# Patient Record
Sex: Female | Born: 1955 | Race: White | Hispanic: No
Health system: Southern US, Academic
[De-identification: ages and names within clinical notes are randomized; demographics above are authoritative.]

## PROBLEM LIST (undated history)

## (undated) ENCOUNTER — Encounter

## (undated) ENCOUNTER — Telehealth: Attending: Geriatric Medicine | Primary: Geriatric Medicine

## (undated) ENCOUNTER — Encounter: Attending: Nurse Practitioner | Primary: Nurse Practitioner

## (undated) ENCOUNTER — Ambulatory Visit: Payer: MEDICARE

## (undated) ENCOUNTER — Telehealth

## (undated) ENCOUNTER — Encounter: Attending: Pulmonary Disease | Primary: Pulmonary Disease

## (undated) ENCOUNTER — Ambulatory Visit
Payer: MEDICARE | Attending: Student in an Organized Health Care Education/Training Program | Primary: Student in an Organized Health Care Education/Training Program

## (undated) ENCOUNTER — Encounter: Attending: Family | Primary: Family

## (undated) ENCOUNTER — Ambulatory Visit

## (undated) ENCOUNTER — Encounter: Attending: Geriatric Medicine | Primary: Geriatric Medicine

## (undated) ENCOUNTER — Encounter: Attending: Medical Oncology | Primary: Medical Oncology

## (undated) ENCOUNTER — Telehealth: Attending: Pharmacist | Primary: Pharmacist

## (undated) ENCOUNTER — Encounter
Attending: Student in an Organized Health Care Education/Training Program | Primary: Student in an Organized Health Care Education/Training Program

## (undated) ENCOUNTER — Ambulatory Visit: Payer: MEDICARE | Attending: Psychiatry | Primary: Psychiatry

## (undated) ENCOUNTER — Encounter: Attending: Psychiatry | Primary: Psychiatry

## (undated) ENCOUNTER — Encounter: Attending: Oncology | Primary: Oncology

## (undated) ENCOUNTER — Telehealth: Attending: Medical Oncology | Primary: Medical Oncology

## (undated) ENCOUNTER — Encounter: Attending: Radiation Oncology | Primary: Radiation Oncology

## (undated) ENCOUNTER — Ambulatory Visit: Payer: MEDICARE | Attending: Physical Medicine & Rehabilitation | Primary: Physical Medicine & Rehabilitation

## (undated) ENCOUNTER — Other Ambulatory Visit

## (undated) ENCOUNTER — Telehealth
Attending: Student in an Organized Health Care Education/Training Program | Primary: Student in an Organized Health Care Education/Training Program

## (undated) ENCOUNTER — Telehealth: Attending: Pulmonary Disease | Primary: Pulmonary Disease

## (undated) ENCOUNTER — Encounter: Attending: Pharmacist | Primary: Pharmacist

## (undated) ENCOUNTER — Telehealth: Attending: Oncology | Primary: Oncology

## (undated) ENCOUNTER — Telehealth: Attending: Radiation Oncology | Primary: Radiation Oncology

## (undated) ENCOUNTER — Ambulatory Visit: Attending: Radiation Oncology | Primary: Radiation Oncology

## (undated) ENCOUNTER — Telehealth: Attending: Adult Health | Primary: Adult Health

## (undated) ENCOUNTER — Ambulatory Visit: Payer: Medicare (Managed Care) | Attending: Medical Oncology | Primary: Medical Oncology

## (undated) ENCOUNTER — Ambulatory Visit: Payer: MEDICARE | Attending: Geriatric Medicine | Primary: Geriatric Medicine

## (undated) ENCOUNTER — Encounter: Attending: Adult Health | Primary: Adult Health

## (undated) ENCOUNTER — Ambulatory Visit: Payer: Medicare (Managed Care)

## (undated) ENCOUNTER — Ambulatory Visit: Payer: MEDICAID

## (undated) ENCOUNTER — Ambulatory Visit: Payer: Medicaid (Managed Care)

## (undated) ENCOUNTER — Telehealth: Attending: Psychiatry | Primary: Psychiatry

## (undated) ENCOUNTER — Inpatient Hospital Stay

## (undated) ENCOUNTER — Telehealth: Attending: Clinical | Primary: Clinical

## (undated) ENCOUNTER — Telehealth: Payer: MEDICARE

## (undated) ENCOUNTER — Ambulatory Visit: Payer: MEDICARE | Attending: Orthopaedic Surgery | Primary: Orthopaedic Surgery

## (undated) ENCOUNTER — Telehealth: Attending: Internal Medicine | Primary: Internal Medicine

## (undated) ENCOUNTER — Encounter: Attending: Internal Medicine | Primary: Internal Medicine

## (undated) ENCOUNTER — Ambulatory Visit
Payer: Medicare (Managed Care) | Attending: Student in an Organized Health Care Education/Training Program | Primary: Student in an Organized Health Care Education/Training Program

## (undated) ENCOUNTER — Ambulatory Visit: Payer: MEDICARE | Attending: Adult Health | Primary: Adult Health

## (undated) ENCOUNTER — Encounter: Attending: Physical Medicine & Rehabilitation | Primary: Physical Medicine & Rehabilitation

## (undated) ENCOUNTER — Telehealth: Payer: MEDICARE | Attending: Psychiatry | Primary: Psychiatry

## (undated) ENCOUNTER — Telehealth: Attending: Physical Medicine & Rehabilitation | Primary: Physical Medicine & Rehabilitation

## (undated) ENCOUNTER — Ambulatory Visit: Payer: MEDICARE | Attending: Neurological Surgery | Primary: Neurological Surgery

## (undated) ENCOUNTER — Ambulatory Visit: Payer: MEDICARE | Attending: Diagnostic Radiology | Primary: Diagnostic Radiology

## (undated) ENCOUNTER — Encounter: Attending: Neurological Surgery | Primary: Neurological Surgery

## (undated) ENCOUNTER — Ambulatory Visit: Payer: Medicare (Managed Care) | Attending: Clinical | Primary: Clinical

## (undated) ENCOUNTER — Ambulatory Visit: Payer: Medicaid (Managed Care) | Attending: Internal Medicine | Primary: Internal Medicine

## (undated) ENCOUNTER — Ambulatory Visit: Payer: MEDICARE | Attending: Medical Oncology | Primary: Medical Oncology

## (undated) ENCOUNTER — Ambulatory Visit: Payer: MEDICARE | Attending: Radiation Oncology | Primary: Radiation Oncology

## (undated) ENCOUNTER — Ambulatory Visit: Attending: Physical Medicine & Rehabilitation | Primary: Physical Medicine & Rehabilitation

## (undated) DIAGNOSIS — G629 Polyneuropathy, unspecified: Secondary | ICD-10-CM

## (undated) DIAGNOSIS — F32A Depression, unspecified: Secondary | ICD-10-CM

## (undated) DIAGNOSIS — F419 Anxiety disorder, unspecified: Secondary | ICD-10-CM

## (undated) DIAGNOSIS — C801 Malignant (primary) neoplasm, unspecified: Secondary | ICD-10-CM

## (undated) DIAGNOSIS — D496 Neoplasm of unspecified behavior of brain: Secondary | ICD-10-CM

## (undated) DIAGNOSIS — C50919 Malignant neoplasm of unspecified site of unspecified female breast: Secondary | ICD-10-CM

## (undated) DIAGNOSIS — E785 Hyperlipidemia, unspecified: Secondary | ICD-10-CM

## (undated) DIAGNOSIS — F329 Major depressive disorder, single episode, unspecified: Secondary | ICD-10-CM

## (undated) HISTORY — DX: Major depressive disorder, single episode, unspecified: F32.9

## (undated) HISTORY — DX: Malignant (primary) neoplasm, unspecified: C80.1

## (undated) HISTORY — DX: Hyperlipidemia, unspecified: E78.5

## (undated) HISTORY — PX: TRIGGER FINGER RELEASE: SHX641

## (undated) HISTORY — PX: BILATERAL TOTAL MASTECTOMY WITH AXILLARY LYMPH NODE DISSECTION: SHX6364

## (undated) HISTORY — DX: Depression, unspecified: F32.A

## (undated) HISTORY — DX: Anxiety disorder, unspecified: F41.9

## (undated) HISTORY — PX: CARPAL TUNNEL RELEASE: SHX101

## (undated) MED ORDER — MAGNESIUM ORAL: Freq: Every day | ORAL | 0.00000 days

## (undated) MED ORDER — VITAMIN B-1 ORAL: Freq: Every day | ORAL | 0 days

## (undated) MED ORDER — SENNOSIDES 8.6 MG TABLET: Freq: Every day | ORAL | 0 days

## (undated) MED ORDER — BUPROPION HCL 75 MG TABLET: Freq: Two times a day (BID) | ORAL | 0.00000 days

---
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
## 1982-12-26 HISTORY — PX: ABDOMINAL HYSTERECTOMY: SHX81

## 2016-10-21 LAB — HM COLONOSCOPY

## 2017-02-03 ENCOUNTER — Other Ambulatory Visit: Payer: Self-pay | Admitting: Cardiology

## 2017-02-03 ENCOUNTER — Ambulatory Visit
Admission: RE | Admit: 2017-02-03 | Discharge: 2017-02-03 | Disposition: A | Discharge status: Home / Self Care | Payer: Medicaid Other | Source: Ambulatory Visit | Attending: Internal Medicine | Admitting: Internal Medicine

## 2017-02-03 ENCOUNTER — Ambulatory Visit
Admission: RE | Admit: 2017-02-03 | Discharge: 2017-02-03 | Disposition: A | Discharge status: Home / Self Care | Payer: Medicaid Other | Source: Ambulatory Visit | Attending: Cardiology | Admitting: Cardiology

## 2017-02-03 DIAGNOSIS — M25531 Pain in right wrist: Secondary | ICD-10-CM

## 2017-03-21 ENCOUNTER — Ambulatory Visit: Payer: Medicaid Other | Attending: Internal Medicine | Admitting: Physical Therapy

## 2017-03-21 ENCOUNTER — Encounter: Payer: Self-pay | Admitting: Physical Therapy

## 2017-03-21 DIAGNOSIS — M6281 Muscle weakness (generalized): Secondary | ICD-10-CM

## 2017-03-21 DIAGNOSIS — R262 Difficulty in walking, not elsewhere classified: Secondary | ICD-10-CM
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
## 2017-03-21 NOTE — Patient Instructions (Signed)
  ABDUCTION: Sitting - Exercise Ball: Resistance Band (Active)   Sit with feet flat. With band tied around both legs, Lift right leg slightly and, against resistance band, draw it out to side. Complete __2_ sets of __10_ repetitions. Perform _2__ sessions per day.  Copyright  VHI. All rights reserved.  FLEXION: Sitting - Resistance Band (Active)   Sit, both feet flat. Have band tied around both legs above knees, lift right knee toward ceiling.Repeat with other knee Complete _2__ sets of _10__ repetitions. Perform _2__ sessions per day.  http://gtsc.exer.us/21   Knee Extension: Resisted (Sitting)   With band looped around right ankle and under other foot, straighten leg with ankle loop. Keep other leg bent to increase resistance. Repeat _10___ times per set. Do __2__ sets per session. Do _2___ sessions per day.  http://orth.exer.us/691   Copyright  VHI. All rights reserved.   FLEXION: Sitting - Resistance Band (Active)   Sit with right foot flat. Have band tied around both feet, bend ankle, bringing toes toward head. Complete __2_ sets of __10_ repetitions. Perform _2__ sessions per day.  Copyright  VHI. All rights reserved.  Toe / Heel Raise (Sitting)   Sitting, raise heels, then rock back on heels and raise toes. Repeat _10___ times.  Copyright  VHI. All rights reserved.   Copyright  VHI. All rights reserved.  HIP / KNEE: Extension - Sit to Stand   Sitting, lean chest forward, raise hips up from surface. Straighten hips and knees. Weight bear equally on left and right sides. Backs of legs should not push off surface. __10_ reps per set, __2_ sets per day, _5__ days per week Use assistive device as needed.  Copyright  VHI. All rights reserved.

## 2017-05-11 ENCOUNTER — Emergency Department: Payer: Medicaid Other

## 2017-05-11 ENCOUNTER — Emergency Department
Admission: EM | Admit: 2017-05-11 | Discharge: 2017-05-11 | Disposition: A | Discharge status: Home / Self Care | Payer: Medicaid Other | Attending: Emergency Medicine | Admitting: Emergency Medicine

## 2017-05-11 DIAGNOSIS — S7002XA Contusion of left hip, initial encounter: Secondary | ICD-10-CM

## 2017-05-11 DIAGNOSIS — S098XXA Other specified injuries of head, initial encounter: Secondary | ICD-10-CM | POA: Diagnosis not present

## 2017-05-11 DIAGNOSIS — Y939 Activity, unspecified: Secondary | ICD-10-CM | POA: Diagnosis not present

## 2017-05-11 DIAGNOSIS — Y929 Unspecified place or not applicable: Secondary | ICD-10-CM | POA: Diagnosis not present

## 2017-05-11 DIAGNOSIS — F17200 Nicotine dependence, unspecified, uncomplicated: Secondary | ICD-10-CM | POA: Diagnosis not present

## 2017-05-11 DIAGNOSIS — W01198A Fall on same level from slipping, tripping and stumbling with subsequent striking against other object, initial encounter: Secondary | ICD-10-CM | POA: Diagnosis not present

## 2017-05-11 DIAGNOSIS — S0990XA Unspecified injury of head, initial encounter: Secondary | ICD-10-CM | POA: Diagnosis present

## 2017-05-11 DIAGNOSIS — Y999 Unspecified external cause status: Secondary | ICD-10-CM | POA: Insufficient documentation

## 2017-05-11 DIAGNOSIS — Z79899 Other long term (current) drug therapy: Secondary | ICD-10-CM | POA: Diagnosis not present

## 2017-05-11 DIAGNOSIS — W19XXXA Unspecified fall, initial encounter: Secondary | ICD-10-CM

## 2017-05-11 DIAGNOSIS — D0590 Unspecified type of carcinoma in situ of unspecified breast: Secondary | ICD-10-CM | POA: Insufficient documentation

## 2017-05-11 MED ORDER — ONDANSETRON 4 MG PO TBDP
ORAL_TABLET | ORAL | Status: AC
  Administered 2017-05-11: 4 mg via ORAL
  Filled 2017-05-11: qty 1

## 2017-05-11 MED ORDER — ONDANSETRON 4 MG PO TBDP
4.0000 mg | ORAL_TABLET | Freq: Once | ORAL | Status: AC
  Administered 2017-05-11: 4 mg via ORAL

## 2017-05-11 MED ORDER — OXYCODONE-ACETAMINOPHEN 5-325 MG PO TABS
ORAL_TABLET | ORAL | Status: AC
  Administered 2017-05-11: 1 via ORAL
  Filled 2017-05-11: qty 1

## 2017-05-11 MED ORDER — OXYCODONE-ACETAMINOPHEN 5-325 MG PO TABS
1.0000 | ORAL_TABLET | Freq: Once | ORAL | Status: AC
  Administered 2017-05-11: 1 via ORAL

## 2017-05-11 NOTE — ED Provider Notes (Signed)
[facility] Emergency Department Provider Note    First MD Initiated Contact with Patient 05/11/17 [phone]     (approximate)  I have reviewed the triage vital signs and the nursing notes.   HISTORY  Chief Complaint Fall    HPI Shavon Ashmore is a 61 y.o. female with [place] Meth of chronic medical conditions including metastatic breast cancer presents to the emergency department with history of falling tonight resulting in occipital head injury no loss of consciousness. Patient states that her chemotherapy results in difficulties with her balance and a such she's had multiple falls as a result. Patient also admits to having 2 glasses of wine tonight which is "normal for her". Patient admits to 8 out of 10 headache. Patient denies any weakness numbness gait instability or visual changes.   Past Medical History:  Diagnosis Date  . Anxiety   . Cancer [facility])    breast, metastatic, active  . Depression    still active; takes meds for depression;   . Hyperlipidemia    controllled with medication;     There are no active problems to display for this patient.   No past surgical history on file.  Prior to Admission medications   Medication Sig Start Date End Date Taking? Authorizing Provider  anastrozole (ARIMIDEX) 1 MG tablet Take 1 mg by mouth daily.    [provider]  clonazePAM (KLONOPIN) 1 MG tablet Take 1 mg by mouth 2 (two) times daily.    [provider]  DULoxetine (CYMBALTA) 20 MG capsule Take 20 mg by mouth daily.    [provider]  esomeprazole (NEXIUM) 40 MG capsule Take 40 mg by mouth daily at 12 noon.    [provider]  lisinopril-hydrochlorothiazide (PRINZIDE,ZESTORETIC) 20-12.5 MG tablet Take 1 tablet by mouth daily.    [provider]  prochlorperazine (COMPAZINE) 10 MG tablet Take 10 mg by mouth every 6 (six) hours as needed for nausea or vomiting.    [provider]  varenicline (CHANTIX) 0.5  MG tablet Take 0.5 mg by mouth 2 (two) times daily.    [provider]    Allergies Oxycodone; Tape; Tetracyclines & related; and Vicodin [hydrocodone-acetaminophen]  No family history on file.  Social History Social History  Substance Use Topics  . Smoking status: Current Every Day Smoker    Packs/day: 0.25  . Smokeless tobacco: Never Used  . Alcohol use Not on file    Review of Systems Constitutional: No fever/chills Eyes: No visual changes. ENT: No sore throat. Cardiovascular: Denies chest pain. Respiratory: Denies shortness of breath. Gastrointestinal: No abdominal pain.  No nausea, no vomiting.  No diarrhea.  No constipation. Genitourinary: Negative for dysuria. Musculoskeletal: Negative for neck pain.  Negative for back pain. Integumentary: Negative for rash. Neurological: Positive for headaches, negative for focal weakness or numbness.   ____________________________________________   PHYSICAL EXAM:  VITAL SIGNS: ED Triage Vitals  Enc Vitals Group     BP 05/11/17 0045 126/63     Pulse Rate 05/11/17 0045 79     Resp 05/11/17 0045 18     Temp 05/11/17 0045 97.5 F (36.4 C)     Temp Source 05/11/17 0045 Oral     SpO2 05/11/17 0045 99 %     Weight 05/11/17 0046 107 lb (48.5 kg)     Height 05/11/17 0046 5\' 2"  (1.575 m)     Head Circumference --      Peak Flow --  Pain Score 05/11/17 0044 2     Pain Loc --      Pain Edu? --      Excl. in [place]? --     Constitutional: Alert and oriented. Well appearing and in no acute distress. Eyes: Conjunctivae are normal. PERRL. EOMI. Head: Atraumatic. Nose: No congestion/rhinnorhea. Mouth/Throat: Mucous membranes are moist.  Oropharynx non-erythematous. Neck: No stridor.No cervical spine tenderness to palpation. Cardiovascular: Normal rate, regular rhythm. Good peripheral circulation. Grossly normal heart sounds. Respiratory: Normal respiratory effort.  No retractions. Lungs CTAB. Gastrointestinal: Soft and  nontender. No distention.  Musculoskeletal: No lower extremity tenderness nor edema. No gross deformities of extremities. Neurologic:  Normal speech and language. No gross focal neurologic deficits are appreciated.  Skin:  Bilateral knee ecchymoses and abrasions Psychiatric: Mood and affect are normal. Speech and behavior are normal.    RADIOLOGY I, [place], personally viewed and evaluated these images (plain radiographs) as part of my medical decision making, as well as reviewing the written report by the radiologist.  Ct Head Wo Contrast  Result Date: 05/11/2017 CLINICAL DATA:  Fall with impact to the posterior head EXAM: CT HEAD WITHOUT CONTRAST TECHNIQUE: Contiguous axial images were obtained from the base of the skull through the vertex without intravenous contrast. COMPARISON:  None. FINDINGS: Brain: No mass lesion, intraparenchymal hemorrhage or extra-axial collection. No evidence of acute cortical infarct. Brain parenchyma and CSF-containing spaces are normal for age. Vascular: No hyperdense vessel or unexpected calcification. Skull: Normal visualized skull base, calvarium and extracranial soft tissues. Sinuses/Orbits: No sinus fluid levels or advanced mucosal thickening. No mastoid effusion. Normal orbits. IMPRESSION: Normal head CT. Electronically Signed   By: Ulyses Jarred M.D.   On: 05/11/2017 01:18     Procedures   ____________________________________________   INITIAL IMPRESSION / ASSESSMENT AND PLAN / ED COURSE  Pertinent labs & imaging results that were available during my care of the patient were reviewed by me and considered in my medical decision making (see chart for details).        ____________________________________________  FINAL CLINICAL IMPRESSION(S) / ED DIAGNOSES  Final diagnoses:  Fall, initial encounter  Injury of head, initial encounter  Contusion of left hip, initial encounter     MEDICATIONS GIVEN DURING THIS VISIT:  Medications   ondansetron (ZOFRAN-ODT) disintegrating tablet 4 mg (4 mg Oral Given 05/11/17 0358)  oxyCODONE-acetaminophen (PERCOCET/ROXICET) 5-325 MG per tablet 1 tablet (1 tablet Oral Given 05/11/17 0358)     NEW OUTPATIENT MEDICATIONS STARTED DURING THIS VISIT:  New Prescriptions   No medications on file    Modified Medications   No medications on file    Discontinued Medications   No medications on file     Note:  This document was prepared using Dragon voice recognition software and may include unintentional dictation errors.    Gregor Hams, MD 05/13/17 [phone]

## 2017-05-11 NOTE — ED Triage Notes (Signed)
Pt in with co fall tonight hitting the back of her head no loc. Pt denies any other injury, did fall earlier in the week old bruising noted to right face. Saw Dr. Lavera Guise on Tuesday and no tests were done. Pt states chemo treatment makes her dizzy and off balance and that is expected per her oncologist. States she does not want to be checked out for dizziness, pt also had 2 glasses of wine tonight.

## 2017-06-26 MED ORDER — CLONAZEPAM 1 MG TABLET
ORAL_TABLET | Freq: Two times a day (BID) | ORAL | 0 refills | 0 days | Status: CP | PRN
Start: 2017-06-26 — End: 2017-09-21

## 2017-07-06 ENCOUNTER — Ambulatory Visit
Admission: RE | Admit: 2017-07-06 | Discharge: 2017-07-06 | Disposition: A | Discharge status: Home / Self Care | Payer: MEDICAID

## 2017-07-06 ENCOUNTER — Ambulatory Visit
Admission: RE | Admit: 2017-07-06 | Discharge: 2017-07-06 | Disposition: A | Discharge status: Home / Self Care | Payer: MEDICAID | Attending: Geriatric Medicine | Admitting: Geriatric Medicine

## 2017-07-06 DIAGNOSIS — C50811 Malignant neoplasm of overlapping sites of right female breast: Principal | ICD-10-CM

## 2017-07-06 DIAGNOSIS — F329 Major depressive disorder, single episode, unspecified: Secondary | ICD-10-CM

## 2017-07-06 DIAGNOSIS — Z170 Estrogen receptor positive status [ER+]: Secondary | ICD-10-CM

## 2017-07-06 DIAGNOSIS — F418 Other specified anxiety disorders: Principal | ICD-10-CM

## 2017-07-06 DIAGNOSIS — F419 Anxiety disorder, unspecified: Secondary | ICD-10-CM

## 2017-07-06 MED ORDER — PROCHLORPERAZINE MALEATE 10 MG TABLET
ORAL_TABLET | Freq: Four times a day (QID) | ORAL | 1 refills | 0 days | Status: CP | PRN
Start: 2017-07-06 — End: 2017-12-21

## 2017-07-06 MED ORDER — FAMOTIDINE 20 MG TABLET
ORAL_TABLET | Freq: Two times a day (BID) | ORAL | 1 refills | 0 days | Status: CP
Start: 2017-07-06 — End: 2018-03-24

## 2017-07-06 MED ORDER — ONDANSETRON HCL 4 MG TABLET
ORAL_TABLET | Freq: Every day | ORAL | 1 refills | 0.00000 days | Status: CP | PRN
Start: 2017-07-06 — End: 2018-07-06

## 2017-07-13 ENCOUNTER — Encounter: Payer: Self-pay | Admitting: Emergency Medicine

## 2017-07-13 ENCOUNTER — Emergency Department: Payer: Medicaid Other

## 2017-07-13 ENCOUNTER — Emergency Department
Admission: EM | Admit: 2017-07-13 | Discharge: 2017-07-13 | Disposition: A | Discharge status: Home / Self Care | Payer: Medicaid Other | Attending: Emergency Medicine | Admitting: Emergency Medicine

## 2017-07-13 DIAGNOSIS — W19XXXA Unspecified fall, initial encounter: Secondary | ICD-10-CM | POA: Insufficient documentation

## 2017-07-13 DIAGNOSIS — Y939 Activity, unspecified: Secondary | ICD-10-CM | POA: Insufficient documentation

## 2017-07-13 DIAGNOSIS — F17210 Nicotine dependence, cigarettes, uncomplicated: Secondary | ICD-10-CM | POA: Insufficient documentation

## 2017-07-13 DIAGNOSIS — Y92019 Unspecified place in single-family (private) house as the place of occurrence of the external cause: Secondary | ICD-10-CM | POA: Insufficient documentation

## 2017-07-13 DIAGNOSIS — S060X0A Concussion without loss of consciousness, initial encounter: Secondary | ICD-10-CM | POA: Insufficient documentation

## 2017-07-13 DIAGNOSIS — Y999 Unspecified external cause status: Secondary | ICD-10-CM | POA: Insufficient documentation

## 2017-07-13 DIAGNOSIS — Z79899 Other long term (current) drug therapy: Secondary | ICD-10-CM | POA: Diagnosis not present

## 2017-07-13 DIAGNOSIS — C50919 Malignant neoplasm of unspecified site of unspecified female breast: Secondary | ICD-10-CM | POA: Insufficient documentation

## 2017-07-13 DIAGNOSIS — C50911 Malignant neoplasm of unspecified site of right female breast: Secondary | ICD-10-CM

## 2017-07-13 DIAGNOSIS — S0990XA Unspecified injury of head, initial encounter: Secondary | ICD-10-CM | POA: Diagnosis present

## 2017-07-13 MED ORDER — PROMETHAZINE HCL 25 MG/ML IJ SOLN
25.0000 mg | Freq: Once | INTRAMUSCULAR | Status: AC
  Administered 2017-07-13: 25 mg via INTRAMUSCULAR
  Filled 2017-07-13: qty 1

## 2017-07-13 MED ORDER — KETOROLAC TROMETHAMINE 30 MG/ML IJ SOLN
30.0000 mg | Freq: Once | INTRAMUSCULAR | Status: AC
  Administered 2017-07-13: 30 mg via INTRAMUSCULAR
  Filled 2017-07-13: qty 1

## 2017-07-13 MED ORDER — DIPHENHYDRAMINE HCL 50 MG/ML IJ SOLN
50.0000 mg | Freq: Once | INTRAMUSCULAR | Status: AC
  Administered 2017-07-13: 50 mg via INTRAMUSCULAR
  Filled 2017-07-13: qty 1

## 2017-07-13 NOTE — ED Triage Notes (Signed)
Pt presents after falling at home today and hitting her head. She denies loc; states that she feels sleepy and that her headache is 10/10. Pt is receiving chemo for breast cancer. Pt alert & oriented with NAD noted.

## 2017-07-13 NOTE — ED Provider Notes (Signed)
[facility] Emergency Department Provider Note  ____________________________________________  Time seen: Approximately 5:43 PM  I have reviewed the triage vital signs and the nursing notes.   HISTORY  Chief Complaint Fall and Head Injury    HPI Barbara Malone is a 61 y.o. female Who presents to emergency department with her family member for complaint of head injury status post fall. Patient reports that she had been sleeping when she heard the phone ring. She got upntire event and denies any loss of consciousness. She endorses a severe left-sided headache. She endorses blurred vision and mild vertigo-like symptoms. Patient does have a history of breast cancer and is currently receiving chemotherapy for his cancer at [facility]. Patient reports that she has "chemo brain" but family member states that she has had a slight increase in confusion and short-term memory issues compared to baseline. Patient endorses some mild neck pain but states that she's been  Moving her neck appropriately. No loss consciousness since injury. No nausea or emesis. Patient reports having mild left shoulder bruising from fall. She endorses full range of motion to the left shoulder with no radicular symptoms down the left upper extremity. No back pain. No loss of bowel or bladder function. No medications prior to arrival.   Past Medical History:  Diagnosis Date  . Anxiety   . Cancer [facility])    breast, metastatic, active  . Depression    still active; takes meds for depression;   . Hyperlipidemia    controllled with medication;     There are no active problems to display for this patient.   History reviewed. No pertinent surgical history.  Prior to Admission medications   Medication Sig Start Date End Date Taking? Authorizing Provider  anastrozole (ARIMIDEX) 1 MG tablet Take 1 mg by mouth daily.    [provider]  clonazePAM (KLONOPIN) 1 MG tablet Take 1 mg by mouth 2  (two) times daily.    [provider]  DULoxetine (CYMBALTA) 20 MG capsule Take 20 mg by mouth daily.    [provider]  esomeprazole (NEXIUM) 40 MG capsule Take 40 mg by mouth daily at 12 noon.    [provider]  lisinopril-hydrochlorothiazide (PRINZIDE,ZESTORETIC) 20-12.5 MG tablet Take 1 tablet by mouth daily.    [provider]  prochlorperazine (COMPAZINE) 10 MG tablet Take 10 mg by mouth every 6 (six) hours as needed for nausea or vomiting.    [provider]  varenicline (CHANTIX) 0.5 MG tablet Take 0.5 mg by mouth 2 (two) times daily.    [provider]    Allergies Oxycodone; Tape; Tetracyclines & related; and Vicodin [hydrocodone-acetaminophen]  History reviewed. No pertinent family history.  Social History Social History  Substance Use Topics  . Smoking status: Current Every Day Smoker    Packs/day: 1.00  . Smokeless tobacco: Never Used  . Alcohol use 12.0 oz/week    20 Glasses of wine per week     Review of Systems  Constitutional: No fever/chills Eyes: positive for blurred vision ENT: No upper respiratory complaints. Cardiovascular: no chest pain. Respiratory: no cough. No SOB. Gastrointestinal: No abdominal pain.  No nausea, no vomiting.   Musculoskeletal: positive for mild left shoulder pain. Skin: Negative for rash, abrasions, lacerations, ecchymosis. Neurological: positive for severe left-sided headache but denies focal weakness or numbness. 10-point ROS otherwise negative.  ____________________________________________   PHYSICAL EXAM:  VITAL SIGNS: ED Triage Vitals  Enc Vitals Group     BP 07/13/17  1725 133/79     Pulse Rate 07/13/17 1725 80     Resp 07/13/17 1725 18     Temp 07/13/17 1725 98.9 F (37.2 C)     Temp Source 07/13/17 1725 Oral     SpO2 07/13/17 1725 97 %     Weight 07/13/17 1726 101 lb (45.8 kg)     Height 07/13/17 1726 5\' 1"  (1.549 m)     Head Circumference --      Peak Flow  --      Pain Score 07/13/17 1725 10     Pain Loc --      Pain Edu? --      Excl. in [place]? --      Constitutional: Alert and oriented. Well appearing and in no acute distress. Eyes: Conjunctivae are normal. PERRL. EOMI. Head: Atraumatic.no visible signs of trauma. No lacerations or abrasions. Patient is nontender to palpation of the osseous structures of the skull. No palpable abnormality. No battle signs. No raccoon eyes. no serosanguineous fluid drainage from the ears or nares. ENT:      Ears:       Nose: No congestion/rhinnorhea.      Mouth/Throat: Mucous membranes are moist.  Neck: No stridor.  Diffuse midline cervical spine tenderness to palpation. No point tenderness. No palpable abnormality. Radial pulse intact bilateral upper extremity's. Sensation intact equal bilateral upper extremities.  Cardiovascular: Normal rate, regular rhythm. Normal S1 and S2.  Good peripheral circulation. Respiratory: Normal respiratory effort without tachypnea or retractions. Lungs CTAB. Good air entry to the bases with no decreased or absent breath sounds. Musculoskeletal: Full range of motion to all extremities. No gross deformities appreciated. Neurologic:  Normal speech and language. No gross focal neurologic deficits are appreciated. Cranial nerves II through XII grossly intact. Patient does have mild short-term memory issue. Per family member, this is baseline but does appear slightly increased per family member. Skin:  Skin is warm, dry and intact. No rash noted. Psychiatric: Mood and affect are normal. Speech and behavior are normal. Patient exhibits appropriate insight and judgement.   ____________________________________________   LABS (all labs ordered are listed, but only abnormal results are displayed)  Labs Reviewed - No data to display ____________________________________________  EKG   ____________________________________________  RADIOLOGY Diamantina Providence Sitlaly Gudiel, personally  viewed and evaluated these images as part of my medical decision making, as well as reviewing the written report by the radiologist.  Ct Head Wo Contrast  Result Date: 07/13/2017 CLINICAL DATA:  Status post fall, head injury, headache. Current chemotherapy for breast cancer. EXAM: CT HEAD WITHOUT CONTRAST CT CERVICAL SPINE WITHOUT CONTRAST TECHNIQUE: Multidetector CT imaging of the head and cervical spine was performed following the standard protocol without intravenous contrast. Multiplanar CT image reconstructions of the cervical spine were also generated. COMPARISON:  None. FINDINGS: CT HEAD FINDINGS Brain: Generalized parenchymal atrophy with commensurate dilatation of the ventricles and sulci. There is no mass, hemorrhage, edema or other evidence of acute parenchymal abnormality. No extra-axial hemorrhage. Vascular: There are chronic calcified atherosclerotic changes of the large vessels at the skull base. No unexpected hyperdense vessel. Skull: Normal. Negative for fracture or focal lesion. Sinuses/Orbits: No acute finding. Other: None. CT CERVICAL SPINE FINDINGS Alignment: Levoscoliosis of the cervical spine, mild to moderate in degree. No evidence of acute vertebral body subluxation. Skull base and vertebrae: No fracture line or displaced fracture fragment identified. No acute or suspicious osseous lesion. Soft tissues and spinal canal: No prevertebral fluid or swelling. No visible  canal hematoma. Disc levels: Mild degenerative spurring amongst the posterior facets of the mid and lower cervical spine. Mild disc desiccation at the C5-6 level with associated spurring. No significant central canal stenosis at any level. Upper chest: Emphysematous changes at the lung apices. Other: Carotid and vertebral artery atherosclerosis. IMPRESSION: 1. No acute intracranial abnormality. No intracranial mass, hemorrhage or edema. No skull fracture. 2. No fracture or acute subluxation within the cervical spine.  Scoliosis. Mild degenerative change, as described above. 3. Mile biapical emphysematous change. 4. Carotid and vertebral artery atherosclerosis. Electronically Signed   By: Franki Cabot M.D.   On: 07/13/2017 18:30   Ct Cervical Spine Wo Contrast  Result Date: 07/13/2017 CLINICAL DATA:  Status post fall, head injury, headache. Current chemotherapy for breast cancer. EXAM: CT HEAD WITHOUT CONTRAST CT CERVICAL SPINE WITHOUT CONTRAST TECHNIQUE: Multidetector CT imaging of the head and cervical spine was performed following the standard protocol without intravenous contrast. Multiplanar CT image reconstructions of the cervical spine were also generated. COMPARISON:  None. FINDINGS: CT HEAD FINDINGS Brain: Generalized parenchymal atrophy with commensurate dilatation of the ventricles and sulci. There is no mass, hemorrhage, edema or other evidence of acute parenchymal abnormality. No extra-axial hemorrhage. Vascular: There are chronic calcified atherosclerotic changes of the large vessels at the skull base. No unexpected hyperdense vessel. Skull: Normal. Negative for fracture or focal lesion. Sinuses/Orbits: No acute finding. Other: None. CT CERVICAL SPINE FINDINGS Alignment: Levoscoliosis of the cervical spine, mild to moderate in degree. No evidence of acute vertebral body subluxation. Skull base and vertebrae: No fracture line or displaced fracture fragment identified. No acute or suspicious osseous lesion. Soft tissues and spinal canal: No prevertebral fluid or swelling. No visible canal hematoma. Disc levels: Mild degenerative spurring amongst the posterior facets of the mid and lower cervical spine. Mild disc desiccation at the C5-6 level with associated spurring. No significant central canal stenosis at any level. Upper chest: Emphysematous changes at the lung apices. Other: Carotid and vertebral artery atherosclerosis. IMPRESSION: 1. No acute intracranial abnormality. No intracranial mass, hemorrhage or  edema. No skull fracture. 2. No fracture or acute subluxation within the cervical spine. Scoliosis. Mild degenerative change, as described above. 3. Mile biapical emphysematous change. 4. Carotid and vertebral artery atherosclerosis. Electronically Signed   By: Franki Cabot M.D.   On: 07/13/2017 18:30    ____________________________________________    PROCEDURES  Procedure(s) performed:    Procedures    Medications  ketorolac (TORADOL) 30 MG/ML injection 30 mg (not administered)  promethazine (PHENERGAN) injection 25 mg (not administered)  diphenhydrAMINE (BENADRYL) injection 50 mg (not administered)     ____________________________________________   INITIAL IMPRESSION / ASSESSMENT AND PLAN / ED COURSE  Pertinent labs & imaging results that were available during my care of the patient were reviewed by me and considered in my medical decision making (see chart for details).  Review of the  [place] was performed in accordance of the [place] prior to dispensing any controlled drugs.     Patient's diagnosis is consistent with concussion sustained after a fall. Patient also has a known diagnosis of breast cancer. Patient suffered a fall at home striking the left side of her head on the floor. No loss of consciousness. Patient does have concussion symptoms. CT scans of the head and neck are ordered which returned with reassuring results with no indication of acute intracranial or osseous abnormality. Patient is given migraine cocktail emergency department.. Patient may take Tylenol and Motrin at home as needed. She  will follow up with primary care and oncology as needed. Patient is given ED precautions to return to the ED for any worsening or new symptoms.     ____________________________________________  FINAL CLINICAL IMPRESSION(S) / ED DIAGNOSES  Final diagnoses:  Concussion without loss of consciousness, initial encounter  Fall, initial encounter  Malignant neoplasm of right  female breast, unspecified estrogen receptor status, unspecified site of breast ([place])      NEW MEDICATIONS STARTED DURING THIS VISIT:  New Prescriptions   No medications on file        This chart was dictated using voice recognition software/Dragon. Despite best efforts to proofread, errors can occur which can change the meaning. Any change was purely unintentional.    Darletta Moll, PA-C 07/13/17 1843    Lisa Roca, MD 07/13/17 2118

## 2017-07-13 NOTE — ED Notes (Signed)
See triage note   states she was getting after a nap to answer the phone,fell hit her head and left shoulder  States she is a chemo pt  Last chemo was last week

## 2017-07-16 ENCOUNTER — Encounter: Payer: Self-pay | Admitting: Physical Therapy

## 2017-07-26 ENCOUNTER — Ambulatory Visit
Admission: RE | Admit: 2017-07-26 | Discharge: 2017-08-25 | Disposition: A | Discharge status: Home / Self Care | Payer: MEDICAID | Attending: Radiation Oncology | Admitting: Radiation Oncology

## 2017-07-26 ENCOUNTER — Ambulatory Visit
Admission: RE | Admit: 2017-07-26 | Discharge: 2017-08-25 | Disposition: A | Discharge status: Home / Self Care | Payer: MEDICAID

## 2017-07-26 DIAGNOSIS — C7931 Secondary malignant neoplasm of brain: Principal | ICD-10-CM

## 2017-07-26 DIAGNOSIS — C50911 Malignant neoplasm of unspecified site of right female breast: Secondary | ICD-10-CM

## 2017-07-27 ENCOUNTER — Ambulatory Visit
Admission: RE | Admit: 2017-07-27 | Discharge: 2017-07-27 | Disposition: A | Discharge status: Home / Self Care | Payer: MEDICAID

## 2017-07-27 DIAGNOSIS — C50811 Malignant neoplasm of overlapping sites of right female breast: Principal | ICD-10-CM

## 2017-08-01 ENCOUNTER — Ambulatory Visit
Admission: RE | Admit: 2017-08-01 | Discharge: 2017-08-01 | Disposition: A | Discharge status: Home / Self Care | Payer: MEDICAID

## 2017-08-01 DIAGNOSIS — R296 Repeated falls: Principal | ICD-10-CM

## 2017-08-01 DIAGNOSIS — C50811 Malignant neoplasm of overlapping sites of right female breast: Principal | ICD-10-CM

## 2017-08-01 DIAGNOSIS — G4700 Insomnia, unspecified: Secondary | ICD-10-CM

## 2017-08-01 DIAGNOSIS — Z170 Estrogen receptor positive status [ER+]: Secondary | ICD-10-CM

## 2017-08-01 DIAGNOSIS — G6289 Other specified polyneuropathies: Secondary | ICD-10-CM

## 2017-08-01 DIAGNOSIS — D7589 Other specified diseases of blood and blood-forming organs: Secondary | ICD-10-CM

## 2017-08-01 DIAGNOSIS — K219 Gastro-esophageal reflux disease without esophagitis: Secondary | ICD-10-CM

## 2017-08-01 DIAGNOSIS — Z1159 Encounter for screening for other viral diseases: Secondary | ICD-10-CM

## 2017-08-01 MED ORDER — TRAZODONE 50 MG TABLET
ORAL_TABLET | 5 refills | 0 days | Status: CP
Start: 2017-08-01 — End: 2017-08-25

## 2017-08-02 MED ORDER — ANASTROZOLE 1 MG TABLET
ORAL_TABLET | Freq: Every day | ORAL | 2 refills | 0.00000 days | Status: CP
Start: 2017-08-02 — End: 2017-10-30

## 2017-08-08 MED ORDER — DULOXETINE 20 MG CAPSULE,DELAYED RELEASE
ORAL_CAPSULE | Freq: Two times a day (BID) | ORAL | 3 refills | 0 days | Status: CP
Start: 2017-08-08 — End: 2017-08-25

## 2017-08-15 DIAGNOSIS — C7931 Secondary malignant neoplasm of brain: Principal | ICD-10-CM

## 2017-08-17 ENCOUNTER — Ambulatory Visit
Admission: RE | Admit: 2017-08-17 | Discharge: 2017-08-17 | Disposition: A | Discharge status: Home / Self Care | Payer: MEDICAID

## 2017-08-17 DIAGNOSIS — R110 Nausea: Secondary | ICD-10-CM

## 2017-08-17 DIAGNOSIS — C50811 Malignant neoplasm of overlapping sites of right female breast: Principal | ICD-10-CM

## 2017-08-19 MED ORDER — NICOTINE (POLACRILEX) 4 MG BUCCAL LOZENGE
0 refills | 0 days | Status: CP
Start: 2017-08-19 — End: 2017-11-06

## 2017-08-19 MED ORDER — NICOTINE 21 MG/24 HR DAILY TRANSDERMAL PATCH
MEDICATED_PATCH | TRANSDERMAL | 2 refills | 0 days | Status: CP
Start: 2017-08-19 — End: 2017-11-23

## 2017-08-21 MED ORDER — POTASSIUM CHLORIDE ER 10 MEQ TABLET,EXTENDED RELEASE
ORAL_TABLET | 0 refills | 0 days | Status: CP
Start: 2017-08-21 — End: 2017-09-04

## 2017-08-24 ENCOUNTER — Ambulatory Visit
Admission: RE | Admit: 2017-08-24 | Discharge: 2017-09-06 | Disposition: A | Discharge status: Home / Self Care | Payer: MEDICAID

## 2017-08-24 ENCOUNTER — Ambulatory Visit
Admission: RE | Admit: 2017-08-24 | Discharge: 2017-08-24 | Disposition: A | Discharge status: Home / Self Care | Payer: MEDICAID

## 2017-08-24 DIAGNOSIS — Z170 Estrogen receptor positive status [ER+]: Secondary | ICD-10-CM

## 2017-08-24 DIAGNOSIS — C50811 Malignant neoplasm of overlapping sites of right female breast: Principal | ICD-10-CM

## 2017-08-25 ENCOUNTER — Ambulatory Visit
Admission: RE | Admit: 2017-08-25 | Discharge: 2017-08-25 | Disposition: A | Discharge status: Home / Self Care | Payer: MEDICAID | Attending: Geriatric Medicine | Admitting: Geriatric Medicine

## 2017-08-25 ENCOUNTER — Ambulatory Visit
Admission: RE | Admit: 2017-08-25 | Discharge: 2017-08-25 | Disposition: A | Discharge status: Home / Self Care | Admitting: Physician Assistant

## 2017-08-25 ENCOUNTER — Ambulatory Visit
Admission: RE | Admit: 2017-08-25 | Discharge: 2017-08-25 | Disposition: A | Discharge status: Home / Self Care | Payer: MEDICAID | Attending: Neurological Surgery | Admitting: Neurological Surgery

## 2017-08-25 DIAGNOSIS — C7931 Secondary malignant neoplasm of brain: Principal | ICD-10-CM

## 2017-08-25 DIAGNOSIS — C50811 Malignant neoplasm of overlapping sites of right female breast: Principal | ICD-10-CM

## 2017-08-25 DIAGNOSIS — C50911 Malignant neoplasm of unspecified site of right female breast: Secondary | ICD-10-CM

## 2017-08-25 DIAGNOSIS — F329 Major depressive disorder, single episode, unspecified: Principal | ICD-10-CM

## 2017-08-25 DIAGNOSIS — F418 Other specified anxiety disorders: Secondary | ICD-10-CM

## 2017-08-25 DIAGNOSIS — S2231XD Fracture of one rib, right side, subsequent encounter for fracture with routine healing: Secondary | ICD-10-CM

## 2017-08-25 DIAGNOSIS — Z171 Estrogen receptor negative status [ER-]: Secondary | ICD-10-CM

## 2017-08-25 MED ORDER — HYDROCODONE 5 MG-ACETAMINOPHEN 325 MG TABLET: 1 | tablet | Freq: Three times a day (TID) | 0 refills | 0 days | Status: AC

## 2017-08-25 MED ORDER — LIDOCAINE 4 % TOPICAL PATCH
MEDICATED_PATCH | Freq: Every day | TRANSDERMAL | 0 refills | 0 days | Status: CP
Start: 2017-08-25 — End: 2018-09-23

## 2017-08-25 MED ORDER — HYDROCODONE 5 MG-ACETAMINOPHEN 325 MG TABLET
ORAL_TABLET | Freq: Three times a day (TID) | ORAL | 0 refills | 0.00000 days | Status: CP | PRN
Start: 2017-08-25 — End: 2017-08-25

## 2017-08-25 MED ORDER — DULOXETINE 30 MG CAPSULE,DELAYED RELEASE
ORAL_CAPSULE | Freq: Two times a day (BID) | ORAL | 3 refills | 0.00000 days | Status: CP
Start: 2017-08-25 — End: 2017-09-21

## 2017-08-25 NOTE — Unmapped (Signed)
Brief Nutrition Note  Brooke [name] is a 61 y.o. 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 Wt is fairly stable in the past 2 months Her wt is down 0.5 kg in 1 week, borderline significant. She reports having 2-6 loose BM's per day for which Dr. [name] prescribed Lomotil and she will start using it before meals as prescribed. She has been drinking <2 Boost Plus and eating ~1 larger meal per day with some snacks. Drinking 12 oz cola/ day and ~32 oz water per day  ??  Nutrition Intervention  1. Strategies for managing diarrhea discussed with pt (handout provided)  2. Pt is encouraged to drink Esnure Plus/ Boost Plus TID as tolerated  --Samples of Ensure Plus and Rx/Policy for [facility] Nutrition Supplement Program provided today  ??  [address] [place], [place], [place]  Pager [phone]

## 2017-08-29 ENCOUNTER — Ambulatory Visit
Admission: RE | Admit: 2017-08-29 | Discharge: 2017-09-24 | Disposition: A | Discharge status: Home / Self Care | Payer: MEDICAID | Attending: Radiation Oncology | Admitting: Radiation Oncology

## 2017-08-29 ENCOUNTER — Ambulatory Visit
Admission: RE | Admit: 2017-08-29 | Discharge: 2017-09-24 | Disposition: A | Discharge status: Home / Self Care | Payer: MEDICAID

## 2017-08-29 DIAGNOSIS — C50911 Malignant neoplasm of unspecified site of right female breast: Secondary | ICD-10-CM

## 2017-08-29 DIAGNOSIS — C7931 Secondary malignant neoplasm of brain: Principal | ICD-10-CM

## 2017-09-04 MED ORDER — POTASSIUM CHLORIDE ER 10 MEQ TABLET,EXTENDED RELEASE
ORAL_TABLET | 1 refills | 0 days | Status: CP
Start: 2017-09-04 — End: 2017-10-04

## 2017-09-05 DIAGNOSIS — C7931 Secondary malignant neoplasm of brain: Principal | ICD-10-CM

## 2017-09-11 MED ORDER — NYSTATIN 100,000 UNIT/ML ORAL SUSPENSION
Freq: Four times a day (QID) | ORAL | 0 refills | 0.00000 days | Status: CP
Start: 2017-09-11 — End: 2018-05-25

## 2017-09-14 ENCOUNTER — Ambulatory Visit
Admission: RE | Admit: 2017-09-14 | Discharge: 2017-09-14 | Disposition: A | Discharge status: Home / Self Care | Payer: MEDICAID

## 2017-09-14 DIAGNOSIS — R110 Nausea: Principal | ICD-10-CM

## 2017-09-14 DIAGNOSIS — C50811 Malignant neoplasm of overlapping sites of right female breast: Secondary | ICD-10-CM

## 2017-09-14 DIAGNOSIS — Z171 Estrogen receptor negative status [ER-]: Secondary | ICD-10-CM

## 2017-09-16 ENCOUNTER — Emergency Department: Payer: Medicaid Other

## 2017-09-16 ENCOUNTER — Emergency Department
Admission: EM | Admit: 2017-09-16 | Discharge: 2017-09-16 | Disposition: A | Discharge status: Home / Self Care | Payer: Medicaid Other | Attending: Emergency Medicine | Admitting: Emergency Medicine

## 2017-09-16 DIAGNOSIS — S42211A Unspecified displaced fracture of surgical neck of right humerus, initial encounter for closed fracture: Secondary | ICD-10-CM | POA: Insufficient documentation

## 2017-09-16 DIAGNOSIS — Y9389 Activity, other specified: Secondary | ICD-10-CM | POA: Diagnosis not present

## 2017-09-16 DIAGNOSIS — F17210 Nicotine dependence, cigarettes, uncomplicated: Secondary | ICD-10-CM | POA: Insufficient documentation

## 2017-09-16 DIAGNOSIS — W19XXXA Unspecified fall, initial encounter: Secondary | ICD-10-CM | POA: Diagnosis not present

## 2017-09-16 DIAGNOSIS — Y92010 Kitchen of single-family (private) house as the place of occurrence of the external cause: Secondary | ICD-10-CM | POA: Diagnosis not present

## 2017-09-16 DIAGNOSIS — Z79899 Other long term (current) drug therapy: Secondary | ICD-10-CM | POA: Insufficient documentation

## 2017-09-16 DIAGNOSIS — Y998 Other external cause status: Secondary | ICD-10-CM | POA: Insufficient documentation

## 2017-09-16 DIAGNOSIS — S4991XA Unspecified injury of right shoulder and upper arm, initial encounter: Secondary | ICD-10-CM | POA: Diagnosis present

## 2017-09-16 DIAGNOSIS — C50919 Malignant neoplasm of unspecified site of unspecified female breast: Secondary | ICD-10-CM | POA: Insufficient documentation

## 2017-09-16 LAB — CBC WITH DIFFERENTIAL/PLATELET
Basophils Absolute: 0.1 10*3/uL (ref 0–0.1)
Basophils Relative: 1 %
EOS PCT: 0 %
Eosinophils Absolute: 0 10*3/uL (ref 0–0.7)
HEMATOCRIT: 38.6 % (ref 35.0–47.0)
Hemoglobin: 13.6 g/dL (ref 12.0–16.0)
LYMPHS ABS: 2 10*3/uL (ref 1.0–3.6)
Lymphocytes Relative: 34 %
MCH: 35.9 pg — AB (ref 26.0–34.0)
MCHC: 35.1 g/dL (ref 32.0–36.0)
MCV: 102.3 fL — AB (ref 80.0–100.0)
MONO ABS: 0.4 10*3/uL (ref 0.2–0.9)
Monocytes Relative: 6 %
NEUTROS ABS: 3.4 10*3/uL (ref 1.4–6.5)
Neutrophils Relative %: 59 %
PLATELETS: 167 10*3/uL (ref 150–440)
RBC: 3.77 MIL/uL — AB (ref 3.80–5.20)
RDW: 13 % (ref 11.5–14.5)
WBC: 5.8 10*3/uL (ref 3.6–11.0)

## 2017-09-16 LAB — BASIC METABOLIC PANEL
Anion gap: 12 (ref 5–15)
CO2: 22 mmol/L (ref 22–32)
Calcium: 8.4 mg/dL — ABNORMAL LOW (ref 8.9–10.3)
Chloride: 97 mmol/L — ABNORMAL LOW (ref 101–111)
Creatinine, Ser: 0.47 mg/dL (ref 0.44–1.00)
GFR calc Af Amer: >60 mL/min (ref >60)
GLUCOSE: 98 mg/dL (ref 65–99)
POTASSIUM: 3.5 mmol/L (ref 3.5–5.1)
Sodium: 131 mmol/L — ABNORMAL LOW (ref 135–145)

## 2017-09-16 MED ORDER — FENTANYL CITRATE (PF) 100 MCG/2ML IJ SOLN
50.0000 ug | Freq: Once | INTRAMUSCULAR | Status: AC
  Administered 2017-09-16: 50 ug via INTRAVENOUS
  Filled 2017-09-16: qty 2

## 2017-09-16 NOTE — ED Provider Notes (Signed)
[facility] Emergency Department Provider Note   ____________________________________________   I have reviewed the triage vital signs and the nursing notes.   HISTORY  Chief Complaint Right shoulder pain  History limited by: Not Limited   HPI Barbara Malone is a 61 y.o. female who presents to the emergency department today via EMS because of right shoulder pain after a fall. The patient has a history of metastatic breast cancer and is currently undergoing treatment. She states that the treatment does make her dizzy. She was in the kitchen bending over at the fridge when she became busy. She fell backwards and landed on her right shoulder. She denies hitting her head. Since that time she has had severe pain in the right shoulder. Movement of the shoulder makes the pain worse. She denies any associated numbness or tingling of the arm.    Past Medical History:  Diagnosis Date  . Anxiety   . Cancer [facility])    breast, metastatic, active  . Depression    still active; takes meds for depression;   . Hyperlipidemia    controllled with medication;     There are no active problems to display for this patient.   History reviewed. No pertinent surgical history.  Prior to Admission medications   Medication Sig Start Date End Date Taking? Authorizing Provider  anastrozole (ARIMIDEX) 1 MG tablet Take 1 mg by mouth daily.    [provider]  clonazePAM (KLONOPIN) 1 MG tablet Take 1 mg by mouth 2 (two) times daily.    [provider]  DULoxetine (CYMBALTA) 20 MG capsule Take 20 mg by mouth daily.    [provider]  esomeprazole (NEXIUM) 40 MG capsule Take 40 mg by mouth daily at 12 noon.    [provider]  lisinopril-hydrochlorothiazide (PRINZIDE,ZESTORETIC) 20-12.5 MG tablet Take 1 tablet by mouth daily.    [provider]  prochlorperazine (COMPAZINE) 10 MG tablet Take 10 mg by mouth every 6 (six) hours as needed for nausea  or vomiting.    [provider]  varenicline (CHANTIX) 0.5 MG tablet Take 0.5 mg by mouth 2 (two) times daily.    [provider]    Allergies Oxycodone; Tape; Tetracyclines & related; and Vicodin [hydrocodone-acetaminophen]  History reviewed. No pertinent family history.  Social History Social History  Substance Use Topics  . Smoking status: Current Every Day Smoker    Packs/day: 1.00  . Smokeless tobacco: Never Used  . Alcohol use 12.0 oz/week    20 Glasses of wine per week    Review of Systems Constitutional: No fever/chills Eyes: No visual changes. ENT: No sore throat. Cardiovascular: Denies chest pain. Respiratory: Denies shortness of breath. Gastrointestinal: No abdominal pain.  No nausea, no vomiting.  No diarrhea.   Genitourinary: Negative for dysuria. Musculoskeletal: Positive for right shoulder pain.  Skin: Negative for rash. Neurological: Positive for dizziness.  ____________________________________________   PHYSICAL EXAM:  VITAL SIGNS: ED Triage Vitals  Enc Vitals Group     BP 09/16/17 1524 106/74     Pulse Rate 09/16/17 1524 71     Resp 09/16/17 1524 18     Temp 09/16/17 1524 98.4 F (36.9 C)     Temp Source 09/16/17 1524 Oral     SpO2 09/16/17 1524 99 %     Weight 09/16/17 1523 101 lb 9.6 oz (46.1 kg)     Height 09/16/17 1523 5\' 3"  (1.6 m)     Head Circumference --  Peak Flow --      Pain Score 09/16/17 1518 10   Constitutional: Alert and oriented. Well appearing and in no distress. Eyes: Conjunctivae are normal.  ENT   Head: Normocephalic and atraumatic.   Nose: No congestion/rhinnorhea.   Mouth/Throat: Mucous membranes are moist.   Neck: No stridor. Hematological/Lymphatic/Immunilogical: No cervical lymphadenopathy. Cardiovascular: Normal rate, regular rhythm.  No murmurs, rubs, or gallops.  Respiratory: Normal respiratory effort without tachypnea nor retractions. Breath sounds are clear and equal  bilaterally. No wheezes/rales/rhonchi. Gastrointestinal: Soft and non tender. No rebound. No guarding.  Genitourinary: Deferred Musculoskeletal: No obvious deformity to the right shoulder. Tender to palpation and manipulation. Neurologic:  Normal speech and language. No gross focal neurologic deficits are appreciated.  Skin:  Skin is warm, dry and intact. No rash noted. Psychiatric: Mood and affect are normal. Speech and behavior are normal. Patient exhibits appropriate insight and judgment.  ____________________________________________    LABS (pertinent positives/negatives)  Na 131 Hgb 13.6 WBC 5.9  ____________________________________________   EKG  I, Nance Pear, attending physician, personally viewed and interpreted this EKG  EKG Time: 1527 Rate: 71 Rhythm: sinus rhythm Axis: normal Intervals: qtc 437 QRS: narrow ST changes: no st elevation Impression: normal ekg   ____________________________________________    RADIOLOGY  Right shoulder Fracture of the right humeral neck  ____________________________________________   PROCEDURES  Procedures  ____________________________________________   INITIAL IMPRESSION / ASSESSMENT AND PLAN / ED COURSE  Pertinent labs & imaging results that were available during my care of the patient were reviewed by me and considered in my medical decision making (see chart for details).  patient presented to the emergency department today after a fall complaining of right shoulder pain. Differential includes rotator cuff injury, fracture, dislocation. X-rays do show a fracture. Patient has Norco at home for pain. Will give patient orthopedic follow-up appointment and immobilizer.  ____________________________________________   FINAL CLINICAL IMPRESSION(S) / ED DIAGNOSES  Final diagnoses:  Fall, initial encounter  Closed displaced fracture of surgical neck of right humerus, unspecified fracture morphology, initial  encounter     Note: This dictation was prepared with Dragon dictation. Any transcriptional errors that result from this process are unintentional     Nance Pear, MD 09/16/17 1719

## 2017-09-16 NOTE — Discharge Instructions (Signed)
Please seek medical attention for any high fevers, chest pain, shortness of breath, change in behavior, persistent vomiting, bloody stool or any other new or concerning symptoms.  

## 2017-09-16 NOTE — ED Triage Notes (Signed)
Pt to ED from home via [facility] c/o fall. Per EMS pt fell on right shoulder. Pt reports feeling dizzy prior to fall. EMS reports administering 40 mcg of fentanyl. Pt alert and oriented in no acute distress at this time.

## 2017-09-16 NOTE — ED Notes (Signed)
Shoulder immobilizer placed on pt.

## 2017-09-18 ENCOUNTER — Emergency Department
Admission: EM | Admit: 2017-09-18 | Discharge: 2017-09-18 | Disposition: A | Discharge status: Home / Self Care | Source: Intra-hospital | Attending: Emergency Medicine | Admitting: Emergency Medicine

## 2017-09-18 ENCOUNTER — Emergency Department
Admission: EM | Admit: 2017-09-18 | Discharge: 2017-09-18 | Disposition: A | Discharge status: Home / Self Care | Payer: MEDICAID | Source: Intra-hospital

## 2017-09-18 DIAGNOSIS — M25511 Pain in right shoulder: Principal | ICD-10-CM

## 2017-09-21 MED ORDER — CLONAZEPAM 1 MG TABLET
ORAL_TABLET | Freq: Two times a day (BID) | ORAL | 0 refills | 0.00000 days | PRN
Start: 2017-09-21 — End: 2017-10-19

## 2017-09-21 MED ORDER — DULOXETINE 30 MG CAPSULE,DELAYED RELEASE
ORAL_CAPSULE | Freq: Two times a day (BID) | ORAL | 3 refills | 0 days | Status: CP
Start: 2017-09-21 — End: 2017-11-23

## 2017-09-26 ENCOUNTER — Ambulatory Visit
Admission: RE | Admit: 2017-09-26 | Discharge: 2017-09-26 | Disposition: A | Discharge status: Home / Self Care | Payer: MEDICAID

## 2017-09-26 DIAGNOSIS — T148XXA Other injury of unspecified body region, initial encounter: Principal | ICD-10-CM

## 2017-09-28 ENCOUNTER — Ambulatory Visit
Admission: RE | Admit: 2017-09-28 | Discharge: 2017-09-28 | Disposition: A | Discharge status: Home / Self Care | Payer: MEDICAID

## 2017-09-28 ENCOUNTER — Ambulatory Visit
Admission: RE | Admit: 2017-09-28 | Discharge: 2017-09-28 | Disposition: A | Discharge status: Home / Self Care | Payer: MEDICAID | Attending: Geriatric Medicine | Admitting: Geriatric Medicine

## 2017-09-28 DIAGNOSIS — C50811 Malignant neoplasm of overlapping sites of right female breast: Secondary | ICD-10-CM

## 2017-09-28 DIAGNOSIS — C7931 Secondary malignant neoplasm of brain: Secondary | ICD-10-CM

## 2017-09-28 DIAGNOSIS — W19XXXD Unspecified fall, subsequent encounter: Secondary | ICD-10-CM

## 2017-09-28 DIAGNOSIS — F419 Anxiety disorder, unspecified: Secondary | ICD-10-CM

## 2017-09-28 DIAGNOSIS — Z170 Estrogen receptor positive status [ER+]: Secondary | ICD-10-CM

## 2017-09-28 DIAGNOSIS — F329 Major depressive disorder, single episode, unspecified: Principal | ICD-10-CM

## 2017-09-28 DIAGNOSIS — Z171 Estrogen receptor negative status [ER-]: Secondary | ICD-10-CM

## 2017-09-28 DIAGNOSIS — F418 Other specified anxiety disorders: Secondary | ICD-10-CM

## 2017-09-28 DIAGNOSIS — R110 Nausea: Principal | ICD-10-CM

## 2017-09-28 DIAGNOSIS — F1010 Alcohol abuse, uncomplicated: Secondary | ICD-10-CM

## 2017-09-28 MED ORDER — HYDROCODONE 5 MG-ACETAMINOPHEN 325 MG TABLET
ORAL_TABLET | Freq: Three times a day (TID) | ORAL | 0 refills | 0.00000 days | Status: CP | PRN
Start: 2017-09-28 — End: 2018-05-31

## 2017-09-30 ENCOUNTER — Ambulatory Visit
Admission: RE | Admit: 2017-09-30 | Discharge: 2017-09-30 | Disposition: A | Discharge status: Home / Self Care | Payer: MEDICAID

## 2017-09-30 DIAGNOSIS — C50811 Malignant neoplasm of overlapping sites of right female breast: Principal | ICD-10-CM

## 2017-10-04 MED ORDER — POTASSIUM CHLORIDE ER 10 MEQ TABLET,EXTENDED RELEASE
ORAL_TABLET | 0 refills | 0 days | Status: CP
Start: 2017-10-04 — End: 2017-10-24

## 2017-10-06 ENCOUNTER — Ambulatory Visit
Admission: RE | Admit: 2017-10-06 | Discharge: 2017-10-06 | Disposition: A | Discharge status: Home / Self Care | Payer: MEDICAID

## 2017-10-06 DIAGNOSIS — C50811 Malignant neoplasm of overlapping sites of right female breast: Secondary | ICD-10-CM

## 2017-10-06 DIAGNOSIS — R110 Nausea: Principal | ICD-10-CM

## 2017-10-09 MED ORDER — DIPHENOXYLATE-ATROPINE 2.5 MG-0.025 MG TABLET
ORAL_TABLET | Freq: Four times a day (QID) | ORAL | 0 refills | 0 days | Status: CP | PRN
Start: 2017-10-09 — End: 2017-11-23

## 2017-10-19 MED ORDER — CLONAZEPAM 1 MG TABLET
ORAL_TABLET | Freq: Two times a day (BID) | ORAL | 0 refills | 0.00000 days | Status: CP | PRN
Start: 2017-10-19 — End: 2017-12-22

## 2017-10-24 ENCOUNTER — Ambulatory Visit
Admission: RE | Admit: 2017-10-24 | Discharge: 2017-10-24 | Disposition: A | Discharge status: Home / Self Care | Payer: MEDICAID

## 2017-10-24 DIAGNOSIS — S42201D Unspecified fracture of upper end of right humerus, subsequent encounter for fracture with routine healing: Principal | ICD-10-CM

## 2017-10-24 MED ORDER — POTASSIUM CHLORIDE ER 10 MEQ TABLET,EXTENDED RELEASE
ORAL_TABLET | 0 refills | 0 days | Status: CP
Start: 2017-10-24 — End: 2017-11-06

## 2017-10-26 ENCOUNTER — Ambulatory Visit
Admission: RE | Admit: 2017-10-26 | Discharge: 2017-10-26 | Disposition: A | Discharge status: Home / Self Care | Payer: MEDICAID

## 2017-10-26 DIAGNOSIS — C50811 Malignant neoplasm of overlapping sites of right female breast: Secondary | ICD-10-CM

## 2017-10-26 DIAGNOSIS — R110 Nausea: Principal | ICD-10-CM

## 2017-10-30 MED ORDER — ANASTROZOLE 1 MG TABLET
ORAL_TABLET | Freq: Every day | ORAL | 0 refills | 0.00000 days | Status: CP
Start: 2017-10-30 — End: 2017-11-24

## 2017-11-06 ENCOUNTER — Ambulatory Visit: Admission: RE | Admit: 2017-11-06 | Discharge: 2017-11-06 | Payer: MEDICAID

## 2017-11-06 DIAGNOSIS — R2689 Other abnormalities of gait and mobility: Secondary | ICD-10-CM

## 2017-11-06 DIAGNOSIS — C50919 Malignant neoplasm of unspecified site of unspecified female breast: Secondary | ICD-10-CM

## 2017-11-06 DIAGNOSIS — Z9181 History of falling: Secondary | ICD-10-CM

## 2017-11-06 DIAGNOSIS — R531 Weakness: Principal | ICD-10-CM

## 2017-11-06 MED ORDER — POTASSIUM CHLORIDE ER 10 MEQ TABLET,EXTENDED RELEASE
ORAL_TABLET | 0 refills | 0 days | Status: CP
Start: 2017-11-06 — End: 2017-12-25

## 2017-11-23 ENCOUNTER — Ambulatory Visit
Admission: RE | Admit: 2017-11-23 | Discharge: 2017-11-23 | Disposition: A | Discharge status: Home / Self Care | Payer: MEDICAID | Attending: Adult Health | Admitting: Adult Health

## 2017-11-23 ENCOUNTER — Ambulatory Visit
Admission: RE | Admit: 2017-11-23 | Discharge: 2017-11-23 | Disposition: A | Discharge status: Home / Self Care | Admitting: Physician Assistant

## 2017-11-23 ENCOUNTER — Ambulatory Visit
Admission: RE | Admit: 2017-11-23 | Discharge: 2017-11-23 | Disposition: A | Discharge status: Home / Self Care | Payer: MEDICAID

## 2017-11-23 ENCOUNTER — Ambulatory Visit
Admission: RE | Admit: 2017-11-23 | Discharge: 2017-11-23 | Disposition: A | Discharge status: Home / Self Care | Payer: MEDICAID | Attending: Registered" | Admitting: Registered"

## 2017-11-23 DIAGNOSIS — R110 Nausea: Principal | ICD-10-CM

## 2017-11-23 DIAGNOSIS — C7931 Secondary malignant neoplasm of brain: Secondary | ICD-10-CM

## 2017-11-23 DIAGNOSIS — Z713 Dietary counseling and surveillance: Principal | ICD-10-CM

## 2017-11-23 DIAGNOSIS — F1010 Alcohol abuse, uncomplicated: Secondary | ICD-10-CM

## 2017-11-23 DIAGNOSIS — C50811 Malignant neoplasm of overlapping sites of right female breast: Secondary | ICD-10-CM

## 2017-11-23 DIAGNOSIS — F418 Other specified anxiety disorders: Secondary | ICD-10-CM

## 2017-11-23 DIAGNOSIS — R197 Diarrhea, unspecified: Principal | ICD-10-CM

## 2017-11-23 DIAGNOSIS — Z170 Estrogen receptor positive status [ER+]: Secondary | ICD-10-CM

## 2017-11-23 DIAGNOSIS — F419 Anxiety disorder, unspecified: Secondary | ICD-10-CM

## 2017-11-23 DIAGNOSIS — F329 Major depressive disorder, single episode, unspecified: Principal | ICD-10-CM

## 2017-11-23 MED ORDER — DULOXETINE 30 MG CAPSULE,DELAYED RELEASE
ORAL_CAPSULE | Freq: Every day | ORAL | 3 refills | 0.00000 days | Status: CP
Start: 2017-11-23 — End: 2018-07-02

## 2017-11-23 MED ORDER — LOPERAMIDE 2 MG CAPSULE
ORAL_CAPSULE | Freq: Four times a day (QID) | ORAL | 2 refills | 0 days | Status: SS | PRN
Start: 2017-11-23 — End: 2019-06-17

## 2017-11-23 MED ORDER — VARENICLINE 0.5 MG TABLET
ORAL_TABLET | 2 refills | 0 days | Status: CP
Start: 2017-11-23 — End: 2018-09-26

## 2017-11-23 MED ORDER — DIPHENOXYLATE-ATROPINE 2.5 MG-0.025 MG TABLET
ORAL_TABLET | Freq: Four times a day (QID) | ORAL | 2 refills | 0 days | Status: CP | PRN
Start: 2017-11-23 — End: 2018-04-11

## 2017-11-24 MED ORDER — ANASTROZOLE 1 MG TABLET
ORAL_TABLET | Freq: Every day | ORAL | 11 refills | 0 days | Status: CP
Start: 2017-11-24 — End: 2018-12-10

## 2017-11-30 ENCOUNTER — Encounter: Payer: Self-pay | Admitting: Physical Therapy

## 2017-11-30 ENCOUNTER — Other Ambulatory Visit: Payer: Self-pay

## 2017-11-30 ENCOUNTER — Ambulatory Visit: Payer: Medicaid Other | Attending: Specialist | Admitting: Physical Therapy

## 2017-11-30 VITALS — BP 113/74 | HR 102

## 2017-11-30 DIAGNOSIS — R2681 Unsteadiness on feet: Secondary | ICD-10-CM | POA: Diagnosis present

## 2017-11-30 DIAGNOSIS — M6281 Muscle weakness (generalized): Secondary | ICD-10-CM

## 2017-11-30 DIAGNOSIS — Z9181 History of falling: Secondary | ICD-10-CM | POA: Diagnosis present

## 2017-11-30 NOTE — Therapy (Signed)
[place] MAIN [facility] SERVICES [address] [place], [place], [address] Phone: [phone]   Fax:  [phone]  Physical Therapy Evaluation  Patient Details  Name: Barbara Malone MRN: [id number] Date of Birth: 08/03/1956 Referring Provider: Dr. Arby Barrette   Encounter Date: 11/30/2017  PT End of Session - 11/30/17 1131    Visit Number  1    Number of Visits  4    Date for PT Re-Evaluation  12/21/17    Authorization Type  Medicaid    PT Start Time  0901    PT Stop Time  0957    PT Time Calculation (min)  56 min    Equipment Utilized During Treatment  Gait belt    Activity Tolerance  Patient tolerated treatment well;Patient limited by fatigue    Behavior During Therapy  [facility] for tasks assessed/performed       Past Medical History:  Diagnosis Date  . Anxiety   . Cancer [facility])    breast, metastatic, active  . Depression    still active; takes meds for depression;   . Hyperlipidemia    controllled with medication;     History reviewed. No pertinent surgical history.  Vitals:   11/30/17 0903  BP: 113/74  Pulse: (!) 102     Subjective Assessment - 11/30/17 0904    Subjective  Pt presenting for evaluation and treatment of imbalance and generalized weakness since beginning chemo 2 years prior.     Pertinent History  Pt reports she has been having chemo for two years and has lost so much weight and has not had much of an appetite causing her to become weak with frequent falls.  Pt reports 3 falls in the past 6 months, she has not fallen in 2 months since she broke her R arm, she is cautious because she does not want to fall again.  Pt reports she does experience dizziness/lightheadedness with position changes which typically resolves if she takes her time.  She finds herself tripping over objects and over her feet.  Pt reports she uses a SPC to ambulate if she is feeling well.  She has a WC and RW.  Pt does not use her WC.  Pt uses her RW when she  feels more tired. Pt requires assist with bathing and dressing from sister who lives nearby.  Pt lives alone.  Pt does cook.  Sister assists with the cleaning.  Pt is able to drive but only drives locally.  Pt reports if she bends to pick something up she keeps going forward, she has trouble getting off the couch, getting OOB.  Pt showers independently sitting down on a shower seat. The pt has a h/o metastatic breast cancer and is currently undergoing chemo treatment. She is not currently receiving radiation treatment. She reports neuropathy in her feet, toes, and hands. Pt with R proximal humeral fx on 09/16/17 and presents in sling this date. Per most recent Ortho note the pt was seen on 10/30 for routine ongoing followup regarding R humeral fx. Radiographs on this date showed interval healing and pt was advised to start weaning herself out of the sling over the next several weeks and working on pendulum and gentle shoulder and elbow ROM. Plan is to follow up with Ortho MD in 4-6 wks; however pt reports she believes her next Ortho appointment is sometime at the start of Jan 2019. Per pt the MD requested that her PT take ROM measurements of her R shoulder  for the pt to report back to the MD but do nothing further. The pt is unable to specify PROM vs AROM so the shoulder was not addressed today. Following brain MRI in August 2018 showing 7 small lesions the pt underwent radiation treatment for brain mets.     Limitations  Lifting;Standing;Walking;House hold activities    How long can you sit comfortably?  "it depends on the surface"    How long can you stand comfortably?  15 minutes    How long can you walk comfortably?  15 minutes    Diagnostic tests  X-ray of shoulder on 10/24/17: "Radiographs on this date showed interval healing".  MRI of brain (due to falls) August, 2018: 7 small ring-enhancing lesions.      Patient Stated Goals  "to get my legs and arms strong"     Currently in Pain?  Yes    Pain Score   6     Pain Location  Shoulder    Pain Orientation  Right    Pain Descriptors / Indicators  Aching    Pain Type  Surgical pain    Pain Onset  More than a month ago    Multiple Pain Sites  Yes    Pain Score  5    Pain Location  Wrist    Pain Orientation  Right    Pain Descriptors / Indicators  Aching    Pain Onset  More than a month ago    Pain Score  10    Pain Location  Head Headache    Pain Descriptors / Indicators  Headache    Pain Onset  More than a month ago         [facility] PT Assessment - 11/30/17 0917      Assessment   Medical Diagnosis  Generalized weakness and balance issues due to poor nutrition in the setting of met breast CA.  History of falls.     Referring Provider  Dr. Arby Barrette    Onset Date/Surgical Date  12/01/15    Hand Dominance  Right    Next MD Visit  Dec 20th, 2018 when her oncologist.  Pt believes her next Ortho appointment is in the first of January 2019.     Prior Therapy  No      Precautions   Precautions  Fall;Shoulder;Other (comment)    Type of Shoulder Precautions  Pt unable to specify.  See Subjective for information found from chart review    Precaution Comments  Pt has port on L side of chest    Required Braces or Orthoses  Sling per chart review pt can start weaning from sling      Restrictions   Weight Bearing Restrictions  Yes    RUE Weight Bearing  Non weight bearing pt reported      Balance Screen   Has the patient fallen in the past 6 months  Yes    How many times?  3    Has the patient had a decrease in activity level because of a fear of falling?   Yes    Is the patient reluctant to leave their home because of a fear of falling?   No      Home Environment   Living Environment  Private residence    Living Arrangements  Alone    Available Help at Discharge  Family;Available PRN/intermittently    Type of [place] entrance    Home  Layout  One level    [place] - single point;Walker - 4  wheels;Walker - 2 wheels Grab bars      Prior Function   Level of Independence  Needs assistance with ADLs;Needs assistance with homemaking;Independent with household mobility with device    Vocation  On disability      Cognition   Overall Cognitive Status  Impaired/Different from baseline    Area of Impairment  Memory    Memory  Decreased short-term memory pt attributes this to treatment from cancer      Observation/Other Assessments   Skin Integrity  has port on left side chest;       ROM / Strength   AROM / PROM / Strength  Strength      AROM   Overall AROM   Deficits      Strength   Overall Strength  Deficits    Strength Assessment Site  Shoulder;Elbow;Hip;Knee;Ankle    Right/Left Shoulder  Left    Left Shoulder Flexion  4/5    Left Shoulder ABduction  3/5    Right/Left Elbow  Left    Left Elbow Flexion  5/5    Left Elbow Extension  4/5    Right/Left Hip  Left;Right    Right Hip Flexion  3/5    Right Hip External Rotation   3/5    Right Hip Internal Rotation  4/5    Right Hip ABduction  3+/5    Right Hip ADduction  4/5    Left Hip Flexion  3/5    Left Hip External Rotation  3/5    Left Hip Internal Rotation  3+/5    Left Hip ABduction  3+/5    Left Hip ADduction  4/5    Right/Left Knee  Left;Right    Right Knee Flexion  3+/5    Right Knee Extension  5/5    Left Knee Flexion  3+/5    Left Knee Extension  3+/5    Right/Left Ankle  Right;Left    Right Ankle Dorsiflexion  3-/5    Left Ankle Dorsiflexion  3-/5      EXAMINATION   Pulse at rest at start of session 95-103. With activity/examination pulse up to 110.    Outcome measures were completed and results explained to the patient (all outcome measures performed without AD):  ABC Scale: 23.12%  5xSTS: 24. 42 seconds  10mT: 0.80 m/s  TUG: 21.06 seconds   Sensation: Neuropathy in feet, toes, hands. Occasionally has sensation that her feet are burning.   Posture: Flexed posture in sitting and standing,  sacral sitting, RUE in sling   Gait Analysis (without AD): Dec Bil DF, hip E. Flexed and very guarded posture.      Objective measurements completed on examination: See above findings.     TREATMENT  Sit<>stand 2x5 with LUE support to push up for stability and power (added to HEP)  Pt reports BLE fatigue at end of session following examination and HEP.           PT Education - 11/30/17 1048    Education provided  Yes    Education Details  POC, role of PT, findings of Evaluation, HEP, call made to her PCP who will be in contact with the pt if there are any concerns regarding her elevated pulse rate at rest    Person(s) Educated  Patient    Methods  Explanation;Demonstration;Verbal cues;Handout    Comprehension  Verbalized understanding;Returned demonstration;Need further instruction;Verbal cues  required       PT Short Term Goals - 11/30/17 1134      PT SHORT TERM GOAL #1   Title  Pt will be independent with HEP for carryover between sessions    Time  2    Period  Weeks    Status  New        PT Long Term Goals - 11/30/17 1135      PT LONG TERM GOAL #1   Title  BLE strength will improve to at least 4+/5 throughout BLEs for improved strength and functional mobility    Baseline  See Evaluation note    Time  6    Period  Weeks    Status  New      PT LONG TERM GOAL #2   Title  Pt's 5xSTS will improve to at least 16 seconds to demonsrate improved balance and BLE strength    Baseline  24.42 seconds with use of LUE    Time  4    Period  Weeks    Status  New      PT LONG TERM GOAL #3   Title  Pt will improve her 62mT time to at least 1.0 m/s to demonstrate improved gait speed for ambulation in the community    Baseline  0.80 m/s    Time  4    Period  Weeks    Status  New      PT LONG TERM GOAL #4   Title  Pt will improve her TUG time by at least 8 seconds to demonstrate improved balance     Baseline  21.06 seconds    Time  4    Period  Weeks    Status  New       PT LONG TERM GOAL #5   Title  Pt's ABC score will improve by at least 20% to demonstrate pt's improved confidence in her balance    Baseline  3    Period  Weeks    Status  New             Plan - 11/30/17 1104    Clinical Impression Statement  Pt is a 61y/o F who presents with BLE weakness and a h/o falling since beginning chemo treatment ~2 years prior.  Her reported impaired balance and strength is confirmed by her 5xSTS, TUG, and MMT.  She presents with a decreased gait speed on her 181m compared to her age and gender matched norms.  Her self reported perception of balance deficits was significant as evidenced by her ABC score.  Pt instructed in sit<>stand exercises as her HEP.  Pt reported fatigue at end of session following examination and HEP. Will monitor pt's response to each session and adjust intensity as needed. Pt will benefit from skilled PT interventions for improved balance and strength and improved QOL.  Pulse as high as 103 at rest at start of session.  This PT called pt's PCP per pt's permission to notify PCP of pt's elevated pulse rate at rest.  The receptionist left a message for the pt's PCP who will follow up with the patient if needed.     History and Personal Factors relevant to plan of care:  --    Clinical Presentation  Unstable    Clinical Presentation due to:  Pt has multiple co-morbidities contributing to presentation to PT: met breast cancer currently undergoing chemo, healing R humeral fracture    Clinical Decision Making  Moderate  Rehab Potential  Fair    PT Frequency  1x / week    PT Duration  3 weeks    PT Treatment/Interventions  ADLs/Self Care Home Management;Aquatic Therapy;Cryotherapy;Electrical Stimulation;Iontophoresis 26m/ml Dexamethasone;Moist Heat;Ultrasound;Fluidtherapy;Contrast Bath;DME Instruction;Gait training;Stair training;Functional mobility training;Therapeutic activities;Therapeutic exercise;Balance training;Neuromuscular  re-education;Cognitive remediation;Patient/family education;Orthotic Fit/Training;Wheelchair mobility training;Manual techniques;Compression bandaging;Scar mobilization;Passive range of motion;Dry needling;Energy conservation;Splinting;Taping    PT Next Visit Plan  Berg Balance Test, progress strengthening program and HEP, introduced balance interventions, gait training    PT Home Exercise Plan  sit<>stand     Recommended Other Services  none at this time    Consulted and Agree with Plan of Care  Patient       Patient will benefit from skilled therapeutic intervention in order to improve the following deficits and impairments:  Abnormal gait, Decreased activity tolerance, Decreased balance, Decreased cognition, Decreased endurance, Decreased knowledge of precautions, Decreased knowledge of use of DME, Decreased mobility, Decreased range of motion, Decreased safety awareness, Decreased scar mobility, Decreased strength, Difficulty walking, Dizziness, Hypomobility, Increased fascial restricitons, Increased muscle spasms, Impaired perceived functional ability, Impaired flexibility, Impaired sensation, Impaired UE functional use, Impaired vision/preception, Improper body mechanics, Postural dysfunction, Pain  Visit Diagnosis: Muscle weakness (generalized)  Unsteadiness on feet  History of falling     Problem List There are no active problems to display for this patient.   ACollie SiadPT, DPT 11/30/2017, 11:57 AM  [place] [facility] CenterSERV[facility] [address] [place] [address]Phone: [phone]  Fax:  [phone] Name: Barbara KeittMRN: [id number] of Birth: [birth year]

## 2017-12-05 ENCOUNTER — Ambulatory Visit: Payer: Medicaid Other

## 2017-12-07 ENCOUNTER — Emergency Department
Admission: EM | Admit: 2017-12-07 | Discharge: 2017-12-07 | Discharge status: Against Medical Advice | Payer: MEDICAID | Source: Intra-hospital | Attending: Emergency Medicine | Admitting: Emergency Medicine

## 2017-12-08 ENCOUNTER — Inpatient Hospital Stay
Admission: RE | Admit: 2017-12-08 | Discharge: 2017-12-09 | Disposition: A | Discharge status: Home Health | Payer: MEDICAID

## 2017-12-08 ENCOUNTER — Inpatient Hospital Stay
Admission: RE | Admit: 2017-12-08 | Discharge: 2017-12-09 | Disposition: A | Discharge status: Home Health | Payer: MEDICAID | Admitting: Physician Assistant

## 2017-12-08 ENCOUNTER — Inpatient Hospital Stay
Admission: RE | Admit: 2017-12-08 | Discharge: 2017-12-09 | Disposition: A | Discharge status: Home Health | Payer: MEDICAID | Attending: Adult Health | Admitting: Adult Health

## 2017-12-08 DIAGNOSIS — F418 Other specified anxiety disorders: Secondary | ICD-10-CM

## 2017-12-08 DIAGNOSIS — Z170 Estrogen receptor positive status [ER+]: Secondary | ICD-10-CM

## 2017-12-08 DIAGNOSIS — A09 Infectious gastroenteritis and colitis, unspecified: Principal | ICD-10-CM

## 2017-12-08 DIAGNOSIS — C50811 Malignant neoplasm of overlapping sites of right female breast: Principal | ICD-10-CM

## 2017-12-08 DIAGNOSIS — C7931 Secondary malignant neoplasm of brain: Secondary | ICD-10-CM

## 2017-12-08 DIAGNOSIS — F1010 Alcohol abuse, uncomplicated: Principal | ICD-10-CM

## 2017-12-08 DIAGNOSIS — F329 Major depressive disorder, single episode, unspecified: Secondary | ICD-10-CM

## 2017-12-09 DIAGNOSIS — A09 Infectious gastroenteritis and colitis, unspecified: Principal | ICD-10-CM

## 2017-12-12 ENCOUNTER — Ambulatory Visit: Payer: Medicaid Other

## 2017-12-13 MED ORDER — ESOMEPRAZOLE MAGNESIUM 40 MG CAPSULE,DELAYED RELEASE
ORAL_CAPSULE | 0 refills | 0 days | Status: CP
Start: 2017-12-13 — End: 2018-01-09

## 2017-12-14 ENCOUNTER — Ambulatory Visit
Admission: RE | Admit: 2017-12-14 | Discharge: 2017-12-14 | Disposition: A | Discharge status: Home / Self Care | Payer: MEDICAID

## 2017-12-14 DIAGNOSIS — C50811 Malignant neoplasm of overlapping sites of right female breast: Secondary | ICD-10-CM

## 2017-12-14 DIAGNOSIS — R110 Nausea: Principal | ICD-10-CM

## 2017-12-21 ENCOUNTER — Ambulatory Visit: Payer: Medicaid Other

## 2017-12-21 MED ORDER — PROCHLORPERAZINE MALEATE 10 MG TABLET
ORAL_TABLET | Freq: Four times a day (QID) | ORAL | 1 refills | 0.00000 days | Status: CP | PRN
Start: 2017-12-21 — End: ?

## 2017-12-22 MED ORDER — CLONAZEPAM 1 MG TABLET
ORAL_TABLET | Freq: Two times a day (BID) | ORAL | 0 refills | 0 days | Status: CP | PRN
Start: 2017-12-22 — End: 2018-01-19

## 2017-12-26 HISTORY — PX: BRAIN SURGERY: SHX531

## 2017-12-27 MED ORDER — POTASSIUM CHLORIDE ER 10 MEQ TABLET,EXTENDED RELEASE
ORAL_TABLET | 0 refills | 0 days | Status: CP
Start: 2017-12-27 — End: 2018-02-06

## 2017-12-29 ENCOUNTER — Ambulatory Visit: Admit: 2017-12-29 | Discharge: 2017-12-29 | Payer: MEDICARE

## 2017-12-29 ENCOUNTER — Ambulatory Visit: Admit: 2017-12-29 | Discharge: 2017-12-29 | Payer: MEDICARE | Attending: Adult Health | Primary: Adult Health

## 2017-12-29 DIAGNOSIS — Z170 Estrogen receptor positive status [ER+]: Secondary | ICD-10-CM

## 2017-12-29 DIAGNOSIS — F1010 Alcohol abuse, uncomplicated: Principal | ICD-10-CM

## 2017-12-29 DIAGNOSIS — C50811 Malignant neoplasm of overlapping sites of right female breast: Principal | ICD-10-CM

## 2017-12-29 DIAGNOSIS — C7931 Secondary malignant neoplasm of brain: Secondary | ICD-10-CM

## 2017-12-29 DIAGNOSIS — F329 Major depressive disorder, single episode, unspecified: Secondary | ICD-10-CM

## 2017-12-29 DIAGNOSIS — F418 Other specified anxiety disorders: Secondary | ICD-10-CM

## 2017-12-29 MED ORDER — ACAMPROSATE 333 MG TABLET,DELAYED RELEASE
ORAL_TABLET | Freq: Three times a day (TID) | ORAL | 1 refills | 0.00000 days | Status: CP
Start: 2017-12-29 — End: 2018-01-22

## 2018-01-03 ENCOUNTER — Ambulatory Visit: Admit: 2018-01-03 | Discharge: 2018-01-03 | Payer: MEDICARE

## 2018-01-03 DIAGNOSIS — S42201D Unspecified fracture of upper end of right humerus, subsequent encounter for fracture with routine healing: Principal | ICD-10-CM

## 2018-01-04 ENCOUNTER — Ambulatory Visit: Admit: 2018-01-04 | Discharge: 2018-01-05 | Payer: MEDICARE

## 2018-01-04 ENCOUNTER — Ambulatory Visit: Admit: 2018-01-04 | Discharge: 2018-01-05 | Payer: MEDICARE | Attending: Adult Health | Primary: Adult Health

## 2018-01-04 DIAGNOSIS — R110 Nausea: Principal | ICD-10-CM

## 2018-01-04 DIAGNOSIS — C50811 Malignant neoplasm of overlapping sites of right female breast: Secondary | ICD-10-CM

## 2018-01-09 MED ORDER — ESOMEPRAZOLE MAGNESIUM 40 MG CAPSULE,DELAYED RELEASE
ORAL_CAPSULE | 0 refills | 0 days | Status: CP
Start: 2018-01-09 — End: 2018-01-11

## 2018-01-11 MED ORDER — ESOMEPRAZOLE MAGNESIUM 40 MG CAPSULE,DELAYED RELEASE
ORAL_CAPSULE | Freq: Every day | ORAL | 2 refills | 0 days | Status: CP
Start: 2018-01-11 — End: 2018-03-26

## 2018-01-19 MED ORDER — CLONAZEPAM 1 MG TABLET
ORAL_TABLET | Freq: Two times a day (BID) | ORAL | 0 refills | 0 days | Status: CP | PRN
Start: 2018-01-19 — End: 2018-03-12

## 2018-01-22 MED ORDER — ACAMPROSATE 333 MG TABLET,DELAYED RELEASE
ORAL_TABLET | Freq: Three times a day (TID) | ORAL | 1 refills | 0.00000 days | Status: CP
Start: 2018-01-22 — End: 2018-03-29

## 2018-01-25 ENCOUNTER — Ambulatory Visit: Admit: 2018-01-25 | Discharge: 2018-01-25 | Payer: MEDICARE

## 2018-01-25 DIAGNOSIS — C50811 Malignant neoplasm of overlapping sites of right female breast: Secondary | ICD-10-CM

## 2018-01-25 DIAGNOSIS — R110 Nausea: Principal | ICD-10-CM

## 2018-02-06 IMAGING — CT CT HEAD W/O CM
3 series · 16 of 45 positions shown, 19 images · non-contrast
Comparison: None.

CLINICAL DATA: Fall with impact to the posterior head

EXAM:
CT HEAD WITHOUT CONTRAST
TECHNIQUE: Contiguous axial images were obtained from the base of the skull
through the vertex without intravenous contrast.

[Series 3: head wo · axial · 0.40mm/px · z∈[-77,+38]mm · 10 of 28 slices shown, 13 images]
[im 3/28  brain]
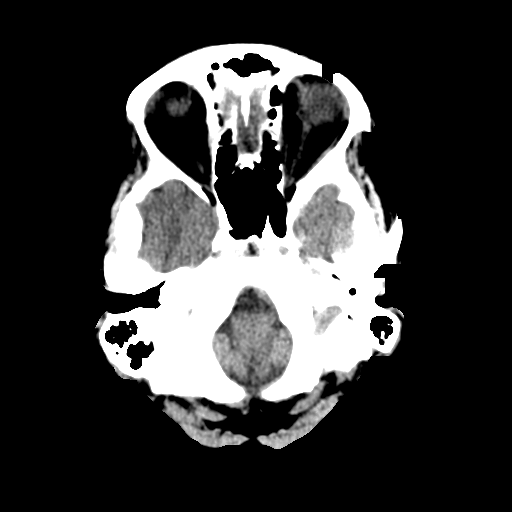
[im 3/28  bone]
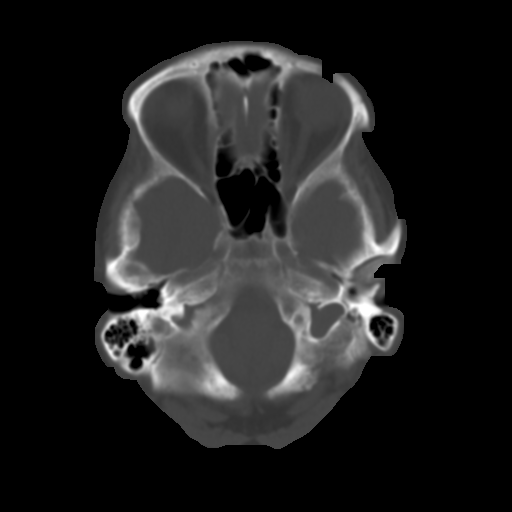
[im 5/28  brain]
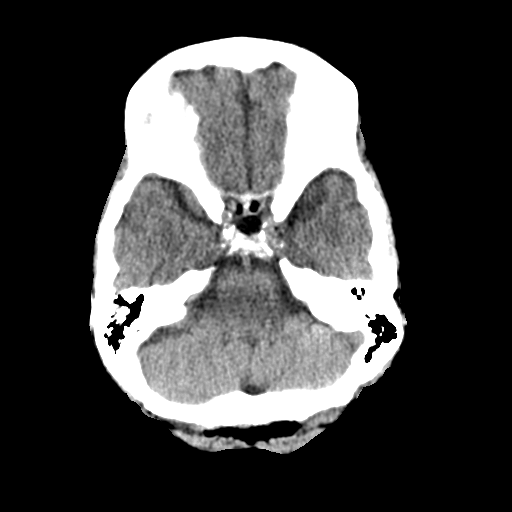
[im 8/28  brain]
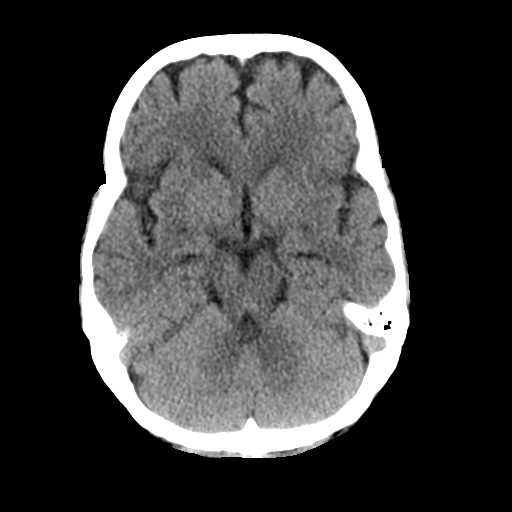
[im 11/28  brain]
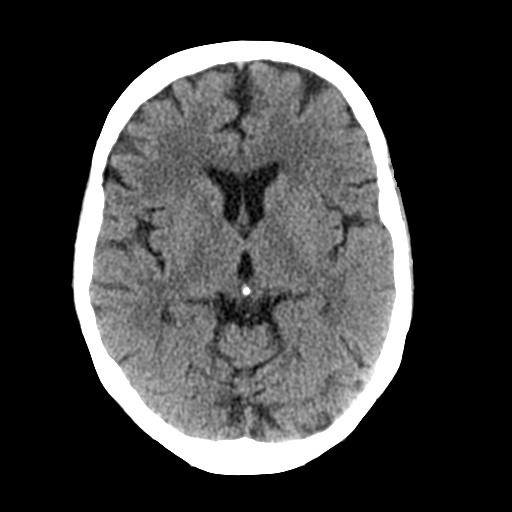
[im 13/28  brain]
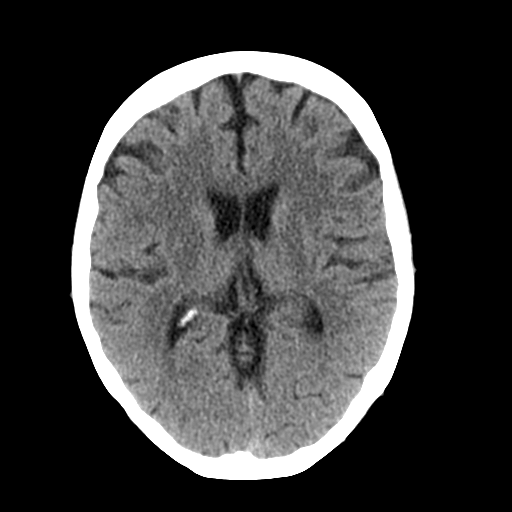
[im 13/28  bone]
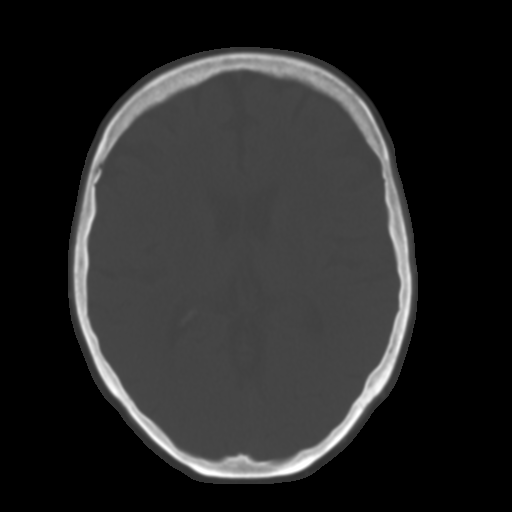
[im 16/28  brain]
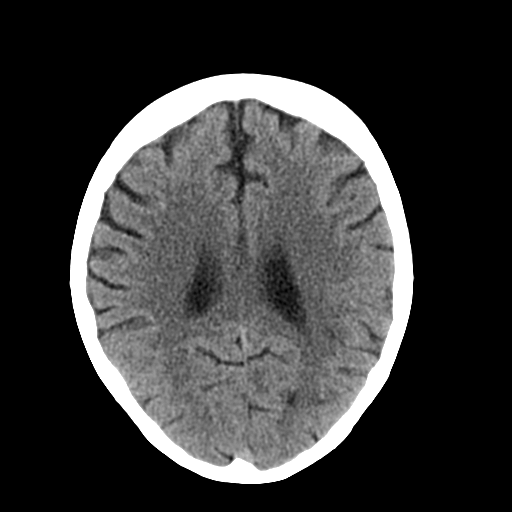
[im 18/28  brain]
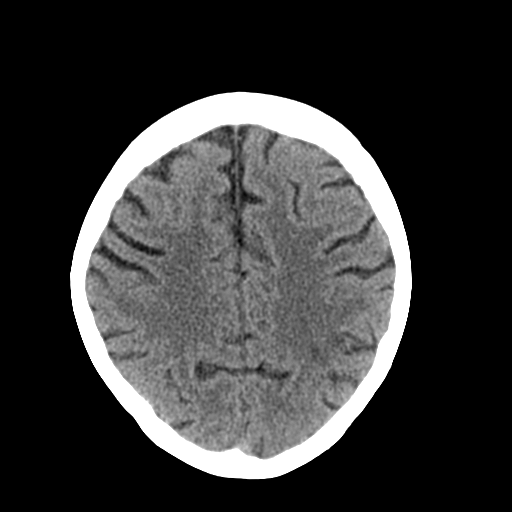
[im 21/28  brain]
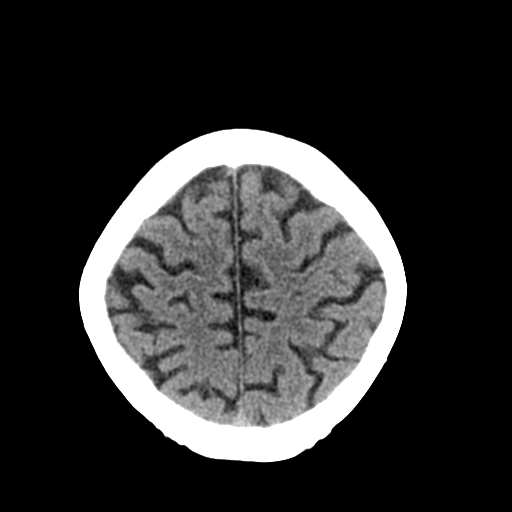
[im 24/28  brain]
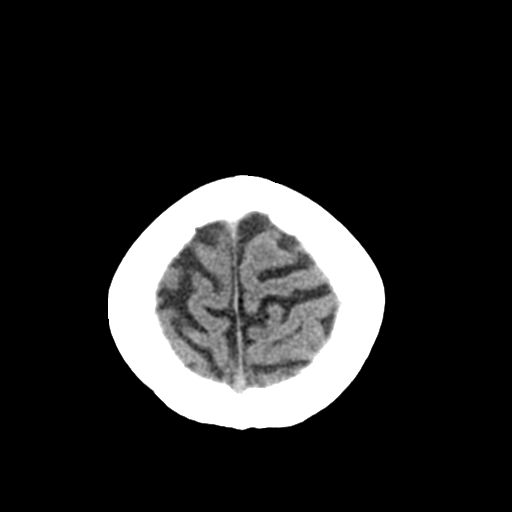
[im 24/28  bone]
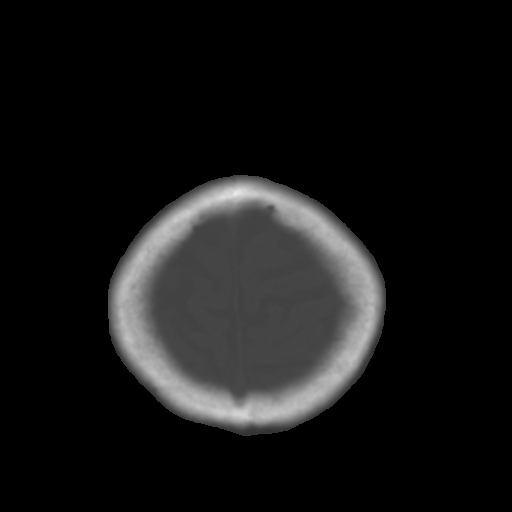
[im 26/28  brain]
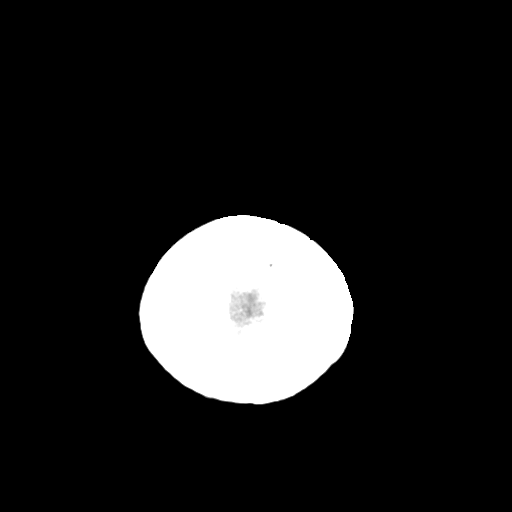

[Series 4: coronal soft tissue · coronal · 0.29mm/px · 3 of 60 slices shown]
[im 20/60  brain]
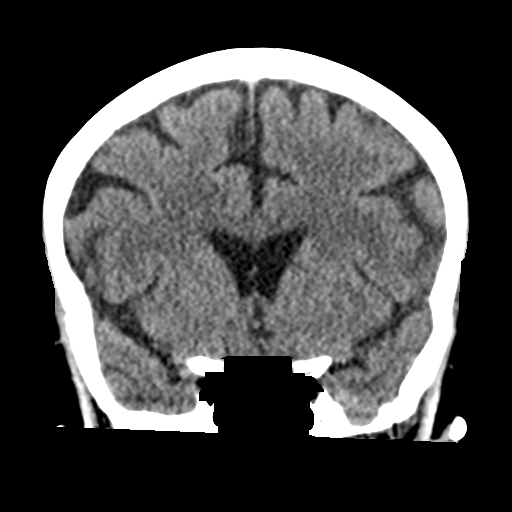
[im 27/60  brain]
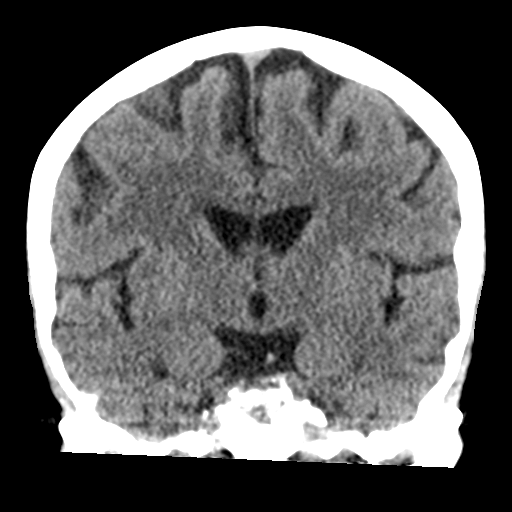
[im 33/60  brain]
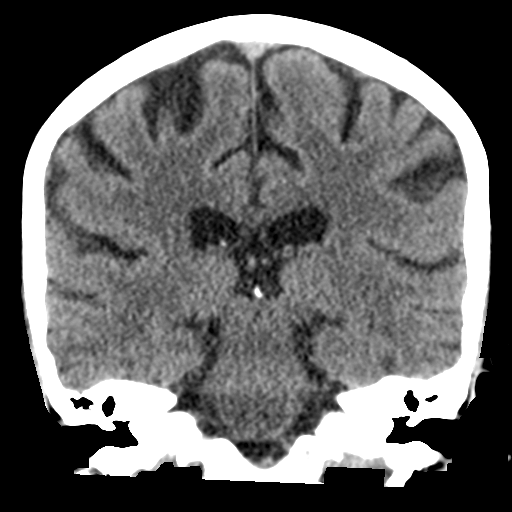

[Series 5: sagittal soft tissue · sagittal · 0.29mm/px · 3 of 49 slices shown]
[im 17/49  brain]
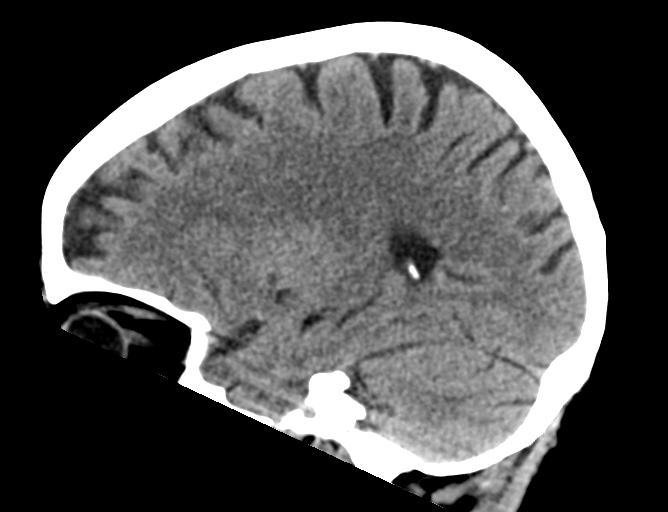
[im 25/49  brain]
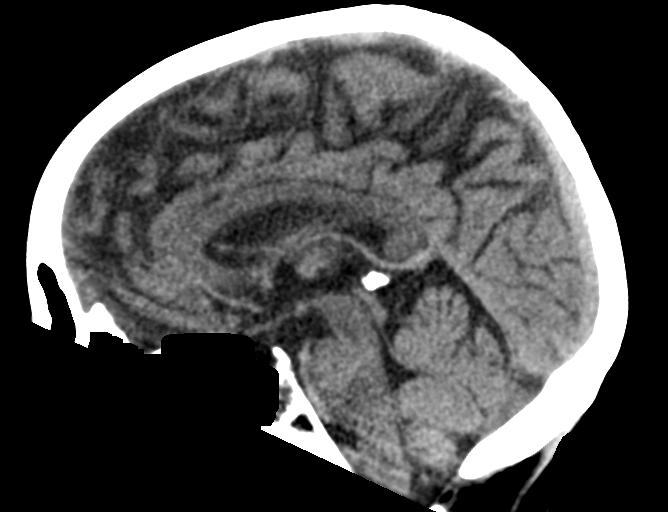
[im 33/49  brain]
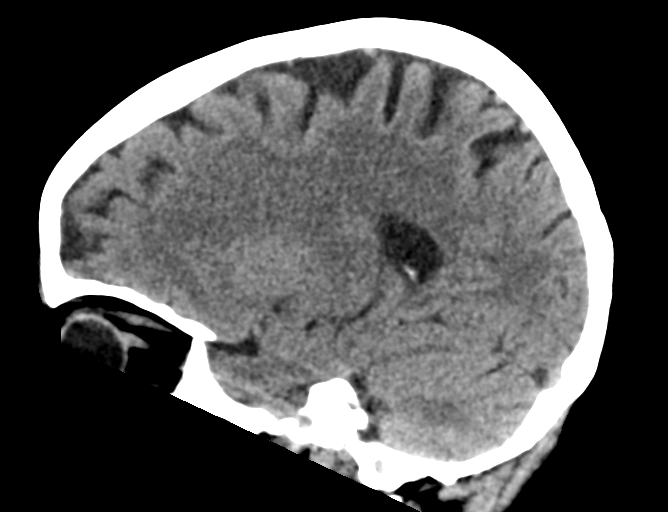

[16 of 45 positions shown; findings below may reference images not displayed]

FINDINGS: Brain: No mass lesion, intraparenchymal hemorrhage or extra-axial
collection. No evidence of acute cortical infarct. Brain parenchyma
and CSF-containing spaces are normal for age.

Vascular: No hyperdense vessel or unexpected calcification.

Skull: Normal visualized skull base, calvarium and extracranial soft
tissues.

Sinuses/Orbits: No sinus fluid levels or advanced mucosal
thickening. No mastoid effusion. Normal orbits.
IMPRESSION: Normal head CT.

## 2018-02-06 MED ORDER — POTASSIUM CHLORIDE ER 10 MEQ TABLET,EXTENDED RELEASE
ORAL_TABLET | 0 refills | 0 days | Status: CP
Start: 2018-02-06 — End: 2018-02-19

## 2018-02-07 MED ORDER — GABAPENTIN 300 MG CAPSULE
ORAL_CAPSULE | 3 refills | 0 days | Status: CP
Start: 2018-02-07 — End: 2018-04-24

## 2018-02-15 ENCOUNTER — Other Ambulatory Visit: Admit: 2018-02-15 | Discharge: 2018-02-16 | Payer: MEDICARE

## 2018-02-15 ENCOUNTER — Ambulatory Visit: Admit: 2018-02-15 | Discharge: 2018-02-16 | Payer: MEDICARE

## 2018-02-15 ENCOUNTER — Ambulatory Visit: Admit: 2018-02-15 | Discharge: 2018-02-16 | Payer: MEDICARE | Attending: Registered" | Primary: Registered"

## 2018-02-15 ENCOUNTER — Ambulatory Visit
Admit: 2018-02-15 | Discharge: 2018-02-16 | Payer: MEDICARE | Attending: Geriatric Medicine | Primary: Geriatric Medicine

## 2018-02-15 DIAGNOSIS — R630 Anorexia: Secondary | ICD-10-CM

## 2018-02-15 DIAGNOSIS — Z5112 Encounter for antineoplastic immunotherapy: Principal | ICD-10-CM

## 2018-02-15 DIAGNOSIS — C50811 Malignant neoplasm of overlapping sites of right female breast: Principal | ICD-10-CM

## 2018-02-15 DIAGNOSIS — R110 Nausea: Secondary | ICD-10-CM

## 2018-02-15 MED ORDER — DRONABINOL 2.5 MG CAPSULE
ORAL_CAPSULE | Freq: Every evening | ORAL | 0 refills | 0 days | Status: CP
Start: 2018-02-15 — End: 2018-04-19

## 2018-02-19 MED ORDER — POTASSIUM CHLORIDE ER 10 MEQ TABLET,EXTENDED RELEASE
ORAL_TABLET | 0 refills | 0 days | Status: CP
Start: 2018-02-19 — End: 2018-03-05

## 2018-02-20 ENCOUNTER — Ambulatory Visit: Admit: 2018-02-20 | Discharge: 2018-02-20 | Payer: MEDICARE

## 2018-02-20 ENCOUNTER — Ambulatory Visit
Admit: 2018-02-20 | Discharge: 2018-02-22 | Payer: MEDICARE | Attending: Radiation Oncology | Primary: Radiation Oncology

## 2018-02-20 DIAGNOSIS — F329 Major depressive disorder, single episode, unspecified: Secondary | ICD-10-CM

## 2018-02-20 DIAGNOSIS — F1990 Other psychoactive substance use, unspecified, uncomplicated: Principal | ICD-10-CM

## 2018-02-20 DIAGNOSIS — F418 Other specified anxiety disorders: Secondary | ICD-10-CM

## 2018-02-20 DIAGNOSIS — Z170 Estrogen receptor positive status [ER+]: Secondary | ICD-10-CM

## 2018-02-20 DIAGNOSIS — C7931 Secondary malignant neoplasm of brain: Secondary | ICD-10-CM

## 2018-02-20 DIAGNOSIS — C50811 Malignant neoplasm of overlapping sites of right female breast: Principal | ICD-10-CM

## 2018-03-01 ENCOUNTER — Ambulatory Visit: Admit: 2018-03-01 | Discharge: 2018-03-14 | Payer: MEDICARE

## 2018-03-01 ENCOUNTER — Ambulatory Visit: Admit: 2018-03-01 | Discharge: 2018-03-01 | Payer: MEDICARE

## 2018-03-01 DIAGNOSIS — C50811 Malignant neoplasm of overlapping sites of right female breast: Principal | ICD-10-CM

## 2018-03-07 MED ORDER — POTASSIUM CHLORIDE ER 10 MEQ TABLET,EXTENDED RELEASE
ORAL_TABLET | 0 refills | 0 days | Status: CP
Start: 2018-03-07 — End: 2018-03-26

## 2018-03-08 ENCOUNTER — Ambulatory Visit: Admit: 2018-03-08 | Discharge: 2018-03-09 | Payer: MEDICARE

## 2018-03-08 DIAGNOSIS — R110 Nausea: Principal | ICD-10-CM

## 2018-03-08 DIAGNOSIS — C50811 Malignant neoplasm of overlapping sites of right female breast: Secondary | ICD-10-CM

## 2018-03-10 ENCOUNTER — Ambulatory Visit
Admit: 2018-03-10 | Discharge: 2018-03-10 | Disposition: A | Discharge status: Home / Self Care | Payer: MEDICARE | Attending: Emergency Medicine

## 2018-03-10 MED ORDER — DICYCLOMINE 20 MG TABLET
ORAL_TABLET | Freq: Two times a day (BID) | ORAL | 0 refills | 0.00000 days | Status: CP
Start: 2018-03-10 — End: 2018-03-20

## 2018-03-12 MED ORDER — CLONAZEPAM 1 MG TABLET
ORAL_TABLET | Freq: Two times a day (BID) | ORAL | 0 refills | 0 days | Status: SS | PRN
Start: 2018-03-12 — End: 2018-03-24

## 2018-03-22 ENCOUNTER — Ambulatory Visit: Admit: 2018-03-22 | Discharge: 2018-03-24 | Payer: MEDICARE

## 2018-03-22 ENCOUNTER — Ambulatory Visit: Admit: 2018-03-22 | Discharge: 2018-03-23 | Payer: MEDICARE

## 2018-03-22 DIAGNOSIS — C50811 Malignant neoplasm of overlapping sites of right female breast: Secondary | ICD-10-CM

## 2018-03-22 DIAGNOSIS — Z170 Estrogen receptor positive status [ER+]: Secondary | ICD-10-CM

## 2018-03-23 DIAGNOSIS — R627 Adult failure to thrive: Principal | ICD-10-CM

## 2018-03-24 MED ORDER — FOLIC ACID 1 MG TABLET
ORAL_TABLET | Freq: Every day | ORAL | 11 refills | 0 days | Status: CP
Start: 2018-03-24 — End: 2018-11-26

## 2018-03-24 MED ORDER — CLONAZEPAM 1 MG TABLET
ORAL_TABLET | Freq: Two times a day (BID) | ORAL | 0 refills | 0.00000 days | Status: CP | PRN
Start: 2018-03-24 — End: 2018-04-12

## 2018-03-24 MED ORDER — ZINC SULFATE 220 MG (50 MG) CAPSULE
ORAL_CAPSULE | Freq: Every day | ORAL | 11 refills | 0 days | Status: CP
Start: 2018-03-24 — End: 2019-04-23

## 2018-03-25 MED ORDER — OLANZAPINE 2.5 MG TABLET
ORAL_TABLET | Freq: Every evening | ORAL | 0 refills | 0.00000 days | Status: SS
Start: 2018-03-25 — End: 2018-09-23

## 2018-03-25 MED ORDER — THIAMINE HCL (VITAMIN B1) 100 MG TABLET
ORAL_TABLET | Freq: Every day | ORAL | 0 refills | 0.00000 days | Status: CP
Start: 2018-03-25 — End: 2019-04-23

## 2018-03-26 MED ORDER — POTASSIUM CHLORIDE ER 10 MEQ TABLET,EXTENDED RELEASE
ORAL_TABLET | Freq: Every day | ORAL | 0 refills | 0 days | Status: CP
Start: 2018-03-26 — End: 2018-04-19

## 2018-03-26 MED ORDER — ESOMEPRAZOLE MAGNESIUM 40 MG CAPSULE,DELAYED RELEASE
ORAL_CAPSULE | Freq: Every day | ORAL | 2 refills | 0.00000 days | Status: CP
Start: 2018-03-26 — End: 2018-08-14

## 2018-03-29 ENCOUNTER — Ambulatory Visit: Admit: 2018-03-29 | Discharge: 2018-03-29 | Payer: MEDICARE | Attending: Adult Health | Primary: Adult Health

## 2018-03-29 ENCOUNTER — Ambulatory Visit: Admit: 2018-03-29 | Discharge: 2018-03-29 | Payer: MEDICARE

## 2018-03-29 ENCOUNTER — Other Ambulatory Visit: Admit: 2018-03-29 | Discharge: 2018-03-29 | Payer: MEDICARE

## 2018-03-29 DIAGNOSIS — Z170 Estrogen receptor positive status [ER+]: Secondary | ICD-10-CM

## 2018-03-29 DIAGNOSIS — C7931 Secondary malignant neoplasm of brain: Secondary | ICD-10-CM

## 2018-03-29 DIAGNOSIS — C50811 Malignant neoplasm of overlapping sites of right female breast: Principal | ICD-10-CM

## 2018-03-29 DIAGNOSIS — F418 Other specified anxiety disorders: Secondary | ICD-10-CM

## 2018-03-29 DIAGNOSIS — R110 Nausea: Principal | ICD-10-CM

## 2018-03-29 DIAGNOSIS — R682 Dry mouth, unspecified: Secondary | ICD-10-CM

## 2018-03-29 DIAGNOSIS — F329 Major depressive disorder, single episode, unspecified: Secondary | ICD-10-CM

## 2018-03-29 DIAGNOSIS — F1990 Other psychoactive substance use, unspecified, uncomplicated: Principal | ICD-10-CM

## 2018-03-29 MED ORDER — ACAMPROSATE 333 MG TABLET,DELAYED RELEASE
ORAL_TABLET | Freq: Three times a day (TID) | ORAL | 1 refills | 0 days | Status: CP
Start: 2018-03-29 — End: 2018-08-06

## 2018-03-29 MED ORDER — SALIVA STIMULANT COMBINATION NO.3 ORAL MUCOSAL SPRAY
ORAL | 0 refills | 0.00000 days | Status: CP | PRN
Start: 2018-03-29 — End: ?

## 2018-04-02 ENCOUNTER — Encounter: Admit: 2018-04-02 | Discharge: 2018-04-02 | Payer: MEDICARE | Attending: Anesthesiology | Primary: Anesthesiology

## 2018-04-02 ENCOUNTER — Ambulatory Visit: Admit: 2018-04-02 | Discharge: 2018-04-02 | Payer: MEDICARE

## 2018-04-02 DIAGNOSIS — R1310 Dysphagia, unspecified: Principal | ICD-10-CM

## 2018-04-11 MED ORDER — DIPHENOXYLATE-ATROPINE 2.5 MG-0.025 MG TABLET
ORAL_TABLET | Freq: Four times a day (QID) | ORAL | 2 refills | 0 days | Status: CP | PRN
Start: 2018-04-11 — End: 2018-08-06

## 2018-04-12 MED ORDER — CLONAZEPAM 1 MG TABLET
ORAL_TABLET | Freq: Two times a day (BID) | ORAL | 0 refills | 0 days | Status: CP | PRN
Start: 2018-04-12 — End: 2018-08-06

## 2018-04-13 ENCOUNTER — Ambulatory Visit: Admit: 2018-04-13 | Discharge: 2018-04-14 | Payer: MEDICARE

## 2018-04-13 DIAGNOSIS — R609 Edema, unspecified: Principal | ICD-10-CM

## 2018-04-13 DIAGNOSIS — C50919 Malignant neoplasm of unspecified site of unspecified female breast: Secondary | ICD-10-CM

## 2018-04-13 DIAGNOSIS — E46 Unspecified protein-calorie malnutrition: Secondary | ICD-10-CM

## 2018-04-19 ENCOUNTER — Ambulatory Visit: Admit: 2018-04-19 | Discharge: 2018-04-19 | Payer: MEDICARE

## 2018-04-19 DIAGNOSIS — R110 Nausea: Principal | ICD-10-CM

## 2018-04-19 DIAGNOSIS — C50811 Malignant neoplasm of overlapping sites of right female breast: Secondary | ICD-10-CM

## 2018-04-19 MED ORDER — POTASSIUM CHLORIDE ER 10 MEQ TABLET,EXTENDED RELEASE
ORAL_TABLET | Freq: Every day | ORAL | 2 refills | 0 days | Status: CP
Start: 2018-04-19 — End: 2018-07-09

## 2018-04-19 MED ORDER — DRONABINOL 2.5 MG CAPSULE
ORAL_CAPSULE | Freq: Every day | ORAL | 0 refills | 0 days | Status: CP
Start: 2018-04-19 — End: 2018-05-25

## 2018-04-24 MED ORDER — GABAPENTIN 300 MG CAPSULE
ORAL_CAPSULE | 3 refills | 0 days | Status: CP
Start: 2018-04-24 — End: 2018-07-18

## 2018-05-10 ENCOUNTER — Ambulatory Visit: Admit: 2018-05-10 | Discharge: 2018-05-10 | Payer: MEDICARE

## 2018-05-10 DIAGNOSIS — R110 Nausea: Principal | ICD-10-CM

## 2018-05-10 DIAGNOSIS — N3942 Incontinence without sensory awareness: Secondary | ICD-10-CM

## 2018-05-10 DIAGNOSIS — C50811 Malignant neoplasm of overlapping sites of right female breast: Secondary | ICD-10-CM

## 2018-05-23 ENCOUNTER — Ambulatory Visit: Admit: 2018-05-23 | Discharge: 2018-05-23 | Payer: MEDICARE

## 2018-05-23 DIAGNOSIS — S42201D Unspecified fracture of upper end of right humerus, subsequent encounter for fracture with routine healing: Principal | ICD-10-CM

## 2018-05-25 ENCOUNTER — Ambulatory Visit: Admit: 2018-05-25 | Discharge: 2018-05-25 | Payer: MEDICARE

## 2018-05-25 ENCOUNTER — Ambulatory Visit
Admit: 2018-05-25 | Discharge: 2018-05-25 | Payer: MEDICARE | Attending: Radiation Oncology | Primary: Radiation Oncology

## 2018-05-25 DIAGNOSIS — C50811 Malignant neoplasm of overlapping sites of right female breast: Principal | ICD-10-CM

## 2018-05-25 DIAGNOSIS — C7931 Secondary malignant neoplasm of brain: Secondary | ICD-10-CM

## 2018-05-25 DIAGNOSIS — Z170 Estrogen receptor positive status [ER+]: Secondary | ICD-10-CM

## 2018-05-28 ENCOUNTER — Ambulatory Visit: Admit: 2018-05-28 | Discharge: 2018-06-06 | Payer: MEDICARE

## 2018-05-28 ENCOUNTER — Ambulatory Visit: Admit: 2018-05-28 | Discharge: 2018-06-26 | Payer: MEDICARE

## 2018-05-28 ENCOUNTER — Ambulatory Visit: Admit: 2018-05-28 | Discharge: 2018-05-29 | Payer: MEDICARE

## 2018-05-28 DIAGNOSIS — C50811 Malignant neoplasm of overlapping sites of right female breast: Principal | ICD-10-CM

## 2018-05-28 DIAGNOSIS — Z170 Estrogen receptor positive status [ER+]: Secondary | ICD-10-CM

## 2018-05-31 ENCOUNTER — Ambulatory Visit: Admit: 2018-05-31 | Discharge: 2018-05-31 | Payer: MEDICARE

## 2018-05-31 ENCOUNTER — Other Ambulatory Visit: Admit: 2018-05-31 | Discharge: 2018-05-31 | Payer: MEDICARE

## 2018-05-31 ENCOUNTER — Ambulatory Visit: Admit: 2018-05-31 | Discharge: 2018-05-31 | Payer: MEDICARE | Attending: Adult Health | Primary: Adult Health

## 2018-05-31 DIAGNOSIS — Z170 Estrogen receptor positive status [ER+]: Secondary | ICD-10-CM

## 2018-05-31 DIAGNOSIS — C50811 Malignant neoplasm of overlapping sites of right female breast: Secondary | ICD-10-CM

## 2018-05-31 DIAGNOSIS — R110 Nausea: Principal | ICD-10-CM

## 2018-05-31 DIAGNOSIS — Z171 Estrogen receptor negative status [ER-]: Secondary | ICD-10-CM

## 2018-05-31 DIAGNOSIS — N3942 Incontinence without sensory awareness: Secondary | ICD-10-CM

## 2018-05-31 DIAGNOSIS — F418 Other specified anxiety disorders: Secondary | ICD-10-CM

## 2018-05-31 DIAGNOSIS — Z5181 Encounter for therapeutic drug level monitoring: Secondary | ICD-10-CM

## 2018-05-31 DIAGNOSIS — F329 Major depressive disorder, single episode, unspecified: Principal | ICD-10-CM

## 2018-05-31 DIAGNOSIS — Z79899 Other long term (current) drug therapy: Secondary | ICD-10-CM

## 2018-05-31 DIAGNOSIS — F1990 Other psychoactive substance use, unspecified, uncomplicated: Secondary | ICD-10-CM

## 2018-05-31 DIAGNOSIS — C7931 Secondary malignant neoplasm of brain: Principal | ICD-10-CM

## 2018-05-31 MED ORDER — DRONABINOL 2.5 MG CAPSULE
ORAL_CAPSULE | Freq: Every day | ORAL | 2 refills | 0.00000 days | Status: CP
Start: 2018-05-31 — End: ?

## 2018-05-31 MED ORDER — HYDROCODONE 5 MG-ACETAMINOPHEN 325 MG TABLET
ORAL_TABLET | Freq: Three times a day (TID) | ORAL | 0 refills | 0.00000 days | Status: CP | PRN
Start: 2018-05-31 — End: 2018-09-26

## 2018-06-04 MED ORDER — DULOXETINE 20 MG CAPSULE,DELAYED RELEASE
ORAL_CAPSULE | 0 refills | 0 days | Status: CP
Start: 2018-06-04 — End: 2018-07-02

## 2018-06-14 IMAGING — CR DG SHOULDER 2+V*R*
3 series · 3 of 3 positions shown · non-contrast
Comparison: None.

CLINICAL DATA: Right shoulder pain post fall today.

EXAM:
RIGHT SHOULDER - 2+ VIEW

[shoulder grashey]
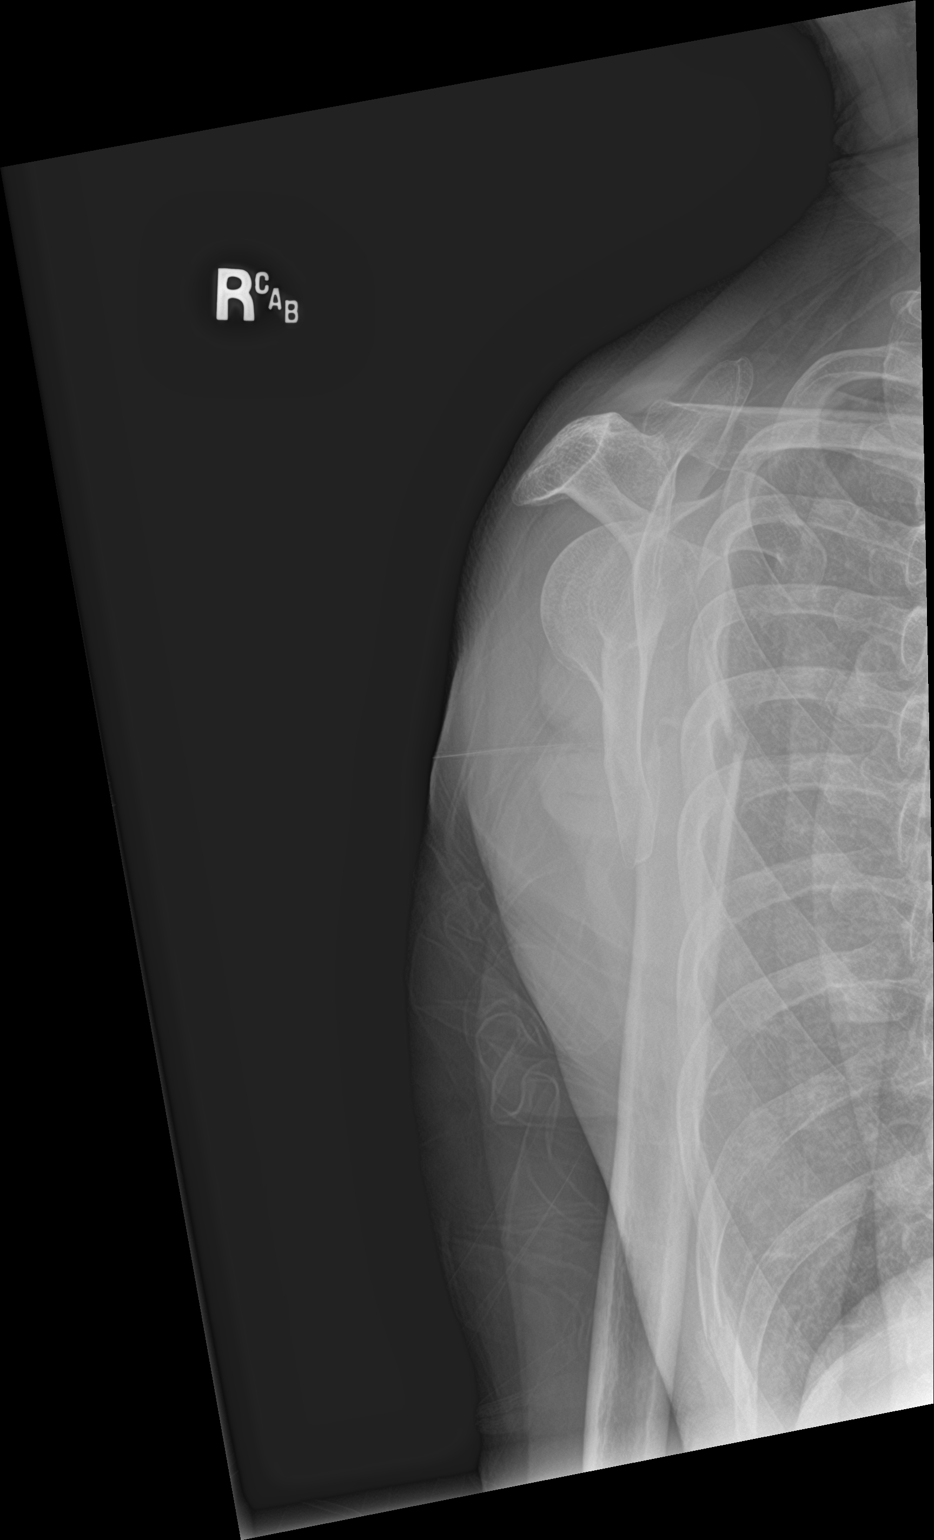

[shoulder y view]
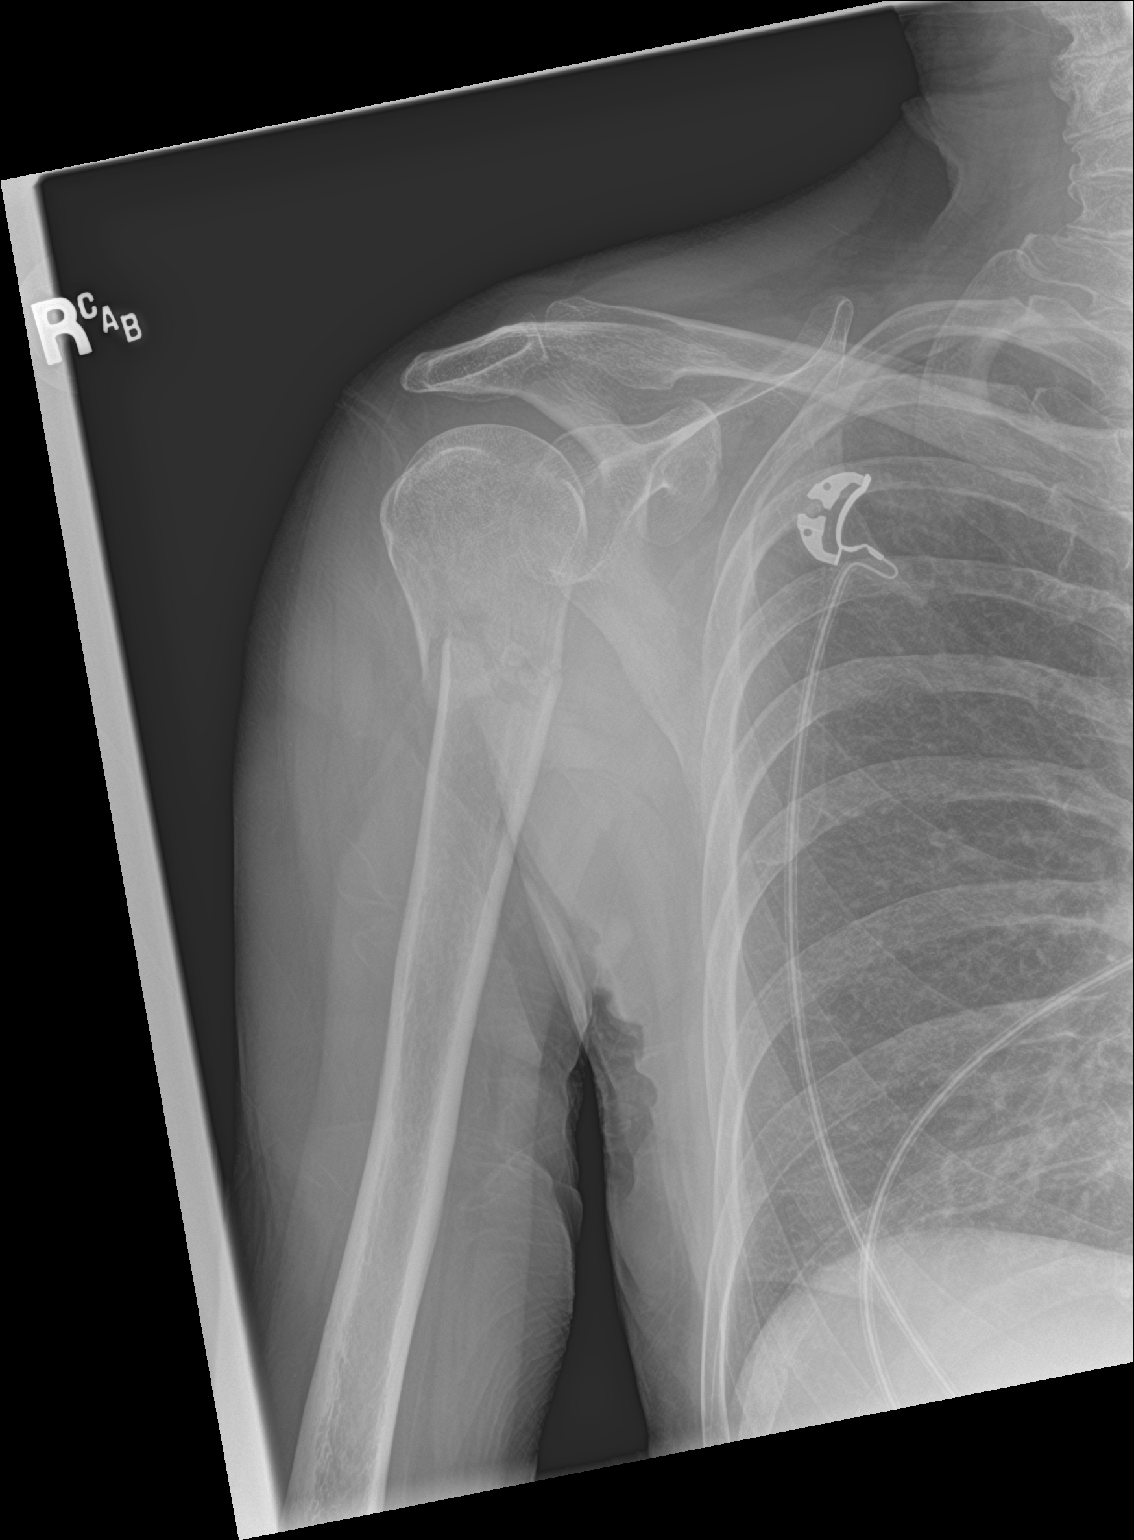

[shoulder axillary]
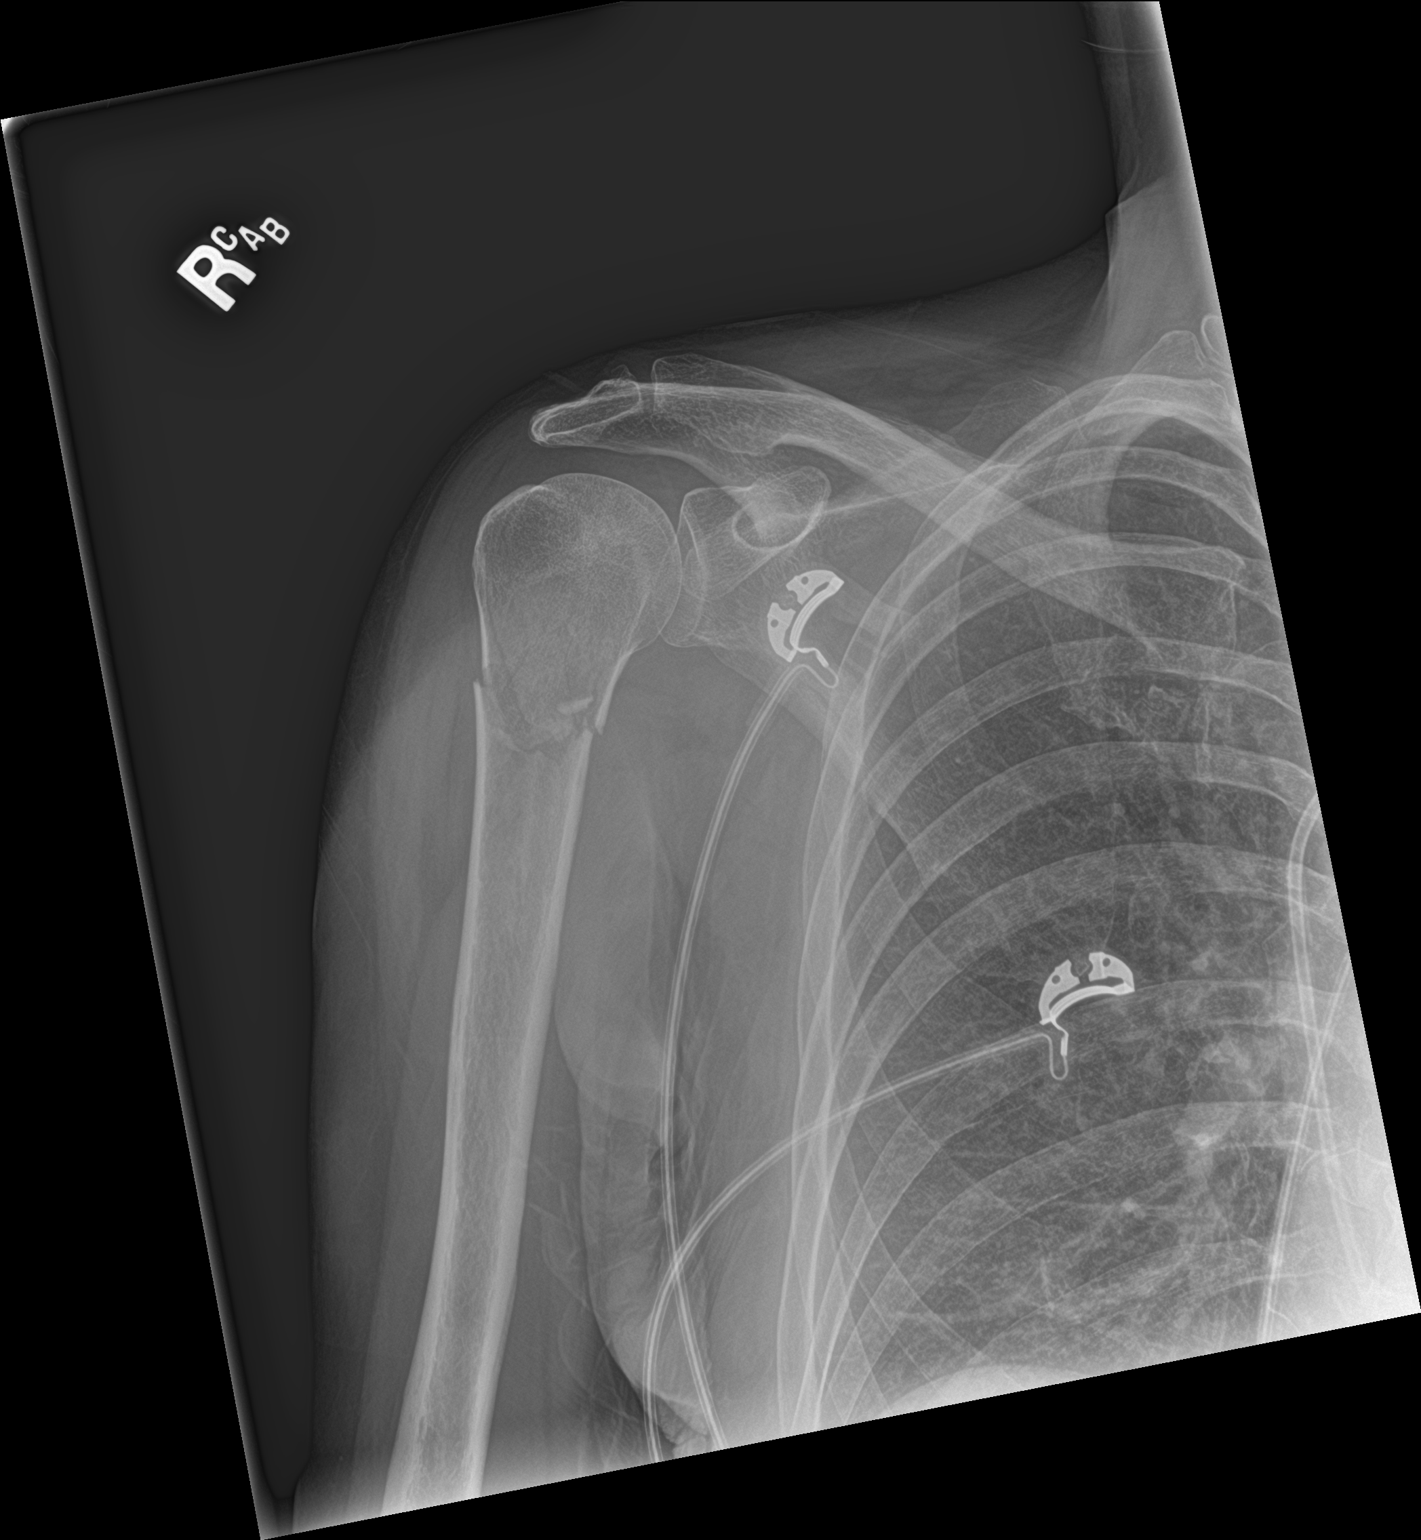

[3 of 3 positions shown; findings below may reference images not displayed]

FINDINGS: Examination demonstrates a displaced transverse fracture of the
humeral neck with mild anterior displacement of the distal fragment.
No evidence of shoulder dislocation. AC joint is normal.
IMPRESSION: Mildly displaced transverse fracture of the right humeral neck.

## 2018-06-21 ENCOUNTER — Ambulatory Visit: Admit: 2018-06-21 | Discharge: 2018-06-22 | Payer: MEDICARE

## 2018-06-21 DIAGNOSIS — C50811 Malignant neoplasm of overlapping sites of right female breast: Secondary | ICD-10-CM

## 2018-06-21 DIAGNOSIS — R110 Nausea: Principal | ICD-10-CM

## 2018-07-02 MED ORDER — DULOXETINE 20 MG CAPSULE,DELAYED RELEASE
ORAL_CAPSULE | Freq: Every day | ORAL | 2 refills | 0 days | Status: CP
Start: 2018-07-02 — End: 2018-10-11

## 2018-07-11 MED ORDER — POTASSIUM CHLORIDE ER 10 MEQ TABLET,EXTENDED RELEASE(PART/CRYST)
ORAL_TABLET | 2 refills | 0 days | Status: CP
Start: 2018-07-11 — End: 2018-10-11

## 2018-07-12 ENCOUNTER — Ambulatory Visit: Admit: 2018-07-12 | Discharge: 2018-07-13 | Payer: MEDICARE

## 2018-07-12 DIAGNOSIS — C50811 Malignant neoplasm of overlapping sites of right female breast: Principal | ICD-10-CM

## 2018-07-12 DIAGNOSIS — R110 Nausea: Secondary | ICD-10-CM

## 2018-07-18 MED ORDER — GABAPENTIN 300 MG CAPSULE
ORAL_CAPSULE | 3 refills | 0 days | Status: CP
Start: 2018-07-18 — End: 2018-08-06

## 2018-08-02 ENCOUNTER — Ambulatory Visit: Admit: 2018-08-02 | Discharge: 2018-08-31 | Payer: MEDICARE

## 2018-08-02 DIAGNOSIS — C7931 Secondary malignant neoplasm of brain: Principal | ICD-10-CM

## 2018-08-02 DIAGNOSIS — C50811 Malignant neoplasm of overlapping sites of right female breast: Secondary | ICD-10-CM

## 2018-08-02 DIAGNOSIS — Z170 Estrogen receptor positive status [ER+]: Secondary | ICD-10-CM

## 2018-08-06 ENCOUNTER — Ambulatory Visit: Admit: 2018-08-06 | Discharge: 2018-08-06 | Payer: MEDICARE

## 2018-08-06 ENCOUNTER — Ambulatory Visit
Admit: 2018-08-06 | Discharge: 2018-08-06 | Payer: MEDICARE | Attending: Geriatric Medicine | Primary: Geriatric Medicine

## 2018-08-06 ENCOUNTER — Other Ambulatory Visit: Admit: 2018-08-06 | Discharge: 2018-08-06 | Payer: MEDICARE

## 2018-08-06 DIAGNOSIS — R630 Anorexia: Secondary | ICD-10-CM

## 2018-08-06 DIAGNOSIS — F419 Anxiety disorder, unspecified: Secondary | ICD-10-CM

## 2018-08-06 DIAGNOSIS — Z170 Estrogen receptor positive status [ER+]: Secondary | ICD-10-CM

## 2018-08-06 DIAGNOSIS — C50811 Malignant neoplasm of overlapping sites of right female breast: Secondary | ICD-10-CM

## 2018-08-06 DIAGNOSIS — F418 Other specified anxiety disorders: Secondary | ICD-10-CM

## 2018-08-06 DIAGNOSIS — F329 Major depressive disorder, single episode, unspecified: Secondary | ICD-10-CM

## 2018-08-06 DIAGNOSIS — R110 Nausea: Principal | ICD-10-CM

## 2018-08-06 DIAGNOSIS — I10 Essential (primary) hypertension: Secondary | ICD-10-CM

## 2018-08-06 DIAGNOSIS — F1990 Other psychoactive substance use, unspecified, uncomplicated: Secondary | ICD-10-CM

## 2018-08-06 DIAGNOSIS — T451X5A Adverse effect of antineoplastic and immunosuppressive drugs, initial encounter: Secondary | ICD-10-CM

## 2018-08-06 DIAGNOSIS — G620 Drug-induced polyneuropathy: Secondary | ICD-10-CM

## 2018-08-06 DIAGNOSIS — C7931 Secondary malignant neoplasm of brain: Secondary | ICD-10-CM

## 2018-08-06 DIAGNOSIS — R197 Diarrhea, unspecified: Secondary | ICD-10-CM

## 2018-08-06 MED ORDER — LISINOPRIL 20 MG-HYDROCHLOROTHIAZIDE 12.5 MG TABLET
ORAL_TABLET | Freq: Every day | ORAL | 1 refills | 0 days | Status: CP
Start: 2018-08-06 — End: 2018-09-23

## 2018-08-06 MED ORDER — GABAPENTIN 300 MG CAPSULE
ORAL_CAPSULE | 3 refills | 0 days | Status: CP
Start: 2018-08-06 — End: 2018-11-01

## 2018-08-06 MED ORDER — DIPHENOXYLATE-ATROPINE 2.5 MG-0.025 MG TABLET
ORAL_TABLET | Freq: Four times a day (QID) | ORAL | 2 refills | 0.00000 days | Status: CP | PRN
Start: 2018-08-06 — End: 2019-03-19

## 2018-08-06 MED ORDER — CLONAZEPAM 1 MG TABLET
ORAL_TABLET | Freq: Two times a day (BID) | ORAL | 0 refills | 0.00000 days | Status: CP | PRN
Start: 2018-08-06 — End: 2018-09-23

## 2018-08-14 MED ORDER — ESOMEPRAZOLE MAGNESIUM 40 MG CAPSULE,DELAYED RELEASE
ORAL_CAPSULE | Freq: Every day | ORAL | 0 refills | 0 days | Status: CP
Start: 2018-08-14 — End: 2018-09-12

## 2018-08-28 ENCOUNTER — Ambulatory Visit: Admit: 2018-08-28 | Discharge: 2018-09-24 | Payer: MEDICARE

## 2018-08-28 DIAGNOSIS — Z510 Encounter for antineoplastic radiation therapy: Principal | ICD-10-CM

## 2018-08-28 DIAGNOSIS — C7931 Secondary malignant neoplasm of brain: Secondary | ICD-10-CM

## 2018-08-29 ENCOUNTER — Ambulatory Visit: Admit: 2018-08-29 | Discharge: 2018-09-17 | Payer: MEDICARE

## 2018-08-29 ENCOUNTER — Ambulatory Visit
Admit: 2018-08-29 | Discharge: 2018-09-17 | Payer: MEDICARE | Attending: Radiation Oncology | Primary: Radiation Oncology

## 2018-08-29 ENCOUNTER — Other Ambulatory Visit: Admit: 2018-08-29 | Discharge: 2018-09-17 | Payer: MEDICARE

## 2018-08-29 DIAGNOSIS — C7931 Secondary malignant neoplasm of brain: Principal | ICD-10-CM

## 2018-08-29 DIAGNOSIS — R110 Nausea: Principal | ICD-10-CM

## 2018-08-29 DIAGNOSIS — C50811 Malignant neoplasm of overlapping sites of right female breast: Secondary | ICD-10-CM

## 2018-09-05 DIAGNOSIS — C50811 Malignant neoplasm of overlapping sites of right female breast: Principal | ICD-10-CM

## 2018-09-05 DIAGNOSIS — Z170 Estrogen receptor positive status [ER+]: Secondary | ICD-10-CM

## 2018-09-12 MED ORDER — ESOMEPRAZOLE MAGNESIUM 40 MG CAPSULE,DELAYED RELEASE
ORAL_CAPSULE | Freq: Every day | ORAL | 0 refills | 0.00000 days | Status: CP
Start: 2018-09-12 — End: 2018-10-11

## 2018-09-13 DIAGNOSIS — Z170 Estrogen receptor positive status [ER+]: Secondary | ICD-10-CM

## 2018-09-13 DIAGNOSIS — C7931 Secondary malignant neoplasm of brain: Principal | ICD-10-CM

## 2018-09-13 DIAGNOSIS — C50811 Malignant neoplasm of overlapping sites of right female breast: Principal | ICD-10-CM

## 2018-09-13 MED ORDER — DEXAMETHASONE 4 MG TABLET
ORAL_TABLET | Freq: Every day | ORAL | 0 refills | 0 days | Status: CP
Start: 2018-09-13 — End: 2018-09-23

## 2018-09-20 DIAGNOSIS — M549 Dorsalgia, unspecified: Secondary | ICD-10-CM

## 2018-09-20 DIAGNOSIS — R110 Nausea: Principal | ICD-10-CM

## 2018-09-20 DIAGNOSIS — R252 Cramp and spasm: Secondary | ICD-10-CM

## 2018-09-20 DIAGNOSIS — C50811 Malignant neoplasm of overlapping sites of right female breast: Secondary | ICD-10-CM

## 2018-09-20 MED ORDER — CYCLOBENZAPRINE 5 MG TABLET
ORAL_TABLET | Freq: Three times a day (TID) | ORAL | 0 refills | 0 days | Status: CP | PRN
Start: 2018-09-20 — End: 2018-09-27

## 2018-09-21 ENCOUNTER — Ambulatory Visit: Admit: 2018-09-21 | Discharge: 2018-09-23 | Disposition: A | Discharge status: Home / Self Care | Payer: MEDICARE

## 2018-09-21 DIAGNOSIS — E871 Hypo-osmolality and hyponatremia: Principal | ICD-10-CM

## 2018-09-23 MED ORDER — OLANZAPINE 2.5 MG TABLET
ORAL_TABLET | Freq: Every evening | ORAL | 0 refills | 0.00000 days | Status: SS
Start: 2018-09-23 — End: 2019-01-17

## 2018-09-23 MED ORDER — OXYCODONE 5 MG TABLET
ORAL_TABLET | ORAL | 0 refills | 0.00000 days | Status: SS | PRN
Start: 2018-09-23 — End: 2018-09-26

## 2018-09-24 MED ORDER — LISINOPRIL 20 MG TABLET
ORAL_TABLET | Freq: Every day | ORAL | 0 refills | 0 days | Status: CP
Start: 2018-09-24 — End: 2018-10-15

## 2018-09-25 ENCOUNTER — Ambulatory Visit: Admit: 2018-09-25 | Discharge: 2018-09-26 | Payer: MEDICARE

## 2018-09-25 DIAGNOSIS — S32511A Fracture of superior rim of right pubis, initial encounter for closed fracture: Principal | ICD-10-CM

## 2018-09-26 DIAGNOSIS — S32511A Fracture of superior rim of right pubis, initial encounter for closed fracture: Principal | ICD-10-CM

## 2018-09-26 MED ORDER — CALCIUM CARBONATE 200 MG CALCIUM (500 MG) CHEWABLE TABLET
ORAL_TABLET | Freq: Every day | ORAL | 0 refills | 0.00000 days | Status: CP
Start: 2018-09-26 — End: 2018-10-15

## 2018-09-26 MED ORDER — OXYCODONE 5 MG TABLET
ORAL_TABLET | ORAL | 0 refills | 0.00000 days | Status: CP
Start: 2018-09-26 — End: 2018-09-26

## 2018-09-26 MED ORDER — OXYCODONE 5 MG TABLET: tablet | 0 refills | 0 days | Status: AC

## 2018-09-27 DIAGNOSIS — F419 Anxiety disorder, unspecified: Secondary | ICD-10-CM

## 2018-09-27 DIAGNOSIS — F17210 Nicotine dependence, cigarettes, uncomplicated: Secondary | ICD-10-CM

## 2018-09-27 DIAGNOSIS — J309 Allergic rhinitis, unspecified: Secondary | ICD-10-CM

## 2018-09-27 DIAGNOSIS — E785 Hyperlipidemia, unspecified: Secondary | ICD-10-CM

## 2018-09-27 DIAGNOSIS — R1310 Dysphagia, unspecified: Secondary | ICD-10-CM

## 2018-09-27 DIAGNOSIS — I10 Essential (primary) hypertension: Secondary | ICD-10-CM

## 2018-09-27 DIAGNOSIS — Z9181 History of falling: Secondary | ICD-10-CM

## 2018-09-27 DIAGNOSIS — R627 Adult failure to thrive: Secondary | ICD-10-CM

## 2018-09-27 DIAGNOSIS — G893 Neoplasm related pain (acute) (chronic): Secondary | ICD-10-CM

## 2018-09-27 DIAGNOSIS — G629 Polyneuropathy, unspecified: Secondary | ICD-10-CM

## 2018-09-27 DIAGNOSIS — E871 Hypo-osmolality and hyponatremia: Secondary | ICD-10-CM

## 2018-09-27 DIAGNOSIS — C50811 Malignant neoplasm of overlapping sites of right female breast: Secondary | ICD-10-CM

## 2018-09-27 DIAGNOSIS — F1020 Alcohol dependence, uncomplicated: Secondary | ICD-10-CM

## 2018-09-27 DIAGNOSIS — E876 Hypokalemia: Secondary | ICD-10-CM

## 2018-09-27 DIAGNOSIS — C7931 Secondary malignant neoplasm of brain: Secondary | ICD-10-CM

## 2018-09-27 DIAGNOSIS — F329 Major depressive disorder, single episode, unspecified: Secondary | ICD-10-CM

## 2018-09-27 DIAGNOSIS — K219 Gastro-esophageal reflux disease without esophagitis: Secondary | ICD-10-CM

## 2018-09-27 DIAGNOSIS — J449 Chronic obstructive pulmonary disease, unspecified: Secondary | ICD-10-CM

## 2018-09-27 DIAGNOSIS — Z9221 Personal history of antineoplastic chemotherapy: Secondary | ICD-10-CM

## 2018-09-27 DIAGNOSIS — G4700 Insomnia, unspecified: Secondary | ICD-10-CM

## 2018-09-27 DIAGNOSIS — S32501A Unspecified fracture of right pubis, initial encounter for closed fracture: Principal | ICD-10-CM

## 2018-09-27 MED ORDER — CYCLOBENZAPRINE 5 MG TABLET
ORAL_TABLET | Freq: Three times a day (TID) | ORAL | 0 refills | 0 days | Status: CP | PRN
Start: 2018-09-27 — End: 2018-10-15

## 2018-10-04 ENCOUNTER — Encounter: Admit: 2018-10-04 | Discharge: 2018-11-02 | Payer: MEDICARE

## 2018-10-04 ENCOUNTER — Inpatient Hospital Stay: Admit: 2018-10-04 | Discharge: 2018-11-02 | Payer: MEDICARE

## 2018-10-04 DIAGNOSIS — I10 Essential (primary) hypertension: Secondary | ICD-10-CM

## 2018-10-04 DIAGNOSIS — F419 Anxiety disorder, unspecified: Secondary | ICD-10-CM

## 2018-10-04 DIAGNOSIS — E871 Hypo-osmolality and hyponatremia: Secondary | ICD-10-CM

## 2018-10-04 DIAGNOSIS — F1020 Alcohol dependence, uncomplicated: Secondary | ICD-10-CM

## 2018-10-04 DIAGNOSIS — E876 Hypokalemia: Secondary | ICD-10-CM

## 2018-10-04 DIAGNOSIS — Z9221 Personal history of antineoplastic chemotherapy: Secondary | ICD-10-CM

## 2018-10-04 DIAGNOSIS — G629 Polyneuropathy, unspecified: Secondary | ICD-10-CM

## 2018-10-04 DIAGNOSIS — C50811 Malignant neoplasm of overlapping sites of right female breast: Secondary | ICD-10-CM

## 2018-10-04 DIAGNOSIS — J449 Chronic obstructive pulmonary disease, unspecified: Secondary | ICD-10-CM

## 2018-10-04 DIAGNOSIS — R627 Adult failure to thrive: Secondary | ICD-10-CM

## 2018-10-04 DIAGNOSIS — G893 Neoplasm related pain (acute) (chronic): Secondary | ICD-10-CM

## 2018-10-04 DIAGNOSIS — E785 Hyperlipidemia, unspecified: Secondary | ICD-10-CM

## 2018-10-04 DIAGNOSIS — R1310 Dysphagia, unspecified: Secondary | ICD-10-CM

## 2018-10-04 DIAGNOSIS — F17210 Nicotine dependence, cigarettes, uncomplicated: Secondary | ICD-10-CM

## 2018-10-04 DIAGNOSIS — C7931 Secondary malignant neoplasm of brain: Secondary | ICD-10-CM

## 2018-10-04 DIAGNOSIS — Z9181 History of falling: Secondary | ICD-10-CM

## 2018-10-04 DIAGNOSIS — G4700 Insomnia, unspecified: Secondary | ICD-10-CM

## 2018-10-04 DIAGNOSIS — J309 Allergic rhinitis, unspecified: Secondary | ICD-10-CM

## 2018-10-04 DIAGNOSIS — S32501A Unspecified fracture of right pubis, initial encounter for closed fracture: Principal | ICD-10-CM

## 2018-10-04 DIAGNOSIS — F329 Major depressive disorder, single episode, unspecified: Secondary | ICD-10-CM

## 2018-10-04 DIAGNOSIS — K219 Gastro-esophageal reflux disease without esophagitis: Secondary | ICD-10-CM

## 2018-10-09 DIAGNOSIS — E785 Hyperlipidemia, unspecified: Secondary | ICD-10-CM

## 2018-10-09 DIAGNOSIS — G4700 Insomnia, unspecified: Secondary | ICD-10-CM

## 2018-10-09 DIAGNOSIS — S32501A Unspecified fracture of right pubis, initial encounter for closed fracture: Principal | ICD-10-CM

## 2018-10-09 DIAGNOSIS — F1020 Alcohol dependence, uncomplicated: Secondary | ICD-10-CM

## 2018-10-09 DIAGNOSIS — G629 Polyneuropathy, unspecified: Secondary | ICD-10-CM

## 2018-10-09 DIAGNOSIS — C7931 Secondary malignant neoplasm of brain: Secondary | ICD-10-CM

## 2018-10-09 DIAGNOSIS — E871 Hypo-osmolality and hyponatremia: Secondary | ICD-10-CM

## 2018-10-09 DIAGNOSIS — Z9221 Personal history of antineoplastic chemotherapy: Secondary | ICD-10-CM

## 2018-10-09 DIAGNOSIS — G893 Neoplasm related pain (acute) (chronic): Secondary | ICD-10-CM

## 2018-10-09 DIAGNOSIS — J309 Allergic rhinitis, unspecified: Secondary | ICD-10-CM

## 2018-10-09 DIAGNOSIS — K219 Gastro-esophageal reflux disease without esophagitis: Secondary | ICD-10-CM

## 2018-10-09 DIAGNOSIS — E876 Hypokalemia: Secondary | ICD-10-CM

## 2018-10-09 DIAGNOSIS — I10 Essential (primary) hypertension: Secondary | ICD-10-CM

## 2018-10-09 DIAGNOSIS — J449 Chronic obstructive pulmonary disease, unspecified: Secondary | ICD-10-CM

## 2018-10-09 DIAGNOSIS — R1310 Dysphagia, unspecified: Secondary | ICD-10-CM

## 2018-10-09 DIAGNOSIS — Z9181 History of falling: Secondary | ICD-10-CM

## 2018-10-09 DIAGNOSIS — F419 Anxiety disorder, unspecified: Secondary | ICD-10-CM

## 2018-10-09 DIAGNOSIS — F329 Major depressive disorder, single episode, unspecified: Secondary | ICD-10-CM

## 2018-10-09 DIAGNOSIS — R627 Adult failure to thrive: Secondary | ICD-10-CM

## 2018-10-09 DIAGNOSIS — F17210 Nicotine dependence, cigarettes, uncomplicated: Secondary | ICD-10-CM

## 2018-10-09 DIAGNOSIS — C50811 Malignant neoplasm of overlapping sites of right female breast: Secondary | ICD-10-CM

## 2018-10-11 ENCOUNTER — Other Ambulatory Visit: Admit: 2018-10-11 | Discharge: 2018-10-12 | Payer: MEDICARE

## 2018-10-11 ENCOUNTER — Ambulatory Visit: Admit: 2018-10-11 | Discharge: 2018-10-12 | Payer: MEDICARE | Attending: Psychiatry | Primary: Psychiatry

## 2018-10-11 ENCOUNTER — Ambulatory Visit: Admit: 2018-10-11 | Discharge: 2018-10-12 | Payer: MEDICARE | Attending: Adult Health | Primary: Adult Health

## 2018-10-11 ENCOUNTER — Ambulatory Visit: Admit: 2018-10-11 | Discharge: 2018-10-12 | Payer: MEDICARE

## 2018-10-11 DIAGNOSIS — C50811 Malignant neoplasm of overlapping sites of right female breast: Secondary | ICD-10-CM

## 2018-10-11 DIAGNOSIS — F419 Anxiety disorder, unspecified: Secondary | ICD-10-CM

## 2018-10-11 DIAGNOSIS — Z9181 History of falling: Secondary | ICD-10-CM

## 2018-10-11 DIAGNOSIS — J449 Chronic obstructive pulmonary disease, unspecified: Secondary | ICD-10-CM

## 2018-10-11 DIAGNOSIS — C7931 Secondary malignant neoplasm of brain: Secondary | ICD-10-CM

## 2018-10-11 DIAGNOSIS — R1310 Dysphagia, unspecified: Secondary | ICD-10-CM

## 2018-10-11 DIAGNOSIS — K219 Gastro-esophageal reflux disease without esophagitis: Secondary | ICD-10-CM

## 2018-10-11 DIAGNOSIS — F1020 Alcohol dependence, uncomplicated: Secondary | ICD-10-CM

## 2018-10-11 DIAGNOSIS — F329 Major depressive disorder, single episode, unspecified: Secondary | ICD-10-CM

## 2018-10-11 DIAGNOSIS — G629 Polyneuropathy, unspecified: Secondary | ICD-10-CM

## 2018-10-11 DIAGNOSIS — E871 Hypo-osmolality and hyponatremia: Secondary | ICD-10-CM

## 2018-10-11 DIAGNOSIS — E876 Hypokalemia: Secondary | ICD-10-CM

## 2018-10-11 DIAGNOSIS — F17210 Nicotine dependence, cigarettes, uncomplicated: Secondary | ICD-10-CM

## 2018-10-11 DIAGNOSIS — J309 Allergic rhinitis, unspecified: Secondary | ICD-10-CM

## 2018-10-11 DIAGNOSIS — R627 Adult failure to thrive: Secondary | ICD-10-CM

## 2018-10-11 DIAGNOSIS — I10 Essential (primary) hypertension: Secondary | ICD-10-CM

## 2018-10-11 DIAGNOSIS — R110 Nausea: Principal | ICD-10-CM

## 2018-10-11 DIAGNOSIS — Z170 Estrogen receptor positive status [ER+]: Secondary | ICD-10-CM

## 2018-10-11 DIAGNOSIS — Z9221 Personal history of antineoplastic chemotherapy: Secondary | ICD-10-CM

## 2018-10-11 DIAGNOSIS — G893 Neoplasm related pain (acute) (chronic): Secondary | ICD-10-CM

## 2018-10-11 DIAGNOSIS — S32501A Unspecified fracture of right pubis, initial encounter for closed fracture: Principal | ICD-10-CM

## 2018-10-11 DIAGNOSIS — E785 Hyperlipidemia, unspecified: Secondary | ICD-10-CM

## 2018-10-11 DIAGNOSIS — G4700 Insomnia, unspecified: Secondary | ICD-10-CM

## 2018-10-11 MED ORDER — DULOXETINE 30 MG CAPSULE,DELAYED RELEASE
ORAL_CAPSULE | 0 refills | 0 days
Start: 2018-10-11 — End: 2018-11-13

## 2018-10-11 MED ORDER — POTASSIUM CHLORIDE ER 10 MEQ TABLET,EXTENDED RELEASE(PART/CRYST)
ORAL_TABLET | Freq: Every day | ORAL | 2 refills | 0.00000 days | Status: SS
Start: 2018-10-11 — End: 2019-01-17

## 2018-10-11 MED ORDER — ESOMEPRAZOLE MAGNESIUM 40 MG CAPSULE,DELAYED RELEASE
ORAL_CAPSULE | Freq: Every day | ORAL | 0 refills | 0.00000 days | Status: CP
Start: 2018-10-11 — End: 2018-11-14

## 2018-10-11 MED ORDER — CLONAZEPAM 1 MG DISINTEGRATING TABLET
ORAL_TABLET | Freq: Two times a day (BID) | ORAL | 0 refills | 0.00000 days
Start: 2018-10-11 — End: 2018-11-13

## 2018-10-12 DIAGNOSIS — G4700 Insomnia, unspecified: Secondary | ICD-10-CM

## 2018-10-12 DIAGNOSIS — C50811 Malignant neoplasm of overlapping sites of right female breast: Secondary | ICD-10-CM

## 2018-10-12 DIAGNOSIS — E785 Hyperlipidemia, unspecified: Secondary | ICD-10-CM

## 2018-10-12 DIAGNOSIS — G893 Neoplasm related pain (acute) (chronic): Secondary | ICD-10-CM

## 2018-10-12 DIAGNOSIS — Z9221 Personal history of antineoplastic chemotherapy: Secondary | ICD-10-CM

## 2018-10-12 DIAGNOSIS — J449 Chronic obstructive pulmonary disease, unspecified: Secondary | ICD-10-CM

## 2018-10-12 DIAGNOSIS — E871 Hypo-osmolality and hyponatremia: Secondary | ICD-10-CM

## 2018-10-12 DIAGNOSIS — S32501A Unspecified fracture of right pubis, initial encounter for closed fracture: Principal | ICD-10-CM

## 2018-10-12 DIAGNOSIS — R1310 Dysphagia, unspecified: Secondary | ICD-10-CM

## 2018-10-12 DIAGNOSIS — E876 Hypokalemia: Secondary | ICD-10-CM

## 2018-10-12 DIAGNOSIS — Z9181 History of falling: Secondary | ICD-10-CM

## 2018-10-12 DIAGNOSIS — F1020 Alcohol dependence, uncomplicated: Secondary | ICD-10-CM

## 2018-10-12 DIAGNOSIS — I10 Essential (primary) hypertension: Secondary | ICD-10-CM

## 2018-10-12 DIAGNOSIS — G629 Polyneuropathy, unspecified: Secondary | ICD-10-CM

## 2018-10-12 DIAGNOSIS — C7931 Secondary malignant neoplasm of brain: Secondary | ICD-10-CM

## 2018-10-12 DIAGNOSIS — F419 Anxiety disorder, unspecified: Secondary | ICD-10-CM

## 2018-10-12 DIAGNOSIS — R627 Adult failure to thrive: Secondary | ICD-10-CM

## 2018-10-12 DIAGNOSIS — K219 Gastro-esophageal reflux disease without esophagitis: Secondary | ICD-10-CM

## 2018-10-12 DIAGNOSIS — J309 Allergic rhinitis, unspecified: Secondary | ICD-10-CM

## 2018-10-12 DIAGNOSIS — F17210 Nicotine dependence, cigarettes, uncomplicated: Secondary | ICD-10-CM

## 2018-10-12 DIAGNOSIS — F329 Major depressive disorder, single episode, unspecified: Secondary | ICD-10-CM

## 2018-10-15 ENCOUNTER — Ambulatory Visit: Admit: 2018-10-15 | Discharge: 2018-10-16 | Payer: MEDICARE

## 2018-10-15 DIAGNOSIS — D7589 Other specified diseases of blood and blood-forming organs: Principal | ICD-10-CM

## 2018-10-15 DIAGNOSIS — C50811 Malignant neoplasm of overlapping sites of right female breast: Secondary | ICD-10-CM

## 2018-10-15 DIAGNOSIS — Z170 Estrogen receptor positive status [ER+]: Secondary | ICD-10-CM

## 2018-10-15 DIAGNOSIS — S32591A Other specified fracture of right pubis, initial encounter for closed fracture: Secondary | ICD-10-CM

## 2018-10-15 MED ORDER — LISINOPRIL 20 MG TABLET
ORAL_TABLET | Freq: Every day | ORAL | 3 refills | 0 days | Status: SS
Start: 2018-10-15 — End: 2019-01-17

## 2018-10-16 DIAGNOSIS — F329 Major depressive disorder, single episode, unspecified: Secondary | ICD-10-CM

## 2018-10-16 DIAGNOSIS — F419 Anxiety disorder, unspecified: Secondary | ICD-10-CM

## 2018-10-16 DIAGNOSIS — Z9221 Personal history of antineoplastic chemotherapy: Secondary | ICD-10-CM

## 2018-10-16 DIAGNOSIS — K219 Gastro-esophageal reflux disease without esophagitis: Secondary | ICD-10-CM

## 2018-10-16 DIAGNOSIS — R627 Adult failure to thrive: Secondary | ICD-10-CM

## 2018-10-16 DIAGNOSIS — G893 Neoplasm related pain (acute) (chronic): Secondary | ICD-10-CM

## 2018-10-16 DIAGNOSIS — C7931 Secondary malignant neoplasm of brain: Secondary | ICD-10-CM

## 2018-10-16 DIAGNOSIS — E871 Hypo-osmolality and hyponatremia: Secondary | ICD-10-CM

## 2018-10-16 DIAGNOSIS — F1020 Alcohol dependence, uncomplicated: Secondary | ICD-10-CM

## 2018-10-16 DIAGNOSIS — F17210 Nicotine dependence, cigarettes, uncomplicated: Secondary | ICD-10-CM

## 2018-10-16 DIAGNOSIS — S32501A Unspecified fracture of right pubis, initial encounter for closed fracture: Principal | ICD-10-CM

## 2018-10-16 DIAGNOSIS — C50811 Malignant neoplasm of overlapping sites of right female breast: Secondary | ICD-10-CM

## 2018-10-16 DIAGNOSIS — Z9181 History of falling: Secondary | ICD-10-CM

## 2018-10-16 DIAGNOSIS — I10 Essential (primary) hypertension: Secondary | ICD-10-CM

## 2018-10-16 DIAGNOSIS — J449 Chronic obstructive pulmonary disease, unspecified: Secondary | ICD-10-CM

## 2018-10-16 DIAGNOSIS — E876 Hypokalemia: Secondary | ICD-10-CM

## 2018-10-16 DIAGNOSIS — J309 Allergic rhinitis, unspecified: Secondary | ICD-10-CM

## 2018-10-16 DIAGNOSIS — G629 Polyneuropathy, unspecified: Secondary | ICD-10-CM

## 2018-10-16 DIAGNOSIS — R1310 Dysphagia, unspecified: Secondary | ICD-10-CM

## 2018-10-16 DIAGNOSIS — E785 Hyperlipidemia, unspecified: Secondary | ICD-10-CM

## 2018-10-16 DIAGNOSIS — G4700 Insomnia, unspecified: Secondary | ICD-10-CM

## 2018-10-17 ENCOUNTER — Ambulatory Visit: Admit: 2018-10-17 | Discharge: 2018-10-18 | Payer: MEDICARE

## 2018-10-17 DIAGNOSIS — S32591A Other specified fracture of right pubis, initial encounter for closed fracture: Secondary | ICD-10-CM

## 2018-10-17 DIAGNOSIS — M25559 Pain in unspecified hip: Principal | ICD-10-CM

## 2018-10-18 DIAGNOSIS — F329 Major depressive disorder, single episode, unspecified: Secondary | ICD-10-CM

## 2018-10-18 DIAGNOSIS — G4700 Insomnia, unspecified: Secondary | ICD-10-CM

## 2018-10-18 DIAGNOSIS — G893 Neoplasm related pain (acute) (chronic): Secondary | ICD-10-CM

## 2018-10-18 DIAGNOSIS — F419 Anxiety disorder, unspecified: Secondary | ICD-10-CM

## 2018-10-18 DIAGNOSIS — Z9181 History of falling: Secondary | ICD-10-CM

## 2018-10-18 DIAGNOSIS — Z9221 Personal history of antineoplastic chemotherapy: Secondary | ICD-10-CM

## 2018-10-18 DIAGNOSIS — G629 Polyneuropathy, unspecified: Secondary | ICD-10-CM

## 2018-10-18 DIAGNOSIS — R627 Adult failure to thrive: Secondary | ICD-10-CM

## 2018-10-18 DIAGNOSIS — E876 Hypokalemia: Secondary | ICD-10-CM

## 2018-10-18 DIAGNOSIS — C50811 Malignant neoplasm of overlapping sites of right female breast: Secondary | ICD-10-CM

## 2018-10-18 DIAGNOSIS — J309 Allergic rhinitis, unspecified: Secondary | ICD-10-CM

## 2018-10-18 DIAGNOSIS — C7931 Secondary malignant neoplasm of brain: Secondary | ICD-10-CM

## 2018-10-18 DIAGNOSIS — J449 Chronic obstructive pulmonary disease, unspecified: Secondary | ICD-10-CM

## 2018-10-18 DIAGNOSIS — I10 Essential (primary) hypertension: Secondary | ICD-10-CM

## 2018-10-18 DIAGNOSIS — K219 Gastro-esophageal reflux disease without esophagitis: Secondary | ICD-10-CM

## 2018-10-18 DIAGNOSIS — R1310 Dysphagia, unspecified: Secondary | ICD-10-CM

## 2018-10-18 DIAGNOSIS — S32501A Unspecified fracture of right pubis, initial encounter for closed fracture: Principal | ICD-10-CM

## 2018-10-18 DIAGNOSIS — F17210 Nicotine dependence, cigarettes, uncomplicated: Secondary | ICD-10-CM

## 2018-10-18 DIAGNOSIS — F1020 Alcohol dependence, uncomplicated: Secondary | ICD-10-CM

## 2018-10-18 DIAGNOSIS — E871 Hypo-osmolality and hyponatremia: Secondary | ICD-10-CM

## 2018-10-18 DIAGNOSIS — E785 Hyperlipidemia, unspecified: Secondary | ICD-10-CM

## 2018-10-24 DIAGNOSIS — K219 Gastro-esophageal reflux disease without esophagitis: Secondary | ICD-10-CM

## 2018-10-24 DIAGNOSIS — G893 Neoplasm related pain (acute) (chronic): Secondary | ICD-10-CM

## 2018-10-24 DIAGNOSIS — R627 Adult failure to thrive: Secondary | ICD-10-CM

## 2018-10-24 DIAGNOSIS — J449 Chronic obstructive pulmonary disease, unspecified: Secondary | ICD-10-CM

## 2018-10-24 DIAGNOSIS — F17210 Nicotine dependence, cigarettes, uncomplicated: Secondary | ICD-10-CM

## 2018-10-24 DIAGNOSIS — R1310 Dysphagia, unspecified: Secondary | ICD-10-CM

## 2018-10-24 DIAGNOSIS — C50811 Malignant neoplasm of overlapping sites of right female breast: Secondary | ICD-10-CM

## 2018-10-24 DIAGNOSIS — F329 Major depressive disorder, single episode, unspecified: Secondary | ICD-10-CM

## 2018-10-24 DIAGNOSIS — C7931 Secondary malignant neoplasm of brain: Secondary | ICD-10-CM

## 2018-10-24 DIAGNOSIS — S32501A Unspecified fracture of right pubis, initial encounter for closed fracture: Principal | ICD-10-CM

## 2018-10-24 DIAGNOSIS — F419 Anxiety disorder, unspecified: Secondary | ICD-10-CM

## 2018-10-24 DIAGNOSIS — Z9181 History of falling: Secondary | ICD-10-CM

## 2018-10-24 DIAGNOSIS — E871 Hypo-osmolality and hyponatremia: Secondary | ICD-10-CM

## 2018-10-24 DIAGNOSIS — J309 Allergic rhinitis, unspecified: Secondary | ICD-10-CM

## 2018-10-24 DIAGNOSIS — G629 Polyneuropathy, unspecified: Secondary | ICD-10-CM

## 2018-10-24 DIAGNOSIS — E876 Hypokalemia: Secondary | ICD-10-CM

## 2018-10-24 DIAGNOSIS — Z9221 Personal history of antineoplastic chemotherapy: Secondary | ICD-10-CM

## 2018-10-24 DIAGNOSIS — I10 Essential (primary) hypertension: Secondary | ICD-10-CM

## 2018-10-24 DIAGNOSIS — F1020 Alcohol dependence, uncomplicated: Secondary | ICD-10-CM

## 2018-10-24 DIAGNOSIS — E785 Hyperlipidemia, unspecified: Secondary | ICD-10-CM

## 2018-10-24 DIAGNOSIS — G4700 Insomnia, unspecified: Secondary | ICD-10-CM

## 2018-10-26 DIAGNOSIS — F17210 Nicotine dependence, cigarettes, uncomplicated: Secondary | ICD-10-CM

## 2018-10-26 DIAGNOSIS — S32501A Unspecified fracture of right pubis, initial encounter for closed fracture: Principal | ICD-10-CM

## 2018-10-26 DIAGNOSIS — G629 Polyneuropathy, unspecified: Secondary | ICD-10-CM

## 2018-10-26 DIAGNOSIS — E785 Hyperlipidemia, unspecified: Secondary | ICD-10-CM

## 2018-10-26 DIAGNOSIS — G893 Neoplasm related pain (acute) (chronic): Secondary | ICD-10-CM

## 2018-10-26 DIAGNOSIS — F329 Major depressive disorder, single episode, unspecified: Secondary | ICD-10-CM

## 2018-10-26 DIAGNOSIS — E876 Hypokalemia: Secondary | ICD-10-CM

## 2018-10-26 DIAGNOSIS — G4700 Insomnia, unspecified: Secondary | ICD-10-CM

## 2018-10-26 DIAGNOSIS — F1020 Alcohol dependence, uncomplicated: Secondary | ICD-10-CM

## 2018-10-26 DIAGNOSIS — F419 Anxiety disorder, unspecified: Secondary | ICD-10-CM

## 2018-10-26 DIAGNOSIS — R1310 Dysphagia, unspecified: Secondary | ICD-10-CM

## 2018-10-26 DIAGNOSIS — K219 Gastro-esophageal reflux disease without esophagitis: Secondary | ICD-10-CM

## 2018-10-26 DIAGNOSIS — Z9221 Personal history of antineoplastic chemotherapy: Secondary | ICD-10-CM

## 2018-10-26 DIAGNOSIS — C50811 Malignant neoplasm of overlapping sites of right female breast: Secondary | ICD-10-CM

## 2018-10-26 DIAGNOSIS — I10 Essential (primary) hypertension: Secondary | ICD-10-CM

## 2018-10-26 DIAGNOSIS — R627 Adult failure to thrive: Secondary | ICD-10-CM

## 2018-10-26 DIAGNOSIS — J449 Chronic obstructive pulmonary disease, unspecified: Secondary | ICD-10-CM

## 2018-10-26 DIAGNOSIS — J309 Allergic rhinitis, unspecified: Secondary | ICD-10-CM

## 2018-10-26 DIAGNOSIS — E871 Hypo-osmolality and hyponatremia: Secondary | ICD-10-CM

## 2018-10-26 DIAGNOSIS — Z9181 History of falling: Secondary | ICD-10-CM

## 2018-10-26 DIAGNOSIS — C7931 Secondary malignant neoplasm of brain: Secondary | ICD-10-CM

## 2018-10-29 DIAGNOSIS — K219 Gastro-esophageal reflux disease without esophagitis: Secondary | ICD-10-CM

## 2018-10-29 DIAGNOSIS — E876 Hypokalemia: Secondary | ICD-10-CM

## 2018-10-29 DIAGNOSIS — R627 Adult failure to thrive: Secondary | ICD-10-CM

## 2018-10-29 DIAGNOSIS — F329 Major depressive disorder, single episode, unspecified: Secondary | ICD-10-CM

## 2018-10-29 DIAGNOSIS — C7931 Secondary malignant neoplasm of brain: Secondary | ICD-10-CM

## 2018-10-29 DIAGNOSIS — F419 Anxiety disorder, unspecified: Secondary | ICD-10-CM

## 2018-10-29 DIAGNOSIS — F1020 Alcohol dependence, uncomplicated: Secondary | ICD-10-CM

## 2018-10-29 DIAGNOSIS — E871 Hypo-osmolality and hyponatremia: Secondary | ICD-10-CM

## 2018-10-29 DIAGNOSIS — S32501A Unspecified fracture of right pubis, initial encounter for closed fracture: Principal | ICD-10-CM

## 2018-10-29 DIAGNOSIS — Z9181 History of falling: Secondary | ICD-10-CM

## 2018-10-29 DIAGNOSIS — Z9221 Personal history of antineoplastic chemotherapy: Secondary | ICD-10-CM

## 2018-10-29 DIAGNOSIS — E785 Hyperlipidemia, unspecified: Secondary | ICD-10-CM

## 2018-10-29 DIAGNOSIS — J449 Chronic obstructive pulmonary disease, unspecified: Secondary | ICD-10-CM

## 2018-10-29 DIAGNOSIS — J309 Allergic rhinitis, unspecified: Secondary | ICD-10-CM

## 2018-10-29 DIAGNOSIS — C50811 Malignant neoplasm of overlapping sites of right female breast: Secondary | ICD-10-CM

## 2018-10-29 DIAGNOSIS — R1310 Dysphagia, unspecified: Secondary | ICD-10-CM

## 2018-10-29 DIAGNOSIS — G4700 Insomnia, unspecified: Secondary | ICD-10-CM

## 2018-10-29 DIAGNOSIS — F17210 Nicotine dependence, cigarettes, uncomplicated: Secondary | ICD-10-CM

## 2018-10-29 DIAGNOSIS — G629 Polyneuropathy, unspecified: Secondary | ICD-10-CM

## 2018-10-29 DIAGNOSIS — I10 Essential (primary) hypertension: Secondary | ICD-10-CM

## 2018-10-29 DIAGNOSIS — G893 Neoplasm related pain (acute) (chronic): Secondary | ICD-10-CM

## 2018-10-30 DIAGNOSIS — G629 Polyneuropathy, unspecified: Secondary | ICD-10-CM

## 2018-10-30 DIAGNOSIS — F1020 Alcohol dependence, uncomplicated: Secondary | ICD-10-CM

## 2018-10-30 DIAGNOSIS — E785 Hyperlipidemia, unspecified: Secondary | ICD-10-CM

## 2018-10-30 DIAGNOSIS — G893 Neoplasm related pain (acute) (chronic): Secondary | ICD-10-CM

## 2018-10-30 DIAGNOSIS — Z9181 History of falling: Secondary | ICD-10-CM

## 2018-10-30 DIAGNOSIS — K219 Gastro-esophageal reflux disease without esophagitis: Secondary | ICD-10-CM

## 2018-10-30 DIAGNOSIS — C50811 Malignant neoplasm of overlapping sites of right female breast: Secondary | ICD-10-CM

## 2018-10-30 DIAGNOSIS — F17210 Nicotine dependence, cigarettes, uncomplicated: Secondary | ICD-10-CM

## 2018-10-30 DIAGNOSIS — E871 Hypo-osmolality and hyponatremia: Secondary | ICD-10-CM

## 2018-10-30 DIAGNOSIS — I10 Essential (primary) hypertension: Secondary | ICD-10-CM

## 2018-10-30 DIAGNOSIS — G4700 Insomnia, unspecified: Secondary | ICD-10-CM

## 2018-10-30 DIAGNOSIS — Z9221 Personal history of antineoplastic chemotherapy: Secondary | ICD-10-CM

## 2018-10-30 DIAGNOSIS — J449 Chronic obstructive pulmonary disease, unspecified: Secondary | ICD-10-CM

## 2018-10-30 DIAGNOSIS — R1310 Dysphagia, unspecified: Secondary | ICD-10-CM

## 2018-10-30 DIAGNOSIS — C7931 Secondary malignant neoplasm of brain: Secondary | ICD-10-CM

## 2018-10-30 DIAGNOSIS — F329 Major depressive disorder, single episode, unspecified: Secondary | ICD-10-CM

## 2018-10-30 DIAGNOSIS — E876 Hypokalemia: Secondary | ICD-10-CM

## 2018-10-30 DIAGNOSIS — F419 Anxiety disorder, unspecified: Secondary | ICD-10-CM

## 2018-10-30 DIAGNOSIS — J309 Allergic rhinitis, unspecified: Secondary | ICD-10-CM

## 2018-10-30 DIAGNOSIS — S32501A Unspecified fracture of right pubis, initial encounter for closed fracture: Principal | ICD-10-CM

## 2018-10-30 DIAGNOSIS — R627 Adult failure to thrive: Secondary | ICD-10-CM

## 2018-10-31 ENCOUNTER — Ambulatory Visit: Admit: 2018-10-31 | Discharge: 2018-11-02 | Payer: MEDICARE

## 2018-10-31 DIAGNOSIS — Z170 Estrogen receptor positive status [ER+]: Secondary | ICD-10-CM

## 2018-10-31 DIAGNOSIS — C7931 Secondary malignant neoplasm of brain: Principal | ICD-10-CM

## 2018-10-31 DIAGNOSIS — C50811 Malignant neoplasm of overlapping sites of right female breast: Secondary | ICD-10-CM

## 2018-11-01 ENCOUNTER — Ambulatory Visit: Admit: 2018-11-01 | Discharge: 2018-11-02 | Payer: MEDICARE

## 2018-11-01 ENCOUNTER — Ambulatory Visit
Admit: 2018-11-01 | Discharge: 2018-11-02 | Payer: MEDICARE | Attending: Student in an Organized Health Care Education/Training Program | Primary: Student in an Organized Health Care Education/Training Program

## 2018-11-01 ENCOUNTER — Ambulatory Visit: Admit: 2018-11-01 | Discharge: 2018-11-02 | Payer: MEDICARE | Attending: Psychiatry | Primary: Psychiatry

## 2018-11-01 ENCOUNTER — Other Ambulatory Visit: Admit: 2018-11-01 | Discharge: 2018-11-02 | Payer: MEDICARE

## 2018-11-01 ENCOUNTER — Ambulatory Visit: Admit: 2018-11-01 | Discharge: 2018-11-02 | Payer: MEDICARE | Attending: Adult Health | Primary: Adult Health

## 2018-11-01 DIAGNOSIS — F419 Anxiety disorder, unspecified: Secondary | ICD-10-CM

## 2018-11-01 DIAGNOSIS — C50811 Malignant neoplasm of overlapping sites of right female breast: Secondary | ICD-10-CM

## 2018-11-01 DIAGNOSIS — Z170 Estrogen receptor positive status [ER+]: Secondary | ICD-10-CM

## 2018-11-01 DIAGNOSIS — G620 Drug-induced polyneuropathy: Secondary | ICD-10-CM

## 2018-11-01 DIAGNOSIS — C7931 Secondary malignant neoplasm of brain: Principal | ICD-10-CM

## 2018-11-01 DIAGNOSIS — G893 Neoplasm related pain (acute) (chronic): Secondary | ICD-10-CM

## 2018-11-01 DIAGNOSIS — F329 Major depressive disorder, single episode, unspecified: Principal | ICD-10-CM

## 2018-11-01 DIAGNOSIS — R110 Nausea: Principal | ICD-10-CM

## 2018-11-01 DIAGNOSIS — Z515 Encounter for palliative care: Secondary | ICD-10-CM

## 2018-11-01 DIAGNOSIS — K529 Noninfective gastroenteritis and colitis, unspecified: Secondary | ICD-10-CM

## 2018-11-01 DIAGNOSIS — T451X5A Adverse effect of antineoplastic and immunosuppressive drugs, initial encounter: Secondary | ICD-10-CM

## 2018-11-01 DIAGNOSIS — R197 Diarrhea, unspecified: Principal | ICD-10-CM

## 2018-11-01 DIAGNOSIS — K5900 Constipation, unspecified: Secondary | ICD-10-CM

## 2018-11-01 MED ORDER — GABAPENTIN 300 MG CAPSULE
ORAL_CAPSULE | Freq: Three times a day (TID) | ORAL | 2 refills | 0.00000 days | Status: CP
Start: 2018-11-01 — End: 2019-01-03

## 2018-11-02 DIAGNOSIS — R627 Adult failure to thrive: Secondary | ICD-10-CM

## 2018-11-02 DIAGNOSIS — F329 Major depressive disorder, single episode, unspecified: Secondary | ICD-10-CM

## 2018-11-02 DIAGNOSIS — F1020 Alcohol dependence, uncomplicated: Secondary | ICD-10-CM

## 2018-11-02 DIAGNOSIS — I10 Essential (primary) hypertension: Secondary | ICD-10-CM

## 2018-11-02 DIAGNOSIS — J449 Chronic obstructive pulmonary disease, unspecified: Secondary | ICD-10-CM

## 2018-11-02 DIAGNOSIS — E871 Hypo-osmolality and hyponatremia: Secondary | ICD-10-CM

## 2018-11-02 DIAGNOSIS — C7931 Secondary malignant neoplasm of brain: Secondary | ICD-10-CM

## 2018-11-02 DIAGNOSIS — F17210 Nicotine dependence, cigarettes, uncomplicated: Secondary | ICD-10-CM

## 2018-11-02 DIAGNOSIS — S32501A Unspecified fracture of right pubis, initial encounter for closed fracture: Principal | ICD-10-CM

## 2018-11-02 DIAGNOSIS — K219 Gastro-esophageal reflux disease without esophagitis: Secondary | ICD-10-CM

## 2018-11-02 DIAGNOSIS — Z9181 History of falling: Secondary | ICD-10-CM

## 2018-11-02 DIAGNOSIS — E785 Hyperlipidemia, unspecified: Secondary | ICD-10-CM

## 2018-11-02 DIAGNOSIS — G4700 Insomnia, unspecified: Secondary | ICD-10-CM

## 2018-11-02 DIAGNOSIS — J309 Allergic rhinitis, unspecified: Secondary | ICD-10-CM

## 2018-11-02 DIAGNOSIS — Z9221 Personal history of antineoplastic chemotherapy: Secondary | ICD-10-CM

## 2018-11-02 DIAGNOSIS — F419 Anxiety disorder, unspecified: Secondary | ICD-10-CM

## 2018-11-02 DIAGNOSIS — C50811 Malignant neoplasm of overlapping sites of right female breast: Secondary | ICD-10-CM

## 2018-11-02 DIAGNOSIS — R1310 Dysphagia, unspecified: Secondary | ICD-10-CM

## 2018-11-02 DIAGNOSIS — G893 Neoplasm related pain (acute) (chronic): Secondary | ICD-10-CM

## 2018-11-02 DIAGNOSIS — G629 Polyneuropathy, unspecified: Secondary | ICD-10-CM

## 2018-11-02 DIAGNOSIS — E876 Hypokalemia: Secondary | ICD-10-CM

## 2018-11-13 MED ORDER — CLONAZEPAM 1 MG TABLET
ORAL_TABLET | Freq: Two times a day (BID) | ORAL | 0 refills | 0 days | Status: SS
Start: 2018-11-13 — End: 2019-01-17

## 2018-11-13 MED ORDER — DULOXETINE 30 MG CAPSULE,DELAYED RELEASE
ORAL_CAPSULE | 0 refills | 0 days | Status: CP
Start: 2018-11-13 — End: 2018-12-15

## 2018-11-14 MED ORDER — ESOMEPRAZOLE MAGNESIUM 40 MG CAPSULE,DELAYED RELEASE
ORAL_CAPSULE | Freq: Every day | ORAL | 0 refills | 0 days | Status: CP
Start: 2018-11-14 — End: 2018-12-06

## 2018-11-23 ENCOUNTER — Ambulatory Visit: Admit: 2018-11-23 | Discharge: 2018-11-24 | Payer: MEDICARE

## 2018-11-23 DIAGNOSIS — C50811 Malignant neoplasm of overlapping sites of right female breast: Secondary | ICD-10-CM

## 2018-11-23 DIAGNOSIS — R110 Nausea: Principal | ICD-10-CM

## 2018-11-26 ENCOUNTER — Ambulatory Visit: Admit: 2018-11-26 | Discharge: 2018-11-27 | Payer: MEDICARE

## 2018-11-26 DIAGNOSIS — E039 Hypothyroidism, unspecified: Secondary | ICD-10-CM

## 2018-11-26 DIAGNOSIS — Z0000 Encounter for general adult medical examination without abnormal findings: Principal | ICD-10-CM

## 2018-11-26 DIAGNOSIS — R7989 Other specified abnormal findings of blood chemistry: Secondary | ICD-10-CM

## 2018-11-26 MED ORDER — VARICELLA-ZOSTER GLYCOE VACC-AS01B ADJ(PF) 50 MCG/0.5 ML IM SUSP, KIT
Freq: Once | INTRAMUSCULAR | 0 refills | 0 days | Status: CP
Start: 2018-11-26 — End: 2018-11-26

## 2018-11-27 ENCOUNTER — Ambulatory Visit: Admit: 2018-11-27 | Discharge: 2018-11-28 | Payer: MEDICARE

## 2018-11-27 DIAGNOSIS — Z515 Encounter for palliative care: Secondary | ICD-10-CM

## 2018-11-27 DIAGNOSIS — G4700 Insomnia, unspecified: Principal | ICD-10-CM

## 2018-11-27 DIAGNOSIS — S32591A Other specified fracture of right pubis, initial encounter for closed fracture: Principal | ICD-10-CM

## 2018-11-27 DIAGNOSIS — M549 Dorsalgia, unspecified: Secondary | ICD-10-CM

## 2018-12-06 MED ORDER — ESOMEPRAZOLE MAGNESIUM 40 MG CAPSULE,DELAYED RELEASE
ORAL_CAPSULE | Freq: Every day | ORAL | 0 refills | 0.00000 days | Status: CP
Start: 2018-12-06 — End: 2019-01-07

## 2018-12-10 ENCOUNTER — Encounter: Payer: Self-pay | Admitting: Emergency Medicine

## 2018-12-10 ENCOUNTER — Emergency Department
Admission: EM | Admit: 2018-12-10 | Discharge: 2018-12-10 | Disposition: A | Discharge status: Home / Self Care | Payer: Medicare Other | Attending: Emergency Medicine | Admitting: Emergency Medicine

## 2018-12-10 ENCOUNTER — Other Ambulatory Visit: Payer: Self-pay

## 2018-12-10 DIAGNOSIS — G9009 Other idiopathic peripheral autonomic neuropathy: Secondary | ICD-10-CM | POA: Insufficient documentation

## 2018-12-10 DIAGNOSIS — Z79899 Other long term (current) drug therapy: Secondary | ICD-10-CM | POA: Diagnosis not present

## 2018-12-10 DIAGNOSIS — F17200 Nicotine dependence, unspecified, uncomplicated: Secondary | ICD-10-CM | POA: Insufficient documentation

## 2018-12-10 DIAGNOSIS — G6289 Other specified polyneuropathies: Secondary | ICD-10-CM | POA: Insufficient documentation

## 2018-12-10 DIAGNOSIS — Z853 Personal history of malignant neoplasm of breast: Secondary | ICD-10-CM | POA: Diagnosis not present

## 2018-12-10 DIAGNOSIS — M79673 Pain in unspecified foot: Secondary | ICD-10-CM | POA: Diagnosis present

## 2018-12-10 DIAGNOSIS — Z9221 Personal history of antineoplastic chemotherapy: Secondary | ICD-10-CM | POA: Diagnosis not present

## 2018-12-10 HISTORY — DX: Neoplasm of unspecified behavior of brain: D49.6

## 2018-12-10 HISTORY — DX: Polyneuropathy, unspecified: G62.9

## 2018-12-10 HISTORY — DX: Malignant neoplasm of unspecified site of unspecified female breast: C50.919

## 2018-12-10 LAB — POCT I-STAT, CHEM 8
BUN: <3 mg/dL — ABNORMAL LOW (ref 6–20)
CALCIUM ION: 1.09 mmol/L — AB (ref 1.15–1.40)
Chloride: 108 mmol/L (ref 98–111)
Creatinine, Ser: 0.8 mg/dL (ref 0.44–1.00)
Glucose, Bld: 91 mg/dL (ref 70–99)
HCT: 40 % (ref 36.0–46.0)
HEMOGLOBIN: 13.6 g/dL (ref 12.0–15.0)
Potassium: 3.8 mmol/L (ref 3.5–5.1)
SODIUM: 141 mmol/L (ref 135–145)
TCO2: 25 mmol/L (ref 22–32)

## 2018-12-10 MED ORDER — ANASTROZOLE 1 MG TABLET
ORAL_TABLET | Freq: Every day | ORAL | 0 refills | 0 days | Status: CP
Start: 2018-12-10 — End: 2019-01-23

## 2018-12-10 MED ORDER — MIDAZOLAM HCL 2 MG/2ML IJ SOLN
2.0000 mg | Freq: Once | INTRAMUSCULAR | Status: AC
  Administered 2018-12-10: 2 mg via NASAL

## 2018-12-10 MED ORDER — BUPIVACAINE HCL (PF) 0.5 % IJ SOLN
30.0000 mL | Freq: Once | INTRAMUSCULAR | Status: DC
  Filled 2018-12-10: qty 30

## 2018-12-10 MED ORDER — MIDAZOLAM HCL 2 MG/2ML IJ SOLN
3.0000 mg | Freq: Once | INTRAMUSCULAR | Status: DC
  Filled 2018-12-10: qty 4

## 2018-12-10 NOTE — ED Triage Notes (Signed)
Pt arrived via EMS with c/o bilateral foot pain, worse on the right, for 3 years, worse in the last 4 hours; pt with history of neuropathy and takes Gabapentin 3 times daily; pt admits to having several glasses of wine tonight;

## 2018-12-10 NOTE — Discharge Instructions (Signed)
Please follow up with your PMD and your Oncologist as scheduled and return to the ED for any concerns.  It was a pleasure to take care of you today, and thank you for coming to our emergency department.  If you have any questions or concerns before leaving please ask the nurse to grab me and I'm more than happy to go through your aftercare instructions again.  If you have any concerns once you are home that you are not improving or are in fact getting worse before you can make it to your follow-up appointment, please do not hesitate to call [phone] and come back for further evaluation.  Darel Hong, MD  Results for orders placed or performed during the hospital encounter of 12/10/18  I-STAT, chem 8  Result Value Ref Range   Sodium 141 135 - 145 mmol/L   Potassium 3.8 3.5 - 5.1 mmol/L   Chloride 108 98 - 111 mmol/L   BUN <3 (L) 6 - 20 mg/dL   Creatinine, Ser 0.80 0.44 - 1.00 mg/dL   Glucose, Bld 91 70 - 99 mg/dL   Calcium, Ion 1.09 (L) 1.15 - 1.40 mmol/L   TCO2 25 22 - 32 mmol/L   Hemoglobin 13.6 12.0 - 15.0 g/dL   HCT 40.0 36.0 - 46.0 %

## 2018-12-10 NOTE — ED Provider Notes (Signed)
[facility] Emergency Department Provider Note  ____________________________________________   First MD Initiated Contact with Patient 12/10/18 0216     (approximate)  I have reviewed the triage vital signs and the nursing notes.   HISTORY  Chief Complaint Foot Pain    HPI Barbara Malone is a 62 y.o. female who comes to the emergency department via EMS with an acute exacerbation of her chronic bilateral foot neuropathy.  She is had neuropathy for the past 3 years after undergoing chemotherapy for breast cancer.  The pain is described as burning and aching primarily on the plantar surfaces of both feet.  Tonight it is acutely worse primarily on the right.  Pain is worse when walking and improved with rest.  She is currently taking 900 mg of Neurontin in the morning 600 mg in the afternoon and 900 mg at night.  He denies trauma.  No history of diabetes.  She did drink alcohol this evening which is not unusual for her.    Past Medical History:  Diagnosis Date  . Anxiety   . Brain tumor ([place])   . Cancer [facility])    breast, metastatic, active  . Depression    still active; takes meds for depression;   . Hyperlipidemia    controllled with medication;   Marland Kitchen Metastatic breast cancer ([place])   . Peripheral neuropathy     There are no active problems to display for this patient.   Past Surgical History:  Procedure Laterality Date  . CARPAL TUNNEL RELEASE    . CESAREAN SECTION    . TRIGGER FINGER RELEASE      Prior to Admission medications   Medication Sig Start Date End Date Taking? Authorizing Provider  anastrozole (ARIMIDEX) 1 MG tablet Take 1 mg by mouth daily.   Yes [provider]  clonazePAM (KLONOPIN) 1 MG tablet Take 1 mg by mouth 2 (two) times daily.   Yes [provider]  DULoxetine (CYMBALTA) 20 MG capsule Take 20 mg by mouth daily.   Yes [provider]  esomeprazole (NEXIUM) 40 MG capsule Take 40 mg by mouth daily at 12  noon.   Yes [provider]  lisinopril (PRINIVIL,ZESTRIL) 10 MG tablet Take 10 mg by mouth daily.   Yes [provider]  prochlorperazine (COMPAZINE) 10 MG tablet Take 10 mg by mouth every 6 (six) hours as needed for nausea or vomiting.   Yes [provider]    Allergies Oxycodone; Tape; Tetracyclines & related; and Vicodin [hydrocodone-acetaminophen]  History reviewed. No pertinent family history.  Social History Social History   Tobacco Use  . Smoking status: Current Every Day Smoker    Packs/day: 1.00  . Smokeless tobacco: Never Used  Substance Use Topics  . Alcohol use: Yes    Alcohol/week: 20.0 standard drinks    Types: 20 Glasses of wine per week  . Drug use: No    Review of Systems Constitutional: No fever/chills ENT: No sore throat. Cardiovascular: Denies chest pain. Respiratory: Denies shortness of breath. Gastrointestinal: No abdominal pain.  No nausea, no vomiting.   Musculoskeletal: Positive for foot pain Neurological: Positive for bilateral foot neuropathy   ____________________________________________   PHYSICAL EXAM:  VITAL SIGNS: ED Triage Vitals  Enc Vitals Group     BP 12/10/18 0124 123/75     Pulse Rate 12/10/18 0124 84     Resp 12/10/18 0124 17     Temp 12/10/18 0124 97.8 F (36.6 C)     Temp  Source 12/10/18 0124 Oral     SpO2 12/10/18 0124 100 %     Weight 12/10/18 0125 100 lb (45.4 kg)     Height 12/10/18 0125 5' (1.524 m)     Head Circumference --      Peak Flow --      Pain Score 12/10/18 0124 8     Pain Loc --      Pain Edu? --      Excl. in [place]? --     Constitutional: Alert and oriented x4 nontoxic no diaphoresis joking laughing and very funny Head: Atraumatic. Nose: No congestion/rhinnorhea. Mouth/Throat: No trismus Neck: No stridor.   Cardiovascular: Regular rate and rhythm Respiratory: Normal respiratory effort.  No retractions. MSK: No tenderness over medial malleolus or lateral malleolus or for  6 cm proximal No tenderness over navicular, midfoot, or fifth metatarsal 2+ dorsalis pedis pulse Skin closed Compartments soft Patient can fire extensor hallucis longus, extensor digitorum longus, flexor hallucis longus, flexor digitorum longus, tibialis anterior, and gastrocnemius Sensation intact to light touch to sural, saphenous, deep peroneal, superficial peroneal, and tibial nerve She does have quite a bit of discomfort in the tibial distribution bilaterally Neurologic:  Normal speech and language. No gross focal neurologic deficits are appreciated.  Skin:  Skin is warm, dry and intact. No rash noted.    ____________________________________________  LABS (all labs ordered are listed, but only abnormal results are displayed)  Labs Reviewed  POCT I-STAT, CHEM 8 - Abnormal; Notable for the following components:      Result Value   BUN <3 (*)    Calcium, Ion 1.09 (*)    All other components within normal limits    Lab work reviewed by me with no clear etiology of her neuropathy identified __________________________________________  EKG   ____________________________________________  RADIOLOGY   ____________________________________________   DIFFERENTIAL includes but not limited to  Hypocalcemia, dehydration, chronic pain, fracture   PROCEDURES  Procedure(s) performed: Yes  .Nerve Block Date/Time: 12/10/2018 3:17 AM Performed by: Darel Hong, MD Authorized by: Darel Hong, MD   Consent:    Consent obtained:  Verbal   Consent given by:  Patient   Risks discussed:  Allergic reaction, intravenous injection, pain and unsuccessful block   Alternatives discussed:  Alternative treatment Indications:    Indications:  Pain relief Location:    Body area:  Lower extremity   Lower extremity nerve blocked: tibial.   Laterality:  Left Pre-procedure details:    Skin preparation:  2% chlorhexidine   Preparation: Patient was prepped and draped in usual  sterile fashion   Procedure details (see MAR for exact dosages):    Block needle gauge:  25 G   Guidance: ultrasound     Anesthetic injected:  Bupivacaine 0.5% w/o epi   Steroid injected:  None   Additive injected:  None   Injection procedure:  Anatomic landmarks identified, anatomic landmarks palpated and negative aspiration for blood Post-procedure details:    Dressing:  None   Outcome:  Pain relieved   Patient tolerance of procedure:  Tolerated well, no immediate complications Comments:     Total of 3cc of bupivicaine used under direct US guidance with near complete anesthesia achieved .Nerve Block Date/Time: 12/10/2018 3:17 AM Performed by: Darel Hong, MD Authorized by: Darel Hong, MD   Consent:    Consent obtained:  Verbal   Consent given by:  Patient   Risks discussed:  Intravenous injection, pain, unsuccessful block and allergic reaction   Alternatives discussed:  Alternative treatment Indications:    Indications:  Pain relief Location:    Body area:  Lower extremity   Lower extremity nerve blocked: tibial.   Laterality:  Right Pre-procedure details:    Skin preparation:  2% chlorhexidine   Preparation: Patient was prepped and draped in usual sterile fashion   Procedure details (see MAR for exact dosages):    Block needle gauge:  25 G   Guidance: ultrasound     Anesthetic injected:  Bupivacaine 0.5% w/o epi   Steroid injected:  None   Additive injected:  None   Injection procedure:  Anatomic landmarks identified, anatomic landmarks palpated, incremental injection and negative aspiration for blood   Paresthesia:  Immediately resolved Post-procedure details:    Dressing:  None   Outcome:  Pain relieved   Patient tolerance of procedure:  Tolerated well, no immediate complications Comments:     Total of 4cc of bupivicaine injected under direct US guidance    Critical Care performed: no  ____________________________________________   INITIAL  IMPRESSION / ASSESSMENT AND PLAN / ED COURSE  Pertinent labs & imaging results that were available during my care of the patient were reviewed by me and considered in my medical decision making (see chart for details).   As part of my medical decision making, I reviewed the following data within the [place] History obtained from family if available, nursing notes, old chart and ekg, as well as notes from prior ED visits.  The patient comes to the emergency department quite uncomfortable appearing with neuropathy to the bottom of both of her feet.  This is chronic but she is had an acute exacerbation.  Is secondary to previous chemotherapy.  She has had metabolic derangements in the past so I obtained an i-STAT with no electrolyte abnormalities which would explain her symptoms.  Offered the patient bilateral tibial nerve blocks for pain relief and she accepted and the blocks were performed in the usual sterile fashion with direct ultrasound guidance achieving near complete anesthesia and significant relief of her pain.  She has primary care and oncology follow-up already scheduled.  Discharged home with her sister in improved condition.      ____________________________________________   FINAL CLINICAL IMPRESSION(S) / ED DIAGNOSES  Final diagnoses:  Other polyneuropathy      NEW MEDICATIONS STARTED DURING THIS VISIT:  Discharge Medication List as of 12/10/2018  3:17 AM       Note:  This document was prepared using Dragon voice recognition software and may include unintentional dictation errors.      Darel Hong, MD 12/10/18 [phone]

## 2018-12-10 NOTE — ED Notes (Signed)
Patient to ED via [facility] for complaints of peripheral neuropathy in bilateral feet. Patient is also ETOH+ . BP 151/82, p93, R 16. Patient is calm and cooperative.

## 2018-12-12 ENCOUNTER — Ambulatory Visit: Admit: 2018-12-12 | Discharge: 2018-12-25 | Payer: MEDICARE

## 2018-12-12 ENCOUNTER — Other Ambulatory Visit: Admit: 2018-12-12 | Discharge: 2018-12-25 | Payer: MEDICARE

## 2018-12-12 ENCOUNTER — Ambulatory Visit
Admit: 2018-12-12 | Discharge: 2018-12-25 | Payer: MEDICARE | Attending: Radiation Oncology | Primary: Radiation Oncology

## 2018-12-12 DIAGNOSIS — C50811 Malignant neoplasm of overlapping sites of right female breast: Secondary | ICD-10-CM

## 2018-12-12 DIAGNOSIS — C7931 Secondary malignant neoplasm of brain: Principal | ICD-10-CM

## 2018-12-12 DIAGNOSIS — F5101 Primary insomnia: Secondary | ICD-10-CM

## 2018-12-12 DIAGNOSIS — R110 Nausea: Principal | ICD-10-CM

## 2018-12-12 DIAGNOSIS — E039 Hypothyroidism, unspecified: Secondary | ICD-10-CM

## 2018-12-12 MED ORDER — DEXAMETHASONE 2 MG TABLET
ORAL_TABLET | 0 refills | 0 days | Status: CP
Start: 2018-12-12 — End: 2019-01-17

## 2018-12-12 MED ORDER — TRAZODONE 50 MG TABLET
ORAL_TABLET | Freq: Every evening | ORAL | 3 refills | 0.00000 days | Status: CP
Start: 2018-12-12 — End: 2019-01-03

## 2018-12-15 MED ORDER — DULOXETINE 30 MG CAPSULE,DELAYED RELEASE
ORAL_CAPSULE | 0 refills | 0 days | Status: CP
Start: 2018-12-15 — End: 2019-01-18

## 2018-12-15 MED ORDER — NICOTINE (POLACRILEX) 2 MG GUM
BUCCAL | 0 refills | 0.00000 days | Status: SS | PRN
Start: 2018-12-15 — End: 2019-01-17

## 2018-12-20 ENCOUNTER — Ambulatory Visit: Admit: 2018-12-20 | Discharge: 2018-12-21 | Payer: MEDICARE | Attending: Family | Primary: Family

## 2018-12-20 DIAGNOSIS — J028 Acute pharyngitis due to other specified organisms: Principal | ICD-10-CM

## 2018-12-20 DIAGNOSIS — C7931 Secondary malignant neoplasm of brain: Secondary | ICD-10-CM

## 2018-12-20 DIAGNOSIS — G620 Drug-induced polyneuropathy: Secondary | ICD-10-CM

## 2018-12-20 MED ORDER — AMOXICILLIN 875 MG TABLET
ORAL_TABLET | Freq: Two times a day (BID) | ORAL | 0 refills | 0 days | Status: CP
Start: 2018-12-20 — End: 2019-01-17

## 2018-12-28 ENCOUNTER — Ambulatory Visit
Admit: 2018-12-28 | Discharge: 2018-12-29 | Payer: MEDICARE | Attending: Neurological Surgery | Primary: Neurological Surgery

## 2018-12-28 DIAGNOSIS — C7931 Secondary malignant neoplasm of brain: Principal | ICD-10-CM

## 2018-12-28 DIAGNOSIS — Z419 Encounter for procedure for purposes other than remedying health state, unspecified: Secondary | ICD-10-CM

## 2019-01-03 ENCOUNTER — Ambulatory Visit
Admit: 2019-01-03 | Discharge: 2019-01-03 | Payer: MEDICARE | Attending: Geriatric Medicine | Primary: Geriatric Medicine

## 2019-01-03 ENCOUNTER — Ambulatory Visit: Admit: 2019-01-03 | Discharge: 2019-01-03 | Payer: MEDICARE

## 2019-01-03 DIAGNOSIS — C50811 Malignant neoplasm of overlapping sites of right female breast: Secondary | ICD-10-CM

## 2019-01-03 DIAGNOSIS — F5101 Primary insomnia: Principal | ICD-10-CM

## 2019-01-03 DIAGNOSIS — T451X5A Adverse effect of antineoplastic and immunosuppressive drugs, initial encounter: Secondary | ICD-10-CM

## 2019-01-03 DIAGNOSIS — R110 Nausea: Principal | ICD-10-CM

## 2019-01-03 DIAGNOSIS — G620 Drug-induced polyneuropathy: Secondary | ICD-10-CM

## 2019-01-03 MED ORDER — TRAZODONE 50 MG TABLET
ORAL_TABLET | Freq: Every evening | ORAL | 3 refills | 0 days | Status: CP
Start: 2019-01-03 — End: 2019-05-09

## 2019-01-03 MED ORDER — GABAPENTIN 300 MG CAPSULE
ORAL_CAPSULE | Freq: Three times a day (TID) | ORAL | 2 refills | 0.00000 days | Status: CP
Start: 2019-01-03 — End: 2019-03-04

## 2019-01-07 MED ORDER — ESOMEPRAZOLE MAGNESIUM 40 MG CAPSULE,DELAYED RELEASE
ORAL_CAPSULE | Freq: Every day | ORAL | 0 refills | 0 days | Status: CP
Start: 2019-01-07 — End: 2019-02-04

## 2019-01-14 DIAGNOSIS — C7931 Secondary malignant neoplasm of brain: Principal | ICD-10-CM

## 2019-01-15 ENCOUNTER — Encounter
Admit: 2019-01-15 | Discharge: 2019-01-17 | Disposition: A | Discharge status: Home Health | Payer: MEDICARE | Attending: Student in an Organized Health Care Education/Training Program | Admitting: Neurological Surgery

## 2019-01-15 ENCOUNTER — Ambulatory Visit
Admit: 2019-01-15 | Discharge: 2019-01-17 | Disposition: A | Discharge status: Home Health | Payer: MEDICARE | Admitting: Neurological Surgery

## 2019-01-15 DIAGNOSIS — C7931 Secondary malignant neoplasm of brain: Principal | ICD-10-CM

## 2019-01-17 DIAGNOSIS — Z6821 Body mass index (BMI) 21.0-21.9, adult: Secondary | ICD-10-CM

## 2019-01-17 DIAGNOSIS — F419 Anxiety disorder, unspecified: Secondary | ICD-10-CM

## 2019-01-17 DIAGNOSIS — R1310 Dysphagia, unspecified: Secondary | ICD-10-CM

## 2019-01-17 DIAGNOSIS — Z9181 History of falling: Secondary | ICD-10-CM

## 2019-01-17 DIAGNOSIS — R627 Adult failure to thrive: Secondary | ICD-10-CM

## 2019-01-17 DIAGNOSIS — J309 Allergic rhinitis, unspecified: Secondary | ICD-10-CM

## 2019-01-17 DIAGNOSIS — I10 Essential (primary) hypertension: Secondary | ICD-10-CM

## 2019-01-17 DIAGNOSIS — G4700 Insomnia, unspecified: Secondary | ICD-10-CM

## 2019-01-17 DIAGNOSIS — F329 Major depressive disorder, single episode, unspecified: Secondary | ICD-10-CM

## 2019-01-17 DIAGNOSIS — C7931 Secondary malignant neoplasm of brain: Principal | ICD-10-CM

## 2019-01-17 DIAGNOSIS — J449 Chronic obstructive pulmonary disease, unspecified: Secondary | ICD-10-CM

## 2019-01-17 DIAGNOSIS — Z483 Aftercare following surgery for neoplasm: Principal | ICD-10-CM

## 2019-01-17 DIAGNOSIS — T451X5D Adverse effect of antineoplastic and immunosuppressive drugs, subsequent encounter: Secondary | ICD-10-CM

## 2019-01-17 DIAGNOSIS — G620 Drug-induced polyneuropathy: Secondary | ICD-10-CM

## 2019-01-17 DIAGNOSIS — F1020 Alcohol dependence, uncomplicated: Secondary | ICD-10-CM

## 2019-01-17 DIAGNOSIS — F17210 Nicotine dependence, cigarettes, uncomplicated: Secondary | ICD-10-CM

## 2019-01-17 DIAGNOSIS — K529 Noninfective gastroenteritis and colitis, unspecified: Secondary | ICD-10-CM

## 2019-01-17 MED ORDER — LEVETIRACETAM 500 MG TABLET
ORAL_TABLET | Freq: Two times a day (BID) | ORAL | 0 refills | 0.00000 days | Status: CP
Start: 2019-01-17 — End: 2019-01-30
  Filled 2019-01-17: qty 10, 5d supply, fill #0

## 2019-01-17 MED ORDER — DEXAMETHASONE 1 MG TABLET
ORAL_TABLET | INTRAMUSCULAR | 0 refills | 0.00000 days | Status: CP
Start: 2019-01-17 — End: 2019-01-25
  Filled 2019-01-17: qty 31, 10d supply, fill #0

## 2019-01-17 MED ORDER — CLONAZEPAM 1 MG TABLET
ORAL_TABLET | Freq: Two times a day (BID) | ORAL | 0 refills | 0.00000 days | Status: CP
Start: 2019-01-17 — End: 2019-01-31
  Filled 2019-01-17: qty 10, 5d supply, fill #0

## 2019-01-17 MED ORDER — HYDROCODONE 5 MG-ACETAMINOPHEN 325 MG TABLET
ORAL_TABLET | ORAL | 0 refills | 0.00000 days | Status: CP | PRN
Start: 2019-01-17 — End: 2019-01-22
  Filled 2019-01-17: qty 30, 5d supply, fill #0

## 2019-01-17 MED ORDER — OLANZAPINE 2.5 MG TABLET
ORAL_TABLET | Freq: Every evening | ORAL | 0 refills | 0.00000 days
Start: 2019-01-17 — End: 2019-01-30

## 2019-01-17 MED ORDER — NICOTINE (POLACRILEX) 2 MG GUM
BUCCAL | 0 refills | 0.00000 days | Status: CP | PRN
Start: 2019-01-17 — End: 2019-02-16
  Filled 2019-01-17: qty 110, 10d supply, fill #0

## 2019-01-17 MED ORDER — POTASSIUM CHLORIDE ER 10 MEQ TABLET,EXTENDED RELEASE(PART/CRYST)
ORAL_TABLET | Freq: Every day | ORAL | 0 refills | 0 days | Status: CP
Start: 2019-01-17 — End: 2019-03-12

## 2019-01-17 MED ORDER — LISINOPRIL 20 MG TABLET
ORAL_TABLET | Freq: Every day | ORAL | 0 refills | 0.00000 days | Status: CP
Start: 2019-01-17 — End: 2019-08-13

## 2019-01-17 MED FILL — DEXAMETHASONE 1 MG TABLET: 10 days supply | Qty: 31 | Fill #0 | Status: AC

## 2019-01-17 MED FILL — HYDROCODONE 5 MG-ACETAMINOPHEN 325 MG TABLET: 5 days supply | Qty: 30 | Fill #0 | Status: AC

## 2019-01-17 MED FILL — LEVETIRACETAM 500 MG TABLET: 5 days supply | Qty: 10 | Fill #0 | Status: AC

## 2019-01-17 MED FILL — NICOTINE (POLACRILEX) 2 MG GUM: 10 days supply | Qty: 110 | Fill #0 | Status: AC

## 2019-01-17 MED FILL — CLONAZEPAM 1 MG TABLET: 5 days supply | Qty: 10 | Fill #0 | Status: AC

## 2019-01-18 ENCOUNTER — Encounter: Admit: 2019-01-18 | Discharge: 2019-02-14 | Payer: MEDICARE

## 2019-01-18 ENCOUNTER — Inpatient Hospital Stay: Admit: 2019-01-18 | Discharge: 2019-02-14 | Payer: MEDICARE

## 2019-01-18 DIAGNOSIS — J309 Allergic rhinitis, unspecified: Secondary | ICD-10-CM

## 2019-01-18 DIAGNOSIS — G620 Drug-induced polyneuropathy: Secondary | ICD-10-CM

## 2019-01-18 DIAGNOSIS — J449 Chronic obstructive pulmonary disease, unspecified: Secondary | ICD-10-CM

## 2019-01-18 DIAGNOSIS — C7931 Secondary malignant neoplasm of brain: Secondary | ICD-10-CM

## 2019-01-18 DIAGNOSIS — F329 Major depressive disorder, single episode, unspecified: Secondary | ICD-10-CM

## 2019-01-18 DIAGNOSIS — T451X5D Adverse effect of antineoplastic and immunosuppressive drugs, subsequent encounter: Secondary | ICD-10-CM

## 2019-01-18 DIAGNOSIS — I10 Essential (primary) hypertension: Secondary | ICD-10-CM

## 2019-01-18 DIAGNOSIS — F419 Anxiety disorder, unspecified: Secondary | ICD-10-CM

## 2019-01-18 DIAGNOSIS — R1310 Dysphagia, unspecified: Secondary | ICD-10-CM

## 2019-01-18 DIAGNOSIS — Z483 Aftercare following surgery for neoplasm: Principal | ICD-10-CM

## 2019-01-18 DIAGNOSIS — Z6821 Body mass index (BMI) 21.0-21.9, adult: Secondary | ICD-10-CM

## 2019-01-18 DIAGNOSIS — G4700 Insomnia, unspecified: Secondary | ICD-10-CM

## 2019-01-18 DIAGNOSIS — F1020 Alcohol dependence, uncomplicated: Secondary | ICD-10-CM

## 2019-01-18 DIAGNOSIS — K529 Noninfective gastroenteritis and colitis, unspecified: Secondary | ICD-10-CM

## 2019-01-18 DIAGNOSIS — R627 Adult failure to thrive: Secondary | ICD-10-CM

## 2019-01-18 DIAGNOSIS — Z9181 History of falling: Secondary | ICD-10-CM

## 2019-01-18 DIAGNOSIS — F17210 Nicotine dependence, cigarettes, uncomplicated: Secondary | ICD-10-CM

## 2019-01-18 MED ORDER — DULOXETINE 30 MG CAPSULE,DELAYED RELEASE
ORAL_CAPSULE | 0 refills | 0 days | Status: CP
Start: 2019-01-18 — End: 2019-02-14

## 2019-01-21 DIAGNOSIS — Z6821 Body mass index (BMI) 21.0-21.9, adult: Secondary | ICD-10-CM

## 2019-01-21 DIAGNOSIS — R1310 Dysphagia, unspecified: Secondary | ICD-10-CM

## 2019-01-21 DIAGNOSIS — Z483 Aftercare following surgery for neoplasm: Principal | ICD-10-CM

## 2019-01-21 DIAGNOSIS — I10 Essential (primary) hypertension: Secondary | ICD-10-CM

## 2019-01-21 DIAGNOSIS — K529 Noninfective gastroenteritis and colitis, unspecified: Secondary | ICD-10-CM

## 2019-01-21 DIAGNOSIS — F17210 Nicotine dependence, cigarettes, uncomplicated: Secondary | ICD-10-CM

## 2019-01-21 DIAGNOSIS — F329 Major depressive disorder, single episode, unspecified: Secondary | ICD-10-CM

## 2019-01-21 DIAGNOSIS — J449 Chronic obstructive pulmonary disease, unspecified: Secondary | ICD-10-CM

## 2019-01-21 DIAGNOSIS — F419 Anxiety disorder, unspecified: Secondary | ICD-10-CM

## 2019-01-21 DIAGNOSIS — F1020 Alcohol dependence, uncomplicated: Secondary | ICD-10-CM

## 2019-01-21 DIAGNOSIS — T451X5D Adverse effect of antineoplastic and immunosuppressive drugs, subsequent encounter: Secondary | ICD-10-CM

## 2019-01-21 DIAGNOSIS — C7931 Secondary malignant neoplasm of brain: Secondary | ICD-10-CM

## 2019-01-21 DIAGNOSIS — G620 Drug-induced polyneuropathy: Secondary | ICD-10-CM

## 2019-01-21 DIAGNOSIS — R627 Adult failure to thrive: Secondary | ICD-10-CM

## 2019-01-21 DIAGNOSIS — J309 Allergic rhinitis, unspecified: Secondary | ICD-10-CM

## 2019-01-21 DIAGNOSIS — Z9181 History of falling: Secondary | ICD-10-CM

## 2019-01-21 DIAGNOSIS — G4700 Insomnia, unspecified: Secondary | ICD-10-CM

## 2019-01-22 DIAGNOSIS — Z9181 History of falling: Secondary | ICD-10-CM

## 2019-01-22 DIAGNOSIS — R627 Adult failure to thrive: Secondary | ICD-10-CM

## 2019-01-22 DIAGNOSIS — G620 Drug-induced polyneuropathy: Secondary | ICD-10-CM

## 2019-01-22 DIAGNOSIS — F1020 Alcohol dependence, uncomplicated: Secondary | ICD-10-CM

## 2019-01-22 DIAGNOSIS — R1310 Dysphagia, unspecified: Secondary | ICD-10-CM

## 2019-01-22 DIAGNOSIS — I10 Essential (primary) hypertension: Secondary | ICD-10-CM

## 2019-01-22 DIAGNOSIS — G4700 Insomnia, unspecified: Secondary | ICD-10-CM

## 2019-01-22 DIAGNOSIS — J449 Chronic obstructive pulmonary disease, unspecified: Secondary | ICD-10-CM

## 2019-01-22 DIAGNOSIS — T451X5D Adverse effect of antineoplastic and immunosuppressive drugs, subsequent encounter: Secondary | ICD-10-CM

## 2019-01-22 DIAGNOSIS — Z483 Aftercare following surgery for neoplasm: Principal | ICD-10-CM

## 2019-01-22 DIAGNOSIS — F17210 Nicotine dependence, cigarettes, uncomplicated: Secondary | ICD-10-CM

## 2019-01-22 DIAGNOSIS — F419 Anxiety disorder, unspecified: Secondary | ICD-10-CM

## 2019-01-22 DIAGNOSIS — C7931 Secondary malignant neoplasm of brain: Secondary | ICD-10-CM

## 2019-01-22 DIAGNOSIS — Z6821 Body mass index (BMI) 21.0-21.9, adult: Secondary | ICD-10-CM

## 2019-01-22 DIAGNOSIS — F329 Major depressive disorder, single episode, unspecified: Secondary | ICD-10-CM

## 2019-01-22 DIAGNOSIS — K529 Noninfective gastroenteritis and colitis, unspecified: Secondary | ICD-10-CM

## 2019-01-22 DIAGNOSIS — J309 Allergic rhinitis, unspecified: Secondary | ICD-10-CM

## 2019-01-23 DIAGNOSIS — G4700 Insomnia, unspecified: Secondary | ICD-10-CM

## 2019-01-23 DIAGNOSIS — C7931 Secondary malignant neoplasm of brain: Secondary | ICD-10-CM

## 2019-01-23 DIAGNOSIS — G620 Drug-induced polyneuropathy: Secondary | ICD-10-CM

## 2019-01-23 DIAGNOSIS — R627 Adult failure to thrive: Secondary | ICD-10-CM

## 2019-01-23 DIAGNOSIS — F17210 Nicotine dependence, cigarettes, uncomplicated: Secondary | ICD-10-CM

## 2019-01-23 DIAGNOSIS — Z9181 History of falling: Secondary | ICD-10-CM

## 2019-01-23 DIAGNOSIS — I10 Essential (primary) hypertension: Secondary | ICD-10-CM

## 2019-01-23 DIAGNOSIS — R1310 Dysphagia, unspecified: Secondary | ICD-10-CM

## 2019-01-23 DIAGNOSIS — Z6821 Body mass index (BMI) 21.0-21.9, adult: Secondary | ICD-10-CM

## 2019-01-23 DIAGNOSIS — T451X5D Adverse effect of antineoplastic and immunosuppressive drugs, subsequent encounter: Secondary | ICD-10-CM

## 2019-01-23 DIAGNOSIS — F329 Major depressive disorder, single episode, unspecified: Secondary | ICD-10-CM

## 2019-01-23 DIAGNOSIS — J309 Allergic rhinitis, unspecified: Secondary | ICD-10-CM

## 2019-01-23 DIAGNOSIS — Z483 Aftercare following surgery for neoplasm: Principal | ICD-10-CM

## 2019-01-23 DIAGNOSIS — J449 Chronic obstructive pulmonary disease, unspecified: Secondary | ICD-10-CM

## 2019-01-23 DIAGNOSIS — K529 Noninfective gastroenteritis and colitis, unspecified: Secondary | ICD-10-CM

## 2019-01-23 DIAGNOSIS — F419 Anxiety disorder, unspecified: Secondary | ICD-10-CM

## 2019-01-23 DIAGNOSIS — F1020 Alcohol dependence, uncomplicated: Secondary | ICD-10-CM

## 2019-01-23 MED ORDER — ANASTROZOLE 1 MG TABLET
ORAL_TABLET | Freq: Every day | ORAL | 0 refills | 0.00000 days | Status: CP
Start: 2019-01-23 — End: 2019-03-08

## 2019-01-24 ENCOUNTER — Emergency Department: Admit: 2019-01-24 | Discharge: 2019-01-24 | Disposition: A | Discharge status: Home / Self Care | Payer: MEDICARE

## 2019-01-24 ENCOUNTER — Ambulatory Visit: Admit: 2019-01-24 | Discharge: 2019-01-24 | Disposition: A | Discharge status: Home / Self Care | Payer: MEDICARE

## 2019-01-24 DIAGNOSIS — F1020 Alcohol dependence, uncomplicated: Secondary | ICD-10-CM

## 2019-01-24 DIAGNOSIS — J449 Chronic obstructive pulmonary disease, unspecified: Secondary | ICD-10-CM

## 2019-01-24 DIAGNOSIS — Z9181 History of falling: Secondary | ICD-10-CM

## 2019-01-24 DIAGNOSIS — R1310 Dysphagia, unspecified: Secondary | ICD-10-CM

## 2019-01-24 DIAGNOSIS — G4700 Insomnia, unspecified: Secondary | ICD-10-CM

## 2019-01-24 DIAGNOSIS — I10 Essential (primary) hypertension: Secondary | ICD-10-CM

## 2019-01-24 DIAGNOSIS — M79604 Pain in right leg: Principal | ICD-10-CM

## 2019-01-24 DIAGNOSIS — J309 Allergic rhinitis, unspecified: Secondary | ICD-10-CM

## 2019-01-24 DIAGNOSIS — Z6821 Body mass index (BMI) 21.0-21.9, adult: Secondary | ICD-10-CM

## 2019-01-24 DIAGNOSIS — F419 Anxiety disorder, unspecified: Secondary | ICD-10-CM

## 2019-01-24 DIAGNOSIS — F329 Major depressive disorder, single episode, unspecified: Secondary | ICD-10-CM

## 2019-01-24 DIAGNOSIS — K529 Noninfective gastroenteritis and colitis, unspecified: Secondary | ICD-10-CM

## 2019-01-24 DIAGNOSIS — R627 Adult failure to thrive: Secondary | ICD-10-CM

## 2019-01-24 DIAGNOSIS — G620 Drug-induced polyneuropathy: Secondary | ICD-10-CM

## 2019-01-24 DIAGNOSIS — T451X5D Adverse effect of antineoplastic and immunosuppressive drugs, subsequent encounter: Secondary | ICD-10-CM

## 2019-01-24 DIAGNOSIS — Z483 Aftercare following surgery for neoplasm: Principal | ICD-10-CM

## 2019-01-24 DIAGNOSIS — C7931 Secondary malignant neoplasm of brain: Secondary | ICD-10-CM

## 2019-01-24 DIAGNOSIS — F17210 Nicotine dependence, cigarettes, uncomplicated: Secondary | ICD-10-CM

## 2019-01-24 MED ORDER — HYDROCHLOROTHIAZIDE 25 MG TABLET
ORAL_TABLET | Freq: Every day | ORAL | 0 refills | 0.00000 days | Status: CP
Start: 2019-01-24 — End: 2019-02-18

## 2019-01-25 ENCOUNTER — Ambulatory Visit: Admit: 2019-01-25 | Discharge: 2019-01-26 | Payer: MEDICARE

## 2019-01-25 DIAGNOSIS — F329 Major depressive disorder, single episode, unspecified: Secondary | ICD-10-CM

## 2019-01-25 DIAGNOSIS — K529 Noninfective gastroenteritis and colitis, unspecified: Secondary | ICD-10-CM

## 2019-01-25 DIAGNOSIS — C7931 Secondary malignant neoplasm of brain: Secondary | ICD-10-CM

## 2019-01-25 DIAGNOSIS — Z6821 Body mass index (BMI) 21.0-21.9, adult: Secondary | ICD-10-CM

## 2019-01-25 DIAGNOSIS — T451X5D Adverse effect of antineoplastic and immunosuppressive drugs, subsequent encounter: Secondary | ICD-10-CM

## 2019-01-25 DIAGNOSIS — R600 Localized edema: Secondary | ICD-10-CM

## 2019-01-25 DIAGNOSIS — F17210 Nicotine dependence, cigarettes, uncomplicated: Secondary | ICD-10-CM

## 2019-01-25 DIAGNOSIS — Z0289 Encounter for other administrative examinations: Secondary | ICD-10-CM

## 2019-01-25 DIAGNOSIS — G4700 Insomnia, unspecified: Secondary | ICD-10-CM

## 2019-01-25 DIAGNOSIS — R627 Adult failure to thrive: Secondary | ICD-10-CM

## 2019-01-25 DIAGNOSIS — R1310 Dysphagia, unspecified: Secondary | ICD-10-CM

## 2019-01-25 DIAGNOSIS — J449 Chronic obstructive pulmonary disease, unspecified: Secondary | ICD-10-CM

## 2019-01-25 DIAGNOSIS — J309 Allergic rhinitis, unspecified: Secondary | ICD-10-CM

## 2019-01-25 DIAGNOSIS — Z483 Aftercare following surgery for neoplasm: Principal | ICD-10-CM

## 2019-01-25 DIAGNOSIS — Z9181 History of falling: Secondary | ICD-10-CM

## 2019-01-25 DIAGNOSIS — G620 Drug-induced polyneuropathy: Secondary | ICD-10-CM

## 2019-01-25 DIAGNOSIS — F1020 Alcohol dependence, uncomplicated: Secondary | ICD-10-CM

## 2019-01-25 DIAGNOSIS — I10 Essential (primary) hypertension: Secondary | ICD-10-CM

## 2019-01-25 DIAGNOSIS — F419 Anxiety disorder, unspecified: Secondary | ICD-10-CM

## 2019-01-25 DIAGNOSIS — C50919 Malignant neoplasm of unspecified site of unspecified female breast: Principal | ICD-10-CM

## 2019-01-28 ENCOUNTER — Ambulatory Visit: Admit: 2019-01-28 | Discharge: 2019-01-29 | Payer: MEDICARE

## 2019-01-28 ENCOUNTER — Ambulatory Visit: Admit: 2019-01-28 | Discharge: 2019-02-23 | Payer: MEDICARE

## 2019-01-28 ENCOUNTER — Other Ambulatory Visit: Admit: 2019-01-28 | Discharge: 2019-01-29 | Payer: MEDICARE

## 2019-01-28 ENCOUNTER — Ambulatory Visit
Admit: 2019-01-28 | Discharge: 2019-02-23 | Payer: MEDICARE | Attending: Radiation Oncology | Primary: Radiation Oncology

## 2019-01-28 ENCOUNTER — Ambulatory Visit
Admit: 2019-01-28 | Discharge: 2019-01-29 | Payer: MEDICARE | Attending: Geriatric Medicine | Primary: Geriatric Medicine

## 2019-01-28 DIAGNOSIS — C7931 Secondary malignant neoplasm of brain: Principal | ICD-10-CM

## 2019-01-28 DIAGNOSIS — Z5111 Encounter for antineoplastic chemotherapy: Secondary | ICD-10-CM

## 2019-01-28 DIAGNOSIS — C50919 Malignant neoplasm of unspecified site of unspecified female breast: Secondary | ICD-10-CM

## 2019-01-28 DIAGNOSIS — T451X5A Adverse effect of antineoplastic and immunosuppressive drugs, initial encounter: Secondary | ICD-10-CM

## 2019-01-28 DIAGNOSIS — R51 Headache: Secondary | ICD-10-CM

## 2019-01-28 DIAGNOSIS — Z170 Estrogen receptor positive status [ER+]: Secondary | ICD-10-CM

## 2019-01-28 DIAGNOSIS — G629 Polyneuropathy, unspecified: Secondary | ICD-10-CM

## 2019-01-28 DIAGNOSIS — G620 Drug-induced polyneuropathy: Secondary | ICD-10-CM

## 2019-01-28 DIAGNOSIS — F329 Major depressive disorder, single episode, unspecified: Secondary | ICD-10-CM

## 2019-01-28 DIAGNOSIS — R110 Nausea: Principal | ICD-10-CM

## 2019-01-28 DIAGNOSIS — Z515 Encounter for palliative care: Secondary | ICD-10-CM

## 2019-01-28 DIAGNOSIS — C50811 Malignant neoplasm of overlapping sites of right female breast: Secondary | ICD-10-CM

## 2019-01-28 DIAGNOSIS — F1190 Opioid use, unspecified, uncomplicated: Secondary | ICD-10-CM

## 2019-01-28 DIAGNOSIS — F5101 Primary insomnia: Secondary | ICD-10-CM

## 2019-01-28 DIAGNOSIS — G939 Disorder of brain, unspecified: Secondary | ICD-10-CM

## 2019-01-28 MED ORDER — HYDROMORPHONE 2 MG TABLET
ORAL_TABLET | ORAL | 0 refills | 0.00000 days | Status: CP
Start: 2019-01-28 — End: 2019-04-23

## 2019-01-28 MED ORDER — PREGABALIN 25 MG CAPSULE
ORAL_CAPSULE | 2 refills | 0 days | Status: CP
Start: 2019-01-28 — End: 2019-02-12

## 2019-01-29 DIAGNOSIS — G620 Drug-induced polyneuropathy: Secondary | ICD-10-CM

## 2019-01-29 DIAGNOSIS — F329 Major depressive disorder, single episode, unspecified: Secondary | ICD-10-CM

## 2019-01-29 DIAGNOSIS — C7931 Secondary malignant neoplasm of brain: Secondary | ICD-10-CM

## 2019-01-29 DIAGNOSIS — R1310 Dysphagia, unspecified: Secondary | ICD-10-CM

## 2019-01-29 DIAGNOSIS — J309 Allergic rhinitis, unspecified: Secondary | ICD-10-CM

## 2019-01-29 DIAGNOSIS — G4700 Insomnia, unspecified: Secondary | ICD-10-CM

## 2019-01-29 DIAGNOSIS — R627 Adult failure to thrive: Secondary | ICD-10-CM

## 2019-01-29 DIAGNOSIS — F1020 Alcohol dependence, uncomplicated: Secondary | ICD-10-CM

## 2019-01-29 DIAGNOSIS — Z9181 History of falling: Secondary | ICD-10-CM

## 2019-01-29 DIAGNOSIS — T451X5D Adverse effect of antineoplastic and immunosuppressive drugs, subsequent encounter: Secondary | ICD-10-CM

## 2019-01-29 DIAGNOSIS — K529 Noninfective gastroenteritis and colitis, unspecified: Secondary | ICD-10-CM

## 2019-01-29 DIAGNOSIS — F419 Anxiety disorder, unspecified: Secondary | ICD-10-CM

## 2019-01-29 DIAGNOSIS — J449 Chronic obstructive pulmonary disease, unspecified: Secondary | ICD-10-CM

## 2019-01-29 DIAGNOSIS — Z483 Aftercare following surgery for neoplasm: Principal | ICD-10-CM

## 2019-01-29 DIAGNOSIS — F17210 Nicotine dependence, cigarettes, uncomplicated: Secondary | ICD-10-CM

## 2019-01-29 DIAGNOSIS — Z6821 Body mass index (BMI) 21.0-21.9, adult: Secondary | ICD-10-CM

## 2019-01-29 DIAGNOSIS — I10 Essential (primary) hypertension: Secondary | ICD-10-CM

## 2019-01-29 MED ORDER — NALOXONE 4 MG/ACTUATION NASAL SPRAY
NASAL | 0 refills | 0.00000 days | Status: CP
Start: 2019-01-29 — End: ?

## 2019-01-30 DIAGNOSIS — Z483 Aftercare following surgery for neoplasm: Principal | ICD-10-CM

## 2019-01-30 DIAGNOSIS — R1310 Dysphagia, unspecified: Secondary | ICD-10-CM

## 2019-01-30 DIAGNOSIS — F17210 Nicotine dependence, cigarettes, uncomplicated: Secondary | ICD-10-CM

## 2019-01-30 DIAGNOSIS — C7931 Secondary malignant neoplasm of brain: Secondary | ICD-10-CM

## 2019-01-30 DIAGNOSIS — J309 Allergic rhinitis, unspecified: Secondary | ICD-10-CM

## 2019-01-30 DIAGNOSIS — I10 Essential (primary) hypertension: Secondary | ICD-10-CM

## 2019-01-30 DIAGNOSIS — Z6821 Body mass index (BMI) 21.0-21.9, adult: Secondary | ICD-10-CM

## 2019-01-30 DIAGNOSIS — F1020 Alcohol dependence, uncomplicated: Secondary | ICD-10-CM

## 2019-01-30 DIAGNOSIS — F329 Major depressive disorder, single episode, unspecified: Secondary | ICD-10-CM

## 2019-01-30 DIAGNOSIS — G620 Drug-induced polyneuropathy: Secondary | ICD-10-CM

## 2019-01-30 DIAGNOSIS — F419 Anxiety disorder, unspecified: Secondary | ICD-10-CM

## 2019-01-30 DIAGNOSIS — R627 Adult failure to thrive: Secondary | ICD-10-CM

## 2019-01-30 DIAGNOSIS — T451X5D Adverse effect of antineoplastic and immunosuppressive drugs, subsequent encounter: Secondary | ICD-10-CM

## 2019-01-30 DIAGNOSIS — K529 Noninfective gastroenteritis and colitis, unspecified: Secondary | ICD-10-CM

## 2019-01-30 DIAGNOSIS — G4700 Insomnia, unspecified: Secondary | ICD-10-CM

## 2019-01-30 DIAGNOSIS — J449 Chronic obstructive pulmonary disease, unspecified: Secondary | ICD-10-CM

## 2019-01-30 DIAGNOSIS — Z9181 History of falling: Secondary | ICD-10-CM

## 2019-01-31 MED ORDER — CLONAZEPAM 1 MG TABLET
ORAL_TABLET | Freq: Every evening | ORAL | 0 refills | 0 days | Status: CP | PRN
Start: 2019-01-31 — End: 2019-03-11

## 2019-02-01 DIAGNOSIS — C7931 Secondary malignant neoplasm of brain: Secondary | ICD-10-CM

## 2019-02-01 DIAGNOSIS — J449 Chronic obstructive pulmonary disease, unspecified: Secondary | ICD-10-CM

## 2019-02-01 DIAGNOSIS — R1310 Dysphagia, unspecified: Secondary | ICD-10-CM

## 2019-02-01 DIAGNOSIS — Z483 Aftercare following surgery for neoplasm: Principal | ICD-10-CM

## 2019-02-01 DIAGNOSIS — F1020 Alcohol dependence, uncomplicated: Secondary | ICD-10-CM

## 2019-02-01 DIAGNOSIS — Z9181 History of falling: Secondary | ICD-10-CM

## 2019-02-01 DIAGNOSIS — G4700 Insomnia, unspecified: Secondary | ICD-10-CM

## 2019-02-01 DIAGNOSIS — Z6821 Body mass index (BMI) 21.0-21.9, adult: Secondary | ICD-10-CM

## 2019-02-01 DIAGNOSIS — J309 Allergic rhinitis, unspecified: Secondary | ICD-10-CM

## 2019-02-01 DIAGNOSIS — F329 Major depressive disorder, single episode, unspecified: Secondary | ICD-10-CM

## 2019-02-01 DIAGNOSIS — F17210 Nicotine dependence, cigarettes, uncomplicated: Secondary | ICD-10-CM

## 2019-02-01 DIAGNOSIS — G620 Drug-induced polyneuropathy: Secondary | ICD-10-CM

## 2019-02-01 DIAGNOSIS — F419 Anxiety disorder, unspecified: Secondary | ICD-10-CM

## 2019-02-01 DIAGNOSIS — T451X5D Adverse effect of antineoplastic and immunosuppressive drugs, subsequent encounter: Secondary | ICD-10-CM

## 2019-02-01 DIAGNOSIS — I10 Essential (primary) hypertension: Secondary | ICD-10-CM

## 2019-02-01 DIAGNOSIS — R627 Adult failure to thrive: Secondary | ICD-10-CM

## 2019-02-01 DIAGNOSIS — K529 Noninfective gastroenteritis and colitis, unspecified: Secondary | ICD-10-CM

## 2019-02-04 DIAGNOSIS — R627 Adult failure to thrive: Secondary | ICD-10-CM

## 2019-02-04 DIAGNOSIS — G620 Drug-induced polyneuropathy: Secondary | ICD-10-CM

## 2019-02-04 DIAGNOSIS — K529 Noninfective gastroenteritis and colitis, unspecified: Secondary | ICD-10-CM

## 2019-02-04 DIAGNOSIS — G4700 Insomnia, unspecified: Secondary | ICD-10-CM

## 2019-02-04 DIAGNOSIS — T451X5D Adverse effect of antineoplastic and immunosuppressive drugs, subsequent encounter: Secondary | ICD-10-CM

## 2019-02-04 DIAGNOSIS — F419 Anxiety disorder, unspecified: Secondary | ICD-10-CM

## 2019-02-04 DIAGNOSIS — R1310 Dysphagia, unspecified: Secondary | ICD-10-CM

## 2019-02-04 DIAGNOSIS — Z483 Aftercare following surgery for neoplasm: Principal | ICD-10-CM

## 2019-02-04 DIAGNOSIS — Z9181 History of falling: Secondary | ICD-10-CM

## 2019-02-04 DIAGNOSIS — J309 Allergic rhinitis, unspecified: Secondary | ICD-10-CM

## 2019-02-04 DIAGNOSIS — F1020 Alcohol dependence, uncomplicated: Secondary | ICD-10-CM

## 2019-02-04 DIAGNOSIS — J449 Chronic obstructive pulmonary disease, unspecified: Secondary | ICD-10-CM

## 2019-02-04 DIAGNOSIS — Z6821 Body mass index (BMI) 21.0-21.9, adult: Secondary | ICD-10-CM

## 2019-02-04 DIAGNOSIS — F329 Major depressive disorder, single episode, unspecified: Secondary | ICD-10-CM

## 2019-02-04 DIAGNOSIS — I10 Essential (primary) hypertension: Secondary | ICD-10-CM

## 2019-02-04 DIAGNOSIS — F17210 Nicotine dependence, cigarettes, uncomplicated: Secondary | ICD-10-CM

## 2019-02-04 DIAGNOSIS — C7931 Secondary malignant neoplasm of brain: Secondary | ICD-10-CM

## 2019-02-04 MED ORDER — INDOMETHACIN 25 MG CAPSULE
ORAL_CAPSULE | 0 refills | 0 days | Status: CP
Start: 2019-02-04 — End: 2019-02-25

## 2019-02-04 MED ORDER — ESOMEPRAZOLE MAGNESIUM 40 MG CAPSULE,DELAYED RELEASE
ORAL_CAPSULE | Freq: Every day | ORAL | 0 refills | 0 days | Status: CP
Start: 2019-02-04 — End: 2019-02-25

## 2019-02-05 DIAGNOSIS — R1310 Dysphagia, unspecified: Secondary | ICD-10-CM

## 2019-02-05 DIAGNOSIS — F17210 Nicotine dependence, cigarettes, uncomplicated: Secondary | ICD-10-CM

## 2019-02-05 DIAGNOSIS — F1020 Alcohol dependence, uncomplicated: Secondary | ICD-10-CM

## 2019-02-05 DIAGNOSIS — F329 Major depressive disorder, single episode, unspecified: Secondary | ICD-10-CM

## 2019-02-05 DIAGNOSIS — F419 Anxiety disorder, unspecified: Secondary | ICD-10-CM

## 2019-02-05 DIAGNOSIS — Z483 Aftercare following surgery for neoplasm: Principal | ICD-10-CM

## 2019-02-05 DIAGNOSIS — R627 Adult failure to thrive: Secondary | ICD-10-CM

## 2019-02-05 DIAGNOSIS — J309 Allergic rhinitis, unspecified: Secondary | ICD-10-CM

## 2019-02-05 DIAGNOSIS — T451X5D Adverse effect of antineoplastic and immunosuppressive drugs, subsequent encounter: Secondary | ICD-10-CM

## 2019-02-05 DIAGNOSIS — I10 Essential (primary) hypertension: Secondary | ICD-10-CM

## 2019-02-05 DIAGNOSIS — C7931 Secondary malignant neoplasm of brain: Secondary | ICD-10-CM

## 2019-02-05 DIAGNOSIS — J449 Chronic obstructive pulmonary disease, unspecified: Secondary | ICD-10-CM

## 2019-02-05 DIAGNOSIS — Z6821 Body mass index (BMI) 21.0-21.9, adult: Secondary | ICD-10-CM

## 2019-02-05 DIAGNOSIS — G4700 Insomnia, unspecified: Secondary | ICD-10-CM

## 2019-02-05 DIAGNOSIS — K529 Noninfective gastroenteritis and colitis, unspecified: Secondary | ICD-10-CM

## 2019-02-05 DIAGNOSIS — G620 Drug-induced polyneuropathy: Secondary | ICD-10-CM

## 2019-02-05 DIAGNOSIS — Z9181 History of falling: Secondary | ICD-10-CM

## 2019-02-06 DIAGNOSIS — C7931 Secondary malignant neoplasm of brain: Principal | ICD-10-CM

## 2019-02-07 DIAGNOSIS — R1310 Dysphagia, unspecified: Secondary | ICD-10-CM

## 2019-02-07 DIAGNOSIS — J309 Allergic rhinitis, unspecified: Secondary | ICD-10-CM

## 2019-02-07 DIAGNOSIS — F419 Anxiety disorder, unspecified: Secondary | ICD-10-CM

## 2019-02-07 DIAGNOSIS — F17210 Nicotine dependence, cigarettes, uncomplicated: Secondary | ICD-10-CM

## 2019-02-07 DIAGNOSIS — J449 Chronic obstructive pulmonary disease, unspecified: Secondary | ICD-10-CM

## 2019-02-07 DIAGNOSIS — C7931 Secondary malignant neoplasm of brain: Secondary | ICD-10-CM

## 2019-02-07 DIAGNOSIS — F329 Major depressive disorder, single episode, unspecified: Secondary | ICD-10-CM

## 2019-02-07 DIAGNOSIS — I10 Essential (primary) hypertension: Secondary | ICD-10-CM

## 2019-02-07 DIAGNOSIS — Z6821 Body mass index (BMI) 21.0-21.9, adult: Secondary | ICD-10-CM

## 2019-02-07 DIAGNOSIS — Z483 Aftercare following surgery for neoplasm: Principal | ICD-10-CM

## 2019-02-07 DIAGNOSIS — Z9181 History of falling: Secondary | ICD-10-CM

## 2019-02-07 DIAGNOSIS — G4700 Insomnia, unspecified: Secondary | ICD-10-CM

## 2019-02-07 DIAGNOSIS — F1020 Alcohol dependence, uncomplicated: Secondary | ICD-10-CM

## 2019-02-07 DIAGNOSIS — G620 Drug-induced polyneuropathy: Secondary | ICD-10-CM

## 2019-02-07 DIAGNOSIS — R627 Adult failure to thrive: Secondary | ICD-10-CM

## 2019-02-07 DIAGNOSIS — K529 Noninfective gastroenteritis and colitis, unspecified: Secondary | ICD-10-CM

## 2019-02-07 DIAGNOSIS — T451X5D Adverse effect of antineoplastic and immunosuppressive drugs, subsequent encounter: Secondary | ICD-10-CM

## 2019-02-08 DIAGNOSIS — J449 Chronic obstructive pulmonary disease, unspecified: Secondary | ICD-10-CM

## 2019-02-08 DIAGNOSIS — R627 Adult failure to thrive: Secondary | ICD-10-CM

## 2019-02-08 DIAGNOSIS — C7931 Secondary malignant neoplasm of brain: Secondary | ICD-10-CM

## 2019-02-08 DIAGNOSIS — I10 Essential (primary) hypertension: Secondary | ICD-10-CM

## 2019-02-08 DIAGNOSIS — F419 Anxiety disorder, unspecified: Secondary | ICD-10-CM

## 2019-02-08 DIAGNOSIS — F1020 Alcohol dependence, uncomplicated: Secondary | ICD-10-CM

## 2019-02-08 DIAGNOSIS — T451X5D Adverse effect of antineoplastic and immunosuppressive drugs, subsequent encounter: Secondary | ICD-10-CM

## 2019-02-08 DIAGNOSIS — Z6821 Body mass index (BMI) 21.0-21.9, adult: Secondary | ICD-10-CM

## 2019-02-08 DIAGNOSIS — K529 Noninfective gastroenteritis and colitis, unspecified: Secondary | ICD-10-CM

## 2019-02-08 DIAGNOSIS — G620 Drug-induced polyneuropathy: Secondary | ICD-10-CM

## 2019-02-08 DIAGNOSIS — J309 Allergic rhinitis, unspecified: Secondary | ICD-10-CM

## 2019-02-08 DIAGNOSIS — F17210 Nicotine dependence, cigarettes, uncomplicated: Secondary | ICD-10-CM

## 2019-02-08 DIAGNOSIS — G4700 Insomnia, unspecified: Secondary | ICD-10-CM

## 2019-02-08 DIAGNOSIS — R1310 Dysphagia, unspecified: Secondary | ICD-10-CM

## 2019-02-08 DIAGNOSIS — F329 Major depressive disorder, single episode, unspecified: Secondary | ICD-10-CM

## 2019-02-08 DIAGNOSIS — Z9181 History of falling: Secondary | ICD-10-CM

## 2019-02-08 DIAGNOSIS — Z483 Aftercare following surgery for neoplasm: Principal | ICD-10-CM

## 2019-02-11 DIAGNOSIS — C50919 Malignant neoplasm of unspecified site of unspecified female breast: Principal | ICD-10-CM

## 2019-02-11 DIAGNOSIS — C50811 Malignant neoplasm of overlapping sites of right female breast: Principal | ICD-10-CM

## 2019-02-12 DIAGNOSIS — I10 Essential (primary) hypertension: Secondary | ICD-10-CM

## 2019-02-12 DIAGNOSIS — T451X5D Adverse effect of antineoplastic and immunosuppressive drugs, subsequent encounter: Secondary | ICD-10-CM

## 2019-02-12 DIAGNOSIS — G620 Drug-induced polyneuropathy: Secondary | ICD-10-CM

## 2019-02-12 DIAGNOSIS — J309 Allergic rhinitis, unspecified: Secondary | ICD-10-CM

## 2019-02-12 DIAGNOSIS — Z483 Aftercare following surgery for neoplasm: Principal | ICD-10-CM

## 2019-02-12 DIAGNOSIS — Z9181 History of falling: Secondary | ICD-10-CM

## 2019-02-12 DIAGNOSIS — G4700 Insomnia, unspecified: Secondary | ICD-10-CM

## 2019-02-12 DIAGNOSIS — F419 Anxiety disorder, unspecified: Secondary | ICD-10-CM

## 2019-02-12 DIAGNOSIS — J449 Chronic obstructive pulmonary disease, unspecified: Secondary | ICD-10-CM

## 2019-02-12 DIAGNOSIS — F17210 Nicotine dependence, cigarettes, uncomplicated: Secondary | ICD-10-CM

## 2019-02-12 DIAGNOSIS — R627 Adult failure to thrive: Secondary | ICD-10-CM

## 2019-02-12 DIAGNOSIS — Z6821 Body mass index (BMI) 21.0-21.9, adult: Secondary | ICD-10-CM

## 2019-02-12 DIAGNOSIS — R1310 Dysphagia, unspecified: Secondary | ICD-10-CM

## 2019-02-12 DIAGNOSIS — K529 Noninfective gastroenteritis and colitis, unspecified: Secondary | ICD-10-CM

## 2019-02-12 DIAGNOSIS — F1020 Alcohol dependence, uncomplicated: Secondary | ICD-10-CM

## 2019-02-12 DIAGNOSIS — F329 Major depressive disorder, single episode, unspecified: Secondary | ICD-10-CM

## 2019-02-12 DIAGNOSIS — C7931 Secondary malignant neoplasm of brain: Secondary | ICD-10-CM

## 2019-02-12 MED ORDER — PREGABALIN 75 MG CAPSULE
ORAL_CAPSULE | Freq: Three times a day (TID) | ORAL | 2 refills | 0.00000 days | Status: CP
Start: 2019-02-12 — End: 2019-03-04

## 2019-02-12 MED ORDER — HYDROCODONE 5 MG-ACETAMINOPHEN 325 MG TABLET
ORAL_TABLET | Freq: Three times a day (TID) | ORAL | 0 refills | 0 days | Status: CP | PRN
Start: 2019-02-12 — End: 2019-02-18

## 2019-02-14 DIAGNOSIS — F17210 Nicotine dependence, cigarettes, uncomplicated: Secondary | ICD-10-CM

## 2019-02-14 DIAGNOSIS — I10 Essential (primary) hypertension: Secondary | ICD-10-CM

## 2019-02-14 DIAGNOSIS — Z483 Aftercare following surgery for neoplasm: Principal | ICD-10-CM

## 2019-02-14 DIAGNOSIS — F419 Anxiety disorder, unspecified: Secondary | ICD-10-CM

## 2019-02-14 DIAGNOSIS — C7931 Secondary malignant neoplasm of brain: Secondary | ICD-10-CM

## 2019-02-14 DIAGNOSIS — J449 Chronic obstructive pulmonary disease, unspecified: Secondary | ICD-10-CM

## 2019-02-14 DIAGNOSIS — T451X5D Adverse effect of antineoplastic and immunosuppressive drugs, subsequent encounter: Secondary | ICD-10-CM

## 2019-02-14 DIAGNOSIS — R627 Adult failure to thrive: Secondary | ICD-10-CM

## 2019-02-14 DIAGNOSIS — G620 Drug-induced polyneuropathy: Secondary | ICD-10-CM

## 2019-02-14 DIAGNOSIS — K529 Noninfective gastroenteritis and colitis, unspecified: Secondary | ICD-10-CM

## 2019-02-14 DIAGNOSIS — Z6821 Body mass index (BMI) 21.0-21.9, adult: Secondary | ICD-10-CM

## 2019-02-14 DIAGNOSIS — F329 Major depressive disorder, single episode, unspecified: Secondary | ICD-10-CM

## 2019-02-14 DIAGNOSIS — F1020 Alcohol dependence, uncomplicated: Secondary | ICD-10-CM

## 2019-02-14 DIAGNOSIS — G4700 Insomnia, unspecified: Secondary | ICD-10-CM

## 2019-02-14 DIAGNOSIS — Z9181 History of falling: Secondary | ICD-10-CM

## 2019-02-14 DIAGNOSIS — R1310 Dysphagia, unspecified: Secondary | ICD-10-CM

## 2019-02-14 DIAGNOSIS — J309 Allergic rhinitis, unspecified: Secondary | ICD-10-CM

## 2019-02-14 MED ORDER — DULOXETINE 30 MG CAPSULE,DELAYED RELEASE
ORAL_CAPSULE | 0 refills | 0 days | Status: CP
Start: 2019-02-14 — End: 2019-03-14

## 2019-02-14 MED ORDER — WALKER
0 refills | 0 days | Status: CP
Start: 2019-02-14 — End: 2020-02-15

## 2019-02-18 MED ORDER — HYDROCHLOROTHIAZIDE 25 MG TABLET
ORAL_TABLET | Freq: Every day | ORAL | 1 refills | 0.00000 days | Status: CP
Start: 2019-02-18 — End: 2019-07-16

## 2019-02-18 MED ORDER — HYDROCODONE 5 MG-ACETAMINOPHEN 325 MG TABLET
ORAL_TABLET | Freq: Three times a day (TID) | ORAL | 0 refills | 0 days | Status: CP | PRN
Start: 2019-02-18 — End: 2019-05-21

## 2019-02-21 ENCOUNTER — Ambulatory Visit: Admit: 2019-02-21 | Discharge: 2019-02-21 | Payer: MEDICARE

## 2019-02-21 ENCOUNTER — Ambulatory Visit
Admit: 2019-02-21 | Discharge: 2019-02-21 | Payer: MEDICARE | Attending: Student in an Organized Health Care Education/Training Program | Primary: Student in an Organized Health Care Education/Training Program

## 2019-02-21 DIAGNOSIS — T451X5A Adverse effect of antineoplastic and immunosuppressive drugs, initial encounter: Secondary | ICD-10-CM

## 2019-02-21 DIAGNOSIS — G620 Drug-induced polyneuropathy: Principal | ICD-10-CM

## 2019-02-21 DIAGNOSIS — C50811 Malignant neoplasm of overlapping sites of right female breast: Secondary | ICD-10-CM

## 2019-02-21 DIAGNOSIS — R110 Nausea: Principal | ICD-10-CM

## 2019-02-22 DIAGNOSIS — Z170 Estrogen receptor positive status [ER+]: Secondary | ICD-10-CM

## 2019-02-22 DIAGNOSIS — C7931 Secondary malignant neoplasm of brain: Principal | ICD-10-CM

## 2019-02-22 DIAGNOSIS — C50811 Malignant neoplasm of overlapping sites of right female breast: Principal | ICD-10-CM

## 2019-02-25 ENCOUNTER — Ambulatory Visit
Admit: 2019-02-25 | Discharge: 2019-03-26 | Payer: MEDICARE | Attending: Radiation Oncology | Primary: Radiation Oncology

## 2019-02-25 ENCOUNTER — Ambulatory Visit: Admit: 2019-02-25 | Discharge: 2019-03-26 | Payer: MEDICARE | Attending: Internal Medicine | Primary: Internal Medicine

## 2019-02-25 ENCOUNTER — Ambulatory Visit: Admit: 2019-02-25 | Discharge: 2019-03-26 | Payer: MEDICARE

## 2019-02-25 ENCOUNTER — Ambulatory Visit
Admit: 2019-02-25 | Discharge: 2019-03-26 | Payer: MEDICARE | Attending: Geriatric Medicine | Primary: Geriatric Medicine

## 2019-02-25 DIAGNOSIS — R51 Headache: Principal | ICD-10-CM

## 2019-02-25 DIAGNOSIS — Z170 Estrogen receptor positive status [ER+]: Principal | ICD-10-CM

## 2019-02-25 DIAGNOSIS — C50919 Malignant neoplasm of unspecified site of unspecified female breast: Principal | ICD-10-CM

## 2019-02-25 DIAGNOSIS — Z5111 Encounter for antineoplastic chemotherapy: Principal | ICD-10-CM

## 2019-02-25 DIAGNOSIS — T451X5A Adverse effect of antineoplastic and immunosuppressive drugs, initial encounter: Principal | ICD-10-CM

## 2019-02-25 DIAGNOSIS — Z515 Encounter for palliative care: Principal | ICD-10-CM

## 2019-02-25 DIAGNOSIS — G620 Drug-induced polyneuropathy: Principal | ICD-10-CM

## 2019-02-25 DIAGNOSIS — R61 Generalized hyperhidrosis: Principal | ICD-10-CM

## 2019-02-25 DIAGNOSIS — H5310 Unspecified subjective visual disturbances: Principal | ICD-10-CM

## 2019-02-25 DIAGNOSIS — C7931 Secondary malignant neoplasm of brain: Principal | ICD-10-CM

## 2019-02-25 DIAGNOSIS — C50811 Malignant neoplasm of overlapping sites of right female breast: Principal | ICD-10-CM

## 2019-02-25 DIAGNOSIS — R252 Cramp and spasm: Principal | ICD-10-CM

## 2019-02-25 DIAGNOSIS — K219 Gastro-esophageal reflux disease without esophagitis: Principal | ICD-10-CM

## 2019-02-25 DIAGNOSIS — M2550 Pain in unspecified joint: Principal | ICD-10-CM

## 2019-02-25 MED ORDER — INDOMETHACIN 25 MG CAPSULE
ORAL_CAPSULE | 1 refills | 0 days | Status: CP
Start: 2019-02-25 — End: 2019-04-23

## 2019-02-25 MED ORDER — ESOMEPRAZOLE MAGNESIUM 40 MG CAPSULE,DELAYED RELEASE
ORAL_CAPSULE | Freq: Every day | ORAL | 3 refills | 0 days | Status: CP
Start: 2019-02-25 — End: 2019-03-01

## 2019-02-26 DIAGNOSIS — C50811 Malignant neoplasm of overlapping sites of right female breast: Principal | ICD-10-CM

## 2019-02-26 DIAGNOSIS — C7931 Secondary malignant neoplasm of brain: Principal | ICD-10-CM

## 2019-02-26 DIAGNOSIS — Z170 Estrogen receptor positive status [ER+]: Principal | ICD-10-CM

## 2019-02-27 DIAGNOSIS — C7931 Secondary malignant neoplasm of brain: Principal | ICD-10-CM

## 2019-02-27 DIAGNOSIS — C50811 Malignant neoplasm of overlapping sites of right female breast: Principal | ICD-10-CM

## 2019-02-27 DIAGNOSIS — Z170 Estrogen receptor positive status [ER+]: Principal | ICD-10-CM

## 2019-02-27 DIAGNOSIS — C50919 Malignant neoplasm of unspecified site of unspecified female breast: Principal | ICD-10-CM

## 2019-02-27 DIAGNOSIS — R51 Headache: Principal | ICD-10-CM

## 2019-02-28 DIAGNOSIS — Z170 Estrogen receptor positive status [ER+]: Principal | ICD-10-CM

## 2019-02-28 DIAGNOSIS — C7931 Secondary malignant neoplasm of brain: Principal | ICD-10-CM

## 2019-02-28 DIAGNOSIS — C50919 Malignant neoplasm of unspecified site of unspecified female breast: Principal | ICD-10-CM

## 2019-02-28 DIAGNOSIS — C50811 Malignant neoplasm of overlapping sites of right female breast: Principal | ICD-10-CM

## 2019-03-01 ENCOUNTER — Ambulatory Visit: Admit: 2019-03-01 | Discharge: 2019-03-14 | Payer: MEDICARE

## 2019-03-01 DIAGNOSIS — E278 Other specified disorders of adrenal gland: Principal | ICD-10-CM

## 2019-03-01 DIAGNOSIS — Z170 Estrogen receptor positive status [ER+]: Principal | ICD-10-CM

## 2019-03-01 DIAGNOSIS — K219 Gastro-esophageal reflux disease without esophagitis: Principal | ICD-10-CM

## 2019-03-01 DIAGNOSIS — M8448XD Pathological fracture, other site, subsequent encounter for fracture with routine healing: Principal | ICD-10-CM

## 2019-03-01 DIAGNOSIS — M84454D Pathological fracture, pelvis, subsequent encounter for fracture with routine healing: Principal | ICD-10-CM

## 2019-03-01 DIAGNOSIS — C7951 Secondary malignant neoplasm of bone: Principal | ICD-10-CM

## 2019-03-01 DIAGNOSIS — C50811 Malignant neoplasm of overlapping sites of right female breast: Principal | ICD-10-CM

## 2019-03-01 DIAGNOSIS — G620 Drug-induced polyneuropathy: Principal | ICD-10-CM

## 2019-03-04 ENCOUNTER — Ambulatory Visit
Admit: 2019-03-04 | Discharge: 2019-03-05 | Payer: MEDICARE | Attending: Student in an Organized Health Care Education/Training Program | Primary: Student in an Organized Health Care Education/Training Program

## 2019-03-04 ENCOUNTER — Ambulatory Visit: Admit: 2019-03-04 | Discharge: 2019-03-05 | Payer: MEDICARE | Attending: Adult Health | Primary: Adult Health

## 2019-03-04 ENCOUNTER — Other Ambulatory Visit: Admit: 2019-03-04 | Discharge: 2019-03-05 | Payer: MEDICARE

## 2019-03-04 DIAGNOSIS — R110 Nausea: Principal | ICD-10-CM

## 2019-03-04 DIAGNOSIS — R1310 Dysphagia, unspecified: Principal | ICD-10-CM

## 2019-03-04 DIAGNOSIS — C50919 Malignant neoplasm of unspecified site of unspecified female breast: Principal | ICD-10-CM

## 2019-03-04 DIAGNOSIS — G44209 Tension-type headache, unspecified, not intractable: Principal | ICD-10-CM

## 2019-03-04 DIAGNOSIS — J309 Allergic rhinitis, unspecified: Principal | ICD-10-CM

## 2019-03-04 DIAGNOSIS — J449 Chronic obstructive pulmonary disease, unspecified: Principal | ICD-10-CM

## 2019-03-04 DIAGNOSIS — C50811 Malignant neoplasm of overlapping sites of right female breast: Principal | ICD-10-CM

## 2019-03-04 DIAGNOSIS — I10 Essential (primary) hypertension: Principal | ICD-10-CM

## 2019-03-04 DIAGNOSIS — R799 Abnormal finding of blood chemistry, unspecified: Principal | ICD-10-CM

## 2019-03-04 DIAGNOSIS — F329 Major depressive disorder, single episode, unspecified: Principal | ICD-10-CM

## 2019-03-04 DIAGNOSIS — E785 Hyperlipidemia, unspecified: Principal | ICD-10-CM

## 2019-03-04 DIAGNOSIS — G43009 Migraine without aura, not intractable, without status migrainosus: Principal | ICD-10-CM

## 2019-03-04 DIAGNOSIS — Z006 Encounter for examination for normal comparison and control in clinical research program: Principal | ICD-10-CM

## 2019-03-04 DIAGNOSIS — G629 Polyneuropathy, unspecified: Principal | ICD-10-CM

## 2019-03-04 DIAGNOSIS — C7931 Secondary malignant neoplasm of brain: Principal | ICD-10-CM

## 2019-03-04 DIAGNOSIS — N399 Disorder of urinary system, unspecified: Principal | ICD-10-CM

## 2019-03-04 DIAGNOSIS — F17200 Nicotine dependence, unspecified, uncomplicated: Principal | ICD-10-CM

## 2019-03-04 DIAGNOSIS — S060X9A Concussion with loss of consciousness of unspecified duration, initial encounter: Principal | ICD-10-CM

## 2019-03-04 DIAGNOSIS — F1020 Alcohol dependence, uncomplicated: Principal | ICD-10-CM

## 2019-03-04 DIAGNOSIS — F419 Anxiety disorder, unspecified: Principal | ICD-10-CM

## 2019-03-04 DIAGNOSIS — K219 Gastro-esophageal reflux disease without esophagitis: Principal | ICD-10-CM

## 2019-03-04 DIAGNOSIS — R51 Headache: Principal | ICD-10-CM

## 2019-03-04 DIAGNOSIS — D649 Anemia, unspecified: Principal | ICD-10-CM

## 2019-03-04 DIAGNOSIS — G4700 Insomnia, unspecified: Principal | ICD-10-CM

## 2019-03-04 MED ORDER — PREGABALIN 100 MG CAPSULE: 100 mg | capsule | Freq: Three times a day (TID) | 2 refills | 0 days | Status: AC

## 2019-03-04 MED ORDER — DEXAMETHASONE 4 MG TABLET
ORAL_TABLET | 0 refills | 0 days | Status: CP
Start: 2019-03-04 — End: 2019-04-23

## 2019-03-04 MED ORDER — BENZONATATE 100 MG CAPSULE
ORAL_CAPSULE | Freq: Three times a day (TID) | ORAL | 0 refills | 0 days | Status: CP | PRN
Start: 2019-03-04 — End: 2019-04-23

## 2019-03-04 MED ORDER — PREGABALIN 100 MG CAPSULE
ORAL_CAPSULE | Freq: Three times a day (TID) | ORAL | 2 refills | 0.00000 days | Status: CP
Start: 2019-03-04 — End: 2019-03-04

## 2019-03-04 MED ORDER — TOPIRAMATE 25 MG TABLET
ORAL_TABLET | 5 refills | 0 days | Status: CP
Start: 2019-03-04 — End: 2019-05-09

## 2019-03-05 ENCOUNTER — Ambulatory Visit: Admit: 2019-03-05 | Discharge: 2019-03-06 | Payer: MEDICARE

## 2019-03-05 DIAGNOSIS — C50919 Malignant neoplasm of unspecified site of unspecified female breast: Principal | ICD-10-CM

## 2019-03-05 DIAGNOSIS — R51 Headache: Principal | ICD-10-CM

## 2019-03-06 ENCOUNTER — Ambulatory Visit: Admit: 2019-03-06 | Discharge: 2019-03-07 | Payer: MEDICARE

## 2019-03-06 DIAGNOSIS — C50919 Malignant neoplasm of unspecified site of unspecified female breast: Principal | ICD-10-CM

## 2019-03-08 DIAGNOSIS — C7931 Secondary malignant neoplasm of brain: Principal | ICD-10-CM

## 2019-03-08 DIAGNOSIS — Z170 Estrogen receptor positive status [ER+]: Principal | ICD-10-CM

## 2019-03-08 DIAGNOSIS — C50811 Malignant neoplasm of overlapping sites of right female breast: Principal | ICD-10-CM

## 2019-03-08 MED ORDER — EXEMESTANE 25 MG TABLET
ORAL_TABLET | Freq: Every day | ORAL | 11 refills | 0.00000 days | Status: CP
Start: 2019-03-08 — End: 2019-05-13

## 2019-03-11 DIAGNOSIS — F419 Anxiety disorder, unspecified: Principal | ICD-10-CM

## 2019-03-11 MED ORDER — CLONAZEPAM 1 MG TABLET
ORAL_TABLET | Freq: Every evening | ORAL | 0 refills | 0 days | Status: CP | PRN
Start: 2019-03-11 — End: 2019-04-04

## 2019-03-12 MED ORDER — POTASSIUM CHLORIDE ER 10 MEQ TABLET,EXTENDED RELEASE(PART/CRYST)
ORAL_TABLET | Freq: Every day | ORAL | 0 refills | 0 days | Status: CP
Start: 2019-03-12 — End: 2019-05-13

## 2019-03-13 ENCOUNTER — Ambulatory Visit: Admit: 2019-03-13 | Discharge: 2019-03-14 | Payer: MEDICARE

## 2019-03-13 DIAGNOSIS — C50811 Malignant neoplasm of overlapping sites of right female breast: Principal | ICD-10-CM

## 2019-03-13 DIAGNOSIS — Z5112 Encounter for antineoplastic immunotherapy: Principal | ICD-10-CM

## 2019-03-13 DIAGNOSIS — R110 Nausea: Principal | ICD-10-CM

## 2019-03-14 DIAGNOSIS — F329 Major depressive disorder, single episode, unspecified: Principal | ICD-10-CM

## 2019-03-14 MED ORDER — VENLAFAXINE ER 75 MG CAPSULE,EXTENDED RELEASE 24 HR
ORAL_CAPSULE | 0 refills | 0 days | Status: CP
Start: 2019-03-14 — End: 2019-04-04

## 2019-03-19 DIAGNOSIS — R197 Diarrhea, unspecified: Principal | ICD-10-CM

## 2019-03-19 MED ORDER — DIPHENOXYLATE-ATROPINE 2.5 MG-0.025 MG TABLET
ORAL_TABLET | Freq: Four times a day (QID) | ORAL | 0 refills | 0 days | Status: CP | PRN
Start: 2019-03-19 — End: 2019-04-26

## 2019-03-20 MED ORDER — ESOMEPRAZOLE MAGNESIUM 40 MG CAPSULE,DELAYED RELEASE
ORAL_CAPSULE | Freq: Every day | ORAL | 3 refills | 0 days | Status: CP
Start: 2019-03-20 — End: ?

## 2019-03-22 MED ORDER — PREGABALIN 150 MG CAPSULE
ORAL_CAPSULE | Freq: Three times a day (TID) | ORAL | 1 refills | 0.00000 days
Start: 2019-03-22 — End: 2019-04-04

## 2019-04-04 ENCOUNTER — Ambulatory Visit: Admit: 2019-04-04 | Discharge: 2019-04-05 | Payer: MEDICARE

## 2019-04-04 ENCOUNTER — Telehealth: Admit: 2019-04-04 | Discharge: 2019-04-05 | Payer: MEDICARE | Attending: Psychiatry | Primary: Psychiatry

## 2019-04-04 DIAGNOSIS — F419 Anxiety disorder, unspecified: Secondary | ICD-10-CM

## 2019-04-04 DIAGNOSIS — F329 Major depressive disorder, single episode, unspecified: Principal | ICD-10-CM

## 2019-04-04 DIAGNOSIS — R110 Nausea: Principal | ICD-10-CM

## 2019-04-04 DIAGNOSIS — C50811 Malignant neoplasm of overlapping sites of right female breast: Secondary | ICD-10-CM

## 2019-04-04 MED ORDER — PREGABALIN 200 MG CAPSULE
ORAL_CAPSULE | Freq: Three times a day (TID) | ORAL | 2 refills | 0 days
Start: 2019-04-04 — End: 2019-08-19

## 2019-04-04 MED ORDER — VENLAFAXINE ER 150 MG CAPSULE,EXTENDED RELEASE 24 HR
ORAL_CAPSULE | Freq: Every day | ORAL | 0 refills | 0 days | Status: CP
Start: 2019-04-04 — End: 2019-05-09

## 2019-04-04 MED ORDER — CLONAZEPAM 1 MG TABLET
ORAL_TABLET | Freq: Every evening | ORAL | 0 refills | 0.00000 days | Status: CP | PRN
Start: 2019-04-04 — End: 2019-05-09

## 2019-04-11 ENCOUNTER — Institutional Professional Consult (permissible substitution): Admit: 2019-04-11 | Discharge: 2019-04-12 | Payer: MEDICARE

## 2019-04-15 ENCOUNTER — Institutional Professional Consult (permissible substitution)
Admit: 2019-04-15 | Discharge: 2019-04-15 | Payer: MEDICARE | Attending: Student in an Organized Health Care Education/Training Program | Primary: Student in an Organized Health Care Education/Training Program

## 2019-04-15 ENCOUNTER — Other Ambulatory Visit: Admit: 2019-04-15 | Discharge: 2019-04-15 | Payer: MEDICARE

## 2019-04-15 ENCOUNTER — Ambulatory Visit: Admit: 2019-04-15 | Discharge: 2019-04-15 | Payer: MEDICARE

## 2019-04-15 DIAGNOSIS — C50919 Malignant neoplasm of unspecified site of unspecified female breast: Principal | ICD-10-CM

## 2019-04-15 DIAGNOSIS — C7931 Secondary malignant neoplasm of brain: Principal | ICD-10-CM

## 2019-04-15 DIAGNOSIS — G43009 Migraine without aura, not intractable, without status migrainosus: Secondary | ICD-10-CM

## 2019-04-15 MED ORDER — PROPRANOLOL 40 MG TABLET
ORAL_TABLET | Freq: Two times a day (BID) | ORAL | 3 refills | 0 days | Status: CP
Start: 2019-04-15 — End: 2019-06-10

## 2019-04-23 ENCOUNTER — Institutional Professional Consult (permissible substitution): Admit: 2019-04-23 | Discharge: 2019-04-24 | Payer: MEDICARE

## 2019-04-23 DIAGNOSIS — K529 Noninfective gastroenteritis and colitis, unspecified: Secondary | ICD-10-CM

## 2019-04-23 DIAGNOSIS — F5101 Primary insomnia: Secondary | ICD-10-CM

## 2019-04-23 DIAGNOSIS — G620 Drug-induced polyneuropathy: Secondary | ICD-10-CM

## 2019-04-23 DIAGNOSIS — G43009 Migraine without aura, not intractable, without status migrainosus: Principal | ICD-10-CM

## 2019-04-23 DIAGNOSIS — F329 Major depressive disorder, single episode, unspecified: Secondary | ICD-10-CM

## 2019-04-23 DIAGNOSIS — C7931 Secondary malignant neoplasm of brain: Secondary | ICD-10-CM

## 2019-04-23 DIAGNOSIS — I10 Essential (primary) hypertension: Secondary | ICD-10-CM

## 2019-04-23 DIAGNOSIS — C50919 Malignant neoplasm of unspecified site of unspecified female breast: Secondary | ICD-10-CM

## 2019-04-25 ENCOUNTER — Ambulatory Visit: Admit: 2019-04-25 | Discharge: 2019-04-26 | Payer: MEDICARE

## 2019-04-25 DIAGNOSIS — C50811 Malignant neoplasm of overlapping sites of right female breast: Secondary | ICD-10-CM

## 2019-04-25 DIAGNOSIS — R110 Nausea: Principal | ICD-10-CM

## 2019-04-26 MED ORDER — DIPHENOXYLATE-ATROPINE 2.5 MG-0.025 MG TABLET
ORAL_TABLET | Freq: Four times a day (QID) | ORAL | 0 refills | 0 days | Status: CP | PRN
Start: 2019-04-26 — End: ?

## 2019-05-09 ENCOUNTER — Institutional Professional Consult (permissible substitution): Admit: 2019-05-09 | Discharge: 2019-05-10 | Payer: MEDICARE | Attending: Psychiatry | Primary: Psychiatry

## 2019-05-09 DIAGNOSIS — F329 Major depressive disorder, single episode, unspecified: Secondary | ICD-10-CM

## 2019-05-09 DIAGNOSIS — F5101 Primary insomnia: Secondary | ICD-10-CM

## 2019-05-09 DIAGNOSIS — F419 Anxiety disorder, unspecified: Principal | ICD-10-CM

## 2019-05-09 MED ORDER — TRAZODONE 50 MG TABLET
ORAL_TABLET | Freq: Every evening | ORAL | 3 refills | 0 days | Status: CP
Start: 2019-05-09 — End: 2019-08-15

## 2019-05-09 MED ORDER — VENLAFAXINE ER 75 MG CAPSULE,EXTENDED RELEASE 24 HR
ORAL_CAPSULE | 0 refills | 0 days | Status: CP
Start: 2019-05-09 — End: 2019-06-12

## 2019-05-09 MED ORDER — CLONAZEPAM 1 MG TABLET
ORAL_TABLET | Freq: Every evening | ORAL | 0 refills | 0 days | Status: CP | PRN
Start: 2019-05-09 — End: 2019-06-13

## 2019-05-09 MED ORDER — VENLAFAXINE ER 150 MG CAPSULE,EXTENDED RELEASE 24 HR
ORAL_CAPSULE | 0 refills | 0 days | Status: CP
Start: 2019-05-09 — End: 2019-06-12

## 2019-05-13 MED ORDER — POTASSIUM CHLORIDE ER 10 MEQ TABLET,EXTENDED RELEASE(PART/CRYST)
ORAL_TABLET | Freq: Every day | ORAL | 0 refills | 0 days | Status: CP
Start: 2019-05-13 — End: 2019-07-02

## 2019-05-13 MED ORDER — TAMOXIFEN 20 MG TABLET
ORAL_TABLET | Freq: Every day | ORAL | 11 refills | 0.00000 days | Status: CP
Start: 2019-05-13 — End: ?

## 2019-05-16 ENCOUNTER — Ambulatory Visit: Admit: 2019-05-16 | Discharge: 2019-05-17 | Payer: MEDICARE

## 2019-05-16 DIAGNOSIS — C50811 Malignant neoplasm of overlapping sites of right female breast: Secondary | ICD-10-CM

## 2019-05-16 DIAGNOSIS — R110 Nausea: Principal | ICD-10-CM

## 2019-05-21 ENCOUNTER — Institutional Professional Consult (permissible substitution): Admit: 2019-05-21 | Discharge: 2019-05-22 | Payer: MEDICARE

## 2019-05-21 DIAGNOSIS — R5383 Other fatigue: Secondary | ICD-10-CM

## 2019-05-21 DIAGNOSIS — G893 Neoplasm related pain (acute) (chronic): Principal | ICD-10-CM

## 2019-05-21 MED ORDER — HYDROCODONE 5 MG-ACETAMINOPHEN 325 MG TABLET
ORAL_TABLET | 0 refills | 0 days | Status: CP
Start: 2019-05-21 — End: ?

## 2019-06-06 ENCOUNTER — Ambulatory Visit: Admit: 2019-06-06 | Discharge: 2019-06-07 | Payer: MEDICARE

## 2019-06-06 DIAGNOSIS — C50811 Malignant neoplasm of overlapping sites of right female breast: Secondary | ICD-10-CM

## 2019-06-06 DIAGNOSIS — R110 Nausea: Principal | ICD-10-CM

## 2019-06-07 ENCOUNTER — Ambulatory Visit: Admit: 2019-06-07 | Discharge: 2019-06-25 | Payer: MEDICARE

## 2019-06-07 ENCOUNTER — Ambulatory Visit
Admit: 2019-06-07 | Discharge: 2019-06-25 | Payer: MEDICARE | Attending: Radiation Oncology | Primary: Radiation Oncology

## 2019-06-07 ENCOUNTER — Ambulatory Visit
Admit: 2019-06-07 | Discharge: 2019-06-25 | Payer: MEDICARE | Attending: Orthopaedic Surgery | Primary: Orthopaedic Surgery

## 2019-06-07 DIAGNOSIS — C50919 Malignant neoplasm of unspecified site of unspecified female breast: Principal | ICD-10-CM

## 2019-06-07 DIAGNOSIS — C7931 Secondary malignant neoplasm of brain: Principal | ICD-10-CM

## 2019-06-07 DIAGNOSIS — M6530 Trigger finger, unspecified finger: Principal | ICD-10-CM

## 2019-06-10 ENCOUNTER — Institutional Professional Consult (permissible substitution)
Admit: 2019-06-10 | Discharge: 2019-06-11 | Payer: MEDICARE | Attending: Student in an Organized Health Care Education/Training Program | Primary: Student in an Organized Health Care Education/Training Program

## 2019-06-10 DIAGNOSIS — C7931 Secondary malignant neoplasm of brain: Principal | ICD-10-CM

## 2019-06-10 DIAGNOSIS — G43009 Migraine without aura, not intractable, without status migrainosus: Secondary | ICD-10-CM

## 2019-06-10 MED ORDER — PROPRANOLOL 40 MG TABLET
ORAL_TABLET | Freq: Two times a day (BID) | ORAL | 3 refills | 0 days
Start: 2019-06-10 — End: 2019-07-11

## 2019-06-12 MED ORDER — VENLAFAXINE ER 75 MG CAPSULE,EXTENDED RELEASE 24 HR
ORAL_CAPSULE | 2 refills | 0 days | Status: CP
Start: 2019-06-12 — End: 2019-08-15

## 2019-06-12 MED ORDER — VENLAFAXINE ER 150 MG CAPSULE,EXTENDED RELEASE 24 HR
ORAL_CAPSULE | 2 refills | 0 days | Status: CP
Start: 2019-06-12 — End: 2019-08-15

## 2019-06-13 ENCOUNTER — Institutional Professional Consult (permissible substitution): Admit: 2019-06-13 | Discharge: 2019-06-14 | Payer: MEDICARE | Attending: Psychiatry | Primary: Psychiatry

## 2019-06-13 DIAGNOSIS — F419 Anxiety disorder, unspecified: Principal | ICD-10-CM

## 2019-06-13 MED ORDER — CLONAZEPAM 1 MG TABLET
ORAL_TABLET | Freq: Every evening | ORAL | 0 refills | 0 days | Status: CP | PRN
Start: 2019-06-13 — End: 2019-07-11

## 2019-06-15 ENCOUNTER — Ambulatory Visit: Admit: 2019-06-15 | Discharge: 2019-06-16 | Payer: MEDICARE

## 2019-06-15 DIAGNOSIS — Z1159 Encounter for screening for other viral diseases: Principal | ICD-10-CM

## 2019-06-16 DIAGNOSIS — M65342 Trigger finger, left ring finger: Principal | ICD-10-CM

## 2019-06-17 ENCOUNTER — Encounter: Admit: 2019-06-17 | Discharge: 2019-06-17 | Payer: MEDICARE

## 2019-06-17 ENCOUNTER — Ambulatory Visit: Admit: 2019-06-17 | Discharge: 2019-06-17 | Payer: MEDICARE

## 2019-06-17 ENCOUNTER — Institutional Professional Consult (permissible substitution)
Admit: 2019-06-17 | Discharge: 2019-06-18 | Payer: MEDICARE | Attending: Geriatric Medicine | Primary: Geriatric Medicine

## 2019-06-17 DIAGNOSIS — C50811 Malignant neoplasm of overlapping sites of right female breast: Secondary | ICD-10-CM

## 2019-06-17 DIAGNOSIS — F329 Major depressive disorder, single episode, unspecified: Secondary | ICD-10-CM

## 2019-06-17 DIAGNOSIS — Z170 Estrogen receptor positive status [ER+]: Secondary | ICD-10-CM

## 2019-06-17 DIAGNOSIS — C50919 Malignant neoplasm of unspecified site of unspecified female breast: Principal | ICD-10-CM

## 2019-06-17 DIAGNOSIS — M65342 Trigger finger, left ring finger: Principal | ICD-10-CM

## 2019-06-17 DIAGNOSIS — Z5111 Encounter for antineoplastic chemotherapy: Secondary | ICD-10-CM

## 2019-06-17 DIAGNOSIS — M2550 Pain in unspecified joint: Secondary | ICD-10-CM

## 2019-06-17 DIAGNOSIS — R51 Headache: Secondary | ICD-10-CM

## 2019-06-17 MED ORDER — TRAMADOL 50 MG TABLET
ORAL_TABLET | 0 refills | 0 days | Status: CP
Start: 2019-06-17 — End: 2019-08-19
  Filled 2019-06-17: qty 6, 2d supply, fill #0

## 2019-06-17 MED FILL — TRAMADOL 50 MG TABLET: 2 days supply | Qty: 6 | Fill #0 | Status: AC

## 2019-06-21 ENCOUNTER — Institutional Professional Consult (permissible substitution): Admit: 2019-06-21 | Discharge: 2019-06-22 | Payer: MEDICARE

## 2019-06-21 DIAGNOSIS — Z720 Tobacco use: Secondary | ICD-10-CM

## 2019-06-21 DIAGNOSIS — R5383 Other fatigue: Principal | ICD-10-CM

## 2019-06-21 MED ORDER — NICOTINE (POLACRILEX) 2 MG BUCCAL MINI LOZENGE
1 refills | 0 days | Status: CP
Start: 2019-06-21 — End: 2019-08-19

## 2019-06-27 ENCOUNTER — Ambulatory Visit: Admit: 2019-06-27 | Discharge: 2019-07-10 | Payer: MEDICARE

## 2019-06-27 ENCOUNTER — Other Ambulatory Visit: Admit: 2019-06-27 | Discharge: 2019-07-10 | Payer: MEDICARE

## 2019-06-27 ENCOUNTER — Ambulatory Visit: Admit: 2019-06-27 | Discharge: 2019-07-26 | Payer: MEDICARE

## 2019-06-27 DIAGNOSIS — Z170 Estrogen receptor positive status [ER+]: Secondary | ICD-10-CM

## 2019-06-27 DIAGNOSIS — R110 Nausea: Principal | ICD-10-CM

## 2019-06-27 DIAGNOSIS — C50811 Malignant neoplasm of overlapping sites of right female breast: Secondary | ICD-10-CM

## 2019-07-02 ENCOUNTER — Ambulatory Visit: Admit: 2019-07-02 | Discharge: 2019-07-02 | Payer: MEDICARE

## 2019-07-02 ENCOUNTER — Ambulatory Visit
Admit: 2019-07-02 | Discharge: 2019-07-02 | Payer: MEDICARE | Attending: Orthopaedic Surgery | Primary: Orthopaedic Surgery

## 2019-07-02 DIAGNOSIS — M79641 Pain in right hand: Secondary | ICD-10-CM

## 2019-07-02 DIAGNOSIS — Z170 Estrogen receptor positive status [ER+]: Secondary | ICD-10-CM

## 2019-07-02 DIAGNOSIS — M6530 Trigger finger, unspecified finger: Principal | ICD-10-CM

## 2019-07-02 DIAGNOSIS — C50811 Malignant neoplasm of overlapping sites of right female breast: Principal | ICD-10-CM

## 2019-07-02 MED ORDER — POTASSIUM CHLORIDE ER 10 MEQ TABLET,EXTENDED RELEASE(PART/CRYST)
ORAL_TABLET | Freq: Every day | ORAL | 0 refills | 0.00000 days | Status: CP
Start: 2019-07-02 — End: 2019-08-19

## 2019-07-09 ENCOUNTER — Ambulatory Visit
Admit: 2019-07-09 | Discharge: 2019-08-07 | Payer: MEDICARE | Attending: Rehabilitative and Restorative Service Providers" | Primary: Rehabilitative and Restorative Service Providers"

## 2019-07-09 DIAGNOSIS — M79641 Pain in right hand: Principal | ICD-10-CM

## 2019-07-11 ENCOUNTER — Institutional Professional Consult (permissible substitution): Admit: 2019-07-11 | Discharge: 2019-07-12 | Payer: MEDICARE | Attending: Psychiatry | Primary: Psychiatry

## 2019-07-11 DIAGNOSIS — F419 Anxiety disorder, unspecified: Principal | ICD-10-CM

## 2019-07-11 DIAGNOSIS — F1020 Alcohol dependence, uncomplicated: Secondary | ICD-10-CM

## 2019-07-11 DIAGNOSIS — F331 Major depressive disorder, recurrent, moderate: Secondary | ICD-10-CM

## 2019-07-11 MED ORDER — CLONAZEPAM 1 MG TABLET
ORAL_TABLET | Freq: Every evening | ORAL | 0 refills | 30 days | Status: CP | PRN
Start: 2019-07-11 — End: 2019-08-15

## 2019-07-15 ENCOUNTER — Institutional Professional Consult (permissible substitution)
Admit: 2019-07-15 | Discharge: 2019-07-16 | Payer: MEDICARE | Attending: Student in an Organized Health Care Education/Training Program | Primary: Student in an Organized Health Care Education/Training Program

## 2019-07-15 DIAGNOSIS — G43009 Migraine without aura, not intractable, without status migrainosus: Secondary | ICD-10-CM

## 2019-07-15 DIAGNOSIS — C50919 Malignant neoplasm of unspecified site of unspecified female breast: Secondary | ICD-10-CM

## 2019-07-15 DIAGNOSIS — C7931 Secondary malignant neoplasm of brain: Principal | ICD-10-CM

## 2019-07-15 MED ORDER — PROPRANOLOL 40 MG TABLET
ORAL_TABLET | Freq: Two times a day (BID) | ORAL | 3 refills | 40 days | Status: CP
Start: 2019-07-15 — End: 2019-07-31

## 2019-07-16 MED ORDER — HYDROCHLOROTHIAZIDE 25 MG TABLET
ORAL_TABLET | Freq: Every day | ORAL | 1 refills | 90.00000 days | Status: CP
Start: 2019-07-16 — End: 2019-08-13

## 2019-07-18 ENCOUNTER — Ambulatory Visit: Admit: 2019-07-18 | Discharge: 2019-07-19 | Payer: MEDICARE

## 2019-07-18 DIAGNOSIS — R110 Nausea: Principal | ICD-10-CM

## 2019-07-18 DIAGNOSIS — C50811 Malignant neoplasm of overlapping sites of right female breast: Secondary | ICD-10-CM

## 2019-07-31 MED ORDER — LASMIDITAN 50 MG TABLET
ORAL_TABLET | Freq: Once | ORAL | 1 refills | 0 days | Status: CP | PRN
Start: 2019-07-31 — End: ?

## 2019-08-08 ENCOUNTER — Ambulatory Visit: Admit: 2019-08-08 | Discharge: 2019-08-09 | Payer: MEDICARE

## 2019-08-08 DIAGNOSIS — R110 Nausea: Principal | ICD-10-CM

## 2019-08-08 DIAGNOSIS — C50811 Malignant neoplasm of overlapping sites of right female breast: Secondary | ICD-10-CM

## 2019-08-13 ENCOUNTER — Institutional Professional Consult (permissible substitution): Admit: 2019-08-13 | Discharge: 2019-08-14 | Payer: MEDICARE

## 2019-08-13 DIAGNOSIS — G43009 Migraine without aura, not intractable, without status migrainosus: Secondary | ICD-10-CM

## 2019-08-13 DIAGNOSIS — I10 Essential (primary) hypertension: Secondary | ICD-10-CM

## 2019-08-13 DIAGNOSIS — F5101 Primary insomnia: Secondary | ICD-10-CM

## 2019-08-13 DIAGNOSIS — F329 Major depressive disorder, single episode, unspecified: Secondary | ICD-10-CM

## 2019-08-13 DIAGNOSIS — C7931 Secondary malignant neoplasm of brain: Principal | ICD-10-CM

## 2019-08-13 DIAGNOSIS — G620 Drug-induced polyneuropathy: Secondary | ICD-10-CM

## 2019-08-13 DIAGNOSIS — C50919 Malignant neoplasm of unspecified site of unspecified female breast: Secondary | ICD-10-CM

## 2019-08-13 MED ORDER — LISINOPRIL 20 MG-HYDROCHLOROTHIAZIDE 25 MG TABLET
ORAL_TABLET | Freq: Every day | ORAL | 3 refills | 90 days | Status: CP
Start: 2019-08-13 — End: 2020-08-12

## 2019-08-15 ENCOUNTER — Institutional Professional Consult (permissible substitution): Admit: 2019-08-15 | Discharge: 2019-08-16 | Payer: MEDICARE | Attending: Psychiatry | Primary: Psychiatry

## 2019-08-15 DIAGNOSIS — F329 Major depressive disorder, single episode, unspecified: Principal | ICD-10-CM

## 2019-08-15 DIAGNOSIS — F5101 Primary insomnia: Secondary | ICD-10-CM

## 2019-08-15 DIAGNOSIS — F419 Anxiety disorder, unspecified: Secondary | ICD-10-CM

## 2019-08-15 MED ORDER — CLONAZEPAM 1 MG TABLET
ORAL_TABLET | Freq: Every evening | ORAL | 0 refills | 30.00000 days | Status: CP | PRN
Start: 2019-08-15 — End: 2019-09-14

## 2019-08-15 MED ORDER — VENLAFAXINE ER 150 MG CAPSULE,EXTENDED RELEASE 24 HR
ORAL_CAPSULE | 2 refills | 0 days | Status: CP
Start: 2019-08-15 — End: ?

## 2019-08-15 MED ORDER — VENLAFAXINE ER 75 MG CAPSULE,EXTENDED RELEASE 24 HR
ORAL_CAPSULE | 2 refills | 0 days | Status: CP
Start: 2019-08-15 — End: ?

## 2019-08-15 MED ORDER — TRAZODONE 50 MG TABLET
ORAL_TABLET | Freq: Every evening | ORAL | 1 refills | 30 days | Status: CP
Start: 2019-08-15 — End: 2019-12-13

## 2019-08-19 ENCOUNTER — Ambulatory Visit: Admit: 2019-08-19 | Discharge: 2019-08-19 | Payer: MEDICARE

## 2019-08-19 ENCOUNTER — Institutional Professional Consult (permissible substitution)
Admit: 2019-08-19 | Discharge: 2019-08-19 | Payer: MEDICARE | Attending: Student in an Organized Health Care Education/Training Program | Primary: Student in an Organized Health Care Education/Training Program

## 2019-08-19 ENCOUNTER — Ambulatory Visit
Admit: 2019-08-19 | Discharge: 2019-09-06 | Payer: MEDICARE | Attending: Rehabilitative and Restorative Service Providers" | Primary: Rehabilitative and Restorative Service Providers"

## 2019-08-19 ENCOUNTER — Institutional Professional Consult (permissible substitution): Admit: 2019-08-19 | Discharge: 2019-08-19 | Payer: MEDICARE | Attending: Adult Health | Primary: Adult Health

## 2019-08-19 DIAGNOSIS — G43009 Migraine without aura, not intractable, without status migrainosus: Secondary | ICD-10-CM

## 2019-08-19 DIAGNOSIS — M79641 Pain in right hand: Principal | ICD-10-CM

## 2019-08-19 DIAGNOSIS — C50919 Malignant neoplasm of unspecified site of unspecified female breast: Secondary | ICD-10-CM

## 2019-08-19 DIAGNOSIS — C7931 Secondary malignant neoplasm of brain: Principal | ICD-10-CM

## 2019-08-19 MED ORDER — PREGABALIN 200 MG CAPSULE
ORAL_CAPSULE | Freq: Three times a day (TID) | ORAL | 2 refills | 30 days | Status: CP
Start: 2019-08-19 — End: ?

## 2019-08-19 MED ORDER — POTASSIUM CHLORIDE ER 10 MEQ TABLET,EXTENDED RELEASE(PART/CRYST)
ORAL_TABLET | Freq: Every day | ORAL | 0 refills | 30 days | Status: CP
Start: 2019-08-19 — End: ?

## 2019-08-19 MED ORDER — NICOTINE (POLACRILEX) 2 MG GUM
BUCCAL | 2 refills | 10.00000 days | Status: CP | PRN
Start: 2019-08-19 — End: 2019-09-18

## 2019-08-19 MED ORDER — NORTRIPTYLINE 25 MG CAPSULE
ORAL_CAPSULE | Freq: Every evening | ORAL | 5 refills | 30.00000 days | Status: CP
Start: 2019-08-19 — End: 2020-08-18

## 2019-08-23 ENCOUNTER — Telehealth: Admit: 2019-08-23 | Discharge: 2019-08-24 | Payer: MEDICARE

## 2019-08-30 ENCOUNTER — Ambulatory Visit: Admit: 2019-08-30 | Discharge: 2019-08-31 | Payer: MEDICARE

## 2019-08-30 DIAGNOSIS — C50811 Malignant neoplasm of overlapping sites of right female breast: Secondary | ICD-10-CM

## 2019-08-30 DIAGNOSIS — R110 Nausea: Secondary | ICD-10-CM

## 2019-08-30 DIAGNOSIS — Z5112 Encounter for antineoplastic immunotherapy: Secondary | ICD-10-CM

## 2019-08-30 MED ORDER — PREGABALIN 200 MG CAPSULE
ORAL_CAPSULE | 0 refills | 0 days | Status: CP
Start: 2019-08-30 — End: ?

## 2019-09-04 MED ORDER — NICOTINE 21 MG/24 HR DAILY TRANSDERMAL PATCH
MEDICATED_PATCH | TRANSDERMAL | 2 refills | 28.00000 days | Status: CP
Start: 2019-09-04 — End: ?

## 2019-09-12 ENCOUNTER — Institutional Professional Consult (permissible substitution): Admit: 2019-09-12 | Discharge: 2019-09-13 | Payer: MEDICARE | Attending: Psychiatry | Primary: Psychiatry

## 2019-09-12 DIAGNOSIS — F17200 Nicotine dependence, unspecified, uncomplicated: Secondary | ICD-10-CM

## 2019-09-12 DIAGNOSIS — F1011 Alcohol abuse, in remission: Secondary | ICD-10-CM

## 2019-09-12 DIAGNOSIS — F331 Major depressive disorder, recurrent, moderate: Secondary | ICD-10-CM

## 2019-09-12 DIAGNOSIS — F419 Anxiety disorder, unspecified: Secondary | ICD-10-CM

## 2019-09-12 MED ORDER — CLONAZEPAM 1 MG TABLET
ORAL_TABLET | Freq: Every evening | ORAL | 0 refills | 30 days | Status: CP | PRN
Start: 2019-09-12 — End: 2019-10-12

## 2019-09-16 ENCOUNTER — Institutional Professional Consult (permissible substitution)
Admit: 2019-09-16 | Discharge: 2019-09-17 | Payer: MEDICARE | Attending: Student in an Organized Health Care Education/Training Program | Primary: Student in an Organized Health Care Education/Training Program

## 2019-09-16 DIAGNOSIS — C7931 Secondary malignant neoplasm of brain: Secondary | ICD-10-CM

## 2019-09-16 DIAGNOSIS — C50919 Malignant neoplasm of unspecified site of unspecified female breast: Secondary | ICD-10-CM

## 2019-09-19 ENCOUNTER — Ambulatory Visit: Admit: 2019-09-19 | Discharge: 2019-09-19 | Payer: MEDICARE

## 2019-09-19 DIAGNOSIS — R110 Nausea: Secondary | ICD-10-CM

## 2019-09-19 DIAGNOSIS — C7931 Secondary malignant neoplasm of brain: Secondary | ICD-10-CM

## 2019-09-19 DIAGNOSIS — C50911 Malignant neoplasm of unspecified site of right female breast: Secondary | ICD-10-CM

## 2019-09-19 DIAGNOSIS — C50811 Malignant neoplasm of overlapping sites of right female breast: Secondary | ICD-10-CM

## 2019-09-19 DIAGNOSIS — Z79899 Other long term (current) drug therapy: Secondary | ICD-10-CM

## 2019-09-19 DIAGNOSIS — Z5112 Encounter for antineoplastic immunotherapy: Secondary | ICD-10-CM

## 2019-09-19 DIAGNOSIS — N61 Mastitis without abscess: Secondary | ICD-10-CM

## 2019-09-19 MED ORDER — CEPHALEXIN 500 MG CAPSULE
ORAL_CAPSULE | Freq: Four times a day (QID) | ORAL | 0 refills | 10 days | Status: CP
Start: 2019-09-19 — End: 2019-09-29

## 2019-09-19 MED ORDER — FLUCONAZOLE 150 MG TABLET
ORAL_TABLET | Freq: Once | ORAL | 0 refills | 1 days | Status: CP
Start: 2019-09-19 — End: 2019-09-19

## 2019-09-20 ENCOUNTER — Ambulatory Visit
Admit: 2019-09-20 | Discharge: 2019-09-25 | Payer: MEDICARE | Attending: Radiation Oncology | Primary: Radiation Oncology

## 2019-09-20 DIAGNOSIS — C7931 Secondary malignant neoplasm of brain: Secondary | ICD-10-CM

## 2019-09-20 DIAGNOSIS — Z170 Estrogen receptor positive status [ER+]: Secondary | ICD-10-CM

## 2019-09-20 DIAGNOSIS — C50811 Malignant neoplasm of overlapping sites of right female breast: Secondary | ICD-10-CM

## 2019-10-04 ENCOUNTER — Ambulatory Visit: Admit: 2019-10-04 | Discharge: 2019-10-16 | Payer: MEDICARE

## 2019-10-04 DIAGNOSIS — N6310 Unspecified lump in the right breast, unspecified quadrant: Secondary | ICD-10-CM

## 2019-10-04 DIAGNOSIS — C50919 Malignant neoplasm of unspecified site of unspecified female breast: Secondary | ICD-10-CM

## 2019-10-04 DIAGNOSIS — R937 Abnormal findings on diagnostic imaging of other parts of musculoskeletal system: Secondary | ICD-10-CM

## 2019-10-04 DIAGNOSIS — R110 Nausea: Secondary | ICD-10-CM

## 2019-10-04 DIAGNOSIS — R918 Other nonspecific abnormal finding of lung field: Secondary | ICD-10-CM

## 2019-10-04 DIAGNOSIS — F329 Major depressive disorder, single episode, unspecified: Secondary | ICD-10-CM

## 2019-10-04 DIAGNOSIS — R9389 Abnormal findings on diagnostic imaging of other specified body structures: Secondary | ICD-10-CM

## 2019-10-04 DIAGNOSIS — Z5111 Encounter for antineoplastic chemotherapy: Secondary | ICD-10-CM

## 2019-10-04 DIAGNOSIS — E279 Disorder of adrenal gland, unspecified: Secondary | ICD-10-CM

## 2019-10-04 DIAGNOSIS — C7931 Secondary malignant neoplasm of brain: Secondary | ICD-10-CM

## 2019-10-04 DIAGNOSIS — Z515 Encounter for palliative care: Secondary | ICD-10-CM

## 2019-10-04 DIAGNOSIS — Z170 Estrogen receptor positive status [ER+]: Secondary | ICD-10-CM

## 2019-10-04 DIAGNOSIS — M79601 Pain in right arm: Secondary | ICD-10-CM

## 2019-10-04 DIAGNOSIS — C50811 Malignant neoplasm of overlapping sites of right female breast: Secondary | ICD-10-CM

## 2019-10-07 MED ORDER — DICLOFENAC 1 % TOPICAL GEL: 2 g | g | Freq: Four times a day (QID) | 2 refills | 13 days | Status: AC

## 2019-10-10 ENCOUNTER — Ambulatory Visit: Admit: 2019-10-10 | Discharge: 2019-10-10 | Payer: MEDICARE

## 2019-10-10 ENCOUNTER — Institutional Professional Consult (permissible substitution)
Admit: 2019-10-10 | Discharge: 2019-10-10 | Payer: MEDICARE | Attending: Geriatric Medicine | Primary: Geriatric Medicine

## 2019-10-10 DIAGNOSIS — R110 Nausea: Principal | ICD-10-CM

## 2019-10-10 DIAGNOSIS — Z170 Estrogen receptor positive status [ER+]: Secondary | ICD-10-CM

## 2019-10-10 DIAGNOSIS — C50811 Malignant neoplasm of overlapping sites of right female breast: Principal | ICD-10-CM

## 2019-10-10 MED ORDER — POTASSIUM CHLORIDE ER 10 MEQ TABLET,EXTENDED RELEASE(PART/CRYST): 20 meq | tablet | Freq: Every day | 0 refills | 30 days | Status: AC

## 2019-10-24 ENCOUNTER — Telehealth: Admit: 2019-10-24 | Discharge: 2019-10-25 | Payer: MEDICARE | Attending: Psychiatry | Primary: Psychiatry

## 2019-10-24 MED ORDER — PREGABALIN 200 MG CAPSULE
ORAL_CAPSULE | 0 refills | 0 days | Status: CP
Start: 2019-10-24 — End: ?

## 2019-10-25 MED ORDER — VENLAFAXINE ER 150 MG CAPSULE,EXTENDED RELEASE 24 HR
ORAL_CAPSULE | 2 refills | 0 days | Status: CP
Start: 2019-10-25 — End: ?

## 2019-10-25 MED ORDER — CLONAZEPAM 1 MG TABLET
ORAL_TABLET | Freq: Every evening | ORAL | 1 refills | 30.00000 days | Status: CP | PRN
Start: 2019-10-25 — End: 2019-11-24

## 2019-10-25 MED ORDER — VENLAFAXINE ER 75 MG CAPSULE,EXTENDED RELEASE 24 HR
ORAL_CAPSULE | 2 refills | 0 days | Status: CP
Start: 2019-10-25 — End: ?

## 2019-10-28 DIAGNOSIS — F329 Major depressive disorder, single episode, unspecified: Principal | ICD-10-CM

## 2019-10-28 MED ORDER — VENLAFAXINE ER 150 MG CAPSULE,EXTENDED RELEASE 24 HR
ORAL_CAPSULE | 2 refills | 0 days | Status: CP
Start: 2019-10-28 — End: ?

## 2019-11-08 ENCOUNTER — Ambulatory Visit: Admit: 2019-11-08 | Discharge: 2019-11-08 | Payer: MEDICARE

## 2019-11-08 ENCOUNTER — Ambulatory Visit
Admit: 2019-11-08 | Discharge: 2019-11-25 | Payer: MEDICARE | Attending: Radiation Oncology | Primary: Radiation Oncology

## 2019-11-11 ENCOUNTER — Ambulatory Visit: Admit: 2019-11-11 | Discharge: 2019-11-12 | Payer: MEDICARE

## 2019-11-11 ENCOUNTER — Institutional Professional Consult (permissible substitution)
Admit: 2019-11-11 | Discharge: 2019-11-12 | Payer: MEDICARE | Attending: Student in an Organized Health Care Education/Training Program | Primary: Student in an Organized Health Care Education/Training Program

## 2019-11-11 DIAGNOSIS — C7931 Secondary malignant neoplasm of brain: Principal | ICD-10-CM

## 2019-11-11 DIAGNOSIS — I639 Cerebral infarction, unspecified: Principal | ICD-10-CM

## 2019-11-11 DIAGNOSIS — Z720 Tobacco use: Principal | ICD-10-CM

## 2019-11-11 DIAGNOSIS — I6389 Other cerebral infarction: Principal | ICD-10-CM

## 2019-11-11 MED ORDER — NICOTINE 21 MG/24 HR DAILY TRANSDERMAL PATCH
MEDICATED_PATCH | TRANSDERMAL | 2 refills | 28 days | Status: CP
Start: 2019-11-11 — End: ?

## 2019-11-11 MED ORDER — ASPIRIN 81 MG TABLET,DELAYED RELEASE
ORAL_TABLET | Freq: Every day | ORAL | 2 refills | 150.00000 days | Status: CP
Start: 2019-11-11 — End: 2020-11-10

## 2019-11-15 ENCOUNTER — Institutional Professional Consult (permissible substitution): Admit: 2019-11-15 | Discharge: 2019-11-16 | Payer: MEDICARE

## 2019-11-15 ENCOUNTER — Ambulatory Visit: Admit: 2019-11-15 | Discharge: 2019-11-16 | Payer: MEDICARE

## 2019-11-15 DIAGNOSIS — M542 Cervicalgia: Principal | ICD-10-CM

## 2019-11-15 DIAGNOSIS — C7931 Secondary malignant neoplasm of brain: Principal | ICD-10-CM

## 2019-11-15 DIAGNOSIS — Z720 Tobacco use: Principal | ICD-10-CM

## 2019-11-15 DIAGNOSIS — M25519 Pain in unspecified shoulder: Principal | ICD-10-CM

## 2019-11-15 DIAGNOSIS — C50811 Malignant neoplasm of overlapping sites of right female breast: Principal | ICD-10-CM

## 2019-11-15 DIAGNOSIS — I639 Cerebral infarction, unspecified: Principal | ICD-10-CM

## 2019-11-15 DIAGNOSIS — Z515 Encounter for palliative care: Principal | ICD-10-CM

## 2019-11-15 DIAGNOSIS — Z170 Estrogen receptor positive status [ER+]: Principal | ICD-10-CM

## 2019-11-15 DIAGNOSIS — C50919 Malignant neoplasm of unspecified site of unspecified female breast: Principal | ICD-10-CM

## 2019-11-15 MED ORDER — CYCLOBENZAPRINE 10 MG TABLET
ORAL_TABLET | 0 refills | 0 days | Status: CP
Start: 2019-11-15 — End: ?

## 2019-11-15 MED ORDER — IBUPROFEN 400 MG TABLET
ORAL_TABLET | Freq: Four times a day (QID) | ORAL | 1 refills | 8 days | Status: CP | PRN
Start: 2019-11-15 — End: ?

## 2019-11-16 DIAGNOSIS — I639 Cerebral infarction, unspecified: Principal | ICD-10-CM

## 2019-11-16 DIAGNOSIS — E7800 Pure hypercholesterolemia, unspecified: Principal | ICD-10-CM

## 2019-11-16 MED ORDER — ATORVASTATIN 40 MG TABLET
ORAL_TABLET | Freq: Every day | ORAL | 3 refills | 90 days | Status: CP
Start: 2019-11-16 — End: 2020-11-15

## 2019-11-20 ENCOUNTER — Ambulatory Visit: Admit: 2019-11-20 | Discharge: 2019-11-21 | Payer: MEDICARE

## 2019-11-20 DIAGNOSIS — C7931 Secondary malignant neoplasm of brain: Principal | ICD-10-CM

## 2019-11-22 MED ORDER — PREGABALIN 200 MG CAPSULE
ORAL_CAPSULE | 0 refills | 0 days | Status: CP
Start: 2019-11-22 — End: ?

## 2019-11-25 ENCOUNTER — Institutional Professional Consult (permissible substitution)
Admit: 2019-11-25 | Discharge: 2019-11-26 | Payer: MEDICARE | Attending: Student in an Organized Health Care Education/Training Program | Primary: Student in an Organized Health Care Education/Training Program

## 2019-11-27 ENCOUNTER — Ambulatory Visit: Admit: 2019-11-27 | Discharge: 2019-11-28 | Payer: MEDICARE

## 2019-11-29 ENCOUNTER — Ambulatory Visit: Admit: 2019-11-29 | Discharge: 2019-11-30 | Payer: MEDICARE

## 2019-11-29 DIAGNOSIS — C50811 Malignant neoplasm of overlapping sites of right female breast: Principal | ICD-10-CM

## 2019-11-29 DIAGNOSIS — R110 Nausea: Principal | ICD-10-CM

## 2019-12-03 ENCOUNTER — Ambulatory Visit: Admit: 2019-12-03 | Discharge: 2019-12-04 | Payer: MEDICARE

## 2019-12-03 DIAGNOSIS — J449 Chronic obstructive pulmonary disease, unspecified: Principal | ICD-10-CM

## 2019-12-03 DIAGNOSIS — F17200 Nicotine dependence, unspecified, uncomplicated: Principal | ICD-10-CM

## 2019-12-03 DIAGNOSIS — R062 Wheezing: Principal | ICD-10-CM

## 2019-12-03 DIAGNOSIS — F329 Major depressive disorder, single episode, unspecified: Principal | ICD-10-CM

## 2019-12-03 MED ORDER — ALBUTEROL SULFATE HFA 90 MCG/ACTUATION AEROSOL INHALER
Freq: Four times a day (QID) | RESPIRATORY_TRACT | 5 refills | 0 days | Status: CP | PRN
Start: 2019-12-03 — End: 2020-12-02

## 2019-12-04 DIAGNOSIS — I639 Cerebral infarction, unspecified: Principal | ICD-10-CM

## 2019-12-10 ENCOUNTER — Ambulatory Visit: Admit: 2019-12-10 | Discharge: 2019-12-11 | Payer: MEDICARE

## 2019-12-18 ENCOUNTER — Ambulatory Visit: Admit: 2019-12-18 | Discharge: 2019-12-18 | Payer: MEDICARE

## 2019-12-18 DIAGNOSIS — R110 Nausea: Principal | ICD-10-CM

## 2019-12-18 DIAGNOSIS — C50811 Malignant neoplasm of overlapping sites of right female breast: Principal | ICD-10-CM

## 2019-12-23 MED ORDER — POTASSIUM CHLORIDE ER 10 MEQ TABLET,EXTENDED RELEASE
ORAL_TABLET | 0 refills | 0 days | Status: CP
Start: 2019-12-23 — End: ?

## 2019-12-25 DIAGNOSIS — E876 Hypokalemia: Principal | ICD-10-CM

## 2019-12-25 MED ORDER — POTASSIUM CHLORIDE ER 10 MEQ TABLET,EXTENDED RELEASE
ORAL_TABLET | Freq: Every day | ORAL | 2 refills | 30.00000 days | Status: CP
Start: 2019-12-25 — End: ?

## 2020-01-03 ENCOUNTER — Ambulatory Visit: Admit: 2020-01-03 | Discharge: 2020-01-08 | Payer: MEDICARE

## 2020-01-06 MED ORDER — HYDROCODONE 5 MG-ACETAMINOPHEN 325 MG TABLET
ORAL_TABLET | Freq: Three times a day (TID) | ORAL | 0 refills | 0 days | Status: CP | PRN
Start: 2020-01-06 — End: ?

## 2020-01-08 DIAGNOSIS — M542 Cervicalgia: Principal | ICD-10-CM

## 2020-01-09 ENCOUNTER — Other Ambulatory Visit: Admit: 2020-01-09 | Discharge: 2020-01-10 | Payer: MEDICARE

## 2020-01-09 ENCOUNTER — Ambulatory Visit: Admit: 2020-01-09 | Discharge: 2020-01-10 | Payer: MEDICARE

## 2020-01-09 ENCOUNTER — Ambulatory Visit
Admit: 2020-01-09 | Discharge: 2020-01-10 | Payer: MEDICARE | Attending: Geriatric Medicine | Primary: Geriatric Medicine

## 2020-01-09 DIAGNOSIS — R197 Diarrhea, unspecified: Principal | ICD-10-CM

## 2020-01-09 DIAGNOSIS — E876 Hypokalemia: Principal | ICD-10-CM

## 2020-01-09 DIAGNOSIS — C50919 Malignant neoplasm of unspecified site of unspecified female breast: Principal | ICD-10-CM

## 2020-01-09 DIAGNOSIS — Z170 Estrogen receptor positive status [ER+]: Principal | ICD-10-CM

## 2020-01-09 DIAGNOSIS — C50811 Malignant neoplasm of overlapping sites of right female breast: Principal | ICD-10-CM

## 2020-01-09 DIAGNOSIS — R911 Solitary pulmonary nodule: Principal | ICD-10-CM

## 2020-01-09 DIAGNOSIS — M542 Cervicalgia: Principal | ICD-10-CM

## 2020-01-09 DIAGNOSIS — Z720 Tobacco use: Principal | ICD-10-CM

## 2020-01-09 DIAGNOSIS — R110 Nausea: Principal | ICD-10-CM

## 2020-01-09 DIAGNOSIS — E878 Other disorders of electrolyte and fluid balance, not elsewhere classified: Principal | ICD-10-CM

## 2020-01-09 MED ORDER — POTASSIUM CHLORIDE ER 10 MEQ TABLET,EXTENDED RELEASE
ORAL_TABLET | Freq: Every day | ORAL | 2 refills | 30.00000 days | Status: CP
Start: 2020-01-09 — End: ?

## 2020-01-09 MED ORDER — CALCIUM-MAGNESIUM-ZINC 333 MG-133 MG-8.3 MG TABLET
ORAL_TABLET | Freq: Every day | ORAL | 0 refills | 0 days | Status: CP
Start: 2020-01-09 — End: ?

## 2020-01-09 MED ORDER — CYCLOBENZAPRINE 10 MG TABLET
ORAL_TABLET | Freq: Three times a day (TID) | ORAL | 0 refills | 10.00000 days | Status: CP | PRN
Start: 2020-01-09 — End: 2020-01-09

## 2020-01-09 MED ORDER — CYCLOBENZAPRINE 10 MG TABLET: tablet | Freq: Three times a day (TID) | 0 refills | 10 days | Status: AC

## 2020-01-10 DIAGNOSIS — K219 Gastro-esophageal reflux disease without esophagitis: Principal | ICD-10-CM

## 2020-01-10 MED ORDER — ESOMEPRAZOLE MAGNESIUM 40 MG CAPSULE,DELAYED RELEASE
ORAL_CAPSULE | Freq: Every day | ORAL | 3 refills | 90.00000 days | Status: CP
Start: 2020-01-10 — End: ?

## 2020-01-16 ENCOUNTER — Telehealth: Admit: 2020-01-16 | Discharge: 2020-01-17 | Payer: MEDICARE | Attending: Psychiatry | Primary: Psychiatry

## 2020-01-16 ENCOUNTER — Ambulatory Visit: Admit: 2020-01-16 | Discharge: 2020-01-17 | Payer: MEDICARE

## 2020-01-16 DIAGNOSIS — F419 Anxiety disorder, unspecified: Principal | ICD-10-CM

## 2020-01-17 ENCOUNTER — Ambulatory Visit
Admit: 2020-01-17 | Discharge: 2020-01-26 | Payer: MEDICARE | Attending: Radiation Oncology | Primary: Radiation Oncology

## 2020-01-17 MED ORDER — CLONAZEPAM 1 MG TABLET
ORAL_TABLET | Freq: Every day | ORAL | 0 refills | 30.00000 days | Status: CP | PRN
Start: 2020-01-17 — End: 2020-02-16

## 2020-01-21 DIAGNOSIS — F5101 Primary insomnia: Principal | ICD-10-CM

## 2020-01-22 ENCOUNTER — Telehealth: Admit: 2020-01-22 | Discharge: 2020-01-23 | Payer: MEDICARE

## 2020-01-24 MED ORDER — TRAZODONE 50 MG TABLET
ORAL_TABLET | Freq: Every evening | ORAL | 0 refills | 30.00000 days | Status: CP
Start: 2020-01-24 — End: 2020-05-23

## 2020-01-24 MED ORDER — LISINOPRIL 20 MG-HYDROCHLOROTHIAZIDE 12.5 MG TABLET
ORAL_TABLET | 0 refills | 0 days | Status: CP
Start: 2020-01-24 — End: ?

## 2020-01-31 ENCOUNTER — Telehealth: Admit: 2020-01-31 | Discharge: 2020-02-01 | Payer: MEDICARE

## 2020-02-03 ENCOUNTER — Other Ambulatory Visit: Admit: 2020-02-03 | Discharge: 2020-02-04 | Payer: MEDICARE

## 2020-02-03 ENCOUNTER — Ambulatory Visit: Admit: 2020-02-03 | Discharge: 2020-02-04 | Payer: MEDICARE

## 2020-02-03 ENCOUNTER — Ambulatory Visit
Admit: 2020-02-03 | Discharge: 2020-02-04 | Payer: MEDICARE | Attending: Geriatric Medicine | Primary: Geriatric Medicine

## 2020-02-03 DIAGNOSIS — M549 Dorsalgia, unspecified: Principal | ICD-10-CM

## 2020-02-03 DIAGNOSIS — M542 Cervicalgia: Principal | ICD-10-CM

## 2020-02-03 DIAGNOSIS — Z170 Estrogen receptor positive status [ER+]: Principal | ICD-10-CM

## 2020-02-03 DIAGNOSIS — F5101 Primary insomnia: Principal | ICD-10-CM

## 2020-02-03 DIAGNOSIS — E878 Other disorders of electrolyte and fluid balance, not elsewhere classified: Principal | ICD-10-CM

## 2020-02-03 DIAGNOSIS — F419 Anxiety disorder, unspecified: Principal | ICD-10-CM

## 2020-02-03 DIAGNOSIS — R197 Diarrhea, unspecified: Principal | ICD-10-CM

## 2020-02-03 DIAGNOSIS — I5189 Other ill-defined heart diseases: Principal | ICD-10-CM

## 2020-02-03 DIAGNOSIS — C50811 Malignant neoplasm of overlapping sites of right female breast: Secondary | ICD-10-CM

## 2020-02-03 DIAGNOSIS — K219 Gastro-esophageal reflux disease without esophagitis: Principal | ICD-10-CM

## 2020-02-03 MED ORDER — ESOMEPRAZOLE MAGNESIUM 40 MG CAPSULE,DELAYED RELEASE
ORAL_CAPSULE | Freq: Every day | ORAL | 3 refills | 90.00000 days | Status: CP
Start: 2020-02-03 — End: ?

## 2020-02-12 DIAGNOSIS — C50919 Malignant neoplasm of unspecified site of unspecified female breast: Principal | ICD-10-CM

## 2020-02-12 DIAGNOSIS — R911 Solitary pulmonary nodule: Principal | ICD-10-CM

## 2020-02-12 DIAGNOSIS — R197 Diarrhea, unspecified: Principal | ICD-10-CM

## 2020-02-12 DIAGNOSIS — R918 Other nonspecific abnormal finding of lung field: Principal | ICD-10-CM

## 2020-02-12 DIAGNOSIS — Z720 Tobacco use: Principal | ICD-10-CM

## 2020-02-12 DIAGNOSIS — R627 Adult failure to thrive: Principal | ICD-10-CM

## 2020-02-12 MED ORDER — PREGABALIN 200 MG CAPSULE
ORAL_CAPSULE | 0 refills | 0 days | Status: CP
Start: 2020-02-12 — End: ?

## 2020-02-13 DIAGNOSIS — M542 Cervicalgia: Principal | ICD-10-CM

## 2020-02-14 ENCOUNTER — Institutional Professional Consult (permissible substitution): Admit: 2020-02-14 | Discharge: 2020-02-15 | Payer: MEDICARE

## 2020-02-14 ENCOUNTER — Ambulatory Visit: Admit: 2020-02-14 | Discharge: 2020-02-15 | Payer: MEDICARE | Attending: Adult Health | Primary: Adult Health

## 2020-02-14 DIAGNOSIS — C50811 Malignant neoplasm of overlapping sites of right female breast: Secondary | ICD-10-CM

## 2020-02-14 DIAGNOSIS — C7931 Secondary malignant neoplasm of brain: Principal | ICD-10-CM

## 2020-02-14 DIAGNOSIS — Z170 Estrogen receptor positive status [ER+]: Secondary | ICD-10-CM

## 2020-02-18 ENCOUNTER — Ambulatory Visit: Admit: 2020-02-18 | Discharge: 2020-02-19 | Payer: MEDICARE | Attending: Family | Primary: Family

## 2020-02-18 DIAGNOSIS — F419 Anxiety disorder, unspecified: Principal | ICD-10-CM

## 2020-02-18 DIAGNOSIS — F5101 Primary insomnia: Principal | ICD-10-CM

## 2020-02-18 DIAGNOSIS — G893 Neoplasm related pain (acute) (chronic): Principal | ICD-10-CM

## 2020-02-18 DIAGNOSIS — E878 Other disorders of electrolyte and fluid balance, not elsewhere classified: Principal | ICD-10-CM

## 2020-02-18 DIAGNOSIS — M542 Cervicalgia: Principal | ICD-10-CM

## 2020-02-18 DIAGNOSIS — R197 Diarrhea, unspecified: Principal | ICD-10-CM

## 2020-02-19 DIAGNOSIS — G893 Neoplasm related pain (acute) (chronic): Principal | ICD-10-CM

## 2020-02-19 MED ORDER — HYDROCODONE 5 MG-ACETAMINOPHEN 325 MG TABLET
ORAL_TABLET | Freq: Three times a day (TID) | ORAL | 0 refills | 0 days | Status: CP | PRN
Start: 2020-02-19 — End: ?

## 2020-02-20 ENCOUNTER — Ambulatory Visit: Admit: 2020-02-20 | Discharge: 2020-02-21 | Payer: MEDICARE

## 2020-02-25 ENCOUNTER — Ambulatory Visit: Admit: 2020-02-25 | Discharge: 2020-02-26 | Payer: MEDICARE

## 2020-02-25 DIAGNOSIS — Z720 Tobacco use: Principal | ICD-10-CM

## 2020-02-25 DIAGNOSIS — R918 Other nonspecific abnormal finding of lung field: Principal | ICD-10-CM

## 2020-02-25 DIAGNOSIS — R197 Diarrhea, unspecified: Principal | ICD-10-CM

## 2020-02-25 DIAGNOSIS — R627 Adult failure to thrive: Principal | ICD-10-CM

## 2020-02-25 DIAGNOSIS — C7931 Secondary malignant neoplasm of brain: Principal | ICD-10-CM

## 2020-02-25 DIAGNOSIS — C50919 Malignant neoplasm of unspecified site of unspecified female breast: Principal | ICD-10-CM

## 2020-02-28 ENCOUNTER — Institutional Professional Consult (permissible substitution): Admit: 2020-02-28 | Discharge: 2020-02-28 | Payer: MEDICARE

## 2020-02-28 ENCOUNTER — Ambulatory Visit: Admit: 2020-02-28 | Discharge: 2020-02-28 | Payer: MEDICARE

## 2020-02-28 DIAGNOSIS — C50811 Malignant neoplasm of overlapping sites of right female breast: Principal | ICD-10-CM

## 2020-02-28 DIAGNOSIS — Z170 Estrogen receptor positive status [ER+]: Principal | ICD-10-CM

## 2020-02-28 DIAGNOSIS — R110 Nausea: Principal | ICD-10-CM

## 2020-02-28 DIAGNOSIS — F419 Anxiety disorder, unspecified: Principal | ICD-10-CM

## 2020-02-28 DIAGNOSIS — C7931 Secondary malignant neoplasm of brain: Principal | ICD-10-CM

## 2020-02-28 DIAGNOSIS — F329 Major depressive disorder, single episode, unspecified: Principal | ICD-10-CM

## 2020-02-28 MED ORDER — VENLAFAXINE ER 75 MG CAPSULE,EXTENDED RELEASE 24 HR
ORAL_CAPSULE | 2 refills | 0 days | Status: CP
Start: 2020-02-28 — End: ?

## 2020-02-28 MED ORDER — VENLAFAXINE ER 150 MG CAPSULE,EXTENDED RELEASE 24 HR
ORAL_CAPSULE | 2 refills | 0 days | Status: CP
Start: 2020-02-28 — End: ?

## 2020-03-02 ENCOUNTER — Ambulatory Visit: Admit: 2020-03-02 | Discharge: 2020-03-02 | Payer: MEDICARE

## 2020-03-02 ENCOUNTER — Ambulatory Visit: Admit: 2020-03-02 | Discharge: 2020-03-02 | Payer: MEDICARE | Attending: Family | Primary: Family

## 2020-03-02 DIAGNOSIS — J449 Chronic obstructive pulmonary disease, unspecified: Principal | ICD-10-CM

## 2020-03-02 DIAGNOSIS — R062 Wheezing: Principal | ICD-10-CM

## 2020-03-02 DIAGNOSIS — M542 Cervicalgia: Principal | ICD-10-CM

## 2020-03-02 DIAGNOSIS — M5030 Other cervical disc degeneration, unspecified cervical region: Principal | ICD-10-CM

## 2020-03-02 DIAGNOSIS — C50919 Malignant neoplasm of unspecified site of unspecified female breast: Principal | ICD-10-CM

## 2020-03-02 DIAGNOSIS — G620 Drug-induced polyneuropathy: Principal | ICD-10-CM

## 2020-03-02 DIAGNOSIS — F329 Major depressive disorder, single episode, unspecified: Principal | ICD-10-CM

## 2020-03-02 DIAGNOSIS — E785 Hyperlipidemia, unspecified: Principal | ICD-10-CM

## 2020-03-02 DIAGNOSIS — F5101 Primary insomnia: Principal | ICD-10-CM

## 2020-03-02 DIAGNOSIS — Z720 Tobacco use: Principal | ICD-10-CM

## 2020-03-02 DIAGNOSIS — I10 Essential (primary) hypertension: Principal | ICD-10-CM

## 2020-03-02 DIAGNOSIS — R911 Solitary pulmonary nodule: Principal | ICD-10-CM

## 2020-03-05 ENCOUNTER — Ambulatory Visit: Admit: 2020-03-05 | Discharge: 2020-03-05 | Payer: MEDICARE

## 2020-03-05 ENCOUNTER — Encounter
Admit: 2020-03-05 | Discharge: 2020-03-05 | Payer: MEDICARE | Attending: Certified Registered" | Primary: Certified Registered"

## 2020-03-05 MED ORDER — CLONAZEPAM 1 MG TABLET
ORAL_TABLET | 0 refills | 0 days | Status: CP
Start: 2020-03-05 — End: ?

## 2020-03-06 DIAGNOSIS — S00522A Blister (nonthermal) of oral cavity, initial encounter: Principal | ICD-10-CM

## 2020-03-06 DIAGNOSIS — C50919 Malignant neoplasm of unspecified site of unspecified female breast: Principal | ICD-10-CM

## 2020-03-06 DIAGNOSIS — S00522S Blister (nonthermal) of oral cavity, sequela: Principal | ICD-10-CM

## 2020-03-06 DIAGNOSIS — Z720 Tobacco use: Principal | ICD-10-CM

## 2020-03-06 DIAGNOSIS — C50811 Malignant neoplasm of overlapping sites of right female breast: Principal | ICD-10-CM

## 2020-03-06 MED ORDER — MAGIC MOUTHWASH (NYSTATIN/DIPHENHYDRAMINE/MYLANTA) WITH LIDOCAINE ORAL MIXTURE: 5 mL | mL | Freq: Four times a day (QID) | 2 refills | 0 days | Status: AC

## 2020-03-06 MED ORDER — MAGIC MOUTHWASH (NYSTATIN/DIPHENHYDRAMINE/MYLANTA) WITH LIDOCAINE ORAL MIXTURE
Freq: Four times a day (QID) | ORAL | 2 refills | 0.00000 days | Status: CP | PRN
Start: 2020-03-06 — End: 2020-03-06

## 2020-03-11 ENCOUNTER — Institutional Professional Consult (permissible substitution): Admit: 2020-03-11 | Discharge: 2020-03-12 | Payer: MEDICARE

## 2020-03-13 ENCOUNTER — Ambulatory Visit: Admit: 2020-03-13 | Discharge: 2020-03-13 | Payer: MEDICARE

## 2020-03-13 ENCOUNTER — Institutional Professional Consult (permissible substitution): Admit: 2020-03-13 | Discharge: 2020-03-13 | Payer: MEDICARE

## 2020-03-13 ENCOUNTER — Ambulatory Visit: Admit: 2020-03-13 | Discharge: 2020-03-13 | Payer: MEDICARE | Attending: Adult Health | Primary: Adult Health

## 2020-03-13 DIAGNOSIS — Z170 Estrogen receptor positive status [ER+]: Secondary | ICD-10-CM

## 2020-03-13 DIAGNOSIS — C50811 Malignant neoplasm of overlapping sites of right female breast: Principal | ICD-10-CM

## 2020-03-13 DIAGNOSIS — C7931 Secondary malignant neoplasm of brain: Principal | ICD-10-CM

## 2020-03-17 DIAGNOSIS — R197 Diarrhea, unspecified: Principal | ICD-10-CM

## 2020-03-17 DIAGNOSIS — M542 Cervicalgia: Principal | ICD-10-CM

## 2020-03-17 DIAGNOSIS — G6289 Other specified polyneuropathies: Principal | ICD-10-CM

## 2020-03-17 DIAGNOSIS — F5101 Primary insomnia: Principal | ICD-10-CM

## 2020-03-17 DIAGNOSIS — C50919 Malignant neoplasm of unspecified site of unspecified female breast: Principal | ICD-10-CM

## 2020-03-17 MED ORDER — DIPHENOXYLATE-ATROPINE 2.5 MG-0.025 MG TABLET
ORAL_TABLET | Freq: Four times a day (QID) | ORAL | 1 refills | 8 days | Status: CP | PRN
Start: 2020-03-17 — End: ?

## 2020-03-17 MED ORDER — PREGABALIN 200 MG CAPSULE
ORAL_CAPSULE | 0 refills | 0 days | Status: CP
Start: 2020-03-17 — End: ?

## 2020-03-17 MED ORDER — CYCLOBENZAPRINE 10 MG TABLET
ORAL_TABLET | Freq: Three times a day (TID) | ORAL | 0 refills | 10 days | Status: CP | PRN
Start: 2020-03-17 — End: 2021-03-17

## 2020-03-17 MED ORDER — TRAZODONE 50 MG TABLET
ORAL_TABLET | Freq: Every evening | ORAL | 0 refills | 30 days | Status: CP
Start: 2020-03-17 — End: 2020-07-15

## 2020-03-19 ENCOUNTER — Telehealth: Admit: 2020-03-19 | Discharge: 2020-03-20 | Payer: MEDICARE | Attending: Psychiatry | Primary: Psychiatry

## 2020-03-23 ENCOUNTER — Ambulatory Visit: Admit: 2020-03-23 | Discharge: 2020-03-24 | Payer: MEDICARE

## 2020-03-23 ENCOUNTER — Other Ambulatory Visit: Admit: 2020-03-23 | Discharge: 2020-03-24 | Payer: MEDICARE

## 2020-03-23 DIAGNOSIS — C50919 Malignant neoplasm of unspecified site of unspecified female breast: Principal | ICD-10-CM

## 2020-03-23 DIAGNOSIS — Z170 Estrogen receptor positive status [ER+]: Principal | ICD-10-CM

## 2020-03-23 DIAGNOSIS — R110 Nausea: Principal | ICD-10-CM

## 2020-03-23 DIAGNOSIS — C50811 Malignant neoplasm of overlapping sites of right female breast: Principal | ICD-10-CM

## 2020-03-23 MED ORDER — PROCHLORPERAZINE MALEATE 10 MG TABLET
ORAL_TABLET | Freq: Four times a day (QID) | ORAL | 1 refills | 8 days | Status: CP | PRN
Start: 2020-03-23 — End: ?

## 2020-03-23 MED FILL — PROCHLORPERAZINE MALEATE 10 MG TABLET: 7 days supply | Qty: 30 | Fill #0 | Status: AC

## 2020-03-24 DIAGNOSIS — D151 Benign neoplasm of heart: Principal | ICD-10-CM

## 2020-03-24 MED ORDER — BIOTENE MOISTURIZING MOUTH MUCOSAL SPRAY
ORAL | prn refills | 0 days | Status: CP | PRN
Start: 2020-03-24 — End: ?

## 2020-03-24 MED ORDER — CALCIUM-MAGNESIUM-ZINC 333 MG-133 MG-8.3 MG TABLET
ORAL_TABLET | Freq: Every day | ORAL | 12 refills | 0.00000 days | Status: CP
Start: 2020-03-24 — End: ?

## 2020-03-24 MED ORDER — PROCHLORPERAZINE MALEATE 10 MG TABLET
ORAL_TABLET | Freq: Four times a day (QID) | ORAL | 6 refills | 8 days | Status: CP | PRN
Start: 2020-03-24 — End: ?
  Filled 2020-03-23: qty 30, 7d supply, fill #0

## 2020-03-30 ENCOUNTER — Telehealth: Admit: 2020-03-30 | Discharge: 2020-03-31 | Payer: MEDICARE

## 2020-03-30 DIAGNOSIS — R29898 Other symptoms and signs involving the musculoskeletal system: Principal | ICD-10-CM

## 2020-03-30 DIAGNOSIS — K117 Disturbances of salivary secretion: Principal | ICD-10-CM

## 2020-03-30 DIAGNOSIS — C50919 Malignant neoplasm of unspecified site of unspecified female breast: Principal | ICD-10-CM

## 2020-03-30 DIAGNOSIS — G893 Neoplasm related pain (acute) (chronic): Principal | ICD-10-CM

## 2020-03-30 DIAGNOSIS — R251 Tremor, unspecified: Principal | ICD-10-CM

## 2020-03-30 DIAGNOSIS — Z515 Encounter for palliative care: Principal | ICD-10-CM

## 2020-04-02 MED ORDER — CLONAZEPAM 1 MG TABLET
ORAL_TABLET | 0 refills | 0 days | Status: CP
Start: 2020-04-02 — End: ?

## 2020-04-08 ENCOUNTER — Institutional Professional Consult (permissible substitution): Admit: 2020-04-08 | Discharge: 2020-04-09 | Payer: MEDICARE

## 2020-04-13 ENCOUNTER — Ambulatory Visit: Admit: 2020-04-13 | Discharge: 2020-04-14 | Payer: MEDICARE | Attending: Adult Health | Primary: Adult Health

## 2020-04-13 ENCOUNTER — Other Ambulatory Visit: Admit: 2020-04-13 | Discharge: 2020-04-14 | Payer: MEDICARE

## 2020-04-13 ENCOUNTER — Ambulatory Visit: Admit: 2020-04-13 | Discharge: 2020-04-14 | Payer: MEDICARE

## 2020-04-13 DIAGNOSIS — R110 Nausea: Principal | ICD-10-CM

## 2020-04-13 DIAGNOSIS — C50811 Malignant neoplasm of overlapping sites of right female breast: Principal | ICD-10-CM

## 2020-04-13 DIAGNOSIS — C50919 Malignant neoplasm of unspecified site of unspecified female breast: Principal | ICD-10-CM

## 2020-04-24 ENCOUNTER — Ambulatory Visit: Admit: 2020-04-24 | Discharge: 2020-04-25 | Payer: MEDICARE

## 2020-04-24 DIAGNOSIS — I5189 Other ill-defined heart diseases: Principal | ICD-10-CM

## 2020-04-24 DIAGNOSIS — F17200 Nicotine dependence, unspecified, uncomplicated: Principal | ICD-10-CM

## 2020-04-24 DIAGNOSIS — D151 Benign neoplasm of heart: Principal | ICD-10-CM

## 2020-04-24 DIAGNOSIS — I209 Angina pectoris, unspecified: Principal | ICD-10-CM

## 2020-04-27 DIAGNOSIS — C50919 Malignant neoplasm of unspecified site of unspecified female breast: Principal | ICD-10-CM

## 2020-04-27 DIAGNOSIS — G6289 Other specified polyneuropathies: Principal | ICD-10-CM

## 2020-04-27 MED ORDER — PREGABALIN 200 MG CAPSULE
ORAL_CAPSULE | 0 refills | 0.00000 days | Status: CP
Start: 2020-04-27 — End: ?

## 2020-04-29 MED ORDER — VARENICLINE 0.5 MG (11)-1 MG (42) TABLETS IN A DOSE PACK
0 refills | 0 days | Status: CP
Start: 2020-04-29 — End: 2020-07-28

## 2020-04-29 MED ORDER — VARENICLINE 1 MG TABLET
ORAL_TABLET | Freq: Two times a day (BID) | ORAL | 1 refills | 30.00000 days | Status: CP
Start: 2020-04-29 — End: 2020-07-28

## 2020-04-29 MED ORDER — NICOTINE (POLACRILEX) 4 MG BUCCAL LOZENGE
BUCCAL | 2 refills | 5.00000 days | Status: CP | PRN
Start: 2020-04-29 — End: 2020-05-29

## 2020-04-30 ENCOUNTER — Ambulatory Visit
Admit: 2020-04-30 | Discharge: 2020-05-01 | Payer: MEDICARE | Attending: Physical Medicine & Rehabilitation | Primary: Physical Medicine & Rehabilitation

## 2020-04-30 DIAGNOSIS — M542 Cervicalgia: Principal | ICD-10-CM

## 2020-04-30 DIAGNOSIS — M5030 Other cervical disc degeneration, unspecified cervical region: Principal | ICD-10-CM

## 2020-04-30 MED ORDER — BACLOFEN 5 MG TABLET
ORAL_TABLET | Freq: Three times a day (TID) | ORAL | 0 refills | 30 days | Status: CP | PRN
Start: 2020-04-30 — End: 2020-05-30

## 2020-05-04 ENCOUNTER — Ambulatory Visit: Admit: 2020-05-04 | Discharge: 2020-05-04 | Payer: MEDICARE

## 2020-05-04 DIAGNOSIS — C50919 Malignant neoplasm of unspecified site of unspecified female breast: Principal | ICD-10-CM

## 2020-05-04 DIAGNOSIS — C50811 Malignant neoplasm of overlapping sites of right female breast: Principal | ICD-10-CM

## 2020-05-04 DIAGNOSIS — R110 Nausea: Principal | ICD-10-CM

## 2020-05-05 ENCOUNTER — Telehealth: Admit: 2020-05-05 | Discharge: 2020-05-06 | Payer: MEDICARE

## 2020-05-05 DIAGNOSIS — F17200 Nicotine dependence, unspecified, uncomplicated: Principal | ICD-10-CM

## 2020-05-05 DIAGNOSIS — G893 Neoplasm related pain (acute) (chronic): Principal | ICD-10-CM

## 2020-05-05 DIAGNOSIS — Z515 Encounter for palliative care: Principal | ICD-10-CM

## 2020-05-05 DIAGNOSIS — C50811 Malignant neoplasm of overlapping sites of right female breast: Principal | ICD-10-CM

## 2020-05-05 MED ORDER — HYDROCODONE 10 MG-ACETAMINOPHEN 325 MG TABLET
ORAL_TABLET | Freq: Four times a day (QID) | ORAL | 0 refills | 23 days | Status: CP | PRN
Start: 2020-05-05 — End: ?

## 2020-05-07 ENCOUNTER — Ambulatory Visit: Admit: 2020-05-07 | Discharge: 2020-05-07 | Payer: MEDICARE

## 2020-05-12 DIAGNOSIS — G6289 Other specified polyneuropathies: Principal | ICD-10-CM

## 2020-05-12 DIAGNOSIS — C50919 Malignant neoplasm of unspecified site of unspecified female breast: Principal | ICD-10-CM

## 2020-05-13 ENCOUNTER — Institutional Professional Consult (permissible substitution): Admit: 2020-05-13 | Discharge: 2020-05-14 | Payer: MEDICARE

## 2020-05-15 DIAGNOSIS — F5101 Primary insomnia: Principal | ICD-10-CM

## 2020-05-19 MED ORDER — TRAZODONE 50 MG TABLET
ORAL_TABLET | Freq: Every evening | ORAL | 3 refills | 30 days | Status: CP
Start: 2020-05-19 — End: 2020-09-16

## 2020-05-21 ENCOUNTER — Ambulatory Visit: Admit: 2020-05-21 | Discharge: 2020-05-23 | Payer: MEDICARE

## 2020-05-21 ENCOUNTER — Ambulatory Visit: Admit: 2020-05-21 | Discharge: 2020-05-22 | Payer: MEDICARE

## 2020-05-21 ENCOUNTER — Ambulatory Visit: Admit: 2020-05-21 | Discharge: 2020-06-03 | Payer: MEDICARE

## 2020-05-26 ENCOUNTER — Ambulatory Visit
Admit: 2020-05-26 | Discharge: 2020-06-24 | Payer: MEDICARE | Attending: Radiation Oncology | Primary: Radiation Oncology

## 2020-05-26 ENCOUNTER — Ambulatory Visit
Admit: 2020-05-26 | Discharge: 2020-05-27 | Payer: MEDICARE | Attending: Radiation Oncology | Primary: Radiation Oncology

## 2020-05-27 ENCOUNTER — Institutional Professional Consult (permissible substitution): Admit: 2020-05-27 | Discharge: 2020-05-28 | Payer: MEDICARE

## 2020-05-28 ENCOUNTER — Ambulatory Visit: Admit: 2020-05-28 | Discharge: 2020-05-28 | Payer: MEDICARE

## 2020-05-28 ENCOUNTER — Telehealth
Admit: 2020-05-28 | Discharge: 2020-05-28 | Payer: MEDICARE | Attending: Geriatric Medicine | Primary: Geriatric Medicine

## 2020-05-28 ENCOUNTER — Institutional Professional Consult (permissible substitution): Admit: 2020-05-28 | Discharge: 2020-05-28 | Payer: MEDICARE

## 2020-05-28 DIAGNOSIS — C50811 Malignant neoplasm of overlapping sites of right female breast: Principal | ICD-10-CM

## 2020-05-28 DIAGNOSIS — C50919 Malignant neoplasm of unspecified site of unspecified female breast: Principal | ICD-10-CM

## 2020-05-28 DIAGNOSIS — G620 Drug-induced polyneuropathy: Principal | ICD-10-CM

## 2020-05-28 DIAGNOSIS — T451X5A Adverse effect of antineoplastic and immunosuppressive drugs, initial encounter: Secondary | ICD-10-CM

## 2020-05-28 DIAGNOSIS — F419 Anxiety disorder, unspecified: Principal | ICD-10-CM

## 2020-05-28 DIAGNOSIS — F5101 Primary insomnia: Principal | ICD-10-CM

## 2020-05-28 DIAGNOSIS — Z170 Estrogen receptor positive status [ER+]: Principal | ICD-10-CM

## 2020-05-28 DIAGNOSIS — R5383 Other fatigue: Principal | ICD-10-CM

## 2020-05-28 DIAGNOSIS — E876 Hypokalemia: Principal | ICD-10-CM

## 2020-05-28 DIAGNOSIS — G6289 Other specified polyneuropathies: Principal | ICD-10-CM

## 2020-05-28 DIAGNOSIS — C7931 Secondary malignant neoplasm of brain: Principal | ICD-10-CM

## 2020-05-28 DIAGNOSIS — R110 Nausea: Principal | ICD-10-CM

## 2020-05-28 DIAGNOSIS — F329 Major depressive disorder, single episode, unspecified: Principal | ICD-10-CM

## 2020-05-28 DIAGNOSIS — K219 Gastro-esophageal reflux disease without esophagitis: Principal | ICD-10-CM

## 2020-05-28 MED ORDER — POTASSIUM CHLORIDE ER 10 MEQ TABLET,EXTENDED RELEASE
ORAL_TABLET | Freq: Every day | ORAL | 2 refills | 30.00000 days | Status: CP
Start: 2020-05-28 — End: ?

## 2020-05-28 MED ORDER — TRAZODONE 50 MG TABLET
ORAL_TABLET | Freq: Every evening | ORAL | 3 refills | 30.00000 days | Status: CP
Start: 2020-05-28 — End: 2020-09-25

## 2020-05-28 MED ORDER — VENLAFAXINE ER 150 MG CAPSULE,EXTENDED RELEASE 24 HR
ORAL_CAPSULE | 2 refills | 0 days | Status: CP
Start: 2020-05-28 — End: ?

## 2020-05-28 MED ORDER — ESOMEPRAZOLE MAGNESIUM 40 MG CAPSULE,DELAYED RELEASE
ORAL_CAPSULE | Freq: Every day | ORAL | 3 refills | 90 days | Status: CP
Start: 2020-05-28 — End: ?

## 2020-05-28 MED ORDER — PREGABALIN 200 MG CAPSULE
ORAL_CAPSULE | 0 refills | 0 days | Status: CP
Start: 2020-05-28 — End: ?

## 2020-05-28 MED ORDER — VENLAFAXINE ER 75 MG CAPSULE,EXTENDED RELEASE 24 HR
ORAL_CAPSULE | 2 refills | 0 days | Status: CP
Start: 2020-05-28 — End: ?

## 2020-05-29 ENCOUNTER — Telehealth: Admit: 2020-05-29 | Discharge: 2020-05-30 | Payer: MEDICARE

## 2020-06-02 DIAGNOSIS — Z720 Tobacco use: Principal | ICD-10-CM

## 2020-06-02 DIAGNOSIS — R062 Wheezing: Principal | ICD-10-CM

## 2020-06-03 DIAGNOSIS — M5030 Other cervical disc degeneration, unspecified cervical region: Principal | ICD-10-CM

## 2020-06-03 DIAGNOSIS — M542 Cervicalgia: Principal | ICD-10-CM

## 2020-06-04 ENCOUNTER — Telehealth: Admit: 2020-06-04 | Discharge: 2020-06-05 | Payer: MEDICARE | Attending: Psychiatry | Primary: Psychiatry

## 2020-06-05 DIAGNOSIS — R7989 Other specified abnormal findings of blood chemistry: Principal | ICD-10-CM

## 2020-06-10 ENCOUNTER — Ambulatory Visit
Admit: 2020-06-10 | Discharge: 2020-06-11 | Payer: MEDICARE | Attending: Student in an Organized Health Care Education/Training Program | Primary: Student in an Organized Health Care Education/Training Program

## 2020-06-10 DIAGNOSIS — R7989 Other specified abnormal findings of blood chemistry: Principal | ICD-10-CM

## 2020-06-10 DIAGNOSIS — R251 Tremor, unspecified: Principal | ICD-10-CM

## 2020-06-10 DIAGNOSIS — G629 Polyneuropathy, unspecified: Principal | ICD-10-CM

## 2020-06-10 DIAGNOSIS — K117 Disturbances of salivary secretion: Principal | ICD-10-CM

## 2020-06-10 MED ORDER — NICOTINE 21 MG/24 HR DAILY TRANSDERMAL PATCH
MEDICATED_PATCH | TRANSDERMAL | 2 refills | 28.00000 days | Status: CP
Start: 2020-06-10 — End: ?

## 2020-06-10 MED ORDER — NICOTINE (POLACRILEX) 4 MG GUM
BUCCAL | 2 refills | 5 days | Status: CP | PRN
Start: 2020-06-10 — End: 2020-07-10

## 2020-06-10 MED ORDER — BUPROPION HCL 75 MG TABLET
ORAL_TABLET | Freq: Two times a day (BID) | ORAL | 3 refills | 30.00000 days | Status: CP
Start: 2020-06-10 — End: 2020-07-10

## 2020-06-16 ENCOUNTER — Ambulatory Visit: Admit: 2020-06-16 | Payer: MEDICARE

## 2020-06-16 DIAGNOSIS — G620 Drug-induced polyneuropathy: Principal | ICD-10-CM

## 2020-06-16 DIAGNOSIS — G629 Polyneuropathy, unspecified: Principal | ICD-10-CM

## 2020-06-16 MED ORDER — PREGABALIN 100 MG CAPSULE
ORAL_CAPSULE | 0 refills | 0.00000 days | Status: CP
Start: 2020-06-16 — End: ?

## 2020-06-16 MED ORDER — THIAMINE HCL (VITAMIN B1) 50 MG TABLET
ORAL_TABLET | Freq: Every day | ORAL | 1 refills | 30.00000 days | Status: CP
Start: 2020-06-16 — End: 2020-08-15

## 2020-06-16 MED ORDER — CHOLECALCIFEROL (VITAMIN D3) 250 MCG (10,000 UNIT) CAPSULE
ORAL_CAPSULE | ORAL | 1 refills | 28 days | Status: CP
Start: 2020-06-16 — End: 2020-08-15

## 2020-06-23 ENCOUNTER — Ambulatory Visit: Admit: 2020-06-23 | Discharge: 2020-06-24 | Payer: MEDICARE

## 2020-06-23 ENCOUNTER — Other Ambulatory Visit: Admit: 2020-06-23 | Discharge: 2020-06-24 | Payer: MEDICARE

## 2020-06-23 DIAGNOSIS — R110 Nausea: Principal | ICD-10-CM

## 2020-06-23 DIAGNOSIS — C50811 Malignant neoplasm of overlapping sites of right female breast: Principal | ICD-10-CM

## 2020-06-23 DIAGNOSIS — C50919 Malignant neoplasm of unspecified site of unspecified female breast: Principal | ICD-10-CM

## 2020-06-23 MED ORDER — METHADONE 5 MG TABLET
ORAL_TABLET | Freq: Two times a day (BID) | ORAL | 0 refills | 18.00000 days | Status: CP
Start: 2020-06-23 — End: ?

## 2020-06-24 DIAGNOSIS — C50919 Malignant neoplasm of unspecified site of unspecified female breast: Principal | ICD-10-CM

## 2020-06-24 DIAGNOSIS — G6289 Other specified polyneuropathies: Principal | ICD-10-CM

## 2020-06-24 MED ORDER — PREGABALIN 200 MG CAPSULE
ORAL_CAPSULE | 0 refills | 0 days | Status: CP
Start: 2020-06-24 — End: ?

## 2020-07-14 ENCOUNTER — Institutional Professional Consult (permissible substitution): Admit: 2020-07-14 | Discharge: 2020-07-14 | Payer: MEDICARE

## 2020-07-14 ENCOUNTER — Ambulatory Visit: Admit: 2020-07-14 | Discharge: 2020-07-14 | Payer: MEDICARE

## 2020-07-14 ENCOUNTER — Ambulatory Visit
Admit: 2020-07-14 | Discharge: 2020-07-14 | Payer: MEDICARE | Attending: Geriatric Medicine | Primary: Geriatric Medicine

## 2020-07-14 DIAGNOSIS — C50811 Malignant neoplasm of overlapping sites of right female breast: Principal | ICD-10-CM

## 2020-07-14 DIAGNOSIS — R110 Nausea: Principal | ICD-10-CM

## 2020-07-14 DIAGNOSIS — Z170 Estrogen receptor positive status [ER+]: Principal | ICD-10-CM

## 2020-07-14 DIAGNOSIS — T451X5A Adverse effect of antineoplastic and immunosuppressive drugs, initial encounter: Secondary | ICD-10-CM

## 2020-07-14 DIAGNOSIS — Z5111 Encounter for antineoplastic chemotherapy: Principal | ICD-10-CM

## 2020-07-14 DIAGNOSIS — C50919 Malignant neoplasm of unspecified site of unspecified female breast: Principal | ICD-10-CM

## 2020-07-14 DIAGNOSIS — C7931 Secondary malignant neoplasm of brain: Principal | ICD-10-CM

## 2020-07-14 DIAGNOSIS — L299 Pruritus, unspecified: Principal | ICD-10-CM

## 2020-07-14 DIAGNOSIS — R197 Diarrhea, unspecified: Principal | ICD-10-CM

## 2020-07-14 DIAGNOSIS — R42 Dizziness and giddiness: Principal | ICD-10-CM

## 2020-07-14 DIAGNOSIS — K521 Toxic gastroenteritis and colitis: Principal | ICD-10-CM

## 2020-07-14 DIAGNOSIS — G620 Drug-induced polyneuropathy: Principal | ICD-10-CM

## 2020-07-15 MED ORDER — CLONAZEPAM 0.5 MG TABLET
ORAL_TABLET | 0 refills | 0 days | Status: CP
Start: 2020-07-15 — End: ?

## 2020-07-17 ENCOUNTER — Ambulatory Visit: Admit: 2020-07-17 | Discharge: 2020-07-18 | Payer: MEDICARE

## 2020-07-20 ENCOUNTER — Ambulatory Visit: Admit: 2020-07-20 | Discharge: 2020-07-21 | Payer: MEDICARE

## 2020-07-20 DIAGNOSIS — I10 Essential (primary) hypertension: Principal | ICD-10-CM

## 2020-07-20 DIAGNOSIS — Z720 Tobacco use: Principal | ICD-10-CM

## 2020-07-20 DIAGNOSIS — M5030 Other cervical disc degeneration, unspecified cervical region: Principal | ICD-10-CM

## 2020-07-20 DIAGNOSIS — F3341 Major depressive disorder, recurrent, in partial remission: Principal | ICD-10-CM

## 2020-07-20 DIAGNOSIS — I639 Cerebral infarction, unspecified: Principal | ICD-10-CM

## 2020-07-20 DIAGNOSIS — C7931 Secondary malignant neoplasm of brain: Principal | ICD-10-CM

## 2020-07-20 DIAGNOSIS — C50919 Malignant neoplasm of unspecified site of unspecified female breast: Principal | ICD-10-CM

## 2020-07-20 DIAGNOSIS — G620 Drug-induced polyneuropathy: Principal | ICD-10-CM

## 2020-07-20 DIAGNOSIS — R062 Wheezing: Principal | ICD-10-CM

## 2020-07-20 DIAGNOSIS — E785 Hyperlipidemia, unspecified: Principal | ICD-10-CM

## 2020-07-20 DIAGNOSIS — D151 Benign neoplasm of heart: Principal | ICD-10-CM

## 2020-07-20 MED ORDER — LISINOPRIL 20 MG-HYDROCHLOROTHIAZIDE 25 MG TABLET
ORAL_TABLET | Freq: Every day | ORAL | 3 refills | 90 days | Status: CP
Start: 2020-07-20 — End: 2021-07-20

## 2020-07-21 ENCOUNTER — Institutional Professional Consult (permissible substitution): Admit: 2020-07-21 | Discharge: 2020-07-22 | Payer: MEDICARE

## 2020-07-21 MED ORDER — PREGABALIN 100 MG CAPSULE
ORAL_CAPSULE | Freq: Three times a day (TID) | ORAL | 1 refills | 30 days | Status: CP
Start: 2020-07-21 — End: 2020-09-19

## 2020-08-04 ENCOUNTER — Ambulatory Visit: Admit: 2020-08-04 | Discharge: 2020-08-04 | Payer: MEDICARE

## 2020-08-04 DIAGNOSIS — C50919 Malignant neoplasm of unspecified site of unspecified female breast: Principal | ICD-10-CM

## 2020-08-04 DIAGNOSIS — T451X5A Adverse effect of antineoplastic and immunosuppressive drugs, initial encounter: Secondary | ICD-10-CM

## 2020-08-04 DIAGNOSIS — R110 Nausea: Principal | ICD-10-CM

## 2020-08-04 DIAGNOSIS — C50811 Malignant neoplasm of overlapping sites of right female breast: Principal | ICD-10-CM

## 2020-08-04 DIAGNOSIS — D6481 Anemia due to antineoplastic chemotherapy: Principal | ICD-10-CM

## 2020-08-05 DIAGNOSIS — M542 Cervicalgia: Principal | ICD-10-CM

## 2020-08-05 DIAGNOSIS — M5030 Other cervical disc degeneration, unspecified cervical region: Principal | ICD-10-CM

## 2020-08-10 ENCOUNTER — Other Ambulatory Visit: Payer: Self-pay

## 2020-08-10 ENCOUNTER — Ambulatory Visit: Payer: Medicare HMO | Attending: Physical Medicine & Rehabilitation | Admitting: Physical Therapy

## 2020-08-10 ENCOUNTER — Encounter: Payer: Self-pay | Admitting: Physical Therapy

## 2020-08-10 DIAGNOSIS — G8929 Other chronic pain: Secondary | ICD-10-CM | POA: Diagnosis present

## 2020-08-10 DIAGNOSIS — M545 Low back pain: Secondary | ICD-10-CM | POA: Insufficient documentation

## 2020-08-10 DIAGNOSIS — R262 Difficulty in walking, not elsewhere classified: Secondary | ICD-10-CM | POA: Insufficient documentation

## 2020-08-10 DIAGNOSIS — M542 Cervicalgia: Secondary | ICD-10-CM | POA: Insufficient documentation

## 2020-08-10 NOTE — Therapy (Addendum)
[facility] [address], [place], [address] Phone: [phone]   Fax:  [phone]  Physical Therapy Treatment  Patient Details  Name: Barbara Malone MRN: [id number] Date of Birth: 11-30-1956 No data recorded  Encounter Date: 08/10/2020    Past Medical History:  Diagnosis Date  . Anxiety   . Brain tumor ([place])   . Cancer [facility])    breast, metastatic, active  . Depression    still active; takes meds for depression;   . Hyperlipidemia    controllled with medication;   Marland Kitchen Metastatic breast cancer ([place])   . Peripheral neuropathy     Past Surgical History:  Procedure Laterality Date  . CARPAL TUNNEL RELEASE    . CESAREAN SECTION    . TRIGGER FINGER RELEASE      There were no vitals filed for this visit.      OBJECTIVE  MUSCULOSKELETAL: Tremor: Absent Bulk: Normal Tone: Normal, no clonus  Posture Forward head rounded shoulders  Gait Decreased speed, wide BOS, L stagger with L head rotation  Palpation: hypomobility throughout lower tspine and upper lumbar spine with patient reporting pain that subsides with continued mobilization. Trigger points at bilat pec minor and UT.  Strength R/L 5/5 Hip flexion 5/5 Hip external rotation 5/5 Hip internal rotation 5/5 Hip extension  5/5 Hip abduction 3-/3 Y lower trap  4-/4- T scapular retraction All shoulder strength 5/5 bilat; except shoulder ER 4/5 bilat  AROM R/L 60/60 Cervical rotation Painful bilat concordant sign 50% limited Cervical ext with pain that increases with overpressure WNL cervical flex WNL lateral bending with some pain at UT  SPECIAL TESTS Spurlings A (ipsilateral lateral flexion/axial compression): Negative bilat Spurlings B (ipsilateral lateral flexion/contralateral rotation/axial compression): R: Positive L: Negative  Distraction Test: Positive Hoffman Sign (cervical cord compression): Negative bilat ULTT Median:  Negative bilat ULTT Ulnar: Negative bilat ULTT Radial: Negative bilat SLR: Negative bilat Slump: Negative bilat  NEUROLOGICAL:  Mental Status Patient is oriented to person, place and time.  Recent memory is intact.  Remote memory is intact.  Attention span and concentration are intact.  Expressive speech is intact.  Patient's fund of knowledge is within normal limits for educational level.  Cranial Nerves Visual acuity and visual fields are intact  Extraocular muscles are intact  Facial sensation is intact bilaterally  Facial strength is intact bilaterally  Hearing is normal as tested by gross conversation Palate elevates midline, normal phonation  Shoulder shrug strength is intact  Tongue protrudes midline   Sensation Grossly intact to light touch bilateral UEs/LEs as determined by testing dermatomes C2-T2/L2-S2 respectively; decreased sensation to bilat dorsum of feet Proprioception and hot/cold testing deferred on this date  Coordination/Cerebellar Finger to Nose: WNL Heel to Shin: WNL Rapid alternating movements: WNL Finger Opposition: WNL Pronator Drift: Negative   FUNCTIONAL OUTCOME MEASURES   Results Comments  DGI 15/24   FGA 15/30 With L head turns heavy L drift  TUG 13.26seconds Deviation with turns, difficulty with tandem ambulation, and severe difficulty with obstacle negotiation  5TSTS 25 seconds UEs needed  10 Meter Gait Speed Self-selected: s = 0.77m/s; Below normative values for full community ambulation    POSTURAL CONTROL TESTS   Modified Clinical Test of Sensory Interaction for Balance    (CTSIB):  CONDITION TIME STRATEGY SWAY  Eyes open, firm surface 30 seconds ankle   Eyes closed, firm surface 30 seconds ankle Mild  Eyes open, foam surface 30 seconds Ankle/hip Increased  without LOB  Eyes closed, foam surface 15 seconds minA to maintain balance Increased with LOB without assistance     Ther-Ex PT reviewed the following HEP with patient  with patient able to demonstrate a set of the following with min cuing for correction needed. PT educated patient on parameters of therex (how/when to inc/decrease intensity, frequency, rep/set range, stretch hold time, and purpose of therex) with verbalized understanding. Education on postural dysfunction and length/tension relationship involved. Access Code: [email or web address] Seated cervical retraction - 8 x daily - 7 x weekly - 10 reps - 2sec hold Cervical Rotation SNAG - 3 x daily - 7 x weekly - 10 reps - 2-3sec hold                                      PT Short Term Goals - 08/10/20 1524      PT SHORT TERM GOAL #1   Title Pt will be independent with HEP for carryover between sessions    Baseline 08/10/20    Time 4    Period Weeks    Status New             PT Long Term Goals - 08/10/20 1538      PT LONG TERM GOAL #1   Title Pt will demonstrate full active cervical rotation in order to drive safely    Baseline 08/10/20 60d    Time 8    Period Weeks    Status New      PT LONG TERM GOAL #2   Title Pt will decrease 5TSTS by at least 3 seconds in order to demonstrate clinically significant improvement in LE strength.    Baseline 08/10/20  25seconds with use of LUE    Time 8    Period Weeks    Status New      PT LONG TERM GOAL #3   Title Pt will decrease worst pain as reported on NPRS by at least 3 points in order to demonstrate clinically significant reduction in pain.    Baseline 08/10/20 10/10 neck pain with prolonged standing or cervical rotation    Time 8    Period Weeks    Status New      PT LONG TERM GOAL #4   Title Pt will improve DGI by at least 3 points in order to demonstrate clinically significant improvement in balance and decreased risk for falls.    Baseline 08/10/20 15/24    Time 8    Period Weeks    Status New      PT LONG TERM GOAL #5   Title Patient will increase FOTO score to 79 to demonstrate predicted increase in functional  mobility to complete ADLs    Baseline 08/10/20 FOTO to be assessed next visit    Time 8    Period Weeks    Status New      Additional Long Term Goals   Additional Long Term Goals Yes      PT LONG TERM GOAL #6   Title Pt will improve FGA by at least 4 points in order to demonstrate clinically significant improvement in balance and decreased risk for falls    Baseline 08/10/20 15/30    Time 8    Period Weeks    Status --                  Patient will benefit from skilled  therapeutic intervention in order to improve the following deficits and impairments:  Abnormal gait, Decreased activity tolerance, Decreased endurance, Decreased balance, Decreased mobility, Decreased range of motion, Difficulty walking, Decreased strength, Hypomobility, Increased fascial restricitons, Impaired perceived functional ability, Impaired flexibility, Postural dysfunction, Impaired UE functional use, Improper body mechanics, Pain  Visit Diagnosis: Cervicalgia - Plan: PT plan of care cert/re-cert  Chronic midline low back pain without sciatica - Plan: PT plan of care cert/re-cert  Difficulty in walking, not elsewhere classified - Plan: PT plan of care cert/re-cert     Problem List There are no problems to display for this patient.  Durwin Reges DPT Durwin Reges 08/18/2020, 2:09 PM  [facility] [address], [place], [address] Phone: [phone]   Fax:  [phone]  Name: Barbara Malone MRN: [id number] Date of Birth: December 29, 1955

## 2020-08-13 ENCOUNTER — Telehealth: Admit: 2020-08-13 | Discharge: 2020-08-14 | Payer: MEDICARE | Attending: Psychiatry | Primary: Psychiatry

## 2020-08-13 MED ORDER — VENLAFAXINE ER 150 MG CAPSULE,EXTENDED RELEASE 24 HR
ORAL_CAPSULE | 2 refills | 0 days | Status: CP
Start: 2020-08-13 — End: ?

## 2020-08-13 MED ORDER — VENLAFAXINE ER 75 MG CAPSULE,EXTENDED RELEASE 24 HR
ORAL_CAPSULE | 2 refills | 0 days | Status: CP
Start: 2020-08-13 — End: ?

## 2020-08-17 ENCOUNTER — Encounter: Payer: Medicare HMO | Admitting: Physical Therapy

## 2020-08-18 ENCOUNTER — Encounter: Payer: Self-pay | Admitting: Physical Therapy

## 2020-08-18 ENCOUNTER — Other Ambulatory Visit: Payer: Self-pay

## 2020-08-18 ENCOUNTER — Ambulatory Visit: Payer: Medicare HMO | Admitting: Physical Therapy

## 2020-08-18 DIAGNOSIS — M542 Cervicalgia: Secondary | ICD-10-CM | POA: Diagnosis not present

## 2020-08-18 DIAGNOSIS — R262 Difficulty in walking, not elsewhere classified: Secondary | ICD-10-CM

## 2020-08-18 DIAGNOSIS — G8929 Other chronic pain: Secondary | ICD-10-CM

## 2020-08-18 NOTE — Therapy (Signed)
[place] [facility] [address] [place], [address] Phone: [phone]   Fax:  [phone]  Physical Therapy Treatment  Patient Details  Name: Barbara Malone MRN: [id number] Date of Birth: 18-May-1956 No data recorded  Encounter Date: 08/18/2020   PT End of Session - 08/18/20 1417    Visit Number 2    Number of Visits 17    Date for PT Re-Evaluation 10/09/20    Authorization - Visit Number 2    Authorization - Number of Visits 10    PT Start Time 0145    PT Stop Time 0223    PT Time Calculation (min) 38 min    Activity Tolerance Patient tolerated treatment well    Behavior During Therapy [facility] for tasks assessed/performed           Past Medical History:  Diagnosis Date  . Anxiety   . Brain tumor ([place])   . Cancer [facility])    breast, metastatic, active  . Depression    still active; takes meds for depression;   . Hyperlipidemia    controllled with medication;   Marland Kitchen Metastatic breast cancer ([place])   . Peripheral neuropathy     Past Surgical History:  Procedure Laterality Date  . CARPAL TUNNEL RELEASE    . CESAREAN SECTION    . TRIGGER FINGER RELEASE      There were no vitals filed for this visit.   Subjective Assessment - 08/18/20 1349    Subjective Patient reports she thinks her neck is feeling a little better and that her motion is a little better. Reports she has been stumbling a little, but d/t neuropathy, as she has not had dizziness since last visit. Reports no pain today.    Pertinent History Patient is a 64 year old female presenting with cervical and low back pain. PMH R metastatic breast cancer with brain tumor removal Jan 2021. METS to L lung. Currently undergoing chemo through L chest port every 3 weeks, 81min at a time. Endorses bilat pedal periphreal neuropathy. Patient reports neck and back pain over the past year with insideous onset, and she has noticed decreased balance with this. Endorses 1 fall  in the past six months descending stairs at apartment, and reports feeling most unsteady when she turns her head with walking, stepping on/off onto uneven surfaces. Has a SPC but she does not use it, drives (in town only), lives alone in apartment with 7stairs to enter/exit with one handrails, has handicap accessible bathroom with shower seat, grab bars, and raised toilet seat. Paitent has no family/friends to help her, and reports d/t this sometimes she doesn't eat because she doesn't enjoy cooking for herself. Patient reports she has increased neck and back pain with pushing/pulling, lifting, prolonged stading, walking. Worst pain 10/10, best 0/10 and starts in the neck and radiates to low back. Pt endorses short term memory loss, word finding, and critical reasoning; has sister as healthcare power of attorney. Pt denies N/V, B&B changes, unexplained weight fluctuation, saddle paresthesia, fever, night sweats, or unrelenting night pain at this time.    Limitations Standing;Lifting;House hold activities    How long can you sit comfortably? unlimited    How long can you stand comfortably? 57mins    How long can you walk comfortably? 57mins    Patient Stated Goals Be able to walk and cook more    Pain Onset More than a month ago  Cervical rotation R/L 59/61  Manual STM with trigger point release to suboccipitals and upper cervical paraspinals UPA C0-1 with rotation grade 3 30sec bouts 4 bouts each segments CPA grade 2 C0-2 to reduce pain 4 bouts each segment  Full AROM following  Ther-Ex Supine cervical retraction + lift 5x 5sec with cuing initially for proper technique with good carry over following Seated bilat shoulder ER RTB with cuing for set up with cervical retraction with good carry over Standing rows RTB 3x 10 with demo and TC/VC to prevent shoulder hiking, and activate scapular retractors with good carry over  Cervical retraction x12 with min cuing forrection with good carry  over  Cervical rotation SNAG x2 each direction with min cuing for correction with good carry over Seated UT stretch x53min each direction                          PT Education - 08/18/20 1415    Education Details HEP review, therex form/technique    Person(s) Educated Patient    Methods Explanation;Demonstration;Verbal cues;Tactile cues    Comprehension Verbalized understanding;Returned demonstration;Verbal cues required;Tactile cues required            PT Short Term Goals - 08/10/20 1524      PT SHORT TERM GOAL #1   Title Pt will be independent with HEP for carryover between sessions    Baseline 08/10/20    Time 4    Period Weeks    Status New             PT Long Term Goals - 08/18/20 1459      PT LONG TERM GOAL #1   Title Pt will demonstrate full active cervical rotation in order to drive safely    Baseline 08/10/20 60d bukat    Time 8    Period Weeks    Status New      PT LONG TERM GOAL #2   Title Pt will decrease 5TSTS by at least 3 seconds in order to demonstrate clinically significant improvement in LE strength.    Baseline 08/10/20  25seconds with use of LUE    Time 8    Period Weeks    Status New      PT LONG TERM GOAL #3   Title Pt will decrease worst pain as reported on NPRS by at least 3 points in order to demonstrate clinically significant reduction in pain.    Baseline 08/10/20 10/10 neck pain with prolonged standing or cervical rotation    Time 8    Period Weeks    Status New      PT LONG TERM GOAL #4   Title Pt will improve DGI by at least 3 points in order to demonstrate clinically significant improvement in balance and decreased risk for falls.    Baseline 08/10/20 15/24    Time 8    Period Weeks    Status New      PT LONG TERM GOAL #5   Title Patient will increase FOTO score to 47 to demonstrate predicted increase in functional mobility to complete ADLs    Baseline 08/18/20 39    Time 8    Period Weeks    Status New       PT LONG TERM GOAL #6   Title Pt will improve FGA by at least 4 points in order to demonstrate clinically significant improvement in balance and decreased risk for falls    Baseline 08/10/20  15/30    Time 8    Period Weeks                 Plan - 08/18/20 1429    Clinical Impression Statement PT utilized manual techniques to reduce pain and improve ROM, and therex to correct postural length/tension relationship and motor control with success. Patient with full AROM following manual techniques. Patient is able to comply with all cuing for proper technique of therex, with good motivation and no increased pain throughout session. PT will continue progression as able.    Personal Factors and Comorbidities Age;Behavior Pattern;Comorbidity 1;Comorbidity 2;Fitness;Past/Current Experience;Sex;Time since onset of injury/illness/exacerbation    Comorbidities metastatic breast cancer, brain tumor    Examination-Activity Limitations Carry;Lift;Stand;Stairs;Transfers;Reach Overhead    Examination-Participation Restrictions Yard Work;Meal Prep;Driving;Community Activity    Stability/Clinical Decision Making Evolving/Moderate complexity    Clinical Decision Making Moderate    Rehab Potential Good    PT Frequency 2x / week    PT Duration 8 weeks    PT Treatment/Interventions ADLs/Self Care Home Management;Cryotherapy;Electrical Stimulation;Traction;Moist Heat;Ultrasound;DME Instruction;Gait Scientist, forensic;Therapeutic activities;Therapeutic exercise;Passive range of motion;Spinal Manipulations;Joint Manipulations;Dry needling;Functional mobility training;Balance training;Patient/family education;Neuromuscular re-education;Manual techniques    PT Next Visit Plan cervical ROM, balance,    PT Home Exercise Plan cervical retraction, rotation SNG    Consulted and Agree with Plan of Care Patient           Patient will benefit from skilled therapeutic intervention in order to improve the  following deficits and impairments:  Abnormal gait, Decreased activity tolerance, Decreased endurance, Decreased balance, Decreased mobility, Decreased range of motion, Difficulty walking, Decreased strength, Hypomobility, Increased fascial restricitons, Impaired perceived functional ability, Impaired flexibility, Postural dysfunction, Impaired UE functional use, Improper body mechanics, Pain  Visit Diagnosis: Cervicalgia  Chronic midline low back pain without sciatica  Difficulty in walking, not elsewhere classified     Problem List There are no problems to display for this patient.  Durwin Reges DPT Durwin Reges 08/18/2020, 3:06 PM  [place] [facility] [address] [place], [address] Phone: [phone]   Fax:  [phone]  Name: Donnamaria Shands MRN: [id number] Date of Birth: 1956/07/11

## 2020-08-19 ENCOUNTER — Ambulatory Visit: Payer: Medicare HMO | Admitting: Physical Therapy

## 2020-08-24 ENCOUNTER — Encounter: Payer: Self-pay | Admitting: Physical Therapy

## 2020-08-24 ENCOUNTER — Other Ambulatory Visit: Payer: Self-pay

## 2020-08-24 ENCOUNTER — Ambulatory Visit: Payer: Medicare HMO | Admitting: Physical Therapy

## 2020-08-24 DIAGNOSIS — M542 Cervicalgia: Secondary | ICD-10-CM

## 2020-08-24 DIAGNOSIS — R262 Difficulty in walking, not elsewhere classified: Secondary | ICD-10-CM

## 2020-08-24 DIAGNOSIS — M545 Low back pain, unspecified: Secondary | ICD-10-CM

## 2020-08-24 NOTE — Therapy (Signed)
[place] [facility] [address], [place], [address] Phone: [phone]   Fax:  [phone]  Physical Therapy Treatment  Patient Details  Name: Barbara Malone MRN: [id number] Date of Birth: [birth year] No data recorded  Encounter Date: 08/24/2020   PT End of Session - 08/24/20 1405    Visit Number 3    Number of Visits 17    Date for PT Re-Evaluation 10/09/20    Authorization - Visit Number 3    Authorization - Number of Visits 10    PT Start Time 0145    PT Stop Time 0225    PT Time Calculation (min) 40 min    Activity Tolerance Patient tolerated treatment well    Behavior During Therapy [facility] for tasks assessed/performed           Past Medical History:  Diagnosis Date  . Anxiety   . Brain tumor ([place])   . Cancer [facility])    breast, metastatic, active  . Depression    still active; takes meds for depression;   . Hyperlipidemia    controllled with medication;   Marland Kitchen Metastatic breast cancer ([place])   . Peripheral neuropathy     Past Surgical History:  Procedure Laterality Date  . CARPAL TUNNEL RELEASE    . CESAREAN SECTION    . TRIGGER FINGER RELEASE      There were no vitals filed for this visit.   Subjective Assessment - 08/24/20 1348    Subjective Patient reports she was feeling good until today when she driving to PT appt. She reports her vision was blurry on her way over here and she had to close one eye, and that made her neck hurt worse. Reports 8/10 pain at R UT.    Pertinent History Patient is a 64 year old female presenting with cervical and low back pain. PMH R metastatic breast cancer with brain tumor removal Jan 2021. METS to L lung. Currently undergoing chemo through L chest port every 3 weeks, 58min at a time. Endorses bilat pedal periphreal neuropathy. Patient reports neck and back pain over the past year with insideous onset, and she has noticed decreased balance with this. Endorses 1 fall in the  past six months descending stairs at apartment, and reports feeling most unsteady when she turns her head with walking, stepping on/off onto uneven surfaces. Has a SPC but she does not use it, drives (in town only), lives alone in apartment with 7stairs to enter/exit with one handrails, has handicap accessible bathroom with shower seat, grab bars, and raised toilet seat. Paitent has no family/friends to help her, and reports d/t this sometimes she doesn't eat because she doesn't enjoy cooking for herself. Patient reports she has increased neck and back pain with pushing/pulling, lifting, prolonged stading, walking. Worst pain 10/10, best 0/10 and starts in the neck and radiates to low back. Pt endorses short term memory loss, word finding, and critical reasoning; has sister as healthcare power of attorney. Pt denies N/V, B&B changes, unexplained weight fluctuation, saddle paresthesia, fever, night sweats, or unrelenting night pain at this time.    Limitations Standing;Lifting;House hold activities    How long can you sit comfortably? unlimited    How long can you stand comfortably? 24mins    How long can you walk comfortably? 10mins    Diagnostic tests X-ray of shoulder on 10/24/17: "Radiographs on this date showed interval healing".  MRI of brain (due to falls) August, 2018: 7  small ring-enhancing lesions.      Patient Stated Goals Be able to walk and cook more    Pain Onset More than a month ago    Pain Onset More than a month ago    Pain Onset More than a month ago             Cervical rotation R/L 59/61  Manual STM with trigger point release to suboccipitals and upper cervical paraspinals UPA C0-1 with rotation grade 3 30sec bouts 4 bouts each segments CPA grade 2 C0-2 to reduce pain 4 bouts each segment  Full AROM following  Ther-Ex Supine cervical retraction + lift 10x 5sec with cuing initially for proper technique with good carry over following OMEGA 10# standing row 10# 3x 10/8/8  with demo and cuing for proper technique without shoulder hiking with good carry over Seated bilat shoulder ER GTB 3x 8 with cuing for technique with good carry over Prone Y 2x 7/6 with max cuing for proper technique and scapular motion with good carry over following Seated UT stretch x58min each direction           PT Education - 08/24/20 1405    Education provided Yes    Education Details therex fom/technique    Person(s) Educated Patient    Methods Explanation;Demonstration;Verbal cues    Comprehension Verbalized understanding;Returned demonstration;Verbal cues required            PT Short Term Goals - 08/10/20 1524      PT SHORT TERM GOAL #1   Title Pt will be independent with HEP for carryover between sessions    Baseline 08/10/20    Time 4    Period Weeks    Status New             PT Long Term Goals - 08/18/20 1459      PT LONG TERM GOAL #1   Title Pt will demonstrate full active cervical rotation in order to drive safely    Baseline 08/10/20 60d bukat    Time 8    Period Weeks    Status New      PT LONG TERM GOAL #2   Title Pt will decrease 5TSTS by at least 3 seconds in order to demonstrate clinically significant improvement in LE strength.    Baseline 08/10/20  25seconds with use of LUE    Time 8    Period Weeks    Status New      PT LONG TERM GOAL #3   Title Pt will decrease worst pain as reported on NPRS by at least 3 points in order to demonstrate clinically significant reduction in pain.    Baseline 08/10/20 10/10 neck pain with prolonged standing or cervical rotation    Time 8    Period Weeks    Status New      PT LONG TERM GOAL #4   Title Pt will improve DGI by at least 3 points in order to demonstrate clinically significant improvement in balance and decreased risk for falls.    Baseline 08/10/20 15/24    Time 8    Period Weeks    Status New      PT LONG TERM GOAL #5   Title Patient will increase FOTO score to 47 to demonstrate predicted  increase in functional mobility to complete ADLs    Baseline 08/18/20 39    Time 8    Period Weeks    Status New      PT LONG  TERM GOAL #6   Title Pt will improve FGA by at least 4 points in order to demonstrate clinically significant improvement in balance and decreased risk for falls    Baseline 08/10/20 15/30    Time 8    Period Weeks                 Plan - 08/24/20 1411    Clinical Impression Statement PT continued to utilize manual techniques for pain reduction and to decrease soft tissue tension with success; 4/10 pain following. PT continued therex progression for increased postural strengthening with success. Patietn is able to comply with all cuing for proper technique of therex with good motivation throughout session. PT will continue progression as able.    Personal Factors and Comorbidities Age;Behavior Pattern;Comorbidity 1;Comorbidity 2;Fitness;Past/Current Experience;Sex;Time since onset of injury/illness/exacerbation    Comorbidities metastatic breast cancer, brain tumor    Examination-Activity Limitations Carry;Lift;Stand;Stairs;Transfers;Reach Overhead    Examination-Participation Restrictions Yard Work;Meal Prep;Driving;Community Activity    Stability/Clinical Decision Making Evolving/Moderate complexity    Clinical Decision Making Moderate    Rehab Potential Good    PT Frequency 2x / week    PT Duration 8 weeks    PT Treatment/Interventions ADLs/Self Care Home Management;Cryotherapy;Electrical Stimulation;Traction;Moist Heat;Ultrasound;DME Instruction;Gait Scientist, forensic;Therapeutic activities;Therapeutic exercise;Passive range of motion;Spinal Manipulations;Joint Manipulations;Dry needling;Functional mobility training;Balance training;Patient/family education;Neuromuscular re-education;Manual techniques    PT Next Visit Plan cervical ROM, balance,    PT Home Exercise Plan cervical retraction, rotation SNG    Consulted and Agree with Plan of Care Patient            Patient will benefit from skilled therapeutic intervention in order to improve the following deficits and impairments:  Abnormal gait, Decreased activity tolerance, Decreased endurance, Decreased balance, Decreased mobility, Decreased range of motion, Difficulty walking, Decreased strength, Hypomobility, Increased fascial restricitons, Impaired perceived functional ability, Impaired flexibility, Postural dysfunction, Impaired UE functional use, Improper body mechanics, Pain  Visit Diagnosis: Cervicalgia  Chronic midline low back pain without sciatica  Difficulty in walking, not elsewhere classified     Problem List There are no problems to display for this patient.  Durwin Reges DPT Durwin Reges 08/24/2020, 2:21 PM   [place] [facility] [address], [place], [address] Phone: [phone]   Fax:  [phone]  Name: Barbara Malone MRN: [id number] Date of Birth: 1956/06/07

## 2020-08-25 ENCOUNTER — Ambulatory Visit: Admit: 2020-08-25 | Discharge: 2020-08-26 | Payer: MEDICARE

## 2020-08-25 DIAGNOSIS — R110 Nausea: Principal | ICD-10-CM

## 2020-08-25 DIAGNOSIS — C50919 Malignant neoplasm of unspecified site of unspecified female breast: Principal | ICD-10-CM

## 2020-08-25 DIAGNOSIS — C50811 Malignant neoplasm of overlapping sites of right female breast: Principal | ICD-10-CM

## 2020-08-26 ENCOUNTER — Encounter: Payer: Medicare HMO | Admitting: Physical Therapy

## 2020-08-27 ENCOUNTER — Encounter: Payer: Medicare HMO | Admitting: Physical Therapy

## 2020-08-28 DIAGNOSIS — F419 Anxiety disorder, unspecified: Principal | ICD-10-CM

## 2020-09-01 ENCOUNTER — Institutional Professional Consult (permissible substitution): Admit: 2020-09-01 | Discharge: 2020-09-14 | Payer: MEDICARE

## 2020-09-01 ENCOUNTER — Ambulatory Visit: Admit: 2020-09-01 | Discharge: 2020-09-02 | Payer: MEDICARE

## 2020-09-01 ENCOUNTER — Ambulatory Visit
Admit: 2020-09-01 | Discharge: 2020-09-02 | Payer: MEDICARE | Attending: Geriatric Medicine | Primary: Geriatric Medicine

## 2020-09-01 DIAGNOSIS — G620 Drug-induced polyneuropathy: Principal | ICD-10-CM

## 2020-09-01 DIAGNOSIS — C50919 Malignant neoplasm of unspecified site of unspecified female breast: Principal | ICD-10-CM

## 2020-09-01 DIAGNOSIS — T451X5A Adverse effect of antineoplastic and immunosuppressive drugs, initial encounter: Secondary | ICD-10-CM

## 2020-09-01 DIAGNOSIS — K900 Celiac disease: Principal | ICD-10-CM

## 2020-09-01 DIAGNOSIS — R110 Nausea: Principal | ICD-10-CM

## 2020-09-01 DIAGNOSIS — C50811 Malignant neoplasm of overlapping sites of right female breast: Principal | ICD-10-CM

## 2020-09-01 MED ORDER — FOLIC ACID 1 MG TABLET
ORAL_TABLET | Freq: Every day | ORAL | 3 refills | 100.00000 days | Status: CP
Start: 2020-09-01 — End: 2021-09-01

## 2020-09-02 DIAGNOSIS — E876 Hypokalemia: Principal | ICD-10-CM

## 2020-09-02 DIAGNOSIS — R062 Wheezing: Principal | ICD-10-CM

## 2020-09-03 ENCOUNTER — Ambulatory Visit: Payer: Medicare HMO | Attending: Physical Medicine & Rehabilitation | Admitting: Physical Therapy

## 2020-09-03 ENCOUNTER — Encounter: Payer: Self-pay | Admitting: Physical Therapy

## 2020-09-03 ENCOUNTER — Other Ambulatory Visit: Payer: Self-pay

## 2020-09-03 DIAGNOSIS — G8929 Other chronic pain: Secondary | ICD-10-CM | POA: Diagnosis present

## 2020-09-03 DIAGNOSIS — M542 Cervicalgia: Secondary | ICD-10-CM | POA: Diagnosis present

## 2020-09-03 DIAGNOSIS — R262 Difficulty in walking, not elsewhere classified: Secondary | ICD-10-CM | POA: Diagnosis present

## 2020-09-03 DIAGNOSIS — M545 Low back pain, unspecified: Secondary | ICD-10-CM

## 2020-09-03 NOTE — Therapy (Signed)
[facility] [address] [place], [address] Phone: [phone]   Fax:  [phone]  Physical Therapy Treatment  Patient Details  Name: Barbara Malone MRN: [id number] Date of Birth: Oct 06, 1956 No data recorded  Encounter Date: 09/03/2020   PT End of Session - 09/03/20 1055    Visit Number 4    Number of Visits 17    Date for PT Re-Evaluation 10/09/20    Authorization Type Medicare    Authorization - Visit Number 4    Authorization - Number of Visits 10    PT Start Time 1030    PT Stop Time 1110    PT Time Calculation (min) 40 min    Activity Tolerance Patient tolerated treatment well    Behavior During Therapy [facility] for tasks assessed/performed           Past Medical History:  Diagnosis Date  . Anxiety   . Brain tumor ([facility])   . Cancer [facility])    breast, metastatic, active  . Depression    still active; takes meds for depression;   . Hyperlipidemia    controllled with medication;   Marland Kitchen Metastatic breast cancer ([place])   . Peripheral neuropathy     Past Surgical History:  Procedure Laterality Date  . CARPAL TUNNEL RELEASE    . CESAREAN SECTION    . TRIGGER FINGER RELEASE      There were no vitals filed for this visit.   Subjective Assessment - 09/03/20 1034    Subjective Patient reports she has been really busy with appts and reports her pain is better, but her motion in BUE is reduced overhead. Patient reports following last session she could see out of both eyes, instead of just one, which she was happy with. Compliance with HEP.    Pertinent History Patient is a 64 year old female presenting with cervical and low back pain. PMH R metastatic breast cancer with brain tumor removal Jan 2021. METS to L lung. Currently undergoing chemo through L chest port every 3 weeks, 54min at a time. Endorses bilat pedal periphreal neuropathy. Patient reports neck and back pain over the past year with insideous onset, and  she has noticed decreased balance with this. Endorses 1 fall in the past six months descending stairs at apartment, and reports feeling most unsteady when she turns her head with walking, stepping on/off onto uneven surfaces. Has a SPC but she does not use it, drives (in town only), lives alone in apartment with 7stairs to enter/exit with one handrails, has handicap accessible bathroom with shower seat, grab bars, and raised toilet seat. Paitent has no family/friends to help her, and reports d/t this sometimes she doesn't eat because she doesn't enjoy cooking for herself. Patient reports she has increased neck and back pain with pushing/pulling, lifting, prolonged stading, walking. Worst pain 10/10, best 0/10 and starts in the neck and radiates to low back. Pt endorses short term memory loss, word finding, and critical reasoning; has sister as healthcare power of attorney. Pt denies N/V, B&B changes, unexplained weight fluctuation, saddle paresthesia, fever, night sweats, or unrelenting night pain at this time.    Limitations Standing;Lifting;House hold activities    How long can you sit comfortably? unlimited    How long can you stand comfortably? 3mins    How long can you walk comfortably? 3mins    Diagnostic tests X-ray of shoulder on 10/24/17: "Radiographs on this date showed interval healing".  MRI  of brain (due to falls) August, 2018: 7 small ring-enhancing lesions.      Patient Stated Goals Be able to walk and cook more    Pain Onset More than a month ago    Pain Onset More than a month ago    Pain Onset More than a month ago           Cervical rotation 55d bilat with L sided pain with L rotation   Manual STM withtrigger point releaseto suboccipitals and upper cervical paraspinals UPA C0-1 with rotation grade 3 30sec bouts 4 bouts each segments CPA grade 2 C0-2 to reduce pain 4 bouts each segment  Full AROM following  Ther-Ex Supine cervical retraction + lift 8x 10sec with cuing  initially for proper technique with good carry over following Prone Y leaning on theraball 3x 10 with demo and max cuing for proper technique with decent carry over Scaption with RTB pull apart 3x 10 with max cuing for proper technique without shoulder hiking and maintaining scapular retraction with good carry over Seated bilat ER RTB 2x 10 with cuing for eccentric control with good carry over Seated UT stretch x37min each direction           PT Education - 09/03/20 1055    Education provided Yes    Education Details therex form/technique, posture    Person(s) Educated Patient    Methods Explanation;Demonstration;Verbal cues    Comprehension Verbalized understanding;Returned demonstration;Verbal cues required            PT Short Term Goals - 08/10/20 1524      PT SHORT TERM GOAL #1   Title Pt will be independent with HEP for carryover between sessions    Baseline 08/10/20    Time 4    Period Weeks    Status New             PT Long Term Goals - 09/03/20 1056      PT LONG TERM GOAL #4   Status New                 Plan - 09/03/20 1059    Clinical Impression Statement PT continued manual techniques to decrease muscle tension and improve ROM with good success, full cervical rotation following. PT continued therex progression for increased postural correction and strenghtening with patient able to comply with all multimodal cuing for proper technique of therex with good motivation throughout session; no increased pain throughout session. PT will continue progression as able.    Personal Factors and Comorbidities Age;Behavior Pattern;Comorbidity 1;Comorbidity 2;Fitness;Past/Current Experience;Sex;Time since onset of injury/illness/exacerbation    Comorbidities metastatic breast cancer, brain tumor    Examination-Activity Limitations Carry;Lift;Stand;Stairs;Transfers;Reach Overhead    Examination-Participation Restrictions Yard Work;Meal Prep;Driving;Community  Activity    Stability/Clinical Decision Making Evolving/Moderate complexity    Clinical Decision Making Moderate    Rehab Potential Good    PT Frequency 2x / week    PT Duration 8 weeks    PT Treatment/Interventions ADLs/Self Care Home Management;Cryotherapy;Electrical Stimulation;Traction;Moist Heat;Ultrasound;DME Instruction;Gait Scientist, forensic;Therapeutic activities;Therapeutic exercise;Passive range of motion;Spinal Manipulations;Joint Manipulations;Dry needling;Functional mobility training;Balance training;Patient/family education;Neuromuscular re-education;Manual techniques    PT Next Visit Plan cervical ROM, balance,    PT Home Exercise Plan cervical retraction, rotation SNG    Consulted and Agree with Plan of Care Patient           Patient will benefit from skilled therapeutic intervention in order to improve the following deficits and impairments:  Abnormal gait, Decreased activity tolerance, Decreased  endurance, Decreased balance, Decreased mobility, Decreased range of motion, Difficulty walking, Decreased strength, Hypomobility, Increased fascial restricitons, Impaired perceived functional ability, Impaired flexibility, Postural dysfunction, Impaired UE functional use, Improper body mechanics, Pain  Visit Diagnosis: Cervicalgia  Chronic midline low back pain without sciatica  Difficulty in walking, not elsewhere classified     Problem List There are no problems to display for this patient.  Durwin Reges DPT Durwin Reges 09/03/2020, 11:09 AM  [facility] [address] [place], [address] Phone: [phone]   Fax:  [phone]  Name: Barbara Malone MRN: [id number] Date of Birth: [birth year]

## 2020-09-04 ENCOUNTER — Ambulatory Visit: Admit: 2020-09-04 | Discharge: 2020-09-04 | Payer: MEDICARE | Attending: Pulmonary Disease | Primary: Pulmonary Disease

## 2020-09-04 ENCOUNTER — Ambulatory Visit: Admit: 2020-09-04 | Discharge: 2020-09-04 | Payer: MEDICARE

## 2020-09-04 ENCOUNTER — Ambulatory Visit
Admit: 2020-09-04 | Discharge: 2020-09-24 | Payer: MEDICARE | Attending: Radiation Oncology | Primary: Radiation Oncology

## 2020-09-04 DIAGNOSIS — R0600 Dyspnea, unspecified: Principal | ICD-10-CM

## 2020-09-04 DIAGNOSIS — R062 Wheezing: Principal | ICD-10-CM

## 2020-09-04 DIAGNOSIS — Z720 Tobacco use: Principal | ICD-10-CM

## 2020-09-08 ENCOUNTER — Ambulatory Visit: Payer: Medicare HMO | Admitting: Physical Therapy

## 2020-09-10 ENCOUNTER — Telehealth
Admit: 2020-09-10 | Discharge: 2020-09-11 | Payer: MEDICARE | Attending: Student in an Organized Health Care Education/Training Program | Primary: Student in an Organized Health Care Education/Training Program

## 2020-09-10 DIAGNOSIS — G629 Polyneuropathy, unspecified: Principal | ICD-10-CM

## 2020-09-10 MED ORDER — METHADONE 5 MG TABLET
ORAL_TABLET | Freq: Two times a day (BID) | ORAL | 0 refills | 18.00000 days | Status: CP
Start: 2020-09-10 — End: 2020-09-24

## 2020-09-14 ENCOUNTER — Ambulatory Visit: Payer: Medicare HMO | Admitting: Physical Therapy

## 2020-09-15 ENCOUNTER — Ambulatory Visit: Admit: 2020-09-15 | Discharge: 2020-09-16 | Payer: MEDICARE

## 2020-09-15 DIAGNOSIS — C50811 Malignant neoplasm of overlapping sites of right female breast: Principal | ICD-10-CM

## 2020-09-15 DIAGNOSIS — C50919 Malignant neoplasm of unspecified site of unspecified female breast: Principal | ICD-10-CM

## 2020-09-15 DIAGNOSIS — K900 Celiac disease: Principal | ICD-10-CM

## 2020-09-15 DIAGNOSIS — R110 Nausea: Principal | ICD-10-CM

## 2020-09-16 ENCOUNTER — Ambulatory Visit: Payer: Medicare HMO | Admitting: Physical Therapy

## 2020-09-17 ENCOUNTER — Telehealth: Admit: 2020-09-17 | Discharge: 2020-09-18 | Payer: MEDICARE

## 2020-09-21 ENCOUNTER — Ambulatory Visit: Payer: Medicare HMO | Admitting: Physical Therapy

## 2020-09-22 ENCOUNTER — Encounter: Payer: Medicare HMO | Admitting: Physical Therapy

## 2020-09-22 MED ORDER — THIAMINE HCL (VITAMIN B1) 50 MG TABLET
ORAL_TABLET | Freq: Every day | ORAL | 1 refills | 30 days | Status: CP
Start: 2020-09-22 — End: 2020-11-21

## 2020-09-24 ENCOUNTER — Ambulatory Visit: Payer: Medicare HMO | Admitting: Physical Therapy

## 2020-09-28 MED ORDER — CLONAZEPAM 0.5 MG TABLET
ORAL_TABLET | 0 refills | 0 days | Status: CP
Start: 2020-09-28 — End: ?

## 2020-10-01 ENCOUNTER — Telehealth: Admit: 2020-10-01 | Discharge: 2020-10-02 | Payer: MEDICARE | Attending: Psychiatry | Primary: Psychiatry

## 2020-10-01 MED ORDER — VENLAFAXINE ER 150 MG CAPSULE,EXTENDED RELEASE 24 HR
ORAL_CAPSULE | 2 refills | 0 days | Status: CP
Start: 2020-10-01 — End: ?

## 2020-10-01 MED ORDER — VENLAFAXINE ER 75 MG CAPSULE,EXTENDED RELEASE 24 HR
ORAL_CAPSULE | 2 refills | 0 days | Status: CP
Start: 2020-10-01 — End: ?

## 2020-10-05 ENCOUNTER — Institutional Professional Consult (permissible substitution): Admit: 2020-10-05 | Discharge: 2020-10-06 | Payer: MEDICARE

## 2020-10-06 MED ORDER — PREGABALIN 200 MG CAPSULE
ORAL_CAPSULE | 0 refills | 0 days | Status: CP
Start: 2020-10-06 — End: ?

## 2020-10-07 DIAGNOSIS — F5101 Primary insomnia: Principal | ICD-10-CM

## 2020-10-07 MED ORDER — SENNOSIDES 8.6 MG TABLET
ORAL_TABLET | Freq: Two times a day (BID) | ORAL | 2 refills | 30 days | Status: CP | PRN
Start: 2020-10-07 — End: 2021-01-05

## 2020-10-07 MED ORDER — POLYETHYLENE GLYCOL 3350 17 GRAM/DOSE ORAL POWDER
Freq: Three times a day (TID) | ORAL | 3 refills | 17 days | Status: CP | PRN
Start: 2020-10-07 — End: ?

## 2020-10-07 MED ORDER — TRAZODONE 50 MG TABLET
ORAL_TABLET | Freq: Every evening | ORAL | 0 refills | 30 days | Status: CP
Start: 2020-10-07 — End: 2021-02-04

## 2020-10-08 ENCOUNTER — Ambulatory Visit: Admit: 2020-10-08 | Discharge: 2020-10-21 | Payer: MEDICARE

## 2020-10-08 ENCOUNTER — Ambulatory Visit: Admit: 2020-10-08 | Discharge: 2020-10-09 | Payer: MEDICARE

## 2020-10-08 ENCOUNTER — Ambulatory Visit: Admit: 2020-10-08 | Discharge: 2020-10-08 | Payer: MEDICARE | Attending: Family | Primary: Family

## 2020-10-08 ENCOUNTER — Institutional Professional Consult (permissible substitution): Admit: 2020-10-08 | Discharge: 2020-10-08 | Payer: MEDICARE

## 2020-10-08 DIAGNOSIS — R0600 Dyspnea, unspecified: Principal | ICD-10-CM

## 2020-10-08 DIAGNOSIS — C50811 Malignant neoplasm of overlapping sites of right female breast: Principal | ICD-10-CM

## 2020-10-08 DIAGNOSIS — K900 Celiac disease: Principal | ICD-10-CM

## 2020-10-08 DIAGNOSIS — R110 Nausea: Principal | ICD-10-CM

## 2020-10-08 DIAGNOSIS — C50919 Malignant neoplasm of unspecified site of unspecified female breast: Principal | ICD-10-CM

## 2020-10-09 DIAGNOSIS — K900 Celiac disease: Principal | ICD-10-CM

## 2020-10-09 DIAGNOSIS — G629 Polyneuropathy, unspecified: Principal | ICD-10-CM

## 2020-10-09 MED ORDER — METHADONE 5 MG/5 ML ORAL SOLUTION
Freq: Two times a day (BID) | ORAL | 0 refills | 14.00000 days | Status: CP
Start: 2020-10-09 — End: 2020-10-23

## 2020-10-13 ENCOUNTER — Institutional Professional Consult (permissible substitution): Admit: 2020-10-13 | Discharge: 2020-10-14 | Payer: MEDICARE

## 2020-10-27 ENCOUNTER — Ambulatory Visit
Admit: 2020-10-27 | Discharge: 2020-10-27 | Payer: MEDICARE | Attending: Geriatric Medicine | Primary: Geriatric Medicine

## 2020-10-27 ENCOUNTER — Institutional Professional Consult (permissible substitution): Admit: 2020-10-27 | Discharge: 2020-10-27 | Payer: MEDICARE

## 2020-10-27 ENCOUNTER — Ambulatory Visit: Admit: 2020-10-27 | Discharge: 2020-10-27 | Payer: MEDICARE

## 2020-10-27 DIAGNOSIS — R110 Nausea: Principal | ICD-10-CM

## 2020-10-27 DIAGNOSIS — C50811 Malignant neoplasm of overlapping sites of right female breast: Principal | ICD-10-CM

## 2020-10-27 DIAGNOSIS — T451X5A Adverse effect of antineoplastic and immunosuppressive drugs, initial encounter: Principal | ICD-10-CM

## 2020-10-27 DIAGNOSIS — F5101 Primary insomnia: Principal | ICD-10-CM

## 2020-10-27 DIAGNOSIS — G620 Drug-induced polyneuropathy: Secondary | ICD-10-CM

## 2020-10-27 DIAGNOSIS — K219 Gastro-esophageal reflux disease without esophagitis: Principal | ICD-10-CM

## 2020-10-27 DIAGNOSIS — K900 Celiac disease: Principal | ICD-10-CM

## 2020-10-27 DIAGNOSIS — C50919 Malignant neoplasm of unspecified site of unspecified female breast: Principal | ICD-10-CM

## 2020-10-27 DIAGNOSIS — Z5111 Encounter for antineoplastic chemotherapy: Principal | ICD-10-CM

## 2020-10-27 MED ORDER — PREGABALIN 200 MG CAPSULE
ORAL_CAPSULE | Freq: Two times a day (BID) | ORAL | 0 refills | 30 days | Status: CP
Start: 2020-10-27 — End: 2021-01-08

## 2020-10-27 MED ORDER — ESOMEPRAZOLE MAGNESIUM 40 MG CAPSULE,DELAYED RELEASE
ORAL_CAPSULE | Freq: Every day | ORAL | 3 refills | 90 days | Status: CP
Start: 2020-10-27 — End: ?

## 2020-10-27 MED ORDER — TRAZODONE 50 MG TABLET
ORAL_TABLET | Freq: Every evening | ORAL | 0 refills | 30 days | Status: CP
Start: 2020-10-27 — End: 2021-01-14

## 2020-11-02 DIAGNOSIS — E876 Hypokalemia: Principal | ICD-10-CM

## 2020-11-03 DIAGNOSIS — E876 Hypokalemia: Principal | ICD-10-CM

## 2020-11-05 MED ORDER — POTASSIUM CHLORIDE ER 10 MEQ TABLET,EXTENDED RELEASE
ORAL_TABLET | Freq: Every day | ORAL | 2 refills | 30 days | Status: CP
Start: 2020-11-05 — End: ?

## 2020-11-11 ENCOUNTER — Institutional Professional Consult (permissible substitution): Admit: 2020-11-11 | Discharge: 2020-11-12 | Payer: MEDICARE

## 2020-11-12 ENCOUNTER — Telehealth: Admit: 2020-11-12 | Discharge: 2020-11-13 | Payer: MEDICARE | Attending: Psychiatry | Primary: Psychiatry

## 2020-11-12 DIAGNOSIS — F419 Anxiety disorder, unspecified: Principal | ICD-10-CM

## 2020-11-12 DIAGNOSIS — F5101 Primary insomnia: Principal | ICD-10-CM

## 2020-11-12 DIAGNOSIS — G4739 Other sleep apnea: Principal | ICD-10-CM

## 2020-11-12 DIAGNOSIS — F32A Depression, unspecified depression type: Principal | ICD-10-CM

## 2020-11-12 MED ORDER — VENLAFAXINE ER 150 MG CAPSULE,EXTENDED RELEASE 24 HR
ORAL_CAPSULE | 2 refills | 0 days | Status: CP
Start: 2020-11-12 — End: ?

## 2020-11-12 MED ORDER — VENLAFAXINE ER 75 MG CAPSULE,EXTENDED RELEASE 24 HR
ORAL_CAPSULE | 2 refills | 0 days | Status: CP
Start: 2020-11-12 — End: ?

## 2020-11-13 ENCOUNTER — Telehealth: Admit: 2020-11-13 | Discharge: 2020-11-14 | Payer: MEDICARE

## 2020-11-13 DIAGNOSIS — M542 Cervicalgia: Principal | ICD-10-CM

## 2020-11-13 DIAGNOSIS — G8929 Other chronic pain: Principal | ICD-10-CM

## 2020-11-13 DIAGNOSIS — Z515 Encounter for palliative care: Principal | ICD-10-CM

## 2020-11-13 DIAGNOSIS — C50919 Malignant neoplasm of unspecified site of unspecified female breast: Principal | ICD-10-CM

## 2020-11-13 MED ORDER — HYDROCODONE 10 MG-ACETAMINOPHEN 325 MG TABLET
ORAL_TABLET | Freq: Three times a day (TID) | ORAL | 0 refills | 30 days | Status: CP | PRN
Start: 2020-11-13 — End: 2021-01-15

## 2020-11-17 ENCOUNTER — Ambulatory Visit: Admit: 2020-11-17 | Discharge: 2020-11-17 | Payer: MEDICARE

## 2020-11-17 ENCOUNTER — Telehealth: Admit: 2020-11-17 | Discharge: 2020-11-17 | Payer: MEDICARE | Attending: Registered" | Primary: Registered"

## 2020-11-17 DIAGNOSIS — M5030 Other cervical disc degeneration, unspecified cervical region: Principal | ICD-10-CM

## 2020-11-17 DIAGNOSIS — Z5111 Encounter for antineoplastic chemotherapy: Principal | ICD-10-CM

## 2020-11-17 DIAGNOSIS — C7931 Secondary malignant neoplasm of brain: Principal | ICD-10-CM

## 2020-11-17 DIAGNOSIS — E785 Hyperlipidemia, unspecified: Principal | ICD-10-CM

## 2020-11-17 DIAGNOSIS — C50919 Malignant neoplasm of unspecified site of unspecified female breast: Principal | ICD-10-CM

## 2020-11-17 DIAGNOSIS — Z720 Tobacco use: Principal | ICD-10-CM

## 2020-11-17 DIAGNOSIS — R110 Nausea: Principal | ICD-10-CM

## 2020-11-17 DIAGNOSIS — M549 Dorsalgia, unspecified: Principal | ICD-10-CM

## 2020-11-17 DIAGNOSIS — C50811 Malignant neoplasm of overlapping sites of right female breast: Principal | ICD-10-CM

## 2020-11-17 DIAGNOSIS — F3341 Major depressive disorder, recurrent, in partial remission: Principal | ICD-10-CM

## 2020-11-17 DIAGNOSIS — K900 Celiac disease: Principal | ICD-10-CM

## 2020-11-17 DIAGNOSIS — I10 Essential (primary) hypertension: Principal | ICD-10-CM

## 2020-11-24 MED ORDER — CLONAZEPAM 0.5 MG TABLET
ORAL_TABLET | 0 refills | 0 days | Status: CP
Start: 2020-11-24 — End: 2021-01-14

## 2020-11-30 ENCOUNTER — Ambulatory Visit: Admit: 2020-11-30 | Discharge: 2020-11-30 | Discharge status: Against Medical Advice | Payer: MEDICARE

## 2020-11-30 DIAGNOSIS — R509 Fever, unspecified: Principal | ICD-10-CM

## 2020-11-30 DIAGNOSIS — Z5321 Procedure and treatment not carried out due to patient leaving prior to being seen by health care provider: Principal | ICD-10-CM

## 2020-11-30 DIAGNOSIS — R059 Cough, unspecified: Principal | ICD-10-CM

## 2020-11-30 DIAGNOSIS — Z20822 Contact with and (suspected) exposure to covid-19: Principal | ICD-10-CM

## 2020-12-04 ENCOUNTER — Ambulatory Visit: Admit: 2020-12-04 | Discharge: 2020-12-04 | Payer: MEDICARE

## 2020-12-04 ENCOUNTER — Ambulatory Visit
Admit: 2020-12-04 | Discharge: 2020-12-25 | Payer: MEDICARE | Attending: Radiation Oncology | Primary: Radiation Oncology

## 2020-12-04 DIAGNOSIS — C7931 Secondary malignant neoplasm of brain: Principal | ICD-10-CM

## 2020-12-04 DIAGNOSIS — E785 Hyperlipidemia, unspecified: Principal | ICD-10-CM

## 2020-12-04 DIAGNOSIS — T451X5A Adverse effect of antineoplastic and immunosuppressive drugs, initial encounter: Principal | ICD-10-CM

## 2020-12-04 DIAGNOSIS — R748 Abnormal levels of other serum enzymes: Principal | ICD-10-CM

## 2020-12-04 DIAGNOSIS — D6481 Anemia due to antineoplastic chemotherapy: Principal | ICD-10-CM

## 2020-12-04 DIAGNOSIS — I10 Essential (primary) hypertension: Principal | ICD-10-CM

## 2020-12-04 DIAGNOSIS — Z9221 Personal history of antineoplastic chemotherapy: Principal | ICD-10-CM

## 2020-12-04 DIAGNOSIS — R197 Diarrhea, unspecified: Principal | ICD-10-CM

## 2020-12-04 DIAGNOSIS — K900 Celiac disease: Principal | ICD-10-CM

## 2020-12-04 DIAGNOSIS — K219 Gastro-esophageal reflux disease without esophagitis: Principal | ICD-10-CM

## 2020-12-04 DIAGNOSIS — G4700 Insomnia, unspecified: Principal | ICD-10-CM

## 2020-12-04 DIAGNOSIS — G629 Polyneuropathy, unspecified: Principal | ICD-10-CM

## 2020-12-04 DIAGNOSIS — G43009 Migraine without aura, not intractable, without status migrainosus: Principal | ICD-10-CM

## 2020-12-04 DIAGNOSIS — S32501A Unspecified fracture of right pubis, initial encounter for closed fracture: Principal | ICD-10-CM

## 2020-12-04 DIAGNOSIS — Z170 Estrogen receptor positive status [ER+]: Principal | ICD-10-CM

## 2020-12-04 DIAGNOSIS — C50811 Malignant neoplasm of overlapping sites of right female breast: Principal | ICD-10-CM

## 2020-12-04 DIAGNOSIS — Z515 Encounter for palliative care: Principal | ICD-10-CM

## 2020-12-04 DIAGNOSIS — I209 Angina pectoris, unspecified: Principal | ICD-10-CM

## 2020-12-04 DIAGNOSIS — M545 Low back pain, unspecified: Principal | ICD-10-CM

## 2020-12-04 DIAGNOSIS — F419 Anxiety disorder, unspecified: Principal | ICD-10-CM

## 2020-12-04 DIAGNOSIS — Z79891 Long term (current) use of opiate analgesic: Principal | ICD-10-CM

## 2020-12-04 DIAGNOSIS — M5030 Other cervical disc degeneration, unspecified cervical region: Principal | ICD-10-CM

## 2020-12-04 DIAGNOSIS — E871 Hypo-osmolality and hyponatremia: Principal | ICD-10-CM

## 2020-12-04 DIAGNOSIS — G939 Disorder of brain, unspecified: Principal | ICD-10-CM

## 2020-12-04 DIAGNOSIS — J984 Other disorders of lung: Principal | ICD-10-CM

## 2020-12-04 DIAGNOSIS — F17200 Nicotine dependence, unspecified, uncomplicated: Principal | ICD-10-CM

## 2020-12-04 DIAGNOSIS — M546 Pain in thoracic spine: Principal | ICD-10-CM

## 2020-12-04 DIAGNOSIS — C50919 Malignant neoplasm of unspecified site of unspecified female breast: Principal | ICD-10-CM

## 2020-12-04 DIAGNOSIS — Z885 Allergy status to narcotic agent status: Principal | ICD-10-CM

## 2020-12-04 DIAGNOSIS — C399 Malignant neoplasm of lower respiratory tract, part unspecified: Principal | ICD-10-CM

## 2020-12-04 MED ORDER — IBUPROFEN 600 MG TABLET
ORAL_TABLET | Freq: Four times a day (QID) | ORAL | 0 refills | 23 days | Status: CP | PRN
Start: 2020-12-04 — End: 2021-01-08

## 2020-12-04 MED ORDER — DICLOFENAC 1 % TOPICAL GEL
Freq: Four times a day (QID) | TOPICAL | 2 refills | 19.00000 days | Status: CP | PRN
Start: 2020-12-04 — End: 2020-12-09

## 2020-12-08 ENCOUNTER — Ambulatory Visit: Admit: 2020-12-08 | Discharge: 2020-12-21 | Payer: MEDICARE

## 2020-12-08 ENCOUNTER — Institutional Professional Consult (permissible substitution): Admit: 2020-12-08 | Discharge: 2020-12-21 | Payer: MEDICARE

## 2020-12-08 ENCOUNTER — Ambulatory Visit
Admit: 2020-12-08 | Discharge: 2020-12-21 | Payer: MEDICARE | Attending: Geriatric Medicine | Primary: Geriatric Medicine

## 2020-12-08 DIAGNOSIS — C50811 Malignant neoplasm of overlapping sites of right female breast: Principal | ICD-10-CM

## 2020-12-08 DIAGNOSIS — K900 Celiac disease: Principal | ICD-10-CM

## 2020-12-08 DIAGNOSIS — R110 Nausea: Principal | ICD-10-CM

## 2020-12-08 DIAGNOSIS — C50919 Malignant neoplasm of unspecified site of unspecified female breast: Principal | ICD-10-CM

## 2020-12-09 DIAGNOSIS — M546 Pain in thoracic spine: Principal | ICD-10-CM

## 2020-12-09 MED ORDER — DICLOFENAC 1 % TOPICAL GEL
Freq: Four times a day (QID) | TOPICAL | 2 refills | 19 days | Status: CP | PRN
Start: 2020-12-09 — End: ?

## 2020-12-11 ENCOUNTER — Ambulatory Visit: Admit: 2020-12-11 | Discharge: 2020-12-11 | Payer: MEDICARE

## 2020-12-11 ENCOUNTER — Ambulatory Visit: Admit: 2020-12-11 | Discharge: 2020-12-11 | Payer: MEDICARE | Attending: Pulmonary Disease | Primary: Pulmonary Disease

## 2020-12-11 ENCOUNTER — Telehealth
Admit: 2020-12-11 | Discharge: 2020-12-11 | Payer: MEDICARE | Attending: Geriatric Medicine | Primary: Geriatric Medicine

## 2020-12-11 DIAGNOSIS — Z885 Allergy status to narcotic agent status: Principal | ICD-10-CM

## 2020-12-11 DIAGNOSIS — Z79899 Other long term (current) drug therapy: Principal | ICD-10-CM

## 2020-12-11 DIAGNOSIS — R0600 Dyspnea, unspecified: Principal | ICD-10-CM

## 2020-12-11 DIAGNOSIS — M549 Dorsalgia, unspecified: Principal | ICD-10-CM

## 2020-12-11 DIAGNOSIS — K900 Celiac disease: Principal | ICD-10-CM

## 2020-12-11 DIAGNOSIS — Z79891 Long term (current) use of opiate analgesic: Principal | ICD-10-CM

## 2020-12-11 DIAGNOSIS — G629 Polyneuropathy, unspecified: Principal | ICD-10-CM

## 2020-12-11 DIAGNOSIS — C50919 Malignant neoplasm of unspecified site of unspecified female breast: Principal | ICD-10-CM

## 2020-12-11 DIAGNOSIS — R197 Diarrhea, unspecified: Principal | ICD-10-CM

## 2020-12-11 DIAGNOSIS — G4700 Insomnia, unspecified: Principal | ICD-10-CM

## 2020-12-11 DIAGNOSIS — Z923 Personal history of irradiation: Principal | ICD-10-CM

## 2020-12-11 DIAGNOSIS — K5900 Constipation, unspecified: Principal | ICD-10-CM

## 2020-12-11 DIAGNOSIS — F17210 Nicotine dependence, cigarettes, uncomplicated: Principal | ICD-10-CM

## 2020-12-11 DIAGNOSIS — Z515 Encounter for palliative care: Principal | ICD-10-CM

## 2020-12-11 DIAGNOSIS — Z8249 Family history of ischemic heart disease and other diseases of the circulatory system: Principal | ICD-10-CM

## 2020-12-11 DIAGNOSIS — Z5941 Food insecurity: Principal | ICD-10-CM

## 2020-12-11 DIAGNOSIS — Z9049 Acquired absence of other specified parts of digestive tract: Principal | ICD-10-CM

## 2020-12-11 DIAGNOSIS — T451X5A Adverse effect of antineoplastic and immunosuppressive drugs, initial encounter: Principal | ICD-10-CM

## 2020-12-11 DIAGNOSIS — Z791 Long term (current) use of non-steroidal anti-inflammatories (NSAID): Principal | ICD-10-CM

## 2020-12-11 DIAGNOSIS — J329 Chronic sinusitis, unspecified: Principal | ICD-10-CM

## 2020-12-11 DIAGNOSIS — J449 Chronic obstructive pulmonary disease, unspecified: Principal | ICD-10-CM

## 2020-12-11 DIAGNOSIS — M1990 Unspecified osteoarthritis, unspecified site: Principal | ICD-10-CM

## 2020-12-11 DIAGNOSIS — Z5111 Encounter for antineoplastic chemotherapy: Principal | ICD-10-CM

## 2020-12-11 DIAGNOSIS — D649 Anemia, unspecified: Principal | ICD-10-CM

## 2020-12-11 DIAGNOSIS — C399 Malignant neoplasm of lower respiratory tract, part unspecified: Principal | ICD-10-CM

## 2020-12-11 DIAGNOSIS — Z171 Estrogen receptor negative status [ER-]: Principal | ICD-10-CM

## 2020-12-11 DIAGNOSIS — F419 Anxiety disorder, unspecified: Principal | ICD-10-CM

## 2020-12-11 DIAGNOSIS — C7931 Secondary malignant neoplasm of brain: Principal | ICD-10-CM

## 2020-12-11 DIAGNOSIS — D500 Iron deficiency anemia secondary to blood loss (chronic): Principal | ICD-10-CM

## 2020-12-11 DIAGNOSIS — K219 Gastro-esophageal reflux disease without esophagitis: Principal | ICD-10-CM

## 2020-12-11 DIAGNOSIS — E785 Hyperlipidemia, unspecified: Principal | ICD-10-CM

## 2020-12-11 DIAGNOSIS — M2550 Pain in unspecified joint: Principal | ICD-10-CM

## 2020-12-11 DIAGNOSIS — C50811 Malignant neoplasm of overlapping sites of right female breast: Principal | ICD-10-CM

## 2020-12-11 DIAGNOSIS — R590 Localized enlarged lymph nodes: Principal | ICD-10-CM

## 2020-12-11 DIAGNOSIS — R110 Nausea: Principal | ICD-10-CM

## 2020-12-11 DIAGNOSIS — R5383 Other fatigue: Principal | ICD-10-CM

## 2020-12-11 DIAGNOSIS — G620 Drug-induced polyneuropathy: Principal | ICD-10-CM

## 2020-12-29 ENCOUNTER — Ambulatory Visit: Admit: 2020-12-29 | Discharge: 2020-12-30 | Payer: MEDICARE

## 2020-12-29 DIAGNOSIS — Z171 Estrogen receptor negative status [ER-]: Principal | ICD-10-CM

## 2020-12-29 DIAGNOSIS — K900 Celiac disease: Principal | ICD-10-CM

## 2020-12-29 DIAGNOSIS — R110 Nausea: Principal | ICD-10-CM

## 2020-12-29 DIAGNOSIS — R197 Diarrhea, unspecified: Principal | ICD-10-CM

## 2020-12-29 DIAGNOSIS — D500 Iron deficiency anemia secondary to blood loss (chronic): Principal | ICD-10-CM

## 2020-12-29 DIAGNOSIS — C50811 Malignant neoplasm of overlapping sites of right female breast: Principal | ICD-10-CM

## 2020-12-29 DIAGNOSIS — Z5112 Encounter for antineoplastic immunotherapy: Principal | ICD-10-CM

## 2020-12-29 DIAGNOSIS — C50919 Malignant neoplasm of unspecified site of unspecified female breast: Principal | ICD-10-CM

## 2020-12-30 ENCOUNTER — Ambulatory Visit: Admit: 2020-12-30 | Discharge: 2020-12-31 | Payer: MEDICARE

## 2020-12-30 DIAGNOSIS — R0600 Dyspnea, unspecified: Principal | ICD-10-CM

## 2021-01-08 ENCOUNTER — Institutional Professional Consult (permissible substitution): Admit: 2021-01-08 | Discharge: 2021-01-09 | Payer: MEDICARE

## 2021-01-08 DIAGNOSIS — G8929 Other chronic pain: Principal | ICD-10-CM

## 2021-01-08 DIAGNOSIS — M549 Dorsalgia, unspecified: Principal | ICD-10-CM

## 2021-01-08 DIAGNOSIS — G620 Drug-induced polyneuropathy: Principal | ICD-10-CM

## 2021-01-08 DIAGNOSIS — T451X5A Adverse effect of antineoplastic and immunosuppressive drugs, initial encounter: Principal | ICD-10-CM

## 2021-01-08 DIAGNOSIS — C50919 Malignant neoplasm of unspecified site of unspecified female breast: Principal | ICD-10-CM

## 2021-01-08 DIAGNOSIS — Z515 Encounter for palliative care: Principal | ICD-10-CM

## 2021-01-08 MED ORDER — PREGABALIN 200 MG CAPSULE
ORAL_CAPSULE | Freq: Two times a day (BID) | ORAL | 2 refills | 30 days | Status: CP
Start: 2021-01-08 — End: ?

## 2021-01-14 ENCOUNTER — Telehealth: Admit: 2021-01-14 | Discharge: 2021-01-15 | Payer: MEDICARE | Attending: Psychiatry | Primary: Psychiatry

## 2021-01-14 DIAGNOSIS — F5101 Primary insomnia: Principal | ICD-10-CM

## 2021-01-14 MED ORDER — CLONAZEPAM 0.5 MG TABLET
ORAL_TABLET | 0 refills | 0 days | Status: CP
Start: 2021-01-14 — End: ?

## 2021-01-14 MED ORDER — TRAZODONE 50 MG TABLET
ORAL_TABLET | Freq: Every evening | ORAL | 1 refills | 30 days | Status: CP
Start: 2021-01-14 — End: ?

## 2021-01-15 DIAGNOSIS — M549 Dorsalgia, unspecified: Principal | ICD-10-CM

## 2021-01-15 MED ORDER — TRAMADOL 50 MG TABLET
ORAL_TABLET | Freq: Three times a day (TID) | ORAL | 0 refills | 7.00000 days | Status: CP | PRN
Start: 2021-01-15 — End: ?

## 2021-01-19 ENCOUNTER — Ambulatory Visit: Admit: 2021-01-19 | Discharge: 2021-01-20 | Payer: MEDICARE

## 2021-01-19 ENCOUNTER — Institutional Professional Consult (permissible substitution): Admit: 2021-01-19 | Discharge: 2021-01-20 | Payer: MEDICARE

## 2021-01-19 ENCOUNTER — Ambulatory Visit
Admit: 2021-01-19 | Discharge: 2021-01-20 | Payer: MEDICARE | Attending: Geriatric Medicine | Primary: Geriatric Medicine

## 2021-01-19 DIAGNOSIS — R110 Nausea: Principal | ICD-10-CM

## 2021-01-19 DIAGNOSIS — Z5112 Encounter for antineoplastic immunotherapy: Principal | ICD-10-CM

## 2021-01-19 DIAGNOSIS — T451X5A Adverse effect of antineoplastic and immunosuppressive drugs, initial encounter: Principal | ICD-10-CM

## 2021-01-19 DIAGNOSIS — D500 Iron deficiency anemia secondary to blood loss (chronic): Principal | ICD-10-CM

## 2021-01-19 DIAGNOSIS — C50919 Malignant neoplasm of unspecified site of unspecified female breast: Principal | ICD-10-CM

## 2021-01-19 DIAGNOSIS — K900 Celiac disease: Principal | ICD-10-CM

## 2021-01-19 DIAGNOSIS — C7802 Secondary malignant neoplasm of left lung: Principal | ICD-10-CM

## 2021-01-19 DIAGNOSIS — C50811 Malignant neoplasm of overlapping sites of right female breast: Principal | ICD-10-CM

## 2021-01-19 DIAGNOSIS — G620 Drug-induced polyneuropathy: Principal | ICD-10-CM

## 2021-01-19 DIAGNOSIS — C7931 Secondary malignant neoplasm of brain: Principal | ICD-10-CM

## 2021-01-19 DIAGNOSIS — Z171 Estrogen receptor negative status [ER-]: Principal | ICD-10-CM

## 2021-01-28 ENCOUNTER — Ambulatory Visit
Admit: 2021-01-28 | Discharge: 2021-01-29 | Payer: MEDICARE | Attending: Physical Medicine & Rehabilitation | Primary: Physical Medicine & Rehabilitation

## 2021-01-28 ENCOUNTER — Ambulatory Visit: Admit: 2021-01-28 | Discharge: 2021-01-29 | Payer: MEDICARE

## 2021-01-28 ENCOUNTER — Ambulatory Visit
Admit: 2021-01-28 | Discharge: 2021-01-29 | Payer: MEDICARE | Attending: Student in an Organized Health Care Education/Training Program | Primary: Student in an Organized Health Care Education/Training Program

## 2021-01-28 DIAGNOSIS — Z5941 Food insecurity: Principal | ICD-10-CM

## 2021-01-28 DIAGNOSIS — I10 Essential (primary) hypertension: Principal | ICD-10-CM

## 2021-01-28 DIAGNOSIS — G4700 Insomnia, unspecified: Principal | ICD-10-CM

## 2021-01-28 DIAGNOSIS — Z8249 Family history of ischemic heart disease and other diseases of the circulatory system: Principal | ICD-10-CM

## 2021-01-28 DIAGNOSIS — R21 Rash and other nonspecific skin eruption: Principal | ICD-10-CM

## 2021-01-28 DIAGNOSIS — J449 Chronic obstructive pulmonary disease, unspecified: Principal | ICD-10-CM

## 2021-01-28 DIAGNOSIS — K760 Fatty (change of) liver, not elsewhere classified: Principal | ICD-10-CM

## 2021-01-28 DIAGNOSIS — L539 Erythematous condition, unspecified: Principal | ICD-10-CM

## 2021-01-28 DIAGNOSIS — Z801 Family history of malignant neoplasm of trachea, bronchus and lung: Principal | ICD-10-CM

## 2021-01-28 DIAGNOSIS — M47812 Spondylosis without myelopathy or radiculopathy, cervical region: Principal | ICD-10-CM

## 2021-01-28 DIAGNOSIS — M7912 Myalgia of auxiliary muscles, head and neck: Principal | ICD-10-CM

## 2021-01-28 DIAGNOSIS — E785 Hyperlipidemia, unspecified: Principal | ICD-10-CM

## 2021-01-28 DIAGNOSIS — F17210 Nicotine dependence, cigarettes, uncomplicated: Principal | ICD-10-CM

## 2021-01-28 DIAGNOSIS — Z885 Allergy status to narcotic agent status: Principal | ICD-10-CM

## 2021-01-28 DIAGNOSIS — Z803 Family history of malignant neoplasm of breast: Principal | ICD-10-CM

## 2021-01-28 DIAGNOSIS — Z85841 Personal history of malignant neoplasm of brain: Principal | ICD-10-CM

## 2021-01-28 DIAGNOSIS — Z853 Personal history of malignant neoplasm of breast: Principal | ICD-10-CM

## 2021-01-28 DIAGNOSIS — R17 Unspecified jaundice: Principal | ICD-10-CM

## 2021-01-28 DIAGNOSIS — M9961 Osseous and subluxation stenosis of intervertebral foramina of cervical region: Principal | ICD-10-CM

## 2021-01-28 DIAGNOSIS — I781 Nevus, non-neoplastic: Principal | ICD-10-CM

## 2021-01-28 DIAGNOSIS — C50811 Malignant neoplasm of overlapping sites of right female breast: Principal | ICD-10-CM

## 2021-01-28 MED ORDER — CLOBETASOL 0.05 % TOPICAL OINTMENT
OPHTHALMIC | 1 refills | 0.00000 days | Status: CP
Start: 2021-01-28 — End: ?

## 2021-01-29 DIAGNOSIS — K7689 Other specified diseases of liver: Principal | ICD-10-CM

## 2021-02-02 DIAGNOSIS — R0600 Dyspnea, unspecified: Principal | ICD-10-CM

## 2021-02-02 DIAGNOSIS — E876 Hypokalemia: Principal | ICD-10-CM

## 2021-02-03 ENCOUNTER — Ambulatory Visit: Admit: 2021-02-03 | Discharge: 2021-02-04 | Payer: MEDICARE

## 2021-02-03 ENCOUNTER — Ambulatory Visit: Admit: 2021-02-03 | Discharge: 2021-02-04 | Payer: MEDICARE | Attending: Psychiatry | Primary: Psychiatry

## 2021-02-03 DIAGNOSIS — G3184 Mild cognitive impairment, so stated: Principal | ICD-10-CM

## 2021-02-03 DIAGNOSIS — R0600 Dyspnea, unspecified: Principal | ICD-10-CM

## 2021-02-09 ENCOUNTER — Ambulatory Visit: Admit: 2021-02-09 | Discharge: 2021-02-10 | Payer: MEDICARE

## 2021-02-09 DIAGNOSIS — C50811 Malignant neoplasm of overlapping sites of right female breast: Principal | ICD-10-CM

## 2021-02-09 DIAGNOSIS — C50919 Malignant neoplasm of unspecified site of unspecified female breast: Principal | ICD-10-CM

## 2021-02-09 DIAGNOSIS — R110 Nausea: Principal | ICD-10-CM

## 2021-02-09 DIAGNOSIS — Z5112 Encounter for antineoplastic immunotherapy: Principal | ICD-10-CM

## 2021-02-09 DIAGNOSIS — K900 Celiac disease: Principal | ICD-10-CM

## 2021-02-13 DIAGNOSIS — E876 Hypokalemia: Principal | ICD-10-CM

## 2021-02-16 DIAGNOSIS — E876 Hypokalemia: Principal | ICD-10-CM

## 2021-02-17 DIAGNOSIS — E876 Hypokalemia: Principal | ICD-10-CM

## 2021-02-17 MED ORDER — POTASSIUM CHLORIDE ER 10 MEQ TABLET,EXTENDED RELEASE
ORAL_TABLET | Freq: Every day | ORAL | 2 refills | 30 days | Status: CP
Start: 2021-02-17 — End: ?

## 2021-02-23 ENCOUNTER — Ambulatory Visit
Admit: 2021-02-23 | Discharge: 2021-02-24 | Payer: MEDICARE | Attending: Student in an Organized Health Care Education/Training Program | Primary: Student in an Organized Health Care Education/Training Program

## 2021-02-23 ENCOUNTER — Ambulatory Visit: Admit: 2021-02-23 | Discharge: 2021-02-24 | Payer: MEDICARE

## 2021-02-23 DIAGNOSIS — H269 Unspecified cataract: Principal | ICD-10-CM

## 2021-02-23 DIAGNOSIS — H02883 Meibomian gland dysfunction of right eye, unspecified eyelid: Principal | ICD-10-CM

## 2021-02-23 DIAGNOSIS — H04123 Dry eye syndrome of bilateral lacrimal glands: Principal | ICD-10-CM

## 2021-02-23 DIAGNOSIS — H40003 Preglaucoma, unspecified, bilateral: Principal | ICD-10-CM

## 2021-02-23 DIAGNOSIS — H02886 Meibomian gland dysfunction of left eye, unspecified eyelid: Principal | ICD-10-CM

## 2021-02-23 DIAGNOSIS — C7931 Secondary malignant neoplasm of brain: Principal | ICD-10-CM

## 2021-03-02 ENCOUNTER — Ambulatory Visit: Admit: 2021-03-02 | Discharge: 2021-03-03 | Payer: MEDICARE

## 2021-03-02 DIAGNOSIS — K900 Celiac disease: Principal | ICD-10-CM

## 2021-03-02 DIAGNOSIS — C50919 Malignant neoplasm of unspecified site of unspecified female breast: Principal | ICD-10-CM

## 2021-03-02 DIAGNOSIS — R110 Nausea: Principal | ICD-10-CM

## 2021-03-02 DIAGNOSIS — C50811 Malignant neoplasm of overlapping sites of right female breast: Principal | ICD-10-CM

## 2021-03-08 ENCOUNTER — Ambulatory Visit: Admit: 2021-03-08 | Discharge: 2021-03-08 | Payer: MEDICARE

## 2021-03-08 ENCOUNTER — Ambulatory Visit
Admit: 2021-03-08 | Discharge: 2021-03-08 | Payer: MEDICARE | Attending: Student in an Organized Health Care Education/Training Program | Primary: Student in an Organized Health Care Education/Training Program

## 2021-03-08 DIAGNOSIS — K900 Celiac disease: Principal | ICD-10-CM

## 2021-03-08 DIAGNOSIS — C50919 Malignant neoplasm of unspecified site of unspecified female breast: Principal | ICD-10-CM

## 2021-03-08 DIAGNOSIS — R110 Nausea: Principal | ICD-10-CM

## 2021-03-08 DIAGNOSIS — D696 Thrombocytopenia, unspecified: Principal | ICD-10-CM

## 2021-03-08 DIAGNOSIS — C50811 Malignant neoplasm of overlapping sites of right female breast: Principal | ICD-10-CM

## 2021-03-08 DIAGNOSIS — R748 Abnormal levels of other serum enzymes: Principal | ICD-10-CM

## 2021-03-08 MED ORDER — DORZOLAMIDE 2 % EYE DROPS
Freq: Three times a day (TID) | OPHTHALMIC | 12 refills | 67.00000 days | Status: CP
Start: 2021-03-08 — End: 2022-03-08

## 2021-03-10 ENCOUNTER — Ambulatory Visit: Admit: 2021-03-10 | Discharge: 2021-03-10 | Payer: MEDICARE

## 2021-03-10 ENCOUNTER — Telehealth: Admit: 2021-03-10 | Discharge: 2021-03-10 | Payer: MEDICARE

## 2021-03-10 ENCOUNTER — Ambulatory Visit
Admit: 2021-03-10 | Discharge: 2021-03-25 | Payer: MEDICARE | Attending: Radiation Oncology | Primary: Radiation Oncology

## 2021-03-10 MED ORDER — PREGABALIN 200 MG CAPSULE
ORAL_CAPSULE | Freq: Two times a day (BID) | ORAL | 2 refills | 30 days | Status: CP
Start: 2021-03-10 — End: ?

## 2021-03-10 NOTE — Unmapped (Signed)
Radiation Oncology Telemedicine Follow Up Visit    Encounter Date: 03/10/2021  Patient Name: Brooke [name]  Medical Record Number: [id number]  Patient identity verbally confirmed: Yes.  Verbal consent for telemedicine encounter: Yes.  Location of patient:  [facility]  [address]  Sofie Rower [place] [address]   Location of provider [place], State): [place], [place]  Encounter medium: audio  Software platform: Telephone  Attendees (and relation to patient): patient and provider  Total time: 25 minutes  Janell Quiet, [place]  March 10, 2021   3:32 PM      Interval Since Completion of RT Treatment:  2 years from most recent CK to recurrent right frontal lesion  3.5 years from initial CK to 7 lesions    Assessment/Plan:  Brooke [name] is a 65 y.o. female with metastatic breast cancer, ER-/PR+/Her2+, s/p CK to 7 lesions in September 2018, 1 lesion in 08/2018, resection of a right frontal mass  (01/15/2019) which was previously irradiated but with pathology showing progressive HER2+ breast cancer, completed 25 Gy in 5 fx to right frontal post-op bed on 02/28/2019.  Currently off of herceptin x 1 month due to low platelet counts.    Intracranial disease: MRI Brain 03/10/21 shows stable findings without new lesions.  Extracranial disease: CT CAP 01/28/21 demonstrated stability of pulm nodules but was concerning for liver lesion vs focal fat; MRI ab scheduled for 03/22/21.  --Brain mets: Follow up with repeat MRI in 3 months (phone visit after), with a plan to start spacing out visits at that point if MRI remains stable.  --Systemic therapy per Dr [name]     INTERVAL HISTORY:    She's concerned about spot on liver. 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  She's been off chemo x 1 month and feel like she is about to take some steps backwards instead of forwards. Having a lot of anxiety about this.    She spends her time cooking all day.  She has been making one dish from each country she could think of--indian food x 2, [place] food, [place] and [place] and all-american cooking.  Her tastes change every so often over the last five years since she has been getting chemo.       REVIEW OF SYSTEMS:  A comprehensive review of 10 systems was negative except for pertinent positives noted in HPI.      Patient Active Problem List   Diagnosis   ??? Malignant neoplasm of overlapping sites of right female breast (CMS-HCC)   ??? Peripheral neuropathy   ??? Insomnia   ??? Brain metastases (CMS-HCC)   ??? Nausea   ??? Closed fracture of one rib with routine healing   ??? Diarrhea of presumed infectious origin   ??? Failure to thrive in adult   ??? Tobacco use   ??? Chronic diarrhea   ??? Depression   ??? Hyponatremia   ??? Back pain   ??? Closed fracture of multiple pubic rami, right, initial encounter (CMS-HCC)   ??? Macrocytosis   ??? Pelvic fracture (CMS-HCC)   ??? Metastatic breast cancer (CMS-HCC)   ??? Migraine without aura and without status migrainosus, not intractable   ??? Essential hypertension   ??? CVA (cerebral vascular accident) (CMS-HCC)   ??? DDD (degenerative disc disease), cervical   ??? Wheezing   ??? Dyslipidemia   ??? Angina pectoris (CMS-HCC)   ??? Papillary fibroelastoma of heart   ??? Antineoplastic chemotherapy induced anemia   ??? Celiac disease   ???  Iron deficiency anemia due to chronic blood loss   ??? Dry eye syndrome, bilateral   ??? Meibomian gland dysfunction (MGD) of both eyes   ??? Glaucoma suspect of both eyes   ??? Incipient cataract of both eyes       PAST MEDICAL HISTORY/FAMILY HISTORY/SOCIAL HISTORY:  Reviewed in EPIC    ALLERGIES/MEDICATIONS:  Reviewed in EPIC      OBJECTIVE:  Karnofsky/Lansky Performance Status: 80, Normal activity with effort; some signs or symptoms of disease (ECOG equivalent 1)  Vitals: Not performed/obtained  General: No acute distress, alert and oriented  Pulm: No increased work of breathing, speaking in full sentences  Neuro:  Speech clear/fluent, no expressive aphasia, comprehension full  Psych: Normal mood and affect          The patient reports they are currently: at home. I spent 18 minutes on the phone with the patient on the date of service. I spent an additional 5 minutes on pre- and post-visit activities on the date of service.     The patient was physically located in [place] or a state in which I am permitted to provide care. The patient and/or parent/guardian understood that s/he may incur co-pays and cost sharing, and agreed to the telemedicine visit. The visit was reasonable and appropriate under the circumstances given the patient's presentation at the time.    The patient and/or parent/guardian has been advised of the potential risks and limitations of this mode of treatment (including, but not limited to, the absence of in-person examination) and has agreed to be treated using telemedicine. The patient's/patient's family's questions regarding telemedicine have been answered.     If the visit was completed in an ambulatory setting, the patient and/or parent/guardian has also been advised to contact their provider???s office for worsening conditions, and seek emergency medical treatment and/or call 911 if the patient deems either necessary.             Sydnee Levans, PA-C  Department of Radiation Oncology  [facility]  [address], [address]  [place], [place] [address]  O: [phone]  March 10, 2021 3:32 PM

## 2021-03-11 ENCOUNTER — Telehealth: Admit: 2021-03-11 | Discharge: 2021-03-12 | Payer: MEDICARE | Attending: Psychiatry | Primary: Psychiatry

## 2021-03-11 MED ORDER — VENLAFAXINE ER 150 MG CAPSULE,EXTENDED RELEASE 24 HR
ORAL_CAPSULE | 2 refills | 0 days | Status: CP
Start: 2021-03-11 — End: ?

## 2021-03-11 MED ORDER — VENLAFAXINE ER 75 MG CAPSULE,EXTENDED RELEASE 24 HR
ORAL_CAPSULE | 2 refills | 0 days | Status: CP
Start: 2021-03-11 — End: ?

## 2021-03-11 MED ORDER — TRAZODONE 50 MG TABLET
ORAL_TABLET | Freq: Every evening | ORAL | 1 refills | 30 days | Status: CP
Start: 2021-03-11 — End: ?

## 2021-03-16 ENCOUNTER — Ambulatory Visit
Admit: 2021-03-16 | Discharge: 2021-03-16 | Payer: MEDICARE | Attending: Geriatric Medicine | Primary: Geriatric Medicine

## 2021-03-16 ENCOUNTER — Institutional Professional Consult (permissible substitution): Admit: 2021-03-16 | Discharge: 2021-03-16 | Payer: MEDICARE

## 2021-03-16 ENCOUNTER — Ambulatory Visit: Admit: 2021-03-16 | Discharge: 2021-03-16 | Payer: MEDICARE | Attending: Pulmonary Disease | Primary: Pulmonary Disease

## 2021-03-16 DIAGNOSIS — C50919 Malignant neoplasm of unspecified site of unspecified female breast: Principal | ICD-10-CM

## 2021-03-16 DIAGNOSIS — C7931 Secondary malignant neoplasm of brain: Principal | ICD-10-CM

## 2021-03-16 DIAGNOSIS — C50811 Malignant neoplasm of overlapping sites of right female breast: Principal | ICD-10-CM

## 2021-03-16 MED ORDER — CAPECITABINE 500 MG TABLET
ORAL_TABLET | Freq: Two times a day (BID) | ORAL | 3 refills | 14.00000 days | Status: CP
Start: 2021-03-16 — End: ?
  Filled 2021-04-09: qty 84, 21d supply, fill #0

## 2021-03-16 MED ORDER — TUCATINIB 150 MG TABLET
ORAL_TABLET | Freq: Two times a day (BID) | ORAL | 2 refills | 15 days | Status: CP
Start: 2021-03-16 — End: ?

## 2021-03-16 NOTE — Unmapped (Addendum)
[facility] at [facility]          Initial  Pulmonary Visit    Referring Physician :  [name]  PCP:     [name], MD  Reason for Consult:   Evaluation of dyspnea    HISTORY:     History of Present Illness:  Brooke [name] is a 65 y.o. female with a history of GERD, depression, HTN, dyslipidemia, and metastatic HER2+ breast cancer (s/p radiotherapy and chemotherapy with Trastuzumab/Pertuzumab)  whom we are seeing in consultation requested by [name] for evaluation of shortness of breath.    HPI  Patient was diagnosed with metastatic breast cancer in 2018 and was treated with radio- and chemotherapy (Trastuzumab/Pertuzumab) and underwent brain lesion resection. She states that she started having shortness of breath with exertion approximately a year later to starting therapy for her breast cancer. Dyspnea is only exertional and she is able to walk only 1/2 a city block before she has to stop to catch her breath. She denies orthopnea but reports occasional non-productive cough. No hemoptysis, fever, night sweats or unintentional weight loss. Also denies LE swelling, dizziness, lightheadedness, or syncope. Also reports daily chest pain at the site of the port-A-cath for several months. She underwent a LHC thatt was negative for coronary disease (LVEDP was 9). Notably, her dyspnea had fluctuated and most recently she feels that it has improved.    She endorses fatigue, occasional wheezing (esp at night), significant sinus symptoms (sinus congestion, postnasal drip) and heartburn. She previously used SABA with some benefit.     She was born full term and had no cardiac or respiratory issues a s a child. She was in her usual state of     Interval HPI 03/16/21  Since last visit, patient reports feeling the same from breathing stand point. She continues to have exertional SOB and orthopnea. She states that she sleeps with 5 pillows at night. No LE swelling, chest pain, hemoptysis, or cough. No postural dizziness or syncope.    Her ECHO revealed grade II diastolic dysfunction with elevated filling pressures and thalassemia was ruled out. She also had a CPET which was submaximal (by RER, peak HR, and lactate) but with with wide A-a difference and evidence of ventilatory inefficiency (VE/VCO2 slope >34).    She is not on supplemental O2.      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 No pets at home.     Past Medical History:  Past Medical History:   Diagnosis Date   ??? Abnormal ECG unsure   ??? Alcoholism (CMS-HCC)    ??? Allergic rhinitis    ??? Anemia    ??? Anxiety     insomnia   ??? Arthritis not sure   ??? Brain concussion    ??? Breast cancer (CMS-HCC)     chemo for now with mets   ??? COPD (chronic obstructive pulmonary disease) (CMS-HCC)    ??? Depression    ??? Dysphagia    ??? Genitourinary disease    ??? GERD (gastroesophageal reflux disease)    ??? Headache    ??? HTN (hypertension)    ??? Hyperlipidemia    ??? Insomnia    ??? 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 Stroke (CMS-HCC) Nov. 2020  Dr. Theodoro Kalata   ??? Tobacco dependence      Past Surgical History:   Procedure Laterality Date   ??? CESAREAN SECTION     ??? CHOLECYSTECTOMY     ??? FINGER SURGERY Right     trigger finger   ??? GANGLION CYST EXCISION Left     hand   ??? HYSTERECTOMY     ??? LYMPH NODE BIOPSY Right 02/08/2016    Axillary   ??? PR BRNSCHSC TNDSC EBUS DX/TX INTERVENTION PERPH LES Left 03/05/2020    Procedure: Bronch, Rigid Or Flexible, Including Fluoro Guidance, When Performed; With Transendoscopic Ebus During Bronchoscopic Diagnostic Or Therapeutic Intervention(S) For Peripheral Lesion(S);  Surgeon: Jerelyn Charles, MD;  Location: MAIN [facility];  Service: Pulmonary   ??? PR BRONCHOSCOPY,COMPUTER ASSIST/IMAGE-GUIDED NAVIGATION Left 03/05/2020    Procedure: BRONCHOSCOPY, RIGID OR FLEXIBLE, INCLUDE FLUORO WHEN PERFORMED; W/COMPUTER-ASSIST, IMAGE-GUIDED NAVIGATION;  Surgeon: Jerelyn Charles, MD;  Location: MAIN OR [facility];  Service: Pulmonary   ??? PR BRONCHOSCOPY,DIAGNOSTIC W LAVAGE Left 03/05/2020    Procedure: Bronchoscopy, Rigid Or Flexible, Include Fluoroscopic Guidance When Performed; W/Bronchial Alveolar Lavage;  Surgeon: Jerelyn Charles, MD;  Location: MAIN OR [facility];  Service: Pulmonary   ??? PR BRONCHOSCOPY,TRANSBRON ASPIR BX Left 03/05/2020    Procedure: Bronchoscopy, Rigid/Flex, Incl Fluoro; W/Transbronch Ndl Aspirat Bx, Trachea, Main Stem &/Or Lobar Bronchus;  Surgeon: Jerelyn Charles, MD;  Location: MAIN OR [facility];  Service: Pulmonary   ??? PR BRONCHOSCOPY,TRANSBRONCH BIOPSY Left 03/05/2020    Procedure: Bronchoscopy, Rigid/Flexible, Include Fluoro Guidance When Performed; W/Transbronchial Lung Bx, Single Lobe;  Surgeon: Jerelyn Charles, MD;  Location: MAIN OR [facility];  Service: Pulmonary   ??? PR CATH PLACE/CORON ANGIO, IMG SUPER/INTERP,W LEFT HEART VENTRICULOGRAPHY N/A 05/07/2020    Procedure: Left Heart Catheterization;  Surgeon: Lesle Reek, MD;  Location: [facility] CATH;  Service: Cardiology   ??? PR COLONOSCOPY W/BIOPSY SINGLE/MULTIPLE N/A 10/21/2016    Procedure: COLONOSCOPY, FLEXIBLE, PROXIMAL TO SPLENIC FLEXURE; WITH BIOPSY, SINGLE OR MULTIPLE;  Surgeon: Monte Fantasia, MD;  Location: GI PROCEDURES [facility];  Service: Gastroenterology   ??? PR EXCIS SUPRATENT BRAIN TUMOR Right 01/15/2019    Procedure: CRANIECTOMY; EXC BRAIN TUMOR-SUPRATENTORIAL;  Surgeon: Edison Simon, MD;  Location: MAIN OR [facility];  Service: Neurosurgery   ??? PR INCISE FINGER TENDON SHEATH Left 06/17/2019    Procedure: R-20 TENDON SHEATH INCISION (EG, FOR TRIGGER FINGER);  Surgeon: Daisy Lazar, MD;  Location: ASC OR [facility];  Service: Orthopedics   ??? PR MICROSURG TECHNIQUES,REQ OPER MICROSCOPE Right 01/15/2019    Procedure: MICROSURGICAL TECHNIQUES, REQUIRING USE OF OPERATING MICROSCOPE (LIST SEPARATELY IN ADDITION TO CODE FOR PRIMARY PROCEDURE);  Surgeon: Edison Simon, MD;  Location: MAIN OR [facility];  Service: Neurosurgery   ??? PR STEREOTACTIC COMP ASSIST PROC,CRANIAL,INTRADURAL Right 01/15/2019    Procedure: STEREOTACTIC COMPUTER-ASSISTED (NAVIGATIONAL) PROCEDURE; CRANIAL, INTRADURAL;  Surgeon: Edison Simon, MD;  Location: MAIN OR [facility];  Service: Neurosurgery   ??? PR UPPER GI ENDOSCOPY,BIOPSY N/A 10/21/2016    Procedure: UGI ENDOSCOPY; WITH BIOPSY, SINGLE OR MULTIPLE;  Surgeon: Monte Fantasia, MD;  Location: [facility]ED[facility];  Service: Gastroenterology   ??? PR UPPER GI ENDOSCOPY,BIOPSY N/A 04/02/2018    Procedure: UGI ENDOSCOPY; WITH BIOPSY, SINGLE OR MULTIPLE;  Surgeon: Wendall Papa, MD;  [facility];  Service: Gastroenterology   ??? SKIN BIOPSY         Other History:  The social history and family history were personally reviewed and updated in  the patient's electronic medical record.    Family History   Problem Relation Age of Onset   ??? Cancer Father    ??? Heart failure Father    ??? Heart attack Father    ??? Diabetes Sister    ??? Heart failure Sister    ??? Heart attack Sister    ??? Heart disease Sister    ??? Hypertension Sister    ??? Heart failure Brother    ??? Heart attack Brother    ??? Heart disease Brother    ??? Hypertension Brother    ??? Cancer Maternal Uncle    ??? Cancer Maternal Grandfather         lung cancer   ??? COPD Mother    ??? Hypertension Mother    ??? No Known Problems Maternal Aunt    ??? No Known Problems Paternal Aunt    ??? No Known Problems Paternal Uncle    ??? Heart disease Maternal Grandmother         mini strokes   ??? No Known Problems Paternal Grandmother    ??? No Known Problems Paternal Grandfather    ??? No Known Problems Other    ??? Cancer Maternal Uncle         Lung Cancer   ??? Cancer Paternal Aunt         Breast Cancer   ??? Diabetes Sister         Type 2   ??? Hypertension Sister    ??? Diabetes Sister         not sure what type ??? Hypertension Sister    ??? Hypertension Sister    ??? Anesthesia problems Neg Hx    ??? Broken bones Neg Hx    ??? Clotting disorder Neg Hx    ??? Collagen disease Neg Hx    ??? Dislocations Neg Hx    ??? Fibromyalgia Neg Hx    ??? Gout Neg Hx    ??? Hemophilia Neg Hx    ??? Osteoporosis Neg Hx    ??? Rheumatologic disease Neg Hx    ??? Scoliosis Neg Hx    ??? Severe sprains Neg Hx    ??? Sickle cell anemia Neg Hx    ??? Spinal Compression Fracture Neg Hx      Social History     Socioeconomic History   ??? Marital status: Divorced     Spouse name: Not on file   ??? Number of children: Not on file   ??? Years of education: Not on file   ??? Highest education level: Not on file   Occupational History   ??? Not on file   Tobacco Use   ??? Smoking status: Current Some Day Smoker     Packs/day: 1.00     Years: 40.00     Pack years: 40.00     Types: Cigarettes   ??? Smokeless tobacco: Never Used   ??? Tobacco comment: 10-20 single    Vaping Use   ??? Vaping Use: Never used   Substance and Sexual Activity   ??? Alcohol use: Not Currently     Alcohol/week: 0.0 standard drinks     Comment: Have stopped about 6 months ago   ??? Drug use: No   ??? Sexual activity: Yes     Partners: Male     Birth control/protection: Post-menopausal   Other Topics Concern   ??? Exercise No   ??? Living Situation Yes   ??? Do you use sunscreen? No   ???  Tanning bed use? No   ??? Are you easily burned? Not Asked   ??? Excessive sun exposure? Not Asked   ??? Blistering sunburns? Not Asked   Social History Narrative    She lives in senior housing in [place] x2 months. he has 4 sisters, 2 nearby and involved in care. She has 1 son-[name], recently married 61 yo, who lives about an hour and a half away in [place]. He works full-time as a Sales executive. She is still a chronic smoker (1ppd). She is currently divorced.         Goes to [employer] in [place] and gets a lot of support. Her church friend [name] and her sisters goes with her to chemo appts.     Social Determinants of Health     Financial Resource Strain: Medium Risk   ??? Difficulty of Paying Living Expenses: Somewhat hard   Food Insecurity: Food Insecurity Present   ??? Worried About Programme researcher, broadcasting/film/video in the Last Year: Sometimes true   ??? Ran Out of Food in the Last Year: Sometimes true   Transportation Needs: Not on file   Physical Activity: Not on file   Stress: Not on file   Social Connections: Not on file       Home Medications:  Current Outpatient Medications on File Prior to Visit   Medication Sig Dispense Refill   ??? aluminum-magnesium hydroxide-simethicone 200-200-20 mg/5 mL Susp 80 mL, diphenhydrAMINE 12.5 mg/5 mL Liqd 200 mg, nystatin 100,000 unit/mL Susp 8,000,000 Units, distilled water Liqd 80 mL, lidocaine 2% viscous 2 % Soln 80 mL Take 5 mL by mouth every six (6) hours as needed. Swish, gargle, spit or swallow if throat issues 80 mL 2   ??? clobetasoL (TEMOVATE) 0.05 % ointment Apply twice a day to rash on palm until smooth/clear. 30 g 1   ??? clonazePAM (KLONOPIN) 0.5 MG tablet Take 0.5 mg daily as needed for anxiety.  Do not exceed 0.5 mg/daily. 30 tablet 0   ??? diclofenac sodium (VOLTAREN) 1 % gel Apply 2 g topically four (4) times a day as needed for arthritis or pain. 150 g 2   ??? diphenoxylate-atropine (LOMOTIL) 2.5-0.025 mg per tablet Take 1 tablet by mouth 4 (four) times a day as needed for diarrhea. 30 tablet 1   ??? dorzolamide (TRUSOPT) 2 % ophthalmic solution Administer 1 drop into the left eye Three (3) times a day. 10 mL 12   ??? esomeprazole (NEXIUM) 40 MG capsule Take 1 capsule (40 mg total) by mouth daily. 90 capsule 3   ??? folic acid (FOLVITE) 1 MG tablet Take 1 tablet (1 mg total) by mouth daily. 100 tablet 3   ??? lasmiditan 50 mg Tab Take 50 mg by mouth once as needed (Once daily in 24hr period PRN migraine) for up to 1 dose. 7 tablet 1   ??? lisinopriL-hydrochlorothiazide (PRINZIDE,ZESTORETIC) 20-25 mg per tablet Take 1 tablet by mouth daily. 90 tablet 3   ??? naloxone (NARCAN) 4 mg nasal spray One spray in either nostril once for known/suspected opioid overdose. May repeat every 2-3 minutes in alternating nostril til EMS arrives 2 each 0   ??? nicotine (NICODERM CQ) 21 mg/24 hr patch Place 1 patch on the skin daily. 28 patch 2   ??? nicotine polacrilex (NICORETTE) 4 MG gum Apply 1 each (4 mg total) to cheek every hour as needed for smoking cessation. 110 each 2   ??? polyethylene glycol (MIRALAX) 17 gram/dose powder Take 17  g by mouth Three (3) times a day as needed (constipation). 850 g 3   ??? potassium chloride (KLOR-CON) 10 MEQ CR tablet Take 2 tablets (20 mEq total) by mouth daily. 60 tablet 2   ??? pregabalin (LYRICA) 200 MG capsule Take 1 capsule (200 mg total) by mouth Two (2) times a day. 60 capsule 2   ??? prochlorperazine (COMPAZINE) 10 MG tablet Take 1 tablet (10 mg total) by mouth every six (6) hours as needed for nausea. 30 tablet 6   ??? saliva stimulant comb. no.3 (BIOTENE MOISTURIZING MOUTH) Spry Take 1 spray by mouth every two (2) hours as needed (dry mouth). 44.3 mL prn   ??? traMADoL (ULTRAM) 50 mg tablet Take 1 tablet (50 mg total) by mouth every eight (8) hours as needed for pain. 20 tablet 0   ??? traZODone (DESYREL) 50 MG tablet Take 1.5 tablets (75 mg total) by mouth nightly. 45 tablet 1   ??? venlafaxine (EFFEXOR-XR) 150 MG 24 hr capsule Take 150 mg capsule in conjunction with 75 mg capsule for a total of 225 mg by mouth daily 30 capsule 2   ??? venlafaxine (EFFEXOR-XR) 75 MG 24 hr capsule Take 75 mg capsule in conjunction with 150 mg capsule by mouth daily for a total of 225 mg by mouth daily 30 capsule 2     No current facility-administered medications on file prior to visit.       Allergies:  Allergies as of 03/16/2021 - Reviewed 03/11/2021   Allergen Reaction Noted   ??? Adhesive Rash 02/04/2016   ??? Decadron [dexamethasone] Anxiety 01/29/2019   ??? Morphine Itching 01/28/2019   ??? Tetracycline  05/11/2017   ??? Oxycodone  02/04/2016   ??? Tegaderm Sherilyn Banker drug-allergy check Rash 02/25/2016       Review of Systems:  A comprehensive review of systems was completed and negative except as noted in HPI.    PHYSICAL EXAM:     Vitals:    03/16/21 0831   BP: 152/81   Pulse: 83   Temp: 36.3 ??C (97.3 ??F)   SpO2: 95%     General appearance - comfortable, in no respiratory distress  Psych-awake, alert, and oriented X3  Eyes- Sclera anicteric, conjunctiva pink  Mouth- moist  Neck/EENT- Trachea supple and midline, (-) JVD   Lymphatics/Hem- No enlarged LNs  CV- Normal S1 and S2, no murmur or gallop  Resp- Normal air movement bilaterally  GI- soft, non-tender abdomen, no organomegaly  MSK- No pedal edema, no clubbing or cyanosis  Skin- no rash  Neuro-non-focal     LABORATORY and RADIOLOGY DATA:     Pulmonary Function Tests/Interpretation:  Spirometry 09/04/20: Suggestive of moderate restriction        :  N/A    Pertinent Laboratory Data:  Lab Results   Component Value Date    WBC 5.6 03/08/2021    HGB 13.7 03/08/2021    HCT 41.2 03/08/2021    PLT 76 (L) 03/08/2021       Lab Results   Component Value Date    NA 137 03/02/2021    K 2.9 (L) 03/02/2021    CL 108 (H) 03/02/2021    CO2 21.4 03/02/2021    BUN 7 (L) 03/02/2021    CREATININE 0.48 (L) 03/02/2021    GLU 215 (H) 03/02/2021    CALCIUM 8.3 (L) 03/02/2021    MG 1.7 11/29/2019    PHOS 3.6 10/08/2020       Lab Results   Component Value  Date    BILITOT 0.6 03/08/2021    BILIDIR 0.30 03/08/2021    PROT 7.4 03/08/2021    ALBUMIN 3.8 03/08/2021    ALT 61 (H) 03/08/2021    AST 82 (H) 03/08/2021    ALKPHOS 365 (H) 03/08/2021       Lab Results   Component Value Date    PT 9.6 (L) 12/28/2018    INR 0.84 12/28/2018    APTT 30.4 12/28/2018         Pertinent Imaging Data: I personally reviewed imaging studies  CT Chest 09/01/20:  AIRWAYS, LUNGS, PLEURA: Clear central tracheobronchial tree.  No lung consolidation.  No pleural effusion.   ??  Slight decrease in left lower lobe mass measures measures 2.5 x 2.7 cm, previously 2.8 x 2.4 cm (2:63).   ??  Significant decrease in lingular mass measuring 0.8 x 0.9 cm, previously 1.5 x 1.1 cm (2:46).   ??  Slight interval increase in right middle lobe peri fissural nodule measuring 0.7 x 0.9 cm, previously 0.7 x 0.7 cm (2:54, 3:36).  ??  Unchanged additional small pulmonary nodules as below:  -(2:23) 0.4 cm right upper lobe  -(2:23) 0.5 cm left upper lobe  ??  Trace bibasilar linear atelectasis.  ??  MEDIASTINUM: Normal heart size.  No pericardial effusion.   Normal caliber thoracic aorta.  No mediastinal lymphadenopathy. Unchanged radiodense object within the central right-sided pulmonary arteries and calcification within the floor of the right atrium. Unchanged right chest wall Port-A-Cath with tip terminating at the mid right atrium.  ??  IMAGED ABDOMEN: Please see same day abdomen pelvis CT for discussion of findings below the diaphragm.     ??  SOFT TISSUES: Similar right breast scarring and 1.1 cm right breast nodule. Soft tissues otherwise unremarkable.  ??  BONES: Unremarkable.      Cardiac workup:  ECHO 02/20/20:    1. 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    2. There is mild mitral valve regurgitation.    3. The left ventricular systolic function is normal, LVEF is visually  estimated at > 55%.    4. The right ventricle is normal in size, with normal systolic function.    LHC 05/07/20: normal coronaries, normal LVEDP 9 mmHg    RHC: N/A    ASSESSMENT      Ms. Pinard is a 65 y.o. female with a history of GERD, depression, HTN, dyslipidemia, and metastatic HER2+ breast cancer (s/p radiotherapy and chemotherapy with Trastuzumab/Pertuzumab)  whom we are seeing in consultation requested by [name] for evaluation of shortness of breath.    Patient is presenting with dyspnea on exertion, dry cough and wheezing (mostly nocturnal) which started approximately a year after starting her breast cancer therapy. Although the history is suggestive of airway disease (possibly worsened by ongoing smoking, sinus and gastroesophageal diseases), her spirometry is more suggestive of moderate restriction with no evidence of airway obstruction. I think some of her symptoms can still be related to GERD/sinus disease and possible airway disease despite spirometric findings. She does not have significant parenchymal lung disease on CT to explain moderate restriction. No evidence of neuromuscular weakness was noted on MIP/MEP .     ECHO revealed grade II diastolic dysfunction with elevated filling pressures. Thus, orthopnea is more likely related to cardiac dysfunction. Thalassemia was ruled out, and VQ scan did not reveal clear perfusion defects but that was not excluded either. However, CPET (submaximal) but  with wide A-a difference and evidence of ventilatory inefficiency (VE/VCO2 slope >34).   Additionally, she has anemia that worsened over the past several months (Hgb 13 in 12/2018--> 7.5). Therefore, her dyspnea on exertion is likely multi-factorial. Hgb has improved recently, so less likely to be playing a role.    PLAN     Dyspnea on exertion 2/2 grade 2 diastolic dysfunction with elevated filling pressures, ?CTED/CTEPH   -- Optimize cardiac disease. Will check proBNP and BMP to assess need for more diuretics   -- Repeat VQ scan next visit +/- RHC  -- Continue PRN albuterol for now  -- Lifestyle modifications for GERD provided; continue PPI  -- Saline nasal rinses followed by nasal steroids spray    Metastatic lung mass (plan for radiation)  -- Management per primary oncologist    Health maintenance:  -- Vaccines: UTD  -- Smoking cessation: discussed at length; follow up with the tobacco cessation clinic  -- Lung cancer screening: getting serial CT's for breast cancer      Plan of care was discussed with the patient who acknowledged understanding and is in agreement.      Patient will return to clinic as needed.          Doron Shake O. Wellington Hampshire, MD   Assistant Professor of Medicine  Pulmonary and Critical Care Medicine  [facility] Multidisciplinary Sarcoidosis Program  [facility] Pulmonary Hypertension Program       CC:[name], [name], MD

## 2021-03-18 DIAGNOSIS — C50811 Malignant neoplasm of overlapping sites of right female breast: Principal | ICD-10-CM

## 2021-03-18 DIAGNOSIS — Z171 Estrogen receptor negative status [ER-]: Principal | ICD-10-CM

## 2021-03-22 ENCOUNTER — Ambulatory Visit: Admit: 2021-03-22 | Discharge: 2021-03-23 | Payer: MEDICARE

## 2021-03-22 ENCOUNTER — Institutional Professional Consult (permissible substitution): Admit: 2021-03-22 | Discharge: 2021-03-23 | Payer: MEDICARE

## 2021-03-22 DIAGNOSIS — K1379 Other lesions of oral mucosa: Principal | ICD-10-CM

## 2021-03-22 DIAGNOSIS — C50919 Malignant neoplasm of unspecified site of unspecified female breast: Principal | ICD-10-CM

## 2021-03-22 DIAGNOSIS — R945 Abnormal results of liver function studies: Principal | ICD-10-CM

## 2021-03-22 DIAGNOSIS — R791 Abnormal coagulation profile: Principal | ICD-10-CM

## 2021-03-23 DIAGNOSIS — C50811 Malignant neoplasm of overlapping sites of right female breast: Principal | ICD-10-CM

## 2021-03-23 MED ORDER — TUCATINIB 150 MG TABLET
ORAL_TABLET | Freq: Two times a day (BID) | ORAL | 2 refills | 30 days | Status: CP
Start: 2021-03-23 — End: ?
  Filled 2021-04-09: qty 120, 30d supply, fill #0

## 2021-03-23 NOTE — Unmapped (Signed)
Hi,     Patient contacted the Communication Center requesting results of the following:     Procedure: MRI  Completed On: 03/22/21    Please contact [name] at [phone] for proper follow up.    Check Indicates criteria has been reviewed and confirmed with the patient:    [x]  Preferred Name   [x]  DOB and/or MR#  [x]  Preferred Contact Method  [x]  Phone Number(s)   []  MyChart     Thank you,   Christell Faith  [facility]   [phone]

## 2021-03-23 NOTE — Unmapped (Addendum)
[facility] Specialty Medication Onboarding    Specialty Medication: Capecitabine 500mg  tablets  Prior Authorization: Approved   Financial Assistance: No - copay  <$25  Final Copay/Day Supply: $0 / 14    Insurance Restrictions: None     Notes to Pharmacist:     The triage team has completed the benefits investigation and has determined that the patient is able to fill this medication at [facility]. Please contact the patient to complete the onboarding or follow up with the prescribing physician as needed.    [facility] Specialty Medication Onboarding    Specialty Medication: [name] 150mg  tablet  Prior Authorization: Approved   Financial Assistance: No - copay  <$25  Final Copay/Day Supply: $4 / 30    Insurance Restrictions: None     Notes to Pharmacist:     The triage team has completed the benefits investigation and has determined that the patient is able to fill this medication at [facility]. Please contact the patient to complete the onboarding or follow up with the prescribing physician as needed.

## 2021-03-24 NOTE — Unmapped (Signed)
Addended by: Kerrin Champagne on: 03/24/2021 02:14 PM     Modules accepted: Orders

## 2021-03-24 NOTE — Unmapped (Signed)
Unsure of what it means to see the liver MD. Hemoptysis this past Sunday and decreasing over this past Monday. No hemoptysis yesterday and today. Breathing is stable. Visit on 4/4 and will see then as well.    Aware of when to be seen in ed if bleeding worsens.    Burtis Junes, FNP-BC, [facility]  Outpatient Oncology Palliative Care Service  [facility]  [address] [place], [place] [address]  [phone]

## 2021-03-24 NOTE — Unmapped (Signed)
Addended by: Kerrin Champagne on: 03/24/2021 03:55 PM     Modules accepted: Orders

## 2021-03-26 NOTE — Unmapped (Signed)
[facility]  -----------------------------------------------------------------------------------------------------  Follow-up Outpatient Evaluation    PCP: [name], MD     Consulting Physicians: Surgical oncology: Tyson Alias M.D.     Reason for Visit: Here for management of breast cancer.  ------------------------------------------------------------------------------------------------------  I personally spent 40 minutes face-to-face and non-face-to-face in the care of this patient, which includes all pre, intra, and post visit time on the date of service.  ------------------------------------------------------------------------------------------------------  Assessment:  Brooke [name] is a 65 y.o. 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 She is currently on TDM1.     Brooke [name] presents today ahead of C17D1 TDM1. In the interim, she had an abdominal MRI 03/22/21 which showed no concerning metastasis (area of concern =hepatic steatosis). She will f/u with hepatology 04/06/21. She had some nosebleeds 03/21/21 which improved over the next day.  Platelets were normal.  Her nosebleeds is resolved and is being managed with nasal spray and humidifiers. On clinical exam, patient notes severe pain on palpation to b/l lower scapula which I suspect is scapulothoracic bursitis. We discussed considering steroid injection. I will refer patient to Dr. Glenice Laine for further evaluation. We reviewed her latest US/MRI showed no liver metastases. As such, I suspect her abnormal LFT's are likely d/t her TDM-1. Labs continue to remain abnormal today (Alk phos 451, ALT 52, AST 68, K 3.0). I strongly recommended she follow-up with Hepatology 04/06/21. We discussed our plan to switch to capecitabine (Xeloda), tucatinib, and trastuzumab (Herceptin). I educated patient that she must take antiemetics 30 minutes prior to taking her Xeloda. Prescribed Zofran. She will be on Xeloda every 2 weeks and have 1 week off (Herceptin q3w). We reviewed the possible s/e of Xeloda. I explained that while on treatment, she can develop nausea and vomiting which can be managed with antiemetics. She can also develop worsening fatigue. I educated patient that another s/e is hand-foot syndrome. If patient develops hand-foot syndrome, I recommended she soak her hand in warm water greasy topical emollient. A possible s/e of both Xeloda and tucatinib is diarrhea which can be managed with Imodium or Lomotil. Prescribed Lomotil. She will plan to start Herceptin + Xeloda infusion next week. Will have our pharmacist expedite approval. She will RTC in 4 weeks for visit + labs. She will reach out in the interim if she has any questions or concerns.     1. Metastatic breast cancer HR + HER-2 overexpressing     A. Systemic therapy   First-line: THP. Taxol d/c for G3 neuropathy.  Pertuzumab d/c for G3 diarrhea.  Not progression.  Second line: Trastuzumab + AI -->Tamoxifen 2/2 arthralgias.  Third line: Rebiopsy ER -/PR 10%/HER-2 +ve. C1 TDM1 03/23/20 limited by neuropathy. 09/01/20: DR TDM1 to 2.4 mg/kg. Discontinue TDM1 d/t abnormal LFT's and not progression.   Fourth line:  Herceptin + tucatinib + capecitabine (Xeloda)  Subsequent lines:  Consider Enhertu on progression.     B.  Systemic imaging  12/08/20: BS: No bone metastases. 01/28/21 CT AP: SD in the chest. Hepatic steatosis w/ ill defined area in the left hepatic lobe. [place] 02/23/21 which was unremarkable for nodular regenerative hyperplasia.  MRI abdomen pending 03/22/21.     C.  Brain mets/neuroimaging  Surgery: 01/15/19: resection of the right frontal met.  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   03/10/21: MRI brain: SD.    D.  Molecular  Genomics:  STRATA: ERBB2, PIK3CA.  Not eligible for  HARMONY.  Genetic: Referral submitted in 03/2020, patient was never reached. Will f/u STAT DNA lab testing and Econsultation in next visit.    E. Radiation:   09/05/17: CK to 7 brain lesions. 09/13/18: CK (1#) right 9 mm lesion. 2/28 - 02/28/2019: CK 25 Gy in 5# to left frontal resection bed.    F. 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 Barbette Merino who thought not likely consequential but recommended CV cards eval.    > F/u with Dr. Harland German.   > Echo 12/30/20: EF 60-65%.     2.  Comorbidities/supportive care  > Dysphagia/GERD/hemoglobin drop: Continue Nexium/famotidine.  TTG positive.  Given stable Hgb will hold on endoscopy for now.  F/u with Dr. Randye Lobo.  Gluten-free diet.   > Depression/Anxiety: Venlafaxine 225 mg. C/w clonazepam. F/u psychiatry/[facility] (Dr. Vertell Limber).    > Peripheral neuropathy: Venlafaxine 225 mg. C/w Lyrica dose 200 mg BID.  Paraneoplastic panels unremarkable. Dose reduced TDM1.  Did not tolerate methadone due to sedation/dizziness. Follows with palliative care. Can frefer back to neurology to consider TENS versus scrambler therapy.   > Smoking cessation: Previously encouraged to stop smoking. 1 ppd currently. Previously prescribed Chantix.   > Insomnia: Continue trazodone 75mg .  No longer taking Remeron.   > Arthalgias: C-spine x-ray showed DJD.  Muscle relaxant by PCP. Referred to PT for back previously. Currently receiving neck PT.  If no improvement refer back to Dr. Glenice Laine. Consider PCI. Nondisplaced right hip fracture: Has opiates for mgmt. Discussed possible scrambler therapy. Recommended Tramadol.   > Wheezing:  Previous recommendations made to her to reschedule pulm appointment; recommended by PCP for COPD eval, and to again consider reducing smoking.  Following with pulmonary, Dr. Wellington Hampshire, MD.   > Goals Of Care: See prior notes discussed with Lubertha Basque.   >Petechiae rash: Secondary to Darene Lamer hair removal cream. Discontinue Nair cream.   > Pruritus: Likely secondary to TDM 1/trastuzumab.  Recommend Benadryl 25 mg as needed.   > Abnormal LFTs: Continues.   > Iron deficiency/fatigue: Calculated iron deficit 1.5 g.  IV iron 12/11/20 and 01/19/21. Thalassemia testing 12/29/2020 does not show hereditary link.   >Constipation: Recommended Miralax.   >Skin Lesion: Apply triple antibiotic cream and keep c/d/i.   >Abdominal Distention: F/u with Cardiology Dr. Harland German.  >Scapulothoracic bursitis: Consider steroid injections. Referred to Dr. Glenice Laine.   >Health Maintenance: COVID-19 vaccinated. Had booster.     3.  Follow-up   -- F/u with Hepatology 04/06/21  -- Will start Capecitabine (Xeloda) + Tucatinib + Herceptin next week.   -- RTC in 4 weeks to see Dr. [name] + labs.  -----------------------------------------------------------------------------------------------------  Interval history:  Brooke [name] is a 65 y.o. F who presents today accompanied by her two friends for consideration of C17 TDM1 therapy. In the interim, she developed diarrhea and noted having her potassium pills passing whole. Continued to take potassium pills with occasional diarrhea. She received MRI abdomen 03/22/21 which was fairly stable. She developed some hemoptysis 03/21/21 which improved 03/22/21. Was informed to f/u with ED if bleeding worsened.   -- Doing well today.   -- Bleeding resolved. Continues to use nasal spray and humidifier.   -- B/l scapular back pain: Worsening.   -- Callus to right foot. Difficulty in ambulation.   -- No new breast related complaints  -- Full 12 ROS reviewed and otherwise mild/none.     Social  history update: Sister Lisa's granddaughter recently passed away and her grandson was diagnosed with brain cancer. Has a boyfriend, [name], She has a son, [name].     Review of Systems: A complete twelve systems review was obtained and is positive per the HPI but otherwise negative or unchanged. See MIMS #1170 where available.    ONC HISTORY  Metastatic HR + HER-2 overexpressing breast cancer to lung and brain.  First-line and second line: Trastuzumab + AI -->Tamoxifen d/t arthralgias.  3rd line:  TDM1.  5th line: capecitabine (xeloda), tucatinib, herceptin    Consider [id number], Enhertu on progression.     Radiation: 09/05/17: CK to 7 brain lesions. 09/13/18: CK (1#) right 9 mm lesion. 2/28 - 02/28/2019: CK 25 Gy in 5# to left frontal resection bed.  Surgery: 01/15/19: resection of the right frontal met.  Genomic: STRATA: ERBB2, PIK3CA.  Not eligible for HARMONY.    2017 or earlier:  R breast wound and nipple inversion which patient describes herself as ignoring for some time    02/04/16:  Mammogram R breast showing 3.1 cm irregular spiculated dense mass in lateral Rt breast w nipple and skin retraction. 2 subcentimeter irregular masses in the RUOQ  (10', 11') (possible satellite lesions). Large Rt axillary lymph node 1.7. Lt breast clear. BIRAD 5.    02/08/16:  Core biopsy R axillary LN (breast bx deferred due to open wound) Gr 3, IDC with apocrine features. ER (-), PR (31-40), HER-2 (3+). Noted to have multiple skin lesions.    02/10/16:  Initial staging with CT CAP: Large, necrotic right breast mass with wide open tract to the skin, consistent with known right breast malignancy.  Numerous enlarged right axillary, subpectoral, mediastinal, bilateral hilar lymph nodes and innumerable bilateral pulmonary nodules.  Bone scan negative for osseous mets.    --Started trastuzumab initially with THP.  Taxol d/c after three cycles for G3 neuropathy.  Pertuzumab d/c for G3 diarrhea.  Not discontinued due to progression.    04/28/16:  Switched to anastrozole/trastuzumab  --patient had trouble tolerating AIs, switched to exemestane    07/13/16: genomic testing with STRATA showing ERBB2 copy # alteration, PIK3CA p.[id number]    08/01/17:  MRI showed multiple small brain metastases   --completed CK to 7 lesions on 09/05/17.   --CK to the right 9 mm lesion (1 fx on 09/13/18).    --continued on AI/trastuzumab    11/2018:  progression of her frontal lobe lesion.   --resection of the right frontal mass by Dr. Tresa Garter on 01/15/2019.  Pathology was consistent with metastatic breast cancer.  --2/28 - 02/28/2019: CK 25 Gy in 5# to left frontal resection bed     04/2019: switched to tamoxifen/trastuzmab due to intolerance of AIs (joint pain    09/2019:  Concern for possible slight extra-cranial progression in LLL lung mass, elected to continue on current tx    01/03/20:  Repeat CT with new lingular 1.9 cm nodular opacity; differential diagnosis includes new metastatic lesion and consolidative/infectious opacity. CT chest 1 month is recommended to help discriminate between these two etiologies.  Also slight increase R breast mass.    02/03/20:  Repeat CT again with lingular 1.9 cm nodule unchanged to slightly increased in size, left lower lobe 3.5 cm mass slightly increased in size. Discussed at [facility] due to concern that lingular mass might represent lung primary in current smoker. Recommended biopsy.      01/2020:  Changed to fulvestrant/trastuzumab due to progression    03/05/20:  FNA lung biopsy showing carcinoma c/w breast primary (GATA3+, TTF1-), ER neg/PR 10%/HER2 3+.  Switched to [employer]. C1D1 03/23/20.    03/29/21: Switched to capecitabine (xeloda), tucatinib, herceptin d/t abnormal LFT's.      Onc Family History:   - Father: prostate  - Mat grandfather: prostate  - Mat uncle: lung  - Pat aunt: breast   - Great niece: adrenal     Social History:   Social History     Social History Narrative    She lives in senior housing in [place] x2 months. he has 4 sisters, 2 nearby and involved in care. She has 1 son-[name], recently married 70 yo, who lives about an hour and a half away in [place]. He works full-time as a Sales executive. She is still a chronic smoker (1ppd). She is currently divorced.         Goes to [employer] in [place] and gets a lot of support. Her church friend [name] and her sisters goes with her to chemo appts.     Physical Examination:   Vital Signs: LMP  (LMP Unknown)  See flow sheet  General: Chronically ill appearing female in no acute distress.    HEENT: Well-healed (R) frontal scalp scar. EOMI. Sclerae anicteric.   Neck: Supple. No cervical or supraclavicular adenopathy.   Pulm: Lungs clear to auscultation bilat; breathing non-labored.  Cardiac: Regular rate and rhythm; no LE edema.    Musculoskeletal: Tenderness to b/l lower scapula.   Breasts/chest: Deferred today. (R) breast Stable to improved by my exam today.  (L) breast exam  ~ 1 cm opened wound. Port to the upper left chest.  Spider nevi to the central chest.   Neurologic:  Alert and oriented. Grossly non-focal  Lymphatic: No cervical or supraclavicular adenopathy. No palpable right axillary adenopathy.  Skin: Multiple skin lesion including petechiae and spider angiomata outlined bellow.  Extremity: Callus on right foot.       DATA REVIEW:    Pertinent labs/imaging/pathology reviewed in detail.    Scribe Statement: Documentation assistance was provided by me personally, Marcy Salvo, a scribe. Olivia Mackie, MD obtained and performed the history, physical exam and medical decision making elements that were entered into the chart. Signed by Marcy Salvo, 03/29/21 at 5:31 PM    Provider Attestation: Documentation assistance was provided by the Scribe, Marcy Salvo. I was present during the time the encounter was recorded. The information recorded by the Scribe was done at my direction and has been reviewed and validated by me. Signed by Olivia Mackie, MD 03/29/21 at 6:24 PM

## 2021-03-29 ENCOUNTER — Ambulatory Visit: Admit: 2021-03-29 | Discharge: 2021-03-30 | Payer: MEDICARE

## 2021-03-29 ENCOUNTER — Ambulatory Visit
Admit: 2021-03-29 | Discharge: 2021-03-30 | Payer: MEDICARE | Attending: Geriatric Medicine | Primary: Geriatric Medicine

## 2021-03-29 LAB — HEPATIC FUNCTION PANEL
ALBUMIN: 3.8 g/dL (ref 3.4–5.0)
ALKALINE PHOSPHATASE: 451 U/L — ABNORMAL HIGH (ref 46–116)
ALT (SGPT): 52 U/L — ABNORMAL HIGH (ref 10–49)
AST (SGOT): 68 U/L — ABNORMAL HIGH (ref <=34)
BILIRUBIN DIRECT: 0.3 mg/dL (ref 0.00–0.30)
BILIRUBIN TOTAL: 0.6 mg/dL (ref 0.3–1.2)
PROTEIN TOTAL: 7.2 g/dL (ref 5.7–8.2)

## 2021-03-29 LAB — COMPREHENSIVE METABOLIC PANEL
ALBUMIN: 3.8 g/dL (ref 3.4–5.0)
ALKALINE PHOSPHATASE: 451 U/L — ABNORMAL HIGH (ref 46–116)
ALT (SGPT): 52 U/L — ABNORMAL HIGH (ref 10–49)
ANION GAP: 5 mmol/L (ref 5–14)
AST (SGOT): 68 U/L — ABNORMAL HIGH (ref <=34)
BILIRUBIN TOTAL: 0.6 mg/dL (ref 0.3–1.2)
BLOOD UREA NITROGEN: <5 mg/dL — ABNORMAL LOW (ref 9–23)
CALCIUM: 9.2 mg/dL (ref 8.7–10.4)
CHLORIDE: 108 mmol/L — ABNORMAL HIGH (ref 98–107)
CO2: 24 mmol/L (ref 20.0–31.0)
CREATININE: 0.57 mg/dL — ABNORMAL LOW
EGFR CKD-EPI AA FEMALE: >90 mL/min/{1.73_m2} (ref >=60)
EGFR CKD-EPI NON-AA FEMALE: >90 mL/min/{1.73_m2} (ref >=60)
GLUCOSE RANDOM: 89 mg/dL (ref 70–179)
POTASSIUM: 3 mmol/L — ABNORMAL LOW (ref 3.4–4.8)
PROTEIN TOTAL: 7.2 g/dL (ref 5.7–8.2)
SODIUM: 137 mmol/L (ref 135–145)

## 2021-03-29 LAB — CBC W/ AUTO DIFF
BASOPHILS ABSOLUTE COUNT: 0 10*9/L (ref 0.0–0.1)
BASOPHILS RELATIVE PERCENT: 0.4 %
EOSINOPHILS ABSOLUTE COUNT: 0.1 10*9/L (ref 0.0–0.5)
EOSINOPHILS RELATIVE PERCENT: 1 %
HEMATOCRIT: 39.8 % (ref 34.0–44.0)
HEMOGLOBIN: 13.7 g/dL (ref 11.3–14.9)
LYMPHOCYTES ABSOLUTE COUNT: 1.9 10*9/L (ref 1.1–3.6)
LYMPHOCYTES RELATIVE PERCENT: 29.9 %
MEAN CORPUSCULAR HEMOGLOBIN CONC: 34.5 g/dL (ref 32.0–36.0)
MEAN CORPUSCULAR HEMOGLOBIN: 33.9 pg — ABNORMAL HIGH (ref 25.9–32.4)
MEAN CORPUSCULAR VOLUME: 98.2 fL — ABNORMAL HIGH (ref 77.6–95.7)
MEAN PLATELET VOLUME: 8.4 fL (ref 6.8–10.7)
MONOCYTES ABSOLUTE COUNT: 0.5 10*9/L (ref 0.3–0.8)
MONOCYTES RELATIVE PERCENT: 7.6 %
NEUTROPHILS ABSOLUTE COUNT: 3.8 10*9/L (ref 1.8–7.8)
NEUTROPHILS RELATIVE PERCENT: 61.1 %
PLATELET COUNT: 89 10*9/L — ABNORMAL LOW (ref 150–450)
RED BLOOD CELL COUNT: 4.05 10*12/L (ref 3.95–5.13)
RED CELL DISTRIBUTION WIDTH: 13.3 % (ref 12.2–15.2)
WBC ADJUSTED: 6.2 10*9/L (ref 3.6–11.2)

## 2021-03-29 MED ORDER — MORPHINE CONCENTRATE 100 MG/5 ML (20 MG/ML) ORAL SOLUTION
Freq: Three times a day (TID) | ORAL | 0 refills | 10.00000 days | Status: CP | PRN
Start: 2021-03-29 — End: 2021-03-29
  Filled 2021-03-29: qty 15, 10d supply, fill #0

## 2021-03-29 MED ORDER — ONDANSETRON 8 MG DISINTEGRATING TABLET
ORAL_TABLET | Freq: Two times a day (BID) | ORAL | 1 refills | 15 days | Status: CP | PRN
Start: 2021-03-29 — End: 2022-03-29

## 2021-03-29 MED ADMIN — heparin, porcine (PF) 100 unit/mL injection 500 Units: 500 [IU] | INTRAVENOUS | @ 23:00:00 | Stop: 2021-03-30

## 2021-03-29 NOTE — Unmapped (Unsigned)
OUTPATIENT ONCOLOGY PALLIATIVE CARE    Principal Diagnosis: Brooke [name] is a 65 y.o. female with metastatic breast cancer,  diagnosed in 2017.  Disease sites include lung and brain.     Assessment/Plan:   1.  Painful neuropathic hands and feet-stable and worsening mid back pain. Pain radiates to sides and anterior thoracic. Hx of cervical thoracic paraspinal trigger point bilateral this past Feb.     -Continue lyrica 200 mg bid dosing.  -Stop Norco 10/325 mg  -Continue Effexor 225 mg every day  -Continue diclofenac gel as needed  -Not able to tolerate NSAIDs (due to platelets) and tylenol (recent elevated liver enzymes)  -Start liquid morphine 10 mg every 8 hours as needed pain  -Will see Dr. Glenice Laine for consideration of another injection.    Opioid use in past:  -Had difficulty tolerating the methadone 2.5 mg dosing due to sedation.  Did not try the 1 mg methadone, so could consider this in the future if needed for the neuropathic discomfort.  Some reluctance in re-starting methadone.  -No relief with tramadol  -oxycodone-night terrors.      2.  Goals of care-not addressed today.      At prior visits: goals which are to decrease pain and to maintain her independence. Lives alone and Brooke [name] lives close by.     Wants to ensure that her quality of life remains high and the wish to keep on going.  Shared concern that she does not want to be in pain.  NP provided reassurance that our team can offer her different treatment strategies to help her with her goal.     Advance care planning-see advance care planning note dated 01/31/2020.  -at prior visits: Patient shared that she has been dreaming about death. She does have her funeral arrangements completed. Shared that she has her wishes written down and her Sister Brooke [name] knows where they are regarding her funeral. She still contemplating whether to be cremated or buried and she is leaning toward cremation. patient currently focused on cancer directed therapy.  Prefers to stay in the moment and not discussed things.  We will continue to support and address if she has a change in clinical condition.        ???Advance directives scanned in on 11/02/19        HCDM [facility]): [place] - Sister - [phone]    HCDM, [facility] [name] - Sister - [phone]    At prior visits,   # Controlled substances risk management.  We are not currently prescribing controlled medications for her.   - Patient has a signed pain medication agreement with Outpt Palliative care, completed on 11/01/18, as per standard care. This was signed again on 01/28/19   - [place] database was reviewed today and it was appropriate.   - Urine drug screen was not performed at this visit. Findings: not applicable.   - Patient has received information about safe storage and administration of medications.   - Patient has received a prescription for narcan and shared with Brooke [name] and pt.       F/u: 1 month phone    ----------------------------------------  Referring Provider: From inpatient oncology team  Oncology Team: Breast team-Dr. [name]  PCP: [name], MD      HPI: 65 year old woman with her to overexpressing metastatic breast cancer to her lung and brain.  Was found to have multiple small brain metastases and completed CyberKnife therapy on September 11. Describes ongoing pain in her pelvis, rates it as  a 3 out of 10 today.  Has been improving over the last week or 2.  Feels like gabapentin has been helpful, and is also responded well to Tylenol and ibuprofen in the past.  She is taken oxycodone and it gave her night terrors does not want to take it again.  Has taken Dilaudid previously and did not have side effects from this.    Current cancer-directed therapy: capecitabine (Xeloda), tucatinib, and trastuzumab (Herceptin).       Interval hx 03/29/21 Brooke [name], Brooke [name] and Lisa-Sisters    -CT scans on 01/28/21: Diffuse bone demineralization. Multilevel degenerative changes of the thoracolumbar spine with spine with intervertebral disc space collapse at the L5-S1 level. There is grade 1 anterolisthesis of L5 on S1 with bilateral pars defects. Ill-defined area of low attenuation in the left hepatic lobe, concerning for underlying lesion versus focal fat. Further evaluation with nonemergent MRI is recommended. Hepatic steatosis.  -MRI of abd -area in question on the prior CT and ultrasound likely represents a focal area of hepatic steatosis.  -Bursts of energy. Had been more active and doing more activity in last couple of days.  -Mid Back is hurting again. Pushing sensation. Radiates to front and has epigastric pulling sensation.  -Took 1 norco yesterday. Averaging 2-3 norcos/week.  -Sleeps on a stock of pillows. HT is ok.   -Toes sensation like needles-overall neuropathy is stable.   -Most nights not sleeping well. Is taking trazadon.  -Wake up coughing intermittent cough-thinks it is pollen.  -BMs every day to every other day. Using senokot prn. Did have diarrhea, but not now  -Mood good, may be day of some sadness, but not prolonged  -Vision ok for now  -Intentional wgt loss. Feels better being lighter.         Palliative Performance Scale: 80% - Ambulation: Full / Normal Activity with effort, some evidence of disease / Self-Care:Full / Intake: Normal or reduced / Level of Conscious: Full         Coping/Support Issues: Patient reports she is been able to attend church functions and is finding this very helpful. Has boyfriend Brooke [name] for the last 15 years.     At prior visits, patient did share that she continues to receive much help from her church friend, [name] and her husband.  They prayed together and she is found this supportive.  She states that her 2 sisters have been supportive, but she feels that they are becoming more less patient with her due to patient's irritability.     Overall coping well, no specific issues identified.  Finding support with her church community, her sister, Brooke [name], and prayer. Social History: Pt lives in senior housing in [place] month, says it's ok and quiet but she is from [place] and liked it there more. She has 4 sisters, 2 nearby and involved in care. She has 1 son-[name], 35yo, who lives about an hour and a half away in [place]. She is divorced.  ??  Goes to [employer] in [place] and gets a lot of support. Her church friend [name] goes with her to chemo appts. Her sisters will go too.    Advance Care Planning: Advance directives scanned into system  HCPOA: See ACP note  Living Will: See ACP note  ACP note: Yes, advance directive scanned in on November 6    Objective     Opioid Risk Tool:      Opioid Risk Tool:   Female  Female  Family history of substance abuse      Alcohol   1  3    Illegal drugs  2  3    Rx drugs  4  4    Personal history of substance abuse      Alcohol  3  3    Illegal drugs  4  4    Rx drugs  5  5    Age between 88???45 years  1  1    History of preadolescent sexual abuse  3 0    Psychological disease      ADD, OCD, bipolar, schizophrenia  2  2    Depression  1  1    Total: 7  (<3 low risk, 4-7 moderate risk, >8 high risk)      Oncology History Overview Note   Identifying Statement:  Ayaana Biondo is a 65 y.o. female diagnosed with a right posterior frontal lobe metastasis (breast primary) status post resection 01/15/2019 and 5/5 fractions of SBRT (2500 Gy via CyberKnife) to the resection cavity.    Treatment History:  09/05/17: S/p SRS to 7 lesions  01/15/19: S/p resection, followed by SRS 2500 cGy   03/04/19: KPS 80; MRI with postsurgical changes versus residual disease; RTC in 2 weeks w/ MRI   04/15/19: KPS NA; MRI w/ SD; no study; RTC 6 weeks w/ MRI   06/10/19: KPS 80; MRI w/ SD; RTC 4 weeks  07/15/19: KPS 80; MRI w/ SD; RTC 8 weeks  08/19/19: KPS 80; start nortriptyline for HAs; RTC 4 weeks w/ MRI  09/16/19: KPS 80; con't nortriptyline for HAs; RTC 2 mos w/ MRI  11/11/19: KPS 80; MRI w/ SD though w/ small infarct; proceed w/ CVA workup; start 81mg  ASA; encouraged smoking cessation; con't nortriptyline; RTC to discuss results  11/25/19: KPS 80; discussed CVA workup results; MRA unremarkable; con't smoking cessation efforts; con't nortriptyline for HAs; con't 81mg  ASA, start Lipitor; f/u with PCP for COPD eval; RTC 2 mos w/ MRI     Malignant neoplasm of overlapping sites of right female breast (CMS-HCC)   2017 -  Presenting Symptoms    Large open wound RT breast which began as nipple inversion. Patient states that she ignored for a long time.  Physical exam of the area of concern in the RTbreast demonstrates a large open wound in the lateral right breast. Saw PCP 01/27/16 Rx Keflex.     02/04/2016 Interval Scan(s)    MMG/US: 3.1 (3.0) cm irregular spiculated dense mass in lateral Rt breast w nipple and skin retraction. 2 subcentimeter irregular masses in the RUOQ  (10', 11') (possible satellite lesions). Large Rt axillary lymph node 1.7. Lt breast clear. BIRAD 5.     02/08/2016 Biopsy    US guided Rt. Axillary LN biopsy:  Gr 3, IDC with apocrine features. ER (-), PR (31-40), HER-2 (3+). Noted to have multiple skin lesions. Poor access to breast mass due open 10-15 cm wound risk of pain and bleeding.     02/09/2016 Initial Diagnosis    Malignant neoplasm of overlapping sites of right female breast (RAF-HCC)     02/10/2016 Interval Scan(s)    CT CAP: Large, necrotic right breast mass with wide open tract to the skin, consistent with known right breast malignancy.  Numerous enlarged right axillary, subpectoral, mediastinal, bilateral hilar lymph nodes and innumerable bilateral pulmonary nodules     02/10/2016 Interval Scan(s)    NM Bone scan: No osseous metastatic disease.     04/28/2016 -  03/10/2020 Chemotherapy    OP TRASTUZUMAB (EVERY 21 DAYS)  Trastuzumab 8 mg/kg loading then 6 mg/kg every 21 days     07/13/2016 Genetics    STRATA  ??? ERBB2 copy number alteration  Estimated copy number: 40, confidence interval: 35.7 - 44.0, cellularity: 50%  Associated FDA-approved targeted therapies in breast cancer: trastuzumab, lapatinib, ado-trastuzumab emtansine, pertuzumab  ??? PIK3CA p.E545A     01/03/2020 -  Cancer Staged    CT CAP 01/03/2020 which showed a new lingular opacity measuring 1.9 cm in the right lung. There was also slight increase in the right breast mass from 1.2-> 1.5 cm.  Bone scan shows no osseous metastases. Overall I considered these findings indeterminate and ppted to repeat CT CAP in 1 month     02/03/2020 -  Cancer Staged    CT chest 02/03/20 which showed that the lingular lesion has persisted and increased in size.  No mediastinal adenopathy reported.  She continues to report a nonproductive cough and mild dyspnea.  She is a chronic smoker and currently smokes on average half a pack per day.  My major concern is that this could be a second primary lung cancer as her other sites of diseases stable on current systemic therapy       02/03/2020 Endocrine/Hormone Therapy    Stop Tamoxifen, Add Fasoldex, continue Q3week Hercpetin     03/23/2020 -  Chemotherapy    OP BREAST ADO-TRASTUZUMAB EMTANSINE  ado-trastuzumab emtansine 3.6 mg/kg IV on day 1, every 21 days     Brain metastases (CMS-HCC)   08/07/2017 Initial Diagnosis    Brain metastases (CMS-HCC)    Summary of Radiosurgery   Rx:7 brain met: 09/05/2017: 2,000/2,000 cGy  Sec:R Frontal: 09/05/2017: 2,000 cGy  Sec:L Post Fr: 09/05/2017: 2,000 cGy  Sec:L Ant Fro: 09/05/2017: 2,000 cGy  Sec:R Sup Oc: 09/05/2017: 2,000 cGy  Sec:L [place]: 09/05/2017: 2,000 cGy  Sec:R Inf Occi: 09/05/2017: 2,000 cGy  Sec:L Tempor: 09/05/2017: 2,000 cGy  Rx:Lt Med Oc: 09/13/2018: 2,000/2,000 cGy  Rx:Rt Frontal: : 2,500/2,500 cGy    01/15/2019: Craniotomy and resection of right posterior frontal lobe metastasis. Followed by [facility]    03/04/19: KPS 80; MRI with postsurgical changes versus residual disease; RTC in 2 weeks w/ MRI     01/03/2020 -  Cancer Staged    CT CAP 01/03/2020 which showed a new lingular opacity measuring 1.9 cm in the right lung. There was also slight increase in the right breast mass from 1.2-> 1.5 cm.  Bone scan shows no osseous metastases. Overall I considered these findings indeterminate and ppted to repeat CT CAP in 1 month     02/03/2020 -  Cancer Staged    CT chest 02/03/20 which showed that the lingular lesion has persisted and increased in size.  No mediastinal adenopathy reported.  She continues to report a nonproductive cough and mild dyspnea.  She is a chronic smoker and currently smokes on average half a pack per day.  My major concern is that this could be a second primary lung cancer as her other sites of diseases stable on current systemic therapy       02/03/2020 Endocrine/Hormone Therapy    Stop Tamoxifen, Add Fasoldex, continue Q3week Hercpetin     Metastatic breast cancer (CMS-HCC)   01/25/2019 Initial Diagnosis    Metastatic breast cancer (CMS-HCC)     03/23/2020 -  Chemotherapy    OP BREAST ADO-TRASTUZUMAB EMTANSINE  ado-trastuzumab emtansine 3.6 mg/kg IV on day 1, every 21 days  Patient Active Problem List   Diagnosis   ??? Malignant neoplasm of overlapping sites of right female breast (CMS-HCC)   ??? Peripheral neuropathy   ??? Insomnia   ??? Brain metastases (CMS-HCC)   ??? Nausea   ??? Closed fracture of one rib with routine healing   ??? Diarrhea of presumed infectious origin   ??? Failure to thrive in adult   ??? Tobacco use   ??? Chronic diarrhea   ??? Depression   ??? Hyponatremia   ??? Back pain   ??? Closed fracture of multiple pubic rami, right, initial encounter (CMS-HCC)   ??? Macrocytosis   ??? Pelvic fracture (CMS-HCC)   ??? Metastatic breast cancer (CMS-HCC)   ??? Migraine without aura and without status migrainosus, not intractable   ??? Essential hypertension   ??? CVA (cerebral vascular accident) (CMS-HCC)   ??? DDD (degenerative disc disease), cervical   ??? Wheezing   ??? Dyslipidemia   ??? Angina pectoris (CMS-HCC)   ??? Papillary fibroelastoma of heart   ??? Antineoplastic chemotherapy induced anemia   ??? Celiac disease   ??? Iron deficiency anemia due to chronic blood loss   ??? Dry eye syndrome, bilateral   ??? Meibomian gland dysfunction (MGD) of both eyes   ??? Glaucoma suspect of both eyes   ??? Incipient cataract of both eyes       Past Medical History:   Diagnosis Date   ??? Abnormal ECG unsure   ??? Alcoholism (CMS-HCC)    ??? Allergic rhinitis    ??? Anemia    ??? Anxiety     insomnia   ??? Arthritis not sure   ??? Brain concussion    ??? Breast cancer (CMS-HCC)     chemo for now with mets   ??? COPD (chronic obstructive pulmonary disease) (CMS-HCC)    ??? Depression    ??? Dysphagia    ??? Genitourinary disease    ??? GERD (gastroesophageal reflux disease)    ??? Headache    ??? HTN (hypertension)    ??? Hyperlipidemia    ??? Insomnia    ??? 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 Stroke (CMS-HCC) Nov. 2020    Dr. Theodoro Kalata   ??? Tobacco dependence        Past Surgical History:   Procedure Laterality Date   ??? CESAREAN SECTION     ??? CHOLECYSTECTOMY     ??? FINGER SURGERY Right     trigger finger   ??? GANGLION CYST EXCISION Left     hand   ??? HYSTERECTOMY     ??? LYMPH NODE BIOPSY Right 02/08/2016    Axillary   ??? PR BRNSCHSC TNDSC EBUS DX/TX INTERVENTION PERPH LES Left 03/05/2020    Procedure: Bronch, Rigid Or Flexible, Including Fluoro Guidance, When Performed; With Transendoscopic Ebus During Bronchoscopic Diagnostic Or Therapeutic Intervention(S) For Peripheral Lesion(S);  Surgeon: Jerelyn Charles, MD;  Location: MAIN OR [facility];  Service: Pulmonary   ??? PR BRONCHOSCOPY,COMPUTER ASSIST/IMAGE-GUIDED NAVIGATION Left 03/05/2020    Procedure: BRONCHOSCOPY, RIGID OR FLEXIBLE, INCLUDE FLUORO WHEN PERFORMED; W/COMPUTER-ASSIST, IMAGE-GUIDED NAVIGATION;  Surgeon: Jerelyn Charles, MD;  Location: MAIN OR [facility];  Service: Pulmonary   ??? PR BRONCHOSCOPY,DIAGNOSTIC W LAVAGE Left 03/05/2020    Procedure: Bronchoscopy, Rigid Or Flexible, Include Fluoroscopic Guidance When Performed; W/Bronchial Alveolar Lavage;  Surgeon: Jerelyn Charles, MD;  Location: MAIN OR [facility];  Service: Pulmonary   ??? PR BRONCHOSCOPY,TRANSBRON ASPIR BX Left 03/05/2020    Procedure: Bronchoscopy, Rigid/Flex,  Incl Fluoro; W/Transbronch Ndl Aspirat Bx, Trachea, Main Stem &/Or Lobar Bronchus;  Surgeon: Jerelyn Charles, MD;  Location: MAIN OR [facility];  Service: Pulmonary   ??? PR BRONCHOSCOPY,TRANSBRONCH BIOPSY Left 03/05/2020    Procedure: Bronchoscopy, Rigid/Flexible, Include Fluoro Guidance When Performed; W/Transbronchial Lung Bx, Single Lobe;  Surgeon: Jerelyn Charles, MD;  Location: MAIN OR [facility];  Service: Pulmonary   ??? PR CATH PLACE/CORON ANGIO, IMG SUPER/INTERP,W LEFT HEART VENTRICULOGRAPHY N/A 05/07/2020    Procedure: Left Heart Catheterization;  Surgeon: Lesle Reek, MD;  Location: [facility] CATH;  Service: Cardiology   ??? PR COLONOSCOPY W/BIOPSY SINGLE/MULTIPLE N/A 10/21/2016    Procedure: COLONOSCOPY, FLEXIBLE, PROXIMAL TO SPLENIC FLEXURE; WITH BIOPSY, SINGLE OR MULTIPLE;  Surgeon: Monte Fantasia, MD;  Location: GI PROCEDURES [facility];  Service: Gastroenterology   ??? PR EXCIS SUPRATENT BRAIN TUMOR Right 01/15/2019    Procedure: CRANIECTOMY; EXC BRAIN TUMOR-SUPRATENTORIAL;  Surgeon: Edison Simon, MD;  Location: MAIN OR [facility];  Service: Neurosurgery   ??? PR INCISE FINGER TENDON SHEATH Left 06/17/2019    Procedure: R-20 TENDON SHEATH INCISION (EG, FOR TRIGGER FINGER);  Surgeon: Daisy Lazar, MD;  Location: ASC OR [facility];  Service: Orthopedics   ??? PR MICROSURG TECHNIQUES,REQ OPER MICROSCOPE Right 01/15/2019    Procedure: MICROSURGICAL TECHNIQUES, REQUIRING USE OF OPERATING MICROSCOPE (LIST SEPARATELY IN ADDITION TO CODE FOR PRIMARY PROCEDURE);  Surgeon: Edison Simon, MD;  Location: MAIN OR [facility];  Service: Neurosurgery   ??? PR STEREOTACTIC COMP ASSIST PROC,CRANIAL,INTRADURAL Right 01/15/2019    Procedure: STEREOTACTIC COMPUTER-ASSISTED (NAVIGATIONAL) PROCEDURE; CRANIAL, INTRADURAL;  Surgeon: Edison Simon, MD;  Location: MAIN OR [facility];  Service: Neurosurgery   ??? PR UPPER GI ENDOSCOPY,BIOPSY N/A 10/21/2016    Procedure: UGI ENDOSCOPY; WITH BIOPSY, SINGLE OR MULTIPLE;  Surgeon: Monte Fantasia, MD;  Location: GI PROCEDURES [facility];  Service: Gastroenterology   ??? PR UPPER GI ENDOSCOPY,BIOPSY N/A 04/02/2018    Procedure: UGI ENDOSCOPY; WITH BIOPSY, SINGLE OR MULTIPLE;  Surgeon: Wendall Papa, MD;  [facility];  Service: Gastroenterology   ??? SKIN BIOPSY         Current Outpatient Medications   Medication Sig Dispense Refill   ??? albuterol HFA 90 mcg/actuation inhaler Inhale 2 puffs every six (6) hours as needed for wheezing.     ??? aluminum-magnesium hydroxide-simethicone 200-200-20 mg/5 mL Susp 80 mL, diphenhydrAMINE 12.5 mg/5 mL Liqd 200 mg, nystatin 100,000 unit/mL Susp 8,000,000 Units, distilled water Liqd 80 mL, lidocaine 2% viscous 2 % Soln 80 mL Take 5 mL by mouth every six (6) hours as needed. Swish, gargle, spit or swallow if throat issues 80 mL 2   ??? capecitabine (XELODA) 500 MG tablet Take 3 tablets (1,500 mg total) by mouth Two (2) times a day . 84 tablet 3   ??? clobetasoL (TEMOVATE) 0.05 % ointment Apply twice a day to rash on palm until smooth/clear. 30 g 1   ??? clonazePAM (KLONOPIN) 0.5 MG tablet Take 0.5 mg daily as needed for anxiety.  Do not exceed 0.5 mg/daily. 30 tablet 0   ??? diclofenac sodium (VOLTAREN) 1 % gel Apply 2 g topically four (4) times a day as needed for arthritis or pain. 150 g 2   ??? diphenoxylate-atropine (LOMOTIL) 2.5-0.025 mg per tablet Take 1 tablet by mouth 4 (four) times a day as needed for diarrhea. 30 tablet 1   ??? dorzolamide (TRUSOPT) 2 % ophthalmic solution Administer 1 drop into the left eye Three (3) times a day.  10 mL 12   ??? esomeprazole (NEXIUM) 40 MG capsule Take 1 capsule (40 mg total) by mouth daily. 90 capsule 3   ??? folic acid (FOLVITE) 1 MG tablet Take 1 tablet (1 mg total) by mouth daily. 100 tablet 3   ??? lasmiditan 50 mg Tab Take 50 mg by mouth once as needed (Once daily in 24hr period PRN migraine) for up to 1 dose. 7 tablet 1   ??? lisinopriL-hydrochlorothiazide (PRINZIDE,ZESTORETIC) 20-25 mg per tablet Take 1 tablet by mouth daily. 90 tablet 3   ??? naloxone (NARCAN) 4 mg nasal spray One spray in either nostril once for known/suspected opioid overdose. May repeat every 2-3 minutes in alternating nostril til EMS arrives 2 each 0   ??? nicotine (NICODERM CQ) 21 mg/24 hr patch Place 1 patch on the skin daily. 28 patch 2   ??? nicotine polacrilex (NICORETTE) 4 MG gum Apply 1 each (4 mg total) to cheek every hour as needed for smoking cessation. 110 each 2   ??? polyethylene glycol (MIRALAX) 17 gram/dose powder Take 17 g by mouth Three (3) times a day as needed (constipation). 850 g 3   ??? potassium chloride (KLOR-CON) 10 MEQ CR tablet Take 2 tablets (20 mEq total) by mouth daily. 60 tablet 2   ??? pregabalin (LYRICA) 200 MG capsule Take 1 capsule (200 mg total) by mouth Two (2) times a day. 60 capsule 2   ??? prochlorperazine (COMPAZINE) 10 MG tablet Take 1 tablet (10 mg total) by mouth every six (6) hours as needed for nausea. 30 tablet 6   ??? saliva stimulant comb. no.3 (BIOTENE MOISTURIZING MOUTH) Spry Take 1 spray by mouth every two (2) hours as needed (dry mouth). 44.3 mL prn   ??? traMADoL (ULTRAM) 50 mg tablet Take 1 tablet (50 mg total) by mouth every eight (8) hours as needed for pain. 20 tablet 0   ??? traZODone (DESYREL) 50 MG tablet Take 1.5 tablets (75 mg total) by mouth nightly. 45 tablet 1   ??? tucatinib (TUKYSA) 150 mg tablet Take 2 tablets (300 mg total) by mouth two (2) times a day . 120 tablet 2   ??? venlafaxine (EFFEXOR-XR) 150 MG 24 hr capsule Take 150 mg capsule in conjunction with 75 mg capsule for a total of 225 mg by mouth daily 30 capsule 2   ??? venlafaxine (EFFEXOR-XR) 75 MG 24 hr capsule Take 75 mg capsule in conjunction with 150 mg capsule by mouth daily for a total of 225 mg by mouth daily 30 capsule 2     No current facility-administered medications for this visit.       Allergies:   Allergies   Allergen Reactions   ??? Adhesive Rash   ??? Decadron [Dexamethasone] Anxiety     Mania as well   ??? Morphine Itching   ??? Tetracycline      Other reaction(s): Other (See Comments)   ??? Oxycodone      Night terrors   ??? Tegaderm Adhesive-No Drug-Allergy Check Rash       Family History:  Cancer-related family history includes Cancer in her father, maternal grandfather, maternal uncle, maternal uncle, and paternal aunt.  She indicated that the status of her mother is unknown. She indicated that the status of her father is unknown. She indicated that the status of her brother is unknown. She indicated that the status of her maternal grandmother is unknown. She indicated that the status of her maternal grandfather is unknown. She indicated that the  status of her paternal grandmother is unknown. She indicated that the status of her paternal grandfather is unknown. She indicated that the status of her maternal aunt is unknown. She indicated that the status of her paternal uncle is unknown. She indicated that the status of her neg hx is unknown. She indicated that the status of her other is unknown.          Lab Results   Component Value Date    CREATININE 0.55 (L) 03/16/2021     Lab Results   Component Value Date    ALKPHOS 419 (H) 03/16/2021    BILITOT 0.6 03/16/2021    BILIDIR 0.30 03/08/2021    PROT 7.4 03/16/2021    ALBUMIN 3.7 03/16/2021    ALT 72 (H) 03/16/2021    AST 103 (H) 03/16/2021             Burtis Junes, FNP-BC, [facility]  Outpatient Oncology Palliative Care Service  [facility]  [address] [place], [place] [address]  [phone]     {    Coding tips - Do not edit this text, it will delete upon signing of note!    ?? Telephone visits [phone] for Physicians and APP??s and [phone] for Non- Physician Clinicians)- Only use minutes on the phone to determine level of service.    ?? Video visits [id number]) - Use both minutes on video and pre/post minutes to determine level of service.       :[id number]}    I spent 35 minutes on the phone with the patient on the date of service and I spent an additional 15 minutes on pre- and post-visit activities on the date of service.     The patient was physically located in [place] or a state in which I am permitted to provide care. The patient and/or parent/guardian understood that s/he may incur co-pays and cost sharing, and agreed to the telemedicine visit. The visit was reasonable and appropriate under the circumstances given the patient's presentation at the time.    The patient and/or parent/guardian has been advised of the potential risks and limitations of this mode of treatment (including, but not limited to, the absence of in-person examination) and has agreed to be treated using telemedicine. The patient's/patient's family's questions regarding telemedicine have been answered.     If the visit was completed in an ambulatory setting, the patient and/or parent/guardian has also been advised to contact their provider???s office for worsening conditions, and seek emergency medical treatment and/or call 911 if the patient deems either necessary.    Brooke [name]

## 2021-03-30 NOTE — Unmapped (Signed)
Pt is not receiving treatment today, per Dr. [name].   Line care provided, port flushed, heparinized per protocol, de-accessed.   Pt is discharged from clinic in NAD, accompanied by family.

## 2021-03-30 NOTE — Unmapped (Addendum)
It was a pleasure to see you today in the Medical Oncology Clinic.  Please call our nurse navigator, Modena Jansky, if you have any interval questions or concerns:     For appointments, please call [phone]     Nurse Navigator:   Modena Jansky, RN: phone: [phone]     Prescription refills: [phone]     FAX: [phone]     For emergencies, evenings or weekends, please call [phone] and ask for the oncology fellow on call.     Reasons to call emergency line may include:   Fever of 100.5 or greater   Nausea and/or vomiting not relieved with nausea medicine   Diarrhea or constipation not relieved with bowel regimen   Severe pain not relieved with usual pain regimen      Labs from today:  Lab Results   Component Value Date    WBC 6.2 03/29/2021    HGB 13.7 03/29/2021    HCT 39.8 03/29/2021    MCV 98.2 (H) 03/29/2021    PLT 89 (L) 03/29/2021       Lab Results   Component Value Date    NEUTROABS 3.8 03/29/2021                     Future Appointments   Date Time Provider Department Center   04/06/2021  9:00 AM Janyth Pupa, MD [facility]   04/22/2021  1:30 PM Nonnie Done, MD [id number]   05/17/2021  1:20 PM Adaline Sill, MD [facility]

## 2021-03-30 NOTE — Unmapped (Signed)
Port accessed, labs drawn. Flushed with NSx2. Pt tolerated procedure w/o without difficulty. NAD, no questions, no complaints at time.

## 2021-03-31 NOTE — Unmapped (Signed)
Pharmacist Oral Chemotherapy Education    Medications: Tucatinib + Capecitabine + Trastuzumab    Medication Education     Brooke [name] is a 65 y.o. female with HER2 positive metastatic breast cancer who I am counseling today on oral chemotherapy.  I spoke to her sister, [name].    Oral chemotherapy regimen:               Tucatinib 300 mg po BID continuous              Capecitabine 1500 mg BID x 14 days then 7 days off              Trastuzumab IV q 3weeks  Tentative Start Date: Pending  Pharmacy: Pending benefit investigation     Medication indication, administration twice daily after a meal, and  regimen schedule of each drug were explained.  Oral chemotherapy handling precautions were also reviewed with the patient.  Side effects discussed included but were not limited to: nausea/vomiting, complications of myelosuppression, mucositis, diarrhea, hand/foot syndrome, fatigue, headache, rash and decreased appetite.       Patient was instructed to apply lotion to hands and feet twice daily to reduce risk of HFS.  Avoidance of excessive friction/rubbing of hands and feet as well as wearing good-fitting, comfortable shoes can also reduce irritation.  I also discussed the use of Imodium to treat diarrhea,  good oral care with salt water rinses to reduce the risk of mouth sores, and use of anti-emetics to treat nausea and vomiting.    Patient has received trastuzumab in the past and did not want additional education on this medication.     Drug Interactions:   1. Tucatinib is a CYP 3A4 inhibitor and can potentially increase the concentrations of morphine and trazodone. Dose reductions of morphine and trazodone may be necessary.  2. Folic acid can increase toxicity of capecitabine.  Recommend to avoid folic acid.    Handout provided via My Chart: Patient drug information handout from [employer]    Patient's sister verbalized understanding of the above information as well as how to contact the Team with any questions/concerns.    Approximate time with patient's sister: 25 minutes

## 2021-04-01 NOTE — Unmapped (Signed)
Spoke to [place] who shared that the morphine did help with her back pain.  She did not take the full dose of the 10 mg / 0.5 mL.  NP explained that Aimee shared that we need to be careful with the morphine use and her starting her new therapy of the tucatinib.  The morphine did not produce any itching.  Encouraged her to take the smaller dose of morphine 0.3 mL as needed every 8 hours if needed.  [name] is going to place a call to Dr. Glenice Laine to arrange for another injection for her back.    Reviewed that the tucatinib may have an interaction with the trazodone and the plan is to decrease the trazodone to 50 mg at at bedtime.  She does have 100 mg tablets and will only take 50 mg tablets and not the 75 mg.    Advised to stop the folic acid.    Burtis Junes, FNP-BC, [facility]  Outpatient Oncology Palliative Care Service  [facility]  [address] [place], [place] [address]  [phone]

## 2021-04-01 NOTE — Unmapped (Signed)
Geronimo Running with [facility] contacted the Communication Center requesting to speak with the care team of Brooke [name] to discuss:    Patient is requesting home health support. Asking if Dr. [name] would follow pt for orders.    Please contact Annette at [phone].    Thank you,   Kelli Hope  [facility]   [phone]

## 2021-04-02 NOTE — Unmapped (Signed)
Spoke with Drinda Butts, she is faxing over paperwork for Dr. [name] team to fill out, patient is trying to get home health aid through [facility]. Will look out for this. EMR

## 2021-04-05 NOTE — Unmapped (Signed)
[facility]   Patient Onboarding/Medication Counseling    Brooke [name] is a 65 y.o. female with breast cancer who I am counseling today on initiation of therapy.  I am speaking to the patient's family member, sister Brooke [name].    Was a Nurse, learning disability used for this call? No    Verified patient's date of birth / HIPAA.    Specialty medication(s) to be sent: Hematology/Oncology: Capecitabine 500mg , directions: 1500mg  2 times a day for 14 days on and 7 days off      Non-specialty medications/supplies to be sent: n/a      Medications not needed at this time: n/a         Xeloda (capecitabine)    Medication & Administration     Dosage: 1500mg  (three 500mg  tablets) 2 times a day for 7 days on and 7 days off    Administration: I reviewed the importance of taking with a full glass of water within 30 minutes of a meal (at least 1 cup of food). Discussed that tablet should be swallowed whole and cannot be crushed or chewed.    Adherence/Missed dose instruction: If a dose is missed, do not take an extra dose or two doses at one time. Simply take your next dose at the regularly scheduled time and record any missed doses so our team is aware.    Goals of Therapy     Prevent disease progression    Side Effects & Monitoring Parameters     ??? Nausea/vomiting  ??? Diarrhea/constipation  ??? Infection precautions  ??? Fatigue  ??? Mouth sores or irritation  ??? Hand/foot syndrome (tingling, numbness, pain, redness, peeling or blistering) Use Urea 20% cream, Aquaphor, Eucerin, Cetaphil, or CeraVe twice daily on hands and feet as preventative  ??? Sun precautions  ??? Decrease appetite/ taste changes  ??? Bleeding precautions (bruising easily, nose bleeds, gums bleed)  ??? Headache  ??? Dry skin  ??? Hair loss    The following side effects should be reported to the provider:  ??? Diarrhea not controlled by anti-diarrheals  ??? Swelling, warmth, numbness, change of color or pain in a leg or arm  ??? Signs of infection (fever >100.4, chills, sore throat, sputum production)  ??? Signs of liver problems (dark urine, abdominal pain, light-colored stools, vomiting, yellow skin or eyes)  ??? Signs of bleeding (vomiting or coughing up blood, blood that looks like coffee grounds, blood in the urine or black, red tarry stools, bruising that gets bigger without reason, any persistent or severe bleeding)  ??? Skin rash or signs of skin infection (cracking, peeling, blistering, bleeding skin, red or irritated eyes)  ??? Signs of fluid and electrolyte problems (mood changes, confusion, abnormal/fast  heartbeat, severe dizziness, passing out, increased thirst, seizures, loss of strength and energy, lack of appetite, unable to pass urine or change in amount of urine passed, dry mouth, dry eyes, or nausea or vomiting.  ??? Signs of hand foot syndrome (redness or irritation on the palms of the hands or soles of the feet)  ??? Signs of mucositis (red or swollen mouth/gums, sores in the mouth, gums or tongue, soreness or pain in the mouth or throat, difficulty swallowing or talking, dryness, mild burning, or pain when eating food white patches or pus in the mouth or on the tongue, increased mucus or thicker saliva)  ??? Signs of anaphylaxis (wheezing, chest tightness, swelling of face, lips, tongue or throat)    Monitoring parameters: Renal function should be estimated at baseline  to determine initial dose. During therapy, CBC with differential, hepatic function, and renal function should be monitored. Monitor INR closely if receiving concomitant warfarin. Pregnancy test prior to treatment initiation (in females of reproductive potential). Monitor for diarrhea, dehydration, hand-foot syndrome, Stevens-Johnson syndrome, toxic epidermal necrolysis, stomatitis, and cardiotoxicity. Monitor adherence.    Contraindications, Warnings & Precautions     [place] Boxed Warning: Capecitabine may increase the anticoagulant effects of warfarin; bleeding events, including death, have occurred with concomitant use. Clinically significant increases in prothrombin time (PT) and INR have occurred within several days to months after capecitabine initiation (in patients previously stabilized on anticoagulants), and may continue up to 1 month after capecitabine discontinuation. May occur in patients with or without liver metastases. Monitor PT and INR frequently and adjust anticoagulation dosing accordingly. An increased risk of coagulopathy is correlated with a cancer diagnosis and age >60 years.    Contraindications:  ??? Known hypersensitivity to capecitabine, fluorouracil, or any component of the formulation;   ??? Severe renal impairment (CrCl <30 mL/minute)    Warnings and Precautions:  ??? Bone marrow suppression  ??? Cardiotoxicity: Myocardial infarction, ischemia, angina, dysrhythmias, cardiac arrest, cardiac failure, sudden death, ECG changes, and cardiomyopathy  ??? Mouth sores-discussed use of baking soda/salt water rinses  ??? Dermatologic toxicity: Stevens-Johnson syndrome and toxic epidermal necrolysis   ??? Hand/foot prevention (moisturizers, luke warm hand washes, patting hands/feet dry, decrease rubbing on hands/feet)  ??? GI toxicity: May cause diarrhea (may be severe); Importance of good nutrition to help minimize diarrhea (high protein, BRAT, yogurt, avoid greasy and spicy foods).  Importance of hydration if having frequent diarrhea  ??? Reproductive concerns: Evaluate pregnancy status prior to therapy in females of reproductive potential. Females of reproductive potential should use effective contraception during treatment and for 6 months after the last dose. Males with female partners of reproductive potential should use effective contraception during treatment and for 3 months after the last dose. Based on the mechanism of action and data from animal reproduction studies, in utero exposure to capecitabine may cause fetal harm.   ??? Breast feeding considerations: It is not known if capecitabine is present in breast milk. Due to the potential for serious adverse reactions in the breastfed infant, breastfeeding is not recommended by the manufacturer during treatment and for 2 weeks after the last dose.    Drug/Food Interactions     ??? Medication list reviewed in Epic. The patient was instructed to inform the care team before taking any new medications or supplements. No drug interactions identified.   ??? Avoid live vaccines.    Storage, Handling Precautions, & Disposal     ??? This medication should be stored at room temperature and in a dry location. Keep out of reach of others including children and pets. Keep the medicine in the original container with a child-proof top (no pillboxes). Do not throw away or flush unused medication down the toilet or sink. This drug is considered hazardous and should be handled as little as possible.  If someone else helps with medication administration, they should wear gloves.      Current Medications (including OTC/herbals), Comorbidities and Allergies     Current Outpatient Medications   Medication Sig Dispense Refill   ??? albuterol HFA 90 mcg/actuation inhaler Inhale 2 puffs every six (6) hours as needed for wheezing.     ??? aluminum-magnesium hydroxide-simethicone 200-200-20 mg/5 mL Susp 80 mL, diphenhydrAMINE 12.5 mg/5 mL Liqd 200 mg, nystatin 100,000 unit/mL Susp 8,000,000 Units, distilled water Liqd  80 mL, lidocaine 2% viscous 2 % Soln 80 mL Take 5 mL by mouth every six (6) hours as needed. Swish, gargle, spit or swallow if throat issues 80 mL 2   ??? capecitabine (XELODA) 500 MG tablet Take 3 tablets (1,500 mg total) by mouth Two (2) times a day . (Patient not taking: Reported on 03/29/2021) 84 tablet 3   ??? clobetasoL (TEMOVATE) 0.05 % ointment Apply twice a day to rash on palm until smooth/clear. 30 g 1   ??? clonazePAM (KLONOPIN) 0.5 MG tablet Take 0.5 mg daily as needed for anxiety.  Do not exceed 0.5 mg/daily. 30 tablet 0   ??? diclofenac sodium (VOLTAREN) 1 % gel Apply 2 g topically four (4) times a day as needed for arthritis or pain. 150 g 2   ??? diphenoxylate-atropine (LOMOTIL) 2.5-0.025 mg per tablet Take 1 tablet by mouth 4 (four) times a day as needed for diarrhea. 30 tablet 1   ??? dorzolamide (TRUSOPT) 2 % ophthalmic solution Administer 1 drop into the left eye Three (3) times a day. 10 mL 12   ??? esomeprazole (NEXIUM) 40 MG capsule Take 1 capsule (40 mg total) by mouth daily. 90 capsule 3   ??? folic acid (FOLVITE) 1 MG tablet Take 1 tablet (1 mg total) by mouth daily. 100 tablet 3   ??? lasmiditan 50 mg Tab Take 50 mg by mouth once as needed (Once daily in 24hr period PRN migraine) for up to 1 dose. (Patient not taking: Reported on 03/29/2021) 7 tablet 1   ??? lisinopriL-hydrochlorothiazide (PRINZIDE,ZESTORETIC) 20-25 mg per tablet Take 1 tablet by mouth daily. 90 tablet 3   ??? MORPhine 100 mg/5 mL (20 mg/mL) concentrated solution Take 0.5 mL (10 mg total) by mouth every eight (8) hours as needed for pain. 15 mL 0   ??? naloxone (NARCAN) 4 mg nasal spray One spray in either nostril once for known/suspected opioid overdose. May repeat every 2-3 minutes in alternating nostril til EMS arrives (Patient not taking: Reported on 03/29/2021) 2 each 0   ??? nicotine (NICODERM CQ) 21 mg/24 hr patch Place 1 patch on the skin daily. (Patient not taking: Reported on 03/29/2021) 28 patch 2   ??? nicotine polacrilex (NICORETTE) 4 MG gum Apply 1 each (4 mg total) to cheek every hour as needed for smoking cessation. (Patient not taking: Reported on 03/29/2021) 110 each 2   ??? ondansetron (ZOFRAN-ODT) 8 MG disintegrating tablet Take 1 tablet (8 mg total) by mouth every twelve (12) hours as needed for nausea. 30 tablet 1   ??? polyethylene glycol (MIRALAX) 17 gram/dose powder Take 17 g by mouth Three (3) times a day as needed (constipation). 850 g 3   ??? potassium chloride (KLOR-CON) 10 MEQ CR tablet Take 2 tablets (20 mEq total) by mouth daily. 60 tablet 2   ??? pregabalin (LYRICA) 200 MG capsule Take 1 capsule (200 mg total) by mouth Two (2) times a day. 60 capsule 2   ??? prochlorperazine (COMPAZINE) 10 MG tablet Take 1 tablet (10 mg total) by mouth every six (6) hours as needed for nausea. 30 tablet 6   ??? saliva stimulant comb. no.3 (BIOTENE MOISTURIZING MOUTH) Spry Take 1 spray by mouth every two (2) hours as needed (dry mouth). (Patient not taking: Reported on 03/29/2021) 44.3 mL prn   ??? traZODone (DESYREL) 50 MG tablet Take 1.5 tablets (75 mg total) by mouth nightly. 45 tablet 1   ??? tucatinib (TUKYSA) 150 mg tablet Take 2 tablets (300  mg total) by mouth two (2) times a day . (Patient not taking: Reported on 03/29/2021) 120 tablet 2   ??? venlafaxine (EFFEXOR-XR) 150 MG 24 hr capsule Take 150 mg capsule in conjunction with 75 mg capsule for a total of 225 mg by mouth daily 30 capsule 2   ??? venlafaxine (EFFEXOR-XR) 75 MG 24 hr capsule Take 75 mg capsule in conjunction with 150 mg capsule by mouth daily for a total of 225 mg by mouth daily 30 capsule 2     No current facility-administered medications for this visit.       Allergies   Allergen Reactions   ??? Adhesive Rash   ??? Decadron [Dexamethasone] Anxiety     Mania as well   ??? Morphine Itching   ??? Tetracycline      Other reaction(s): Other (See Comments)   ??? Oxycodone      Night terrors   ??? Tegaderm Adhesive-No Drug-Allergy Check Rash       Patient Active Problem List   Diagnosis   ??? Malignant neoplasm of overlapping sites of right female breast (CMS-HCC)   ??? Peripheral neuropathy   ??? Insomnia   ??? Brain metastases (CMS-HCC)   ??? Nausea   ??? Closed fracture of one rib with routine healing   ??? Diarrhea of presumed infectious origin   ??? Failure to thrive in adult   ??? Tobacco use   ??? Chronic diarrhea   ??? Depression   ??? Hyponatremia   ??? Back pain   ??? Closed fracture of multiple pubic rami, right, initial encounter (CMS-HCC)   ??? Macrocytosis   ??? Pelvic fracture (CMS-HCC)   ??? Metastatic breast cancer (CMS-HCC)   ??? Migraine without aura and without status migrainosus, not intractable   ??? Essential hypertension   ??? CVA (cerebral vascular accident) (CMS-HCC)   ??? DDD (degenerative disc disease), cervical   ??? Wheezing   ??? Dyslipidemia   ??? Angina pectoris (CMS-HCC)   ??? Papillary fibroelastoma of heart   ??? Antineoplastic chemotherapy induced anemia   ??? Celiac disease   ??? Iron deficiency anemia due to chronic blood loss   ??? Dry eye syndrome, bilateral   ??? Meibomian gland dysfunction (MGD) of both eyes   ??? Glaucoma suspect of both eyes   ??? Incipient cataract of both eyes       Reviewed and up to date in Epic.    Appropriateness of Therapy     Acute infections noted within Epic:  No active infections  Patient reported infection: None    Is medication and dose appropriate based on diagnosis and infection status? Yes    Prescription has been clinically reviewed: Yes      Baseline Quality of Life Assessment      How many days over the past month did your condition  keep you from your normal activities? For example, brushing your teeth or getting up in the morning. 0    Financial Information     Medication Assistance provided: Prior Authorization    Anticipated copay of $0 reviewed with patient. Verified delivery address.    Delivery Information     Scheduled delivery date: 04/10/21    Expected start date: TBD    Medication will be delivered via Next Day Courier to the prescription address in [facility].  This shipment will not require a signature.      Explained the services we provide at [facility] and that each month we would call  to set up refills.  Stressed importance of returning phone calls so that we could ensure they receive their medications in time each month.  Informed patient that we should be setting up refills 7-10 days prior to when they will run out of medication.  A pharmacist will reach out to perform a clinical assessment periodically.  Informed patient that a welcome packet, containing information about our pharmacy and other support services, a Notice of Privacy Practices, and a drug information handout will be sent.      Patient verbalized understanding of the above information as well as how to contact the pharmacy at [phone] option 4 with any questions/concerns.  The pharmacy is open Monday through Friday 8:30am-4:30pm.  A pharmacist is available 24/7 via pager to answer any clinical questions they may have.    Patient Specific Needs     - Does the patient have any physical, cognitive, or cultural barriers? No    - Patient prefers to have medications discussed with  Family Member     - Is the patient or caregiver able to read and understand education materials at a high school level or above? Yes    - Patient's primary language is  English     - Is the patient high risk? Yes, patient is taking oral chemotherapy. Appropriateness of therapy as been assessed    - Does the patient require a Care Management Plan? No     - Does the patient require physician intervention or other additional services (i.e. nutrition, smoking cessation, social work)? No      Florene Route [facility] Specialty Pharmacist

## 2021-04-05 NOTE — Unmapped (Signed)
[facility]   Patient Onboarding/Medication Counseling    Ms.Brooke [name] is a 65 y.o. female with breast cancer who I am counseling today on initiation of therapy.  I am speaking to the patient's family member, sister [name].    Was a Nurse, learning disability used for this call? No    Verified patient's date of birth / HIPAA.    Specialty medication(s) to be sent: Hematology/Oncology: [name] 150mg       Non-specialty medications/supplies to be sent: n/a      Medications not needed at this time: n/a       Tukysa (tucatinib)    Medication & Administration     Dosage: 300mg  (two 150mg  tablets) 2 times a day    Administration: I reviewed the importance of taking roughly every 12 hours and can be taken with or without food. Discussed that tablet should be swallowed whole and cannot be crushed or chewed. Generally given with Xeloda or Herceptin.    Adherence/Missed dose instruction: If a dose is missed, do not take an extra dose or two doses at one time. Simply take your next dose at the regularly scheduled time and record any missed doses so our team is aware. If you vomit after taking a dose do not take another dose until next scheduled time.    Goals of Therapy     Prevent disease progression in HER-2 positive advanced unresectable or metastatic breast cancer (including patients with brain metastases).    Side Effects & Monitoring Parameters     ??? Nausea/vomiting  ??? Severe Diarrhea (median time of onset 12 days)  ??? Fatigue  ??? Liver problems (elevated LFT's that should be checked baseline and every 3 weeks during treatment)  ??? Infection precautions  ??? Anemia  ??? Mouth sores  ??? Hand/foot syndrome (tingling, numbness, pain, redness, peeling or blistering)  ??? Decrease appetite/ taste changes  ??? Muscle/joint pain  ??? Headache  ??? Rash      The following side effects should be reported to the provider    ??? Heartbeat that doesn't feel normal (heart feels like it's racing, skipping a beat or fluttering)  ??? Decrease in urination, change in color of urine, blood in urine  ??? Any signs of liver toxicity (yellowing of skin or eyes, itching, dark or brown urine or discomfort in right upper stomach)   ??? Diarrhea not controlled by anti-diarrheal  ??? Mouth irritation or mouth sores  ??? Signs of allergic reaction (rash, hives, shortness of breath)  ??? Redness or irritation on the palms of the hands or soles of the feet  ??? Signs of infection (fever >100.5, chills, sore throat)    Monitoring parameters:    Monitor ALT, AST, and bilirubin (prior to tucatinib initiation, every 3 weeks during treatment, and as clinically indicated); renal function (prior to treatment initiation) and as clinically necessary. Evaluate pregnancy status prior to treatment initiation (in females of reproductive potential). Monitor for signs/symptoms of diarrhea. Monitor adherence.      Warnings & Precautions     ??? Mouth sores-discussed use of baking soda/salt water rinses  ??? Hand/foot prevention (moisturizers, luke warm hand washes, patting hands/feet dry, decrease rubbing on hands/feet)  ??? Importance of hydration if having frequent diarrhea  ??? Importance of good nutrition to help minimize diarrhea (high protein, BRAT, yogurt, avoid greasy and spicy foods)  ??? Exposure of an unborn child to this medication could cause birth defects so you should not become pregnant or father a child while  on this medication and for 1 week upon completion of treatment.    Drug/Food Interactions     ??? Medication list reviewed in Epic. The patient was instructed to inform the care team before taking any new medications or supplements. No drug interactions identified.  ??? Avoid Grapefruit and grapefruit juice    Storage, Handling Precautions, & Disposal     ??? This medication should be stored at room temperature and in a dry location. Keep out of reach of others including children and pets.   ??? Keep the medicine in the original container with a child-proof top (no pillboxes). Keep the desiccant packet in the original container to protect from moisture and discard any remaining tablets 3 months after opening the bottle.    ??? Do not throw away or flush unused medication down the toilet or sink. This drug is considered hazardous and should be handled as little as possible.  If someone else helps with medication administration, they should wear gloves.      Current Medications (including OTC/herbals), Comorbidities and Allergies     Current Outpatient Medications   Medication Sig Dispense Refill   ??? albuterol HFA 90 mcg/actuation inhaler Inhale 2 puffs every six (6) hours as needed for wheezing.     ??? aluminum-magnesium hydroxide-simethicone 200-200-20 mg/5 mL Susp 80 mL, diphenhydrAMINE 12.5 mg/5 mL Liqd 200 mg, nystatin 100,000 unit/mL Susp 8,000,000 Units, distilled water Liqd 80 mL, lidocaine 2% viscous 2 % Soln 80 mL Take 5 mL by mouth every six (6) hours as needed. Swish, gargle, spit or swallow if throat issues 80 mL 2   ??? capecitabine (XELODA) 500 MG tablet Take 3 tablets (1,500 mg total) by mouth Two (2) times a day . (Patient not taking: Reported on 03/29/2021) 84 tablet 3   ??? clobetasoL (TEMOVATE) 0.05 % ointment Apply twice a day to rash on palm until smooth/clear. 30 g 1   ??? clonazePAM (KLONOPIN) 0.5 MG tablet Take 0.5 mg daily as needed for anxiety.  Do not exceed 0.5 mg/daily. 30 tablet 0   ??? diclofenac sodium (VOLTAREN) 1 % gel Apply 2 g topically four (4) times a day as needed for arthritis or pain. 150 g 2   ??? diphenoxylate-atropine (LOMOTIL) 2.5-0.025 mg per tablet Take 1 tablet by mouth 4 (four) times a day as needed for diarrhea. 30 tablet 1   ??? dorzolamide (TRUSOPT) 2 % ophthalmic solution Administer 1 drop into the left eye Three (3) times a day. 10 mL 12   ??? esomeprazole (NEXIUM) 40 MG capsule Take 1 capsule (40 mg total) by mouth daily. 90 capsule 3   ??? folic acid (FOLVITE) 1 MG tablet Take 1 tablet (1 mg total) by mouth daily. 100 tablet 3   ??? lasmiditan 50 mg Tab Take 50 mg by mouth once as needed (Once daily in 24hr period PRN migraine) for up to 1 dose. (Patient not taking: Reported on 03/29/2021) 7 tablet 1   ??? lisinopriL-hydrochlorothiazide (PRINZIDE,ZESTORETIC) 20-25 mg per tablet Take 1 tablet by mouth daily. 90 tablet 3   ??? MORPhine 100 mg/5 mL (20 mg/mL) concentrated solution Take 0.5 mL (10 mg total) by mouth every eight (8) hours as needed for pain. 15 mL 0   ??? naloxone (NARCAN) 4 mg nasal spray One spray in either nostril once for known/suspected opioid overdose. May repeat every 2-3 minutes in alternating nostril til EMS arrives (Patient not taking: Reported on 03/29/2021) 2 each 0   ??? nicotine (NICODERM CQ) 21  mg/24 hr patch Place 1 patch on the skin daily. (Patient not taking: Reported on 03/29/2021) 28 patch 2   ??? nicotine polacrilex (NICORETTE) 4 MG gum Apply 1 each (4 mg total) to cheek every hour as needed for smoking cessation. (Patient not taking: Reported on 03/29/2021) 110 each 2   ??? ondansetron (ZOFRAN-ODT) 8 MG disintegrating tablet Take 1 tablet (8 mg total) by mouth every twelve (12) hours as needed for nausea. 30 tablet 1   ??? polyethylene glycol (MIRALAX) 17 gram/dose powder Take 17 g by mouth Three (3) times a day as needed (constipation). 850 g 3   ??? potassium chloride (KLOR-CON) 10 MEQ CR tablet Take 2 tablets (20 mEq total) by mouth daily. 60 tablet 2   ??? pregabalin (LYRICA) 200 MG capsule Take 1 capsule (200 mg total) by mouth Two (2) times a day. 60 capsule 2   ??? prochlorperazine (COMPAZINE) 10 MG tablet Take 1 tablet (10 mg total) by mouth every six (6) hours as needed for nausea. 30 tablet 6   ??? saliva stimulant comb. no.3 (BIOTENE MOISTURIZING MOUTH) Spry Take 1 spray by mouth every two (2) hours as needed (dry mouth). (Patient not taking: Reported on 03/29/2021) 44.3 mL prn   ??? traZODone (DESYREL) 50 MG tablet Take 1.5 tablets (75 mg total) by mouth nightly. 45 tablet 1   ??? tucatinib (TUKYSA) 150 mg tablet Take 2 tablets (300 mg total) by mouth two (2) times a day . (Patient not taking: Reported on 03/29/2021) 120 tablet 2   ??? venlafaxine (EFFEXOR-XR) 150 MG 24 hr capsule Take 150 mg capsule in conjunction with 75 mg capsule for a total of 225 mg by mouth daily 30 capsule 2   ??? venlafaxine (EFFEXOR-XR) 75 MG 24 hr capsule Take 75 mg capsule in conjunction with 150 mg capsule by mouth daily for a total of 225 mg by mouth daily 30 capsule 2     No current facility-administered medications for this visit.       Allergies   Allergen Reactions   ??? Adhesive Rash   ??? Decadron [Dexamethasone] Anxiety     Mania as well   ??? Morphine Itching   ??? Tetracycline      Other reaction(s): Other (See Comments)   ??? Oxycodone      Night terrors   ??? Tegaderm Adhesive-No Drug-Allergy Check Rash       Patient Active Problem List   Diagnosis   ??? Malignant neoplasm of overlapping sites of right female breast (CMS-HCC)   ??? Peripheral neuropathy   ??? Insomnia   ??? Brain metastases (CMS-HCC)   ??? Nausea   ??? Closed fracture of one rib with routine healing   ??? Diarrhea of presumed infectious origin   ??? Failure to thrive in adult   ??? Tobacco use   ??? Chronic diarrhea   ??? Depression   ??? Hyponatremia   ??? Back pain   ??? Closed fracture of multiple pubic rami, right, initial encounter (CMS-HCC)   ??? Macrocytosis   ??? Pelvic fracture (CMS-HCC)   ??? Metastatic breast cancer (CMS-HCC)   ??? Migraine without aura and without status migrainosus, not intractable   ??? Essential hypertension   ??? CVA (cerebral vascular accident) (CMS-HCC)   ??? DDD (degenerative disc disease), cervical   ??? Wheezing   ??? Dyslipidemia   ??? Angina pectoris (CMS-HCC)   ??? Papillary fibroelastoma of heart   ??? Antineoplastic chemotherapy induced anemia   ??? Celiac disease   ???  Iron deficiency anemia due to chronic blood loss   ??? Dry eye syndrome, bilateral   ??? Meibomian gland dysfunction (MGD) of both eyes   ??? Glaucoma suspect of both eyes   ??? Incipient cataract of both eyes       Reviewed and up to date in Epic.    Appropriateness of Therapy     Acute infections noted within Epic:  No active infections  Patient reported infection: None    Is medication and dose appropriate based on diagnosis and infection status? Yes    Prescription has been clinically reviewed: Yes      Baseline Quality of Life Assessment      How many days over the past month did your condition  keep you from your normal activities? For example, brushing your teeth or getting up in the morning. 0    Financial Information     Medication Assistance provided: Prior Authorization    Anticipated copay of $4 reviewed with patient. Verified delivery address.    Delivery Information     Scheduled delivery date: 04/10/21    Expected start date: TBD    Medication will be delivered via Next Day Courier to the prescription address in [facility].  This shipment will not require a signature.      Explained the services we provide at [facility] and that each month we would call to set up refills.  Stressed importance of returning phone calls so that we could ensure they receive their medications in time each month.  Informed patient that we should be setting up refills 7-10 days prior to when they will run out of medication.  A pharmacist will reach out to perform a clinical assessment periodically.  Informed patient that a welcome packet, containing information about our pharmacy and other support services, a Notice of Privacy Practices, and a drug information handout will be sent.      Patient verbalized understanding of the above information as well as how to contact the pharmacy at [phone] option 4 with any questions/concerns.  The pharmacy is open Monday through Friday 8:30am-4:30pm.  A pharmacist is available 24/7 via pager to answer any clinical questions they may have.    Patient Specific Needs     - Does the patient have any physical, cognitive, or cultural barriers? No    - Patient prefers to have medications discussed with  Family Member     - Is the patient or caregiver able to read and understand education materials at a high school level or above? Yes    - Patient's primary language is  English     - Is the patient high risk? Yes, patient is taking oral chemotherapy. Appropriateness of therapy as been assessed    - Does the patient require a Care Management Plan? No     - Does the patient require physician intervention or other additional services (i.e. nutrition, smoking cessation, social work)? No      Florene Route [facility] Specialty Pharmacist

## 2021-04-06 ENCOUNTER — Ambulatory Visit: Admit: 2021-04-06 | Discharge: 2021-04-07 | Payer: MEDICARE | Attending: Internal Medicine | Primary: Internal Medicine

## 2021-04-06 DIAGNOSIS — Z1159 Encounter for screening for other viral diseases: Principal | ICD-10-CM

## 2021-04-06 DIAGNOSIS — R945 Abnormal results of liver function studies: Principal | ICD-10-CM

## 2021-04-06 LAB — HM HEPATITIS C SCREENING LAB: HM Hepatitis Screen: NEGATIVE

## 2021-04-06 LAB — COMPREHENSIVE METABOLIC PANEL
ALBUMIN: 3.8 g/dL (ref 3.4–5.0)
ALKALINE PHOSPHATASE: 505 U/L — ABNORMAL HIGH (ref 46–116)
ALT (SGPT): 64 U/L — ABNORMAL HIGH (ref 10–49)
ANION GAP: 8 mmol/L (ref 5–14)
AST (SGOT): 100 U/L — ABNORMAL HIGH (ref <=34)
BILIRUBIN TOTAL: 0.6 mg/dL (ref 0.3–1.2)
BLOOD UREA NITROGEN: <5 mg/dL — ABNORMAL LOW (ref 9–23)
CALCIUM: 9.7 mg/dL (ref 8.7–10.4)
CHLORIDE: 107 mmol/L (ref 98–107)
CO2: 24 mmol/L (ref 20.0–31.0)
CREATININE: 0.62 mg/dL
EGFR CKD-EPI AA FEMALE: >90 mL/min/{1.73_m2} (ref >=60)
EGFR CKD-EPI NON-AA FEMALE: >90 mL/min/{1.73_m2} (ref >=60)
GLUCOSE RANDOM: 109 mg/dL (ref 70–179)
POTASSIUM: 3 mmol/L — ABNORMAL LOW (ref 3.4–4.8)
PROTEIN TOTAL: 7.6 g/dL (ref 5.7–8.2)
SODIUM: 139 mmol/L (ref 135–145)

## 2021-04-06 LAB — CBC
HEMATOCRIT: 43.1 % (ref 34.0–44.0)
HEMOGLOBIN: 14.4 g/dL (ref 11.3–14.9)
MEAN CORPUSCULAR HEMOGLOBIN CONC: 33.3 g/dL (ref 32.0–36.0)
MEAN CORPUSCULAR HEMOGLOBIN: 32.7 pg — ABNORMAL HIGH (ref 25.9–32.4)
MEAN CORPUSCULAR VOLUME: 98.3 fL — ABNORMAL HIGH (ref 77.6–95.7)
MEAN PLATELET VOLUME: 8.2 fL (ref 6.8–10.7)
PLATELET COUNT: 110 10*9/L — ABNORMAL LOW (ref 150–450)
RED BLOOD CELL COUNT: 4.39 10*12/L (ref 3.95–5.13)
RED CELL DISTRIBUTION WIDTH: 13.4 % (ref 12.2–15.2)
WBC ADJUSTED: 5.4 10*9/L (ref 3.6–11.2)

## 2021-04-06 LAB — HEPATITIS B SURFACE ANTIGEN: HEPATITIS B SURFACE ANTIGEN: NONREACTIVE

## 2021-04-06 LAB — HEPATITIS B SURFACE ANTIBODY
HEPATITIS B SURFACE ANTIBODY QUANT: <8 m[IU]/mL (ref <8.00)
HEPATITIS B SURFACE ANTIBODY: NONREACTIVE

## 2021-04-06 LAB — HEPATITIS C ANTIBODY: HEPATITIS C ANTIBODY: NONREACTIVE

## 2021-04-06 LAB — PROTIME-INR
INR: 0.91
PROTIME: 10.6 s (ref 10.3–13.4)

## 2021-04-06 LAB — HEPATITIS B CORE ANTIBODY, TOTAL: HEPATITIS B CORE TOTAL ANTIBODY: NONREACTIVE

## 2021-04-07 ENCOUNTER — Telehealth: Admit: 2021-04-07 | Discharge: 2021-04-08 | Payer: MEDICARE

## 2021-04-07 NOTE — Unmapped (Signed)
[facility]  Comprehensive Cancer Support Program  Established Patient   TELEPHONE ENCOUNTER    THIS IS A NON-BILLABLE ENCOUNTER        Patient:  Brooke [name] [id number]     Service Date:  04/07/2021     Consulting provider:  Perry Mount, [place]    Reason for contact:  Depression and Anxiety        Encounter Description: This encounter was conducted via telephone, which was appropriate and reasonable in the setting of State of Emergency due to COVID-19 pandemic and given the patient's presentation at the time.  The patient was physically located in [place].     The patient and/or parent/guardian has been advised of the potential risks and limitations of this mode of treatment and has agreed to be treated using telemedicine. The patient's/patient's family's questions regarding telemedicine have been answered.      If the visit was completed in an ambulatory setting, the patient and/or parent/guardian has also been advised to contact their provider's office for worsening conditions, and seek emergency medical treatment and/or call 911 if the patient deems either necessary.      Assessment:  Brooke [name] is a 65 y.o. 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 She also has long h/o depression and anxiety for which she is prescribed venlafaxine and clonazepam. She is followed by Fanny Skates, MD, for medication management. She was seen today for reestablish supportive counseling with [facility].    Risk Assessment:  There were no concerns for patient safety identified during the interview. While future psychiatric events cannot be accurately predicted, the patient does not currently require  acute inpatient psychiatric care and does not currently meet [facility] involuntary commitment criteria.           Plan:  Pt was offered ongoing supportive counseling and a follow-up appointment was scheduled.    Subjective:   Brooke [name] is a 65 yo woman with breast cancer metastatic to lung and brain. She reported she will soon begin a new chemotherapy regimen. She stated she has stopped drinking and feels she is coping well. She hopes to begin to focus on smoking cessation. Her mood seemed bright today.     Provided active listening and therapeutic use of silence. Validated pt???s thoughts and feelings and normalized as appropriate. Offered ongoing supportive counseling and scheduled a follow-up appointment.      Objective:    Mental Status Exam:  Speech/Language:    Normal rate, volume, tone, fluency   Mood:   Euthymic   Thought process and Associations:   Logical, linear, clear, coherent, goal directed   Abnormal/psychotic thought content:     Denies SI, HI, self harm, delusions, obsessions, paranoid ideation, or ideas of reference   Perceptual disturbances:     Does not endorse auditory or visual hallucinations     Orientation:   Oriented to person, place, time, and general circumstances   Insight:     Intact   Judgment:    Intact   Impulse Control:   Intact     Time spent:  25 minutes        Perry Mount, MS  Counselor  Comprehensive Cancer Support Program  [phone] / [phone]      THIS IS A NON-BILLABLE ENCOUNTER

## 2021-04-07 NOTE — Unmapped (Signed)
[facility], [place]        Referring Provider:  Olivia Mackie, MD  [address]  [id number]  [place],  [place] [address]     Primary Care Provider:  [name], MD    Other Specialist(s):         PATIENT PROFILE:        Brooke [name] is a 65 y.o. female (DOB: 11/17/1956) who is seen in consultation at the request of Dr. [name] for evaluation of abnormal LFTs in the setting of chemotherapy for metastatic breast cancer.         ASSESSMENT:        65 year old with mild elevations in AST/ALT over past year with moderate Alk phos elevation.      Patient likely has prevalent steatohepatitis from a combination of ETOH use (greater in past) and some metabolic syndrome risk factors.  Rapid weight fluctuations (both gaining and losing weight can contribute to steatosis as well).  Will need to exclude PBC with alkphos elevation and no ductal dilatation on imaging, although there is possibility of bone mets leading to alk phos elevation.  Of course DILI from chemo remains a possibility.  Normal liver synthetic function      PLAN:         -labs today  -fibroscan to assess scar tissue and steatosis  -mild/moderate LFT elevations should not be impediment to cancer therapy at this point.  -smoking cessation, continue ETOH abstinence  -rtc 3 months      CHIEF COMPLAINT: Abnormal LFTs    HISTORY OF PRESENT ILLNESS: This is a 65 y.o. year old female with metastatic breast cancer (lung/brain) who has had abnormal LFTs x ~ year with slowly increasing values in bothe transaminases and alk phos.  No liver specific symtpoms.  Has a prior history of heavy etoh use and also has some MetS risk factors (lipids and HTN)    *  REVIEW OF SYSTEMS:     The balance of 12 systems reviewed is negative except as noted in the HPI.     PAST MEDICAL HISTORY:    Past Medical History:   Diagnosis Date   ??? Abnormal ECG unsure   ??? Alcoholism (CMS-HCC)    ??? Allergic rhinitis    ??? Anemia    ??? Anxiety     insomnia   ??? Arthritis not sure   ??? Brain concussion    ??? Breast cancer (CMS-HCC)     chemo for now with mets   ??? COPD (chronic obstructive pulmonary disease) (CMS-HCC)    ??? Depression    ??? Dysphagia    ??? Genitourinary disease    ??? GERD (gastroesophageal reflux disease)    ??? Headache    ??? HTN (hypertension)    ??? Hyperlipidemia    ??? Insomnia    ??? 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 Stroke (CMS-HCC) Nov. 2020    Dr. Theodoro Kalata   ??? Tobacco dependence        PAST SURGICAL HISTORY:    Past Surgical History:   Procedure Laterality Date   ??? CESAREAN SECTION     ??? CHOLECYSTECTOMY     ??? FINGER SURGERY Right     trigger finger   ??? GANGLION CYST EXCISION Left     hand   ??? HYSTERECTOMY     ??? LYMPH NODE BIOPSY Right 02/08/2016    Axillary   ??? PR BRNSCHSC TNDSC  EBUS DX/TX INTERVENTION PERPH LES Left 03/05/2020    Procedure: Bronch, Rigid Or Flexible, Including Fluoro Guidance, When Performed; With Transendoscopic Ebus During Bronchoscopic Diagnostic Or Therapeutic Intervention(S) For Peripheral Lesion(S);  Surgeon: Jerelyn Charles, MD;  Location: MAIN OR [facility];  Service: Pulmonary   ??? PR BRONCHOSCOPY,COMPUTER ASSIST/IMAGE-GUIDED NAVIGATION Left 03/05/2020    Procedure: BRONCHOSCOPY, RIGID OR FLEXIBLE, INCLUDE FLUORO WHEN PERFORMED; W/COMPUTER-ASSIST, IMAGE-GUIDED NAVIGATION;  Surgeon: Jerelyn Charles, MD;  Location: MAIN OR [facility];  Service: Pulmonary   ??? PR BRONCHOSCOPY,DIAGNOSTIC W LAVAGE Left 03/05/2020    Procedure: Bronchoscopy, Rigid Or Flexible, Include Fluoroscopic Guidance When Performed; W/Bronchial Alveolar Lavage;  Surgeon: Jerelyn Charles, MD;  Location: MAIN OR [facility]  Service: Pulmonary   ??? PR BRONCHOSCOPY,TRANSBRON ASPIR BX Left 03/05/2020    Procedure: Bronchoscopy, Rigid/Flex, Incl Fluoro; W/Transbronch Ndl Aspirat Bx, Trachea, Main Stem &/Or Lobar Bronchus;  Surgeon: Jerelyn Charles, MD;  Location: MAIN OR [facility];  Service: Pulmonary   ??? PR BRONCHOSCOPY,TRANSBRONCH BIOPSY Left 03/05/2020    Procedure: Bronchoscopy, Rigid/Flexible, Include Fluoro Guidance When Performed; W/Transbronchial Lung Bx, Single Lobe;  Surgeon: Jerelyn Charles, MD;  Location: MAIN OR [facility];  Service: Pulmonary   ??? PR CATH PLACE/CORON ANGIO, IMG SUPER/INTERP,W LEFT HEART VENTRICULOGRAPHY N/A 05/07/2020    Procedure: Left Heart Catheterization;  Surgeon: Lesle Reek, MD;  Location: [facility] CATH;  Service: Cardiology   ??? PR COLONOSCOPY W/BIOPSY SINGLE/MULTIPLE N/A 10/21/2016    Procedure: COLONOSCOPY, FLEXIBLE, PROXIMAL TO SPLENIC FLEXURE; WITH BIOPSY, SINGLE OR MULTIPLE;  Surgeon: Monte Fantasia, MD;  Location: GI PROCEDURES [facility];  Service: Gastroenterology   ??? PR EXCIS SUPRATENT BRAIN TUMOR Right 01/15/2019    Procedure: CRANIECTOMY; EXC BRAIN TUMOR-SUPRATENTORIAL;  Surgeon: Edison Simon, MD;  Location: MAIN OR [facility];  Service: Neurosurgery   ??? PR INCISE FINGER TENDON SHEATH Left 06/17/2019    Procedure: R-20 TENDON SHEATH INCISION (EG, FOR TRIGGER FINGER);  Surgeon: Daisy Lazar, MD;  Location: ASC OR [facility];  Service: Orthopedics   ??? PR MICROSURG TECHNIQUES,REQ OPER MICROSCOPE Right 01/15/2019    Procedure: MICROSURGICAL TECHNIQUES, REQUIRING USE OF OPERATING MICROSCOPE (LIST SEPARATELY IN ADDITION TO CODE FOR PRIMARY PROCEDURE);  Surgeon: Edison Simon, MD;  Location: MAIN OR [facility];  Service: Neurosurgery   ??? PR STEREOTACTIC COMP ASSIST PROC,CRANIAL,INTRADURAL Right 01/15/2019    Procedure: STEREOTACTIC COMPUTER-ASSISTED (NAVIGATIONAL) PROCEDURE; CRANIAL, INTRADURAL;  Surgeon: Edison Simon, MD;  Location: MAIN OR [facility];  Service: Neurosurgery   ??? PR UPPER GI ENDOSCOPY,BIOPSY N/A 10/21/2016    Procedure: UGI ENDOSCOPY; WITH BIOPSY, SINGLE OR MULTIPLE;  Surgeon: Monte Fantasia, MD;  [facility]: [facility];  Service: Gastroenterology   ??? PR UPPER GI ENDOSCOPY,BIOPSY N/A 04/02/2018    Procedure: UGI ENDOSCOPY; WITH BIOPSY, SINGLE OR MULTIPLE;  Surgeon: Wendall Papa, MD;  [facility];  Service: Gastroenterology   ??? SKIN BIOPSY         MEDICATIONS:      Current Outpatient Medications:   ???  albuterol HFA 90 mcg/actuation inhaler, Inhale 2 puffs every six (6) hours as needed for wheezing., Disp: , Rfl:   ???  aluminum-magnesium hydroxide-simethicone 200-200-20 mg/5 mL Susp 80 mL, diphenhydrAMINE 12.5 mg/5 mL Liqd 200 mg, nystatin 100,000 unit/mL Susp 8,000,000 Units, distilled water Liqd 80 mL, lidocaine 2% viscous 2 % Soln 80 mL, Take 5 mL by mouth every six (6) hours as needed. Swish, gargle, spit or swallow if throat issues, Disp: 80 mL, Rfl: 2  ???  capecitabine (XELODA) 500 MG tablet, Take 3 tablets (1,500 mg total) by mouth Two (2) times a day . (Patient taking differently: Take 1,500 mg by mouth Two (2) times a day . Has not started taking yet (new prescription).), Disp: 84 tablet, Rfl: 3  ???  clobetasoL (TEMOVATE) 0.05 % ointment, Apply twice a day to rash on palm until smooth/clear., Disp: 30 g, Rfl: 1  ???  clonazePAM (KLONOPIN) 0.5 MG tablet, Take 0.5 mg daily as needed for anxiety.  Do not exceed 0.5 mg/daily., Disp: 30 tablet, Rfl: 0  ???  diphenoxylate-atropine (LOMOTIL) 2.5-0.025 mg per tablet, Take 1 tablet by mouth 4 (four) times a day as needed for diarrhea., Disp: 30 tablet, Rfl: 1  ???  dorzolamide (TRUSOPT) 2 % ophthalmic solution, Administer 1 drop into the left eye Three (3) times a day., Disp: 10 mL, Rfl: 12  ???  esomeprazole (NEXIUM) 40 MG capsule, Take 1 capsule (40 mg total) by mouth daily., Disp: 90 capsule, Rfl: 3  ???  folic acid (FOLVITE) 1 MG tablet, Take 1 tablet (1 mg total) by mouth daily. (Patient taking differently: Take 1 mg by mouth daily. Patient unsure if she is taking this.), Disp: 100 tablet, Rfl: 3  ???  lisinopriL-hydrochlorothiazide (PRINZIDE,ZESTORETIC) 20-25 mg per tablet, Take 1 tablet by mouth daily. (Patient taking differently: Take 1 tablet by mouth nightly. ), Disp: 90 tablet, Rfl: 3  ???  MAGNESIUM ORAL, Take by mouth daily. OTC, Disp: , Rfl:   ???  MORPhine 100 mg/5 mL (20 mg/mL) concentrated solution, Take 0.5 mL (10 mg total) by mouth every eight (8) hours as needed for pain., Disp: 15 mL, Rfl: 0  ???  naloxone (NARCAN) 4 mg nasal spray, One spray in either nostril once for known/suspected opioid overdose. May repeat every 2-3 minutes in alternating nostril til EMS arrives, Disp: 2 each, Rfl: 0  ???  ondansetron (ZOFRAN-ODT) 8 MG disintegrating tablet, Take 1 tablet (8 mg total) by mouth every twelve (12) hours as needed for nausea. (Patient taking differently: Take 8 mg by mouth every twelve (12) hours as needed for nausea. Patient has not started taking yet (will take with chemo)), Disp: 30 tablet, Rfl: 1  ???  potassium chloride (KLOR-CON) 10 MEQ CR tablet, Take 2 tablets (20 mEq total) by mouth daily. (Patient taking differently: Take 10 mEq by mouth Two (2) times a day. ), Disp: 60 tablet, Rfl: 2  ???  pregabalin (LYRICA) 200 MG capsule, Take 1 capsule (200 mg total) by mouth Two (2) times a day., Disp: 60 capsule, Rfl: 2  ???  prochlorperazine (COMPAZINE) 10 MG tablet, Take 1 tablet (10 mg total) by mouth every six (6) hours as needed for nausea., Disp: 30 tablet, Rfl: 6  ???  saliva stimulant comb. no.3 (BIOTENE MOISTURIZING MOUTH) Spry, Take 1 spray by mouth every two (2) hours as needed (dry mouth)., Disp: 44.3 mL, Rfl: prn  ???  senna (SENOKOT) 8.6 mg tablet, Take 3 tablets by mouth daily., Disp: , Rfl:   ???  thiamine HCl (VITAMIN B-1 ORAL), Take by mouth daily., Disp: , Rfl:   ???  traZODone (DESYREL) 50 MG tablet, Take 1.5 tablets (75 mg total) by mouth nightly., Disp: 45 tablet, Rfl: 1  ???  tucatinib (TUKYSA) 150 mg tablet, Take 2 tablets (300 mg total) by mouth two (2) times a day . (Patient taking differently: Take 300 mg by mouth two (2) times a day . Has not started taking yet (new prescription)), Disp: 120  tablet, Rfl: 2  ???  venlafaxine (EFFEXOR-XR) 150 MG 24 hr capsule, Take 150 mg capsule in conjunction with 75 mg capsule for a total of 225 mg by mouth daily, Disp: 30 capsule, Rfl: 2  ???  venlafaxine (EFFEXOR-XR) 75 MG 24 hr capsule, Take 75 mg capsule in conjunction with 150 mg capsule by mouth daily for a total of 225 mg by mouth daily, Disp: 30 capsule, Rfl: 2  ???  diclofenac sodium (VOLTAREN) 1 % gel, Apply 2 g topically four (4) times a day as needed for arthritis or pain. (Patient not taking: Reported on 04/06/2021), Disp: 150 g, Rfl: 2  ???  lasmiditan 50 mg Tab, Take 50 mg by mouth once as needed (Once daily in 24hr period PRN migraine) for up to 1 dose. (Patient not taking: Reported on 03/29/2021), Disp: 7 tablet, Rfl: 1  ???  nicotine (NICODERM CQ) 21 mg/24 hr patch, Place 1 patch on the skin daily. (Patient not taking: Reported on 03/29/2021), Disp: 28 patch, Rfl: 2  ???  nicotine polacrilex (NICORETTE) 4 MG gum, Apply 1 each (4 mg total) to cheek every hour as needed for smoking cessation. (Patient not taking: Reported on 03/29/2021), Disp: 110 each, Rfl: 2  ???  polyethylene glycol (MIRALAX) 17 gram/dose powder, Take 17 g by mouth Three (3) times a day as needed (constipation). (Patient not taking: Reported on 04/06/2021), Disp: 850 g, Rfl: 3    ALLERGIES:    Adhesive, Decadron [dexamethasone], Morphine, Tetracycline, Oxycodone, and Tegaderm adhesive-no drug-allergy check    SOCIAL HISTORY:    Social History     Socioeconomic History   ??? Marital status: Divorced     Spouse name: None   ??? Number of children: None   ??? Years of education: None   ??? Highest education level: None   Occupational History   ??? None   Tobacco Use   ??? Smoking status: Current Every Day Smoker     Packs/day: 1.00     Years: 40.00     Pack years: 40.00     Types: Cigarettes   ??? Smokeless tobacco: Never Used   ??? Tobacco comment: 10-20 single    Vaping Use   ??? Vaping Use: Never used   Substance and Sexual Activity   ??? Alcohol use: Not Currently     Alcohol/week: 0.0 standard drinks     Comment: Have stopped about 6 months ago   ??? Drug use: No   ??? Sexual activity: Yes     Partners: Male Birth control/protection: Post-menopausal   Other Topics Concern   ??? Exercise No   ??? Living Situation Yes   ??? Do you use sunscreen? No   ??? Tanning bed use? No   ??? Are you easily burned? Not Asked   ??? Excessive sun exposure? Not Asked   ??? Blistering sunburns? Not Asked   Social History Narrative    She lives in senior housing in [place] x2 months. he has 4 sisters, 2 nearby and involved in care. She has 1 son-[name], recently married 39 yo, who lives about an hour and a half away in [place]. He works full-time as a Sales executive. She is still a chronic smoker (1ppd). She is currently divorced.         Goes to [employer] in [place] and gets a lot of support. Her church friend [name] and her sisters goes with her to chemo appts.     Social Determinants of Health  Financial Resource Strain: Medium Risk   ??? Difficulty of Paying Living Expenses: Somewhat hard   Food Insecurity: Food Insecurity Present   ??? Worried About Programme researcher, broadcasting/film/video in the Last Year: Sometimes true   ??? Ran Out of Food in the Last Year: Sometimes true   Transportation Needs: Not on file   Physical Activity: Not on file   Stress: Not on file   Social Connections: Not on file       FAMILY HISTORY:    family history includes COPD in her mother; Cancer in her father, maternal grandfather, maternal uncle, maternal uncle, and paternal aunt; Diabetes in her sister, sister, and sister; Heart attack in her brother, father, and sister; Heart disease in her brother, maternal grandmother, and sister; Heart failure in her brother, father, and sister; Hypertension in her brother, mother, sister, sister, sister, and sister; No Known Problems in her maternal aunt, paternal aunt, paternal grandfather, paternal grandmother, paternal uncle, and another family member.      VITAL SIGNS:    BP 136/64 (BP Site: L Arm, BP Position: Sitting)  - Pulse 80  - Temp 37 ??C (98.6 ??F) (Tympanic)  - Ht 159 cm (5' 2.6)  - Wt 62 kg (136 lb 11.2 oz)  - LMP  (LMP Unknown)  - SpO2 96%  - BMI 24.53 kg/m??   Body mass index is 24.53 kg/m??.    PHYSICAL EXAM:    Normal comprehensive exam:      Constitutional:   Alert, oriented x 3, no acute distress, well nourished   Mental Status:   Thought organized, appropriate affect, normal fluent speech.   HEENT:   PEERL, conjunctiva clear, anicteric, oropharynx clear, neck supple, no LAD.   Respiratory: Clear to auscultation, and percussion to the bases, unlabored breathing.     Cardiac: Regular rate and rhythm normal S1 and S2, no murmur.      Abdomen: Soft, non-distended, non-tender, no organomegaly or masses.     Perianal/Rectal Exam Not performed.     Extremities:   No edema, well perfused.   Musculoskeletal: No joint swelling or tenderness noted, no deformities.     Skin: No rashes, jaundice or skin lesions noted.     Neuro: No focal deficits.          DIAGNOSTIC STUDIES:  I have reviewed all pertinent diagnostic studies, including:      Radiographic studies:    reviewed  Laboratory results:    Office Visit on 04/06/2021   Component Date Value Ref Range Status   ??? PT 04/06/2021 10.6  10.3 - 13.4 sec Final   ??? INR 04/06/2021 0.91   Final   ??? WBC 04/06/2021 5.4  3.6 - 11.2 10*9/L Final   ??? RBC 04/06/2021 4.39  3.95 - 5.13 10*12/L Final   ??? HGB 04/06/2021 14.4  11.3 - 14.9 g/dL Final   ??? HCT 60/45/4098 43.1  34.0 - 44.0 % Final   ??? MCV 04/06/2021 98.3 (A) 77.6 - 95.7 fL Final   ??? MCH 04/06/2021 32.7 (A) 25.9 - 32.4 pg Final   ??? MCHC 04/06/2021 33.3  32.0 - 36.0 g/dL Final   ??? RDW 11/91/4782 13.4  12.2 - 15.2 % Final   ??? MPV 04/06/2021 8.2  6.8 - 10.7 fL Final   ??? Platelet 04/06/2021 110 (A) 150 - 450 10*9/L Final   ??? Sodium 04/06/2021 139  135 - 145 mmol/L Final   ??? Potassium 04/06/2021 3.0 (A) 3.4 - 4.8 mmol/L Final   ???  Chloride 04/06/2021 107  98 - 107 mmol/L Final   ??? Anion Gap 04/06/2021 8  5 - 14 mmol/L Final   ??? CO2 04/06/2021 24.0  20.0 - 31.0 mmol/L Final   ??? BUN 04/06/2021 <5 (A) 9 - 23 mg/dL Final   ??? Creatinine 04/06/2021 0.62  0.60 - 0.80 mg/dL Final   ??? EGFR CKD-EPI Non-African American,* 04/06/2021 >90  >=60 mL/min/1.7m2 Final   ??? EGFR CKD-EPI African American, Fem* 04/06/2021 >90  >=60 mL/min/1.36m2 Final   ??? Glucose 04/06/2021 109  70 - 179 mg/dL Final   ??? Calcium 16/09/9603 9.7  8.7 - 10.4 mg/dL Final   ??? Albumin 54/08/8118 3.8  3.4 - 5.0 g/dL Final   ??? Total Protein 04/06/2021 7.6  5.7 - 8.2 g/dL Final   ??? Total Bilirubin 04/06/2021 0.6  0.3 - 1.2 mg/dL Final   ??? AST 14/78/2956 100 (A) <=34 U/L Final   ??? ALT 04/06/2021 64 (A) 10 - 49 U/L Final   ??? Alkaline Phosphatase 04/06/2021 505 (A) 46 - 116 U/L Final   ??? Hepatitis C Ab 04/06/2021 Nonreactive  Nonreactive Final   ??? Hep B Core Total Ab 04/06/2021 Nonreactive  Nonreactive Final   ??? Hep B S Ab 04/06/2021 Nonreactive  Nonreactive, [place] Final   ??? Hep B Surf Ab Quant 04/06/2021 <8.00  <8.00 m(IU)/mL Final   ??? Hep B Surface Ag 04/06/2021 Nonreactive  Nonreactive Final

## 2021-04-08 DIAGNOSIS — R110 Nausea: Principal | ICD-10-CM

## 2021-04-08 DIAGNOSIS — C50811 Malignant neoplasm of overlapping sites of right female breast: Principal | ICD-10-CM

## 2021-04-08 DIAGNOSIS — K900 Celiac disease: Principal | ICD-10-CM

## 2021-04-08 DIAGNOSIS — C50919 Malignant neoplasm of unspecified site of unspecified female breast: Principal | ICD-10-CM

## 2021-04-09 LAB — ANTIMITOCHONDRIAL ANTIBODY
ANTI-MITOCHONDRIAL ANTIBODY: NEGATIVE
ANTI-MITOCHONDRIAL LEVEL: 15

## 2021-04-10 NOTE — Unmapped (Addendum)
Cardiopulmonary exercise test interpretation      Date: 02/03/21        Name: Brooke [name]   MRN: [id number]   Age/sex: 65 y.o. female   Height: 159 cm  Weight: 62 kg  BMI: 24.5    Referring physician: Christen Butter    Interpreting physicians:   Bonney Leitz, MD   Indication for study: DOE   Clinical history: 65 year-old female with DOE. Spirometry with no obstruction or response to BD. Lung volumes with mild restriction (normal MIP/MEP) and DLCO moderately reduced. VQ (2021) with patchy radiotracer uptake defects (?related to emphysema vs PE). ECHO with grade 2 diastolic dysfunction, RVSP 49 mmHg (normal RV size and function). LHC with normal coronaries and normal LVEDP (9 mmHg). CT Chest significant for mild emphysema and pulmonary nodules.      Study description:  The patient performed a graded cardiopulmonary exercise test on a cycle ergometer at the 5 Watt protocol. The patient exercised for 10 minutes total. (Goal 8 to 12 minutes).  Exercise study was terminated because of leg fatigue, dizziness, and SOB.    Other symptoms that the patient reported: N/A.  The patient demonstrated satisfactory cooperation and satisfactory effort.  Notable events that occurred during the CPET: N/A.    Work and exercise capacity  The patient exercised to a peak work capacity of 33 Watts (38% predicted). (Normal: > 85% predicted)  Oxygen consumption at peak exercise (VO2) was 0.85 L/min (49% predicted). (Normal: > 85% predicted)  Oxygen consumption per kg at peak exercise was 13.3 ml/kg/min (53% predicted). (Normal: > 85% predicted)    Respiratory mechanics  Baseline spirometry (date 02/03/21): FVC 2.07 L  (72% predicted)  FEV1 1.5L (68% predicted)  Measured maximal voluntary ventilation (MVV) was 79 L/min (93% predicted)    Minute ventilation at peak exercise was 32 L/min (40% MVV). (Normal: < 70% of MVV).  Respiratory rate at peak exercise was 30 bpm. (Normal: < 50bpm).  Tidal volume at at peak exercise was 1 L. This was 50 % of FVC, and 2 times resting tidal volume. (Normal: 50-60% of FVC, and >2 times resting tidal volume).  Breathing reserve normal.      Anaerobic threshold  The patient reached anaerobic threshold at VO2 could not be determined     Cardiovascular response  Heart rate at peak exercise was 119 (75% predicted maximal heart rate). (Normal: >85% predicted maximal heart rate).  Heart rate recovery was 5 at one minute.  ECG during exercise demonstrated: sinus tachycardia; no arrhythmias or ST-T changes were noted.  Blood pressure changes during exercise: 142/89 at rest to 157/78 at peak exercise. (Normal:  SBP < 220 and DBP < 100, with no fall during exercise).  Oxygen pulse at peak exercise was 7.2 ml/beat (85% predicted). (Normal: > 80% predicted).     Gas exchange  Measured respiratory exchange ratio (RER) at peak exercise was 1.02. (Expected RER > 1.09).  Arterial blood gas was performed at rest and again 1 minutes into recovery.   Resting ABG results: pH 7.46 / PCO2 32 / PaO2 75. Lactate 1.4. Hemoglobin 13.6.  Exercise ABG results: pH 7.39 / PCO2 32 / PaO2 89. Lactate 5.5.    Oxygen saturation (SpO2) during the exercise study was 89%.  Alveolar arterial oxygen difference was 37 at rest and was 31 at peak exercise. (Normal: <49mmHg).      VE/VCO2 slope was 36. (Normal: < 34).  Dead space to tidal volume ratio (VD/VT) obtained using  the Bohr equation and ABG data was 0.32 at rest and changed to 0.21 with peak exercise. (Normal: <0.40 at rest and decreases to <0.25 with peak exercise).  End tidal CO2 was 28 at rest and 33 at peak exercise.  End tidal to arterial CO2 difference at peak exercise was 1 mmHg. (Normal: > 0 mmHg).          Interpretation  Study limitations: inability to determine AT threshold.    The CPET was a submaximal study.    Maximal effort was demonstrated by blood lactate, RER and peak HR.  Overall exercise capacity (by work and peak VO2) was abnormal.  Pulmonary mechanics were normal as evidenced by normal peak RR, MVV, and breathing reserve.  Anaerobic threshold was couldn't be determined.  Cardiovascular response to exercise was abnormal as evidenced by low peak HR.  Gas exchange was abnormal as evidenced by wide A-a difference and ventilatory inefficiency.    NOTES:  Patient exhibits moderately reduced exercise capacity with abnormal VO2 max and work capacity. Respiratory parameters were within normal limits. Cardiovascular response was abnormal as the patient did not achieve 85% of expected HR at peak exercise which could be related to submaximal effort, AV blockade or chronotropic incompetence. Additionally, gas exchange parameters were abnormal with wide A-a difference and evidence of ventilatory inefficiency (VE/VCO2 slope >34). Although this was a submaximal study, the above mentioned abnormalities can be related to pulmonary vascular disease.          Tomara Youngberg O. Wellington Hampshire, MD   Assistant Professor of Medicine  Pulmonary and Critical Care Medicine  [facility] Multidisciplinary Sarcoidosis Program  [facility] Pulmonary Hypertension Program

## 2021-04-10 NOTE — Unmapped (Signed)
Addended by: Christen Butter on: 04/09/2021 07:25 PM     Modules accepted: Orders

## 2021-04-12 ENCOUNTER — Ambulatory Visit: Admit: 2021-04-12 | Discharge: 2021-04-13 | Payer: MEDICARE

## 2021-04-12 ENCOUNTER — Other Ambulatory Visit: Admit: 2021-04-12 | Discharge: 2021-04-13 | Payer: MEDICARE

## 2021-04-12 MED ADMIN — trastuzumab (HERCEPTIN) 500 mg in sodium chloride (NS) 0.9 % 250 mL IVPB: 8 mg/kg | INTRAVENOUS | @ 17:00:00 | Stop: 2021-04-12

## 2021-04-12 MED ADMIN — sodium chloride (NS) 0.9 % infusion: 100 mL/h | INTRAVENOUS | @ 16:00:00

## 2021-04-12 MED ADMIN — heparin, porcine (PF) 100 unit/mL injection 500 Units: 500 [IU] | INTRAVENOUS | @ 18:00:00 | Stop: 2021-04-13

## 2021-04-12 NOTE — Unmapped (Unsigned)
Port accessed by [employer].

## 2021-04-12 NOTE — Unmapped (Signed)
No visits with results within 1 Day(s) from this visit.   Latest known visit with results is:   Office Visit on 04/06/2021   Component Date Value Ref Range Status   ??? PT 04/06/2021 10.6  10.3 - 13.4 sec Final   ??? INR 04/06/2021 0.91   Final   ??? WBC 04/06/2021 5.4  3.6 - 11.2 10*9/L Final   ??? RBC 04/06/2021 4.39  3.95 - 5.13 10*12/L Final   ??? HGB 04/06/2021 14.4  11.3 - 14.9 g/dL Final   ??? HCT 96/29/5284 43.1  34.0 - 44.0 % Final   ??? MCV 04/06/2021 98.3 (A) 77.6 - 95.7 fL Final   ??? MCH 04/06/2021 32.7 (A) 25.9 - 32.4 pg Final   ??? MCHC 04/06/2021 33.3  32.0 - 36.0 g/dL Final   ??? RDW 13/24/4010 13.4  12.2 - 15.2 % Final   ??? MPV 04/06/2021 8.2  6.8 - 10.7 fL Final   ??? Platelet 04/06/2021 110 (A) 150 - 450 10*9/L Final   ??? Sodium 04/06/2021 139  135 - 145 mmol/L Final   ??? Potassium 04/06/2021 3.0 (A) 3.4 - 4.8 mmol/L Final   ??? Chloride 04/06/2021 107  98 - 107 mmol/L Final   ??? Anion Gap 04/06/2021 8  5 - 14 mmol/L Final   ??? CO2 04/06/2021 24.0  20.0 - 31.0 mmol/L Final   ??? BUN 04/06/2021 <5 (A) 9 - 23 mg/dL Final   ??? Creatinine 04/06/2021 0.62  0.60 - 0.80 mg/dL Final   ??? EGFR CKD-EPI Non-African American,* 04/06/2021 >90  >=60 mL/min/1.73m2 Final   ??? EGFR CKD-EPI African American, Fem* 04/06/2021 >90  >=60 mL/min/1.85m2 Final   ??? Glucose 04/06/2021 109  70 - 179 mg/dL Final   ??? Calcium 27/25/3664 9.7  8.7 - 10.4 mg/dL Final   ??? Albumin 40/34/7425 3.8  3.4 - 5.0 g/dL Final   ??? Total Protein 04/06/2021 7.6  5.7 - 8.2 g/dL Final   ??? Total Bilirubin 04/06/2021 0.6  0.3 - 1.2 mg/dL Final   ??? AST 95/63/8756 100 (A) <=34 U/L Final   ??? ALT 04/06/2021 64 (A) 10 - 49 U/L Final   ??? Alkaline Phosphatase 04/06/2021 505 (A) 46 - 116 U/L Final   ??? Hepatitis C Ab 04/06/2021 Nonreactive  Nonreactive Final    Antibodies to HCV were not detected.  A nonreactive result does not exclude the possibility of exposure to HCV.   ??? Hep B Core Total Ab 04/06/2021 Nonreactive  Nonreactive Final   ??? Hep B S Ab 04/06/2021 Nonreactive Nonreactive, Grayzone Final    Nonreactive and Grayzone results are considered non-immune.   ??? Hep B Surf Ab Quant 04/06/2021 <8.00  <8.00 m(IU)/mL Final   ??? Hep B Surface Ag 04/06/2021 Nonreactive  Nonreactive Final   ??? Smooth Muscle Ab 04/06/2021 Negative  Negative Final    Negative: No further testing will be performed     -------------------ADDITIONAL INFORMATION-------------------  This test was developed and its performance characteristics   determined by [facility] in a manner consistent with CLIA   requirements. This test has not been cleared or approved by   the [place].S. Food and Drug Administration.     Test Performed by:  [facility]  [address] [place], [place], [place] [address]  Lab Director: Paul Dykes M.D. Ph.D.; CLIA# [id number]   ??? Anti Mitochondrial Ab 04/06/2021 Negative  Negative Final   ??? Anti-Mitochondrial Level 04/06/2021 15.0   Final

## 2021-04-12 NOTE — Unmapped (Signed)
Patient arrived to chair 14.  No complaints noted.  Access of port intact with blood return. Due to this being C1D1 infusion, infusion pharmacist paged for patient teaching and medication review. Patient completed and tolerated treatment. Port de-accessed after 500 unit Heparin flush, site covered with band-aid dressing. AVS printed. Pt discharged to home, NAD.

## 2021-04-15 ENCOUNTER — Ambulatory Visit: Admit: 2021-04-15 | Discharge: 2021-04-16 | Payer: MEDICARE

## 2021-04-15 DIAGNOSIS — R945 Abnormal results of liver function studies: Principal | ICD-10-CM

## 2021-04-15 NOTE — Unmapped (Signed)
[place] DIV[facility] OF GASTROENTEROLOGY AND HEPATOLOGY  [facility]    FIBROSCAN will be performed to assess hepatic fibrosis (scarring) in order to stage this patient's liver disease. This will assist with evaluating the natural course of the disease and will provide important information regarding prognosis, duration of therapy, and potential response to treatment. This information will also help assess risk for hepatocellular carcinoma and need for liver cancer surveillance.    FibroscanProcedure:  After obtaining verbal consent, the patient was placed in a supine position. Physical characteristics, body habitus, and landmarks were assessed to establish appropriate midaxillary intercostal space for probe placement.    FibroScan 630 Expert Serial # [id number]    Probe  [x]  M+ Serial # [id number]              []  XL+ Serial # [id number]      Main Etiology of Liver Disease:  [] HCV       [] HBV      [] Alcohol      [] NAFLD     [] PBC       [] PSC      [x] Elevated hepatic enzymes       [] Other                                      Skin to liver capsule distance and liver parenchyma were accessed during the entire examination with the FibroScan probe. The patient was instructed to breathe normally and to abstain from sudden movements during the procedure, allowing for random measurements of liver stiffness. 50Hz  shear wave pulses were applied and the resulting shear wave and propagation speed detected with a 3.5 MHz ultrasonic signal, using the FibroScan probe.  At least ten Shear Waves were produced; individual measurements of each shear wave were calculated.  Patient tolerated the procedure well and was discharged without incident.      FibroScan Score: 13.5 kPa    IQR/MED: 11%    CAP: 201 dB/m     Test performed by: Janene Madeira, RN        [place] [facility] OF GASTROENTEROLOGY AND HEPATOLOGY  [place]      RESULTS REPORT - Physician???s interpretation    Estimation of the stage of liver fibrosis (Metavir Score): The results of the Liver Stiffness Score are consistent with the following liver fibrosis stage:                     F4              GENERAL RECOMMENDATIONS ACCORDING TO THE STAGE OF LIVER FIBROSIS.    F0-F1: No-minimal fibrosis. The risk of progression to advanced fibrosis and cirrhosis is low. If the cause of liver disease is not removed, a 1-2 yr follow-up study is recommended.   F2: Significant fibrosis. There is a moderate risk of progression to cirrhosis. If the cause of liver disease is not removed, a follow-up study in 12 months is recommended.  F3: Advanced (pre-cirrhotic stage). The risk of progression to cirrhosis is high. Imaging studies to rule out hepatocellular carcinoma should be considered. Efforts to remove the cause of liver disease are highly recommended.  F4: Cirrhosis. There is significant risk of portal hypertension and esophageal varices. An upper endoscopy is recommended. Imaging studies for hepatocellular carcinoma screening are recommended.    Any and all FibroScan studies must be carefully evaluated, taking fully into account all individual  measurement/scans, patient history and other factors. As with liver biopsy, any estimation of liver fibrosis may be subject to under or over staging due to sampling error. Any further medical or surgical intervention should be made only while fully considering the circumstances of this patient and in consultation with this patient.    I have reviewed and interpreted the FIBROSCAN test results as described above.     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 Yolonda Kida, MD, [facility]  Professor of Medicine  Division of Gastroenterology and Hepatology  [facility]  [address]  [id number]  [place], [place] [address]    Phone [phone]  Fax [phone]

## 2021-04-19 NOTE — Unmapped (Signed)
[facility] returned call to discuss patient requesting in home support    Please contact Annette at [phone]

## 2021-04-20 DIAGNOSIS — C50811 Malignant neoplasm of overlapping sites of right female breast: Principal | ICD-10-CM

## 2021-04-20 DIAGNOSIS — Z170 Estrogen receptor positive status [ER+]: Principal | ICD-10-CM

## 2021-04-20 MED ORDER — PROCHLORPERAZINE MALEATE 10 MG TABLET
ORAL_TABLET | Freq: Four times a day (QID) | ORAL | 6 refills | 8 days | Status: CP | PRN
Start: 2021-04-20 — End: ?

## 2021-04-20 NOTE — Unmapped (Signed)
Pharmacist Phone Follow-Up    Cancer Team  Medical Oncology: Dr. [name]  Reason for call: Oral chemotherapy management  Current treatment: Trastuzumab + tucatininb + capecitabine     Assessment/Plan  Breast cancer:  Ms. Wagenaar is a 65 yo woman with HER2+ metastatic breast cancer who started trastuzumab, tucatinib, and capecitabine on 4/18.  I spoke to her sister, [name], for one week follow-up.  [name] reports that her sister is having multiple adverse effects including nausea, diarrhea, mucositis, and sore hands and feet.  Symptom management were reviewed.  Sister is to contact Team if side effects worsen.     Plan:  1. Continue capecitabine and tucatininb schedule and doses  2. Increase lotion to hands and feet to 3 times daily  3. Increase salt water mouth rinses to 3-4 times daily  4. Continue ondansetron BID prior to capecitabine doses.  Prescription for prochlorperazine sent for breakthrough nausea  5. Use Imodium if needed for diarrhea  6. CPP to follow-up in one week  ________________________________________________________________________     Interval History   Ms.Rando is a 65 y.o. female with metastatic breast cancer started IV trastuzumab and oral capecitabine and tucatininb one week ago (04/12/21).  I spoke to her sister, [name], today.  [name] reports that her sister is having sore hands and feet, occasional loose stools, daily nausea (despite taking ondnasetron BID) and may be developing mouth sores.  I reviewed management of these symptoms, as below.  Sister instructed to contact team if her sister's symptoms worsen.    Oral chemotherapy regimen:??  ????????????????????????Tucatinib 300 mg po BID continuous  ????????????????????????Capecitabine 1500 mg BID x 14 days then 7 days off  ????????????????????????Trastuzumab IV q 3weeks  Start date: 04/12/21  Pharmacy: [facility]     Adherence: Not able to assess    Adverse Effects:  1. Nausea - Taking ondansetron BID prior to taking capecitabine.  She is still having nausea in the middle of the day.  Prescription for prochlorperazine sent to home pharmacy to use for breakthrough N/V.  2. Mucositis - Patient has one mouth sore.  I recommended that she increase the use of salt water mouth rinses from 2 times a day to 3-4 times daily  3. Hand/foot syndrome - Patient having soreness in her hands and feet.  Is not having trouble doing tasks or walking.  I encouraged liberal use of lotion on her hands and feet  4. Diarrhea - Patient having occasional loose stool.  I reviewed use of Imodium that she may take Imodium as needed for loose stools.    Drug interactions: Not assessed    Breast Oncology    Breast Oncology Metrics:         Chemotherapy Dose: Dose documented     Chemotherapy Schedule: Schedule documented     NCI CTCAE: Nausea/Vomiting - Grade 1, Diarrhea - Grade 1, Mucositis - Grade 1     Interventions: Additional agent prescribed for side effect management, Education provided         Patient verbalized understanding of the above information.     I spent 20 minutes on the phone with the patient on the date of service. I spent an additional 10 minutes on pre- and post-visit activities.     The patient was physically located in [place] or a state in which I am permitted to provide care. The patient and/or parent/guardian understood that s/he may incur co-pays and cost sharing, and agreed to the telemedicine visit. The visit was  reasonable and appropriate under the circumstances given the patient's presentation at the time.    The patient and/or parent/guardian has been advised of the potential risks and limitations of this mode of treatment (including, but not limited to, the absence of in-person examination) and has agreed to be treated using telemedicine. The patient's/patient's family's questions regarding telemedicine have been answered.     If the visit was completed in an ambulatory setting, the patient and/or parent/guardian has also been advised to contact their provider???s office for worsening conditions, and seek emergency medical treatment and/or call 911 if the patient deems either necessary.      Oncology History Overview Note   Identifying Statement:  Yannis Gumbs is a 65 y.o. female diagnosed with a right posterior frontal lobe metastasis (breast primary) status post resection 01/15/2019 and 5/5 fractions of SBRT (2500 Gy via CyberKnife) to the resection cavity.    Treatment History:  09/05/17: S/p SRS to 7 lesions  01/15/19: S/p resection, followed by SRS 2500 cGy   03/04/19: KPS 80; MRI with postsurgical changes versus residual disease; RTC in 2 weeks w/ MRI   04/15/19: KPS NA; MRI w/ SD; no study; RTC 6 weeks w/ MRI   06/10/19: KPS 80; MRI w/ SD; RTC 4 weeks  07/15/19: KPS 80; MRI w/ SD; RTC 8 weeks  08/19/19: KPS 80; start nortriptyline for HAs; RTC 4 weeks w/ MRI  09/16/19: KPS 80; con't nortriptyline for HAs; RTC 2 mos w/ MRI  11/11/19: KPS 80; MRI w/ SD though w/ small infarct; proceed w/ CVA workup; start 81mg  ASA; encouraged smoking cessation; con't nortriptyline; RTC to discuss results  11/25/19: KPS 80; discussed CVA workup results; MRA unremarkable; con't smoking cessation efforts; con't nortriptyline for HAs; con't 81mg  ASA, start Lipitor; f/u with PCP for COPD eval; RTC 2 mos w/ MRI     Malignant neoplasm of overlapping sites of right female breast (CMS-HCC)   2017 -  Presenting Symptoms    Large open wound RT breast which began as nipple inversion. Patient states that she ignored for a long time.  Physical exam of the area of concern in the RTbreast demonstrates a large open wound in the lateral right breast. Saw PCP 01/27/16 Rx Keflex.     02/04/2016 Interval Scan(s)    MMG/US: 3.1 (3.0) cm irregular spiculated dense mass in lateral Rt breast w nipple and skin retraction. 2 subcentimeter irregular masses in the RUOQ  (10', 11') (possible satellite lesions). Large Rt axillary lymph node 1.7. Lt breast clear. BIRAD 5.     02/08/2016 Biopsy    US guided Rt. Axillary LN biopsy:  Gr 3, IDC with apocrine features. ER (-), PR (31-40), HER-2 (3+). Noted to have multiple skin lesions. Poor access to breast mass due open 10-15 cm wound risk of pain and bleeding.     02/09/2016 Initial Diagnosis    Malignant neoplasm of overlapping sites of right female breast (RAF-HCC)     02/10/2016 Interval Scan(s)    CT CAP: Large, necrotic right breast mass with wide open tract to the skin, consistent with known right breast malignancy.  Numerous enlarged right axillary, subpectoral, mediastinal, bilateral hilar lymph nodes and innumerable bilateral pulmonary nodules     02/10/2016 Interval Scan(s)    NM Bone scan: No osseous metastatic disease.     04/28/2016 - 03/10/2020 Chemotherapy    OP TRASTUZUMAB (EVERY 21 DAYS)  Trastuzumab 8 mg/kg loading then 6 mg/kg every 21 days     07/13/2016 Genetics  STRATA  ??? ERBB2 copy number alteration  Estimated copy number: 40, confidence interval: 35.7 - 44.0, cellularity: 50%  Associated FDA-approved targeted therapies in breast cancer: trastuzumab, lapatinib, ado-trastuzumab emtansine, pertuzumab  ??? PIK3CA [id number]     01/03/2020 -  Cancer Staged    CT CAP 01/03/2020 which showed a new lingular opacity measuring 1.9 cm in the right lung. There was also slight increase in the right breast mass from 1.2-> 1.5 cm.  Bone scan shows no osseous metastases. Overall I considered these findings indeterminate and ppted to repeat CT CAP in 1 month     02/03/2020 -  Cancer Staged    CT chest 02/03/20 which showed that the lingular lesion has persisted and increased in size.  No mediastinal adenopathy reported.  She continues to report a nonproductive cough and mild dyspnea.  She is a chronic smoker and currently smokes on average half a pack per day.  My major concern is that this could be a second primary lung cancer as her other sites of diseases stable on current systemic therapy       02/03/2020 Endocrine/Hormone Therapy    Stop Tamoxifen, Add Fasoldex, continue Q3week Hercpetin 03/23/2020 - 02/09/2021 Chemotherapy    OP BREAST ADO-TRASTUZUMAB EMTANSINE  ado-trastuzumab emtansine 3.6 mg/kg IV on day 1, every 21 days     04/12/2021 -  Chemotherapy    OP TRASTUZUMAB (EVERY 21 DAYS)  trastuzumab 8 mg/kg IV LOADING, then 6 mg/kg IV MAINT, every 21 days     Brain metastases (CMS-HCC)   08/07/2017 Initial Diagnosis    Brain metastases (CMS-HCC)    Summary of Radiosurgery   Rx:7 brain met: 09/05/2017: 2,000/2,000 cGy  Sec:R Frontal: 09/05/2017: 2,000 cGy  Sec:L Post Fr: 09/05/2017: 2,000 cGy  Sec:L Ant Fro: 09/05/2017: 2,000 cGy  Sec:R Sup Oc: 09/05/2017: 2,000 cGy  Sec:L Occipita: 09/05/2017: 2,000 cGy  Sec:R Inf Occi: 09/05/2017: 2,000 cGy  Sec:L Tempor: 09/05/2017: 2,000 cGy  Rx:Lt Med Oc: 09/13/2018: 2,000/2,000 cGy  Rx:Rt Frontal: : 2,500/2,500 cGy    01/15/2019: Craniotomy and resection of right posterior frontal lobe metastasis. Followed by [facility]    03/04/19: KPS 80; MRI with postsurgical changes versus residual disease; RTC in 2 weeks w/ MRI     01/03/2020 -  Cancer Staged    CT CAP 01/03/2020 which showed a new lingular opacity measuring 1.9 cm in the right lung. There was also slight increase in the right breast mass from 1.2-> 1.5 cm.  Bone scan shows no osseous metastases. Overall I considered these findings indeterminate and ppted to repeat CT CAP in 1 month     02/03/2020 -  Cancer Staged    CT chest 02/03/20 which showed that the lingular lesion has persisted and increased in size.  No mediastinal adenopathy reported.  She continues to report a nonproductive cough and mild dyspnea.  She is a chronic smoker and currently smokes on average half a pack per day.  My major concern is that this could be a second primary lung cancer as her other sites of diseases stable on current systemic therapy       02/03/2020 Endocrine/Hormone Therapy    Stop Tamoxifen, Add Fasoldex, continue Q3week Hercpetin     Metastatic breast cancer (CMS-HCC)   01/25/2019 Initial Diagnosis    Metastatic breast cancer (CMS-HCC) 03/23/2020 - 02/09/2021 Chemotherapy    OP BREAST ADO-TRASTUZUMAB EMTANSINE  ado-trastuzumab emtansine 3.6 mg/kg IV on day 1, every 21 days     04/12/2021 -  Chemotherapy  OP TRASTUZUMAB (EVERY 21 DAYS)  trastuzumab 8 mg/kg IV LOADING, then 6 mg/kg IV MAINT, every 21 days          Pertinent Labs:   No visits with results within 1 Day(s) from this visit.   Latest known visit with results is:   Office Visit on 04/06/2021   Component Date Value Ref Range Status   ??? PT 04/06/2021 10.6  10.3 - 13.4 sec Final   ??? INR 04/06/2021 0.91   Final   ??? WBC 04/06/2021 5.4  3.6 - 11.2 10*9/L Final   ??? RBC 04/06/2021 4.39  3.95 - 5.13 10*12/L Final   ??? HGB 04/06/2021 14.4  11.3 - 14.9 g/dL Final   ??? HCT 16/09/9603 43.1  34.0 - 44.0 % Final   ??? MCV 04/06/2021 98.3 (A) 77.6 - 95.7 fL Final   ??? MCH 04/06/2021 32.7 (A) 25.9 - 32.4 pg Final   ??? MCHC 04/06/2021 33.3  32.0 - 36.0 g/dL Final   ??? RDW 54/08/8118 13.4  12.2 - 15.2 % Final   ??? MPV 04/06/2021 8.2  6.8 - 10.7 fL Final   ??? Platelet 04/06/2021 110 (A) 150 - 450 10*9/L Final   ??? Sodium 04/06/2021 139  135 - 145 mmol/L Final   ??? Potassium 04/06/2021 3.0 (A) 3.4 - 4.8 mmol/L Final   ??? Chloride 04/06/2021 107  98 - 107 mmol/L Final   ??? Anion Gap 04/06/2021 8  5 - 14 mmol/L Final   ??? CO2 04/06/2021 24.0  20.0 - 31.0 mmol/L Final   ??? BUN 04/06/2021 <5 (A) 9 - 23 mg/dL Final   ??? Creatinine 04/06/2021 0.62  0.60 - 0.80 mg/dL Final   ??? EGFR CKD-EPI Non-African American,* 04/06/2021 >90  >=60 mL/min/1.79m2 Final   ??? EGFR CKD-EPI African American, Fem* 04/06/2021 >90  >=60 mL/min/1.48m2 Final   ??? Glucose 04/06/2021 109  70 - 179 mg/dL Final   ??? Calcium 14/78/2956 9.7  8.7 - 10.4 mg/dL Final   ??? Albumin 21/30/8657 3.8  3.4 - 5.0 g/dL Final   ??? Total Protein 04/06/2021 7.6  5.7 - 8.2 g/dL Final   ??? Total Bilirubin 04/06/2021 0.6  0.3 - 1.2 mg/dL Final   ??? AST 84/69/6295 100 (A) <=34 U/L Final   ??? ALT 04/06/2021 64 (A) 10 - 49 U/L Final   ??? Alkaline Phosphatase 04/06/2021 505 (A) 46 - 116 U/L Final   ??? Hepatitis C Ab 04/06/2021 Nonreactive  Nonreactive Final    Antibodies to HCV were not detected.  A nonreactive result does not exclude the possibility of exposure to HCV.   ??? Hep B Core Total Ab 04/06/2021 Nonreactive  Nonreactive Final   ??? Hep B S Ab 04/06/2021 Nonreactive  Nonreactive, Grayzone Final    Nonreactive and Grayzone results are considered non-immune.   ??? Hep B Surf Ab Quant 04/06/2021 <8.00  <8.00 m(IU)/mL Final   ??? Hep B Surface Ag 04/06/2021 Nonreactive  Nonreactive Final   ??? Smooth Muscle Ab 04/06/2021 Negative  Negative Final    Negative: No further testing will be performed     -------------------ADDITIONAL INFORMATION-------------------  This test was developed and its performance characteristics   determined by [facility] in a manner consistent with CLIA   requirements. This test has not been cleared or approved by   the U.S. Food and Drug Administration.     Test Performed by:  [facility]  [address] [place], [place], [place] [address]  Lab Director: Paul Dykes M.D. Ph.D.; CLIA# [id number]   ??? Anti Mitochondrial Ab 04/06/2021 Negative  Negative Final   ??? Anti-Mitochondrial Level 04/06/2021 15.0   Final       Current medications:   Current Outpatient Medications   Medication Sig Dispense Refill   ??? albuterol HFA 90 mcg/actuation inhaler Inhale 2 puffs every six (6) hours as needed for wheezing.     ??? aluminum-magnesium hydroxide-simethicone 200-200-20 mg/5 mL Susp 80 mL, diphenhydrAMINE 12.5 mg/5 mL Liqd 200 mg, nystatin 100,000 unit/mL Susp 8,000,000 Units, distilled water Liqd 80 mL, lidocaine 2% viscous 2 % Soln 80 mL Take 5 mL by mouth every six (6) hours as needed. Swish, gargle, spit or swallow if throat issues 80 mL 2   ??? capecitabine (XELODA) 500 MG tablet Take 3 tablets (1,500 mg total) by mouth Two (2) times a day . (Patient taking differently: Take 1,500 mg by mouth Two (2) times a day . Has not started taking yet (new prescription).) 84 tablet 3   ??? clobetasoL (TEMOVATE) 0.05 % ointment Apply twice a day to rash on palm until smooth/clear. 30 g 1   ??? clonazePAM (KLONOPIN) 0.5 MG tablet Take 0.5 mg daily as needed for anxiety.  Do not exceed 0.5 mg/daily. 30 tablet 0   ??? diclofenac sodium (VOLTAREN) 1 % gel Apply 2 g topically four (4) times a day as needed for arthritis or pain. (Patient not taking: Reported on 04/06/2021) 150 g 2   ??? diphenoxylate-atropine (LOMOTIL) 2.5-0.025 mg per tablet Take 1 tablet by mouth 4 (four) times a day as needed for diarrhea. 30 tablet 1   ??? dorzolamide (TRUSOPT) 2 % ophthalmic solution Administer 1 drop into the left eye Three (3) times a day. 10 mL 12   ??? esomeprazole (NEXIUM) 40 MG capsule Take 1 capsule (40 mg total) by mouth daily. 90 capsule 3   ??? folic acid (FOLVITE) 1 MG tablet Take 1 tablet (1 mg total) by mouth daily. (Patient taking differently: Take 1 mg by mouth daily. Patient unsure if she is taking this.) 100 tablet 3   ??? lasmiditan 50 mg Tab Take 50 mg by mouth once as needed (Once daily in 24hr period PRN migraine) for up to 1 dose. (Patient not taking: Reported on 03/29/2021) 7 tablet 1   ??? lisinopriL-hydrochlorothiazide (PRINZIDE,ZESTORETIC) 20-25 mg per tablet Take 1 tablet by mouth daily. (Patient taking differently: Take 1 tablet by mouth nightly. ) 90 tablet 3   ??? MAGNESIUM ORAL Take by mouth daily. OTC     ??? MORPhine 100 mg/5 mL (20 mg/mL) concentrated solution Take 0.5 mL (10 mg total) by mouth every eight (8) hours as needed for pain. 15 mL 0   ??? naloxone (NARCAN) 4 mg nasal spray One spray in either nostril once for known/suspected opioid overdose. May repeat every 2-3 minutes in alternating nostril til EMS arrives 2 each 0   ??? nicotine (NICODERM CQ) 21 mg/24 hr patch Place 1 patch on the skin daily. (Patient not taking: Reported on 03/29/2021) 28 patch 2   ??? nicotine polacrilex (NICORETTE) 4 MG gum Apply 1 each (4 mg total) to cheek every hour as needed for smoking cessation. (Patient not taking: Reported on 03/29/2021) 110 each 2   ??? ondansetron (ZOFRAN-ODT) 8 MG disintegrating tablet Take 1 tablet (8 mg total) by mouth every twelve (12) hours as needed for nausea. (Patient taking differently: Take 8 mg by mouth every twelve (12) hours as needed for nausea.  Patient has not started taking yet (will take with chemo)) 30 tablet 1   ??? polyethylene glycol (MIRALAX) 17 gram/dose powder Take 17 g by mouth Three (3) times a day as needed (constipation). (Patient not taking: Reported on 04/06/2021) 850 g 3   ??? potassium chloride (KLOR-CON) 10 MEQ CR tablet Take 2 tablets (20 mEq total) by mouth daily. (Patient taking differently: Take 10 mEq by mouth Two (2) times a day. ) 60 tablet 2   ??? pregabalin (LYRICA) 200 MG capsule Take 1 capsule (200 mg total) by mouth Two (2) times a day. 60 capsule 2   ??? prochlorperazine (COMPAZINE) 10 MG tablet Take 1 tablet (10 mg total) by mouth every six (6) hours as needed for nausea. 30 tablet 6   ??? saliva stimulant comb. no.3 (BIOTENE MOISTURIZING MOUTH) Spry Take 1 spray by mouth every two (2) hours as needed (dry mouth). 44.3 mL prn   ??? senna (SENOKOT) 8.6 mg tablet Take 3 tablets by mouth daily.     ??? thiamine HCl (VITAMIN B-1 ORAL) Take by mouth daily.     ??? traZODone (DESYREL) 50 MG tablet Take 1.5 tablets (75 mg total) by mouth nightly. 45 tablet 1   ??? tucatinib (TUKYSA) 150 mg tablet Take 2 tablets (300 mg total) by mouth two (2) times a day . (Patient taking differently: Take 300 mg by mouth two (2) times a day . Has not started taking yet (new prescription)) 120 tablet 2   ??? venlafaxine (EFFEXOR-XR) 150 MG 24 hr capsule Take 150 mg capsule in conjunction with 75 mg capsule for a total of 225 mg by mouth daily 30 capsule 2   ??? venlafaxine (EFFEXOR-XR) 75 MG 24 hr capsule Take 75 mg capsule in conjunction with 150 mg capsule by mouth daily for a total of 225 mg by mouth daily 30 capsule 2     No current facility-administered medications for this visit.

## 2021-04-20 NOTE — Unmapped (Signed)
Returned call to [place], left voicemail to call me back, Dr. [name] would like to know more about this service before signing paperwork. EMR

## 2021-04-22 ENCOUNTER — Ambulatory Visit: Admit: 2021-04-22 | Discharge: 2021-05-05 | Payer: MEDICARE

## 2021-04-22 DIAGNOSIS — R0600 Dyspnea, unspecified: Principal | ICD-10-CM

## 2021-04-22 MED ADMIN — Tc-99m Macroagragated Albumin (MAA): 4.2 | INTRAVENOUS | @ 14:00:00 | Stop: 2021-04-22

## 2021-04-22 NOTE — Unmapped (Signed)
No show. Sent a video link, called the patient twice and left two voicemails.

## 2021-04-26 NOTE — Unmapped (Signed)
Pharmacist Phone Follow-Up    Cancer Team  Medical Oncology: Dr. [name]  Reason for call: Oral chemotherapy management  Current treatment: Trastuzumab + tucatininb + capecitabine     Assessment/Plan  Breast cancer:  Brooke [name] is a 65 yo woman with HER2+ metastatic breast cancer who started trastuzumab, tucatinib, and capecitabine on 4/18.  I spoke to her sister, Brooke [name], today.  I was unable to reach Brooke [name] but I left a VM with my contact information.    Brooke [name] states that her sister has not worsened since last week and is managing the adverse effects that started upon initiation of tucatinib and capecitabine.     Plan:  1. Continue capecitabine and tucatininb schedule and doses  2. Continue lotion to hands and feet to 3 times daily  3. Salt water mouth rinses to 3-4 times daily  4. Continue ondansetron BID prior to capecitabine doses and prochlorperazine for breakthrough nausea  5. Use Imodium if needed for diarrhea  ________________________________________________________________________     Interval History   Brooke [name] is a 65 y.o. female with metastatic breast cancer started IV trastuzumab and oral capecitabine and tucatininb one week ago (04/12/21).  I spoke to her sister, Brooke [name].  She reports that her sister's symptoms have not worsened from last week.  She is using the prochlorperazine in addition to ondansetron for nausea.  She continues to have diarrhea at night and is using Imodium.  Her mucositis is mild and stable.  She is applying lotion to her hands and feet and her symptoms have not worsened.    Oral chemotherapy regimen:??  ????????????????????????Tucatinib 300 mg po BID continuous  ????????????????????????Capecitabine 1500 mg BID x 14 days then 7 days off  ????????????????????????Trastuzumab IV q 3weeks  Start date: 04/12/21  Pharmacy: [facility]     Adherence: Not able to assess    Adverse Effects:  1. Nausea - Taking ondansetron BID prior to taking capecitabine.  She is using prochlorperazine for breakthrough N/V.  2. Mucositis - Using salt water mouth rinses 3-4 times daily; Symptoms are mild and have not worened  3. Hand/foot syndrome - Patient having soreness in her hands and feet.  Is not having trouble doing tasks or walking. Symptoms are stable  4. Diarrhea - Patient having occasional loose stool, particularly at night.  Using Imodium as needed.  Drug interactions: Not assessed    Breast Oncology    Breast Oncology Metrics:         Chemotherapy Dose: Dose documented     Chemotherapy Schedule: Schedule documented     NCI CTCAE: Nausea/Vomiting - Grade 1, Diarrhea - Grade 1, Mucositis - Grade 1     Interventions: Education provided         Patient's sister verbalized understanding of the above information.     I spent 15 minutes on the phone with the patient on the date of service. I spent an additional 10 minutes on pre- and post-visit activities.     The patient was physically located in [place] or a state in which I am permitted to provide care. The patient and/or parent/guardian understood that s/he may incur co-pays and cost sharing, and agreed to the telemedicine visit. The visit was reasonable and appropriate under the circumstances given the patient's presentation at the time.    The patient and/or parent/guardian has been advised of the potential risks and limitations of this mode of treatment (including, but not limited to, the absence of in-person examination) and has agreed  to be treated using telemedicine. The patient's/patient's family's questions regarding telemedicine have been answered.     If the visit was completed in an ambulatory setting, the patient and/or parent/guardian has also been advised to contact their provider???s office for worsening conditions, and seek emergency medical treatment and/or call 911 if the patient deems either necessary.        Oncology History Overview Note   Identifying Statement:  Brooke [name] is a 65 y.o. female diagnosed with a right posterior frontal lobe metastasis (breast primary) status post resection 01/15/2019 and 5/5 fractions of SBRT (2500 Gy via CyberKnife) to the resection cavity.    Treatment History:  09/05/17: S/p SRS to 7 lesions  01/15/19: S/p resection, followed by SRS 2500 cGy   03/04/19: KPS 80; MRI with postsurgical changes versus residual disease; RTC in 2 weeks w/ MRI   04/15/19: KPS NA; MRI w/ SD; no study; RTC 6 weeks w/ MRI   06/10/19: KPS 80; MRI w/ SD; RTC 4 weeks  07/15/19: KPS 80; MRI w/ SD; RTC 8 weeks  08/19/19: KPS 80; start nortriptyline for HAs; RTC 4 weeks w/ MRI  09/16/19: KPS 80; con't nortriptyline for HAs; RTC 2 mos w/ MRI  11/11/19: KPS 80; MRI w/ SD though w/ small infarct; proceed w/ CVA workup; start 81mg  ASA; encouraged smoking cessation; con't nortriptyline; RTC to discuss results  11/25/19: KPS 80; discussed CVA workup results; MRA unremarkable; con't smoking cessation efforts; con't nortriptyline for HAs; con't 81mg  ASA, start Lipitor; f/u with PCP for COPD eval; RTC 2 mos w/ MRI     Malignant neoplasm of overlapping sites of right female breast (CMS-HCC)   2017 -  Presenting Symptoms    Large open wound RT breast which began as nipple inversion. Patient states that she ignored for a long time.  Physical exam of the area of concern in the RTbreast demonstrates a large open wound in the lateral right breast. Saw PCP 01/27/16 Rx Keflex.     02/04/2016 Interval Scan(s)    MMG/US: 3.1 (3.0) cm irregular spiculated dense mass in lateral Rt breast w nipple and skin retraction. 2 subcentimeter irregular masses in the RUOQ  (10', 11') (possible satellite lesions). Large Rt axillary lymph node 1.7. Lt breast clear. BIRAD 5.     02/08/2016 Biopsy    US guided Rt. Axillary LN biopsy:  Gr 3, IDC with apocrine features. ER (-), PR (31-40), HER-2 (3+). Noted to have multiple skin lesions. Poor access to breast mass due open 10-15 cm wound risk of pain and bleeding.     02/09/2016 Initial Diagnosis    Malignant neoplasm of overlapping sites of right female breast (RAF-HCC)     02/10/2016 Interval Scan(s)    CT CAP: Large, necrotic right breast mass with wide open tract to the skin, consistent with known right breast malignancy.  Numerous enlarged right axillary, subpectoral, mediastinal, bilateral hilar lymph nodes and innumerable bilateral pulmonary nodules     02/10/2016 Interval Scan(s)    NM Bone scan: No osseous metastatic disease.     04/28/2016 - 03/10/2020 Chemotherapy    OP TRASTUZUMAB (EVERY 21 DAYS)  Trastuzumab 8 mg/kg loading then 6 mg/kg every 21 days     07/13/2016 Genetics    STRATA  ??? ERBB2 copy number alteration  Estimated copy number: 40, confidence interval: 35.7 - 44.0, cellularity: 50%  Associated FDA-approved targeted therapies in breast cancer: trastuzumab, lapatinib, ado-trastuzumab emtansine, pertuzumab  ??? PIK3CA [id number]     01/03/2020 -  Cancer Staged    CT CAP 01/03/2020 which showed a new lingular opacity measuring 1.9 cm in the right lung. There was also slight increase in the right breast mass from 1.2-> 1.5 cm.  Bone scan shows no osseous metastases. Overall I considered these findings indeterminate and ppted to repeat CT CAP in 1 month     02/03/2020 -  Cancer Staged    CT chest 02/03/20 which showed that the lingular lesion has persisted and increased in size.  No mediastinal adenopathy reported.  She continues to report a nonproductive cough and mild dyspnea.  She is a chronic smoker and currently smokes on average half a pack per day.  My major concern is that this could be a second primary lung cancer as her other sites of diseases stable on current systemic therapy       02/03/2020 Endocrine/Hormone Therapy    Stop Tamoxifen, Add Fasoldex, continue Q3week Hercpetin     03/23/2020 - 02/09/2021 Chemotherapy    OP BREAST ADO-TRASTUZUMAB EMTANSINE  ado-trastuzumab emtansine 3.6 mg/kg IV on day 1, every 21 days     04/12/2021 -  Chemotherapy    OP TRASTUZUMAB (EVERY 21 DAYS)  trastuzumab 8 mg/kg IV LOADING, then 6 mg/kg IV MAINT, every 21 days Brain metastases (CMS-HCC)   08/07/2017 Initial Diagnosis    Brain metastases (CMS-HCC)    Summary of Radiosurgery   Rx:7 brain met: 09/05/2017: 2,000/2,000 cGy  Sec:R Frontal: 09/05/2017: 2,000 cGy  Sec:L Post Fr: 09/05/2017: 2,000 cGy  Sec:L Ant Fro: 09/05/2017: 2,000 cGy  Sec:R Sup Oc: 09/05/2017: 2,000 cGy  Sec:L Occipita: 09/05/2017: 2,000 cGy  Sec:R Inf Occi: 09/05/2017: 2,000 cGy  Sec:L Tempor: 09/05/2017: 2,000 cGy  Rx:Lt Med Oc: 09/13/2018: 2,000/2,000 cGy  Rx:Rt Frontal: : 2,500/2,500 cGy    01/15/2019: Craniotomy and resection of right posterior frontal lobe metastasis. Followed by [facility]    03/04/19: KPS 80; MRI with postsurgical changes versus residual disease; RTC in 2 weeks w/ MRI     01/03/2020 -  Cancer Staged    CT CAP 01/03/2020 which showed a new lingular opacity measuring 1.9 cm in the right lung. There was also slight increase in the right breast mass from 1.2-> 1.5 cm.  Bone scan shows no osseous metastases. Overall I considered these findings indeterminate and ppted to repeat CT CAP in 1 month     02/03/2020 -  Cancer Staged    CT chest 02/03/20 which showed that the lingular lesion has persisted and increased in size.  No mediastinal adenopathy reported.  She continues to report a nonproductive cough and mild dyspnea.  She is a chronic smoker and currently smokes on average half a pack per day.  My major concern is that this could be a second primary lung cancer as her other sites of diseases stable on current systemic therapy       02/03/2020 Endocrine/Hormone Therapy    Stop Tamoxifen, Add Fasoldex, continue Q3week Hercpetin     Metastatic breast cancer (CMS-HCC)   01/25/2019 Initial Diagnosis    Metastatic breast cancer (CMS-HCC)     03/23/2020 - 02/09/2021 Chemotherapy    OP BREAST ADO-TRASTUZUMAB EMTANSINE  ado-trastuzumab emtansine 3.6 mg/kg IV on day 1, every 21 days     04/12/2021 -  Chemotherapy    OP TRASTUZUMAB (EVERY 21 DAYS)  trastuzumab 8 mg/kg IV LOADING, then 6 mg/kg IV MAINT, every 21 days Pertinent Labs:   No visits with results within 1 Day(s) from this visit.   Latest  known visit with results is:   Office Visit on 04/06/2021   Component Date Value Ref Range Status   ??? PT 04/06/2021 10.6  10.3 - 13.4 sec Final   ??? INR 04/06/2021 0.91   Final   ??? WBC 04/06/2021 5.4  3.6 - 11.2 10*9/L Final   ??? RBC 04/06/2021 4.39  3.95 - 5.13 10*12/L Final   ??? HGB 04/06/2021 14.4  11.3 - 14.9 g/dL Final   ??? HCT 16/09/9603 43.1  34.0 - 44.0 % Final   ??? MCV 04/06/2021 98.3 (A) 77.6 - 95.7 fL Final   ??? MCH 04/06/2021 32.7 (A) 25.9 - 32.4 pg Final   ??? MCHC 04/06/2021 33.3  32.0 - 36.0 g/dL Final   ??? RDW 54/08/8118 13.4  12.2 - 15.2 % Final   ??? MPV 04/06/2021 8.2  6.8 - 10.7 fL Final   ??? Platelet 04/06/2021 110 (A) 150 - 450 10*9/L Final   ??? Sodium 04/06/2021 139  135 - 145 mmol/L Final   ??? Potassium 04/06/2021 3.0 (A) 3.4 - 4.8 mmol/L Final   ??? Chloride 04/06/2021 107  98 - 107 mmol/L Final   ??? Anion Gap 04/06/2021 8  5 - 14 mmol/L Final   ??? CO2 04/06/2021 24.0  20.0 - 31.0 mmol/L Final   ??? BUN 04/06/2021 <5 (A) 9 - 23 mg/dL Final   ??? Creatinine 04/06/2021 0.62  0.60 - 0.80 mg/dL Final   ??? EGFR CKD-EPI Non-African American,* 04/06/2021 >90  >=60 mL/min/1.10m2 Final   ??? EGFR CKD-EPI African American, Fem* 04/06/2021 >90  >=60 mL/min/1.79m2 Final   ??? Glucose 04/06/2021 109  70 - 179 mg/dL Final   ??? Calcium 14/78/2956 9.7  8.7 - 10.4 mg/dL Final   ??? Albumin 21/30/8657 3.8  3.4 - 5.0 g/dL Final   ??? Total Protein 04/06/2021 7.6  5.7 - 8.2 g/dL Final   ??? Total Bilirubin 04/06/2021 0.6  0.3 - 1.2 mg/dL Final   ??? AST 84/69/6295 100 (A) <=34 U/L Final   ??? ALT 04/06/2021 64 (A) 10 - 49 U/L Final   ??? Alkaline Phosphatase 04/06/2021 505 (A) 46 - 116 U/L Final   ??? Hepatitis C Ab 04/06/2021 Nonreactive  Nonreactive Final    Antibodies to HCV were not detected.  A nonreactive result does not exclude the possibility of exposure to HCV.   ??? Hep B Core Total Ab 04/06/2021 Nonreactive  Nonreactive Final   ??? Hep B S Ab 04/06/2021 Nonreactive  Nonreactive, Grayzone Final    Nonreactive and Grayzone results are considered non-immune.   ??? Hep B Surf Ab Quant 04/06/2021 <8.00  <8.00 m(IU)/mL Final   ??? Hep B Surface Ag 04/06/2021 Nonreactive  Nonreactive Final   ??? Smooth Muscle Ab 04/06/2021 Negative  Negative Final    Negative: No further testing will be performed     -------------------ADDITIONAL INFORMATION-------------------  This test was developed and its performance characteristics   determined by [facility] in a manner consistent with CLIA   requirements. This test has not been cleared or approved by   the U.S. Food and Drug Administration.     Test Performed by:  [facility]  [address] [place], [place], [place] [address]  Lab Director: Paul Dykes M.D. Ph.D.; CLIA# [id number]   ??? Anti Mitochondrial Ab 04/06/2021 Negative  Negative Final   ??? Anti-Mitochondrial Level 04/06/2021 15.0   Final       Current medications:     Current Outpatient  Medications   Medication Sig Dispense Refill   ??? albuterol HFA 90 mcg/actuation inhaler Inhale 2 puffs every six (6) hours as needed for wheezing.     ??? aluminum-magnesium hydroxide-simethicone 200-200-20 mg/5 mL Susp 80 mL, diphenhydrAMINE 12.5 mg/5 mL Liqd 200 mg, nystatin 100,000 unit/mL Susp 8,000,000 Units, distilled water Liqd 80 mL, lidocaine 2% viscous 2 % Soln 80 mL Take 5 mL by mouth every six (6) hours as needed. Swish, gargle, spit or swallow if throat issues 80 mL 2   ??? capecitabine (XELODA) 500 MG tablet Take 3 tablets (1,500 mg total) by mouth Two (2) times a day . (Patient taking differently: Take 1,500 mg by mouth Two (2) times a day . Has not started taking yet (new prescription).) 84 tablet 3   ??? clobetasoL (TEMOVATE) 0.05 % ointment Apply twice a day to rash on palm until smooth/clear. 30 g 1   ??? clonazePAM (KLONOPIN) 0.5 MG tablet Take 0.5 mg daily as needed for anxiety.  Do not exceed 0.5 mg/daily. 30 tablet 0   ??? diclofenac sodium (VOLTAREN) 1 % gel Apply 2 g topically four (4) times a day as needed for arthritis or pain. (Patient not taking: Reported on 04/06/2021) 150 g 2   ??? diphenoxylate-atropine (LOMOTIL) 2.5-0.025 mg per tablet Take 1 tablet by mouth 4 (four) times a day as needed for diarrhea. 30 tablet 1   ??? dorzolamide (TRUSOPT) 2 % ophthalmic solution Administer 1 drop into the left eye Three (3) times a day. 10 mL 12   ??? esomeprazole (NEXIUM) 40 MG capsule Take 1 capsule (40 mg total) by mouth daily. 90 capsule 3   ??? folic acid (FOLVITE) 1 MG tablet Take 1 tablet (1 mg total) by mouth daily. (Patient taking differently: Take 1 mg by mouth daily. Patient unsure if she is taking this.) 100 tablet 3   ??? lasmiditan 50 mg Tab Take 50 mg by mouth once as needed (Once daily in 24hr period PRN migraine) for up to 1 dose. (Patient not taking: Reported on 03/29/2021) 7 tablet 1   ??? lisinopriL-hydrochlorothiazide (PRINZIDE,ZESTORETIC) 20-25 mg per tablet Take 1 tablet by mouth daily. (Patient taking differently: Take 1 tablet by mouth nightly. ) 90 tablet 3   ??? MAGNESIUM ORAL Take by mouth daily. OTC     ??? MORPhine 100 mg/5 mL (20 mg/mL) concentrated solution Take 0.5 mL (10 mg total) by mouth every eight (8) hours as needed for pain. 15 mL 0   ??? naloxone (NARCAN) 4 mg nasal spray One spray in either nostril once for known/suspected opioid overdose. May repeat every 2-3 minutes in alternating nostril til EMS arrives 2 each 0   ??? nicotine (NICODERM CQ) 21 mg/24 hr patch Place 1 patch on the skin daily. (Patient not taking: Reported on 03/29/2021) 28 patch 2   ??? nicotine polacrilex (NICORETTE) 4 MG gum Apply 1 each (4 mg total) to cheek every hour as needed for smoking cessation. (Patient not taking: Reported on 03/29/2021) 110 each 2   ??? ondansetron (ZOFRAN-ODT) 8 MG disintegrating tablet Take 1 tablet (8 mg total) by mouth every twelve (12) hours as needed for nausea. (Patient taking differently: Take 8 mg by mouth every twelve (12) hours as needed for nausea. Patient has not started taking yet (will take with chemo)) 30 tablet 1   ??? polyethylene glycol (MIRALAX) 17 gram/dose powder Take 17 g by mouth Three (3) times a day as needed (constipation). (Patient not taking: Reported on 04/06/2021) 850  g 3   ??? potassium chloride (KLOR-CON) 10 MEQ CR tablet Take 2 tablets (20 mEq total) by mouth daily. (Patient taking differently: Take 10 mEq by mouth Two (2) times a day. ) 60 tablet 2   ??? pregabalin (LYRICA) 200 MG capsule Take 1 capsule (200 mg total) by mouth Two (2) times a day. 60 capsule 2   ??? prochlorperazine (COMPAZINE) 10 MG tablet Take 1 tablet (10 mg total) by mouth every six (6) hours as needed for nausea. 30 tablet 6   ??? saliva stimulant comb. no.3 (BIOTENE MOISTURIZING MOUTH) Spry Take 1 spray by mouth every two (2) hours as needed (dry mouth). 44.3 mL prn   ??? senna (SENOKOT) 8.6 mg tablet Take 3 tablets by mouth daily.     ??? thiamine HCl (VITAMIN B-1 ORAL) Take by mouth daily.     ??? traZODone (DESYREL) 50 MG tablet Take 1.5 tablets (75 mg total) by mouth nightly. 45 tablet 1   ??? tucatinib (TUKYSA) 150 mg tablet Take 2 tablets (300 mg total) by mouth two (2) times a day . (Patient taking differently: Take 300 mg by mouth two (2) times a day . Has not started taking yet (new prescription)) 120 tablet 2   ??? venlafaxine (EFFEXOR-XR) 150 MG 24 hr capsule Take 150 mg capsule in conjunction with 75 mg capsule for a total of 225 mg by mouth daily 30 capsule 2   ??? venlafaxine (EFFEXOR-XR) 75 MG 24 hr capsule Take 75 mg capsule in conjunction with 150 mg capsule by mouth daily for a total of 225 mg by mouth daily 30 capsule 2     No current facility-administered medications for this visit.

## 2021-04-26 NOTE — Unmapped (Signed)
[facility] Clinical Assessment & Refill Coordination Note    Brooke [name], DOB: 12/09/1956  Phone: [phone] (home)     All above HIPAA information was verified with patient's family member, sister, [name].     Was a Nurse, learning disability used for this call? No    Specialty Medication(s):   Hematology/Oncology: Tukysa 150mg  and Capecitabine 500mg , directions: 3 tabs two times daily     Current Outpatient Medications   Medication Sig Dispense Refill   ??? albuterol HFA 90 mcg/actuation inhaler Inhale 2 puffs every six (6) hours as needed for wheezing.     ??? aluminum-magnesium hydroxide-simethicone 200-200-20 mg/5 mL Susp 80 mL, diphenhydrAMINE 12.5 mg/5 mL Liqd 200 mg, nystatin 100,000 unit/mL Susp 8,000,000 Units, distilled water Liqd 80 mL, lidocaine 2% viscous 2 % Soln 80 mL Take 5 mL by mouth every six (6) hours as needed. Swish, gargle, spit or swallow if throat issues 80 mL 2   ??? capecitabine (XELODA) 500 MG tablet Take 3 tablets (1,500 mg total) by mouth Two (2) times a day . (Patient taking differently: Take 1,500 mg by mouth Two (2) times a day . Has not started taking yet (new prescription).) 84 tablet 3   ??? clobetasoL (TEMOVATE) 0.05 % ointment Apply twice a day to rash on palm until smooth/clear. 30 g 1   ??? clonazePAM (KLONOPIN) 0.5 MG tablet Take 0.5 mg daily as needed for anxiety.  Do not exceed 0.5 mg/daily. 30 tablet 0   ??? diclofenac sodium (VOLTAREN) 1 % gel Apply 2 g topically four (4) times a day as needed for arthritis or pain. (Patient not taking: Reported on 04/06/2021) 150 g 2   ??? diphenoxylate-atropine (LOMOTIL) 2.5-0.025 mg per tablet Take 1 tablet by mouth 4 (four) times a day as needed for diarrhea. 30 tablet 1   ??? dorzolamide (TRUSOPT) 2 % ophthalmic solution Administer 1 drop into the left eye Three (3) times a day. 10 mL 12   ??? esomeprazole (NEXIUM) 40 MG capsule Take 1 capsule (40 mg total) by mouth daily. 90 capsule 3   ??? folic acid (FOLVITE) 1 MG tablet Take 1 tablet (1 mg total) by mouth daily. (Patient taking differently: Take 1 mg by mouth daily. Patient unsure if she is taking this.) 100 tablet 3   ??? lasmiditan 50 mg Tab Take 50 mg by mouth once as needed (Once daily in 24hr period PRN migraine) for up to 1 dose. (Patient not taking: Reported on 03/29/2021) 7 tablet 1   ??? lisinopriL-hydrochlorothiazide (PRINZIDE,ZESTORETIC) 20-25 mg per tablet Take 1 tablet by mouth daily. (Patient taking differently: Take 1 tablet by mouth nightly. ) 90 tablet 3   ??? MAGNESIUM ORAL Take by mouth daily. OTC     ??? MORPhine 100 mg/5 mL (20 mg/mL) concentrated solution Take 0.5 mL (10 mg total) by mouth every eight (8) hours as needed for pain. 15 mL 0   ??? naloxone (NARCAN) 4 mg nasal spray One spray in either nostril once for known/suspected opioid overdose. May repeat every 2-3 minutes in alternating nostril til EMS arrives 2 each 0   ??? nicotine (NICODERM CQ) 21 mg/24 hr patch Place 1 patch on the skin daily. (Patient not taking: Reported on 03/29/2021) 28 patch 2   ??? nicotine polacrilex (NICORETTE) 4 MG gum Apply 1 each (4 mg total) to cheek every hour as needed for smoking cessation. (Patient not taking: Reported on 03/29/2021) 110 each 2   ??? ondansetron (ZOFRAN-ODT) 8  MG disintegrating tablet Take 1 tablet (8 mg total) by mouth every twelve (12) hours as needed for nausea. (Patient taking differently: Take 8 mg by mouth every twelve (12) hours as needed for nausea. Patient has not started taking yet (will take with chemo)) 30 tablet 1   ??? polyethylene glycol (MIRALAX) 17 gram/dose powder Take 17 g by mouth Three (3) times a day as needed (constipation). (Patient not taking: Reported on 04/06/2021) 850 g 3   ??? potassium chloride (KLOR-CON) 10 MEQ CR tablet Take 2 tablets (20 mEq total) by mouth daily. (Patient taking differently: Take 10 mEq by mouth Two (2) times a day. ) 60 tablet 2   ??? pregabalin (LYRICA) 200 MG capsule Take 1 capsule (200 mg total) by mouth Two (2) times a day. 60 capsule 2   ??? prochlorperazine (COMPAZINE) 10 MG tablet Take 1 tablet (10 mg total) by mouth every six (6) hours as needed for nausea. 30 tablet 6   ??? saliva stimulant comb. no.3 (BIOTENE MOISTURIZING MOUTH) Spry Take 1 spray by mouth every two (2) hours as needed (dry mouth). 44.3 mL prn   ??? senna (SENOKOT) 8.6 mg tablet Take 3 tablets by mouth daily.     ??? thiamine HCl (VITAMIN B-1 ORAL) Take by mouth daily.     ??? traZODone (DESYREL) 50 MG tablet Take 1.5 tablets (75 mg total) by mouth nightly. 45 tablet 1   ??? tucatinib (TUKYSA) 150 mg tablet Take 2 tablets (300 mg total) by mouth two (2) times a day . (Patient taking differently: Take 300 mg by mouth two (2) times a day . Has not started taking yet (new prescription)) 120 tablet 2   ??? venlafaxine (EFFEXOR-XR) 150 MG 24 hr capsule Take 150 mg capsule in conjunction with 75 mg capsule for a total of 225 mg by mouth daily 30 capsule 2   ??? venlafaxine (EFFEXOR-XR) 75 MG 24 hr capsule Take 75 mg capsule in conjunction with 150 mg capsule by mouth daily for a total of 225 mg by mouth daily 30 capsule 2     No current facility-administered medications for this visit.        Changes to medications: Asti reports no changes at this time.    Allergies   Allergen Reactions   ??? Adhesive Rash   ??? Decadron [Dexamethasone] Anxiety     Mania as well   ??? Morphine Itching   ??? Tetracycline      Other reaction(s): Other (See Comments)   ??? Oxycodone      Night terrors   ??? Tegaderm Adhesive-No Drug-Allergy Check Rash       Changes to allergies: No    SPECIALTY MEDICATION ADHERENCE     Capecitabine 500 mg: 7 days of medicine on hand   Tukysa 150 mg: 14 days of medicine on hand       Medication Adherence    Patient reported X missed doses in the last month: 0  Specialty Medication: Capecitabine 500 mg  Patient is on additional specialty medications: Yes  Additional Specialty Medications: Tukysa 150mg   Patient Reported Additional Medication X Missed Doses in the Last Month: 0  Informant: patient  Confirmed plan for next specialty medication refill: delivery by pharmacy  Refills needed for supportive medications: not needed          Specialty medication(s) dose(s) confirmed: Regimen is correct and unchanged.     Are there any concerns with adherence? No    Adherence counseling provided? Not  needed    CLINICAL MANAGEMENT AND INTERVENTION      Clinical Benefit Assessment:    Do you feel the medicine is effective or helping your condition? Yes    Clinical Benefit counseling provided? Not needed    Adverse Effects Assessment:    Are you experiencing any side effects? Yes, patient reports experiencing nausea and diarrhea. Side effect counseling provided: managed by Ondansetron and Lomotil    Are you experiencing difficulty administering your medicine? No    Quality of Life Assessment:    How many days over the past month did your condition/medication  keep you from your normal activities? For example, brushing your teeth or getting up in the morning. 0    Have you discussed this with your provider? Not needed    Acute Infection Status:    Acute infections noted within Epic:  No active infections  Patient reported infection: None    Therapy Appropriateness:    Is therapy appropriate? Yes, therapy is appropriate and should be continued    DISEASE/MEDICATION-SPECIFIC INFORMATION      N/A    PATIENT SPECIFIC NEEDS     - Does the patient have any physical, cognitive, or cultural barriers? No    - Is the patient high risk? Yes, patient is taking oral chemotherapy. Appropriateness of therapy as been assessed    - Does the patient require a Care Management Plan? No     - Does the patient require physician intervention or other additional services (i.e. nutrition, smoking cessation, social work)? No      SHIPPING     Specialty Medication(s) to be Shipped:   Hematology/Oncology: Capecitabine 500mg , directions: 3 tabs twice daily for 14 days on and 7 days off    Other medication(s) to be shipped: No additional medications requested for fill at this time     Changes to insurance: No    Delivery Scheduled: Yes, Expected medication delivery date: Capecitabine on 04/28/21 and Tukysa on 05/11.     Medication will be delivered via Next Day Courier to the confirmed prescription address in [facility].    The patient will receive a drug information handout for each medication shipped and additional FDA Medication Guides as required.  Verified that patient has previously received a Conservation officer, historic buildings and a Surveyor, mining.    All of the patient's questions and concerns have been addressed.    Jona Zappone Vangie Bicker   [facility] Specialty Pharmacist

## 2021-04-27 MED FILL — CAPECITABINE 500 MG TABLET: ORAL | 21 days supply | Qty: 84 | Fill #1

## 2021-04-28 NOTE — Unmapped (Signed)
BREAST MEDICAL ONCOLOGY  -----------------------------------------------------------------------------------------------------  Follow-up Outpatient Evaluation    PCP: [name], MD     Consulting Physicians: Surgical oncology: Tyson Alias M.D.     ID: Metastatic breast cancer, ER-, PR+, HER-2 positive to lung, brain.  First-line and second line: Trastuzumab + AI -->Tamoxifen d/t arthralgias.  3rd line:  TDM1.  4th line: capecitabine (xeloda), tucatinib, herceptin    Reason for Visit: Here for management of breast cancer.  ------------------------------------------------------------------------------------------------------  I personally spent 40 minutes face-to-face and non-face-to-face in the care of this patient, which includes all pre, intra, and post visit time on the date of service.  ------------------------------------------------------------------------------------------------------  Assessment:  Brooke [name] is a 65 y.o. 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    Brooke [name] presents today for follow up on Xeloda, tucatinib + Herceptin, which she started on 4/18.  She is overall doing well. 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 She did not get LFTs checked today and will rpt next visit. Labs WEL to proceed with therapy. She will starts C2 of Xeloda today, proceed with herceptin, and continue tucatinib. She has plans to have a tooth pulled, which we will arrange in her week off of Xeloda. She will follow up in 9 weeks. Plan restaging imaging after 3 months on current regimen. (~7/18) She will reach out in the interim if she has any questions or concerns.      1. Metastatic breast cancer HR + HER-2 overexpressing     A. Systemic therapy   First-line: THP. Taxol d/c for G3 neuropathy. Pertuzumab d/c for G3 diarrhea.  Not progression.  Second line: Trastuzumab + AI -->Tamoxifen 2/2 arthralgias.  Third line: Rebiopsy ER -/PR 10%/HER-2 +ve. C1 TDM1 03/23/20 limited by neuropathy. 09/01/20: DR TDM1 to 2.4 mg/kg. Discontinue TDM1 d/t abnormal LFT's and not progression.   Fourth line:  Herceptin + tucatinib + capecitabine (Xeloda)   Subsequent lines:  Consider return to TDM1 or Enhertu on progression predicated on LFTs.    B.  Systemic imaging  -- 12/08/20: BS: No bone metastases. 01/28/21 CT AP: SD in the chest. Hepatic steatosis w/ ill defined area in the left hepatic lobe. -- [place] 02/23/21 which was unremarkable for nodular regenerative hyperplasia.  MRI abdomen pending 03/22/21. The area in question on the prior CT and ultrasound likely represents a focal area of hepatic steatosis. Otherwise, no suspicious hepatic lesion is identified. Known left lower lobe pulmonary mass measures at least 4 cm. Other pulmonary nodules are not well delineated by MRI. Multiple tiny cystic lesions are present in the pancreas. 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 The main pancreatic duct is non-dilated. No evidence of biliary obstruction.     C.  Brain mets/neuroimaging  Surgery: 01/15/19: resection of the right frontal met.  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   03/10/21: MRI brain: SD.    D.  Molecular  Genomics:  STRATA: ERBB2, PIK3CA.  Not eligible for HARMONY.  Genetic: Referral submitted in 03/2020, patient was never reached. Will f/u STAT DNA lab testing and Econsultation in next visit.    E. Radiation:   09/05/17: CK to 7 brain lesions. 09/13/18: CK (1#) right 9 mm lesion. 2/28 - 02/28/2019: CK 25 Gy in  5# to left frontal resection bed.    F. 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 Barbette Merino who thought not likely consequential but recommended CV cards eval.    > F/u with Dr. Harland German.   > Echo 12/30/20: EF 60-65%.     2.  Comorbidities/supportive care  > Dysphagia/GERD/hemoglobin drop: Continue Nexium/famotidine.  TTG positive.  Given stable Hgb will hold on endoscopy for now.  F/u with Dr. Randye Lobo.  Gluten-free diet.   > Depression/Anxiety: Venlafaxine 225 mg. C/w clonazepam. F/u psychiatry/[facility] (Dr. Vertell Limber).    > Peripheral neuropathy: Venlafaxine 225 mg. C/w Lyrica dose 200 mg BID.  Paraneoplastic panels unremarkable. Dose reduced TDM1.  Did not tolerate methadone due to sedation/dizziness. Follows with palliative care. Can refer back to neurology to consider TENS versus scrambler therapy.   > Smoking cessation: Previously encouraged to stop smoking. 1 ppd currently. Previously prescribed Chantix.   > Insomnia: Continue trazodone 75mg .  No longer taking Remeron.   > Arthralgias: C-spine x-ray showed DJD.  Muscle relaxant by PCP. Referred to PT for back previously. Currently receiving neck PT.  If no improvement refer back to Dr. Glenice Laine. Consider PCI. Nondisplaced right hip fracture: Has opiates for mgmt. Discussed possible scrambler therapy. Recommended Tramadol.   > Wheezing:  Previous recommendations made to her to reschedule pulm appointment; recommended by PCP for COPD eval, and to again consider reducing smoking.  Following with pulmonary, Dr. Wellington Hampshire, MD. NM lung scan 03/2021 WNL. Thought to be cardiology related.   > Cirrhosis. Abnormal LFTs. Follows with GI, Dr. Raford Pitcher. No more alcohol. Will recheck LFTs today.   > Iron deficiency/fatigue: Calculated iron deficit 1.5 g.  IV iron 12/11/20 and 01/19/21. Thalassemia testing 12/29/2020 does not show hereditary link.   >Scapulothoracic bursitis: Consider steroid injections. Referred to Dr. Glenice Laine.   >Health Maintenance: COVID-19 vaccinated. Had booster.     3.  Follow-up   -- Add CMP next visit  -- Continue Capecitabine + Tucatinib   -- Continue IV Trastuzumab   -- RTC in 9 weeks   -----------------------------------------------------------------------------------------------------  Interval history:  Ms. Langan is a 65 y.o. F who presents today accompanied by her sister, [name], for follow up on Xeloda, Tucatinib, Herceptin.   -- Doing well overall.   -- Moderate fatigue.   -- Abdominal pain and distention in RUQ.  -- Right breast ulcer, improving.   -- Plantar wart to right foot.   -- Bilateral scapular lower back pain. Improved today.   -- Recent fall. Two weeks ago. Trauma to right leg. Able to ambulate.   -- Full 12 ROS reviewed and otherwise mild/none.     Social history update: Sister Lisa's granddaughter recently passed away and her grandson was diagnosed with brain cancer. Has a boyfriend, [name], She has a son, [name].     Review of Systems: A complete twelve systems review was obtained and is positive per the HPI but otherwise negative or unchanged. See MIMS #1170 where available.    ONC HISTORY  Metastatic HR + HER-2 overexpressing breast cancer to lung and brain.  First-line and second line: Trastuzumab + AI -->Tamoxifen d/t arthralgias.  3rd line:  TDM1.  4th line: capecitabine (xeloda), tucatinib, herceptin    Consider [id number], Enhertu on progression.     Radiation: 09/05/17: CK to 7 brain lesions. 09/13/18: CK (1#) right 9 mm lesion. 2/28 - 02/28/2019: CK 25 Gy in 5#  to left frontal resection bed.  Surgery: 01/15/19: resection of the right frontal met.  Genomic: STRATA: ERBB2, PIK3CA.  Not eligible for HARMONY.    2017 or earlier:  R breast wound and nipple inversion which patient describes herself as ignoring for some time    02/04/16:  Mammogram R breast showing 3.1 cm irregular spiculated dense mass in lateral Rt breast w nipple and skin retraction. 2 subcentimeter irregular masses in the RUOQ  (10', 11') (possible satellite lesions). Large Rt axillary lymph node 1.7. Lt breast clear. BIRAD 5.    02/08/16:  Core biopsy R axillary LN (breast bx deferred due to open wound) Gr 3, IDC with apocrine features. ER (-), PR (31-40), HER-2 (3+). Noted to have multiple skin lesions.    02/10/16:  Initial staging with CT CAP: Large, necrotic right breast mass with wide open tract to the skin, consistent with known right breast malignancy.  Numerous enlarged right axillary, subpectoral, mediastinal, bilateral hilar lymph nodes and innumerable bilateral pulmonary nodules.  Bone scan negative for osseous mets.    --Started trastuzumab initially with THP.  Taxol d/c after three cycles for G3 neuropathy.  Pertuzumab d/c for G3 diarrhea.  Not discontinued due to progression.    04/28/16:  Switched to anastrozole/trastuzumab  --patient had trouble tolerating AIs, switched to exemestane    07/13/16: genomic testing with STRATA showing ERBB2 copy # alteration, PIK3CA p.[id number]    08/01/17:  MRI showed multiple small brain metastases   --completed CK to 7 lesions on 09/05/17.   --CK to the right 9 mm lesion (1 fx on 09/13/18).    --continued on AI/trastuzumab    11/2018:  progression of her frontal lobe lesion.   --resection of the right frontal mass by Dr. Tresa Garter on 01/15/2019.  Pathology was consistent with metastatic breast cancer.  --2/28 - 02/28/2019: CK 25 Gy in 5# to left frontal resection bed     04/2019: switched to tamoxifen/trastuzmab due to intolerance of AIs (joint pain    09/2019:  Concern for possible slight extra-cranial progression in LLL lung mass, elected to continue on current tx    01/03/20:  Repeat CT with new lingular 1.9 cm nodular opacity; differential diagnosis includes new metastatic lesion and consolidative/infectious opacity. CT chest 1 month is recommended to help discriminate between these two etiologies.  Also slight increase R breast mass.    02/03/20:  Repeat CT again with lingular 1.9 cm nodule unchanged to slightly increased in size, left lower lobe 3.5 cm mass slightly increased in size. Discussed at [facility] due to concern that lingular mass might represent lung primary in current smoker. Recommended biopsy.      01/2020:  Changed to fulvestrant/trastuzumab due to progression    03/05/20:  FNA lung biopsy showing carcinoma c/w breast primary (GATA3+, TTF1-), ER neg/PR 10%/HER2 3+.  Switched to [employer]. C1D1 03/23/20.    03/29/21: Switched to capecitabine (xeloda), tucatinib, herceptin d/t abnormal LFT's.      Onc Family History:   - Father: prostate  - Mat grandfather: prostate  - Mat uncle: lung  - Pat aunt: breast   - Great niece: adrenal     Social History:   Social History     Social History Narrative    She lives in senior housing in [place] x2 months. he has 4 sisters, 2 nearby and involved in care. She has 1 son-[name], recently married 50 yo, who lives about an hour and a half away in [place]. He works full-time  as a Sales executive. She is still a chronic smoker (1ppd). She is currently divorced.         Goes to [employer] in [place] and gets a lot of support. Her church friend [name] and her sisters goes with her to chemo appts.     Physical Examination:   Vital Signs: LMP  (LMP Unknown)  See flow sheet  General: Chronically ill appearing female in no acute distress.    HEENT: Well-healed (R) frontal scalp scar. EOMI. Sclerae anicteric.   Neck: Supple. No cervical or supraclavicular adenopathy.   Pulm: Lungs clear to auscultation bilat; breathing non-labored.  Cardiac: Regular rate and rhythm; no LE edema.    Abdominal: Mild tenderness and fullness to RUQ, over liver.   Musculoskeletal: Tenderness to b/l lower scapula.   Breasts/chest: (R) breast: Small ulcer laterally to redeveloping nipple. Slight amount of slough. (L) breast exam: Port to the upper left chest.  Spider nevi to the central chest.   Neurologic:  Alert and oriented. Grossly non-focal  Lymphatic: No cervical or supraclavicular adenopathy. No palpable right axillary adenopathy.  Skin: Multiple skin lesion including petechiae and spider angiomata outlined bellow.  Extremity: Callus on right foot.     DATA REVIEW:    Pertinent labs/imaging/pathology reviewed in detail.    Scribe Statement: Documentation assistance was provided by me personally, Norwood Levo, a scribe. Olivia Mackie, MD obtained and performed the history, physical exam and medical decision making elements that were entered into the chart. Signed by Norwood Levo, 05/03/21 at 4:33 PM    Provider Attestation: Documentation assistance was provided by the Scribe, Norwood Levo. I was present during the time the encounter was recorded. The information recorded by the Scribe was done at my direction and has been reviewed and validated by me. Signed by Olivia Mackie, MD 05/03/21 at 4:33 PM

## 2021-04-28 NOTE — Unmapped (Signed)
Tried to call patient regarding results of perfusion/SPECT CT with no evidence of PE. Will send her a message on Mychart.

## 2021-04-29 MED ORDER — CLONAZEPAM 0.5 MG TABLET
ORAL_TABLET | 0 refills | 0 days | Status: CP
Start: 2021-04-29 — End: ?

## 2021-04-29 NOTE — Unmapped (Signed)
On for 6/3 @ 8:40 scheduled with her sister

## 2021-05-03 ENCOUNTER — Ambulatory Visit: Admit: 2021-05-03 | Discharge: 2021-05-04 | Payer: MEDICARE

## 2021-05-03 ENCOUNTER — Other Ambulatory Visit: Admit: 2021-05-03 | Discharge: 2021-05-04 | Payer: MEDICARE

## 2021-05-03 ENCOUNTER — Ambulatory Visit
Admit: 2021-05-03 | Discharge: 2021-05-04 | Payer: MEDICARE | Attending: Geriatric Medicine | Primary: Geriatric Medicine

## 2021-05-03 DIAGNOSIS — G629 Polyneuropathy, unspecified: Principal | ICD-10-CM

## 2021-05-03 DIAGNOSIS — R110 Nausea: Principal | ICD-10-CM

## 2021-05-03 DIAGNOSIS — Z515 Encounter for palliative care: Principal | ICD-10-CM

## 2021-05-03 DIAGNOSIS — K900 Celiac disease: Principal | ICD-10-CM

## 2021-05-03 DIAGNOSIS — C50811 Malignant neoplasm of overlapping sites of right female breast: Principal | ICD-10-CM

## 2021-05-03 DIAGNOSIS — G8929 Other chronic pain: Principal | ICD-10-CM

## 2021-05-03 DIAGNOSIS — M545 Chronic low back pain without sciatica, unspecified back pain laterality: Principal | ICD-10-CM

## 2021-05-03 DIAGNOSIS — C50919 Malignant neoplasm of unspecified site of unspecified female breast: Principal | ICD-10-CM

## 2021-05-03 LAB — CBC W/ AUTO DIFF
BASOPHILS ABSOLUTE COUNT: 0 10*9/L (ref 0.0–0.1)
BASOPHILS RELATIVE PERCENT: 0.2 %
EOSINOPHILS ABSOLUTE COUNT: 0 10*9/L (ref 0.0–0.5)
EOSINOPHILS RELATIVE PERCENT: 0.9 %
HEMATOCRIT: 33.5 % — ABNORMAL LOW (ref 34.0–44.0)
HEMOGLOBIN: 11.9 g/dL (ref 11.3–14.9)
LYMPHOCYTES ABSOLUTE COUNT: 1.5 10*9/L (ref 1.1–3.6)
LYMPHOCYTES RELATIVE PERCENT: 29.7 %
MEAN CORPUSCULAR HEMOGLOBIN CONC: 35.4 g/dL (ref 32.0–36.0)
MEAN CORPUSCULAR HEMOGLOBIN: 34.6 pg — ABNORMAL HIGH (ref 25.9–32.4)
MEAN CORPUSCULAR VOLUME: 97.7 fL — ABNORMAL HIGH (ref 77.6–95.7)
MEAN PLATELET VOLUME: 7.8 fL (ref 6.8–10.7)
MONOCYTES ABSOLUTE COUNT: 0.6 10*9/L (ref 0.3–0.8)
MONOCYTES RELATIVE PERCENT: 11.4 %
NEUTROPHILS ABSOLUTE COUNT: 2.8 10*9/L (ref 1.8–7.8)
NEUTROPHILS RELATIVE PERCENT: 57.8 %
PLATELET COUNT: 105 10*9/L — ABNORMAL LOW (ref 150–450)
RED BLOOD CELL COUNT: 3.43 10*12/L — ABNORMAL LOW (ref 3.95–5.13)
RED CELL DISTRIBUTION WIDTH: 14.3 % (ref 12.2–15.2)
WBC ADJUSTED: 4.9 10*9/L (ref 3.6–11.2)

## 2021-05-03 MED ORDER — BUPRENORPHINE 5 MCG/HOUR WEEKLY TRANSDERMAL PATCH
MEDICATED_PATCH | TRANSDERMAL | 0 refills | 28 days | Status: CP
Start: 2021-05-03 — End: 2021-05-31

## 2021-05-03 MED ADMIN — trastuzumab (HERCEPTIN) 380 mg in sodium chloride (NS) 0.9 % 250 mL IVPB: 6 mg/kg | INTRAVENOUS | @ 22:00:00 | Stop: 2021-05-03

## 2021-05-03 MED ADMIN — heparin, porcine (PF) 100 unit/mL injection 500 Units: 500 [IU] | INTRAVENOUS | @ 22:00:00 | Stop: 2021-05-04

## 2021-05-03 MED ADMIN — sodium chloride (NS) 0.9 % infusion: 100 mL/h | INTRAVENOUS | @ 22:00:00

## 2021-05-03 NOTE — Unmapped (Addendum)
OUTPATIENT ONCOLOGY PALLIATIVE CARE    Principal Diagnosis: Brooke [name] is a 65 y.o. female with metastatic breast cancer,  diagnosed in 2017.  Disease sites include lung and brain.     Assessment/Plan:   1.  Painful neuropathic hands and feet-stable and worsening mid back pain.  Status post fall last week with pain to left knee and lower back-overall improving.  Hx of cervical thoracic paraspinal trigger point bilateral this past Feb.     -Continue lyrica 200 mg bid dosing.  -Stop MS Contin.  -Start buprenorphine 5 mcg patch weekly  -Continue Effexor 225 mg every day  -Continue diclofenac gel as needed  -Not able to tolerate NSAIDs (due to platelets) and tylenol (recent elevated liver enzymes)    Opioid use in past:  -Had difficulty tolerating the methadone 2.5 mg dosing due to sedation.  Did not try the 1 mg methadone, so could consider this in the future if needed for the neuropathic discomfort.  Some reluctance in re-starting methadone.  -No relief with tramadol  -oxycodone-night terrors.    2.  Support-thinks that she is struggling at times to remember things.  Comments that she feels at some point she is going to need more help to live independently.  Brooke [name], MSW did get [employer] support, but Brooke [name] help has not connected with Brooke [name] for in-home support.  Moments of tearfulness due to considering what her future may be in the future.  -Will follow-up to get the [name] health team out for more support.  -Will connect with Brooke [name] for support-sure if the plan is to get additional neuropsychiatric testing.  -Provided emotional support.      3.  Goals of care-not addressed today.    At prior visits: goals which are to decrease pain and to maintain her independence. Lives alone and Brooke [name] lives close by.     Wants to ensure that her quality of life remains high and the wish to keep on going.  Shared concern that she does not want to be in pain.  NP provided reassurance that our team can offer her different treatment strategies to help her with her goal.     Advance care planning-see advance care planning note dated 01/31/2020.  -at prior visits: Patient shared that she has been dreaming about death. She does have her funeral arrangements completed. Shared that she has her wishes written down and her Sister Brooke [name] knows where they are regarding her funeral. She still contemplating whether to be cremated or buried and she is leaning toward cremation. patient currently focused on cancer directed therapy.  Prefers to stay in the moment and not discussed things.  We will continue to support and address if she has a change in clinical condition.        ???Advance directives scanned in on 11/02/19        HCDM Brooke [name]): Brooke [name] - Sister - [phone]    HCDM, [facility] AlternateJaneece [name] - Sister - [phone]    At prior visits,   # Controlled substances risk management.  We are not currently prescribing controlled medications for her.   - Patient has a signed pain medication agreement with Outpt Palliative care, completed on 11/01/18, as per standard care. This was signed again on 01/28/19   - [place] database was reviewed today and it was appropriate.   - Urine drug screen was not performed at this visit. Findings: not applicable.   - Patient has received information about safe storage and administration of  medications.   - Patient has received a prescription for narcan and shared with Brooke [name] and pt.       F/u: 1 month phone    ----------------------------------------  Referring Provider: From inpatient oncology team  Oncology Team: Breast team-Brooke [name]  PCP: Brooke Milliner, MD      HPI: 65 year old woman with her to overexpressing metastatic breast cancer to her lung and brain.  Was found to have multiple small brain metastases and completed CyberKnife therapy on September 11. Describes ongoing pain in her pelvis, rates it as a 3 out of 10 today.  Has been improving over the last week or 2.  Feels like gabapentin has been helpful, and is also responded well to Tylenol and ibuprofen in the past.  She is taken oxycodone and it gave her night terrors does not want to take it again.  Has taken Dilaudid previously and did not have side effects from this.    Current cancer-directed therapy: capecitabine (Xeloda), tucatinib, and trastuzumab (Herceptin).       Interval hx 03/29/21 Brooke [name], Brooke [name] and Brooke [name]    -CT scans on 01/28/21: Diffuse bone demineralization. Multilevel degenerative changes of the thoracolumbar spine with intervertebral disc space collapse at the L5-S1 level. There is grade 1 anterolisthesis of L5 on S1 with bilateral pars defects. Ill-defined area of low attenuation in the left hepatic lobe, concerning for underlying lesion versus focal fat. Further evaluation with nonemergent MRI is recommended. Hepatic steatosis.  -MRI of abd -area in question on the prior CT and ultrasound likely represents a focal area of hepatic steatosis.  -Bursts of energy. Had been more active and doing more activity in last couple of days.  -Mid Back is hurting again. Pushing sensation. Radiates to front and has epigastric pulling sensation.  -Took 1 norco yesterday. Averaging 2-3 norcos/week.  -Sleeps on a stock of pillows. HT is ok.   -Toes sensation like needles-overall neuropathy is stable.   -Most nights not sleeping well. Is taking trazodone 75 mg hs.   -Wake up coughing intermittent cough-thinks it is pollen.  -BMs every day to every other day. Using senokot prn. Did have diarrhea, but not now  -Mood good, may be day of some sadness, but not prolonged  -Vision ok for now  -Intentional wgt loss. Feels better being lighter.     Interval hx 05/03/21 [facility], Brooke [name] and Brooke [name]    -While taking out the trash last week had a misstep and fell on her buttocks and hands.  Does have a left knee contusion and did put ice on this afterward.  Her lower back remains a little sore feels that she is walking okay and does not need x-rays.  -Overall feels more anxiety and contributes some of this to her memory not being as good as she like it to be.  Continues to live alone and do her own shopping and taking care of at home.  -Appetite is okay, no nausea.  -At times has diarrhea but relief with Lomotil.  If she has constipation she will take the Senokot.  -Neuropathic pain continues to be a problem in her feet.  Continues to use the Lyrica twice a day.  -Did not like how the morphine made her feel and does not wish to take this in the future.  -Denies alcohol use.  -Sleep is okay and using the trazodone 75 mg at at bedtime.    [facility] Outpatient Oncology Palliative Care  [place] Symptom Assessment System-revised    Please insert  the number for each symptom bellow:  Symptoms Severity 0=Best & 10=Worst    Pain Number: 4    Tiredness Number: 8   Drowsiness Number: 8   Nausea Number: 0   Lack of Appetite Number: 3   Shortness of Breath Number: 4   Depression Number: 8   Anxiety Number: 9   Wellbeing Number: 5   Other Problem: na Number: 0     For the following questions please circle the number between 1 and 7 that best applies to you.     How would you rate your overall quality of life during the past week? (1=Very Poor; 7=Excellent)  Number: 1           Palliative Performance Scale: 80% - Ambulation: Full / Normal Activity with effort, some evidence of disease / Self-Care:Full / Intake: Normal or reduced / Level of Conscious: Full         Coping/Support Issues: Patient reports she is been able to attend church functions and is finding this very helpful. Has boyfriend Brooke [name] for the last 15 years.     At prior visits, patient did share that she continues to receive much help from her church friend, [name] and her husband.  They prayed together and she is found this supportive.  She states that her 2 sisters have been supportive, but she feels that they are becoming more less patient with her due to patient's irritability.     Overall coping well, no specific issues identified.  Finding support with her church community, her sister, Brooke [name], and prayer.         Social History: Pt lives in senior housing in [place] month, says it's ok and quiet but she is from [place] and liked it there more. She has 4 sisters, 2 nearby and involved in care. She has 1 son-[name], 35yo, who lives about an hour and a half away in [place]. She is divorced.  ??  Goes to [employer] in [place] and gets a lot of support. Her church friend [name] goes with her to chemo appts. Her sisters will go too.    Advance Care Planning: Advance directives scanned into system  HCPOA: See ACP note  Living Will: See ACP note  ACP note: Yes, advance directive scanned in on November 6    Objective     Opioid Risk Tool:      Opioid Risk Tool:   Female  Female    Family history of substance abuse      Alcohol   1  3    Illegal drugs  2  3    Rx drugs  4  4    Personal history of substance abuse      Alcohol  3  3    Illegal drugs  4  4    Rx drugs  5  5    Age between 18???45 years  1  1    History of preadolescent sexual abuse  3 0    Psychological disease      ADD, OCD, bipolar, schizophrenia  2  2    Depression  1  1    Total: 7  (<3 low risk, 4-7 moderate risk, >8 high risk)      Oncology History Overview Note   Identifying Statement:  Astoria Condon is a 65 y.o. female diagnosed with a right posterior frontal lobe metastasis (breast primary) status post resection 01/15/2019 and 5/5 fractions of SBRT (2500 Gy via  CyberKnife) to the resection cavity.    Treatment History:  09/05/17: S/p SRS to 7 lesions  01/15/19: S/p resection, followed by SRS 2500 cGy   03/04/19: KPS 80; MRI with postsurgical changes versus residual disease; RTC in 2 weeks w/ MRI   04/15/19: KPS NA; MRI w/ SD; no study; RTC 6 weeks w/ MRI   06/10/19: KPS 80; MRI w/ SD; RTC 4 weeks  07/15/19: KPS 80; MRI w/ SD; RTC 8 weeks  08/19/19: KPS 80; start nortriptyline for HAs; RTC 4 weeks w/ MRI  09/16/19: KPS 80; con't nortriptyline for HAs; RTC 2 mos w/ MRI  11/11/19: KPS 80; MRI w/ SD though w/ small infarct; proceed w/ CVA workup; start 81mg  ASA; encouraged smoking cessation; con't nortriptyline; RTC to discuss results  11/25/19: KPS 80; discussed CVA workup results; MRA unremarkable; con't smoking cessation efforts; con't nortriptyline for HAs; con't 81mg  ASA, start Lipitor; f/u with PCP for COPD eval; RTC 2 mos w/ MRI     Malignant neoplasm of overlapping sites of right female breast (CMS-HCC)   2017 -  Presenting Symptoms    Large open wound RT breast which began as nipple inversion. Patient states that she ignored for a long time.  Physical exam of the area of concern in the RTbreast demonstrates a large open wound in the lateral right breast. Saw PCP 01/27/16 Rx Keflex.     02/04/2016 Interval Scan(s)    MMG/US: 3.1 (3.0) cm irregular spiculated dense mass in lateral Rt breast w nipple and skin retraction. 2 subcentimeter irregular masses in the RUOQ  (10', 11') (possible satellite lesions). Large Rt axillary lymph node 1.7. Lt breast clear. BIRAD 5.     02/08/2016 Biopsy    US guided Rt. Axillary LN biopsy:  Gr 3, IDC with apocrine features. ER (-), PR (31-40), HER-2 (3+). Noted to have multiple skin lesions. Poor access to breast mass due open 10-15 cm wound risk of pain and bleeding.     02/09/2016 Initial Diagnosis    Malignant neoplasm of overlapping sites of right female breast (RAF-HCC)     02/10/2016 Interval Scan(s)    CT CAP: Large, necrotic right breast mass with wide open tract to the skin, consistent with known right breast malignancy.  Numerous enlarged right axillary, subpectoral, mediastinal, bilateral hilar lymph nodes and innumerable bilateral pulmonary nodules     02/10/2016 Interval Scan(s)    NM Bone scan: No osseous metastatic disease.     04/28/2016 - 03/10/2020 Chemotherapy    OP TRASTUZUMAB (EVERY 21 DAYS)  Trastuzumab 8 mg/kg loading then 6 mg/kg every 21 days     07/13/2016 Genetics    STRATA  ??? ERBB2 copy number alteration  Estimated copy number: 40, confidence interval: 35.7 - 44.0, cellularity: 50%  Associated FDA-approved targeted therapies in breast cancer: trastuzumab, lapatinib, ado-trastuzumab emtansine, pertuzumab  ??? PIK3CA [id number]     01/03/2020 -  Cancer Staged    CT CAP 01/03/2020 which showed a new lingular opacity measuring 1.9 cm in the right lung. There was also slight increase in the right breast mass from 1.2-> 1.5 cm.  Bone scan shows no osseous metastases. Overall I considered these findings indeterminate and ppted to repeat CT CAP in 1 month     02/03/2020 -  Cancer Staged    CT chest 02/03/20 which showed that the lingular lesion has persisted and increased in size.  No mediastinal adenopathy reported.  She continues to report a nonproductive cough and mild dyspnea.  She  is a chronic smoker and currently smokes on average half a pack per day.  My major concern is that this could be a second primary lung cancer as her other sites of diseases stable on current systemic therapy       02/03/2020 Endocrine/Hormone Therapy    Stop Tamoxifen, Add Fasoldex, continue Q3week Hercpetin     03/23/2020 - 02/09/2021 Chemotherapy    OP BREAST ADO-TRASTUZUMAB EMTANSINE  ado-trastuzumab emtansine 3.6 mg/kg IV on day 1, every 21 days     04/12/2021 -  Chemotherapy    OP TRASTUZUMAB (EVERY 21 DAYS)  trastuzumab 8 mg/kg IV LOADING, then 6 mg/kg IV MAINT, every 21 days     Brain metastases (CMS-HCC)   08/07/2017 Initial Diagnosis    Brain metastases (CMS-HCC)    Summary of Radiosurgery   Rx:7 brain met: 09/05/2017: 2,000/2,000 cGy  Sec:R Frontal: 09/05/2017: 2,000 cGy  Sec:L Post Fr: 09/05/2017: 2,000 cGy  Sec:L Ant Fro: 09/05/2017: 2,000 cGy  Sec:R Sup Oc: 09/05/2017: 2,000 cGy  Sec:L [place]: 09/05/2017: 2,000 cGy  Sec:R Inf Occi: 09/05/2017: 2,000 cGy  Sec:L Tempor: 09/05/2017: 2,000 cGy  Rx:Lt Med Oc: 09/13/2018: 2,000/2,000 cGy  Rx:Rt Frontal: : 2,500/2,500 cGy    01/15/2019: Craniotomy and resection of right posterior frontal lobe metastasis. Followed by [facility]    03/04/19: KPS 80; MRI with postsurgical changes versus residual disease; RTC in 2 weeks w/ MRI     01/03/2020 -  Cancer Staged    CT CAP 01/03/2020 which showed a new lingular opacity measuring 1.9 cm in the right lung. There was also slight increase in the right breast mass from 1.2-> 1.5 cm.  Bone scan shows no osseous metastases. Overall I considered these findings indeterminate and ppted to repeat CT CAP in 1 month     02/03/2020 -  Cancer Staged    CT chest 02/03/20 which showed that the lingular lesion has persisted and increased in size.  No mediastinal adenopathy reported.  She continues to report a nonproductive cough and mild dyspnea.  She is a chronic smoker and currently smokes on average half a pack per day.  My major concern is that this could be a second primary lung cancer as her other sites of diseases stable on current systemic therapy       02/03/2020 Endocrine/Hormone Therapy    Stop Tamoxifen, Add Fasoldex, continue Q3week Hercpetin     Metastatic breast cancer (CMS-HCC)   01/25/2019 Initial Diagnosis    Metastatic breast cancer (CMS-HCC)     03/23/2020 - 02/09/2021 Chemotherapy    OP BREAST ADO-TRASTUZUMAB EMTANSINE  ado-trastuzumab emtansine 3.6 mg/kg IV on day 1, every 21 days     04/12/2021 -  Chemotherapy    OP TRASTUZUMAB (EVERY 21 DAYS)  trastuzumab 8 mg/kg IV LOADING, then 6 mg/kg IV MAINT, every 21 days         Patient Active Problem List   Diagnosis   ??? Malignant neoplasm of overlapping sites of right female breast (CMS-HCC)   ??? Peripheral neuropathy   ??? Insomnia   ??? Brain metastases (CMS-HCC)   ??? Nausea   ??? Closed fracture of one rib with routine healing   ??? Diarrhea of presumed infectious origin   ??? Failure to thrive in adult   ??? Tobacco use   ??? Chronic diarrhea   ??? Depression   ??? Hyponatremia   ??? Back pain   ??? Closed fracture of multiple pubic rami, right, initial encounter (CMS-HCC)   ??? Macrocytosis   ???  Pelvic fracture (CMS-HCC)   ??? Metastatic breast cancer (CMS-HCC)   ??? Migraine without aura and without status migrainosus, not intractable   ??? Essential hypertension   ??? CVA (cerebral vascular accident) (CMS-HCC)   ??? DDD (degenerative disc disease), cervical   ??? Wheezing   ??? Dyslipidemia   ??? Angina pectoris (CMS-HCC)   ??? Papillary fibroelastoma of heart   ??? Antineoplastic chemotherapy induced anemia   ??? Celiac disease   ??? Iron deficiency anemia due to chronic blood loss   ??? Dry eye syndrome, bilateral   ??? Meibomian gland dysfunction (MGD) of both eyes   ??? Glaucoma suspect of both eyes   ??? Incipient cataract of both eyes       Past Medical History:   Diagnosis Date   ??? Abnormal ECG unsure   ??? Alcoholism (CMS-HCC)    ??? Allergic rhinitis    ??? Anemia    ??? Anxiety     insomnia   ??? Arthritis not sure   ??? Brain concussion    ??? Breast cancer (CMS-HCC)     chemo for now with mets   ??? COPD (chronic obstructive pulmonary disease) (CMS-HCC)    ??? Depression    ??? Dysphagia    ??? Genitourinary disease    ??? GERD (gastroesophageal reflux disease)    ??? Headache    ??? HTN (hypertension)    ??? Hyperlipidemia    ??? Insomnia    ??? 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 Stroke (CMS-HCC) Nov. 2020    Dr. Theodoro Kalata   ??? Tobacco dependence        Past Surgical History:   Procedure Laterality Date   ??? CESAREAN SECTION     ??? CHOLECYSTECTOMY     ??? FINGER SURGERY Right     trigger finger   ??? GANGLION CYST EXCISION Left     hand   ??? HYSTERECTOMY     ??? LYMPH NODE BIOPSY Right 02/08/2016    Axillary   ??? PR BRNSCHSC TNDSC EBUS DX/TX INTERVENTION PERPH LES Left 03/05/2020    Procedure: Bronch, Rigid Or Flexible, Including Fluoro Guidance, When Performed; With Transendoscopic Ebus During Bronchoscopic Diagnostic Or Therapeutic Intervention(S) For Peripheral Lesion(S);  Surgeon: Jerelyn Charles, MD;  Location: MAIN OR [name];  Service: Pulmonary   ??? PR BRONCHOSCOPY,COMPUTER ASSIST/IMAGE-GUIDED NAVIGATION Left 03/05/2020    Procedure: BRONCHOSCOPY, RIGID OR FLEXIBLE, INCLUDE FLUORO WHEN PERFORMED; W/COMPUTER-ASSIST, IMAGE-GUIDED NAVIGATION;  Surgeon: Jerelyn Charles, MD;  Location: MAIN OR [facility];  Service: Pulmonary   ??? PR BRONCHOSCOPY,DIAGNOSTIC W LAVAGE Left 03/05/2020    Procedure: Bronchoscopy, Rigid Or Flexible, Include Fluoroscopic Guidance When Performed; W/Bronchial Alveolar Lavage;  Surgeon: Jerelyn Charles, MD;  Location: MAIN OR [facility];  Service: Pulmonary   ??? PR BRONCHOSCOPY,TRANSBRON ASPIR BX Left 03/05/2020    Procedure: Bronchoscopy, Rigid/Flex, Incl Fluoro; W/Transbronch Ndl Aspirat Bx, Trachea, Main Stem &/Or Lobar Bronchus;  Surgeon: Jerelyn Charles, MD;  Location: MAIN OR [name];  Service: Pulmonary   ??? PR BRONCHOSCOPY,TRANSBRONCH BIOPSY Left 03/05/2020    Procedure: Bronchoscopy, Rigid/Flexible, Include Fluoro Guidance When Performed; W/Transbronchial Lung Bx, Single Lobe;  Surgeon: Jerelyn Charles, MD;  Location: MAIN OR [facility];  Service: Pulmonary   ??? PR CATH PLACE/CORON ANGIO, IMG SUPER/INTERP,W LEFT HEART VENTRICULOGRAPHY N/A 05/07/2020    Procedure: Left Heart Catheterization;  Surgeon: Lesle Reek, MD;  Location: [facility] CATH;  Service: Cardiology   ??? PR COLONOSCOPY W/BIOPSY SINGLE/MULTIPLE N/A 10/21/2016  Procedure: COLONOSCOPY, FLEXIBLE, PROXIMAL TO SPLENIC FLEXURE; WITH BIOPSY, SINGLE OR MULTIPLE;  Surgeon: Monte Fantasia, MD;  Location: GI PROCEDURES [facility] [facility];  Service: Gastroenterology   ??? PR EXCIS SUPRATENT BRAIN TUMOR Right 01/15/2019    Procedure: CRANIECTOMY; EXC BRAIN TUMOR-SUPRATENTORIAL;  Surgeon: Edison Simon, MD;  Location[facility];  Service: Neurosurgery   ??? PR INCISE FINGER TENDON SHEATH Left 06/17/2019    Procedure: R-20 TENDON SHEATH INCISION (EG, FOR TRIGGER FINGER);  Surgeon: Daisy Lazar, MD;  Location: ASC OR [facility];  Service: Orthopedics   ??? PR MICROSURG TECHNIQUES,REQ OPER MICROSCOPE Right 01/15/2019    Procedure: MICROSURGICAL TECHNIQUES, REQUIRING USE OF OPERATING MICROSCOPE (LIST SEPARATELY IN ADDITION TO CODE FOR PRIMARY PROCEDURE);  Surgeon: Edison Simon, MD;  Location: MAIN OR [facility];  Service: Neurosurgery   ??? PR STEREOTACTIC COMP ASSIST PROC,CRANIAL,INTRADURAL Right 01/15/2019    Procedure: STEREOTACTIC COMPUTER-ASSISTED (NAVIGATIONAL) PROCEDURE; CRANIAL, INTRADURAL;  Surgeon: Edison Simon, MD;  Location: MAIN OR [facility];  Service: Neurosurgery   ??? PR UPPER GI ENDOSCOPY,BIOPSY N/A 10/21/2016    Procedure: UGI ENDOSCOPY; WITH BIOPSY, SINGLE OR MULTIPLE;  Surgeon: Monte Fantasia, MD;  Location: [facility] PROCEDURES [facility] [facility];  Service: Gastroenterology   ??? PR UPPER GI ENDOSCOPY,BIOPSY N/A 04/02/2018    Procedure: UGI ENDOSCOPY; WITH BIOPSY, SINGLE OR MULTIPLE;  Surgeon: Wendall Papa, MD;  [facility];  Service: Gastroenterology   ??? SKIN BIOPSY         Current Outpatient Medications   Medication Sig Dispense Refill   ??? albuterol HFA 90 mcg/actuation inhaler Inhale 2 puffs every six (6) hours as needed for wheezing.     ??? aluminum-magnesium hydroxide-simethicone 200-200-20 mg/5 mL Susp 80 mL, diphenhydrAMINE 12.5 mg/5 mL Liqd 200 mg, nystatin 100,000 unit/mL Susp 8,000,000 Units, distilled water Liqd 80 mL, lidocaine 2% viscous 2 % Soln 80 mL Take 5 mL by mouth every six (6) hours as needed. Swish, gargle, spit or swallow if throat issues 80 mL 2   ??? capecitabine (XELODA) 500 MG tablet Take 3 tablets (1,500 mg total) by mouth Two (2) times a day . (Patient taking differently: Take 1,500 mg by mouth Two (2) times a day . Has not started taking yet (new prescription).) 84 tablet 3   ??? clonazePAM (KLONOPIN) 0.5 MG tablet Take 0.5 mg daily as needed for anxiety.  Do not exceed 0.5 mg/day. 30 tablet 0   ??? diphenoxylate-atropine (LOMOTIL) 2.5-0.025 mg per tablet Take 1 tablet by mouth 4 (four) times a day as needed for diarrhea. 30 tablet 1   ??? dorzolamide (TRUSOPT) 2 % ophthalmic solution Administer 1 drop into the left eye Three (3) times a day. 10 mL 12   ??? esomeprazole (NEXIUM) 40 MG capsule Take 1 capsule (40 mg total) by mouth daily. 90 capsule 3   ??? lisinopriL-hydrochlorothiazide (PRINZIDE,ZESTORETIC) 20-25 mg per tablet Take 1 tablet by mouth daily. 90 tablet 3   ??? MAGNESIUM ORAL Take by mouth daily. OTC     ??? naloxone (NARCAN) 4 mg nasal spray One spray in either nostril once for known/suspected opioid overdose. May repeat every 2-3 minutes in alternating nostril til EMS arrives 2 each 0   ??? ondansetron (ZOFRAN-ODT) 8 MG disintegrating tablet Take 1 tablet (8 mg total) by mouth every twelve (12) hours as needed for nausea. (Patient taking differently: Take 8 mg by mouth every twelve (12) hours as needed for nausea. Patient has not started taking yet (will take with chemo))  30 tablet 1   ??? potassium chloride (KLOR-CON) 10 MEQ CR tablet Take 2 tablets (20 mEq total) by mouth daily. (Patient taking differently: Take 10 mEq by mouth Two (2) times a day. ) 60 tablet 2   ??? pregabalin (LYRICA) 200 MG capsule Take 1 capsule (200 mg total) by mouth Two (2) times a day. 60 capsule 2   ??? prochlorperazine (COMPAZINE) 10 MG tablet Take 1 tablet (10 mg total) by mouth every six (6) hours as needed for nausea. 30 tablet 6   ??? senna (SENOKOT) 8.6 mg tablet Take 3 tablets by mouth daily.     ??? traZODone (DESYREL) 50 MG tablet Take 1.5 tablets (75 mg total) by mouth nightly. 45 tablet 1   ??? tucatinib (TUKYSA) 150 mg tablet Take 2 tablets (300 mg total) by mouth two (2) times a day . (Patient taking differently: Take 300 mg by mouth two (2) times a day . Has not started taking yet (new prescription)) 120 tablet 2   ??? venlafaxine (EFFEXOR-XR) 150 MG 24 hr capsule Take 150 mg capsule in conjunction with 75 mg capsule for a total of 225 mg by mouth daily 30 capsule 2   ??? venlafaxine (EFFEXOR-XR) 75 MG 24 hr capsule Take 75 mg capsule in conjunction with 150 mg capsule by mouth daily for a total of 225 mg by mouth daily 30 capsule 2   ??? clobetasoL (TEMOVATE) 0.05 % ointment Apply twice a day to rash on palm until smooth/clear. (Patient not taking: Reported on 05/03/2021) 30 g 1   ??? diclofenac sodium (VOLTAREN) 1 % gel Apply 2 g topically four (4) times a day as needed for arthritis or pain. (Patient not taking: Reported on 04/06/2021) 150 g 2   ??? folic acid (FOLVITE) 1 MG tablet Take 1 tablet (1 mg total) by mouth daily. (Patient not taking: Reported on 05/03/2021) 100 tablet 3   ??? lasmiditan 50 mg Tab Take 50 mg by mouth once as needed (Once daily in 24hr period PRN migraine) for up to 1 dose. (Patient not taking: Reported on 03/29/2021) 7 tablet 1   ??? MORPhine 100 mg/5 mL (20 mg/mL) concentrated solution Take 0.5 mL (10 mg total) by mouth every eight (8) hours as needed for pain. (Patient not taking: Reported on 05/03/2021) 15 mL 0   ??? nicotine (NICODERM CQ) 21 mg/24 hr patch Place 1 patch on the skin daily. (Patient not taking: Reported on 03/29/2021) 28 patch 2   ??? nicotine polacrilex (NICORETTE) 4 MG gum Apply 1 each (4 mg total) to cheek every hour as needed for smoking cessation. (Patient not taking: Reported on 03/29/2021) 110 each 2   ??? polyethylene glycol (MIRALAX) 17 gram/dose powder Take 17 g by mouth Three (3) times a day as needed (constipation). (Patient not taking: Reported on 04/06/2021) 850 g 3   ??? saliva stimulant comb. no.3 (BIOTENE MOISTURIZING MOUTH) Spry Take 1 spray by mouth every two (2) hours as needed (dry mouth). (Patient not taking: Reported on 05/03/2021) 44.3 mL prn   ??? thiamine HCl (VITAMIN B-1 ORAL) Take by mouth daily. (Patient not taking: Reported on 05/03/2021)       No current facility-administered medications for this visit.       Allergies:   Allergies   Allergen Reactions   ??? Adhesive Rash   ??? Decadron [Dexamethasone] Anxiety     Mania as well   ??? Morphine Itching   ??? Tetracycline      Other reaction(s):  Other (See Comments)   ??? Oxycodone      Night terrors   ??? Tegaderm Adhesive-No Drug-Allergy Check Rash       Family History:  Cancer-related family history includes Cancer in her father, maternal grandfather, maternal uncle, maternal uncle, and paternal aunt.  She indicated that the status of her mother is unknown. She indicated that the status of her father is unknown. She indicated that the status of her brother is unknown. She indicated that the status of her maternal grandmother is unknown. She indicated that the status of her maternal grandfather is unknown. She indicated that the status of her paternal grandmother is unknown. She indicated that the status of her paternal grandfather is unknown. She indicated that the status of her maternal aunt is unknown. She indicated that the status of her paternal uncle is unknown. She indicated that the status of her neg hx is unknown. She indicated that the status of her other is unknown.    Vital signs for this encounter: VS reviewed in EPIC.  GEN: Awake and alert & in no acute distress  PSYCH: Alert and oriented to person, place and time. Euthymic.  HEENT: Pupils equally round without scleral icterus. No facial asymmetry.  LUNGS: No increased work of breathing.  SKIN: No rashes, petechiae or jaundice noted on visible skin  EXT: No edema noted of the lower extremities. Contusion on left medial knee.  NEURO: Normal gait and coordination.         Lab Results   Component Value Date    CREATININE 0.62 04/06/2021     Lab Results   Component Value Date    ALKPHOS 505 (H) 04/06/2021    BILITOT 0.6 04/06/2021    BILIDIR 0.30 03/29/2021    PROT 7.6 04/06/2021    ALBUMIN 3.8 04/06/2021    ALT 64 (H) 04/06/2021    AST 100 (H) 04/06/2021             Burtis Junes, FNP-BC, [facility]  Outpatient Oncology Palliative Care Service  [facility]  [address] [place], [place] [address]  [phone]         Time spent with patient was 35 minutes.  Additional 15 minutes were spent on preparation, documenting and coordinating care.

## 2021-05-03 NOTE — Unmapped (Signed)
Patient stated that she had a fall weekend before last. She was getting in the car and didn't grab on to the car door good enough and she fell.She hurt her left leg and thigh, her back as well. But she is ok.

## 2021-05-03 NOTE — Unmapped (Addendum)
It was a pleasure to see you today in the Medical Oncology Clinic.      It is two weeks on and 1 week off of Xeloda. You had your week off last week so start cycle 2 today.     Take your Tucatinib every day.     Please call our nurse navigator, Modena Jansky, if you have any interval questions or concerns:     For appointments, please call [phone]     Nurse Navigator:   Modena Jansky, RN: phone: [phone]     Prescription refills: [phone]     FAX: [phone]     For emergencies, evenings or weekends, please call [phone] and ask for the oncology fellow on call.     Reasons to call emergency line may include:   Fever of 100.5 or greater   Nausea and/or vomiting not relieved with nausea medicine   Diarrhea or constipation not relieved with bowel regimen   Severe pain not relieved with usual pain regimen     Labs from today:  Lab Results   Component Value Date    WBC 4.9 05/03/2021    HGB 11.9 05/03/2021    HCT 33.5 (L) 05/03/2021    MCV 97.7 (H) 05/03/2021    PLT 105 (L) 05/03/2021       Lab Results   Component Value Date    NEUTROABS 2.8 05/03/2021       Future Appointments   Date Time Provider Department Center   05/03/2021  4:30 PM ONCINF CHAIR 07 [id number] [facility]   05/04/2021 10:00 AM Deon Pilling Biggers, MS [id number] [facility]   05/17/2021  1:20 PM Adaline Sill, MD OPHTHTNELS [facility]   05/24/2021 11:30 AM ONCINF CHAIR 09 [id number] [facility]   05/28/2021  8:40 AM Lesle Reek, MD UNCHRTVASET [facility]   09/21/2021 11:30 AM Janyth Pupa, MD [facility]

## 2021-05-03 NOTE — Unmapped (Unsigned)
Port accessed and labs drawn with no complications by Jules Husbands flushed with saline.

## 2021-05-04 ENCOUNTER — Ambulatory Visit: Admit: 2021-05-04 | Payer: MEDICARE

## 2021-05-04 MED FILL — TUKYSA 150 MG TABLET: ORAL | 30 days supply | Qty: 120 | Fill #1

## 2021-05-04 NOTE — Unmapped (Signed)
Pt to clinic for treatment.  Herceptin completed, pt tolerated well.  Port de-accessed.  Pt left ambulatory with family.

## 2021-05-04 NOTE — Unmapped (Signed)
Lab on 05/03/2021   Component Date Value Ref Range Status   ??? WBC 05/03/2021 4.9  3.6 - 11.2 10*9/L Final   ??? RBC 05/03/2021 3.43 (A) 3.95 - 5.13 10*12/L Final   ??? HGB 05/03/2021 11.9  11.3 - 14.9 g/dL Final   ??? HCT 29/56/2130 33.5 (A) 34.0 - 44.0 % Final   ??? MCV 05/03/2021 97.7 (A) 77.6 - 95.7 fL Final   ??? MCH 05/03/2021 34.6 (A) 25.9 - 32.4 pg Final   ??? MCHC 05/03/2021 35.4  32.0 - 36.0 g/dL Final   ??? RDW 86/57/8469 14.3  12.2 - 15.2 % Final   ??? MPV 05/03/2021 7.8  6.8 - 10.7 fL Final   ??? Platelet 05/03/2021 105 (A) 150 - 450 10*9/L Final   ??? Neutrophils % 05/03/2021 57.8  % Final   ??? Lymphocytes % 05/03/2021 29.7  % Final   ??? Monocytes % 05/03/2021 11.4  % Final   ??? Eosinophils % 05/03/2021 0.9  % Final   ??? Basophils % 05/03/2021 0.2  % Final   ??? Absolute Neutrophils 05/03/2021 2.8  1.8 - 7.8 10*9/L Final   ??? Absolute Lymphocytes 05/03/2021 1.5  1.1 - 3.6 10*9/L Final   ??? Absolute Monocytes 05/03/2021 0.6  0.3 - 0.8 10*9/L Final   ??? Absolute Eosinophils 05/03/2021 0.0  0.0 - 0.5 10*9/L Final   ??? Absolute Basophils 05/03/2021 0.0  0.0 - 0.1 10*9/L Final

## 2021-05-04 NOTE — Unmapped (Signed)
Hi,     Elon Alas contacted the Communication Center requesting to speak with the care team of Brooke [name] to discuss:    Would like to reschedule 05/24/21 infusion from [facility] to [place]. Patient is requesting upcoming infusions be scheduled in [place].    Please contact Elon Alas at [phone].      Thank you,   Yolanda Bonine  [facility]   [phone]

## 2021-05-05 MED ORDER — PREGABALIN 200 MG CAPSULE
ORAL_CAPSULE | Freq: Two times a day (BID) | ORAL | 2 refills | 30 days | Status: CP
Start: 2021-05-05 — End: ?

## 2021-05-05 NOTE — Unmapped (Signed)
05/05/21 Spoke with [name] concerning r/s of 5/30. [name] agreed to keep the appointment in [facility] @ 9:35am.

## 2021-05-05 NOTE — Unmapped (Signed)
Outpatient Oncology Social Work  Follow Up       Reason for Contact:  Pt reports that she was never contacted by [employer] for in-home eval after Burtis Junes, FNP faxed the [facility] referral to [facility] in February 2022.    HealthCare System Navigation:  T/C to [employer] at [phone] to inquire about [facility] referral faxed by provider in Feb 2022.  Representative states the request was missing the front page, and the back page (which was received) was missing the Practitioner's signature, date singed, and NPI number.   Sent Dr. [name] the original [facility] referral for her to review, sign, date, place NPI, and reminded her to fax to [facility] as a 2-sided document.      Follow-Up Plan:  Pt has this SW'ers contact information and will reach out for psychosocial assistance as needed.     Brooke [name], OSW-C  Oncology Outpatient Social Worker  Phone: [phone]

## 2021-05-07 NOTE — Unmapped (Signed)
-----   Message from Brooke Mackie, MD sent at 05/07/2021  3:13 PM EDT -----  Regarding: RE: f/u  Thanks for the heads up Cindy. It's probably the Xeloda +/- Tucatinib. I would have her hold Xeloda. If no improvement will need to r/o C diff. Em can you let her know?  Thanks   ----- Message -----  From: Burnis Kingfisher, FNP  Sent: 05/07/2021   9:32 AM EDT  To: Brooke Mackie, MD, Modena Jansky, RN, #  Subject: f/u                                              Hi Team    I will check in with [name] on Monday.     Irving Burton or Samuel Bouche,     Can you call later today for a check in?     Michaela sharing that its been a rough week.  Monday night is when the diarrhea started-has had periods during the day when it is almost every hour.  She has had a couple incidences where she has had soiled clothing and bedding due to bowel incontinence.  Can feel that she needs to go to the bathroom but cannot get there quickly enough. Bowel incontinence occurred 2 times during the night and also an episode at the dentist this week.  Yesterday, increased the Lomotil up to 2 tablets and started incorporating Imodium-3 tablets as of yesterday.  Prior to that had only been taking Lomotil 1 tab at at bedtime.  No diarrhea this morning.  Episode of nausea x1 yesterday.  Yesterday was the first day that she had a meal.  Positive anorexia.  Her knees feel weak and tender.  Still in bed presently and no pain. Her upper back pain has been worsening this past week and radiating down her back.  Tolerating the Butrans 5 mcg patch that she started on 5/9.    Plan  Diarrhea  -Shared that she needs to increase the Lomotil to up to 4 tablets/day when she has the diarrhea.  If this is not sufficient to add the Imodium.  -Will connect with primary team.  -Encouraged drinking fluids.    Pain  -Continue with Butrans patch 5 mcg dosing weekly.  -Can add morphine 5 mg as needed every 8 hours if it has a pain flare over the weekend.  -Not able to use Tylenol or an NSAID due to fluctuating platelets and liver enzymes.  -Can consider increasing the Butrans patch to 7.5 mcg dosing next week.    Thanks  cindy

## 2021-05-07 NOTE — Unmapped (Signed)
Brooke [name] sharing that its been a rough week.  Monday night is when the diarrhea started-has had periods during the day when it is almost every hour.  She has had a couple incidences where she has had soiled clothing due to bowel incontinence.  Can feel that she needs to go to the bathroom but cannot get there quickly enough. Bowel incontinence occurred 2 times during the night and also an episode at the dentist this week.  Yesterday, increased the Lomotil up to 2 tablets and started incorporating Imodium???3 tablets as of yesterday.  Prior to that had only been taking Lomotil 1 tab at at bedtime.  No diarrhea this morning.  Episode of nausea x1 yesterday.  Yesterday was the first day that she had a meal.  Positive anorexia.  Her knees feel weak and tender.  Still in bed presently and no pain Her upper back pain has been worsening this past week and radiating down her back.  Tolerating the Butrans 5 mcg patch that she started on 5/9.    Plan  Diarrhea  -Shared that she needs to increase the Lomotil to up to 4 tablets/day prn diarrhea and if this is not sufficient to add the Imodium.  -Will connect with primary team.  -Encouraged drinking fluids.    Pain  -Continue with Butrans patch 5 mcg dosing weekly.  -Can add morphine 5 mg as needed every 8 hours if it has a pain flare over the weekend.  -Not able to use Tylenol or an NSAID due to fluctuating platelets and liver enzymes.  -Can consider increasing the Butrans patch to 7.5 mcg dosing next week.

## 2021-05-07 NOTE — Unmapped (Signed)
Left voicemail for patient about Dr. Jaclyn Shaggy recommendation to hold Xeloda [facility]) until Monday and call back with how diarrhea was. EMR    Call to [name], pts sister, and caregiver. She will make sure patient knows to stop the [address], but continue other medication, and call me back Monday. Stressed importance of patient not withholding food/drink to prevent diarrhea, as [name] states patient had states she was going to do. Discussed importance of diarrhea control due to severe hydration. She said she spoke with [name] last ngiht and that the diarrhea was finally under control after taking Lomotil x 4 that day and immodium for breakthrough. After I speak with patient or [name] Monday, we will have new plan in place. To make note, [name] states patient did not start taking meds until Tuesday evening 5/10, and the diarrhea started almost immediately, so she has only taken meds for 3 days. Will update team. EMR

## 2021-05-10 NOTE — Unmapped (Signed)
Today is a better day and was able to get out of bed.  Did have diarrhea this morning x3, but none since this morning and feels better and able to eat.  Continues to have upper back pain.  Shared that when she had the injections done Dr. Glenice Laine her back did feel better for about 2 months.    Plan  -For now continue buprenorphine 5 mcg patch weekly.  -Shared to try the liquid methadone 5 mg as needed to help with pain.  -Can consider going up on buprenorphine to the 7.5 patch.  Will check in later this week.  -Encouraged to make an appointment with Dr.Telhan for another injection.    Burtis Junes, FNP-BC, [facility]  Outpatient Oncology Palliative Care Service  [facility]  [address] [place], [place] [address]  [phone]

## 2021-05-10 NOTE — Unmapped (Signed)
Spoke with patient, she has been holding Xeloda since Friday afternoon. Following time line below after starting meds    Tuesday night 5/10 started Xeloda/Tucatinib    Diarrhea got worse from baseline starting Wednesday am 5/11, patient states 2-3 times over past week she has incontinence of stool during her sleep.     Thursday diarrhea got more under control after starting Lomotil 4 times daily as needed per pall care.     Patient started holding Xeloda only on Friday afternoon 5/13 per Dr. [name] recommendations. She has still had diarrhea this weekend but not using medications to stop. She states she had 3 diarrhea BM this am, but hasn't had any since.     She states she has had severe fatigue all week, spending most her time in bed, and her appetite has been very poor, just did not feel like eating, and she states that she normally eats very well and cooks 3 meals per day for herself.     She feels better today then she did before starting new medications. She is still taking Tucatinib. She hasn't notice much issue with this medication but did feel like it was getting caught in the back of her throat and burning but feels like better today after taking one of the pills at a time.     I told her I would discuss her concerns with Dr. Annette Stable and see what they would recommend, want her to hold medication again today and have plan for tomroorw

## 2021-05-12 NOTE — Unmapped (Signed)
Called the patient to check in and schedule a follow-up appointment but there was no answer. Left voicemail requesting a call back.    Called the patient's sister, [name], to discuss scheduling. She reported that the patient recently dropped and broke her phone, but they have ordered another one for her. She was not sure of [place]'s availability for appointments, but she agreed to respond to my MyChart message (that offered available appointment slots) in the next 1-2 days with a day/time that would work.

## 2021-05-12 NOTE — Unmapped (Signed)
Pharmacist Phone Follow-Up    Cancer Team  Medical Oncology: Dr. [name]  Reason for call: Oral chemotherapy management  Current treatment: Trastuzumab + tucatininb + capecitabine     Assessment/Plan  Breast cancer:  Brooke [name] is a 65 yo woman with HER2+ metastatic breast cancer who started trastuzumab, tucatinib, and capecitabine on 4/18.  Brooke [name] has been having problems with diarrhea so I have called her for assessment.    Brooke [name] started to develop multiple episodes of diarrhea after starting capecitabine and tucatinib, including 2 episodes of incontinence while she slept.  Imodium and Lomotil were not effective.  She was instructed to hold capecitabine and continue tucatinib.  Her diarrhea has lessened with no episodes today.     Plan:  1. Continue  tucatininb 300 mg BID  2. Will discuss with Dr. [name] restarting capecitabine at a very low dose (500 mg BID x 14 days then 7 days off) and then titrate the dose up as tolerated.  3. Continue Lomotil alternating with Imodium as needed   ________________________________________________________________________     Interval History   Brooke [name] is a 65 y.o. female with metastatic breast cancer started IV trastuzumab and oral capecitabine and tucatininb one week ago (04/12/21).  She is able to verbalize the correct dose and schedule of both oral medications.    Brooke [name] reports that she started to develop multiple episodes of diarrhea after starting this regimen.  She was taking Lomotil and Imodium and still having diarrhea.  She had two nights where she was incontinent because she could not make it to the bathroom on time.  She was instructed to hold capecitabine but to continue tucatinib to see if her diarrhea improved.  Yesterday she had 3 episodes of diarrhea in the a.m. but had no other stools despite not taking any Lomotil or Imodium.  She has not had any stools today.    Oral chemotherapy regimen:??  ????????????????????????Tucatinib 300 mg po BID continuous  ????????????????????????Capecitabine 1500 mg BID x 14 days then 7 days off  ????????????????????????Trastuzumab IV q 3weeks  Start date: 04/12/21  Pharmacy: [facility]     Adherence: No missed doses of tucatinib.  Holding capecitabine    Drug interactions: Not assessed    Breast Oncology    Breast Oncology Metrics:         Chemotherapy Dose: Dose documented     Chemotherapy Schedule: Schedule documented     NCI CTCAE: Diarrhea - Grade 1     Interventions: Education provided, Follow-up with healthcare provider         Patient's sister verbalized understanding of the above information.     I spent 15 minutes on the phone with the patient on the date of service. I spent an additional 10 minutes on pre- and post-visit activities.     The patient was physically located in [place] or a state in which I am permitted to provide care. The patient and/or parent/guardian understood that s/he may incur co-pays and cost sharing, and agreed to the telemedicine visit. The visit was reasonable and appropriate under the circumstances given the patient's presentation at the time.    The patient and/or parent/guardian has been advised of the potential risks and limitations of this mode of treatment (including, but not limited to, the absence of in-person examination) and has agreed to be treated using telemedicine. The patient's/patient's family's questions regarding telemedicine have been answered.     If the visit was completed in an ambulatory  setting, the patient and/or parent/guardian has also been advised to contact their provider???s office for worsening conditions, and seek emergency medical treatment and/or call 911 if the patient deems either necessary.        Oncology History Overview Note   Identifying Statement:  Brooke [name] is a 65 y.o. female diagnosed with a right posterior frontal lobe metastasis (breast primary) status post resection 01/15/2019 and 5/5 fractions of SBRT (2500 Gy via CyberKnife) to the resection cavity.    Treatment History:  09/05/17: S/p SRS to 7 lesions  01/15/19: S/p resection, followed by SRS 2500 cGy   03/04/19: KPS 80; MRI with postsurgical changes versus residual disease; RTC in 2 weeks w/ MRI   04/15/19: KPS NA; MRI w/ SD; no study; RTC 6 weeks w/ MRI   06/10/19: KPS 80; MRI w/ SD; RTC 4 weeks  07/15/19: KPS 80; MRI w/ SD; RTC 8 weeks  08/19/19: KPS 80; start nortriptyline for HAs; RTC 4 weeks w/ MRI  09/16/19: KPS 80; con't nortriptyline for HAs; RTC 2 mos w/ MRI  11/11/19: KPS 80; MRI w/ SD though w/ small infarct; proceed w/ CVA workup; start 81mg  ASA; encouraged smoking cessation; con't nortriptyline; RTC to discuss results  11/25/19: KPS 80; discussed CVA workup results; MRA unremarkable; con't smoking cessation efforts; con't nortriptyline for HAs; con't 81mg  ASA, start Lipitor; f/u with PCP for COPD eval; RTC 2 mos w/ MRI     Malignant neoplasm of overlapping sites of right female breast (CMS-HCC)   2017 -  Presenting Symptoms    Large open wound RT breast which began as nipple inversion. Patient states that she ignored for a long time.  Physical exam of the area of concern in the RTbreast demonstrates a large open wound in the lateral right breast. Saw PCP 01/27/16 Rx Keflex.     02/04/2016 Interval Scan(s)    MMG/US: 3.1 (3.0) cm irregular spiculated dense mass in lateral Rt breast w nipple and skin retraction. 2 subcentimeter irregular masses in the RUOQ  (10', 11') (possible satellite lesions). Large Rt axillary lymph node 1.7. Lt breast clear. BIRAD 5.     02/08/2016 Biopsy    US guided Rt. Axillary LN biopsy:  Gr 3, IDC with apocrine features. ER (-), PR (31-40), HER-2 (3+). Noted to have multiple skin lesions. Poor access to breast mass due open 10-15 cm wound risk of pain and bleeding.     02/09/2016 Initial Diagnosis    Malignant neoplasm of overlapping sites of right female breast (RAF-HCC)     02/10/2016 Interval Scan(s)    CT CAP: Large, necrotic right breast mass with wide open tract to the skin, consistent with known right breast malignancy.  Numerous enlarged right axillary, subpectoral, mediastinal, bilateral hilar lymph nodes and innumerable bilateral pulmonary nodules     02/10/2016 Interval Scan(s)    NM Bone scan: No osseous metastatic disease.     04/28/2016 - 03/10/2020 Chemotherapy    OP TRASTUZUMAB (EVERY 21 DAYS)  Trastuzumab 8 mg/kg loading then 6 mg/kg every 21 days     07/13/2016 Genetics    STRATA  ??? ERBB2 copy number alteration  Estimated copy number: 40, confidence interval: 35.7 - 44.0, cellularity: 50%  Associated FDA-approved targeted therapies in breast cancer: trastuzumab, lapatinib, ado-trastuzumab emtansine, pertuzumab  ??? PIK3CA [id number]     01/03/2020 -  Cancer Staged    CT CAP 01/03/2020 which showed a new lingular opacity measuring 1.9 cm in the right lung. There was also slight increase  in the right breast mass from 1.2-> 1.5 cm.  Bone scan shows no osseous metastases. Overall I considered these findings indeterminate and ppted to repeat CT CAP in 1 month     02/03/2020 -  Cancer Staged    CT chest 02/03/20 which showed that the lingular lesion has persisted and increased in size.  No mediastinal adenopathy reported.  She continues to report a nonproductive cough and mild dyspnea.  She is a chronic smoker and currently smokes on average half a pack per day.  My major concern is that this could be a second primary lung cancer as her other sites of diseases stable on current systemic therapy       02/03/2020 Endocrine/Hormone Therapy    Stop Tamoxifen, Add Fasoldex, continue Q3week Hercpetin     03/23/2020 - 02/09/2021 Chemotherapy    OP BREAST ADO-TRASTUZUMAB EMTANSINE  ado-trastuzumab emtansine 3.6 mg/kg IV on day 1, every 21 days     04/12/2021 -  Chemotherapy    Tucatinib + Capecitabine  OP TRASTUZUMAB (EVERY 21 DAYS)  trastuzumab 8 mg/kg IV LOADING, then 6 mg/kg IV MAINT, every 21 days     Brain metastases (CMS-HCC)   08/07/2017 Initial Diagnosis    Brain metastases (CMS-HCC)    Summary of Radiosurgery   Rx:7 brain met: 09/05/2017: 2,000/2,000 cGy  Sec:R Frontal: 09/05/2017: 2,000 cGy  Sec:L Post Fr: 09/05/2017: 2,000 cGy  Sec:L Ant Fro: 09/05/2017: 2,000 cGy  Sec:R Sup Oc: 09/05/2017: 2,000 cGy  Sec:L Occipita: 09/05/2017: 2,000 cGy  Sec:R Inf Occi: 09/05/2017: 2,000 cGy  Sec:L Tempor: 09/05/2017: 2,000 cGy  Rx:Lt Med Oc: 09/13/2018: 2,000/2,000 cGy  Rx:Rt Frontal: : 2,500/2,500 cGy    01/15/2019: Craniotomy and resection of right posterior frontal lobe metastasis. Followed by [facility]    03/04/19: KPS 80; MRI with postsurgical changes versus residual disease; RTC in 2 weeks w/ MRI     01/03/2020 -  Cancer Staged    CT CAP 01/03/2020 which showed a new lingular opacity measuring 1.9 cm in the right lung. There was also slight increase in the right breast mass from 1.2-> 1.5 cm.  Bone scan shows no osseous metastases. Overall I considered these findings indeterminate and ppted to repeat CT CAP in 1 month     02/03/2020 -  Cancer Staged    CT chest 02/03/20 which showed that the lingular lesion has persisted and increased in size.  No mediastinal adenopathy reported.  She continues to report a nonproductive cough and mild dyspnea.  She is a chronic smoker and currently smokes on average half a pack per day.  My major concern is that this could be a second primary lung cancer as her other sites of diseases stable on current systemic therapy       02/03/2020 Endocrine/Hormone Therapy    Stop Tamoxifen, Add Fasoldex, continue Q3week Hercpetin     Metastatic breast cancer (CMS-HCC)   01/25/2019 Initial Diagnosis    Metastatic breast cancer (CMS-HCC)     03/23/2020 - 02/09/2021 Chemotherapy    OP BREAST ADO-TRASTUZUMAB EMTANSINE  ado-trastuzumab emtansine 3.6 mg/kg IV on day 1, every 21 days     04/12/2021 -  Chemotherapy    Tucatinib + Capecitabine  OP TRASTUZUMAB (EVERY 21 DAYS)  trastuzumab 8 mg/kg IV LOADING, then 6 mg/kg IV MAINT, every 21 days          Pertinent Labs:   No visits with results within 1 Day(s) from this visit.   Latest known visit with results is:  Lab on 05/03/2021   Component Date Value Ref Range Status   ??? WBC 05/03/2021 4.9  3.6 - 11.2 10*9/L Final   ??? RBC 05/03/2021 3.43 (A) 3.95 - 5.13 10*12/L Final   ??? HGB 05/03/2021 11.9  11.3 - 14.9 g/dL Final   ??? HCT 16/09/9603 33.5 (A) 34.0 - 44.0 % Final   ??? MCV 05/03/2021 97.7 (A) 77.6 - 95.7 fL Final   ??? MCH 05/03/2021 34.6 (A) 25.9 - 32.4 pg Final   ??? MCHC 05/03/2021 35.4  32.0 - 36.0 g/dL Final   ??? RDW 54/08/8118 14.3  12.2 - 15.2 % Final   ??? MPV 05/03/2021 7.8  6.8 - 10.7 fL Final   ??? Platelet 05/03/2021 105 (A) 150 - 450 10*9/L Final   ??? Neutrophils % 05/03/2021 57.8  % Final   ??? Lymphocytes % 05/03/2021 29.7  % Final   ??? Monocytes % 05/03/2021 11.4  % Final   ??? Eosinophils % 05/03/2021 0.9  % Final   ??? Basophils % 05/03/2021 0.2  % Final   ??? Absolute Neutrophils 05/03/2021 2.8  1.8 - 7.8 10*9/L Final   ??? Absolute Lymphocytes 05/03/2021 1.5  1.1 - 3.6 10*9/L Final   ??? Absolute Monocytes 05/03/2021 0.6  0.3 - 0.8 10*9/L Final   ??? Absolute Eosinophils 05/03/2021 0.0  0.0 - 0.5 10*9/L Final   ??? Absolute Basophils 05/03/2021 0.0  0.0 - 0.1 10*9/L Final       Current medications:     Current Outpatient Medications   Medication Sig Dispense Refill   ??? albuterol HFA 90 mcg/actuation inhaler Inhale 2 puffs every six (6) hours as needed for wheezing.     ??? aluminum-magnesium hydroxide-simethicone 200-200-20 mg/5 mL Susp 80 mL, diphenhydrAMINE 12.5 mg/5 mL Liqd 200 mg, nystatin 100,000 unit/mL Susp 8,000,000 Units, distilled water Liqd 80 mL, lidocaine 2% viscous 2 % Soln 80 mL Take 5 mL by mouth every six (6) hours as needed. Swish, gargle, spit or swallow if throat issues 80 mL 2   ??? buprenorphine 5 mcg/hour PTWK transdermal patch Place 1 patch on the skin every seven (7) days for 28 days. 4 patch 0   ??? capecitabine (XELODA) 500 MG tablet Take 3 tablets (1,500 mg total) by mouth Two (2) times a day . (Patient taking differently: Take 1,500 mg by mouth Two (2) times a day . Has not started taking yet (new prescription).) 84 tablet 3   ??? clobetasoL (TEMOVATE) 0.05 % ointment Apply twice a day to rash on palm until smooth/clear. 30 g 1   ??? clonazePAM (KLONOPIN) 0.5 MG tablet Take 0.5 mg daily as needed for anxiety.  Do not exceed 0.5 mg/day. 30 tablet 0   ??? diclofenac sodium (VOLTAREN) 1 % gel Apply 2 g topically four (4) times a day as needed for arthritis or pain. 150 g 2   ??? diphenoxylate-atropine (LOMOTIL) 2.5-0.025 mg per tablet Take 1 tablet by mouth 4 (four) times a day as needed for diarrhea. 30 tablet 1   ??? dorzolamide (TRUSOPT) 2 % ophthalmic solution Administer 1 drop into the left eye Three (3) times a day. 10 mL 12   ??? esomeprazole (NEXIUM) 40 MG capsule Take 1 capsule (40 mg total) by mouth daily. 90 capsule 3   ??? folic acid (FOLVITE) 1 MG tablet Take 1 tablet (1 mg total) by mouth daily. 100 tablet 3   ??? lasmiditan 50 mg Tab Take 50 mg by mouth once as needed (Once daily in  24hr period PRN migraine) for up to 1 dose. 7 tablet 1   ??? lisinopriL-hydrochlorothiazide (PRINZIDE,ZESTORETIC) 20-25 mg per tablet Take 1 tablet by mouth daily. 90 tablet 3   ??? MAGNESIUM ORAL Take by mouth daily. OTC     ??? naloxone (NARCAN) 4 mg nasal spray One spray in either nostril once for known/suspected opioid overdose. May repeat every 2-3 minutes in alternating nostril til EMS arrives 2 each 0   ??? nicotine (NICODERM CQ) 21 mg/24 hr patch Place 1 patch on the skin daily. 28 patch 2   ??? nicotine polacrilex (NICORETTE) 4 MG gum Apply 1 each (4 mg total) to cheek every hour as needed for smoking cessation. 110 each 2   ??? ondansetron (ZOFRAN-ODT) 8 MG disintegrating tablet Take 1 tablet (8 mg total) by mouth every twelve (12) hours as needed for nausea. (Patient taking differently: Take 8 mg by mouth every twelve (12) hours as needed for nausea. Patient has not started taking yet (will take with chemo)) 30 tablet 1   ??? polyethylene glycol (MIRALAX) 17 gram/dose powder Take 17 g by mouth Three (3) times a day as needed (constipation). 850 g 3   ??? potassium chloride (KLOR-CON) 10 MEQ CR tablet Take 2 tablets (20 mEq total) by mouth daily. (Patient taking differently: Take 10 mEq by mouth Two (2) times a day. ) 60 tablet 2   ??? pregabalin (LYRICA) 200 MG capsule Take 1 capsule (200 mg total) by mouth Two (2) times a day. 60 capsule 2   ??? prochlorperazine (COMPAZINE) 10 MG tablet Take 1 tablet (10 mg total) by mouth every six (6) hours as needed for nausea. 30 tablet 6   ??? saliva stimulant comb. no.3 (BIOTENE MOISTURIZING MOUTH) Spry Take 1 spray by mouth every two (2) hours as needed (dry mouth). 44.3 mL prn   ??? senna (SENOKOT) 8.6 mg tablet Take 3 tablets by mouth daily.     ??? thiamine HCl (VITAMIN B-1 ORAL) Take by mouth daily.      ??? traZODone (DESYREL) 50 MG tablet Take 1.5 tablets (75 mg total) by mouth nightly. 45 tablet 1   ??? tucatinib (TUKYSA) 150 mg tablet Take 2 tablets (300 mg total) by mouth two (2) times a day . (Patient taking differently: Take 300 mg by mouth two (2) times a day . Has not started taking yet (new prescription)) 120 tablet 2   ??? venlafaxine (EFFEXOR-XR) 150 MG 24 hr capsule Take 150 mg capsule in conjunction with 75 mg capsule for a total of 225 mg by mouth daily 30 capsule 2   ??? venlafaxine (EFFEXOR-XR) 75 MG 24 hr capsule Take 75 mg capsule in conjunction with 150 mg capsule by mouth daily for a total of 225 mg by mouth daily 30 capsule 2     No current facility-administered medications for this visit.

## 2021-05-12 NOTE — Unmapped (Signed)
Hi,     Brooke [name] contacted the [employer] regarding the following:    - States she needs assistance home health services.     Please contact [facility] at [phone] .    Thanks in advance,    Drema Balzarine  [facility]   [phone]

## 2021-05-13 DIAGNOSIS — E876 Hypokalemia: Principal | ICD-10-CM

## 2021-05-13 MED ORDER — POTASSIUM CHLORIDE ER 10 MEQ TABLET,EXTENDED RELEASE
ORAL_TABLET | Freq: Every day | ORAL | 0 refills | 30 days
Start: 2021-05-13 — End: ?

## 2021-05-13 NOTE — Unmapped (Signed)
Outpatient Oncology Social Work  Follow Up       Reason for Contact:  Consult with Pt's Palliative and [facility] psychiatric provider    Healthcare System Navigation:  Telephone consult with Burtis Junes, NP w/ Palliative Care and Fanny Skates, MD w/ [facility].  Discussed patient's recent surge of emotional pain, symptom burden, fears of furthering cognitive decline and loss of independence, and difficulties managing her medications.  Reconfirmed that Arline Asp recently re-faxed [facility] referral to [facility] for assistance in the home.  This SW'er suggested that Arline Asp could talk further with Pt about additional support, such as [facility] new Living with Dying support group.  A referral to Home Health for RN Med Management is also an option.  Arline Asp and Alcario Drought thought both of these ideas could be helpful. She is unsure whether [facility] will be open to a support group as she is a private person.  This SW'er offered to meet with patient in person at her next clinic appt to discuss [facility] referral and the Living with Dying support group.  Burtis Junes, NP will reach out to this SW'er when Pt's next in-person visit is scheduled.    Follow-Up Plan:  Pt has this SW'ers contact information and will reach out for psychosocial assistance as needed.     Claris Gladden, OSW-C  Oncology Outpatient Social Worker  Phone: [phone]

## 2021-05-14 NOTE — Unmapped (Signed)
Pharmacist Phone Follow-Up    Cancer Team  Medical Oncology: Dr. [name]  Reason for call: Oral chemotherapy management  Current treatment: Trastuzumab + tucatininb + capecitabine     Assessment/Plan  Breast cancer:  Ms. Kiernan is a 65 yo woman with HER2+ metastatic breast cancer who started trastuzumab, tucatinib, and capecitabine on 4/18.  Ms. Dullea had diarrhea that was not responding to Imodium or Lomotil.  She stopped capecitabine and continued tucatinib.  She has now had 3 consecutive days with no diarrhea.  She is having a normal stool daily.  She also denies N/V and reports she feels stronger and has a good appetite.     Plan:  1. Continue  tucatininb 300 mg BID and continue to hold capecitabine  2. Will discuss with Dr. [name] about restarting capecitabine  3. Continue Lomotil alternating with Imodium as needed   ________________________________________________________________________       Oral chemotherapy regimen:??  ????????????????????????Tucatinib 300 mg po BID continuous  ????????????????????????Capecitabine 1500 mg BID x 14 days then 7 days off (on hold)  ????????????????????????Trastuzumab IV q 3weeks  Start date: 04/12/21  Pharmacy: [facility]     Adherence: No missed doses of tucatinib.  Holding capecitabine    Drug interactions: Not assessed    Breast Oncology    Breast Oncology Metrics:         Chemotherapy Dose: Dose documented     Chemotherapy Schedule: Schedule documented     NCI CTCAE: Diarrhea - None/Grade 0, Nausea/Vomiting - None/Grade 0     Interventions: Follow-up with healthcare provider, Education provided             Patient verbalized understanding of the above information.     I spent 15 minutes on the phone with the patient on the date of service. I spent an additional 10 minutes on pre- and post-visit activities.     The patient was physically located in [place] or a state in which I am permitted to provide care. The patient and/or parent/guardian understood that s/he may incur co-pays and cost sharing, and agreed to the telemedicine visit. The visit was reasonable and appropriate under the circumstances given the patient's presentation at the time.    The patient and/or parent/guardian has been advised of the potential risks and limitations of this mode of treatment (including, but not limited to, the absence of in-person examination) and has agreed to be treated using telemedicine. The patient's/patient's family's questions regarding telemedicine have been answered.     If the visit was completed in an ambulatory setting, the patient and/or parent/guardian has also been advised to contact their provider???s office for worsening conditions, and seek emergency medical treatment and/or call 911 if the patient deems either necessary.        Oncology History Overview Note   Identifying Statement:  Brooke [name] is a 65 y.o. female diagnosed with a right posterior frontal lobe metastasis (breast primary) status post resection 01/15/2019 and 5/5 fractions of SBRT (2500 Gy via CyberKnife) to the resection cavity.    Treatment History:  09/05/17: S/p SRS to 7 lesions  01/15/19: S/p resection, followed by SRS 2500 cGy   03/04/19: KPS 80; MRI with postsurgical changes versus residual disease; RTC in 2 weeks w/ MRI   04/15/19: KPS NA; MRI w/ SD; no study; RTC 6 weeks w/ MRI   06/10/19: KPS 80; MRI w/ SD; RTC 4 weeks  07/15/19: KPS 80; MRI w/ SD; RTC 8 weeks  08/19/19: KPS 80; start  nortriptyline for HAs; RTC 4 weeks w/ MRI  09/16/19: KPS 80; con't nortriptyline for HAs; RTC 2 mos w/ MRI  11/11/19: KPS 80; MRI w/ SD though w/ small infarct; proceed w/ CVA workup; start 81mg  ASA; encouraged smoking cessation; con't nortriptyline; RTC to discuss results  11/25/19: KPS 80; discussed CVA workup results; MRA unremarkable; con't smoking cessation efforts; con't nortriptyline for HAs; con't 81mg  ASA, start Lipitor; f/u with PCP for COPD eval; RTC 2 mos w/ MRI     Malignant neoplasm of overlapping sites of right female breast (CMS-HCC)   2017 -  Presenting Symptoms    Large open wound RT breast which began as nipple inversion. Patient states that she ignored for a long time.  Physical exam of the area of concern in the RTbreast demonstrates a large open wound in the lateral right breast. Saw PCP 01/27/16 Rx Keflex.     02/04/2016 Interval Scan(s)    MMG/US: 3.1 (3.0) cm irregular spiculated dense mass in lateral Rt breast w nipple and skin retraction. 2 subcentimeter irregular masses in the RUOQ  (10', 11') (possible satellite lesions). Large Rt axillary lymph node 1.7. Lt breast clear. BIRAD 5.     02/08/2016 Biopsy    US guided Rt. Axillary LN biopsy:  Gr 3, IDC with apocrine features. ER (-), PR (31-40), HER-2 (3+). Noted to have multiple skin lesions. Poor access to breast mass due open 10-15 cm wound risk of pain and bleeding.     02/09/2016 Initial Diagnosis    Malignant neoplasm of overlapping sites of right female breast (RAF-HCC)     02/10/2016 Interval Scan(s)    CT CAP: Large, necrotic right breast mass with wide open tract to the skin, consistent with known right breast malignancy.  Numerous enlarged right axillary, subpectoral, mediastinal, bilateral hilar lymph nodes and innumerable bilateral pulmonary nodules     02/10/2016 Interval Scan(s)    NM Bone scan: No osseous metastatic disease.     04/28/2016 - 03/10/2020 Chemotherapy    OP TRASTUZUMAB (EVERY 21 DAYS)  Trastuzumab 8 mg/kg loading then 6 mg/kg every 21 days     07/13/2016 Genetics    STRATA  ??? ERBB2 copy number alteration  Estimated copy number: 40, confidence interval: 35.7 - 44.0, cellularity: 50%  Associated FDA-approved targeted therapies in breast cancer: trastuzumab, lapatinib, ado-trastuzumab emtansine, pertuzumab  ??? PIK3CA [id number]     01/03/2020 -  Cancer Staged    CT CAP 01/03/2020 which showed a new lingular opacity measuring 1.9 cm in the right lung. There was also slight increase in the right breast mass from 1.2-> 1.5 cm.  Bone scan shows no osseous metastases. Overall I considered these findings indeterminate and ppted to repeat CT CAP in 1 month     02/03/2020 -  Cancer Staged    CT chest 02/03/20 which showed that the lingular lesion has persisted and increased in size.  No mediastinal adenopathy reported.  She continues to report a nonproductive cough and mild dyspnea.  She is a chronic smoker and currently smokes on average half a pack per day.  My major concern is that this could be a second primary lung cancer as her other sites of diseases stable on current systemic therapy       02/03/2020 Endocrine/Hormone Therapy    Stop Tamoxifen, Add Fasoldex, continue Q3week Hercpetin     03/23/2020 - 02/09/2021 Chemotherapy    OP BREAST ADO-TRASTUZUMAB EMTANSINE  ado-trastuzumab emtansine 3.6 mg/kg IV on day 1, every 21 days  04/12/2021 -  Chemotherapy    Tucatinib + Capecitabine  OP TRASTUZUMAB (EVERY 21 DAYS)  trastuzumab 8 mg/kg IV LOADING, then 6 mg/kg IV MAINT, every 21 days     Brain metastases (CMS-HCC)   08/07/2017 Initial Diagnosis    Brain metastases (CMS-HCC)    Summary of Radiosurgery   Rx:7 brain met: 09/05/2017: 2,000/2,000 cGy  Sec:R Frontal: 09/05/2017: 2,000 cGy  Sec:L Post Fr: 09/05/2017: 2,000 cGy  Sec:L Ant Fro: 09/05/2017: 2,000 cGy  Sec:R Sup Oc: 09/05/2017: 2,000 cGy  Sec:L Occipita: 09/05/2017: 2,000 cGy  Sec:R Inf Occi: 09/05/2017: 2,000 cGy  Sec:L Tempor: 09/05/2017: 2,000 cGy  Rx:Lt Med Oc: 09/13/2018: 2,000/2,000 cGy  Rx:Rt Frontal: : 2,500/2,500 cGy    01/15/2019: Craniotomy and resection of right posterior frontal lobe metastasis. Followed by [facility]    03/04/19: KPS 80; MRI with postsurgical changes versus residual disease; RTC in 2 weeks w/ MRI     01/03/2020 -  Cancer Staged    CT CAP 01/03/2020 which showed a new lingular opacity measuring 1.9 cm in the right lung. There was also slight increase in the right breast mass from 1.2-> 1.5 cm.  Bone scan shows no osseous metastases. Overall I considered these findings indeterminate and ppted to repeat CT CAP in 1 month 02/03/2020 -  Cancer Staged    CT chest 02/03/20 which showed that the lingular lesion has persisted and increased in size.  No mediastinal adenopathy reported.  She continues to report a nonproductive cough and mild dyspnea.  She is a chronic smoker and currently smokes on average half a pack per day.  My major concern is that this could be a second primary lung cancer as her other sites of diseases stable on current systemic therapy       02/03/2020 Endocrine/Hormone Therapy    Stop Tamoxifen, Add Fasoldex, continue Q3week Hercpetin     Metastatic breast cancer (CMS-HCC)   01/25/2019 Initial Diagnosis    Metastatic breast cancer (CMS-HCC)     03/23/2020 - 02/09/2021 Chemotherapy    OP BREAST ADO-TRASTUZUMAB EMTANSINE  ado-trastuzumab emtansine 3.6 mg/kg IV on day 1, every 21 days     04/12/2021 -  Chemotherapy    Tucatinib + Capecitabine  OP TRASTUZUMAB (EVERY 21 DAYS)  trastuzumab 8 mg/kg IV LOADING, then 6 mg/kg IV MAINT, every 21 days          Pertinent Labs:   No visits with results within 1 Day(s) from this visit.   Latest known visit with results is:   Lab on 05/03/2021   Component Date Value Ref Range Status   ??? WBC 05/03/2021 4.9  3.6 - 11.2 10*9/L Final   ??? RBC 05/03/2021 3.43 (A) 3.95 - 5.13 10*12/L Final   ??? HGB 05/03/2021 11.9  11.3 - 14.9 g/dL Final   ??? HCT 16/09/9603 33.5 (A) 34.0 - 44.0 % Final   ??? MCV 05/03/2021 97.7 (A) 77.6 - 95.7 fL Final   ??? MCH 05/03/2021 34.6 (A) 25.9 - 32.4 pg Final   ??? MCHC 05/03/2021 35.4  32.0 - 36.0 g/dL Final   ??? RDW 54/08/8118 14.3  12.2 - 15.2 % Final   ??? MPV 05/03/2021 7.8  6.8 - 10.7 fL Final   ??? Platelet 05/03/2021 105 (A) 150 - 450 10*9/L Final   ??? Neutrophils % 05/03/2021 57.8  % Final   ??? Lymphocytes % 05/03/2021 29.7  % Final   ??? Monocytes % 05/03/2021 11.4  % Final   ??? Eosinophils % 05/03/2021  0.9  % Final   ??? Basophils % 05/03/2021 0.2  % Final   ??? Absolute Neutrophils 05/03/2021 2.8  1.8 - 7.8 10*9/L Final   ??? Absolute Lymphocytes 05/03/2021 1.5  1.1 - 3.6 10*9/L Final   ??? Absolute Monocytes 05/03/2021 0.6  0.3 - 0.8 10*9/L Final   ??? Absolute Eosinophils 05/03/2021 0.0  0.0 - 0.5 10*9/L Final   ??? Absolute Basophils 05/03/2021 0.0  0.0 - 0.1 10*9/L Final       Current medications:     Current Outpatient Medications   Medication Sig Dispense Refill   ??? albuterol HFA 90 mcg/actuation inhaler Inhale 2 puffs every six (6) hours as needed for wheezing.     ??? aluminum-magnesium hydroxide-simethicone 200-200-20 mg/5 mL Susp 80 mL, diphenhydrAMINE 12.5 mg/5 mL Liqd 200 mg, nystatin 100,000 unit/mL Susp 8,000,000 Units, distilled water Liqd 80 mL, lidocaine 2% viscous 2 % Soln 80 mL Take 5 mL by mouth every six (6) hours as needed. Swish, gargle, spit or swallow if throat issues 80 mL 2   ??? buprenorphine 5 mcg/hour PTWK transdermal patch Place 1 patch on the skin every seven (7) days for 28 days. 4 patch 0   ??? capecitabine (XELODA) 500 MG tablet Take 3 tablets (1,500 mg total) by mouth Two (2) times a day . (Patient taking differently: Take 1,500 mg by mouth Two (2) times a day . Has not started taking yet (new prescription).) 84 tablet 3   ??? clobetasoL (TEMOVATE) 0.05 % ointment Apply twice a day to rash on palm until smooth/clear. 30 g 1   ??? clonazePAM (KLONOPIN) 0.5 MG tablet Take 0.5 mg daily as needed for anxiety.  Do not exceed 0.5 mg/day. 30 tablet 0   ??? diclofenac sodium (VOLTAREN) 1 % gel Apply 2 g topically four (4) times a day as needed for arthritis or pain. 150 g 2   ??? diphenoxylate-atropine (LOMOTIL) 2.5-0.025 mg per tablet Take 1 tablet by mouth 4 (four) times a day as needed for diarrhea. 30 tablet 1   ??? dorzolamide (TRUSOPT) 2 % ophthalmic solution Administer 1 drop into the left eye Three (3) times a day. 10 mL 12   ??? esomeprazole (NEXIUM) 40 MG capsule Take 1 capsule (40 mg total) by mouth daily. 90 capsule 3   ??? folic acid (FOLVITE) 1 MG tablet Take 1 tablet (1 mg total) by mouth daily. 100 tablet 3   ??? lasmiditan 50 mg Tab Take 50 mg by mouth once as needed (Once daily in 24hr period PRN migraine) for up to 1 dose. 7 tablet 1   ??? lisinopriL-hydrochlorothiazide (PRINZIDE,ZESTORETIC) 20-25 mg per tablet Take 1 tablet by mouth daily. 90 tablet 3   ??? MAGNESIUM ORAL Take by mouth daily. OTC     ??? naloxone (NARCAN) 4 mg nasal spray One spray in either nostril once for known/suspected opioid overdose. May repeat every 2-3 minutes in alternating nostril til EMS arrives 2 each 0   ??? nicotine (NICODERM CQ) 21 mg/24 hr patch Place 1 patch on the skin daily. 28 patch 2   ??? nicotine polacrilex (NICORETTE) 4 MG gum Apply 1 each (4 mg total) to cheek every hour as needed for smoking cessation. 110 each 2   ??? ondansetron (ZOFRAN-ODT) 8 MG disintegrating tablet Take 1 tablet (8 mg total) by mouth every twelve (12) hours as needed for nausea. (Patient taking differently: Take 8 mg by mouth every twelve (12) hours as needed for nausea. Patient has not started  taking yet (will take with chemo)) 30 tablet 1   ??? polyethylene glycol (MIRALAX) 17 gram/dose powder Take 17 g by mouth Three (3) times a day as needed (constipation). 850 g 3   ??? potassium chloride (KLOR-CON) 10 MEQ CR tablet Take 2 tablets (20 mEq total) by mouth daily. (Patient taking differently: Take 10 mEq by mouth Two (2) times a day. ) 60 tablet 2   ??? pregabalin (LYRICA) 200 MG capsule Take 1 capsule (200 mg total) by mouth Two (2) times a day. 60 capsule 2   ??? prochlorperazine (COMPAZINE) 10 MG tablet Take 1 tablet (10 mg total) by mouth every six (6) hours as needed for nausea. 30 tablet 6   ??? saliva stimulant comb. no.3 (BIOTENE MOISTURIZING MOUTH) Spry Take 1 spray by mouth every two (2) hours as needed (dry mouth). 44.3 mL prn   ??? senna (SENOKOT) 8.6 mg tablet Take 3 tablets by mouth daily.     ??? thiamine HCl (VITAMIN B-1 ORAL) Take by mouth daily.      ??? traZODone (DESYREL) 50 MG tablet Take 1.5 tablets (75 mg total) by mouth nightly. 45 tablet 1   ??? tucatinib (TUKYSA) 150 mg tablet Take 2 tablets (300 mg total) by mouth two (2) times a day . (Patient taking differently: Take 300 mg by mouth two (2) times a day . Has not started taking yet (new prescription)) 120 tablet 2   ??? venlafaxine (EFFEXOR-XR) 150 MG 24 hr capsule Take 150 mg capsule in conjunction with 75 mg capsule for a total of 225 mg by mouth daily 30 capsule 2   ??? venlafaxine (EFFEXOR-XR) 75 MG 24 hr capsule Take 75 mg capsule in conjunction with 150 mg capsule by mouth daily for a total of 225 mg by mouth daily 30 capsule 2     No current facility-administered medications for this visit.

## 2021-05-15 NOTE — Unmapped (Signed)
Multidisciplinary call with Clarita Crane social worker and Dr. Georgetta Haber psychiatrist to help optimize care for [facility] on 05/13/21.  Concerns are declining cognitive functioning and some periods of emotional distress.    Cognitive decline is going to continue and at one point will be unsafe for Kenslee to live independently.    Plan  -For now Cailan remains safe in her home.  -Question of med compliance/safety.  Abriel is receptive to having a home health nurse come to the home and where she lives there is a nurse named Soledad Gerlach at [phone] whom she thinks would be helpful for her.  NP will call out to see if Drinda Butts for follow-up.  [name] sharing that she has had issues with pillboxes in the past because they have not been big enough to hold all of her a.m. and p.m. meds.  Shared that sometimes there are bigger pillboxes or sometimes we have to use 2 pillboxes.  Explained that when the nurse comes out to her home, they can help with a better plan for using the pillboxes.      -[facility] will be going out for custodial type care.  -NP will need to help clarify goals of care.    [name] suggested to see if Nadra would be interested in the living with dying support group.  NP did not get a chance to discuss this but will mention this with the upcoming visit this week.    -Continues to have pain in the upper back along the right shoulder blade.  NP feels that a targeted lidocaine patch would be beneficial because the pain is still localized in such a small region, smaller than the diameter of a cup.  Unfortunately Iliani cannot put the patch there by herself.  Suggested that Cybele connect with her church community to have them help.  [name] lives 12 minutes away, but her work is demanding.  -Continues to use the 5 mg/Butrans patch and may need to increase this but would like to start with the lidocaine or Salonpas patch first.  -Do have a message out to Dr. Glenice Laine from the spine team.  His intervention was helpful with the last injection.  We will connect with an in basket message.    -Jossette is receptive to coming to [facility] more often.  NP shared that I feel her care will be more comprehensive because most of her team is in [place].  [name] agreeing.    Burtis Junes, FNP-BC, [facility]  Outpatient Oncology Palliative Care Service  [facility]  [address], [place], [place] [address]  [phone]

## 2021-05-17 ENCOUNTER — Ambulatory Visit
Admit: 2021-05-17 | Discharge: 2021-05-18 | Payer: MEDICARE | Attending: Student in an Organized Health Care Education/Training Program | Primary: Student in an Organized Health Care Education/Training Program

## 2021-05-17 DIAGNOSIS — H401121 Primary open-angle glaucoma, left eye, mild stage: Principal | ICD-10-CM

## 2021-05-17 DIAGNOSIS — G8929 Other chronic pain: Principal | ICD-10-CM

## 2021-05-17 DIAGNOSIS — M546 Pain in thoracic spine: Principal | ICD-10-CM

## 2021-05-17 MED ORDER — LIDOCAINE 5 % TOPICAL PATCH
MEDICATED_PATCH | TRANSDERMAL | 0 refills | 30 days | Status: CP
Start: 2021-05-17 — End: 2021-06-16

## 2021-05-17 NOTE — Unmapped (Signed)
Outpatient Oncology Social Work  Follow Up       HealthCare System Navigation:  T/C to [facility] [phone]) regarding the PCS referral for a home aide.  [facility] states they received PCS referral and are now ready to schedule an in-home evaluation with [name].   T/C to Pt and her sister, [name]. Left detailed VM msg advising that they can call [facility] directly to schedule initial eval, if they have not already rec'd a call.    Follow-Up Plan:  Pt has this SW'ers contact information and will reach out for psychosocial assistance as needed.     Claris Gladden, OSW-C  Oncology Outpatient Social Worker  Phone: [phone]

## 2021-05-17 NOTE — Unmapped (Signed)
I have personally examined the patient with the resident and agree with the findings, assessment, and plan as written.      Orie Fisherman, MD  [facility] Department of Ophthalmology

## 2021-05-17 NOTE — Unmapped (Signed)
Pt here for glaucoma eval.     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  Interval:   05/17/21- IOP similar after starting dorzolamide OS. Maybe helping with flux. Will trend testing to determine if need for further IOP lowering.      Plan:  -- continue Dorzolamide TID OS    -- RTC 664mo V/T    #Dry eye  -continue ATs as before    #Cataracts  - not visually significant  - monitor    Pt discussed with Dr. Renae Fickle     664mo V/T

## 2021-05-17 NOTE — Unmapped (Signed)
NP spoke to Soledad Gerlach who is the owner of holistic home care which provides Medicaid personal care services.  Drinda Butts shared that she gets referrals from the [place] team for [facility] personal services and she also has a Designer, jewellery as part of her team.    Drinda Butts sharing with me that [facility] will make an appointment to do a phone assessment.  In person assessments are not done now due to COVID.  Drinda Butts can also be there with Odean has the phone assessment visit to optimize the number of hours per day for in home support.  Drinda Butts shared that Sonja may be eligible for 3 and half hours Monday through Friday and then an hour and a half on Saturday and Sunday.    NP spoke to [place] and shared this information and she knows to call Drinda Butts so that she is there for the phone assessment with [facility].    Back pain is okay for now.  The Salonpas patch did help a little bit but not for long period of time.  Will order the lidocaine 5% patches.  [name] was with [name] when I called and was part of our conversation today.    Burtis Junes, FNP-BC, [facility]  Outpatient Oncology Palliative Care Service  [facility]  [address] [place], [place] [address]  [phone]

## 2021-05-18 NOTE — Unmapped (Addendum)
Prior Authorization submitted for Lidocaine 5% patches Qty 30 for 30 days.    Insurance information: [employer] Medicare Part D 1 [phone]    BIN: [id number]   PCN: [id number]   Grp:  [id number]  ID: [id number]       Unable to submit PA through Cover My Meds; submitted verbally over the phone.  Dx codes: G71.3, M54.6, G60.9    Reference number: [id number]    Time spent: 25 min    APPROVED thru 12/25/21.  $0 Copay    Hector Shade, PharmD, CPP  Outpatient Oncology Palliative Care  May 21, 2021 12:33 PM

## 2021-05-18 NOTE — Unmapped (Signed)
Patient's sister denied refills for Capecitabine, medication is currently on hold due to side effects (diarrhea). Will follow up with cpp to see when patient may start back on medication. Will call patient's sister next week to schedule delivery for [facility].

## 2021-05-19 DIAGNOSIS — E876 Hypokalemia: Principal | ICD-10-CM

## 2021-05-19 DIAGNOSIS — F5101 Primary insomnia: Principal | ICD-10-CM

## 2021-05-19 MED ORDER — TRAZODONE 50 MG TABLET
ORAL_TABLET | Freq: Every evening | ORAL | 1 refills | 30 days
Start: 2021-05-19 — End: ?

## 2021-05-19 MED ORDER — LISINOPRIL 20 MG-HYDROCHLOROTHIAZIDE 25 MG TABLET
ORAL_TABLET | Freq: Every day | ORAL | 0 refills | 90 days | Status: CP
Start: 2021-05-19 — End: 2022-05-19

## 2021-05-19 MED ORDER — POTASSIUM CHLORIDE ER 10 MEQ TABLET,EXTENDED RELEASE
ORAL_TABLET | Freq: Every day | ORAL | 2 refills | 30.00000 days
Start: 2021-05-19 — End: ?

## 2021-05-20 DIAGNOSIS — C50811 Malignant neoplasm of overlapping sites of right female breast: Principal | ICD-10-CM

## 2021-05-20 DIAGNOSIS — G8929 Other chronic pain: Principal | ICD-10-CM

## 2021-05-20 DIAGNOSIS — M549 Dorsalgia, unspecified: Principal | ICD-10-CM

## 2021-05-20 MED ORDER — TUCATINIB 50 MG TABLET
ORAL_TABLET | 3 refills | 0 days | Status: CP
Start: 2021-05-20 — End: ?

## 2021-05-20 MED ORDER — BUPRENORPHINE 7.5 MCG/HOUR WEEKLY TRANSDERMAL PATCH
MEDICATED_PATCH | TRANSDERMAL | 0 refills | 28.00000 days | Status: CP
Start: 2021-05-20 — End: 2021-06-17

## 2021-05-20 MED ORDER — TUCATINIB 150 MG TABLET
ORAL_TABLET | 3 refills | 0 days | Status: CP
Start: 2021-05-20 — End: ?
  Filled 2021-05-27: qty 120, 30d supply, fill #0

## 2021-05-20 MED ORDER — CAPECITABINE 500 MG TABLET
ORAL_TABLET | Freq: Two times a day (BID) | ORAL | 3 refills | 14 days | Status: CP
Start: 2021-05-20 — End: ?
  Filled 2021-05-27: qty 56, 21d supply, fill #0

## 2021-05-20 NOTE — Unmapped (Signed)
Pharmacist Phone Follow-Up    Cancer Team  Medical Oncology: Dr. [name]  Reason for call: Oral chemotherapy management  Current treatment: Trastuzumab + tucatininb + capecitabine     Assessment/Plan  Breast cancer:  Brooke [name] is a 65 yo woman with HER2+ metastatic breast cancer who started trastuzumab, tucatinib, and capecitabine on 4/18.  Brooke [name] had diarrhea that was not responding to Imodium or Lomotil.  She stopped capecitabine and continued tucatinib.  Diarrhea resolved after holding capecitabine.  Per conversation with Dr. [name], we will re-challenge Brooke [name] with capecitabine but will reduce the dose.  We will also dose reduce tucatinib to try to reduce diarrhea.  I was unable to reach Brooke [name] but I spoke to her sister, Brooke [name].    Plan starting with next infusion of trastuzumab on 5/30:  1. Start dose reduced capecitabine 1000 mg BID x 14 days on and 7 days off  2. Start dose reduced tucatinib 250 mg BID continuous.  Since she does not have the 50 mg tablets yet, she will start with tucatinib 150 mg BID (she has these tablets at home) and then start two 50 mg tablets with one 150 mg tablet BID, when the 50 mg tablets are delivered.  3. Continue Lomotil and Imodium as needed for diarrhea  4. CPP telephone follow-up 05/25/21  ________________________________________________________________________     Oral chemotherapy regimen:??   Starting 5/30 doses reduced to:   Tucatinib 250 mg po BID continuous (pt will start with 150 mg BID until she gets 50 mg tabs)  ????????????????????????Capecitabine 1000 mg BID x 14 days then 7 days off  ????????????????????????Trastuzumab IV q 3weeks     Started 04/12/21:  ????????????????????????Tucatinib 300 mg po BID continuous  ????????????????????????Capecitabine 1500 mg BID x 14 days then 7 days off (stopped during first cycle d/t diarrhea)  ????????????????????????Trastuzumab IV q 3weeks  Start date: 04/12/21  Pharmacy: [facility]     Patient's sister verbalized understanding of the above information.     I spent 15 minutes on the phone with the patient on the date of service. I spent an additional 10 minutes on pre- and post-visit activities.     The patient was physically located in [place] or a state in which I am permitted to provide care. The patient and/or parent/guardian understood that s/he may incur co-pays and cost sharing, and agreed to the telemedicine visit. The visit was reasonable and appropriate under the circumstances given the patient's presentation at the time.    The patient and/or parent/guardian has been advised of the potential risks and limitations of this mode of treatment (including, but not limited to, the absence of in-person examination) and has agreed to be treated using telemedicine. The patient's/patient's family's questions regarding telemedicine have been answered.     If the visit was completed in an ambulatory setting, the patient and/or parent/guardian has also been advised to contact their provider???s office for worsening conditions, and seek emergency medical treatment and/or call 911 if the patient deems either necessary.

## 2021-05-20 NOTE — Unmapped (Signed)
Brooke [name] called to report continued pain in her upper back and is not able to see the spine team for the injections until July.  Morphine has not been effective for pain relief and cannot take Tylenol or ibuprofen due to contraindications.    Plan  -Increase Butrans patch from 5 mcg to 7.5 mcg weekly.  Prescription sent today to pharmacy.  -Still not able to get the lidocaine patch filled.  Will connect with Brooke [name] CPP for follow-up.      Burtis Junes, FNP-BC, [facility]  Outpatient Oncology Palliative Care Service  [facility]  [address], [place], [place] [address]  [phone]

## 2021-05-21 DIAGNOSIS — E876 Hypokalemia: Principal | ICD-10-CM

## 2021-05-21 DIAGNOSIS — F5101 Primary insomnia: Principal | ICD-10-CM

## 2021-05-21 MED ORDER — TRAZODONE 50 MG TABLET
ORAL_TABLET | Freq: Every evening | ORAL | 0 refills | 30 days | Status: CP
Start: 2021-05-21 — End: ?

## 2021-05-21 MED ORDER — POTASSIUM CHLORIDE ER 10 MEQ TABLET,EXTENDED RELEASE
ORAL_TABLET | Freq: Two times a day (BID) | ORAL | 0 refills | 90.00000 days | Status: CP
Start: 2021-05-21 — End: 2021-08-19

## 2021-05-21 NOTE — Unmapped (Signed)
Hi,    Patient Brooke [name] called requesting a medication refill for the following:    ??? Medication: Potassium Chloride  ??? Dosage: 10 mEq  ??? Days left of medication: 0  ??? Pharmacy: [place], [place]    The expected turnaround time is 3-4 business days     Thank you,  Yolanda Bonine  [facility]  [phone]

## 2021-05-21 NOTE — Unmapped (Signed)
Clinical Assessment Needed For: Dose Change  Medication: Capecitabine 500 mg tablets  Last Fill Date/Day Supply: 04/27/21 / 21  Copay $0  Was previous dose already scheduled to fill: No    Notes to Pharmacist: n/a    Clinical Assessment Needed For: Dose Change  Medication: Tukysa 150 mg tablets  Last Fill Date/Day Supply: 05/04/21 / 30  Refill Too Soon until 06/02  Was previous dose already scheduled to fill: No    Notes to Pharmacist: n/a      Clinical Assessment Needed For: Dose Change  Medication: Tukysa 50 mg tablets  Last Fill Date/Day Supply: 05/04/21 / 30 for Tukysa 150 mg tablets  Copay $0  Was previous dose already scheduled to fill: No    Notes to Pharmacist: n/a

## 2021-05-21 NOTE — Unmapped (Signed)
[facility] Refill Coordination Note    Specialty Medication(s) to be Shipped:   Hematology/Oncology: Brooke [name] 50 & 150mg  and Capecitabine 500mg , directions:  2 tablets (1,000 mg total) by mouth Two (2) times a day . For 14 days on and 7 days off    Other medication(s) to be shipped: No additional medications requested for fill at this time     Brooke [name], DOB: [birth year]  Phone: [phone] (home)       All above HIPAA information was verified with patient's family member, sister, Brooke [name].     Was a Nurse, learning disability used for this call? No    Completed refill call assessment today to schedule patient's medication shipment from the [facility] [phone]).  All relevant notes have been reviewed.     Specialty medication(s) and dose(s) confirmed: Patient reports changes to the regimen as follows: Tukysa 250 mg twice daily and Capecitabine 500 mg twice daily for 14 on and 7 off   Changes to medications: Brooke [name] reports no changes at this time.  Changes to insurance: No  New side effects reported not previously addressed with a pharmacist or physician: Yes - Patient reports diarrhea. Patient would not like to speak to the pharmacist today. Their provider is aware.  Questions for the pharmacist: No    Confirmed patient received a Conservation officer, historic buildings and a Surveyor, mining with first shipment. The patient will receive a drug information handout for each medication shipped and additional FDA Medication Guides as required.       DISEASE/MEDICATION-SPECIFIC INFORMATION        N/A    SPECIALTY MEDICATION ADHERENCE     Medication Adherence    Patient reported X missed doses in the last month: 0  Specialty Medication: Tukysa 150 mg  Patient is on additional specialty medications: Yes  Additional Specialty Medications: Capecitabine 500 mg  Patient Reported Additional Medication X Missed Doses in the Last Month: 0  Informant: other relative  Confirmed plan for next specialty medication refill: delivery by pharmacy  Refills needed for supportive medications: not needed              Were doses missed due to medication being on hold? No    Capecitabine 500 mg: 14 days of medicine on hand   Tukysa 150 mg: 7 days of medicine on hand   Tukysa 50 mg: 0 days of medicine on hand       REFERRAL TO PHARMACIST     Referral to the pharmacist: Not needed      [facility]     Shipping address confirmed in Epic.     Delivery Scheduled: Yes, Expected medication delivery date: 05/27/21.     Medication will be delivered via Same Day Courier to the prescription address in Epic WAM.    Brooke [name] Brooke [name]   [facility] Specialty Pharmacist

## 2021-05-24 ENCOUNTER — Ambulatory Visit: Admit: 2021-05-24 | Discharge: 2021-05-25 | Payer: MEDICARE

## 2021-05-24 MED ADMIN — trastuzumab (HERCEPTIN) 380 mg in sodium chloride (NS) 0.9 % 250 mL IVPB: 6 mg/kg | INTRAVENOUS | @ 18:00:00 | Stop: 2021-05-24

## 2021-05-24 MED ADMIN — heparin, porcine (PF) 100 unit/mL injection 500 Units: 500 [IU] | INTRAVENOUS | @ 18:00:00 | Stop: 2021-05-25

## 2021-05-24 MED ADMIN — ondansetron (ZOFRAN) injection 8 mg: 8 mg | INTRAVENOUS | @ 18:00:00 | Stop: 2021-05-24

## 2021-05-24 MED ADMIN — sodium chloride (NS) 0.9 % infusion: 100 mL/h | INTRAVENOUS | @ 18:00:00

## 2021-05-24 NOTE — Unmapped (Signed)
Encounter addended by: Olivia Canter, RN on: 05/24/2021 12:28 PM   Actions taken: Diagnosis association updated, Order list changed, Flowsheet accepted

## 2021-05-24 NOTE — Unmapped (Addendum)
1240: Patient present for Trastuzumab infusion, no acute concerns reported. Port accessed with no complications. Patient comfortable with family present and no requests at this time.     PRN Zofran administered per order. Treatment complete with no reaction noted. Line care performed per protocol and port de-accessed. AVS provided. Patient discharged home alert, oriented and in stable condition with family present.

## 2021-05-24 NOTE — Unmapped (Signed)
Encounter addended by: Rudi Coco, AGNP on: 05/24/2021 1:14 PM   Actions taken: Order list changed, Diagnosis association updated

## 2021-05-24 NOTE — Unmapped (Signed)
Encounter addended by: Baker Pierini, RN on: 05/24/2021 2:16 PM   Actions taken: Diagnosis association updated, Order list changed, Clinical Note Signed, MAR administration accepted

## 2021-05-24 NOTE — Unmapped (Signed)
Encounter addended by: Baker Pierini, RN on: 05/24/2021 2:27 PM   Actions taken: Diagnosis association updated, Order list changed, MAR administration accepted, Clinical Note Signed, Flowsheet data copied forward, Flowsheet accepted, Charge Capture section accepted

## 2021-05-25 ENCOUNTER — Institutional Professional Consult (permissible substitution): Admit: 2021-05-25 | Discharge: 2021-05-26 | Payer: MEDICARE | Attending: Pharmacist | Primary: Pharmacist

## 2021-05-25 NOTE — Unmapped (Signed)
Pharmacist Phone Follow-Up    Cancer Team  Medical Oncology: Dr. [name]  Reason for call: Oral chemotherapy management  Current treatment: Trastuzumab + tucatininb + capecitabine     Assessment/Plan  Breast cancer:  Brooke [name] is a 65 yo woman with HER2+ metastatic breast cancer who started trastuzumab, tucatinib, and capecitabine on 4/18.  Brooke [name] had diarrhea that was not responding to Imodium or Lomotil.  She stopped capecitabine and continued tucatinib.  Diarrhea resolved after holding capecitabine.      She has restated dose reduced capecitabine and dose reduced tucatinib yesterday.  Today is day 2 of her cycle.  I spoke to her sister Brooke [name].  Brooke [name] reports that her sister has a medication calendar and is able to verbalize what medications to take and when to take them.  She did not have any adverse effects last night including no diarrhea.    Plan:  1. Continue capecitabine 1000 mg BID x 14 days on and 7 days off  2. Continue ucatinib 150  mg BID.  Starting 6/5 she will have received the 50 mg tablets from the pharmacy and she will increase her dose to 250 mg BID.  3. Continue Lomotil and Imodium as needed for diarrhea  4. 06/14/21: Trastuzumab infusion and labs  5. 06/15/21: CPP appointment  6 07/06/21:  Trastuzumab infusion, labs, and provider appointment  ________________________________________________________________________     Interval History   Brooke [name] is a 65 y.o. female with metastatic breast cancer on trastuzumab, tucatinib and capecitabine.  Today is day 2 of a new cycle with her restarting dose reduced capecitabine and tucatinib.  I spoke to her sister, Brooke [name], who confirmed that Brooke [name] had a calendar and understood the doses and times to take the two medications.  Her first day of the regimen went well with no side effects including diarrhea.  Brooke [name] has Imodium and Lomotil if needed.    Oral chemotherapy regimen:??   Starting 5/30 doses reduced to:   Tucatinib 150 mg po BID (5/30-6/4) then increase on 6/5 to 250 mg after receiving 50 mg tabs from the pharmacy  ????????????????????????Capecitabine 1000 mg BID x 14 days then 7 days off  ????????????????????????Trastuzumab IV q 3weeks     Started 04/12/21:  ????????????????????????Tucatinib 300 mg po BID continuous  ????????????????????????Capecitabine 1500 mg BID x 14 days then 7 days off (stopped during first cycle d/t diarrhea)  ????????????????????????Trastuzumab IV q 3weeks  Start date: 04/12/21  Pharmacy: [facility]     Adherence: Reports taking new doses yesterday    Adverse Effects: None    Drug interactions: Tucatinib is a strong CYP3A4 inhibitor.  It can potentially increase the concentrations of the following medications, requiring a dose reduction.  Will monitor.  1. Bupenorphine  2. Clonazepam  3. Trazodone    Patient's sister verbalized understanding of the above information.     I spent 15 minutes on the phone with the patient on the date of service. I spent an additional 15 minutes on pre- and post-visit activities.     The patient was physically located in [place] or a state in which I am permitted to provide care. The patient and/or parent/guardian understood that s/he may incur co-pays and cost sharing, and agreed to the telemedicine visit. The visit was reasonable and appropriate under the circumstances given the patient's presentation at the time.    The patient and/or parent/guardian has been advised of the potential risks and limitations of this mode of treatment (including,  but not limited to, the absence of in-person examination) and has agreed to be treated using telemedicine. The patient's/patient's family's questions regarding telemedicine have been answered.     If the visit was completed in an ambulatory setting, the patient and/or parent/guardian has also been advised to contact their provider???s office for worsening conditions, and seek emergency medical treatment and/or call 911 if the patient deems either necessary.       Oncology History Overview Note   Identifying Statement:  Brooke [name] is a 65 y.o. female diagnosed with a right posterior frontal lobe metastasis (breast primary) status post resection 01/15/2019 and 5/5 fractions of SBRT (2500 Gy via CyberKnife) to the resection cavity.    Treatment History:  09/05/17: S/p SRS to 7 lesions  01/15/19: S/p resection, followed by SRS 2500 cGy   03/04/19: KPS 80; MRI with postsurgical changes versus residual disease; RTC in 2 weeks w/ MRI   04/15/19: KPS NA; MRI w/ SD; no study; RTC 6 weeks w/ MRI   06/10/19: KPS 80; MRI w/ SD; RTC 4 weeks  07/15/19: KPS 80; MRI w/ SD; RTC 8 weeks  08/19/19: KPS 80; start nortriptyline for HAs; RTC 4 weeks w/ MRI  09/16/19: KPS 80; con't nortriptyline for HAs; RTC 2 mos w/ MRI  11/11/19: KPS 80; MRI w/ SD though w/ small infarct; proceed w/ CVA workup; start 81mg  ASA; encouraged smoking cessation; con't nortriptyline; RTC to discuss results  11/25/19: KPS 80; discussed CVA workup results; MRA unremarkable; con't smoking cessation efforts; con't nortriptyline for HAs; con't 81mg  ASA, start Lipitor; f/u with PCP for COPD eval; RTC 2 mos w/ MRI     Malignant neoplasm of overlapping sites of right female breast (CMS-HCC)   2017 -  Presenting Symptoms    Large open wound RT breast which began as nipple inversion. Patient states that she ignored for a long time.  Physical exam of the area of concern in the RTbreast demonstrates a large open wound in the lateral right breast. Saw PCP 01/27/16 Rx Keflex.     02/04/2016 Interval Scan(s)    MMG/US: 3.1 (3.0) cm irregular spiculated dense mass in lateral Rt breast w nipple and skin retraction. 2 subcentimeter irregular masses in the RUOQ  (10', 11') (possible satellite lesions). Large Rt axillary lymph node 1.7. Lt breast clear. BIRAD 5.     02/08/2016 Biopsy    US guided Rt. Axillary LN biopsy:  Gr 3, IDC with apocrine features. ER (-), PR (31-40), HER-2 (3+). Noted to have multiple skin lesions. Poor access to breast mass due open 10-15 cm wound risk of pain and bleeding.     02/09/2016 Initial Diagnosis    Malignant neoplasm of overlapping sites of right female breast (RAF-HCC)     02/10/2016 Interval Scan(s)    CT CAP: Large, necrotic right breast mass with wide open tract to the skin, consistent with known right breast malignancy.  Numerous enlarged right axillary, subpectoral, mediastinal, bilateral hilar lymph nodes and innumerable bilateral pulmonary nodules     02/10/2016 Interval Scan(s)    NM Bone scan: No osseous metastatic disease.     04/28/2016 - 03/10/2020 Chemotherapy    OP TRASTUZUMAB (EVERY 21 DAYS)  Trastuzumab 8 mg/kg loading then 6 mg/kg every 21 days     07/13/2016 Genetics    STRATA  ??? ERBB2 copy number alteration  Estimated copy number: 40, confidence interval: 35.7 - 44.0, cellularity: 50%  Associated FDA-approved targeted therapies in breast cancer: trastuzumab, lapatinib, ado-trastuzumab emtansine, pertuzumab  ???  PIK3CA [id number]     01/03/2020 -  Cancer Staged    CT CAP 01/03/2020 which showed a new lingular opacity measuring 1.9 cm in the right lung. There was also slight increase in the right breast mass from 1.2-> 1.5 cm.  Bone scan shows no osseous metastases. Overall I considered these findings indeterminate and ppted to repeat CT CAP in 1 month     02/03/2020 -  Cancer Staged    CT chest 02/03/20 which showed that the lingular lesion has persisted and increased in size.  No mediastinal adenopathy reported.  She continues to report a nonproductive cough and mild dyspnea.  She is a chronic smoker and currently smokes on average half a pack per day.  My major concern is that this could be a second primary lung cancer as her other sites of diseases stable on current systemic therapy       02/03/2020 Endocrine/Hormone Therapy    Stop Tamoxifen, Add Fasoldex, continue Q3week Hercpetin     03/23/2020 - 02/09/2021 Chemotherapy    OP BREAST ADO-TRASTUZUMAB EMTANSINE  ado-trastuzumab emtansine 3.6 mg/kg IV on day 1, every 21 days     04/12/2021 -  Chemotherapy Tucatinib + Capecitabine  OP TRASTUZUMAB (EVERY 21 DAYS)  trastuzumab 8 mg/kg IV LOADING, then 6 mg/kg IV MAINT, every 21 days     Brain metastases (CMS-HCC)   08/07/2017 Initial Diagnosis    Brain metastases (CMS-HCC)    Summary of Radiosurgery   Rx:7 brain met: 09/05/2017: 2,000/2,000 cGy  Sec:R Frontal: 09/05/2017: 2,000 cGy  Sec:L Post Fr: 09/05/2017: 2,000 cGy  Sec:L Ant Fro: 09/05/2017: 2,000 cGy  Sec:R Sup Oc: 09/05/2017: 2,000 cGy  Sec:L Occipita: 09/05/2017: 2,000 cGy  Sec:R Inf Occi: 09/05/2017: 2,000 cGy  Sec:L Tempor: 09/05/2017: 2,000 cGy  Rx:Lt Med Oc: 09/13/2018: 2,000/2,000 cGy  Rx:Rt Frontal: : 2,500/2,500 cGy    01/15/2019: Craniotomy and resection of right posterior frontal lobe metastasis. Followed by [facility]    03/04/19: KPS 80; MRI with postsurgical changes versus residual disease; RTC in 2 weeks w/ MRI     01/03/2020 -  Cancer Staged    CT CAP 01/03/2020 which showed a new lingular opacity measuring 1.9 cm in the right lung. There was also slight increase in the right breast mass from 1.2-> 1.5 cm.  Bone scan shows no osseous metastases. Overall I considered these findings indeterminate and ppted to repeat CT CAP in 1 month     02/03/2020 -  Cancer Staged    CT chest 02/03/20 which showed that the lingular lesion has persisted and increased in size.  No mediastinal adenopathy reported.  She continues to report a nonproductive cough and mild dyspnea.  She is a chronic smoker and currently smokes on average half a pack per day.  My major concern is that this could be a second primary lung cancer as her other sites of diseases stable on current systemic therapy       02/03/2020 Endocrine/Hormone Therapy    Stop Tamoxifen, Add Fasoldex, continue Q3week Hercpetin     Metastatic breast cancer (CMS-HCC)   01/25/2019 Initial Diagnosis    Metastatic breast cancer (CMS-HCC)     03/23/2020 - 02/09/2021 Chemotherapy    OP BREAST ADO-TRASTUZUMAB EMTANSINE  ado-trastuzumab emtansine 3.6 mg/kg IV on day 1, every 21 days     04/12/2021 - Chemotherapy    Tucatinib + Capecitabine  OP TRASTUZUMAB (EVERY 21 DAYS)  trastuzumab 8 mg/kg IV LOADING, then 6 mg/kg IV MAINT, every 21 days  Pertinent Labs:   No visits with results within 1 Day(s) from this visit.   Latest known visit with results is:   Lab on 05/03/2021   Component Date Value Ref Range Status   ??? WBC 05/03/2021 4.9  3.6 - 11.2 10*9/L Final   ??? RBC 05/03/2021 3.43 (A) 3.95 - 5.13 10*12/L Final   ??? HGB 05/03/2021 11.9  11.3 - 14.9 g/dL Final   ??? HCT 96/03/5408 33.5 (A) 34.0 - 44.0 % Final   ??? MCV 05/03/2021 97.7 (A) 77.6 - 95.7 fL Final   ??? MCH 05/03/2021 34.6 (A) 25.9 - 32.4 pg Final   ??? MCHC 05/03/2021 35.4  32.0 - 36.0 g/dL Final   ??? RDW 81/19/1478 14.3  12.2 - 15.2 % Final   ??? MPV 05/03/2021 7.8  6.8 - 10.7 fL Final   ??? Platelet 05/03/2021 105 (A) 150 - 450 10*9/L Final   ??? Neutrophils % 05/03/2021 57.8  % Final   ??? Lymphocytes % 05/03/2021 29.7  % Final   ??? Monocytes % 05/03/2021 11.4  % Final   ??? Eosinophils % 05/03/2021 0.9  % Final   ??? Basophils % 05/03/2021 0.2  % Final   ??? Absolute Neutrophils 05/03/2021 2.8  1.8 - 7.8 10*9/L Final   ??? Absolute Lymphocytes 05/03/2021 1.5  1.1 - 3.6 10*9/L Final   ??? Absolute Monocytes 05/03/2021 0.6  0.3 - 0.8 10*9/L Final   ??? Absolute Eosinophils 05/03/2021 0.0  0.0 - 0.5 10*9/L Final   ??? Absolute Basophils 05/03/2021 0.0  0.0 - 0.1 10*9/L Final       Current medications:   Current Outpatient Medications   Medication Sig Dispense Refill   ??? albuterol HFA 90 mcg/actuation inhaler Inhale 2 puffs every six (6) hours as needed for wheezing.     ??? aluminum-magnesium hydroxide-simethicone 200-200-20 mg/5 mL Susp 80 mL, diphenhydrAMINE 12.5 mg/5 mL Liqd 200 mg, nystatin 100,000 unit/mL Susp 8,000,000 Units, distilled water Liqd 80 mL, lidocaine 2% viscous 2 % Soln 80 mL Take 5 mL by mouth every six (6) hours as needed. Swish, gargle, spit or swallow if throat issues 80 mL 2   ??? buprenorphine 7.5 mcg/hour PTWK transdermal patch Place on the skin every seven (7) days for 28 days. Dose increase. Fill today 4 patch 0   ??? capecitabine (XELODA) 500 MG tablet Take 2 tablets (1,000 mg total) by mouth Two (2) times a day . For 14 days on and 7 days off 56 tablet 3   ??? clobetasoL (TEMOVATE) 0.05 % ointment Apply twice a day to rash on palm until smooth/clear. 30 g 1   ??? clonazePAM (KLONOPIN) 0.5 MG tablet Take 0.5 mg daily as needed for anxiety.  Do not exceed 0.5 mg/day. 30 tablet 0   ??? diclofenac sodium (VOLTAREN) 1 % gel Apply 2 g topically four (4) times a day as needed for arthritis or pain. 150 g 2   ??? diphenoxylate-atropine (LOMOTIL) 2.5-0.025 mg per tablet Take 1 tablet by mouth 4 (four) times a day as needed for diarrhea. 30 tablet 1   ??? dorzolamide (TRUSOPT) 2 % ophthalmic solution Administer 1 drop into the left eye Three (3) times a day. 10 mL 12   ??? esomeprazole (NEXIUM) 40 MG capsule Take 1 capsule (40 mg total) by mouth daily. 90 capsule 3   ??? folic acid (FOLVITE) 1 MG tablet Take 1 tablet (1 mg total) by mouth daily. 100 tablet 3   ??? lasmiditan 50 mg Tab  Take 50 mg by mouth once as needed (Once daily in 24hr period PRN migraine) for up to 1 dose. 7 tablet 1   ??? lidocaine (LIDODERM) 5 % patch Place 1 patch on the skin in the morning. Apply to affected area for 12 hours only each day (then remove patch). 30 patch 0   ??? lisinopriL-hydrochlorothiazide (PRINZIDE,ZESTORETIC) 20-25 mg per tablet Take 1 tablet by mouth in the morning. 90 tablet 0   ??? MAGNESIUM ORAL Take by mouth daily. OTC     ??? naloxone (NARCAN) 4 mg nasal spray One spray in either nostril once for known/suspected opioid overdose. May repeat every 2-3 minutes in alternating nostril til EMS arrives 2 each 0   ??? nicotine (NICODERM CQ) 21 mg/24 hr patch Place 1 patch on the skin daily. 28 patch 2   ??? nicotine polacrilex (NICORETTE) 4 MG gum Apply 1 each (4 mg total) to cheek every hour as needed for smoking cessation. 110 each 2   ??? ondansetron (ZOFRAN-ODT) 8 MG disintegrating tablet Take 1 tablet (8 mg total) by mouth every twelve (12) hours as needed for nausea. (Patient taking differently: Take 8 mg by mouth every twelve (12) hours as needed for nausea. Patient has not started taking yet (will take with chemo)) 30 tablet 1   ??? polyethylene glycol (MIRALAX) 17 gram/dose powder Take 17 g by mouth Three (3) times a day as needed (constipation). 850 g 3   ??? potassium chloride (KLOR-CON) 10 MEQ CR tablet Take 1 tablet (10 mEq total) by mouth Two (2) times a day. 180 tablet 0   ??? pregabalin (LYRICA) 200 MG capsule Take 1 capsule (200 mg total) by mouth Two (2) times a day. 60 capsule 2   ??? prochlorperazine (COMPAZINE) 10 MG tablet Take 1 tablet (10 mg total) by mouth every six (6) hours as needed for nausea. 30 tablet 6   ??? saliva stimulant comb. no.3 (BIOTENE MOISTURIZING MOUTH) Spry Take 1 spray by mouth every two (2) hours as needed (dry mouth). 44.3 mL prn   ??? senna (SENOKOT) 8.6 mg tablet Take 3 tablets by mouth daily.     ??? thiamine HCl (VITAMIN B-1 ORAL) Take by mouth daily.      ??? traZODone (DESYREL) 50 MG tablet Take 1.5 tablets (75 mg total) by mouth nightly. 45 tablet 0   ??? tucatinib (TUKYSA) 150 mg tablet Take 1 tablet (150 mg total) by mouth two (2) times a day . 60 tablet 1   ??? tucatinib (TUKYSA) 50 mg tablet Take 2 tablets (100 mg total) by mouth two (2) times a day . 120 tablet 3   ??? venlafaxine (EFFEXOR-XR) 150 MG 24 hr capsule Take 150 mg capsule in conjunction with 75 mg capsule for a total of 225 mg by mouth daily 30 capsule 2   ??? venlafaxine (EFFEXOR-XR) 75 MG 24 hr capsule Take 75 mg capsule in conjunction with 150 mg capsule by mouth daily for a total of 225 mg by mouth daily 30 capsule 2     No current facility-administered medications for this visit.

## 2021-05-27 MED FILL — TUKYSA 150 MG TABLET: ORAL | 30 days supply | Qty: 60 | Fill #0

## 2021-05-27 NOTE — Unmapped (Signed)
Addended by: Kerrin Champagne on: 05/26/2021 07:41 PM     Modules accepted: Orders

## 2021-05-28 ENCOUNTER — Ambulatory Visit: Admit: 2021-05-28 | Discharge: 2021-05-29 | Payer: MEDICARE

## 2021-05-28 MED ORDER — EMPAGLIFLOZIN 10 MG TABLET
ORAL_TABLET | Freq: Every day | ORAL | 11 refills | 30 days | Status: CP
Start: 2021-05-28 — End: ?

## 2021-05-28 MED ORDER — DAPAGLIFLOZIN 5 MG TABLET
ORAL_TABLET | Freq: Every morning | ORAL | 11 refills | 30.00000 days | Status: CP
Start: 2021-05-28 — End: 2021-05-28

## 2021-05-28 NOTE — Unmapped (Addendum)
-  Begin taking Jiardiance 10mg  daily  -No other changes to your medications today

## 2021-05-28 NOTE — Unmapped (Signed)
Outpatient Oncology Social Work  Follow Up       Psychosocial Follow-Up - Home Aide [facility]) Referral Update:  Pt reports that she received a denial letter this week from [name] regarding the [facility] Medicaid referral for a Home Aide.  She reports that she can't read the letter, however, due to cognitive difficulties and low [name] literacy, and doesn't understand the reason for the denial.    Conference T/C with Brooke [name] on the line to [employer] at [phone], and spoke with a representative, who states that the assessment says she wanted assistance with medication reminders and meal prep. However, in order to qualify for a home aide, she will need to show that she requires assistance with at least 2 core ADL's areas (dressing, bathing, toileting, eating and mobility). Meal prep falls under eating, which is only one ADL.     Brooke [name] reports that she told the [name] that she needs help with showering/bathing (someone to stand close b/c she is afraid of falling???she recently fell a month ago when walking outside???she also fell another time in the past 3 months while walking down steps). Brooke [name] just had a handrail placed in her shower and a shower chair (but she doesn't use it b/c she says she is afraid of falling over the chair).     [employer] Rep proposed that [employer] may not have understood (based on what Brooke [name] told her at that time) that Brooke [name] had sufficient mobility/bathing ADL needs for a home aide at this time. Rep suggests that medical provider resubmit an updated [facility] referral, and Rep recommends [place] to explain in subsequent re-evaluation, in more detail, that she has fallen multiple times due to vision loss with Glaucoma and balance issues, and that she cannot shower by herself.    [employer] Rep also recommends that [place] request for this SW???er to be on the call when she is re-evaluated, in order to better explain her needs. Brooke [name] thought this was a great idea and I would be happy to do this.    This SW'er updated [facility] referral, adding Glaucoma as a condition impacting her ADL???s, and adding this SW'ers name and number as an alternate contact in order for Brooke [name] to ask them to call and include me in her re-evaluation.   Sent updated [facility] referral to Brooke Junes, NP Palliative Care for her to sign/date, and fax both front and back pages to Brooke [name].    Follow-Up Plan:  Pt has this SW'ers contact information and will reach out for psychosocial assistance as needed.     Brooke [name], OSW-C  Oncology Outpatient Social Worker  Phone: [phone]

## 2021-05-31 ENCOUNTER — Ambulatory Visit: Admit: 2021-05-31 | Payer: MEDICARE

## 2021-05-31 NOTE — Unmapped (Unsigned)
OUTPATIENT ONCOLOGY PALLIATIVE CARE    Principal Diagnosis: [name] is a 65 y.o. female with metastatic breast cancer,  diagnosed in 2017.  Disease sites include lung and brain.     Assessment/Plan:   1.  Painful neuropathic hands and feet-stable and worsening mid back pain.  Status post fall last week with pain to left knee and lower back-overall improving.  Hx of cervical thoracic paraspinal trigger point bilateral this past Feb.     -Continue lyrica 200 mg bid dosing.  -Stop MS Contin.  -Start buprenorphine 5 mcg patch weekly  -Continue Effexor 225 mg every day  -Continue diclofenac gel as needed  -Not able to tolerate NSAIDs (due to platelets) and tylenol (recent elevated liver enzymes)    Opioid use in past:  -Had difficulty tolerating the methadone 2.5 mg dosing due to sedation.  Did not try the 1 mg methadone, so could consider this in the future if needed for the neuropathic discomfort.  Some reluctance in re-starting methadone.  -No relief with tramadol  -oxycodone-night terrors.    2.  Support-thinks that she is struggling at times to remember things.  Comments that she feels at some point she is going to need more help to live independently.  Sherrie Mustache, MSW did get [employer] support, but Chestine Spore help has not connected with [name] for in-home support.  Moments of tearfulness due to considering what her future may be in the future.  -Will follow-up to get the [facility] health team out for more support.  -Will connect with Dr. Georgetta Haber for support-sure if the plan is to get additional neuropsychiatric testing.  -Provided emotional support.      3.  Goals of care-not addressed today.    At prior visits: goals which are to decrease pain and to maintain her independence. Lives alone and [name] lives close by.     Wants to ensure that her quality of life remains high and the wish to keep on going.  Shared concern that she does not want to be in pain.  NP provided reassurance that our team can offer her different treatment strategies to help her with her goal.     Advance care planning-see advance care planning note dated 01/31/2020.  -at prior visits: Patient shared that she has been dreaming about death. She does have her funeral arrangements completed. Shared that she has her wishes written down and her Sister [name] knows where they are regarding her funeral. She still contemplating whether to be cremated or buried and she is leaning toward cremation. patient currently focused on cancer directed therapy.  Prefers to stay in the moment and not discussed things.  We will continue to support and address if she has a change in clinical condition.        -Advance directives scanned in on 11/02/19        [facility] [place] [facility] - [phone]    [facility], [facility] - Sister - [phone]    At prior visits,   # Controlled substances risk management.  We are not currently prescribing controlled medications for her.   - Patient has a signed pain medication agreement with Outpt Palliative care, completed on 11/01/18, as per standard care. This was signed again on 01/28/19   - [place] database was reviewed today and it was appropriate.   - Urine drug screen was not performed at this visit. Findings: not applicable.   - Patient has received information about safe storage and administration of  medications.   - Patient has received a prescription for narcan and shared with [name] and pt.       F/u: 1 month phone    ----------------------------------------  Referring Provider: From inpatient oncology team  Oncology Team: Breast team-Dr. [name]  PCP: [name], MD      HPI: 65 year old woman with her to overexpressing metastatic breast cancer to her lung and brain.  Was found to have multiple small brain metastases and completed CyberKnife therapy on September 11. Describes ongoing pain in her pelvis, rates it as a 3 out of 10 today.  Has been improving over the last week or 2.  Feels like gabapentin has been helpful, and is also responded well to Tylenol and ibuprofen in the past.  She is taken oxycodone and it gave her night terrors does not want to take it again.  Has taken Dilaudid previously and did not have side effects from this.    Current cancer-directed therapy: capecitabine (Xeloda), tucatinib, and trastuzumab (Herceptin).       Interval hx 03/29/21 [name], [name] and Lisa-Sisters    -CT scans on 01/28/21: Diffuse bone demineralization. Multilevel degenerative changes of the thoracolumbar spine with intervertebral disc space collapse at the L5-S1 level. There is grade 1 anterolisthesis of L5 on S1 with bilateral pars defects. Ill-defined area of low attenuation in the left hepatic lobe, concerning for underlying lesion versus focal fat. Further evaluation with nonemergent MRI is recommended. Hepatic steatosis.  -MRI of abd -area in question on the prior CT and ultrasound likely represents a focal area of hepatic steatosis.  -Bursts of energy. Had been more active and doing more activity in last couple of days.  -Mid Back is hurting again. Pushing sensation. Radiates to front and has epigastric pulling sensation.  -Took 1 norco yesterday. Averaging 2-3 norcos/week.  -Sleeps on a stock of pillows. HT is ok.   -Toes sensation like needles-overall neuropathy is stable.   -Most nights not sleeping well. Is taking trazodone 75 mg hs.   -Wake up coughing intermittent cough-thinks it is pollen.  -BMs every day to every other day. Using senokot prn. Did have diarrhea, but not now  -Mood good, may be day of some sadness, but not prolonged  -Vision ok for now  -Intentional wgt loss. Feels better being lighter.     Interval hx 05/03/21 [facility], [name] and [name]    -While taking out the trash last week had a misstep and fell on her buttocks and hands.  Does have a left knee contusion and did put ice on this afterward.  Her lower back remains a little sore feels that she is walking okay and does not need x-rays.  -Overall feels more anxiety and contributes some of this to her memory not being as good as she like it to be.  Continues to live alone and do her own shopping and taking care of at home.  -Appetite is okay, no nausea.  -At times has diarrhea but relief with Lomotil.  If she has constipation she will take the Senokot.  -Neuropathic pain continues to be a problem in her feet.  Continues to use the Lyrica twice a day.  -Did not like how the morphine made her feel and does not wish to take this in the future.  -Denies alcohol use.  -Sleep is okay and using the trazodone 75 mg at at bedtime.    [facility] Outpatient Oncology Palliative Care  [place] Symptom Assessment System-revised    Please insert  the number for each symptom bellow:  Symptoms Severity 0=Best & 10=Worst    Pain Number: 4    Tiredness Number: 8   Drowsiness Number: 8   Nausea Number: 0   Lack of Appetite Number: 3   Shortness of Breath Number: 4   Depression Number: 8   Anxiety Number: 9   Wellbeing Number: 5   Other Problem: na Number: 0     For the following questions please circle the number between 1 and 7 that best applies to you.     How would you rate your overall quality of life during the past week? (1=Very Poor; 7=Excellent)  Number: 1           Palliative Performance Scale: 80% - Ambulation: Full / Normal Activity with effort, some evidence of disease / Self-Care:Full / Intake: Normal or reduced / Level of Conscious: Full         Coping/Support Issues: Patient reports she is been able to attend church functions and is finding this very helpful. Has boyfriend [name] for the last 15 years.     At prior visits, patient did share that she continues to receive much help from her church friend, [name] and her husband.  They prayed together and she is found this supportive.  She states that her 2 sisters have been supportive, but she feels that they are becoming more less patient with her due to patient's irritability.     Overall coping well, no specific issues identified.  Finding support with her church community, her sister, [name], and prayer.         Social History: Pt lives in senior housing in [place] month, says it's ok and quiet but she is from [place] and liked it there more. She has 4 sisters, 2 nearby and involved in care. She has 1 son-[name], 35yo, who lives about an hour and a half away in [place]. She is divorced.  ??  Goes to [employer] in [place] and gets a lot of support. Her church friend [name] goes with her to chemo appts. Her sisters will go too.    Advance Care Planning: Advance directives scanned into system  HCPOA: See ACP note  Living Will: See ACP note  ACP note: Yes, advance directive scanned in on November 6    Objective     Opioid Risk Tool:      Opioid Risk Tool:   Female  Female    Family history of substance abuse      Alcohol   1  3    Illegal drugs  2  3    Rx drugs  4  4    Personal history of substance abuse      Alcohol  3  3    Illegal drugs  4  4    Rx drugs  5  5    Age between 16--45 years  1  1    History of preadolescent sexual abuse  3 0    Psychological disease      ADD, OCD, bipolar, schizophrenia  2  2    Depression  1  1    Total: 7  (<3 low risk, 4-7 moderate risk, >8 high risk)      Oncology History Overview Note   Identifying Statement:  Katheryn Culliton is a 65 y.o. female diagnosed with a right posterior frontal lobe metastasis (breast primary) status post resection 01/15/2019 and 5/5 fractions of SBRT (2500 Gy via  CyberKnife) to the resection cavity.    Treatment History:  09/05/17: S/p SRS to 7 lesions  01/15/19: S/p resection, followed by SRS 2500 cGy   03/04/19: KPS 80; MRI with postsurgical changes versus residual disease; RTC in 2 weeks w/ MRI   04/15/19: KPS NA; MRI w/ SD; no study; RTC 6 weeks w/ MRI   06/10/19: KPS 80; MRI w/ SD; RTC 4 weeks  07/15/19: KPS 80; MRI w/ SD; RTC 8 weeks  08/19/19: KPS 80; start nortriptyline for HAs; RTC 4 weeks w/ MRI  09/16/19: KPS 80; con't nortriptyline for HAs; RTC 2 mos w/ MRI  11/11/19: KPS 80; MRI w/ SD though w/ small infarct; proceed w/ CVA workup; start 81mg  ASA; encouraged smoking cessation; con't nortriptyline; RTC to discuss results  11/25/19: KPS 80; discussed CVA workup results; MRA unremarkable; con't smoking cessation efforts; con't nortriptyline for HAs; con't 81mg  ASA, start Lipitor; f/u with PCP for COPD eval; RTC 2 mos w/ MRI     Malignant neoplasm of overlapping sites of right female breast (CMS-HCC)   2017 -  Presenting Symptoms    Large open wound RT breast which began as nipple inversion. Patient states that she ignored for a long time.  Physical exam of the area of concern in the RTbreast demonstrates a large open wound in the lateral right breast. Saw PCP 01/27/16 Rx Keflex.     02/04/2016 Interval Scan(s)    MMG/US: 3.1 (3.0) cm irregular spiculated dense mass in lateral Rt breast w nipple and skin retraction. 2 subcentimeter irregular masses in the RUOQ  (10', 11') (possible satellite lesions). Large Rt axillary lymph node 1.7. Lt breast clear. BIRAD 5.     02/08/2016 Biopsy    US guided Rt. Axillary LN biopsy:  Gr 3, IDC with apocrine features. ER (-), PR (31-40), HER-2 (3+). Noted to have multiple skin lesions. Poor access to breast mass due open 10-15 cm wound risk of pain and bleeding.     02/09/2016 Initial Diagnosis    Malignant neoplasm of overlapping sites of right female breast (RAF-HCC)     02/10/2016 Interval Scan(s)    CT CAP: Large, necrotic right breast mass with wide open tract to the skin, consistent with known right breast malignancy.  Numerous enlarged right axillary, subpectoral, mediastinal, bilateral hilar lymph nodes and innumerable bilateral pulmonary nodules     02/10/2016 Interval Scan(s)    NM Bone scan: No osseous metastatic disease.     04/28/2016 - 03/10/2020 Chemotherapy    OP TRASTUZUMAB (EVERY 21 DAYS)  Trastuzumab 8 mg/kg loading then 6 mg/kg every 21 days     07/13/2016 Genetics    STRATA  ??? ERBB2 copy number alteration  Estimated copy number: 40, confidence interval: 35.7 - 44.0, cellularity: 50%  Associated FDA-approved targeted therapies in breast cancer: trastuzumab, lapatinib, ado-trastuzumab emtansine, pertuzumab  ??? PIK3CA [id number]     01/03/2020 -  Cancer Staged    CT CAP 01/03/2020 which showed a new lingular opacity measuring 1.9 cm in the right lung. There was also slight increase in the right breast mass from 1.2-> 1.5 cm.  Bone scan shows no osseous metastases. Overall I considered these findings indeterminate and ppted to repeat CT CAP in 1 month     02/03/2020 -  Cancer Staged    CT chest 02/03/20 which showed that the lingular lesion has persisted and increased in size.  No mediastinal adenopathy reported.  She continues to report a nonproductive cough and mild dyspnea.  She  is a chronic smoker and currently smokes on average half a pack per day.  My major concern is that this could be a second primary lung cancer as her other sites of diseases stable on current systemic therapy       02/03/2020 Endocrine/Hormone Therapy    Stop Tamoxifen, Add Fasoldex, continue Q3week Hercpetin     03/23/2020 - 02/09/2021 Chemotherapy    OP BREAST ADO-TRASTUZUMAB EMTANSINE  ado-trastuzumab emtansine 3.6 mg/kg IV on day 1, every 21 days     04/12/2021 -  Chemotherapy    Tucatinib + Capecitabine  OP TRASTUZUMAB (EVERY 21 DAYS)  trastuzumab 8 mg/kg IV LOADING, then 6 mg/kg IV MAINT, every 21 days     Brain metastases (CMS-HCC)   08/07/2017 Initial Diagnosis    Brain metastases (CMS-HCC)    Summary of Radiosurgery   Rx:7 brain met: 09/05/2017: 2,000/2,000 cGy  Sec:R Frontal: 09/05/2017: 2,000 cGy  Sec:L Post Fr: 09/05/2017: 2,000 cGy  Sec:L Ant Fro: 09/05/2017: 2,000 cGy  Sec:R Sup Oc: 09/05/2017: 2,000 cGy  Sec:L [place]: 09/05/2017: 2,000 cGy  Sec:R Inf Occi: 09/05/2017: 2,000 cGy  Sec:L Tempor: 09/05/2017: 2,000 cGy  Rx:Lt Med Oc: 09/13/2018: 2,000/2,000 cGy  Rx:Rt Frontal: : 2,500/2,500 cGy    01/15/2019: Craniotomy and resection of right posterior frontal lobe metastasis. Followed by [facility]    03/04/19: KPS 80; MRI with postsurgical changes versus residual disease; RTC in 2 weeks w/ MRI     01/03/2020 -  Cancer Staged    CT CAP 01/03/2020 which showed a new lingular opacity measuring 1.9 cm in the right lung. There was also slight increase in the right breast mass from 1.2-> 1.5 cm.  Bone scan shows no osseous metastases. Overall I considered these findings indeterminate and ppted to repeat CT CAP in 1 month     02/03/2020 -  Cancer Staged    CT chest 02/03/20 which showed that the lingular lesion has persisted and increased in size.  No mediastinal adenopathy reported.  She continues to report a nonproductive cough and mild dyspnea.  She is a chronic smoker and currently smokes on average half a pack per day.  My major concern is that this could be a second primary lung cancer as her other sites of diseases stable on current systemic therapy       02/03/2020 Endocrine/Hormone Therapy    Stop Tamoxifen, Add Fasoldex, continue Q3week Hercpetin     Metastatic breast cancer (CMS-HCC)   01/25/2019 Initial Diagnosis    Metastatic breast cancer (CMS-HCC)     03/23/2020 - 02/09/2021 Chemotherapy    OP BREAST ADO-TRASTUZUMAB EMTANSINE  ado-trastuzumab emtansine 3.6 mg/kg IV on day 1, every 21 days     04/12/2021 -  Chemotherapy    Tucatinib + Capecitabine  OP TRASTUZUMAB (EVERY 21 DAYS)  trastuzumab 8 mg/kg IV LOADING, then 6 mg/kg IV MAINT, every 21 days         Patient Active Problem List   Diagnosis   ??? Malignant neoplasm of overlapping sites of right female breast (CMS-HCC)   ??? Peripheral neuropathy   ??? Insomnia   ??? Brain metastases (CMS-HCC)   ??? Nausea   ??? Closed fracture of one rib with routine healing   ??? Diarrhea of presumed infectious origin   ??? Failure to thrive in adult   ??? Tobacco use   ??? Chronic diarrhea   ??? Depression   ??? Hyponatremia   ??? Back pain   ??? Closed fracture of multiple pubic rami, right,  initial encounter (CMS-HCC)   ??? Macrocytosis   ??? Pelvic fracture (CMS-HCC)   ??? Metastatic breast cancer (CMS-HCC)   ??? Migraine without aura and without status migrainosus, not intractable   ??? Essential hypertension   ??? CVA (cerebral vascular accident) (CMS-HCC)   ??? DDD (degenerative disc disease), cervical   ??? Wheezing   ??? Dyslipidemia   ??? Angina pectoris (CMS-HCC)   ??? Papillary fibroelastoma of heart   ??? Antineoplastic chemotherapy induced anemia   ??? Celiac disease   ??? Iron deficiency anemia due to chronic blood loss   ??? Dry eye syndrome, bilateral   ??? Meibomian gland dysfunction (MGD) of both eyes   ??? Glaucoma suspect of both eyes   ??? Incipient cataract of both eyes   ??? 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)       Past Medical History:   Diagnosis Date   ??? Abnormal ECG unsure   ??? Alcoholism (CMS-HCC)    ??? Allergic rhinitis    ??? Anemia    ??? Anxiety     insomnia   ??? Arthritis not sure   ??? Brain concussion    ??? Breast cancer (CMS-HCC)     chemo for now with mets   ??? COPD (chronic obstructive pulmonary disease) (CMS-HCC)    ??? Depression    ??? Dysphagia    ??? Genitourinary disease    ??? GERD (gastroesophageal reflux disease)    ??? Headache    ??? HTN (hypertension)    ??? Hyperlipidemia    ??? Insomnia    ??? 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 Stroke (CMS-HCC) Nov. 2020    Dr. Theodoro Kalata   ??? Tobacco dependence        Past Surgical History:   Procedure Laterality Date   ??? CESAREAN SECTION     ??? CHOLECYSTECTOMY     ??? FINGER SURGERY Right     trigger finger   ??? GANGLION CYST EXCISION Left     hand   ??? HYSTERECTOMY     ??? LYMPH NODE BIOPSY Right 02/08/2016    Axillary   ??? PR BRNSCHSC TNDSC EBUS DX/TX INTERVENTION PERPH LES Left 03/05/2020    Procedure: Bronch, Rigid Or Flexible, Including Fluoro Guidance, When Performed; With Transendoscopic Ebus During Bronchoscopic Diagnostic Or Therapeutic Intervention(S) For Peripheral Lesion(S);  Surgeon: Jerelyn Charles, MD;  Location: MAIN OR [facility];  Service: Pulmonary   ??? PR BRONCHOSCOPY,COMPUTER ASSIST/IMAGE-GUIDED NAVIGATION Left 03/05/2020    Procedure: BRONCHOSCOPY, RIGID OR FLEXIBLE, INCLUDE FLUORO WHEN PERFORMED; W/COMPUTER-ASSIST, IMAGE-GUIDED NAVIGATION;  Surgeon: Jerelyn Charles, MD;  Location: MAIN OR [facility];  Service: Pulmonary   ??? PR BRONCHOSCOPY,DIAGNOSTIC W LAVAGE Left 03/05/2020    Procedure: Bronchoscopy, Rigid Or Flexible, Include Fluoroscopic Guidance When Performed; W/Bronchial Alveolar Lavage;  Surgeon: Jerelyn Charles, MD;  Location: MAIN OR [facility];  Service: Pulmonary   ??? PR BRONCHOSCOPY,TRANSBRON ASPIR BX Left 03/05/2020    Procedure: Bronchoscopy, Rigid/Flex, Incl Fluoro; W/Transbronch Ndl Aspirat Bx, Trachea, Main Stem &/Or Lobar Bronchus;  Surgeon: Jerelyn Charles, MD;  Location: MAIN OR [facility];  Service: Pulmonary   ??? PR BRONCHOSCOPY,TRANSBRONCH BIOPSY Left 03/05/2020    Procedure: Bronchoscopy, Rigid/Flexible, Include Fluoro Guidance When Performed; W/Transbronchial Lung Bx, Single Lobe;  Surgeon: Jerelyn Charles, MD;  Location: MAIN OR [facility];  Service: Pulmonary   ??? PR CATH PLACE/CORON ANGIO, IMG SUPER/INTERP,W LEFT HEART VENTRICULOGRAPHY N/A 05/07/2020    Procedure: Left Heart Catheterization;  Surgeon: Samuel Germany  Harland German, MD;  Location: [facility] CATH;  Service: Cardiology   ??? PR COLONOSCOPY W/BIOPSY SINGLE/MULTIPLE N/A 10/21/2016    Procedure: COLONOSCOPY, FLEXIBLE, PROXIMAL TO SPLENIC FLEXURE; WITH BIOPSY, SINGLE OR MULTIPLE;  Surgeon: Monte Fantasia, MD;  [facility]: GI PROCEDURES [facility];  Service: Gastroenterology   ??? PR EXCIS SUPRATENT BRAIN TUMOR Right 01/15/2019    Procedure: CRANIECTOMY; EXC BRAIN TUMOR-SUPRATENTORIAL;  Surgeon: Edison Simon, MD;  Location: MAIN OR [facility];  Service: Neurosurgery   ??? PR INCISE FINGER TENDON SHEATH Left 06/17/2019    Procedure: R-20 TENDON SHEATH INCISION (EG, FOR TRIGGER FINGER);  Surgeon: Daisy Lazar, MD;  Location: ASC OR [facility];  Service: Orthopedics   ??? PR MICROSURG TECHNIQUES,REQ OPER MICROSCOPE Right 01/15/2019    Procedure: MICROSURGICAL TECHNIQUES, REQUIRING USE OF OPERATING MICROSCOPE (LIST SEPARATELY IN ADDITION TO CODE FOR PRIMARY PROCEDURE);  Surgeon: Edison Simon, MD;  Location: MAIN OR [facility];  Service: Neurosurgery   ??? PR STEREOTACTIC COMP ASSIST PROC,CRANIAL,INTRADURAL Right 01/15/2019    Procedure: STEREOTACTIC COMPUTER-ASSISTED (NAVIGATIONAL) PROCEDURE; CRANIAL, INTRADURAL;  Surgeon: Edison Simon, MD;  Location: MAIN OR [facility];  Service: Neurosurgery   ??? PR UPPER GI ENDOSCOPY,BIOPSY N/A 10/21/2016    Procedure: UGI ENDOSCOPY; WITH BIOPSY, SINGLE OR MULTIPLE;  Surgeon: Monte Fantasia, MD;  [facility]: [facility]ED[facility];  Service: Gastroenterology   ??? PR UPPER GI ENDOSCOPY,BIOPSY N/A 04/02/2018    Procedure: UGI ENDOSCOPY; WITH BIOPSY, SINGLE OR MULTIPLE;  Surgeon: Wendall Papa, MD;  [facility];  Service: Gastroenterology   ??? SKIN BIOPSY         Current Outpatient Medications   Medication Sig Dispense Refill   ??? albuterol HFA 90 mcg/actuation inhaler Inhale 2 puffs every six (6) hours as needed for wheezing.     ??? aluminum-magnesium hydroxide-simethicone 200-200-20 mg/5 mL Susp 80 mL, diphenhydrAMINE 12.5 mg/5 mL Liqd 200 mg, nystatin 100,000 unit/mL Susp 8,000,000 Units, distilled water Liqd 80 mL, lidocaine 2% viscous 2 % Soln 80 mL Take 5 mL by mouth every six (6) hours as needed. Swish, gargle, spit or swallow if throat issues 80 mL 2   ??? buprenorphine 7.5 mcg/hour PTWK transdermal patch Place on the skin every seven (7) days for 28 days. Dose increase. Fill today 4 patch 0   ??? capecitabine (XELODA) 500 MG tablet Take 2 tablets (1,000 mg total) by mouth Two (2) times a day . For 14 days on and 7 days off 56 tablet 3   ??? clobetasoL (TEMOVATE) 0.05 % ointment Apply twice a day to rash on palm until smooth/clear. 30 g 1   ??? clonazePAM (KLONOPIN) 0.5 MG tablet Take 0.5 mg daily as needed for anxiety.  Do not exceed 0.5 mg/day. 30 tablet 0   ??? diclofenac sodium (VOLTAREN) 1 % gel Apply 2 g topically four (4) times a day as needed for arthritis or pain. 150 g 2   ??? diphenoxylate-atropine (LOMOTIL) 2.5-0.025 mg per tablet Take 1 tablet by mouth 4 (four) times a day as needed for diarrhea. 30 tablet 1   ??? dorzolamide (TRUSOPT) 2 % ophthalmic solution Administer 1 drop into the left eye Three (3) times a day. 10 mL 12   ??? empagliflozin (JARDIANCE) 10 mg tablet Take 1 tablet (10 mg total) by mouth in the morning. 30 tablet 11   ??? esomeprazole (NEXIUM) 40 MG capsule Take 1 capsule (40 mg total) by mouth daily. 90 capsule 3   ??? lasmiditan 50 mg Tab Take 50  mg by mouth once as needed (Once daily in 24hr period PRN migraine) for up to 1 dose. 7 tablet 1   ??? lidocaine (LIDODERM) 5 % patch Place 1 patch on the skin in the morning. Apply to affected area for 12 hours only each day (then remove patch). 30 patch 0   ??? lisinopriL-hydrochlorothiazide (PRINZIDE,ZESTORETIC) 20-25 mg per tablet Take 1 tablet by mouth in the morning. 90 tablet 0   ??? MAGNESIUM ORAL Take by mouth daily. OTC     ??? naloxone (NARCAN) 4 mg nasal spray One spray in either nostril once for known/suspected opioid overdose. May repeat every 2-3 minutes in alternating nostril til EMS arrives 2 each 0   ??? nicotine (NICODERM CQ) 21 mg/24 hr patch Place 1 patch on the skin daily. 28 patch 2   ??? nicotine polacrilex (NICORETTE) 4 MG gum Apply 1 each (4 mg total) to cheek every hour as needed for smoking cessation. 110 each 2   ??? ondansetron (ZOFRAN-ODT) 8 MG disintegrating tablet Take 1 tablet (8 mg total) by mouth every twelve (12) hours as needed for nausea. (Patient taking differently: Take 8 mg by mouth every twelve (12) hours as needed for nausea. Patient has not started taking yet (will take with chemo)) 30 tablet 1   ??? polyethylene glycol (MIRALAX) 17 gram/dose powder Take 17 g by mouth Three (3) times a day as needed (constipation). 850 g 3   ??? potassium chloride (KLOR-CON) 10 MEQ CR tablet Take 1 tablet (10 mEq total) by mouth Two (2) times a day. 180 tablet 0   ??? pregabalin (LYRICA) 200 MG capsule Take 1 capsule (200 mg total) by mouth Two (2) times a day. 60 capsule 2   ??? prochlorperazine (COMPAZINE) 10 MG tablet Take 1 tablet (10 mg total) by mouth every six (6) hours as needed for nausea. 30 tablet 6   ??? saliva stimulant comb. no.3 (BIOTENE MOISTURIZING MOUTH) Spry Take 1 spray by mouth every two (2) hours as needed (dry mouth). 44.3 mL prn   ??? senna (SENOKOT) 8.6 mg tablet Take 3 tablets by mouth daily.     ??? thiamine HCl (VITAMIN B-1 ORAL) Take by mouth daily.      ??? traZODone (DESYREL) 50 MG tablet Take 1.5 tablets (75 mg total) by mouth nightly. 45 tablet 0   ??? tucatinib (TUKYSA) 150 mg tablet Take 1 tablet (150 mg total) by mouth two (2) times a day . 60 tablet 1   ??? tucatinib (TUKYSA) 50 mg tablet Take 2 tablets (100 mg total) by mouth two (2) times a day . 120 tablet 3   ??? venlafaxine (EFFEXOR-XR) 150 MG 24 hr capsule Take 150 mg capsule in conjunction with 75 mg capsule for a total of 225 mg by mouth daily 30 capsule 2   ??? venlafaxine (EFFEXOR-XR) 75 MG 24 hr capsule Take 75 mg capsule in conjunction with 150 mg capsule by mouth daily for a total of 225 mg by mouth daily 30 capsule 2     No current facility-administered medications for this visit.       Allergies:   Allergies   Allergen Reactions   ??? Adhesive Rash   ??? Decadron [Dexamethasone] Anxiety     Mania as well   ??? Morphine Itching   ??? Tetracycline      Other reaction(s): Other (See Comments)   ??? Oxycodone      Night terrors   ??? Tegaderm Adhesive-No Drug-Allergy Check Rash  Family History:  Cancer-related family history includes Cancer in her father, maternal grandfather, maternal uncle, maternal uncle, and paternal aunt.  She indicated that the status of her mother is unknown. She indicated that the status of her father is unknown. She indicated that the status of her brother is unknown. She indicated that the status of her maternal grandmother is unknown. She indicated that the status of her maternal grandfather is unknown. She indicated that the status of her paternal grandmother is unknown. She indicated that the status of her paternal grandfather is unknown. She indicated that the status of her maternal aunt is unknown. She indicated that the status of her paternal uncle is unknown. She indicated that the status of her neg hx is unknown. She indicated that the status of her other is unknown.    Vital signs for this encounter: VS reviewed in EPIC.  GEN: Awake and alert & in no acute distress  PSYCH: Alert and oriented to person, place and time. Euthymic.  HEENT: Pupils equally round without scleral icterus. No facial asymmetry.  LUNGS: No increased work of breathing.  SKIN: No rashes, petechiae or jaundice noted on visible skin  EXT: No edema noted of the lower extremities. Contusion on left medial knee.  NEURO: Normal gait and coordination.         Lab Results   Component Value Date    CREATININE 0.62 04/06/2021     Lab Results   Component Value Date    ALKPHOS 505 (H) 04/06/2021    BILITOT 0.6 04/06/2021    BILIDIR 0.30 03/29/2021    PROT 7.6 04/06/2021    ALBUMIN 3.8 04/06/2021    ALT 64 (H) 04/06/2021    AST 100 (H) 04/06/2021             Burtis Junes, FNP-BC, [facility]  Outpatient Oncology Palliative Care Service  [facility]  [address] [place], [place] [address]  [phone]     {    Coding tips - Do not edit this text, it will delete upon signing of note!    ?? Telephone visits [phone] for Physicians and APP??s and [phone] for Non- Physician Clinicians)- Only use minutes on the phone to determine level of service.    ?? Video visits [phone]) - Use both minutes on video and pre/post minutes to determine level of service.       :[id number]}    Time spent with patient was 35 minutes.  Additional 15 minutes were spent on preparation, documenting and coordinating care.

## 2021-05-31 NOTE — Unmapped (Signed)
Hi,     Patient Brooke [name] contacted the [facility] today stating that she is trying to receive a call back where she stated she was contacted prior to her calling in but, she missed the call. Please contact patient back at [phone]    .Thank you,   Noland Fordyce  [facility]  [phone]

## 2021-06-01 ENCOUNTER — Telehealth: Admit: 2021-06-01 | Discharge: 2021-06-02 | Payer: MEDICARE

## 2021-06-01 MED ORDER — BUPRENORPHINE 10 MCG/HOUR WEEKLY TRANSDERMAL PATCH
MEDICATED_PATCH | TRANSDERMAL | 0 refills | 28 days | Status: CP
Start: 2021-06-01 — End: 2021-06-29

## 2021-06-01 NOTE — Unmapped (Signed)
OUTPATIENT ONCOLOGY PALLIATIVE CARE    Principal Diagnosis: Ms. Laffoon is a 65 y.o. female with metastatic breast cancer,  diagnosed in 2017.  Disease sites include lung and brain.     Assessment/Plan:   1.  Painful neuropathic hands and feet and mid upper back pain. Hx of cervical thoracic paraspinal trigger point bilateral this past Feb.     -Continue lyrica 200 mg bid dosing.  -Increase buprenorphine 7.5 mcg patch weekly to 10 mcg patch weekly  -Continue Effexor 225 mg every day  -Continue diclofenac gel as needed  -Not able to tolerate NSAIDs (due to platelets) and tylenol (recent elevated liver enzymes)  -Discussed safely of not using Norco in the future.  -Monitor for continued headaches.  Last MRI was in March of this year.    Opioid use in past:  -Had difficulty tolerating the methadone 2.5 mg dosing due to sedation.  Did not try the 1 mg methadone, so could consider this in the future if needed for the neuropathic discomfort.  Some reluctance in re-starting methadone.  -No relief with tramadol  -oxycodone-night terrors.    2.  Support-family has been able to help as much as they can.  Her sisters continue to work full-time.  Working to get in-home care support and has already had an interview and planning to have 1 more.  -Discussed how I think having access to the [facility] would be beneficial.  Dinisha's family is working on this to make sure she has this access.      3.  Goals of care-not addressed today.    At prior visits: goals which are to decrease pain and to maintain her independence. Lives alone and [name] lives close by.     Wants to ensure that her quality of life remains high and the wish to keep on going.  Shared concern that she does not want to be in pain.  NP provided reassurance that our team can offer her different treatment strategies to help her with her goal.     Advance care planning-see advance care planning note dated 01/31/2020.  -at prior visits: Patient shared that she has been dreaming about death. She does have her funeral arrangements completed. Shared that she has her wishes written down and her Sister [name] knows where they are regarding her funeral. She still contemplating whether to be cremated or buried and she is leaning toward cremation. patient currently focused on cancer directed therapy.  Prefers to stay in the moment and not discussed things.  We will continue to support and address if she has a change in clinical condition.        -Advance directives scanned in on 11/02/19        HCDM [facility] [place] - Sister - [phone]    HCDM, [facility] - Sister - [phone]    At prior visits,   # Controlled substances risk management.  We are not currently prescribing controlled medications for her.   - Patient has a signed pain medication agreement with Outpt Palliative care, completed on 11/01/18, as per standard care. This was signed again on 01/28/19   - [place] database was reviewed today and it was appropriate.   - Urine drug screen was not performed at this visit. Findings: not applicable.   - Patient has received information about safe storage and administration of medications.   - Patient has received a prescription for narcan and shared with [name] and pt.       F/u:  1 month phone    ----------------------------------------  Referring Provider: From inpatient oncology team  Oncology Team: Breast team-Dr. [name]  PCP: [name], MD      HPI: 65 year old woman with her to overexpressing metastatic breast cancer to her lung and brain.  Was found to have multiple small brain metastases and completed CyberKnife therapy on September 11. Describes ongoing pain in her pelvis, rates it as a 3 out of 10 today.  Has been improving over the last week or 2.  Feels like gabapentin has been helpful, and is also responded well to Tylenol and ibuprofen in the past.  She is taken oxycodone and it gave her night terrors does not want to take it again.  Has taken Dilaudid previously and did not have side effects from this.    Current cancer-directed therapy: capecitabine (Xeloda), tucatinib, and trastuzumab (Herceptin).       Interval hx 05/03/21 [facility], [name] and [name]    -While taking out the trash last week had a misstep and fell on her buttocks and hands.  Does have a left knee contusion and did put ice on this afterward.  Her lower back remains a little sore feels that she is walking okay and does not need x-rays.  -Overall feels more anxiety and contributes some of this to her memory not being as good as she like it to be.  Continues to live alone and do her own shopping and taking care of at home.  -Appetite is okay, no nausea.  -At times has diarrhea but relief with Lomotil.  If she has constipation she will take the Senokot.  -Neuropathic pain continues to be a problem in her feet.  Continues to use the Lyrica twice a day.  -Did not like how the morphine made her feel and does not wish to take this in the future.  -Denies alcohol use.  -Sleep is okay and using the trazodone 75 mg at at bedtime.    Interval hx 06/01/21 [facility] and [name]    -Stopped the butran's patch 7.5 mg because she didn't think it was helping.   -Continued upper back pain. Activity worsens pain.. Started taking norco again.  -Little bit of relief with the salon pas  -Appreciating headaches occurring in the afternoon in the last 2 weeks.  Pain is centered in the temporal region and describes it as an aching sensation.  -Feels like she is losing her patience and finds that she is more irritated quicker.  -Having a hard time doing laundry.  -Still trying to cook her meals because she really does not like any other type of fast food.  -Denies alcohol use.    Palliative Performance Scale: 80% - Ambulation: Full / Normal Activity with effort, some evidence of disease / Self-Care:Full / Intake: Normal or reduced / Level of Conscious: Full         Coping/Support Issues: Patient reports she is been able to attend church functions and is finding this very helpful. Has boyfriend [name] for the last 15 years.     At prior visits, patient did share that she continues to receive much help from her church friend, [name] and her husband.  They prayed together and she is found this supportive.  She states that her 2 sisters have been supportive, but she feels that they are becoming more less patient with her due to patient's irritability.     Overall coping well, no specific issues identified.  Finding support with her church community, her  sister, [name], and prayer.         Social History: Pt lives in senior housing in [place] month, says it's ok and quiet but she is from [place] and liked it there more. She has 4 sisters, 2 nearby and involved in care. She has 1 son-[name], 35yo, who lives about an hour and a half away in [place]. She is divorced.  ??  Goes to [employer] in [place] and gets a lot of support. Her church friend [name] goes with her to chemo appts. Her sisters will go too.    Advance Care Planning: Advance directives scanned into system  HCPOA: See ACP note  Living Will: See ACP note  ACP note: Yes, advance directive scanned in on November 6    Objective     Opioid Risk Tool:      Opioid Risk Tool:   Female  Female    Family history of substance abuse      Alcohol   1  3    Illegal drugs  2  3    Rx drugs  4  4    Personal history of substance abuse      Alcohol  3  3    Illegal drugs  4  4    Rx drugs  5  5    Age between 16--45 years  1  1    History of preadolescent sexual abuse  3 0    Psychological disease      ADD, OCD, bipolar, schizophrenia  2  2    Depression  1  1    Total: 7  (<3 low risk, 4-7 moderate risk, >8 high risk)      Oncology History Overview Note   Identifying Statement:  Isidora Laham is a 65 y.o. female diagnosed with a right posterior frontal lobe metastasis (breast primary) status post resection 01/15/2019 and 5/5 fractions of SBRT (2500 Gy via CyberKnife) to the resection cavity.    Treatment History:  09/05/17: S/p SRS to 7 lesions  01/15/19: S/p resection, followed by SRS 2500 cGy   03/04/19: KPS 80; MRI with postsurgical changes versus residual disease; RTC in 2 weeks w/ MRI   04/15/19: KPS NA; MRI w/ SD; no study; RTC 6 weeks w/ MRI   06/10/19: KPS 80; MRI w/ SD; RTC 4 weeks  07/15/19: KPS 80; MRI w/ SD; RTC 8 weeks  08/19/19: KPS 80; start nortriptyline for HAs; RTC 4 weeks w/ MRI  09/16/19: KPS 80; con't nortriptyline for HAs; RTC 2 mos w/ MRI  11/11/19: KPS 80; MRI w/ SD though w/ small infarct; proceed w/ CVA workup; start 81mg  ASA; encouraged smoking cessation; con't nortriptyline; RTC to discuss results  11/25/19: KPS 80; discussed CVA workup results; MRA unremarkable; con't smoking cessation efforts; con't nortriptyline for HAs; con't 81mg  ASA, start Lipitor; f/u with PCP for COPD eval; RTC 2 mos w/ MRI     Malignant neoplasm of overlapping sites of right female breast (CMS-HCC)   2017 -  Presenting Symptoms    Large open wound RT breast which began as nipple inversion. Patient states that she ignored for a long time.  Physical exam of the area of concern in the RTbreast demonstrates a large open wound in the lateral right breast. Saw PCP 01/27/16 Rx Keflex.     02/04/2016 Interval Scan(s)    MMG/US: 3.1 (3.0) cm irregular spiculated dense mass in lateral Rt breast w nipple and skin retraction. 2 subcentimeter irregular  masses in the RUOQ  (10', 11') (possible satellite lesions). Large Rt axillary lymph node 1.7. Lt breast clear. BIRAD 5.     02/08/2016 Biopsy    US guided Rt. Axillary LN biopsy:  Gr 3, IDC with apocrine features. ER (-), PR (31-40), HER-2 (3+). Noted to have multiple skin lesions. Poor access to breast mass due open 10-15 cm wound risk of pain and bleeding.     02/09/2016 Initial Diagnosis    Malignant neoplasm of overlapping sites of right female breast (RAF-HCC)     02/10/2016 Interval Scan(s)    CT CAP: Large, necrotic right breast mass with wide open tract to the skin, consistent with known right breast malignancy.  Numerous enlarged right axillary, subpectoral, mediastinal, bilateral hilar lymph nodes and innumerable bilateral pulmonary nodules     02/10/2016 Interval Scan(s)    NM Bone scan: No osseous metastatic disease.     04/28/2016 - 03/10/2020 Chemotherapy    OP TRASTUZUMAB (EVERY 21 DAYS)  Trastuzumab 8 mg/kg loading then 6 mg/kg every 21 days     07/13/2016 Genetics    STRATA  ??? ERBB2 copy number alteration  Estimated copy number: 40, confidence interval: 35.7 - 44.0, cellularity: 50%  Associated FDA-approved targeted therapies in breast cancer: trastuzumab, lapatinib, ado-trastuzumab emtansine, pertuzumab  ??? PIK3CA p.[id number]     01/03/2020 -  Cancer Staged    CT CAP 01/03/2020 which showed a new lingular opacity measuring 1.9 cm in the right lung. There was also slight increase in the right breast mass from 1.2-> 1.5 cm.  Bone scan shows no osseous metastases. Overall I considered these findings indeterminate and ppted to repeat CT CAP in 1 month     02/03/2020 -  Cancer Staged    CT chest 02/03/20 which showed that the lingular lesion has persisted and increased in size.  No mediastinal adenopathy reported.  She continues to report a nonproductive cough and mild dyspnea.  She is a chronic smoker and currently smokes on average half a pack per day.  My major concern is that this could be a second primary lung cancer as her other sites of diseases stable on current systemic therapy       02/03/2020 Endocrine/Hormone Therapy    Stop Tamoxifen, Add Fasoldex, continue Q3week Hercpetin     03/23/2020 - 02/09/2021 Chemotherapy    OP BREAST ADO-TRASTUZUMAB EMTANSINE  ado-trastuzumab emtansine 3.6 mg/kg IV on day 1, every 21 days     04/12/2021 -  Chemotherapy    Tucatinib + Capecitabine  OP TRASTUZUMAB (EVERY 21 DAYS)  trastuzumab 8 mg/kg IV LOADING, then 6 mg/kg IV MAINT, every 21 days     Brain metastases (CMS-HCC)   08/07/2017 Initial Diagnosis    Brain metastases (CMS-HCC)    Summary of Radiosurgery   Rx:7 brain met: 09/05/2017: 2,000/2,000 cGy  Sec:R Frontal: 09/05/2017: 2,000 cGy  Sec:L Post Fr: 09/05/2017: 2,000 cGy  Sec:L Ant Fro: 09/05/2017: 2,000 cGy  Sec:R Sup Oc: 09/05/2017: 2,000 cGy  Sec:L Occipita: 09/05/2017: 2,000 cGy  Sec:R Inf Occi: 09/05/2017: 2,000 cGy  Sec:L Tempor: 09/05/2017: 2,000 cGy  Rx:Lt Med Oc: 09/13/2018: 2,000/2,000 cGy  Rx:Rt Frontal: : 2,500/2,500 cGy    01/15/2019: Craniotomy and resection of right posterior frontal lobe metastasis. Followed by [facility]    03/04/19: KPS 80; MRI with postsurgical changes versus residual disease; RTC in 2 weeks w/ MRI     01/03/2020 -  Cancer Staged    CT CAP 01/03/2020 which showed a new lingular opacity  measuring 1.9 cm in the right lung. There was also slight increase in the right breast mass from 1.2-> 1.5 cm.  Bone scan shows no osseous metastases. Overall I considered these findings indeterminate and ppted to repeat CT CAP in 1 month     02/03/2020 -  Cancer Staged    CT chest 02/03/20 which showed that the lingular lesion has persisted and increased in size.  No mediastinal adenopathy reported.  She continues to report a nonproductive cough and mild dyspnea.  She is a chronic smoker and currently smokes on average half a pack per day.  My major concern is that this could be a second primary lung cancer as her other sites of diseases stable on current systemic therapy       02/03/2020 Endocrine/Hormone Therapy    Stop Tamoxifen, Add Fasoldex, continue Q3week Hercpetin     Metastatic breast cancer (CMS-HCC)   01/25/2019 Initial Diagnosis    Metastatic breast cancer (CMS-HCC)     03/23/2020 - 02/09/2021 Chemotherapy    OP BREAST ADO-TRASTUZUMAB EMTANSINE  ado-trastuzumab emtansine 3.6 mg/kg IV on day 1, every 21 days     04/12/2021 -  Chemotherapy    Tucatinib + Capecitabine  OP TRASTUZUMAB (EVERY 21 DAYS)  trastuzumab 8 mg/kg IV LOADING, then 6 mg/kg IV MAINT, every 21 days         Patient Active Problem List   Diagnosis   ??? Malignant neoplasm of overlapping sites of right female breast (CMS-HCC)   ??? Peripheral neuropathy   ??? Insomnia   ??? Brain metastases (CMS-HCC)   ??? Nausea   ??? Closed fracture of one rib with routine healing   ??? Diarrhea of presumed infectious origin   ??? Failure to thrive in adult   ??? Tobacco use   ??? Chronic diarrhea   ??? Depression   ??? Hyponatremia   ??? Back pain   ??? Closed fracture of multiple pubic rami, right, initial encounter (CMS-HCC)   ??? Macrocytosis   ??? Pelvic fracture (CMS-HCC)   ??? Metastatic breast cancer (CMS-HCC)   ??? Migraine without aura and without status migrainosus, not intractable   ??? Essential hypertension   ??? CVA (cerebral vascular accident) (CMS-HCC)   ??? DDD (degenerative disc disease), cervical   ??? Wheezing   ??? Dyslipidemia   ??? Angina pectoris (CMS-HCC)   ??? Papillary fibroelastoma of heart   ??? Antineoplastic chemotherapy induced anemia   ??? Celiac disease   ??? Iron deficiency anemia due to chronic blood loss   ??? Dry eye syndrome, bilateral   ??? Meibomian gland dysfunction (MGD) of both eyes   ??? Glaucoma suspect of both eyes   ??? Incipient cataract of both eyes   ??? 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)       Past Medical History:   Diagnosis Date   ??? Abnormal ECG unsure   ??? Alcoholism (CMS-HCC)    ??? Allergic rhinitis    ??? Anemia    ??? Anxiety     insomnia   ??? Arthritis not sure   ??? Brain concussion    ??? Breast cancer (CMS-HCC)     chemo for now with mets   ??? COPD (chronic obstructive pulmonary disease) (CMS-HCC)    ??? Depression    ??? Dysphagia    ??? Genitourinary disease    ??? GERD (gastroesophageal reflux disease)    ??? Headache    ??? HTN (hypertension)    ??? Hyperlipidemia    ??? Insomnia    ???  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 Stroke (CMS-HCC) Nov. 2020    Dr. Theodoro Kalata   ??? Tobacco dependence        Past Surgical History:   Procedure Laterality Date   ??? CESAREAN SECTION     ??? CHOLECYSTECTOMY     ??? FINGER SURGERY Right     trigger finger   ??? GANGLION CYST EXCISION Left     hand   ??? HYSTERECTOMY     ??? LYMPH NODE BIOPSY Right 02/08/2016    Axillary   ??? PR BRNSCHSC TNDSC EBUS DX/TX INTERVENTION PERPH LES Left 03/05/2020    Procedure: Bronch, Rigid Or Flexible, Including Fluoro Guidance, When Performed; With Transendoscopic Ebus During Bronchoscopic Diagnostic Or Therapeutic Intervention(S) For Peripheral Lesion(S);  Surgeon: Jerelyn Charles, MD;  Location: MAIN OR [facility];  Service: Pulmonary   ??? PR BRONCHOSCOPY,COMPUTER ASSIST/IMAGE-GUIDED NAVIGATION Left 03/05/2020    Procedure: BRONCHOSCOPY, RIGID OR FLEXIBLE, INCLUDE FLUORO WHEN PERFORMED; W/COMPUTER-ASSIST, IMAGE-GUIDED NAVIGATION;  Surgeon: Jerelyn Charles, MD;  Location: MAIN OR [facility];  Service: Pulmonary   ??? PR BRONCHOSCOPY,DIAGNOSTIC W LAVAGE Left 03/05/2020    Procedure: Bronchoscopy, Rigid Or Flexible, Include Fluoroscopic Guidance When Performed; W/Bronchial Alveolar Lavage;  Surgeon: Jerelyn Charles, MD;  Location: MAIN OR [facility];  Service: Pulmonary   ??? PR BRONCHOSCOPY,TRANSBRON ASPIR BX Left 03/05/2020    Procedure: Bronchoscopy, Rigid/Flex, Incl Fluoro; W/Transbronch Ndl Aspirat Bx, Trachea, Main Stem &/Or Lobar Bronchus;  Surgeon: Jerelyn Charles, MD;  Location: MAIN OR [facility];  Service: Pulmonary   ??? PR BRONCHOSCOPY,TRANSBRONCH BIOPSY Left 03/05/2020    Procedure: Bronchoscopy, Rigid/Flexible, Include Fluoro Guidance When Performed; W/Transbronchial Lung Bx, Single Lobe;  Surgeon: Jerelyn Charles, MD;  Location: MAIN OR [facility];  Service: Pulmonary   ??? PR CATH PLACE/CORON ANGIO, IMG SUPER/INTERP,W LEFT HEART VENTRICULOGRAPHY N/A 05/07/2020    Procedure: Left Heart Catheterization;  Surgeon: Lesle Reek, MD;  Location: [facility] CATH;  Service: Cardiology   ??? PR COLONOSCOPY W/BIOPSY SINGLE/MULTIPLE N/A 10/21/2016    Procedure: COLONOSCOPY, FLEXIBLE, PROXIMAL TO SPLENIC FLEXURE; WITH BIOPSY, SINGLE OR MULTIPLE;  Surgeon: Monte Fantasia, MD;  Location: GI PROCEDURES [facility];  Service: Gastroenterology   ??? PR EXCIS SUPRATENT BRAIN TUMOR Right 01/15/2019    Procedure: CRANIECTOMY; EXC BRAIN TUMOR-SUPRATENTORIAL;  Surgeon: Edison Simon, MD;  Location: MAIN OR [facility];  Service: Neurosurgery   ??? PR INCISE FINGER TENDON SHEATH Left 06/17/2019    Procedure: R-20 TENDON SHEATH INCISION (EG, FOR TRIGGER FINGER);  Surgeon: Daisy Lazar, MD;  Location: ASC OR [facility];  Service: Orthopedics   ??? PR MICROSURG TECHNIQUES,REQ OPER MICROSCOPE Right 01/15/2019    Procedure: MICROSURGICAL TECHNIQUES, REQUIRING USE OF OPERATING MICROSCOPE (LIST SEPARATELY IN ADDITION TO CODE FOR PRIMARY PROCEDURE);  Surgeon: Edison Simon, MD;  Location: MAIN OR [facility];  Service: Neurosurgery   ??? PR STEREOTACTIC COMP ASSIST PROC,CRANIAL,INTRADURAL Right 01/15/2019    Procedure: STEREOTACTIC COMPUTER-ASSISTED (NAVIGATIONAL) PROCEDURE; CRANIAL, INTRADURAL;  Surgeon: Edison Simon, MD;  Location: MAIN OR [facility];  Service: Neurosurgery   ??? PR UPPER GI ENDOSCOPY,BIOPSY N/A 10/21/2016    Procedure: UGI ENDOSCOPY; WITH BIOPSY, SINGLE OR MULTIPLE;  Surgeon: Monte Fantasia, MD;  Location: [facility];  Service: Gastroenterology   ??? PR UPPER GI ENDOSCOPY,BIOPSY N/A 04/02/2018    Procedure: UGI ENDOSCOPY; WITH BIOPSY, SINGLE OR MULTIPLE;  Surgeon: Wendall Papa, MD;  [facility];  Service: Gastroenterology   ??? SKIN BIOPSY  Current Outpatient Medications   Medication Sig Dispense Refill   ??? albuterol HFA 90 mcg/actuation inhaler Inhale 2 puffs every six (6) hours as needed for wheezing.     ??? aluminum-magnesium hydroxide-simethicone 200-200-20 mg/5 mL Susp 80 mL, diphenhydrAMINE 12.5 mg/5 mL Liqd 200 mg, nystatin 100,000 unit/mL Susp 8,000,000 Units, distilled water Liqd 80 mL, lidocaine 2% viscous 2 % Soln 80 mL Take 5 mL by mouth every six (6) hours as needed. Swish, gargle, spit or swallow if throat issues 80 mL 2   ??? buprenorphine 7.5 mcg/hour PTWK transdermal patch Place on the skin every seven (7) days for 28 days. Dose increase. Fill today 4 patch 0   ??? capecitabine (XELODA) 500 MG tablet Take 2 tablets (1,000 mg total) by mouth Two (2) times a day . For 14 days on and 7 days off 56 tablet 3   ??? clobetasoL (TEMOVATE) 0.05 % ointment Apply twice a day to rash on palm until smooth/clear. 30 g 1   ??? clonazePAM (KLONOPIN) 0.5 MG tablet Take 0.5 mg daily as needed for anxiety.  Do not exceed 0.5 mg/day. 30 tablet 0   ??? diclofenac sodium (VOLTAREN) 1 % gel Apply 2 g topically four (4) times a day as needed for arthritis or pain. 150 g 2   ??? diphenoxylate-atropine (LOMOTIL) 2.5-0.025 mg per tablet Take 1 tablet by mouth 4 (four) times a day as needed for diarrhea. 30 tablet 1   ??? dorzolamide (TRUSOPT) 2 % ophthalmic solution Administer 1 drop into the left eye Three (3) times a day. 10 mL 12   ??? empagliflozin (JARDIANCE) 10 mg tablet Take 1 tablet (10 mg total) by mouth in the morning. 30 tablet 11   ??? esomeprazole (NEXIUM) 40 MG capsule Take 1 capsule (40 mg total) by mouth daily. 90 capsule 3   ??? lasmiditan 50 mg Tab Take 50 mg by mouth once as needed (Once daily in 24hr period PRN migraine) for up to 1 dose. 7 tablet 1   ??? lidocaine (LIDODERM) 5 % patch Place 1 patch on the skin in the morning. Apply to affected area for 12 hours only each day (then remove patch). 30 patch 0   ??? lisinopriL-hydrochlorothiazide (PRINZIDE,ZESTORETIC) 20-25 mg per tablet Take 1 tablet by mouth in the morning. 90 tablet 0   ??? MAGNESIUM ORAL Take by mouth daily. OTC     ??? naloxone (NARCAN) 4 mg nasal spray One spray in either nostril once for known/suspected opioid overdose. May repeat every 2-3 minutes in alternating nostril til EMS arrives 2 each 0   ??? nicotine (NICODERM CQ) 21 mg/24 hr patch Place 1 patch on the skin daily. 28 patch 2   ??? nicotine polacrilex (NICORETTE) 4 MG gum Apply 1 each (4 mg total) to cheek every hour as needed for smoking cessation. 110 each 2   ??? ondansetron (ZOFRAN-ODT) 8 MG disintegrating tablet Take 1 tablet (8 mg total) by mouth every twelve (12) hours as needed for nausea. (Patient taking differently: Take 8 mg by mouth every twelve (12) hours as needed for nausea. Patient has not started taking yet (will take with chemo)) 30 tablet 1   ??? polyethylene glycol (MIRALAX) 17 gram/dose powder Take 17 g by mouth Three (3) times a day as needed (constipation). 850 g 3   ??? potassium chloride (KLOR-CON) 10 MEQ CR tablet Take 1 tablet (10 mEq total) by mouth Two (2) times a day. 180 tablet 0   ??? pregabalin (LYRICA) 200  MG capsule Take 1 capsule (200 mg total) by mouth Two (2) times a day. 60 capsule 2   ??? prochlorperazine (COMPAZINE) 10 MG tablet Take 1 tablet (10 mg total) by mouth every six (6) hours as needed for nausea. 30 tablet 6   ??? saliva stimulant comb. no.3 (BIOTENE MOISTURIZING MOUTH) Spry Take 1 spray by mouth every two (2) hours as needed (dry mouth). 44.3 mL prn   ??? senna (SENOKOT) 8.6 mg tablet Take 3 tablets by mouth daily.     ??? thiamine HCl (VITAMIN B-1 ORAL) Take by mouth daily.      ??? traZODone (DESYREL) 50 MG tablet Take 1.5 tablets (75 mg total) by mouth nightly. 45 tablet 0   ??? tucatinib (TUKYSA) 150 mg tablet Take 1 tablet (150 mg total) by mouth two (2) times a day . 60 tablet 1   ??? tucatinib (TUKYSA) 50 mg tablet Take 2 tablets (100 mg total) by mouth two (2) times a day . 120 tablet 3   ??? venlafaxine (EFFEXOR-XR) 150 MG 24 hr capsule Take 150 mg capsule in conjunction with 75 mg capsule for a total of 225 mg by mouth daily 30 capsule 2   ??? venlafaxine (EFFEXOR-XR) 75 MG 24 hr capsule Take 75 mg capsule in conjunction with 150 mg capsule by mouth daily for a total of 225 mg by mouth daily 30 capsule 2     No current facility-administered medications for this visit.       Allergies:   Allergies   Allergen Reactions   ??? Adhesive Rash   ??? Decadron [Dexamethasone] Anxiety     Mania as well   ??? Morphine Itching   ??? Tetracycline      Other reaction(s): Other (See Comments)   ??? Oxycodone      Night terrors   ??? Tegaderm Adhesive-No Drug-Allergy Check Rash       Family History:  Cancer-related family history includes Cancer in her father, maternal grandfather, maternal uncle, maternal uncle, and paternal aunt.  She indicated that the status of her mother is unknown. She indicated that the status of her father is unknown. She indicated that the status of her brother is unknown. She indicated that the status of her maternal grandmother is unknown. She indicated that the status of her maternal grandfather is unknown. She indicated that the status of her paternal grandmother is unknown. She indicated that the status of her paternal grandfather is unknown. She indicated that the status of her maternal aunt is unknown. She indicated that the status of her paternal uncle is unknown. She indicated that the status of her neg hx is unknown. She indicated that the status of her other is unknown.           Lab Results   Component Value Date    CREATININE 0.62 04/06/2021     Lab Results   Component Value Date    ALKPHOS 505 (H) 04/06/2021    BILITOT 0.6 04/06/2021    BILIDIR 0.30 03/29/2021    PROT 7.6 04/06/2021    ALBUMIN 3.8 04/06/2021    ALT 64 (H) 04/06/2021    AST 100 (H) 04/06/2021             Burtis Junes, FNP-BC, [facility]  Outpatient Oncology Palliative Care Service  [facility]  [address], [place], [place] [address]  [phone]             The patient reports they are currently: at home.  I spent 25 minutes on the phone with the patient on the date of service. I spent an additional  10  minutes on pre- and post-visit activities on the date of service.     The patient was physically located in [place] or a state in which I am permitted to provide care. The patient and/or parent/guardian understood that s/he may incur co-pays and cost sharing, and agreed to the telemedicine visit. The visit was reasonable and appropriate under the circumstances given the patient's presentation at the time.    The patient and/or parent/guardian has been advised of the potential risks and limitations of this mode of treatment (including, but not limited to, the absence of in-person examination) and has agreed to be treated using telemedicine. The patient's/patient's family's questions regarding telemedicine have been answered.     If the visit was completed in an ambulatory setting, the patient and/or parent/guardian has also been advised to contact their provider???s office for worsening conditions, and seek emergency medical treatment and/or call 911 if the patient deems either necessary.

## 2021-06-03 ENCOUNTER — Telehealth: Admit: 2021-06-03 | Discharge: 2021-06-04 | Payer: MEDICARE | Attending: Psychiatry | Primary: Psychiatry

## 2021-06-03 NOTE — Unmapped (Signed)
Comprehensive Cancer Support Program (CCSP) - Psychiatry Outpatient Clinic   After Visit Summary    It was a pleasure to see you today in the [facility]???s Comprehensive Cancer Support Program (CCSP). The CCSP is a multidisciplinary program dedicated to helping patients, caregivers, and families with cancer treatment, recovery and survivorship.      To schedule, cancel, or change your appointment:  Please call the [facility] schedulers at [phone], Monday through Friday 8AM - 5PM.  Someone will return your call within 24 hours.      If you have a question about your medicines or you need to contact your provider:  Please call the CCSP program coordinator, Myrene Galas, at [phone].     For after hours urgent issues, you may call [phone] or call the I need to talk line at [phone]-TALK ([phone]) anytime 24/7.    CCSP Patient and Family Resource Center: [phone].    CCSP Website:  [email or web address]    For prescription refills, please allow at least 24 hours (during business hours, M-F) for providers to call in refills to your pharmacy. We are generally unable to accommodate same-day requests for refills.     If you are taking any controlled substances (such as anxiety or sleep medications), you must use them as the directions say to use them. We generally do not provide early refills over the phone without clear reason, and it would be inappropriate to obtain the medications from other doctors. We routinely use the [place] controlled substance database to monitor prescription drug use.

## 2021-06-03 NOTE — Unmapped (Signed)
[facility]  Comprehensive Cancer Support Program/Psychiatry   Established Patient E&M Service - Outpatient     Encounter Description: video and telephone      Name: Brooke [name]  Date: 06/03/2021  MRN: [id number]  DOB: [birth year]    Assessment:    Brooke [name] presents for follow-up evaluation for depression and anxiety. She has had recurrent falls with concern for multifactorial contributions including neuropathy, brain metastases, recent alcohol intake.  She also had recent thyroid labs concerning for hypothyroidism (high TSH, low free T4). Per her own reports of memory and organization deficits along with [facility] of 20 completed in February 2022, the patient meets criteria for Mild Neurocognitive Disorder. Discussed concerns regarding the patient's cognitive and functional decline with the patient and with her sister Brooke [name], who is her primary support, in person in February 2022.     On evaluation 06/03/21, patient reports stable mood. Primary focus today was again on functional and cognitive concerns. Patient was unable to sign into MyChart or successfully click direct text message link sent to her for an appointment. Of note, her current phone number is [phone]. Will attempt to have patient seen in person in subsequent visits and coordinate with other providers. Spoke to her sister Brooke [name] to schedule a follow-up appointment and will continue to coordinate with her.    Identifying Information:  Brooke [name] is a 65 y.o. 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     Risk Assessment:  An assessment of suicide and violence risk factors was performed as part of this evaluation and is not significantly changed from the last visit.   While future psychiatric events cannot be accurately predicted, the patient does not currently require acute inpatient psychiatric care and does not currently meet [facility] involuntary commitment criteria.      Plan:    Problem 1: Major depressive disorder, recurrent episode   Status of problem: chronic and stable  Interventions:   - Continue Effexor XR 225 mg PO daily to target mood, anxiety, and hot flashes.     -Refilled 06/03/21  - Previously established with CCSP psychotherapist Brooke [name]. Patient will reach out again.       Problem 2: Unspecified anxiety disorder  Status of problem: chronic and stable  Interventions:   - Continue Klonopin 0.5mg  qday PRN anxiety (prescribed #30 on 04/29/21)  - She has historically used this medication very sparingly and continues to do so (reportedly about 2x/week)  - Discussed risk/benefit again with her on 11/12/20 and 02/03/21, in particular focusing on potential of benzodiazepines to cause or worsen cognitive deficits.  - On 03/11/21, strongly discouraged her from taking klonopin unless absolutely necessary for intense anxiety    Problem 3: Insomnia  Status of problem:  chronic and stable  Interventions:   - Continue Trazodone 75 mg    --Refilled 06/03/21    Problem 4: Potential Sleep Apnea  Status of problem: Stable  Interventions:  - Ambulatory referral to Sleep center was placed in 10/2020 (still pending as of 06/03/21)    Problem 5: Mild Neurocognitive Disorder  Status of problem: New problem, MOCA 20/30 on 02/03/21  Interventions:  - Continue to monitor  - Recommended pill organizer, alarms on phone and schedule/spreadsheet where she can mark doses once she has taken them  - Case manager will  be present for next eval by [facility] as they initially denied Brooke [name] request for home health aid due to feeling as though she did not need one.     Follow-up: TBD with Brooke [name]. Fellow transition discussed. Spoke to patient's sister, Brooke [name], by phone on 06/04/21 and she will communicate a good appointment time via MyChart when she has been able to look at their schedule.      Psychotherapy provided:  No billable psychotherapy service provided.    Patient has been given this writer's contact information as well as the [facility] Psychiatry urgent line number. The patient has been instructed to call 911 for emergencies.    Patient and plan of care were discussed with the Attending MD,Brooke [name], who agrees with the above statement and plan.      Subjective:    Chief complaint:  Follow-up psychiatric evaluation for anxiety and depression    Interval History:   Patient reports that she has her ups and downs. Family doesn't come over to see her, and she's not going out of her way to see them. Feels sort of like a hermit but not all the time. She is sleeping a lot. She will wake up at 4-5am and sleep to almost noon. She usually goes to bed between 7:30-9 but then it's a while before she goes to sleep. She states that if she forgets her trazodone then she sleeps more during the day. If she takes it, then she sleeps well overnight and doesn't sleep during the day. She is still eating and trying to do some laundry. Her back pain has been really bad.     She reports that she needs patches put on her back for pain control. Her sister has to come over to help.   No one is teaching her how to use her phone. She is worried that she will miss a call from someone.   Gas has been really expensive, and she is thinking that she may have to skip appointments.  Uses klonopin 2-3 days per week. She usually takes one when she goes to the hospital.   Denies any alcohol use.   Endorses SI one time, though later states that the thought was actually I don't want to keep doing treatments that don't help. I'm not doing any more treatments or taking any more pills. However, she states that she just isn't ready to go at this point.    Objective:      Mental Status Exam:  Appearance:    NA telephone   Motor:   NA telephone   Speech/Language:    Normal rate, volume, tone, fluency and Language intact, well formed   Mood:   up and down   Affect:   NA telephone   Thought process and Associations:   Logical, linear, clear, coherent, goal directed   Abnormal/psychotic thought content:     Denied SI, denied HI. No evidence of paranoia or delusions.   Perceptual disturbances:     No AVH     Memory/recall  Historically poor   Attention  Able to concentrate and attend to conversation but not formally tested   Orientation  Did not formally assess but seems grossly oriented   Other:   Insight and judgment fair.  Fair impulse control.       Visit was completed by video (or phone) and the appropriate disclaimer has been included below.         The patient reports they  are currently: at home. I spent 40 minutes on the phone with the patient on the date of service. I spent an additional 15 minutes on pre- and post-visit activities on the date of service.     The patient was physically located in [place] or a state in which I am permitted to provide care. The patient and/or parent/guardian understood that s/he may incur co-pays and cost sharing, and agreed to the telemedicine visit. The visit was reasonable and appropriate under the circumstances given the patient's presentation at the time.    The patient and/or parent/guardian has been advised of the potential risks and limitations of this mode of treatment (including, but not limited to, the absence of in-person examination) and has agreed to be treated using telemedicine. The patient's/patient's family's questions regarding telemedicine have been answered.     If the visit was completed in an ambulatory setting, the patient and/or parent/guardian has also been advised to contact their provider???s office for worsening conditions, and seek emergency medical treatment and/or call 911 if the patient deems either necessary.      Fanny Skates, MD  06/03/2021

## 2021-06-04 MED ORDER — VENLAFAXINE ER 75 MG CAPSULE,EXTENDED RELEASE 24 HR
ORAL_CAPSULE | 2 refills | 0 days | Status: CP
Start: 2021-06-04 — End: ?

## 2021-06-04 MED ORDER — TRAZODONE 50 MG TABLET
ORAL_TABLET | Freq: Every evening | ORAL | 1 refills | 30 days | Status: CP
Start: 2021-06-04 — End: ?

## 2021-06-04 MED ORDER — VENLAFAXINE ER 150 MG CAPSULE,EXTENDED RELEASE 24 HR
ORAL_CAPSULE | 2 refills | 0 days | Status: CP
Start: 2021-06-04 — End: ?

## 2021-06-14 ENCOUNTER — Ambulatory Visit: Admit: 2021-06-14 | Discharge: 2021-06-15 | Payer: MEDICARE

## 2021-06-14 DIAGNOSIS — C50919 Malignant neoplasm of unspecified site of unspecified female breast: Principal | ICD-10-CM

## 2021-06-14 DIAGNOSIS — C50811 Malignant neoplasm of overlapping sites of right female breast: Principal | ICD-10-CM

## 2021-06-14 DIAGNOSIS — K900 Celiac disease: Principal | ICD-10-CM

## 2021-06-14 DIAGNOSIS — R110 Nausea: Principal | ICD-10-CM

## 2021-06-14 MED ADMIN — trastuzumab (HERCEPTIN) 380 mg in sodium chloride (NS) 0.9 % 250 mL IVPB: 6 mg/kg | INTRAVENOUS | @ 15:00:00 | Stop: 2021-06-14

## 2021-06-14 MED ADMIN — sodium chloride (NS) 0.9 % infusion: 100 mL/h | INTRAVENOUS | @ 14:00:00 | Stop: 2021-06-14

## 2021-06-14 MED ADMIN — heparin, porcine (PF) 100 unit/mL injection 500 Units: 500 [IU] | INTRAVENOUS | @ 15:00:00 | Stop: 2021-06-14

## 2021-06-14 NOTE — Unmapped (Signed)
Patient arrived in the infusion clinic at 0915.  Weight and Vitals were obtained. Port was accessed, flushed with blood return, dressing clean, dry, and intact.  No labs or premedications were ordered to be administered.  Patient was chemotherapy regiment as ordered without complications while in the clinic.  Port was flushed with blood return, heparin locked, then de-accessed area covered with 2x2 gauze and ban-aid.  Patient was given printed after visit summary then discharged home to self care.  Marland Kitchen

## 2021-06-14 NOTE — Unmapped (Signed)
Outpatient Oncology Social Work  Follow Up       General Psychosocial Needs:  T/C from patient, who states that [employer] told her they have not yet rec'd PCS Referral that Dr. Burtis Junes, FNP faxed on 05/28/21.  Sent message to Burtis Junes, asking her to refax the PCS referral to [facility] at their number [phone].    Follow-Up Plan:  Pt has this SW'ers contact information and will reach out for psychosocial assistance as needed.     Claris Gladden, OSW-C  Oncology Outpatient Social Worker  Phone: [phone]

## 2021-06-15 ENCOUNTER — Institutional Professional Consult (permissible substitution): Admit: 2021-06-15 | Discharge: 2021-06-16 | Payer: MEDICARE | Attending: Pharmacist | Primary: Pharmacist

## 2021-06-15 NOTE — Unmapped (Addendum)
Pharmacist Phone Follow-Up    Cancer Team  Medical Oncology: Dr. [name]  Reason for call: Oral chemotherapy management  Current treatment: Trastuzumab + tucatininb + capecitabine     Assessment/Plan  Breast cancer:  Brooke [name] is a 65 yo woman with HER2+ metastatic breast cancer who started trastuzumab, tucatinib, and capecitabine on 4/18.  Tucatinib and capecitabine have been dose reduced to decrease diarrhea and improve overall tolerance.  Her current dose of capecitabine is 1000 mg BID x 14 days on and 7 days off.  Capecitabine cycles are to start on day 1 with her trastuzumab infusions.  Tucatinb dose is 250 mg BID, ongoing.    Brooke [name] believes that she continued capecitabine last week, instead of holding it for one week.  She does not recall if she took it the week before.  Since how she took the capecitabine is not clear, I instructed her to hold capecitabine for 7 days, resume for 7 days, then take the next 7 days off, prior to the next cycle of trastuzumab.    She has had diarrhea and did not take Lomotil because she lost her medication.  Diarrhea has resolved with no loose stools x 2 days.  She has found her Lomotil.    Plan:  1. Brooke [name] may have not have stopped capecitabine last week.  She was instructed to hold capecitabine for this week, starting today (06/15/21) and then resume capecitabine 1000 mg BID on 06/22/21 and continue for 7 days through 06/28/21.  She will then hold capecitabine for 7 days and then resume a normal cycle of 14 days on and 7 days off starting 07/06/21.  2. Continue tucatinib 250  mg BID    3. Continue Lomotil and Imodium as needed for diarrhea  4. 07/06/21:  Trastuzumab infusion, labs, and provider appointment    06/16/21 Addendum:  Brooke [name] called me to let me know that she has run out of tucatinib 50 mg tablets.  She only has 150 mg tablets remaining.  She is concerned that she may have lost the tablets down the sink drain.  Her sister, [name], thinks she may have taken a few extra tucatinib tablets at some point during the past week.  I instructed the patient to just take 150 mg BID for now since she has these tablets.  She is able to get a refill from the [facility] on 06/18/21.  She can then start 250 mg BID once the 50 mg tablets arrive.  ________________________________________________________________________     Interval History   Brooke [name] is a 65 y.o. female with metastatic breast cancer on trastuzumab, tucatinib and capecitabine.  Today is day 2 of a new cycle.  She received her trastuzumab infusion yesterday with no issues.  I tried to assess if she has been adherent to the tucatinib and capecitabine doses and schedule.  She was able to verbalize the correct dose and frequency of tucatinib.  Her sister, [name], said that Brooke [name] took a higher dose of tucatinib for several days until she caught the error.  Brooke [name] thinks she may have missed > 1 dose of tucatinb but could not recall.  Brooke [name] reported that she does not recall stopping capecitabine last week.  It is unclear if she took 3 weeks of capecitabine.      Brooke [name] reports having diarrhea for 48 hours starting on 6/18.  She could not find her Lomotil so she did not take any anti-diarrheal medication.  The  diarrhea resolved on its own and she has not had a loose stool for 2 days.  She did confirm that she found the Lomotil and I stressed to use as needed for diarrhea.    She reports having sensitive gums.  I instructed her to start using a salt water mouth rinse BID.    Oral chemotherapy regimen:??   Starting 5/30 doses reduced to:   Tucatinib 250 mg BID (started on 6/5) --> she took 150 mg po BID (5/30-6/4) then increased on 6/5 to 250 mg after receiving 50 mg tabs from the pharmacy  ????????????????????????Capecitabine 1000 mg BID x 14 days then 7 days off  ????????????????????????Trastuzumab IV q 3weeks     Started 04/12/21:  ????????????????????????Tucatinib 300 mg po BID continuous  ????????????????????????Capecitabine 1500 mg BID x 14 days then 7 days off (stopped during first cycle d/t diarrhea)  ????????????????????????Trastuzumab IV q 3weeks  Start date: 04/12/21  Pharmacy: [facility]     Adherence: Unclear - may have missed multiple doses of tucatinib.  She also reports not stopping capecitabine last week - so she may have had 7 additional doses for this past cycle.    Adverse Effects:   1. Diarrhea - encouraged her to keep Lomotil in a place where she can find it  2. Sore gums - instructed her to start salt water mouth rinses    Drug interactions: Tucatinib is a strong CYP3A4 inhibitor.  It can potentially increase the concentrations of the following medications, requiring a dose reduction.  Will monitor.  1. Bupenorphine  2. Clonazepam  3. Trazodone    Capecitabine concentrations could potentially be reduced by esomperazole.  I do not recommend any changes to capecitabine dose d/t the unclear clinical impact.      Patient verbalized understanding of the above information.     I spent 20 minutes on the phone with the patient on the date of service. I spent an additional 15 minutes on pre- and post-visit activities.     The patient was physically located in [place] or a state in which I am permitted to provide care. The patient and/or parent/guardian understood that s/he may incur co-pays and cost sharing, and agreed to the telemedicine visit. The visit was reasonable and appropriate under the circumstances given the patient's presentation at the time.    The patient and/or parent/guardian has been advised of the potential risks and limitations of this mode of treatment (including, but not limited to, the absence of in-person examination) and has agreed to be treated using telemedicine. The patient's/patient's family's questions regarding telemedicine have been answered.     If the visit was completed in an ambulatory setting, the patient and/or parent/guardian has also been advised to contact their provider???s office for worsening conditions, and seek emergency medical treatment and/or call 911 if the patient deems either necessary.         Oncology History Overview Note   Identifying Statement:  Rosamaria Zakarian is a 65 y.o. female diagnosed with a right posterior frontal lobe metastasis (breast primary) status post resection 01/15/2019 and 5/5 fractions of SBRT (2500 Gy via CyberKnife) to the resection cavity.    Treatment History:  09/05/17: S/p SRS to 7 lesions  01/15/19: S/p resection, followed by SRS 2500 cGy   03/04/19: KPS 80; MRI with postsurgical changes versus residual disease; RTC in 2 weeks w/ MRI   04/15/19: KPS NA; MRI w/ SD; no study; RTC 6 weeks w/ MRI   06/10/19: KPS 80;  MRI w/ SD; RTC 4 weeks  07/15/19: KPS 80; MRI w/ SD; RTC 8 weeks  08/19/19: KPS 80; start nortriptyline for HAs; RTC 4 weeks w/ MRI  09/16/19: KPS 80; con't nortriptyline for HAs; RTC 2 mos w/ MRI  11/11/19: KPS 80; MRI w/ SD though w/ small infarct; proceed w/ CVA workup; start 81mg  ASA; encouraged smoking cessation; con't nortriptyline; RTC to discuss results  11/25/19: KPS 80; discussed CVA workup results; MRA unremarkable; con't smoking cessation efforts; con't nortriptyline for HAs; con't 81mg  ASA, start Lipitor; f/u with PCP for COPD eval; RTC 2 mos w/ MRI     Malignant neoplasm of overlapping sites of right female breast (CMS-HCC)   2017 -  Presenting Symptoms    Large open wound RT breast which began as nipple inversion. Patient states that she ignored for a long time.  Physical exam of the area of concern in the RTbreast demonstrates a large open wound in the lateral right breast. Saw PCP 01/27/16 Rx Keflex.     02/04/2016 Interval Scan(s)    MMG/US: 3.1 (3.0) cm irregular spiculated dense mass in lateral Rt breast w nipple and skin retraction. 2 subcentimeter irregular masses in the RUOQ  (10', 11') (possible satellite lesions). Large Rt axillary lymph node 1.7. Lt breast clear. BIRAD 5.     02/08/2016 Biopsy    US guided Rt. Axillary LN biopsy:  Gr 3, IDC with apocrine features. ER (-), PR (31-40), HER-2 (3+). Noted to have multiple skin lesions. Poor access to breast mass due open 10-15 cm wound risk of pain and bleeding.     02/09/2016 Initial Diagnosis    Malignant neoplasm of overlapping sites of right female breast (RAF-HCC)     02/10/2016 Interval Scan(s)    CT CAP: Large, necrotic right breast mass with wide open tract to the skin, consistent with known right breast malignancy.  Numerous enlarged right axillary, subpectoral, mediastinal, bilateral hilar lymph nodes and innumerable bilateral pulmonary nodules     02/10/2016 Interval Scan(s)    NM Bone scan: No osseous metastatic disease.     04/28/2016 - 03/10/2020 Chemotherapy    OP TRASTUZUMAB (EVERY 21 DAYS)  Trastuzumab 8 mg/kg loading then 6 mg/kg every 21 days     07/13/2016 Genetics    STRATA  ??? ERBB2 copy number alteration  Estimated copy number: 40, confidence interval: 35.7 - 44.0, cellularity: 50%  Associated FDA-approved targeted therapies in breast cancer: trastuzumab, lapatinib, ado-trastuzumab emtansine, pertuzumab  ??? PIK3CA [id number]     01/03/2020 -  Cancer Staged    CT CAP 01/03/2020 which showed a new lingular opacity measuring 1.9 cm in the right lung. There was also slight increase in the right breast mass from 1.2-> 1.5 cm.  Bone scan shows no osseous metastases. Overall I considered these findings indeterminate and ppted to repeat CT CAP in 1 month     02/03/2020 -  Cancer Staged    CT chest 02/03/20 which showed that the lingular lesion has persisted and increased in size.  No mediastinal adenopathy reported.  She continues to report a nonproductive cough and mild dyspnea.  She is a chronic smoker and currently smokes on average half a pack per day.  My major concern is that this could be a second primary lung cancer as her other sites of diseases stable on current systemic therapy       02/03/2020 Endocrine/Hormone Therapy    Stop Tamoxifen, Add Fasoldex, continue Q3week Hercpetin     03/23/2020 -  02/09/2021 Chemotherapy OP BREAST ADO-TRASTUZUMAB EMTANSINE  ado-trastuzumab emtansine 3.6 mg/kg IV on day 1, every 21 days     04/12/2021 -  Chemotherapy    Tucatinib + Capecitabine  OP TRASTUZUMAB (EVERY 21 DAYS)  trastuzumab 8 mg/kg IV LOADING, then 6 mg/kg IV MAINT, every 21 days     Brain metastases (CMS-HCC)   08/07/2017 Initial Diagnosis    Brain metastases (CMS-HCC)    Summary of Radiosurgery   Rx:7 brain met: 09/05/2017: 2,000/2,000 cGy  Sec:R Frontal: 09/05/2017: 2,000 cGy  Sec:L Post Fr: 09/05/2017: 2,000 cGy  Sec:L Ant Fro: 09/05/2017: 2,000 cGy  Sec:R Sup Oc: 09/05/2017: 2,000 cGy  Sec:L Occipita: 09/05/2017: 2,000 cGy  Sec:R Inf Occi: 09/05/2017: 2,000 cGy  Sec:L Tempor: 09/05/2017: 2,000 cGy  Rx:Lt Med Oc: 09/13/2018: 2,000/2,000 cGy  Rx:Rt Frontal: : 2,500/2,500 cGy    01/15/2019: Craniotomy and resection of right posterior frontal lobe metastasis. Followed by [facility]    03/04/19: KPS 80; MRI with postsurgical changes versus residual disease; RTC in 2 weeks w/ MRI     01/03/2020 -  Cancer Staged    CT CAP 01/03/2020 which showed a new lingular opacity measuring 1.9 cm in the right lung. There was also slight increase in the right breast mass from 1.2-> 1.5 cm.  Bone scan shows no osseous metastases. Overall I considered these findings indeterminate and ppted to repeat CT CAP in 1 month     02/03/2020 -  Cancer Staged    CT chest 02/03/20 which showed that the lingular lesion has persisted and increased in size.  No mediastinal adenopathy reported.  She continues to report a nonproductive cough and mild dyspnea.  She is a chronic smoker and currently smokes on average half a pack per day.  My major concern is that this could be a second primary lung cancer as her other sites of diseases stable on current systemic therapy       02/03/2020 Endocrine/Hormone Therapy    Stop Tamoxifen, Add Fasoldex, continue Q3week Hercpetin     Metastatic breast cancer (CMS-HCC)   01/25/2019 Initial Diagnosis    Metastatic breast cancer (CMS-HCC)     03/23/2020 - 02/09/2021 Chemotherapy    OP BREAST ADO-TRASTUZUMAB EMTANSINE  ado-trastuzumab emtansine 3.6 mg/kg IV on day 1, every 21 days     04/12/2021 -  Chemotherapy    Tucatinib + Capecitabine  OP TRASTUZUMAB (EVERY 21 DAYS)  trastuzumab 8 mg/kg IV LOADING, then 6 mg/kg IV MAINT, every 21 days          Pertinent Labs:   No visits with results within 1 Day(s) from this visit.   Latest known visit with results is:   Lab on 05/03/2021   Component Date Value Ref Range Status   ??? WBC 05/03/2021 4.9  3.6 - 11.2 10*9/L Final   ??? RBC 05/03/2021 3.43 (A) 3.95 - 5.13 10*12/L Final   ??? HGB 05/03/2021 11.9  11.3 - 14.9 g/dL Final   ??? HCT 16/09/9603 33.5 (A) 34.0 - 44.0 % Final   ??? MCV 05/03/2021 97.7 (A) 77.6 - 95.7 fL Final   ??? MCH 05/03/2021 34.6 (A) 25.9 - 32.4 pg Final   ??? MCHC 05/03/2021 35.4  32.0 - 36.0 g/dL Final   ??? RDW 54/08/8118 14.3  12.2 - 15.2 % Final   ??? MPV 05/03/2021 7.8  6.8 - 10.7 fL Final   ??? Platelet 05/03/2021 105 (A) 150 - 450 10*9/L Final   ??? Neutrophils % 05/03/2021 57.8  % Final   ???  Lymphocytes % 05/03/2021 29.7  % Final   ??? Monocytes % 05/03/2021 11.4  % Final   ??? Eosinophils % 05/03/2021 0.9  % Final   ??? Basophils % 05/03/2021 0.2  % Final   ??? Absolute Neutrophils 05/03/2021 2.8  1.8 - 7.8 10*9/L Final   ??? Absolute Lymphocytes 05/03/2021 1.5  1.1 - 3.6 10*9/L Final   ??? Absolute Monocytes 05/03/2021 0.6  0.3 - 0.8 10*9/L Final   ??? Absolute Eosinophils 05/03/2021 0.0  0.0 - 0.5 10*9/L Final   ??? Absolute Basophils 05/03/2021 0.0  0.0 - 0.1 10*9/L Final       Current medications:     Current Outpatient Medications   Medication Sig Dispense Refill   ??? albuterol HFA 90 mcg/actuation inhaler Inhale 2 puffs every six (6) hours as needed for wheezing.     ??? aluminum-magnesium hydroxide-simethicone 200-200-20 mg/5 mL Susp 80 mL, diphenhydrAMINE 12.5 mg/5 mL Liqd 200 mg, nystatin 100,000 unit/mL Susp 8,000,000 Units, distilled water Liqd 80 mL, lidocaine 2% viscous 2 % Soln 80 mL Take 5 mL by mouth every six (6) hours as needed. Swish, gargle, spit or swallow if throat issues 80 mL 2   ??? buprenorphine 10 mcg/hour PTWK transdermal patch Place 1 patch on the skin every seven (7) days for 28 days. Dose increase. Fill today 4 patch 0   ??? capecitabine (XELODA) 500 MG tablet Take 2 tablets (1,000 mg total) by mouth Two (2) times a day . For 14 days on and 7 days off 56 tablet 3   ??? clobetasoL (TEMOVATE) 0.05 % ointment Apply twice a day to rash on palm until smooth/clear. 30 g 1   ??? clonazePAM (KLONOPIN) 0.5 MG tablet Take 0.5 mg daily as needed for anxiety.  Do not exceed 0.5 mg/day. 30 tablet 0   ??? diclofenac sodium (VOLTAREN) 1 % gel Apply 2 g topically four (4) times a day as needed for arthritis or pain. 150 g 2   ??? diphenoxylate-atropine (LOMOTIL) 2.5-0.025 mg per tablet Take 1 tablet by mouth 4 (four) times a day as needed for diarrhea. 30 tablet 1   ??? dorzolamide (TRUSOPT) 2 % ophthalmic solution Administer 1 drop into the left eye Three (3) times a day. 10 mL 12   ??? empagliflozin (JARDIANCE) 10 mg tablet Take 1 tablet (10 mg total) by mouth in the morning. 30 tablet 11   ??? esomeprazole (NEXIUM) 40 MG capsule Take 1 capsule (40 mg total) by mouth daily. 90 capsule 3   ??? lasmiditan 50 mg Tab Take 50 mg by mouth once as needed (Once daily in 24hr period PRN migraine) for up to 1 dose. 7 tablet 1   ??? lidocaine (LIDODERM) 5 % patch Place 1 patch on the skin in the morning. Apply to affected area for 12 hours only each day (then remove patch). 30 patch 0   ??? lisinopriL-hydrochlorothiazide (PRINZIDE,ZESTORETIC) 20-25 mg per tablet Take 1 tablet by mouth in the morning. 90 tablet 0   ??? MAGNESIUM ORAL Take by mouth daily. OTC     ??? naloxone (NARCAN) 4 mg nasal spray One spray in either nostril once for known/suspected opioid overdose. May repeat every 2-3 minutes in alternating nostril til EMS arrives 2 each 0   ??? nicotine (NICODERM CQ) 21 mg/24 hr patch Place 1 patch on the skin daily. 28 patch 2   ??? nicotine polacrilex (NICORETTE) 4 MG gum Apply 1 each (4 mg total) to cheek every hour as needed for  smoking cessation. 110 each 2   ??? ondansetron (ZOFRAN-ODT) 8 MG disintegrating tablet Take 1 tablet (8 mg total) by mouth every twelve (12) hours as needed for nausea. (Patient taking differently: Take 8 mg by mouth every twelve (12) hours as needed for nausea. Patient has not started taking yet (will take with chemo)) 30 tablet 1   ??? polyethylene glycol (MIRALAX) 17 gram/dose powder Take 17 g by mouth Three (3) times a day as needed (constipation). 850 g 3   ??? potassium chloride (KLOR-CON) 10 MEQ CR tablet Take 1 tablet (10 mEq total) by mouth Two (2) times a day. 180 tablet 0   ??? pregabalin (LYRICA) 200 MG capsule Take 1 capsule (200 mg total) by mouth Two (2) times a day. 60 capsule 2   ??? prochlorperazine (COMPAZINE) 10 MG tablet Take 1 tablet (10 mg total) by mouth every six (6) hours as needed for nausea. 30 tablet 6   ??? saliva stimulant comb. no.3 (BIOTENE MOISTURIZING MOUTH) Spry Take 1 spray by mouth every two (2) hours as needed (dry mouth). 44.3 mL prn   ??? senna (SENOKOT) 8.6 mg tablet Take 3 tablets by mouth daily.     ??? thiamine HCl (VITAMIN B-1 ORAL) Take by mouth daily.      ??? traZODone (DESYREL) 50 MG tablet Take 1.5 tablets (75 mg total) by mouth nightly. 45 tablet 1   ??? tucatinib (TUKYSA) 150 mg tablet Take 1 tablet (150 mg total) by mouth two (2) times a day . 60 tablet 1   ??? tucatinib (TUKYSA) 50 mg tablet Take 2 tablets (100 mg total) by mouth two (2) times a day . 120 tablet 3   ??? venlafaxine (EFFEXOR-XR) 150 MG 24 hr capsule Take 150 mg capsule in conjunction with 75 mg capsule for a total of 225 mg by mouth daily 30 capsule 2   ??? venlafaxine (EFFEXOR-XR) 75 MG 24 hr capsule Take 75 mg capsule in conjunction with 150 mg capsule by mouth daily for a total of 225 mg by mouth daily 30 capsule 2     No current facility-administered medications for this visit.

## 2021-06-16 NOTE — Unmapped (Signed)
[facility] Clinical Assessment & Refill Coordination Note    Brooke [name], DOB: [birth year]  Phone: [phone] (home)     All above HIPAA information was verified with patient's family member, sister Brooke [name].     Was a Nurse, learning disability used for this call? No    Specialty Medication(s):   Hematology/Oncology: Capecitabine 500mg , directions: 1000mg  2 times a day for 14 days on and 7 days off     Current Outpatient Medications   Medication Sig Dispense Refill   ??? albuterol HFA 90 mcg/actuation inhaler Inhale 2 puffs every six (6) hours as needed for wheezing.     ??? aluminum-magnesium hydroxide-simethicone 200-200-20 mg/5 mL Susp 80 mL, diphenhydrAMINE 12.5 mg/5 mL Liqd 200 mg, nystatin 100,000 unit/mL Susp 8,000,000 Units, distilled water Liqd 80 mL, lidocaine 2% viscous 2 % Soln 80 mL Take 5 mL by mouth every six (6) hours as needed. Swish, gargle, spit or swallow if throat issues 80 mL 2   ??? buprenorphine 10 mcg/hour PTWK transdermal patch Place 1 patch on the skin every seven (7) days for 28 days. Dose increase. Fill today 4 patch 0   ??? capecitabine (XELODA) 500 MG tablet Take 2 tablets (1,000 mg total) by mouth Two (2) times a day . For 14 days on and 7 days off 56 tablet 3   ??? clobetasoL (TEMOVATE) 0.05 % ointment Apply twice a day to rash on palm until smooth/clear. 30 g 1   ??? clonazePAM (KLONOPIN) 0.5 MG tablet Take 0.5 mg daily as needed for anxiety.  Do not exceed 0.5 mg/day. 30 tablet 0   ??? diclofenac sodium (VOLTAREN) 1 % gel Apply 2 g topically four (4) times a day as needed for arthritis or pain. 150 g 2   ??? diphenoxylate-atropine (LOMOTIL) 2.5-0.025 mg per tablet Take 1 tablet by mouth 4 (four) times a day as needed for diarrhea. 30 tablet 1   ??? dorzolamide (TRUSOPT) 2 % ophthalmic solution Administer 1 drop into the left eye Three (3) times a day. 10 mL 12   ??? empagliflozin (JARDIANCE) 10 mg tablet Take 1 tablet (10 mg total) by mouth in the morning. 30 tablet 11   ??? esomeprazole (NEXIUM) 40 MG capsule Take 1 capsule (40 mg total) by mouth daily. 90 capsule 3   ??? lasmiditan 50 mg Tab Take 50 mg by mouth once as needed (Once daily in 24hr period PRN migraine) for up to 1 dose. 7 tablet 1   ??? lidocaine (LIDODERM) 5 % patch Place 1 patch on the skin in the morning. Apply to affected area for 12 hours only each day (then remove patch). 30 patch 0   ??? lisinopriL-hydrochlorothiazide (PRINZIDE,ZESTORETIC) 20-25 mg per tablet Take 1 tablet by mouth in the morning. 90 tablet 0   ??? MAGNESIUM ORAL Take by mouth daily. OTC     ??? naloxone (NARCAN) 4 mg nasal spray One spray in either nostril once for known/suspected opioid overdose. May repeat every 2-3 minutes in alternating nostril til EMS arrives 2 each 0   ??? nicotine (NICODERM CQ) 21 mg/24 hr patch Place 1 patch on the skin daily. 28 patch 2   ??? nicotine polacrilex (NICORETTE) 4 MG gum Apply 1 each (4 mg total) to cheek every hour as needed for smoking cessation. 110 each 2   ??? ondansetron (ZOFRAN-ODT) 8 MG disintegrating tablet Take 1 tablet (8 mg total) by mouth every twelve (12) hours as needed for nausea. (Patient taking differently: Take  8 mg by mouth every twelve (12) hours as needed for nausea. Patient has not started taking yet (will take with chemo)) 30 tablet 1   ??? polyethylene glycol (MIRALAX) 17 gram/dose powder Take 17 g by mouth Three (3) times a day as needed (constipation). 850 g 3   ??? potassium chloride (KLOR-CON) 10 MEQ CR tablet Take 1 tablet (10 mEq total) by mouth Two (2) times a day. 180 tablet 0   ??? pregabalin (LYRICA) 200 MG capsule Take 1 capsule (200 mg total) by mouth Two (2) times a day. 60 capsule 2   ??? prochlorperazine (COMPAZINE) 10 MG tablet Take 1 tablet (10 mg total) by mouth every six (6) hours as needed for nausea. 30 tablet 6   ??? saliva stimulant comb. no.3 (BIOTENE MOISTURIZING MOUTH) Spry Take 1 spray by mouth every two (2) hours as needed (dry mouth). 44.3 mL prn   ??? senna (SENOKOT) 8.6 mg tablet Take 3 tablets by mouth daily.     ??? thiamine HCl (VITAMIN B-1 ORAL) Take by mouth daily.      ??? traZODone (DESYREL) 50 MG tablet Take 1.5 tablets (75 mg total) by mouth nightly. 45 tablet 1   ??? tucatinib (TUKYSA) 150 mg tablet Take 1 tablet (150 mg total) by mouth two (2) times a day . 60 tablet 1   ??? tucatinib (TUKYSA) 50 mg tablet Take 2 tablets (100 mg total) by mouth two (2) times a day . 120 tablet 3   ??? venlafaxine (EFFEXOR-XR) 150 MG 24 hr capsule Take 150 mg capsule in conjunction with 75 mg capsule for a total of 225 mg by mouth daily 30 capsule 2   ??? venlafaxine (EFFEXOR-XR) 75 MG 24 hr capsule Take 75 mg capsule in conjunction with 150 mg capsule by mouth daily for a total of 225 mg by mouth daily 30 capsule 2     No current facility-administered medications for this visit.        Changes to medications: [name] reports no changes at this time.    Allergies   Allergen Reactions   ??? Adhesive Rash   ??? Decadron [Dexamethasone] Anxiety     Mania as well   ??? Morphine Itching   ??? Tetracycline      Other reaction(s): Other (See Comments)   ??? Oxycodone      Night terrors   ??? Tegaderm Adhesive-No Drug-Allergy Check Rash       Changes to allergies: No    SPECIALTY MEDICATION ADHERENCE     Capecitabine 500 mg: 5-7 days of medicine on hand     Medication Adherence    Patient reported X missed doses in the last month: 0  Specialty Medication: capecitabine 500mg           Specialty medication(s) dose(s) confirmed: Regimen is correct and unchanged.     Are there any concerns with adherence? patient may have forgot to stop taking her capecitabine for the 7 days so provider is haveing her stop until 6/28 when she will resume and take for 7 days then stop for 7 days and start back on regualr cycle 7/12 (14 days on and 7 days off)    Adherence counseling provided? Yes: Not really sure how many she has or how many dose she has missed.  Her sister is going to try and get a better system for her to make sure she is taking the correct doses and not missing doses    CLINICAL MANAGEMENT AND INTERVENTION  Clinical Benefit Assessment:    Do you feel the medicine is effective or helping your condition? Yes    Clinical Benefit counseling provided? Not needed    Adverse Effects Assessment:    Are you experiencing any side effects? No    Are you experiencing difficulty administering your medicine? No    Quality of Life Assessment:    How many days over the past month did your condition/medication  keep you from your normal activities? For example, brushing your teeth or getting up in the morning. 0    Have you discussed this with your provider? Not needed    Acute Infection Status:    Acute infections noted within Epic:  No active infections  Patient reported infection: None    Therapy Appropriateness:    Is therapy appropriate? Yes, therapy is appropriate and should be continued    DISEASE/MEDICATION-SPECIFIC INFORMATION      N/A    PATIENT SPECIFIC NEEDS     - Does the patient have any physical, cognitive, or cultural barriers? No    - Is the patient high risk? Yes, patient is taking oral chemotherapy. Appropriateness of therapy as been assessed    - Does the patient require a Care Management Plan? No     - Does the patient require physician intervention or other additional services (i.e. nutrition, smoking cessation, social work)? No      SHIPPING     Specialty Medication(s) to be Shipped:   Hematology/Oncology: Capecitabine 500mg , directions: 1000mg  2 times a day for 14 days on and 7 days off    Other medication(s) to be shipped: No additional medications requested for fill at this time     Changes to insurance: No    Delivery Scheduled: Yes, Expected medication delivery date: 06/18/21.     Medication will be delivered via Same Day Courier to the confirmed prescription address in [facility]    The patient will receive a drug information handout for each medication shipped and additional FDA Medication Guides as required.  Verified that patient has previously received a Conservation officer, historic buildings and a Surveyor, mining.    The patient or caregiver noted above participated in the development of this care plan and knows that they can request review of or adjustments to the care plan at any time.      All of the patient's questions and concerns have been addressed.    Rollen Sox   [facility] Specialty Pharmacist

## 2021-06-16 NOTE — Unmapped (Signed)
[facility] Clinical Assessment & Refill Coordination Note    Brooke [name], DOB: Sep 03, 1956  Phone: [phone] (home)     All above HIPAA information was verified with patient's family member, sister [name].     Was a Nurse, learning disability used for this call? No    Specialty Medication(s):   Hematology/Oncology: Tukysa 50 and 150mg      Current Outpatient Medications   Medication Sig Dispense Refill   ??? albuterol HFA 90 mcg/actuation inhaler Inhale 2 puffs every six (6) hours as needed for wheezing.     ??? aluminum-magnesium hydroxide-simethicone 200-200-20 mg/5 mL Susp 80 mL, diphenhydrAMINE 12.5 mg/5 mL Liqd 200 mg, nystatin 100,000 unit/mL Susp 8,000,000 Units, distilled water Liqd 80 mL, lidocaine 2% viscous 2 % Soln 80 mL Take 5 mL by mouth every six (6) hours as needed. Swish, gargle, spit or swallow if throat issues 80 mL 2   ??? buprenorphine 10 mcg/hour PTWK transdermal patch Place 1 patch on the skin every seven (7) days for 28 days. Dose increase. Fill today 4 patch 0   ??? capecitabine (XELODA) 500 MG tablet Take 2 tablets (1,000 mg total) by mouth Two (2) times a day . For 14 days on and 7 days off 56 tablet 3   ??? clobetasoL (TEMOVATE) 0.05 % ointment Apply twice a day to rash on palm until smooth/clear. 30 g 1   ??? clonazePAM (KLONOPIN) 0.5 MG tablet Take 0.5 mg daily as needed for anxiety.  Do not exceed 0.5 mg/day. 30 tablet 0   ??? diclofenac sodium (VOLTAREN) 1 % gel Apply 2 g topically four (4) times a day as needed for arthritis or pain. 150 g 2   ??? diphenoxylate-atropine (LOMOTIL) 2.5-0.025 mg per tablet Take 1 tablet by mouth 4 (four) times a day as needed for diarrhea. 30 tablet 1   ??? dorzolamide (TRUSOPT) 2 % ophthalmic solution Administer 1 drop into the left eye Three (3) times a day. 10 mL 12   ??? empagliflozin (JARDIANCE) 10 mg tablet Take 1 tablet (10 mg total) by mouth in the morning. 30 tablet 11   ??? esomeprazole (NEXIUM) 40 MG capsule Take 1 capsule (40 mg total) by mouth daily. 90 capsule 3   ??? lasmiditan 50 mg Tab Take 50 mg by mouth once as needed (Once daily in 24hr period PRN migraine) for up to 1 dose. 7 tablet 1   ??? lidocaine (LIDODERM) 5 % patch Place 1 patch on the skin in the morning. Apply to affected area for 12 hours only each day (then remove patch). 30 patch 0   ??? lisinopriL-hydrochlorothiazide (PRINZIDE,ZESTORETIC) 20-25 mg per tablet Take 1 tablet by mouth in the morning. 90 tablet 0   ??? MAGNESIUM ORAL Take by mouth daily. OTC     ??? naloxone (NARCAN) 4 mg nasal spray One spray in either nostril once for known/suspected opioid overdose. May repeat every 2-3 minutes in alternating nostril til EMS arrives 2 each 0   ??? nicotine (NICODERM CQ) 21 mg/24 hr patch Place 1 patch on the skin daily. 28 patch 2   ??? nicotine polacrilex (NICORETTE) 4 MG gum Apply 1 each (4 mg total) to cheek every hour as needed for smoking cessation. 110 each 2   ??? ondansetron (ZOFRAN-ODT) 8 MG disintegrating tablet Take 1 tablet (8 mg total) by mouth every twelve (12) hours as needed for nausea. (Patient taking differently: Take 8 mg by mouth every twelve (12) hours as needed for nausea.  Patient has not started taking yet (will take with chemo)) 30 tablet 1   ??? polyethylene glycol (MIRALAX) 17 gram/dose powder Take 17 g by mouth Three (3) times a day as needed (constipation). 850 g 3   ??? potassium chloride (KLOR-CON) 10 MEQ CR tablet Take 1 tablet (10 mEq total) by mouth Two (2) times a day. 180 tablet 0   ??? pregabalin (LYRICA) 200 MG capsule Take 1 capsule (200 mg total) by mouth Two (2) times a day. 60 capsule 2   ??? prochlorperazine (COMPAZINE) 10 MG tablet Take 1 tablet (10 mg total) by mouth every six (6) hours as needed for nausea. 30 tablet 6   ??? saliva stimulant comb. no.3 (BIOTENE MOISTURIZING MOUTH) Spry Take 1 spray by mouth every two (2) hours as needed (dry mouth). 44.3 mL prn   ??? senna (SENOKOT) 8.6 mg tablet Take 3 tablets by mouth daily.     ??? thiamine HCl (VITAMIN B-1 ORAL) Take by mouth daily.      ??? traZODone (DESYREL) 50 MG tablet Take 1.5 tablets (75 mg total) by mouth nightly. 45 tablet 1   ??? tucatinib (TUKYSA) 150 mg tablet Take 1 tablet (150 mg total) by mouth two (2) times a day . 60 tablet 1   ??? tucatinib (TUKYSA) 50 mg tablet Take 2 tablets (100 mg total) by mouth two (2) times a day . 120 tablet 3   ??? venlafaxine (EFFEXOR-XR) 150 MG 24 hr capsule Take 150 mg capsule in conjunction with 75 mg capsule for a total of 225 mg by mouth daily 30 capsule 2   ??? venlafaxine (EFFEXOR-XR) 75 MG 24 hr capsule Take 75 mg capsule in conjunction with 150 mg capsule by mouth daily for a total of 225 mg by mouth daily 30 capsule 2     No current facility-administered medications for this visit.        Changes to medications: Nitasha reports no changes at this time.    Allergies   Allergen Reactions   ??? Adhesive Rash   ??? Decadron [Dexamethasone] Anxiety     Mania as well   ??? Morphine Itching   ??? Tetracycline      Other reaction(s): Other (See Comments)   ??? Oxycodone      Night terrors   ??? Tegaderm Adhesive-No Drug-Allergy Check Rash       Changes to allergies: No    SPECIALTY MEDICATION ADHERENCE     Tukysa 50 mg: 0 days of medicine on hand   Tukysa 150 mg: 5 days of medicine on hand     Medication Adherence    Patient reported X missed doses in the last month: 0  Specialty Medication: Tukysa 50 and 150mg           Specialty medication(s) dose(s) confirmed: Regimen is correct and unchanged.     Are there any concerns with adherence? Yes: patient may have taken 300mg  for awhile instead of the dose reduction to 250mg  and they are not sure how many she really has left    Adherence counseling provided? Yes: Not really sure how many she has or how many dose she has missed.  Her sister is going to try and get a better system for her to make sure she is taking the correct doses and not missing doses we talked about pill cases and alarms    CLINICAL MANAGEMENT AND INTERVENTION      Clinical Benefit Assessment:    Do you feel the medicine  is effective or helping your condition? Yes    Clinical Benefit counseling provided? Not needed    Adverse Effects Assessment:    Are you experiencing any side effects? No    Are you experiencing difficulty administering your medicine? No    Quality of Life Assessment:    How many days over the past month did your condition/medication  keep you from your normal activities? For example, brushing your teeth or getting up in the morning. 0    Have you discussed this with your provider? Not needed    Acute Infection Status:    Acute infections noted within Epic:  No active infections  Patient reported infection: None    Therapy Appropriateness:    Is therapy appropriate? Yes, therapy is appropriate and should be continued    DISEASE/MEDICATION-SPECIFIC INFORMATION      N/A    PATIENT SPECIFIC NEEDS     - Does the patient have any physical, cognitive, or cultural barriers? No    - Is the patient high risk? Yes, patient is taking oral chemotherapy. Appropriateness of therapy as been assessed    - Does the patient require a Care Management Plan? No     - Does the patient require physician intervention or other additional services (i.e. nutrition, smoking cessation, social work)? No      SHIPPING     Specialty Medication(s) to be Shipped:   Hematology/Oncology: [name] 50 and 150mg     Other medication(s) to be shipped: No additional medications requested for fill at this time     Changes to insurance: No    Delivery Scheduled: Yes, Expected medication delivery date: 06/18/21.     Medication will be delivered via Same Day Courier to the confirmed prescription address in [facility].    The patient will receive a drug information handout for each medication shipped and additional FDA Medication Guides as required.  Verified that patient has previously received a Conservation officer, historic buildings and a Surveyor, mining.    The patient or caregiver noted above participated in the development of this care plan and knows that they can request review of or adjustments to the care plan at any time.      All of the patient's questions and concerns have been addressed.    Rollen Sox   [facility] Specialty Pharmacist

## 2021-06-17 DIAGNOSIS — M2550 Pain in unspecified joint: Principal | ICD-10-CM

## 2021-06-17 MED ORDER — BUPRENORPHINE 15 MCG/HOUR WEEKLY TRANSDERMAL PATCH
MEDICATED_PATCH | TRANSDERMAL | 0 refills | 28 days | Status: CP
Start: 2021-06-17 — End: 2021-07-15

## 2021-06-17 NOTE — Unmapped (Signed)
Outpatient Oncology Social Work  Follow Up       General Psychosocial Needs and Case Management:  Rec'd update from Burtis Junes, FNP, that she refaxed the [facility] referral yesterday to [facility].  T/C to Avyana to coordinate setting up another evaluation for a home aide through [employer] with this SW'er present on the call to help advocate for her.  Conference T/C with Jon on the line to [employer] at [phone], and spoke with a representative. Rep confirmed receipt of updated PCS referral by fax from Dr. [name], NP yesterday.  [facility] Rep told Pt she will need to wait for a scheduler to call her to set up the phone evaluation.    Follow-Up Plan:  Pt has this SW'ers contact information and will reach out for psychosocial assistance as needed.     Claris Gladden, OSW-C  Oncology Outpatient Social Worker  Phone: [phone]

## 2021-06-17 NOTE — Unmapped (Signed)
Toes feel out of joint, right arm aches and upper back pain continues. Using the salon patches, varies in how much relief it provides. Movement makes the pain worse.    Worn down. No issues with constipation.  Thinks that the Butrans patch 10 mcg is helping a bit, but it has not been helping this past week.     Plan   Arthralgias   -will connect with team and will increase Butrans patch to a 10 mcg patch weekly to 15 mcg weekly.    Burtis Junes, FNP-BC, [facility]  Outpatient Oncology Palliative Care Service  [facility]  [address] [place], [place] [address]  [phone]

## 2021-06-18 MED FILL — CAPECITABINE 500 MG TABLET: ORAL | 21 days supply | Qty: 56 | Fill #1

## 2021-06-18 MED FILL — TUKYSA 50 MG TABLET: ORAL | 30 days supply | Qty: 120 | Fill #1

## 2021-06-18 NOTE — Unmapped (Signed)
[facility] 's Tukysa 150 mg shipment will be delayed as a result of the medication is too soon to refill until 06/19/21.     I have reached out to the patient  at [phone] [name] the patient's sister and requested point of contact) and communicated the delivery change. We will reschedule the medication for the delivery date that the patient agreed upon.  We have confirmed the delivery date as 06/21/21, via same day courier.

## 2021-06-21 DIAGNOSIS — M546 Pain in thoracic spine: Principal | ICD-10-CM

## 2021-06-21 DIAGNOSIS — G8929 Other chronic pain: Principal | ICD-10-CM

## 2021-06-21 MED ORDER — LIDOCAINE 5 % TOPICAL PATCH
MEDICATED_PATCH | TRANSDERMAL | 3 refills | 30 days | Status: CP
Start: 2021-06-21 — End: ?

## 2021-06-21 MED FILL — TUKYSA 150 MG TABLET: ORAL | 30 days supply | Qty: 60 | Fill #1

## 2021-06-25 NOTE — Unmapped (Signed)
Outpatient Oncology Social Work  Follow Up       [employer] [facility] referral - Follow-Up:  T/C to [place]. She states she has not heard from [facility] to schedule her PCS assessment.  Conference T/C with Pt on the line to [employer] at [phone], and spoke with a representative.   Rep confirmed receipt of updated PCS referral by fax from Dr. [name], NP on 06/16/21.  [facility] Rep told Pt that scheduler called Pt on 06/23/21, and left VM msg. However, Brooke [name] states that Scheduler called the wrong phone number.  [employer] Rep updated Pt's phone in their system.  Rep transferred Pt and this SW'er to Brooke [name]'s VM and Pt left detail;ed VM msg requesting to schedule an evaluation, and reiterated the correct phone number,  Brooke [name] also reiterated ito Brooke [name] that she wants this SW'er to be present for the evaluation.    Follow-Up Plan:  Pt has this SW'ers contact information and will reach out for psychosocial assistance as needed.     Brooke [name], OSW-C  Oncology Outpatient Social Worker  Phone: [phone]

## 2021-06-25 NOTE — Unmapped (Signed)
Pharmacist Phone Follow-Up    Cancer Team  Medical Oncology: Dr. [name]  Reason for call: Oral chemotherapy management  Current treatment: Trastuzumab + tucatininb + capecitabine     Breast cancer:  Brooke [name] is a 65 yo woman with HER2+ metastatic breast cancer who started trastuzumab, tucatinib, and capecitabine on 4/18.  Tucatinib and capecitabine have been dose reduced to decrease diarrhea and improve overall tolerance.  Her current dose of capecitabine is 1000 mg BID x 14 days on and 7 days off.  Capecitabine cycles are to start on day 1 with her trastuzumab infusions.  Tucatinb dose is 250 mg BID, ongoing.    I called Brooke [name] just to confirm that she has been taking capecitabine this week.  She stated that she has been taking capecitabine twice daily and understands that she will stop the medication after 06/28/21.  She will resume capecitabine on 07/06/21 when she has her next trastuzumab infusion.      I let her know that I would contact her and her sister next week to review a different dosing regimen of capecitabine to make it easier for her.    Oral chemotherapy regimen:??   Starting 5/30 doses reduced to:   Tucatinib 250 mg BID (started on 6/5) --> she took 150 mg po BID (5/30-6/4) then increased on 6/5 to 250 mg after receiving 50 mg tabs from the pharmacy  ????????????????????????Capecitabine 1000 mg BID x 14 days then 7 days off  ????????????????????????Trastuzumab IV q 3weeks     Started 04/12/21:  ????????????????????????Tucatinib 300 mg po BID continuous  ????????????????????????Capecitabine 1500 mg BID x 14 days then 7 days off (stopped during first cycle d/t diarrhea)  ????????????????????????Trastuzumab IV q 3weeks  Start date: 04/12/21  Pharmacy: [facility]     Patient verbalized understanding.  Approximate time spent on phone with patient: 15 minutes    Breast Oncology    Breast Oncology Metrics:         Chemotherapy Dose: Dose documented     Chemotherapy Schedule: Schedule documented     NCI CTCAE: Diarrhea - Grade 1  Comments: Takes Lomotil >3 stools, works very well.  Diarrhea is intermittent     Interventions: Education provided

## 2021-06-30 ENCOUNTER — Telehealth: Admit: 2021-06-30 | Discharge: 2021-07-01 | Payer: MEDICARE

## 2021-06-30 DIAGNOSIS — Z515 Encounter for palliative care: Principal | ICD-10-CM

## 2021-06-30 DIAGNOSIS — M549 Dorsalgia, unspecified: Principal | ICD-10-CM

## 2021-06-30 DIAGNOSIS — G629 Polyneuropathy, unspecified: Principal | ICD-10-CM

## 2021-06-30 DIAGNOSIS — G620 Drug-induced polyneuropathy: Principal | ICD-10-CM

## 2021-06-30 DIAGNOSIS — T451X5A Adverse effect of antineoplastic and immunosuppressive drugs, initial encounter: Principal | ICD-10-CM

## 2021-06-30 DIAGNOSIS — G8929 Other chronic pain: Principal | ICD-10-CM

## 2021-06-30 MED ORDER — PREGABALIN 200 MG CAPSULE
ORAL_CAPSULE | Freq: Two times a day (BID) | ORAL | 2 refills | 30 days | Status: CP
Start: 2021-06-30 — End: ?

## 2021-06-30 MED ORDER — BUPRENORPHINE 2 MG-NALOXONE 0.5 MG SUBLINGUAL FILM
ORAL_FILM | 0 refills | 0 days | Status: CP
Start: 2021-06-30 — End: ?

## 2021-06-30 NOTE — Unmapped (Signed)
[facility]  -----------------------------------------------------------------------------------------------------  Follow-up Outpatient Evaluation    PCP: [name], MD     Consulting Physicians: Surgical oncology: Tyson Alias M.D.     ID: Metastatic breast cancer, ER-, PR+, HER-2 positive to lung, brain.  First-line and second line: Trastuzumab + AI -->Tamoxifen d/t arthralgias.  3rd line:  TDM1.  4th line: capecitabine (xeloda), tucatinib, herceptin    Reason for Visit: Here for management of breast cancer.  -----------------------------------------------------------------------------------------------------  I personally spent 40 minutes face-to-face and non-face-to-face in the care of this patient, which includes all pre, intra, and post visit time on the date of service.  -----------------------------------------------------------------------------------------------------  Assessment:  Brooke [name] is a 65 y.o. 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     Brooke [name] presents today for follow up on Xeloda, tucatinib + Herceptin, which she started on 4/18. In the interim, her capecitabine (xeloda) was held 05/12/21 d/t severe diarrhea. She improved with lomotil and imodium. Capecitabine was restarted at 1000 mg BID 05/20/21 with DR tucatinib to 250 mg daily. On 07/02/21, due to issues with adherence (she did not take a week off), the capecitabine dose was changed to metronomic dosing. Her capecitabine dose was changed to 500 mg po BID on a continuous schedule. The new dosing will start today 07/06/21. She is to continue tucatinib 250 mg BID. Apparently she has been having brain fog/ memory loss. She was recently switch to Suboxone (7/6) which may be a contributor (will d/w palliative care) however given her risk of recurrent brain mets I will order an MRI brain for further work-up and refer her back to Dr. Flonnie Hailstone for evaluation. She has been having mild-mod nausea (from tucatinib). I prescribed her [facility] and refilled compazine. She does not like disintegrating Zofran tablet and switched to regular. I will have our pharmacy team f/u for management. She has been having mild constipation for which I recommended she continue with senna and try prune juices. We discussed getting restaging CT C/A/P and NM bone scans for next visit. We discussed getting repeat labs today (last results 05/03/21). Plan to continue with Xeloda + tucatinib + Herceptin. Patient is understanding and amenable to plan. She will RTC in 1 month for provider visit + scan reviews. She will reach out in the interim if she has any questions or concerns.     1. Metastatic breast cancer HR + HER-2 overexpressing   A. Systemic therapy    First-line: THP. Taxol d/c for G3 neuropathy.  Pertuzumab d/c for G3 diarrhea.  Not progression.   Second line: Trastuzumab + AI -->Tamoxifen 2/2 arthralgias.   Third line: Rebiopsy ER -/PR 10%/HER-2 +ve. C1 TDM1 03/23/20 limited by neuropathy. 09/01/20: DR TDM1 to 2.4 mg/kg. Discontinue TDM1 d/t abnormal LFT's and not progression.   Fourth line:  Herceptin + tucatinib + capecitabine (Xeloda). 05/20/21 DR capecitabine to 1000 mg BID, and tucatinib to 250 mg daily d/t diarrhea. D/t issues with adherence, her capecitabine dose was changed to 500 mg po BID on a continuous schedule 07/06/21.  Subsequent lines:  Consider return to TDM1 or Enhertu on progression predicated on LFTs.     B.  Systemic imaging  -- 12/08/20: BS: No bone metastases. 01/28/21 CT AP: SD in the chest. Hepatic steatosis w/ ill defined area in the left hepatic lobe. -- [place] 02/23/21 which was unremarkable for nodular  regenerative hyperplasia.  MRI abdomen pending 03/22/21. The area in question on the prior CT and ultrasound likely represents a focal area of hepatic steatosis. Otherwise, no suspicious hepatic lesion is identified. Known left lower lobe pulmonary mass measures at least 4 cm. Other pulmonary nodules are not well delineated by MRI. Multiple tiny cystic lesions are present in the pancreas. 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 The main pancreatic duct is non-dilated. No evidence of biliary obstruction.     C.  Brain mets/neuroimaging   Surgery: 01/15/19: resection of the right frontal met. [+++] MBC.  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   03/10/21: MRI brain: SD.    D.  Molecular  Genomics:  STRATA: ERBB2, PIK3CA.  Not eligible for HARMONY.  Genetic: Referral submitted in 03/2020, patient was never reached. Will f/u STAT DNA lab testing and Econsultation in next visit.   Tumor Markers: Checked today.    E. Radiation:   09/05/17: CK to 7 brain lesions. 09/13/18: CK (1#) right 9 mm lesion. 2/28 - 02/28/2019: CK 25 Gy in 5# to left frontal resection bed.     F. 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 Barbette Merino who thought not likely consequential but recommended CV cards eval.   > F/u with Dr. Harland German.   > Echo 12/30/20: EF 60-65%.     2.  Comorbidities/supportive care   > Dysphagia/GERD/hemoglobin drop: Continue Nexium/famotidine.  TTG positive.  Given stable Hgb will hold on endoscopy for now.  F/u with Dr. Randye Lobo.  Gluten-free diet.   > Depression/Anxiety: Venlafaxine 225 mg. C/w clonazepam. F/u psychiatry/[facility] (Dr. Vertell Limber).    > Peripheral neuropathy: Venlafaxine 225 mg. C/w Lyrica dose 200 mg BID.  Paraneoplastic panels unremarkable. Dose reduced TDM1.  Did not tolerate methadone due to sedation/dizziness. Follows with palliative care. Can refer back to neurology to consider TENS versus scrambler therapy.   > Smoking cessation: Previously encouraged to stop smoking. 1 ppd currently. Previously prescribed Chantix.   > Insomnia: Continue trazodone 75mg .  No longer taking Remeron.   > Arthralgias: C-spine x-ray showed DJD.  Muscle relaxant by PCP. Referred to PT for back previously. Currently receiving neck PT.  If no improvement refer back to Dr. Glenice Laine. Consider PCI. Nondisplaced right hip fracture: Has opiates for mgmt. Discussed possible scrambler therapy. Recommended Tramadol.   > Wheezing:  Previous recommendations made to her to reschedule pulm appointment; recommended by PCP for COPD eval, and to again  consider reducing smoking.  Following with pulmonary, Dr. Wellington Hampshire, MD. NM lung scan 03/2021 WNL. Thought to be cardiology related.    > Cirrhosis. Abnormal LFTs. Follows with GI, Dr. Raford Pitcher. No more alcohol. Will recheck LFTs today.    > Iron deficiency/fatigue: Calculated iron deficit 1.5 g.  IV iron 12/11/20 and 01/19/21. Thalassemia testing 12/29/2020 does not show hereditary link.    >Scapulothoracic bursitis: Consider steroid injections. Referred to Dr. Glenice Laine.    >Health Maintenance: COVID-19 vaccinated. Had booster.    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 Applying foot and heel lotion.   > Constipation: Continue taking senna. Recommended prune juice.   > Memory loss/brain fog: Ordered MRI brain.    3.  Follow-up   -- Get MRI brain, CT C/A/P, NM bone scan  -- Continue Capecitabine + Tucatinib + IV Trastuzumab   --  RTC in 1 month for provider visit + scan review  -- Rx- Zofran, Senna  -----------------------------------------------------------------------------------------------------  Interval history:  Brooke [name] is a 65 y.o. F who presents today accompanied by her sister, Misty Stanley, for follow up on Xeloda, Tucatinib, Herceptin.   -- Nausea. Intermittent if she has dysphagia with oral medications. Denies vomiting.   -- Normal Appetite  -- Moderate fatigue.   -- Foggy brain/ memory loss  -- Constipation mild. Taking senna  -- HSF mild. -- Denies arthralgias  -- No new breast related complaints  -- Full 12 ROS reviewed and otherwise mild/none.    Social history update: Sister Lisa's granddaughter recently passed away and her grandson was diagnosed with brain cancer. Has a boyfriend, [name], She has a son, [name]. She has another sister, [name], who recently had a new great-grandchild.     Review of Systems: A complete twelve systems review was obtained and is positive per the HPI but otherwise negative or unchanged. See MIMS #1170 where available.    ONC HISTORY  Metastatic HR + HER-2 overexpressing breast cancer to lung and brain.  First-line and second line: Trastuzumab + AI -->Tamoxifen d/t arthralgias.  3rd line:  TDM1.  4th line: capecitabine (xeloda), tucatinib, herceptin    Consider [id number], Enhertu on progression.     Radiation: 09/05/17: CK to 7 brain lesions. 09/13/18: CK (1#) right 9 mm lesion. 2/28 - 02/28/2019: CK 25 Gy in 5# to left frontal resection bed.  Surgery: 01/15/19: resection of the right frontal met.  Genomic: STRATA: ERBB2, PIK3CA.  Not eligible for HARMONY.    2017 or earlier:  R breast wound and nipple inversion which patient describes herself as ignoring for some time    02/04/16:  Mammogram R breast showing 3.1 cm irregular spiculated dense mass in lateral Rt breast w nipple and skin retraction. 2 subcentimeter irregular masses in the RUOQ  (10', 11') (possible satellite lesions). Large Rt axillary lymph node 1.7. Lt breast clear. BIRAD 5.    02/08/16:  Core biopsy R axillary LN (breast bx deferred due to open wound) Gr 3, IDC with apocrine features. ER (-), PR (31-40), HER-2 (3+). Noted to have multiple skin lesions.    02/10/16:  Initial staging with CT CAP: Large, necrotic right breast mass with wide open tract to the skin, consistent with known right breast malignancy.  Numerous enlarged right axillary, subpectoral, mediastinal, bilateral hilar lymph nodes and innumerable bilateral pulmonary nodules.  Bone scan negative for osseous mets.    --Started trastuzumab initially with THP.  Taxol d/c after three cycles for G3 neuropathy.  Pertuzumab d/c for G3 diarrhea.  Not discontinued due to progression.    04/28/16:  Switched to anastrozole/trastuzumab  --patient had trouble tolerating AIs, switched to exemestane    07/13/16: genomic testing with STRATA showing ERBB2 copy # alteration, PIK3CA p.[id number]    08/01/17:  MRI showed multiple small brain metastases   --completed CK to 7 lesions on 09/05/17.   --CK to the right 9 mm lesion (1 fx on 09/13/18).    --continued on AI/trastuzumab    11/2018:  progression of her frontal lobe lesion.   --resection of the right frontal mass by Dr. Tresa Garter on 01/15/2019.  Pathology was consistent with metastatic breast cancer.  --2/28 - 02/28/2019: CK 25 Gy in 5# to left frontal resection bed     04/2019: switched to tamoxifen/trastuzmab due to intolerance of AIs (joint pain    09/2019:  Concern for possible slight extra-cranial progression in  LLL lung mass, elected to continue on current tx    01/03/20:  Repeat CT with new lingular 1.9 cm nodular opacity; differential diagnosis includes new metastatic lesion and consolidative/infectious opacity. CT chest 1 month is recommended to help discriminate between these two etiologies.  Also slight increase R breast mass.    02/03/20:  Repeat CT again with lingular 1.9 cm nodule unchanged to slightly increased in size, left lower lobe 3.5 cm mass slightly increased in size. Discussed at [facility] due to concern that lingular mass might represent lung primary in current smoker. Recommended biopsy.      01/2020:  Changed to fulvestrant/trastuzumab due to progression    03/05/20:  FNA lung biopsy showing carcinoma c/w breast primary (GATA3+, TTF1-), ER neg/PR 10%/HER2 3+.  Switched to [employer]. C1D1 03/23/20.    03/29/21: Switched to capecitabine (xeloda), tucatinib, herceptin d/t abnormal LFT's.      Onc Family History:   - Father: prostate  - Mat grandfather: prostate  - Mat uncle: lung  - Pat aunt: breast   - Great niece: adrenal     Social History:   Social History     Social History Narrative    She lives in senior housing in [place] x2 months. he has 4 sisters, 2 nearby and involved in care. She has 1 son-[name], recently married 8 yo, who lives about an hour and a half away in [place]. He works full-time as a Sales executive. She is still a chronic smoker (1ppd). She is currently divorced.         Goes to [employer] in [place] and gets a lot of support. Her church friend [name] and her sisters goes with her to chemo appts.     Physical Examination:   Vital Signs: LMP  (LMP Unknown)  See flow sheet  General: Chronically ill appearing female in no acute distress.    HEENT: Well-healed (R) frontal scalp scar. EOMI. Sclerae anicteric.   Neck: Supple. No cervical or supraclavicular adenopathy.   Pulm: Lungs clear to auscultation bilat; breathing non-labored.  Cardiac: Regular rate and rhythm; no LE edema.    Abdominal: Mild fullness to RUQ, over liver.   Musculoskeletal: Tenderness to b/l lower scapula.    Breasts/chest: (R) breast: Nipple autolyzed. Small ulcer laterally to redeveloping nipple closed. (L) breast exam: Port to the upper left chest.    Neurologic:  Alert and oriented. Grossly non-focal  Lymphatic: No cervical or supraclavicular adenopathy. No palpable right axillary adenopathy.  Skin: Normal.   Extremity: Callus on right foot.     DATA REVIEW:    Pertinent labs/imaging/pathology reviewed in detail.    Scribe Statement: Documentation assistance was provided by me personally, Marcy Salvo, a scribe. Olivia Mackie, MD obtained and performed the history, physical exam and medical decision making elements that were entered into the chart. Signed by Marcy Salvo, 07/06/21 at 8:43 AM    Provider Attestation: Documentation assistance was provided by the Scribe, Marcy Salvo. I was present during the time the encounter was recorded. The information recorded by the Scribe was done at my direction and has been reviewed and validated by me. Signed by Olivia Mackie, MD 07/06/21 at 9:10 AM

## 2021-06-30 NOTE — Unmapped (Addendum)
PA submitted for buprenorphine-naloxone 2-0.5 mg films #120/30 days    Insurance information: [employer] Medicare Part D 1 [id number]  ??  BIN: [id number]   PCN: [id number]   Grp:  [id number]  ID: [id number]   ??  ??Was unable to submit PA through Cover My Meds; submitted verbally over the phone.    APPROVED thru 12/25/2021.  Pt and pharm notified

## 2021-06-30 NOTE — Unmapped (Signed)
OUTPATIENT ONCOLOGY PALLIATIVE CARE    Principal Diagnosis: Brooke [name] is a 65 y.o. female with metastatic breast cancer,  diagnosed in 2017.  Disease sites include lung and brain.     Assessment/Plan:   1.  Painful neuropathic hands and feet-worsening and continued mid upper back pain. Hx of cervical thoracic paraspinal trigger point bilateral this past Feb.     -Continue lyrica 200 mg bid dosing.  -Receptive to starting Suboxone and will stop Butrans patch.    -Day 1 Suboxone 2 mg twice daily   -Day 2 Suboxone 2 mg 3 times daily dosing  -Day 3 Suboxone 4 mg twice a day.  -Sent for a 2 wk supply.   -Will call to see effect and adjust accordingly.  -Continue Effexor 225 mg every day  -Continue diclofenac gel as needed  -Not able to tolerate NSAIDs (due to platelets) and tylenol (recent elevated liver enzymes)  -Seeing Dr. Glenice Laine spine team tomorrow for hopefully injections which have helped in the past.    Opioid use in past:  -Had difficulty tolerating the methadone 2.5 mg dosing due to sedation.  Did not try the 1 mg methadone, so could consider this in the future if needed for the neuropathic discomfort.  Some reluctance in re-starting methadone.  -No relief with tramadol  -oxycodone-night terrors.    2.  Support-  -Brooke [name] social worker has been helpful in trying to get more home support and coping at this get started soon.  -Wants to throw away her unused medicines and shared that when she comes to see Dr. Kerry Hough next Tuesday the 12th to bring in her medications and will reach out to [place] or Aimee to see if they could help with this.  -Questioning whether or not it safe to have sun exposure with her current breast cancer medication-will connect with Aimee.      3.  Goals of care-not addressed today.    At prior visits: goals which are to decrease pain and to maintain her independence. Lives alone and Brooke [name] lives close by.     Wants to ensure that her quality of life remains high and the wish to keep on going.  Shared concern that she does not want to be in pain.  NP provided reassurance that our team can offer her different treatment strategies to help her with her goal.     Advance care planning-see advance care planning note dated 01/31/2020.  -at prior visits: Patient shared that she has been dreaming about death. She does have her funeral arrangements completed. Shared that she has her wishes written down and her Sister Brooke [name] knows where they are regarding her funeral. She still contemplating whether to be cremated or buried and she is leaning toward cremation. patient currently focused on cancer directed therapy.  Prefers to stay in the moment and not discussed things.  We will continue to support and address if she has a change in clinical condition.        -Advance directives scanned in on 11/02/19        HCDM [facility]): Brooke [name] - Sister - [phone]    HCDM, [facility] Altern[facility] [name] - Sister - [phone]    At prior visits,   # Controlled substances risk management.  We are not currently prescribing controlled medications for her.   - Patient has a signed pain medication agreement with Outpt Palliative care, completed on 11/01/18, as per standard care. This was signed again on 01/28/19   - [place]  database was reviewed today and it was appropriate.   - Urine drug screen was not performed at this visit. Findings: not applicable.   - Patient has received information about safe storage and administration of medications.   - Patient has received a prescription for narcan and shared with Brooke [name] and pt.       F/u: 8/10 phone    ----------------------------------------  Referring Provider: From inpatient oncology team  Oncology Team: Breast team-Dr. [name]  PCP: [name], MD      HPI: 65 year old woman with her to overexpressing metastatic breast cancer to her lung and brain.  Was found to have multiple small brain metastases and completed CyberKnife therapy on September 11. Describes ongoing pain in her pelvis, rates it as a 3 out of 10 today.  Has been improving over the last week or 2.  Feels like gabapentin has been helpful, and is also responded well to Tylenol and ibuprofen in the past.  She is taken oxycodone and it gave her night terrors does not want to take it again.  Has taken Dilaudid previously and did not have side effects from this.    Current cancer-directed therapy: capecitabine (Xeloda), tucatinib, and trastuzumab (Herceptin).       Interval hx 06/30/21 [facility] and Truda    -More engaged with boyfriend-Brooke [name] and spending more time with him. Went on a motorcycle ride this past w/e.  -Has enjoyed spending more time with Brooke [name] (he isn't spending as much time playing golf). He has been helping with cooking and her medications.   -Sleeping better. It has been a good 2 wks.  -Planning to go to beach at the end of Aug.   -NP inquired as if Brooke [name] drinks alcohol and [name] confirmed that he does but it is socially.  Shares that she does drink with him, but limits it to 1 glass and does not use the alcohol with the opioids.  -Acknowledged that its been good to have Brooke [name] be with her because outside of Brooke [name] her sister really nobody else is visiting.    -No diarrhea until this am. Didn't have diarrhea x 2 wks. Feels like she isn't going to have any more. Xeloda last dose was   -Takes antinausea med prior [place] and this helps.     -Cleaned out her medicine cabinet-it is more organized now.   -Not using the pill box-hard to open and reading pill bottles. Reading the bottles prior to taking.     -Pain spike yesterday-upper back-mid back towards right side and attributes this to moving more yesterday.    -Neuropathy has changed-worsening.  Has intense toes and feet sensations.  Feels like there is pain shooting out from her toes and at the top of her feet are swollen, but they are not.  Hands/fingertips losing some sensation, but better today.   -Hasn't used butrans patch since last Friday.  Did not feel that the Butrans patch even at the 15 mcg per dosing was effective.  Has not taken any opioids since the Butrans patch last Friday.  -Rare headache and right ear pain        Palliative Performance Scale: 80% - Ambulation: Full / Normal Activity with effort, some evidence of disease / Self-Care:Full / Intake: Normal or reduced / Level of Conscious: Full         Coping/Support Issues: Patient reports she is been able to attend church functions and is finding this very helpful. Has boyfriend Brooke [name] for the last 15 years.  At prior visits, patient did share that she continues to receive much help from her church friend, Brooke [name] and her husband.  They prayed together and she is found this supportive.  She states that her 2 sisters have been supportive, but she feels that they are becoming more less patient with her due to patient's irritability.     Overall coping well, no specific issues identified.  Finding support with her church community, her sister, Brooke [name], and prayer.         Social History: Pt lives in senior housing in [place] month, says it's ok and quiet but she is from [place] and liked it there more. She has 4 sisters, 2 nearby and involved in care. She has 1 son-Brooke [name], 35yo, who lives about an hour and a half away in [place]. She is divorced.  ??  Goes to [employer] in [place] and gets a lot of support. Her church friend Brooke [name] goes with her to chemo appts. Her sisters will go too.    Advance Care Planning: Advance directives scanned into system  HCPOA: See ACP note  Living Will: See ACP note  ACP note: Yes, advance directive scanned in on November 6    Objective     Opioid Risk Tool:      Opioid Risk Tool:   Female  Female    Family history of substance abuse      Alcohol   1  3    Illegal drugs  2  3    Rx drugs  4  4    Personal history of substance abuse      Alcohol  3  3    Illegal drugs  4  4    Rx drugs  5  5    Age between 16--45 years  1  1    History of preadolescent sexual abuse  3 0 Psychological disease      ADD, OCD, bipolar, schizophrenia  2  2    Depression  1  1    Total: 7  (<3 low risk, 4-7 moderate risk, >8 high risk)      Oncology History Overview Note   Identifying Statement:  Madasyn Heath is a 65 y.o. female diagnosed with a right posterior frontal lobe metastasis (breast primary) status post resection 01/15/2019 and 5/5 fractions of SBRT (2500 Gy via CyberKnife) to the resection cavity.    Treatment History:  09/05/17: S/p SRS to 7 lesions  01/15/19: S/p resection, followed by SRS 2500 cGy   03/04/19: KPS 80; MRI with postsurgical changes versus residual disease; RTC in 2 weeks w/ MRI   04/15/19: KPS NA; MRI w/ SD; no study; RTC 6 weeks w/ MRI   06/10/19: KPS 80; MRI w/ SD; RTC 4 weeks  07/15/19: KPS 80; MRI w/ SD; RTC 8 weeks  08/19/19: KPS 80; start nortriptyline for HAs; RTC 4 weeks w/ MRI  09/16/19: KPS 80; con't nortriptyline for HAs; RTC 2 mos w/ MRI  11/11/19: KPS 80; MRI w/ SD though w/ small infarct; proceed w/ CVA workup; start 81mg  ASA; encouraged smoking cessation; con't nortriptyline; RTC to discuss results  11/25/19: KPS 80; discussed CVA workup results; MRA unremarkable; con't smoking cessation efforts; con't nortriptyline for HAs; con't 81mg  ASA, start Lipitor; f/u with PCP for COPD eval; RTC 2 mos w/ MRI     Malignant neoplasm of overlapping sites of right female breast (CMS-HCC)   2017 -  Presenting Symptoms    Large open  wound RT breast which began as nipple inversion. Patient states that she ignored for a long time.  Physical exam of the area of concern in the RTbreast demonstrates a large open wound in the lateral right breast. Saw PCP 01/27/16 Rx Keflex.     02/04/2016 Interval Scan(s)    MMG/US: 3.1 (3.0) cm irregular spiculated dense mass in lateral Rt breast w nipple and skin retraction. 2 subcentimeter irregular masses in the RUOQ  (10', 11') (possible satellite lesions). Large Rt axillary lymph node 1.7. Lt breast clear. BIRAD 5.     02/08/2016 Biopsy    US guided Rt. Axillary LN biopsy:  Gr 3, IDC with apocrine features. ER (-), PR (31-40), HER-2 (3+). Noted to have multiple skin lesions. Poor access to breast mass due open 10-15 cm wound risk of pain and bleeding.     02/09/2016 Initial Diagnosis    Malignant neoplasm of overlapping sites of right female breast (RAF-HCC)     02/10/2016 Interval Scan(s)    CT CAP: Large, necrotic right breast mass with wide open tract to the skin, consistent with known right breast malignancy.  Numerous enlarged right axillary, subpectoral, mediastinal, bilateral hilar lymph nodes and innumerable bilateral pulmonary nodules     02/10/2016 Interval Scan(s)    NM Bone scan: No osseous metastatic disease.     04/28/2016 - 03/10/2020 Chemotherapy    OP TRASTUZUMAB (EVERY 21 DAYS)  Trastuzumab 8 mg/kg loading then 6 mg/kg every 21 days     07/13/2016 Genetics    STRATA  ??? ERBB2 copy number alteration  Estimated copy number: 40, confidence interval: 35.7 - 44.0, cellularity: 50%  Associated FDA-approved targeted therapies in breast cancer: trastuzumab, lapatinib, ado-trastuzumab emtansine, pertuzumab  ??? PIK3CA p.[id number]     01/03/2020 -  Cancer Staged    CT CAP 01/03/2020 which showed a new lingular opacity measuring 1.9 cm in the right lung. There was also slight increase in the right breast mass from 1.2-> 1.5 cm.  Bone scan shows no osseous metastases. Overall I considered these findings indeterminate and ppted to repeat CT CAP in 1 month     02/03/2020 -  Cancer Staged    CT chest 02/03/20 which showed that the lingular lesion has persisted and increased in size.  No mediastinal adenopathy reported.  She continues to report a nonproductive cough and mild dyspnea.  She is a chronic smoker and currently smokes on average half a pack per day.  My major concern is that this could be a second primary lung cancer as her other sites of diseases stable on current systemic therapy       02/03/2020 Endocrine/Hormone Therapy    Stop Tamoxifen, Add Fasoldex, continue Q3week Hercpetin     03/23/2020 - 02/09/2021 Chemotherapy    OP BREAST ADO-TRASTUZUMAB EMTANSINE  ado-trastuzumab emtansine 3.6 mg/kg IV on day 1, every 21 days     04/12/2021 -  Chemotherapy    Tucatinib + Capecitabine  OP TRASTUZUMAB (EVERY 21 DAYS)  trastuzumab 8 mg/kg IV LOADING, then 6 mg/kg IV MAINT, every 21 days     Brain metastases (CMS-HCC)   08/07/2017 Initial Diagnosis    Brain metastases (CMS-HCC)    Summary of Radiosurgery   Rx:7 brain met: 09/05/2017: 2,000/2,000 cGy  Sec:R Frontal: 09/05/2017: 2,000 cGy  Sec:L Post Fr: 09/05/2017: 2,000 cGy  Sec:L Ant Fro: 09/05/2017: 2,000 cGy  Sec:R Sup Oc: 09/05/2017: 2,000 cGy  Sec:L [place]: 09/05/2017: 2,000 cGy  Sec:R Inf Occi: 09/05/2017: 2,000 cGy  Sec:L  Tempor: 09/05/2017: 2,000 cGy  Rx:Lt Med Oc: 09/13/2018: 2,000/2,000 cGy  Rx:Rt Frontal: : 2,500/2,500 cGy    01/15/2019: Craniotomy and resection of right posterior frontal lobe metastasis. Followed by [facility]    03/04/19: KPS 80; MRI with postsurgical changes versus residual disease; RTC in 2 weeks w/ MRI     01/03/2020 -  Cancer Staged    CT CAP 01/03/2020 which showed a new lingular opacity measuring 1.9 cm in the right lung. There was also slight increase in the right breast mass from 1.2-> 1.5 cm.  Bone scan shows no osseous metastases. Overall I considered these findings indeterminate and ppted to repeat CT CAP in 1 month     02/03/2020 -  Cancer Staged    CT chest 02/03/20 which showed that the lingular lesion has persisted and increased in size.  No mediastinal adenopathy reported.  She continues to report a nonproductive cough and mild dyspnea.  She is a chronic smoker and currently smokes on average half a pack per day.  My major concern is that this could be a second primary lung cancer as her other sites of diseases stable on current systemic therapy       02/03/2020 Endocrine/Hormone Therapy    Stop Tamoxifen, Add Fasoldex, continue Q3week Hercpetin     Metastatic breast cancer (CMS-HCC)   01/25/2019 Initial Diagnosis Metastatic breast cancer (CMS-HCC)     03/23/2020 - 02/09/2021 Chemotherapy    OP BREAST ADO-TRASTUZUMAB EMTANSINE  ado-trastuzumab emtansine 3.6 mg/kg IV on day 1, every 21 days     04/12/2021 -  Chemotherapy    Tucatinib + Capecitabine  OP TRASTUZUMAB (EVERY 21 DAYS)  trastuzumab 8 mg/kg IV LOADING, then 6 mg/kg IV MAINT, every 21 days         Patient Active Problem List   Diagnosis   ??? Malignant neoplasm of overlapping sites of right female breast (CMS-HCC)   ??? Peripheral neuropathy   ??? Insomnia   ??? Brain metastases (CMS-HCC)   ??? Nausea   ??? Closed fracture of one rib with routine healing   ??? Diarrhea of presumed infectious origin   ??? Failure to thrive in adult   ??? Tobacco use   ??? Chronic diarrhea   ??? Depression   ??? Hyponatremia   ??? Back pain   ??? Closed fracture of multiple pubic rami, right, initial encounter (CMS-HCC)   ??? Macrocytosis   ??? Pelvic fracture (CMS-HCC)   ??? Metastatic breast cancer (CMS-HCC)   ??? Migraine without aura and without status migrainosus, not intractable   ??? Essential hypertension   ??? CVA (cerebral vascular accident) (CMS-HCC)   ??? DDD (degenerative disc disease), cervical   ??? Wheezing   ??? Dyslipidemia   ??? Angina pectoris (CMS-HCC)   ??? Papillary fibroelastoma of heart   ??? Antineoplastic chemotherapy induced anemia   ??? Celiac disease   ??? Iron deficiency anemia due to chronic blood loss   ??? Dry eye syndrome, bilateral   ??? Meibomian gland dysfunction (MGD) of both eyes   ??? Glaucoma suspect of both eyes   ??? Incipient cataract of both eyes   ??? 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)       Past Medical History:   Diagnosis Date   ??? Abnormal ECG unsure   ??? Alcoholism (CMS-HCC)    ??? Allergic rhinitis    ??? Anemia    ??? Anxiety     insomnia   ??? Arthritis not sure   ??? Brain concussion    ???  Breast cancer (CMS-HCC)     chemo for now with mets   ??? COPD (chronic obstructive pulmonary disease) (CMS-HCC)    ??? Depression    ??? Dysphagia    ??? Genitourinary disease    ??? GERD (gastroesophageal reflux disease)    ??? Headache    ??? HTN (hypertension)    ??? Hyperlipidemia    ??? Insomnia    ??? 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 Stroke (CMS-HCC) Nov. 2020    Dr. Theodoro Kalata   ??? Tobacco dependence        Past Surgical History:   Procedure Laterality Date   ??? CESAREAN SECTION     ??? CHOLECYSTECTOMY     ??? FINGER SURGERY Right     trigger finger   ??? GANGLION CYST EXCISION Left     hand   ??? HYSTERECTOMY     ??? LYMPH NODE BIOPSY Right 02/08/2016    Axillary   ??? PR BRNSCHSC TNDSC EBUS DX/TX INTERVENTION PERPH LES Left 03/05/2020    Procedure: Bronch, Rigid Or Flexible, Including Fluoro Guidance, When Performed; With Transendoscopic Ebus During Bronchoscopic Diagnostic Or Therapeutic Intervention(S) For Peripheral Lesion(S);  Surgeon: Jerelyn Charles, MD;  Location: MAIN OR [facility];  Service: Pulmonary   ??? PR BRONCHOSCOPY,COMPUTER ASSIST/IMAGE-GUIDED NAVIGATION Left 03/05/2020    Procedure: BRONCHOSCOPY, RIGID OR FLEXIBLE, INCLUDE FLUORO WHEN PERFORMED; W/COMPUTER-ASSIST, IMAGE-GUIDED NAVIGATION;  Surgeon: Jerelyn Charles, MD;  Location: MAIN OR [facility];  Service: Pulmonary   ??? PR BRONCHOSCOPY,DIAGNOSTIC W LAVAGE Left 03/05/2020    Procedure: Bronchoscopy, Rigid Or Flexible, Include Fluoroscopic Guidance When Performed; W/Bronchial Alveolar Lavage;  Surgeon: Jerelyn Charles, MD;  Location: MAIN OR [facility];  Service: Pulmonary   ??? PR BRONCHOSCOPY,TRANSBRON ASPIR BX Left 03/05/2020    Procedure: Bronchoscopy, Rigid/Flex, Incl Fluoro; W/Transbronch Ndl Aspirat Bx, Trachea, Main Stem &/Or Lobar Bronchus;  Surgeon: Jerelyn Charles, MD;  [facility];  Service: Pulmonary   ??? PR BRONCHOSCOPY,TRANSBRONCH BIOPSY Left 03/05/2020    Procedure: Bronchoscopy, Rigid/Flexible, Include Fluoro Guidance When Performed; W/Transbronchial Lung Bx, Single Lobe;  Surgeon: Jerelyn Charles, MD;  Location: MAIN OR [facility];  Service: Pulmonary   ??? PR CATH PLACE/CORON ANGIO, IMG SUPER/INTERP,W LEFT HEART VENTRICULOGRAPHY N/A 05/07/2020    Procedure: Left Heart Catheterization;  Surgeon: Lesle Reek, MD;  Location: [facility] CATH;  Service: Cardiology   ??? PR COLONOSCOPY W/BIOPSY SINGLE/MULTIPLE N/A 10/21/2016    Procedure: COLONOSCOPY, FLEXIBLE, PROXIMAL TO SPLENIC FLEXURE; WITH BIOPSY, SINGLE OR MULTIPLE;  Surgeon: Monte Fantasia, MD;  Location: GI PROCEDURES [facility];  Service: Gastroenterology   ??? PR EXCIS SUPRATENT BRAIN TUMOR Right 01/15/2019    Procedure: CRANIECTOMY; EXC BRAIN TUMOR-SUPRATENTORIAL;  Surgeon: Edison Simon, MD;  Location: MAIN OR [facility];  Service: Neurosurgery   ??? PR INCISE FINGER TENDON SHEATH Left 06/17/2019    Procedure: R-20 TENDON SHEATH INCISION (EG, FOR TRIGGER FINGER);  Surgeon: Daisy Lazar, MD;  Location: ASC OR [facility];  Service: Orthopedics   ??? PR MICROSURG TECHNIQUES,REQ OPER MICROSCOPE Right 01/15/2019    Procedure: MICROSURGICAL TECHNIQUES, REQUIRING USE OF OPERATING MICROSCOPE (LIST SEPARATELY IN ADDITION TO CODE FOR PRIMARY PROCEDURE);  Surgeon: Edison Simon, MD;  Location: MAIN OR [facility];  Service: Neurosurgery   ??? PR STEREOTACTIC COMP ASSIST PROC,CRANIAL,INTRADURAL Right 01/15/2019    Procedure: STEREOTACTIC COMPUTER-ASSISTED (NAVIGATIONAL) PROCEDURE; CRANIAL, INTRADURAL;  Surgeon: Edison Simon, MD;  Location: MAIN OR [facility];  Service: Neurosurgery   ??? PR UPPER  GI ENDOSCOPY,BIOPSY N/A 10/21/2016    Procedure: UGI ENDOSCOPY; WITH BIOPSY, SINGLE OR MULTIPLE;  Surgeon: Monte Fantasia, MD;  [facility]: [facility];  Service: Gastroenterology   ??? PR UPPER GI ENDOSCOPY,BIOPSY N/A 04/02/2018    Procedure: UGI ENDOSCOPY; WITH BIOPSY, SINGLE OR MULTIPLE;  Surgeon: Wendall Papa, MD;  [facility];  Service: Gastroenterology   ??? SKIN BIOPSY         Current Outpatient Medications   Medication Sig Dispense Refill   ??? albuterol HFA 90 mcg/actuation inhaler Inhale 2 puffs every six (6) hours as needed for wheezing.     ??? aluminum-magnesium hydroxide-simethicone 200-200-20 mg/5 mL Susp 80 mL, diphenhydrAMINE 12.5 mg/5 mL Liqd 200 mg, nystatin 100,000 unit/mL Susp 8,000,000 Units, distilled water Liqd 80 mL, lidocaine 2% viscous 2 % Soln 80 mL Take 5 mL by mouth every six (6) hours as needed. Swish, gargle, spit or swallow if throat issues 80 mL 2   ??? buprenorphine 15 mcg/hour PTWK transdermal patch Place 1 patch on the skin every seven (7) days for 28 days. 4 patch 0   ??? capecitabine (XELODA) 500 MG tablet Take 2 tablets (1,000 mg total) by mouth Two (2) times a day . For 14 days on and 7 days off 56 tablet 3   ??? clobetasoL (TEMOVATE) 0.05 % ointment Apply twice a day to rash on palm until smooth/clear. 30 g 1   ??? clonazePAM (KLONOPIN) 0.5 MG tablet Take 0.5 mg daily as needed for anxiety.  Do not exceed 0.5 mg/day. 30 tablet 0   ??? diclofenac sodium (VOLTAREN) 1 % gel Apply 2 g topically four (4) times a day as needed for arthritis or pain. 150 g 2   ??? diphenoxylate-atropine (LOMOTIL) 2.5-0.025 mg per tablet Take 1 tablet by mouth 4 (four) times a day as needed for diarrhea. 30 tablet 1   ??? dorzolamide (TRUSOPT) 2 % ophthalmic solution Administer 1 drop into the left eye Three (3) times a day. 10 mL 12   ??? empagliflozin (JARDIANCE) 10 mg tablet Take 1 tablet (10 mg total) by mouth in the morning. 30 tablet 11   ??? esomeprazole (NEXIUM) 40 MG capsule Take 1 capsule (40 mg total) by mouth daily. 90 capsule 3   ??? lasmiditan 50 mg Tab Take 50 mg by mouth once as needed (Once daily in 24hr period PRN migraine) for up to 1 dose. 7 tablet 1   ??? lidocaine (LIDODERM) 5 % patch Place 1 patch on the skin daily. Apply to affected area for 12 hours only each day (then remove patch) 30 patch 3   ??? lisinopriL-hydrochlorothiazide (PRINZIDE,ZESTORETIC) 20-25 mg per tablet Take 1 tablet by mouth in the morning. 90 tablet 0   ??? MAGNESIUM ORAL Take by mouth daily. OTC     ??? naloxone (NARCAN) 4 mg nasal spray One spray in either nostril once for known/suspected opioid overdose. May repeat every 2-3 minutes in alternating nostril til EMS arrives 2 each 0   ??? nicotine (NICODERM CQ) 21 mg/24 hr patch Place 1 patch on the skin daily. 28 patch 2   ??? nicotine polacrilex (NICORETTE) 4 MG gum Apply 1 each (4 mg total) to cheek every hour as needed for smoking cessation. 110 each 2   ??? ondansetron (ZOFRAN-ODT) 8 MG disintegrating tablet Take 1 tablet (8 mg total) by mouth every twelve (12) hours as needed for nausea. (Patient taking differently: Take 8 mg by mouth every twelve (12) hours as  needed for nausea. Patient has not started taking yet (will take with chemo)) 30 tablet 1   ??? polyethylene glycol (MIRALAX) 17 gram/dose powder Take 17 g by mouth Three (3) times a day as needed (constipation). 850 g 3   ??? potassium chloride (KLOR-CON) 10 MEQ CR tablet Take 1 tablet (10 mEq total) by mouth Two (2) times a day. 180 tablet 0   ??? pregabalin (LYRICA) 200 MG capsule Take 1 capsule (200 mg total) by mouth Two (2) times a day. 60 capsule 2   ??? prochlorperazine (COMPAZINE) 10 MG tablet Take 1 tablet (10 mg total) by mouth every six (6) hours as needed for nausea. 30 tablet 6   ??? saliva stimulant comb. no.3 (BIOTENE MOISTURIZING MOUTH) Spry Take 1 spray by mouth every two (2) hours as needed (dry mouth). 44.3 mL prn   ??? senna (SENOKOT) 8.6 mg tablet Take 3 tablets by mouth daily.     ??? thiamine HCl (VITAMIN B-1 ORAL) Take by mouth daily.      ??? traZODone (DESYREL) 50 MG tablet Take 1.5 tablets (75 mg total) by mouth nightly. 45 tablet 1   ??? tucatinib (TUKYSA) 150 mg tablet Take 1 tablet (150 mg total) by mouth two (2) times a day . 60 tablet 1   ??? tucatinib (TUKYSA) 50 mg tablet Take 2 tablets (100 mg total) by mouth two (2) times a day . 120 tablet 3   ??? venlafaxine (EFFEXOR-XR) 150 MG 24 hr capsule Take 150 mg capsule in conjunction with 75 mg capsule for a total of 225 mg by mouth daily 30 capsule 2   ??? venlafaxine (EFFEXOR-XR) 75 MG 24 hr capsule Take 75 mg capsule in conjunction with 150 mg capsule by mouth daily for a total of 225 mg by mouth daily 30 capsule 2     No current facility-administered medications for this visit.       Allergies:   Allergies   Allergen Reactions   ??? Adhesive Rash   ??? Decadron [Dexamethasone] Anxiety     Mania as well   ??? Morphine Itching   ??? Tetracycline      Other reaction(s): Other (See Comments)   ??? Oxycodone      Night terrors   ??? Tegaderm Adhesive-No Drug-Allergy Check Rash       Family History:  Cancer-related family history includes Cancer in her father, maternal grandfather, maternal uncle, maternal uncle, and paternal aunt.  She indicated that the status of her mother is unknown. She indicated that the status of her father is unknown. She indicated that the status of her brother is unknown. She indicated that the status of her maternal grandmother is unknown. She indicated that the status of her maternal grandfather is unknown. She indicated that the status of her paternal grandmother is unknown. She indicated that the status of her paternal grandfather is unknown. She indicated that the status of her maternal aunt is unknown. She indicated that the status of her paternal uncle is unknown. She indicated that the status of her neg hx is unknown. She indicated that the status of her other is unknown.           Lab Results   Component Value Date    CREATININE 0.62 04/06/2021     Lab Results   Component Value Date    ALKPHOS 505 (H) 04/06/2021    BILITOT 0.6 04/06/2021    BILIDIR 0.30 03/29/2021    PROT 7.6 04/06/2021  ALBUMIN 3.8 04/06/2021    ALT 64 (H) 04/06/2021    AST 100 (H) 04/06/2021             Burtis Junes, FNP-BC, [facility]  Outpatient Oncology Palliative Care Service  [facility]  [address], [place], [place] [address]  [phone]             The patient reports they are currently: at home. I spent 28 minutes on the phone with the patient on the date of service. I spent an additional  minutes on pre- and post-visit activities on the date of service.     The patient was physically located in [place] or a state in which I am permitted to provide care. The patient and/or parent/guardian understood that s/he may incur co-pays and cost sharing, and agreed to the telemedicine visit. The visit was reasonable and appropriate under the circumstances given the patient's presentation at the time.    The patient and/or parent/guardian has been advised of the potential risks and limitations of this mode of treatment (including, but not limited to, the absence of in-person examination) and has agreed to be treated using telemedicine. The patient's/patient's family's questions regarding telemedicine have been answered.     If the visit was completed in an ambulatory setting, the patient and/or parent/guardian has also been advised to contact their provider???s office for worsening conditions, and seek emergency medical treatment and/or call 911 if the patient deems either necessary.

## 2021-07-01 ENCOUNTER — Ambulatory Visit
Admit: 2021-07-01 | Discharge: 2021-07-02 | Payer: MEDICARE | Attending: Physical Medicine & Rehabilitation | Primary: Physical Medicine & Rehabilitation

## 2021-07-01 MED ADMIN — triamcinolone acetonide (KENALOG-40) injection 40 mg: 40 mg | INTRA_ARTICULAR | @ 14:00:00 | Stop: 2021-07-01

## 2021-07-01 MED ADMIN — lidocaine (XYLOCAINE) 10 mg/mL (1 %) injection 5 mL: 5 mL | @ 14:00:00 | Stop: 2021-07-01

## 2021-07-01 NOTE — Unmapped (Signed)
PM&R - [facility]     Patient Information     Patient Name:Brooke [name]  MRN: [id number]  DOB: Apr 17, 1956  Age: 65 y.o.     Assessment & Plan     07/01/2021     DIAGNOSIS:   1.  Cervical spondylosis  2.  Cervical myofascial pain   3.  History of breast cancer with known lung metastases  Primarily axial and myofascial pain with no radicular component at this time.  X-ray cervical spine revealed no acute fracture or listhesis of the cervical spine. Mild osseous neural foraminal narrowing on the right at C4-5 secondary to facet arthropathy.  TREATMENT PLAN: MRI cervical spine with and without contrast, baclofen 5 mg 3 times daily as needed  NEXT STEPS/FOLLOW UP: Began spine directed physical therapy, which she would like to complete in [place]    After informed consent, cervical thoracic paraspinal trigger point injections were completed bilaterally today  PROCEDURE: cervical-thoracic Parraspinal Muscle TPI  INDICATION: myofascial pain  TECHNIQUE: The patient's points of maximal tenderness were palpated, reproducing the worst pain/spasm. After written informed consent was obtained with risks (bleeding, infection, nerve/muscle injury, no improvement in pain, worsening in pain) and benefits reviewed, the skin over these areas were prepped with chlorhexadine swabs. Then, a 25G, 1.5 needle was used at advanced to the point of maximal tenderness. After negative aspiration, 5 ml of 1% lidocaine and 1 mg of triamcinolone was injected, reproducing the patients pain. This was repeated for a total of 4 trigger points in patient's bilateral cervical and thoracic paraspinal muscles. Afterwards hemostasis was confirmed and a bandage was applied The patient tolerated the procedure well and noted significant improvement in the pain.   COMPLICATIONS: The patient tolerated the procedure well and there were no complications.  POST-PROCEDURE EXAM: Marked reduction in pain (>50%) with functional improvement in cervical spine ROM and reduction in pain with walking and standing. No new neuromotor compromise.    Comprehensive patient education performed today explaining that the care plan will integrate multimodal activity-based rehabilitation techniques to enhance recovery, reduce pain, and facilitate functionality. Risks, benefits, and instructions for all medications prescribed / treatments offered  reviewed extensively with patient, who expresses understanding.  Patient strongly advised regarding red flag signs such as new or progressive motor weakness, sensory deficits, saddle anesthesia, bowel/bladder dysfunction, gait/coordination disturbance, weight loss, and night pain that should prompt evaluation at nearest ER.    Subjective     Chief Complaint   Neck pain    History of Present Illness   Brooke [name] is a 65 y.o. year old female     Neck pain  History of breast cancer with known lung metastases  No cervical spine imaging at this time  Started a few months ago  Worse with standing  Heat and sleep help  Tried flexeril and hydrocodone without much relie  The Voltaren cream does help  Has been told by cardiologist (per patient) that some of her back pain may be cardiac and she has a cath scheduled with plans for possible stent placement    Unintended Weight Loss: no  Fever/Infection:no  Neuromotor Function: motor weakness - (no),  gait/coordination disturbance - (no), loss of bowel or bladder control - (no), saddle anesthesia - (no)     Medications     Current Outpatient Medications   Medication Sig Dispense Refill   ??? albuterol HFA 90 mcg/actuation inhaler Inhale 2 puffs every six (6) hours as needed for wheezing.     ???  aluminum-magnesium hydroxide-simethicone 200-200-20 mg/5 mL Susp 80 mL, diphenhydrAMINE 12.5 mg/5 mL Liqd 200 mg, nystatin 100,000 unit/mL Susp 8,000,000 Units, distilled water Liqd 80 mL, lidocaine 2% viscous 2 % Soln 80 mL Take 5 mL by mouth every six (6) hours as needed. Swish, gargle, spit or swallow if throat issues 80 mL 2   ??? buprenorphine-naloxone (SUBOXONE) 2-0.5 mg sublingual film Day 1-Take 1 film (2 mg) under tongue twice a day. Day 2-increase to 1 film (2 mg) every 8 hours (3x/day). Day 3 increase to 2 films (4 mg) twice a day for a 14 day supply. 53 Film 0   ??? capecitabine (XELODA) 500 MG tablet Take 2 tablets (1,000 mg total) by mouth Two (2) times a day . For 14 days on and 7 days off 56 tablet 3   ??? clobetasoL (TEMOVATE) 0.05 % ointment Apply twice a day to rash on palm until smooth/clear. 30 g 1   ??? clonazePAM (KLONOPIN) 0.5 MG tablet Take 0.5 mg daily as needed for anxiety.  Do not exceed 0.5 mg/day. 30 tablet 0   ??? diclofenac sodium (VOLTAREN) 1 % gel Apply 2 g topically four (4) times a day as needed for arthritis or pain. 150 g 2   ??? diphenoxylate-atropine (LOMOTIL) 2.5-0.025 mg per tablet Take 1 tablet by mouth 4 (four) times a day as needed for diarrhea. 30 tablet 1   ??? dorzolamide (TRUSOPT) 2 % ophthalmic solution Administer 1 drop into the left eye Three (3) times a day. 10 mL 12   ??? esomeprazole (NEXIUM) 40 MG capsule Take 1 capsule (40 mg total) by mouth daily. 90 capsule 3   ??? lidocaine (LIDODERM) 5 % patch Place 1 patch on the skin daily. Apply to affected area for 12 hours only each day (then remove patch) 30 patch 3   ??? lisinopriL-hydrochlorothiazide (PRINZIDE,ZESTORETIC) 20-25 mg per tablet Take 1 tablet by mouth in the morning. 90 tablet 0   ??? MAGNESIUM ORAL Take by mouth daily. OTC     ??? naloxone (NARCAN) 4 mg nasal spray One spray in either nostril once for known/suspected opioid overdose. May repeat every 2-3 minutes in alternating nostril til EMS arrives 2 each 0   ??? nicotine polacrilex (NICORETTE) 4 MG gum Apply 1 each (4 mg total) to cheek every hour as needed for smoking cessation. 110 each 2   ??? ondansetron (ZOFRAN-ODT) 8 MG disintegrating tablet Take 1 tablet (8 mg total) by mouth every twelve (12) hours as needed for nausea. (Patient taking differently: Take 8 mg by mouth every twelve (12) hours as needed for nausea. Patient has not started taking yet (will take with chemo)) 30 tablet 1   ??? potassium chloride (KLOR-CON) 10 MEQ CR tablet Take 1 tablet (10 mEq total) by mouth Two (2) times a day. 180 tablet 0   ??? pregabalin (LYRICA) 200 MG capsule Take 1 capsule (200 mg total) by mouth Two (2) times a day. 60 capsule 2   ??? prochlorperazine (COMPAZINE) 10 MG tablet Take 1 tablet (10 mg total) by mouth every six (6) hours as needed for nausea. 30 tablet 6   ??? senna (SENOKOT) 8.6 mg tablet Take 3 tablets by mouth daily.     ??? thiamine HCl (VITAMIN B-1 ORAL) Take by mouth daily.      ??? traZODone (DESYREL) 50 MG tablet Take 1.5 tablets (75 mg total) by mouth nightly. 45 tablet 1   ??? tucatinib (TUKYSA) 150 mg tablet Take 1 tablet (150 mg  total) by mouth two (2) times a day . 60 tablet 1   ??? tucatinib (TUKYSA) 50 mg tablet Take 2 tablets (100 mg total) by mouth two (2) times a day . 120 tablet 3   ??? venlafaxine (EFFEXOR-XR) 150 MG 24 hr capsule Take 150 mg capsule in conjunction with 75 mg capsule for a total of 225 mg by mouth daily 30 capsule 2   ??? venlafaxine (EFFEXOR-XR) 75 MG 24 hr capsule Take 75 mg capsule in conjunction with 150 mg capsule by mouth daily for a total of 225 mg by mouth daily 30 capsule 2   ??? empagliflozin (JARDIANCE) 10 mg tablet Take 1 tablet (10 mg total) by mouth in the morning. 30 tablet 11     No current facility-administered medications for this visit.       Allergies   Adhesive, Decadron [dexamethasone], Tetracycline, Oxycodone, and Tegaderm adhesive-no drug-allergy check    Past Medical/Surgical/Social/Family History     Past Medical History:   Diagnosis Date   ??? Abnormal ECG unsure   ??? Alcoholism (CMS-HCC)    ??? Allergic rhinitis    ??? Anemia    ??? Anxiety     insomnia   ??? Arthritis not sure   ??? Brain concussion    ??? Breast cancer (CMS-HCC)     chemo for now with mets   ??? COPD (chronic obstructive pulmonary disease) (CMS-HCC)    ??? Depression    ??? Dysphagia    ??? Genitourinary disease    ??? GERD (gastroesophageal reflux disease)    ??? Headache    ??? HTN (hypertension)    ??? Hyperlipidemia    ??? Insomnia    ??? 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 Stroke (CMS-HCC) Nov. 2020    Dr. Theodoro Kalata   ??? Tobacco dependence      Patient Active Problem List    Diagnosis Date Noted   ??? 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) 05/07/2021   ??? Dry eye syndrome, bilateral 02/23/2021   ??? Meibomian gland dysfunction (MGD) of both eyes 02/23/2021   ??? Glaucoma suspect of both eyes 02/23/2021   ??? Incipient cataract of both eyes 02/23/2021   ??? Iron deficiency anemia due to chronic blood loss 12/11/2020   ??? Celiac disease 09/01/2020   ??? Antineoplastic chemotherapy induced anemia 08/04/2020   ??? 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 Angina pectoris (CMS-HCC) 04/24/2020   ??? DDD (degenerative disc disease), cervical 03/02/2020   ??? Wheezing 03/02/2020   ??? Dyslipidemia 03/02/2020   ??? CVA (cerebral vascular accident) (CMS-HCC) 11/11/2019   ??? Essential hypertension 04/23/2019   ??? Migraine without aura and without status migrainosus, not intractable 03/04/2019   ??? Metastatic breast cancer (CMS-HCC) 01/25/2019   ??? Macrocytosis 10/15/2018   ??? Pelvic fracture (CMS-HCC) 10/15/2018   ??? Closed fracture of multiple pubic rami, right, initial encounter (CMS-HCC) 09/26/2018   ??? Hyponatremia 09/22/2018   ??? Back pain 09/22/2018   ??? Depression 03/23/2018   ??? Failure to thrive in adult 03/22/2018   ??? Tobacco use 03/22/2018   ??? Chronic diarrhea 03/22/2018   ??? Diarrhea of presumed infectious origin 12/08/2017   ??? Closed fracture of one rib with routine healing 08/25/2017   ??? Nausea 08/17/2017   ??? Brain metastases (CMS-HCC) 08/07/2017   ??? Peripheral neuropathy    ??? Insomnia    ??? Malignant neoplasm of overlapping sites of right female breast (CMS-HCC) 02/09/2016  Past Surgical History:   Procedure Laterality Date   ??? CESAREAN SECTION     ??? CHOLECYSTECTOMY     ??? FINGER SURGERY Right     trigger finger   ??? GANGLION CYST EXCISION Left     hand   ??? HYSTERECTOMY     ??? LYMPH NODE BIOPSY Right 02/08/2016    Axillary   ??? PR BRNSCHSC TNDSC EBUS DX/TX INTERVENTION PERPH LES Left 03/05/2020    Procedure: Bronch, Rigid Or Flexible, Including Fluoro Guidance, When Performed; With Transendoscopic Ebus During Bronchoscopic Diagnostic Or Therapeutic Intervention(S) For Peripheral Lesion(S);  Surgeon: Jerelyn Charles, MD;  Location: MAIN OR [facility];  Service: Pulmonary   ??? PR BRONCHOSCOPY,COMPUTER ASSIST/IMAGE-GUIDED NAVIGATION Left 03/05/2020    Procedure: BRONCHOSCOPY, RIGID OR FLEXIBLE, INCLUDE FLUORO WHEN PERFORMED; W/COMPUTER-ASSIST, IMAGE-GUIDED NAVIGATION;  Surgeon: Jerelyn Charles, MD;  Location: MAIN OR [facility];  Service: Pulmonary   ??? PR BRONCHOSCOPY,DIAGNOSTIC W LAVAGE Left 03/05/2020    Procedure: Bronchoscopy, Rigid Or Flexible, Include Fluoroscopic Guidance When Performed; W/Bronchial Alveolar Lavage;  Surgeon: Jerelyn Charles, MD;  Location: [facility];  Service: Pulmonary   ??? PR BRONCHOSCOPY,TRANSBRON ASPIR BX Left 03/05/2020    Procedure: Bronchoscopy, Rigid/Flex, Incl Fluoro; W/Transbronch Ndl Aspirat Bx, Trachea, Main Stem &/Or Lobar Bronchus;  Surgeon: Jerelyn Charles, MD;  Location: MAIN OR [facility];  Service: Pulmonary   ??? PR BRONCHOSCOPY,TRANSBRONCH BIOPSY Left 03/05/2020    Procedure: Bronchoscopy, Rigid/Flexible, Include Fluoro Guidance When Performed; W/Transbronchial Lung Bx, Single Lobe;  Surgeon: Jerelyn Charles, MD;  Location: MAIN OR [facility];  Service: Pulmonary   ??? PR CATH PLACE/CORON ANGIO, IMG SUPER/INTERP,W LEFT HEART VENTRICULOGRAPHY N/A 05/07/2020    Procedure: Left Heart Catheterization;  Surgeon: Lesle Reek, MD;  Location: [facility] C[facility];  Service: Cardiology   ??? PR COLONOSCOPY W/BIOPSY SINGLE/MULTIPLE N/A 10/21/2016    Procedure: COLONOSCOPY, FLEXIBLE, PROXIMAL TO SPLENIC FLEXURE; WITH BIOPSY, SINGLE OR MULTIPLE;  Surgeon: Monte Fantasia, MD;  [facility]ED[facility];  Service: Gastroenterology   ??? PR EXCIS SUPRATENT BRAIN TUMOR Right 01/15/2019    Procedure: CRANIECTOMY; EXC BRAIN TUMOR-SUPRATENTORIAL;  Surgeon: Edison Simon, MD;  Location: MAIN OR [facility];  Service: Neurosurgery   ??? PR INCISE FINGER TENDON SHEATH Left 06/17/2019    Procedure: R-20 TENDON SHEATH INCISION (EG, FOR TRIGGER FINGER);  Surgeon: Daisy Lazar, MD;  Location: ASC OR [facility];  Service: Orthopedics   ??? PR MICROSURG TECHNIQUES,REQ OPER MICROSCOPE Right 01/15/2019    Procedure: MICROSURGICAL TECHNIQUES, REQUIRING USE OF OPERATING MICROSCOPE (LIST SEPARATELY IN ADDITION TO CODE FOR PRIMARY PROCEDURE);  Surgeon: Edison Simon, MD;  Location: MAIN OR [facility];  Service: Neurosurgery   ??? PR STEREOTACTIC COMP ASSIST PROC,CRANIAL,INTRADURAL Right 01/15/2019    Procedure: STEREOTACTIC COMPUTER-ASSISTED (NAVIGATIONAL) PROCEDURE; CRANIAL, INTRADURAL;  Surgeon: Edison Simon, MD;  Location: MAIN OR [facility];  Service: Neurosurgery   ??? PR UPPER GI ENDOSCOPY,BIOPSY N/A 10/21/2016    Procedure: UGI ENDOSCOPY; WITH BIOPSY, SINGLE OR MULTIPLE;  Surgeon: Monte Fantasia, MD;  [facility];  Service: Gastroenterology   ??? PR UPPER GI ENDOSCOPY,BIOPSY N/A 04/02/2018    Procedure: UGI ENDOSCOPY; WITH BIOPSY, SINGLE OR MULTIPLE;  Surgeon: Wendall Papa, MD;  [facility];  Service: Gastroenterology   ??? SKIN BIOPSY         Social History     Socioeconomic History   ??? Marital status: Divorced     Spouse name: None   ??? Number of children:  None   ??? Years of education: None   ??? Highest education level: None   Tobacco Use   ??? Smoking status: Current Every Day Smoker     Packs/day: 1.00     Years: 40.00     Pack years: 40.00     Types: Cigarettes   ??? Smokeless tobacco: Never Used   ??? Tobacco comment: 10-20 single    Vaping Use   ??? Vaping Use: Never used   Substance and Sexual Activity   ??? Alcohol use: Not Currently Alcohol/week: 0.0 standard drinks     Comment: Have stopped about 6 months ago   ??? Drug use: No   ??? Sexual activity: Yes     Partners: Male     Birth control/protection: Post-menopausal   Other Topics Concern   ??? Exercise No   ??? Living Situation Yes   ??? Do you use sunscreen? No   ??? Tanning bed use? No   Social History Narrative    She lives in senior housing in [place] x2 months. he has 4 sisters, 2 nearby and involved in care. She has 1 son-[name], recently married 110 yo, who lives about an hour and a half away in [place]. He works full-time as a Sales executive. She is still a chronic smoker (1ppd). She is currently divorced.         Goes to [employer] in [place] and gets a lot of support. Her church friend [name] and her sisters goes with her to chemo appts.     Social Determinants of Health     Financial Resource Strain: Medium Risk   ??? Difficulty of Paying Living Expenses: Somewhat hard   Food Insecurity: Food Insecurity Present   ??? Worried About Programme researcher, broadcasting/film/video in the Last Year: Sometimes true   ??? Ran Out of Food in the Last Year: Sometimes true       Family History   Problem Relation Age of Onset   ??? Cancer Father    ??? Heart failure Father    ??? Heart attack Father    ??? Diabetes Sister    ??? Heart failure Sister    ??? Heart attack Sister    ??? Heart disease Sister    ??? Hypertension Sister    ??? Heart failure Brother    ??? Heart attack Brother    ??? Heart disease Brother    ??? Hypertension Brother    ??? Cancer Maternal Uncle    ??? Cancer Maternal Grandfather         lung cancer   ??? COPD Mother    ??? Hypertension Mother    ??? No Known Problems Maternal Aunt    ??? No Known Problems Paternal Aunt    ??? No Known Problems Paternal Uncle    ??? Heart disease Maternal Grandmother         mini strokes   ??? No Known Problems Paternal Grandmother    ??? No Known Problems Paternal Grandfather    ??? No Known Problems Other    ??? Cancer Maternal Uncle         Lung Cancer   ??? Cancer Paternal Aunt         Breast Cancer   ??? Diabetes Sister         Type 2   ??? Hypertension Sister    ??? Diabetes Sister         not sure what type   ??? Hypertension Sister    ???  Hypertension Sister    ??? Anesthesia problems Neg Hx    ??? Broken bones Neg Hx    ??? Clotting disorder Neg Hx    ??? Collagen disease Neg Hx    ??? Dislocations Neg Hx    ??? Fibromyalgia Neg Hx    ??? Gout Neg Hx    ??? Hemophilia Neg Hx    ??? Osteoporosis Neg Hx    ??? Rheumatologic disease Neg Hx    ??? Scoliosis Neg Hx    ??? Severe sprains Neg Hx    ??? Sickle cell anemia Neg Hx    ??? Spinal Compression Fracture Neg Hx        For any of the above entries which indicate no records on file, the patient reports no relevant history.    Review of Systems   Pertinent positives are noted in HPI.  Patient has been instructed to followup with PCP or appropriate specialist for symptoms outside the purview of this speciality.    Objective     Vitals   Temp 36.1 ??C (96.9 ??F)  - Ht 158 cm (5' 2.21)  - Wt 58.4 kg (128 lb 12.8 oz)  - LMP  (LMP Unknown)  - Breastfeeding No  - BMI 23.40 kg/m??     Physical Exam   GEN: alert and oriented, no apparent distress  HEENT: normocephalic, atraumatic, anicteric, moist mucous membranes  CV: normal heart rate  PULM: normal work of breathing  EXT: no swelling, edema in b/l UE and LE  SKIN: no visible ecchymosis or breakdown  PSYCH: normal mood and affect    NEURO:   Motor   LUE 5/5 shoulder abductor, 5/5 elbow flexor, 5/5 elbow extender 5/5 wrist extensor, 5/5 wrist flexor, 5/5 finger abductor  RUE 5/5 shoulder abductor, 5/5 elbow flexor, 5/5 elbow extender 5/5 wrist extensor, 5/5 wrist flexor, 5/5 finger abductor  Negative Hoffmann's    Imaging   I independently reviewed the x-ray cervical spine, which revealed: No fractures, + multilevel cervical facet arthropathy    Marijean Bravo Elby Showers) Glenice Laine, MD  Assistant Professor - PM&R  Assistant Professor - Department of Social Medicine  Interventional Spine Specialist - [facility]  [employer]

## 2021-07-02 DIAGNOSIS — C50811 Malignant neoplasm of overlapping sites of right female breast: Principal | ICD-10-CM

## 2021-07-02 MED ORDER — CAPECITABINE 500 MG TABLET
ORAL_TABLET | 5 refills | 0 days | Status: CP
Start: 2021-07-02 — End: ?
  Filled 2021-07-12: qty 60, 30d supply, fill #0

## 2021-07-02 NOTE — Unmapped (Signed)
Pharmacist Phone Follow-Up    Cancer Team  Medical Oncology: Dr. [name]  Reason for call: Oral chemotherapy management  Current treatment: Trastuzumab + tucatininb + capecitabine     Breast cancer:  Brooke [name] is a 65 yo woman with HER2+ metastatic breast cancer who started trastuzumab, tucatinib, and capecitabine on 4/18.  Due to issues with adherence, the capecitabine dose will be changed to metronomic dosing.  We are concerned that Brooke [name] may not take capecitabine appropriately using the standard 14 days on and 7 days off schedule.  She had trouble tracking her weeks on and off and at one point she may not have stopped her capecitabine after taking it for 14 days.    I reviewed with Brooke [name], that her capecitabine dose will be changed to 500 mg po BID on a continuous schedule.  The new dosing will start on 07/06/21.  She is to continue tucatinib 250 mg BID.    Oral chemotherapy regimen:??   Starting 7/12:   Tucatinib 250 mg BID   ????????????????????????Capecitabine 500 mg BID, continuously (metronomic dosing to improve adherence)  ????????????????????????Trastuzumab IV q 3weeks     Starting 5/30 doses reduced to:   Tucatinib 250 mg BID (started on 6/5) --> she took 150 mg po BID (5/30-6/4) then increased on 6/5 to 250 mg after receiving 50  mg tabs from the pharmacy  ????????????????????????Capecitabine 1000 mg BID x 14 days then 7 days off  ????????????????????????Trastuzumab IV q 3weeks     Started 04/12/21:  ????????????????????????Tucatinib 300 mg po BID continuous  ????????????????????????Capecitabine 1500 mg BID x 14 days then 7 days off (stopped during first cycle d/t diarrhea)  ????????????????????????Trastuzumab IV q 3weeks  Start date: 04/12/21  Pharmacy: [facility]     Patient verbalized understanding to the new plan.  Approximate time spent on phone with patient: 10 minutes

## 2021-07-04 NOTE — Unmapped (Signed)
The patient reports they are currently: at home. I spent 25 minutes on the phone with the patient on the date of service. I spent an additional 10 minutes on pre- and post-visit activities on the date of service.     The patient was physically located in [place] or a state in which I am permitted to provide care. The patient and/or parent/guardian understood that s/he may incur co-pays and cost sharing, and agreed to the telemedicine visit. The visit was reasonable and appropriate under the circumstances given the patient's presentation at the time.    The patient and/or parent/guardian has been advised of the potential risks and limitations of this mode of treatment (including, but not limited to, the absence of in-person examination) and has agreed to be treated using telemedicine. The patient's/patient's family's questions regarding telemedicine have been answered.     If the visit was completed in an ambulatory setting, the patient and/or parent/guardian has also been advised to contact their provider???s office for worsening conditions, and seek emergency medical treatment and/or call 911 if the patient deems either necessary.        Brooke [name] is a 65 y.o. female  is here for   Chief Complaint   Patient presents with   ??? Hypertension   ??? telephone visit       Assessment/Plan:    Brooke [name] was seen today for hypertension and telephone visit.    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      -History of wheezing and tobacco use disorder: probable COPD given her history of tobacco use disorder. She is using Nicotine patach as directed. She was smoking less with patch on along with nicorette gum and wellbutrin on board as well.  Pt states she is unable to stop smoking due to stress. She continues on albuterol inhaler for prn use. The pt states she stopped wellbutrin but is planning to resume it.  She was referred to [facility] pulmonary clinic she was seen in September/2021. The following is noted from that encounter:    ASSESSMENT    ??  Brooke [name] is a 65 y.o. female with a history of GERD, depression, HTN, dyslipidemia, and metastatic HER2+ breast cancer (s/p radiotherapy and chemotherapy with Trastuzumab/Pertuzumab)  whom we are seeing in consultation requested by [name] for evaluation of shortness of breath.  Patient is presenting with dyspnea on exertion, dry cough and wheezing (mostly nocturnal) which started approximately a year after starting her breast cancer therapy. Although the history is suggestive of airway disease (possibly worsened by ongoing smoking, sinus and gastroesophageal diseases), her spirometry is more suggestive of moderate restriction with no evidence of airway obstruction. I think some of her symptoms can still be related to GERD/sinus disease and possible airway disease despite spirometric findings.   She does not have significant parenchymal lung disease on CT to explain moderate restriction. Therefore,  I suspect a component of neuromuscular weakness (?possibly due to chemotherapy). Additionally, she has anemia that worsened over the past several months (Hgb 13 in 12/2018, now ~7.5). Diastolic dysfunction is also possible given her age and co-morbidities.  Therefore, her dyspnea on exertion is likely multi-factorial. I cannot rule out venous thromboembolism given cancer history.   PLAN     Dyspnea on exertion (multi-factorial)  -- Lung volumes, DLCO, and to further investigate for pulmonary restriction   -- VQ scan to r/o CTED  -- Muscle pressures to r/o neuromuscular weakness  -- If the above workup is inconclusive, will obtain CPET  -- Ok to continue PRN albuterol for now  -- Lifestyle modifications for GERD provided; continue PPI  -- Saline nasal rinses followed by nasal steroids spray  Health maintenance:  -- Vaccines: Will give the flu vaccine today; received the mRNA COVID vaccine  -- Smoking cessation: discussed at length; will refer to the tobacco cessation clinic  -- Lung cancer screening: will discuss next visit??  Patient will return to clinic in 3 months or sooner if needed.  ??    History of breast cancer with known Lung and brain mets: The patient is followed closely by [facility] oncology team.  Surveillance chest CT done January/2022 revealed no significant interval change compared to previous study done December/2021.  Abdominal MRI done in March/2022 revealed focal area of hepatic steatosis but no suspicious hepatic lesions identified.  LFTs remained elevated on CMP done April/2022. She has an upcoming imaging of her chest and Abd (CT) and brain MRI in the near future along with bone scan.  She is having headaches. Her next oncology visit is scheduled for next month and radiation oncology next week.     depression/insomnia history:  she remains stable on her current anti depressant with no acute depression symptoms. She takes trazodone prn  for insomnia symptoms.  She is followed by [facility] psychiatry and she has an appt in the next 2 weeks.Marland Kitchen      History  Dyslipidemia, HTN and CVA:  her BP has remained normal on current medication regimen.  Her home BP readings remain normal and BP readings have been normal on recent provider visit.   She endorses no acute neurologic issues.  She had normal serum electrolytes and creatinine level in April/2022.   Lipid panel done 12/2019 revealed HDL 68 and LDL of 98.     She states she was told to stop statin (due to chronic pain complaint and elevated lft's) and she did for the past couple of months.  She is due forlipid panel and I will add on this lab request to her recent lab work done 2 days ago.   Most recent LFT's were normal except for chronically elevated ALK phos level but improved at 203.    Chronic neck and upper back/shoulder pain: she has known cervical DDD on C spine MRI in 04/2020.: she has ongoing neck and upper back pain.  She has no numbness or weakness of her UE.  She is referred to [facility] orthopaedics and NS,  for further evaluation. She is followed by neurology as well.  Recently started on Subxxone.  She continues on Lyrica and currently reducing dose.   She has been prescribed baclofen 5 mg 3 times daily as needed.She is no longer on any controlled medications for pain.  She has been by [facility] spine clinic.    - fibroelastoma of mitral valve and history of diastolic heart failure: .She is  followed  by  [facility] cardiology .  She had left heart catheterization in May/2021. Findings revealed no significant CAD with normal left ventricular filling pressures. Echocardiogram was done in February/2021. 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 Otherwise normal. She was seen by [facility] cardiology in 05/2021 and the following is noted :    Impression and Plan:  Brooke [name] is a 65 y.o. female with a history of DM1, HTN, HLD and metastatic breast Cx (on Trastuzumab) who is presenting for cardiology follow up of MV mass.  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 Minimal MR. Likely not etiology of dyspnea.   2. Dyspnea on Exertion/ Chest pain: Concerning for angina pectoris and obstructive coronary artery disease. ??Less likely related to heart failure given physical exam and history without orthopnea, PND, lower extremity swelling. ??No CAD on cath. ??  Discussed the need to stop smoking.  Encouraged her to continue to work on tobacco cessation. Doing better. ??  Today she will begin Jardiance 10mg  daily for diastolic heart failure c/w EMPEROR-Preserved trial. We discussed the some of the side effects of this medication, particularly low blood sugar and increased risk of vaginal yeast infection.  3. HTN,??well controlled today. Continues to follow up with [name].  She will continue current regimen  4. ??HLD  Continue atorvastatin  Return in about 6 months (around 11/27/2021).   Correction in above cardiology note, the pt is no longer taking atorvastatin and she has started jardiance for diastolic heart failure.     - recent diagnosis of Celiac disease- she is followed by [facility] GI for elevated LFT's/fatty liver disease and last seen by GI in 03/2021 for fibroscan.  Screening viral hepatitis and lupus related lab work/negative.   She is off statin.  She has a follow up appt with liver clinic on 09/21/2021.      Return in about 4 months (around 11/08/2021) for Recheck. (in person visit is preferred).    Subjective:     HPI  Reason for visit today- routine follow-up visit.  I last saw the patient November/2021 with the following medical conditions:      -History of wheezing and tobacco use disorder: probable COPD given her history of tobacco use disorder. She is using Nicotine patach as directed. She was smoking less with patch on along with nicorette gum and wellbutrin on board as well.  Pt states she is unable to stop smoking due to stress. She continues on albuterol inhaler for prn use. The pt states she stopped wellbutrin but is planning to resume it.  She was referred to [facility] pulmonary clinic she was seen in September/2021. The following is noted from that encounter:    ASSESSMENT    ??  Brooke [name] is a 65 y.o. female with a history of GERD, depression, HTN, dyslipidemia, and metastatic HER2+ breast cancer (s/p radiotherapy and chemotherapy with Trastuzumab/Pertuzumab)  whom we are seeing in consultation requested by [name] for evaluation of shortness of breath.  Patient is presenting with dyspnea on exertion, dry cough and wheezing (mostly nocturnal) which started approximately a year after starting her breast cancer therapy. Although the history is suggestive of airway disease (possibly worsened by ongoing smoking, sinus and gastroesophageal diseases), her spirometry is more suggestive of moderate restriction with no evidence of airway obstruction. I think some of her symptoms can still be related to GERD/sinus disease and possible airway disease despite spirometric findings.   She  does not have significant parenchymal lung disease on CT to explain moderate restriction. Therefore, I suspect a component of neuromuscular weakness (?possibly due to chemotherapy). Additionally, she has anemia that worsened over the past several months (Hgb 13 in 12/2018, now ~7.5). Diastolic dysfunction is also possible given her age and co-morbidities.  Therefore, her dyspnea on exertion is likely multi-factorial. I cannot rule out venous thromboembolism given cancer history.   PLAN     Dyspnea on exertion (multi-factorial)  -- Lung volumes, DLCO, and to further investigate for pulmonary restriction   -- VQ scan to r/o CTED  -- Muscle pressures to r/o neuromuscular weakness  -- If the above workup is inconclusive, will obtain CPET  -- Ok to continue PRN albuterol for now  -- Lifestyle modifications for GERD provided; continue PPI  -- Saline nasal rinses followed by nasal steroids spray  Health maintenance:  -- Vaccines: Will give the flu vaccine today; received the mRNA COVID vaccine  -- Smoking cessation: discussed at length; will refer to the tobacco cessation clinic  -- Lung cancer screening: will discuss next visit??  Patient will return to clinic in 3 months or sooner if needed.  ??    History of breast cancer with known Lung and brain mets: The patient is followed closely by [facility] oncology team.  Surveillance chest CT done January/2022 revealed no significant interval change compared to previous study done December/2021.  Abdominal MRI done in March/2022 revealed focal area of hepatic steatosis but no suspicious hepatic lesions identified.  LFTs remained elevated on CMP done April/2022. She has an upcoming imaging of her chest and Abd (CT) and brain MRI in the near future along with bone scan.  She is having headaches. Her next oncology visit is scheduled for next month and radiation oncology next week.     depression/insomnia history:  she remains stable on her current anti depressant with no acute depression symptoms. She takes trazodone prn  for insomnia symptoms.  She is followed by [facility] psychiatry and she has an appt in the next 2 weeks.Marland Kitchen      History  Dyslipidemia, HTN and CVA:  her BP has remained normal on current medication regimen.  Her home BP readings remain normal and BP readings have been normal on recent provider visit.   She endorses no acute neurologic issues.  She had normal serum electrolytes and creatinine level in April/2022.   Lipid panel done 12/2019 revealed HDL 68 and LDL of 98.     She states she was told to stop statin (due to chronic pain complaint and elevated lft's) and she did for the past couple of months.  She is due forlipid panel and I will add on this lab request to her recent lab work done 2 days ago.   Most recent LFT's were normal except for chronically elevated ALK phos level but improved at 203.    Chronic neck and upper back/shoulder pain: she has known cervical DDD on C spine MRI in 04/2020.: she has ongoing neck and upper back pain.  She has no numbness or weakness of her UE.  She is referred to [facility] orthopaedics and NS,  for further evaluation. She is followed by neurology as well.  Recently started on Subxxone.  She continues on Lyrica and currently reducing dose.   She has been prescribed baclofen 5 mg 3 times daily as needed.She is no longer on any controlled medications for pain.  She has been by [facility] spine clinic.    -  fibroelastoma of mitral valve and history of diastolic heart failure: .She is followed  by  [facility] cardiology .  She had left heart catheterization in May/2021. Findings revealed no significant CAD with normal left ventricular filling pressures. Echocardiogram was done in February/2021. 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 Otherwise normal. She was seen by [facility] cardiology in 05/2021 and the following is noted :    Impression and Plan:  Brooke [name] is a 65 y.o. female with a history of DM1, HTN, HLD and metastatic breast Cx (on Trastuzumab) who is presenting for cardiology follow up of MV mass.  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 Minimal MR. Likely not etiology of dyspnea.   2. Dyspnea on Exertion/ Chest pain: Concerning for angina pectoris and obstructive coronary artery disease. ??Less likely related to heart failure given physical exam and history without orthopnea, PND, lower extremity swelling. ??No CAD on cath. ??  Discussed the need to stop smoking.  Encouraged her to continue to work on tobacco cessation. Doing better. ??  Today she will begin Jardiance 10mg  daily for diastolic heart failure c/w EMPEROR-Preserved trial. We discussed the some of the side effects of this medication, particularly low blood sugar and increased risk of vaginal yeast infection.  3. HTN,??well controlled today. Continues to follow up with [name].  She will continue current regimen  4. ??HLD  Continue atorvastatin  Return in about 6 months (around 11/27/2021).   Correction in above cardiology note, the pt is no longer taking atorvastatin and she has started jardiance for diastolic heart failure.     - recent diagnosis of Celiac disease- she is followed by [facility] GI for elevated LFT's/fatty liver disease and last seen by GI in 03/2021 for fibroscan.  Screening viral hepatitis and lupus related lab work/negative.   She is off statin. She has a follow up appt with liver clinic on 09/21/2021.              ROS  Constitutional:  Denies  unexpected weight loss or gain, or weakness   Eyes:  Denies visual changes  Respiratory:  Denies cough or shortness of breath. No change in exercise  tolerance  Cardiovascular:  Denies chest pain, palpitations or lower extremity swelling   GI:  Denies abdominal pain, diarrhea, constipation   Musculoskeletal:  Denies myalgias  Skin:  Denies nonhealing lesions  Neurologic:  Denies headache, focal weakness or numbness, tingling  Endocrine:  Denies polyuria or polydypsia   Psychiatric:  Denies depression, anxiety      Outpatient Medications Prior to Visit   Medication Sig Dispense Refill   ??? albuterol HFA 90 mcg/actuation inhaler Inhale 2 puffs every six (6) hours as needed for wheezing.     ??? aluminum-magnesium hydroxide-simethicone 200-200-20 mg/5 mL Susp 80 mL, diphenhydrAMINE 12.5 mg/5 mL Liqd 200 mg, nystatin 100,000 unit/mL Susp 8,000,000 Units, distilled water Liqd 80 mL, lidocaine 2% viscous 2 % Soln 80 mL Take 5 mL by mouth every six (6) hours as needed. Swish, gargle, spit or swallow if throat issues 80 mL 2   ??? buprenorphine-naloxone (SUBOXONE) 2-0.5 mg sublingual film Day 1-Take 1 film (2 mg) under tongue twice a day. Day 2-increase to 1 film (2 mg) every 8 hours (3x/day). Day 3 increase to 2 films (4 mg) twice a day for a 14 day supply. 53 Film 0   ??? capecitabine (XELODA) 500 MG tablet Take 1 tablet (500 mg total) by  mouth two (2) times a day, continuously. 60 tablet 5   ??? clobetasoL (TEMOVATE) 0.05 % ointment Apply twice a day to rash on palm until smooth/clear. 30 g 1   ??? clonazePAM (KLONOPIN) 0.5 MG tablet Take 0.5 mg daily as needed for anxiety.  Do not exceed 0.5 mg/day. 30 tablet 0   ??? diclofenac sodium (VOLTAREN) 1 % gel Apply 2 g topically four (4) times a day as needed for arthritis or pain. 150 g 2   ??? diphenoxylate-atropine (LOMOTIL) 2.5-0.025 mg per tablet Take 1 tablet by mouth 4 (four) times a day as needed for diarrhea. 30 tablet 1   ??? dorzolamide (TRUSOPT) 2 % ophthalmic solution Administer 1 drop into the left eye Three (3) times a day. 10 mL 12   ??? empagliflozin (JARDIANCE) 10 mg tablet Take 1 tablet (10 mg total) by mouth in the morning. 30 tablet 11   ??? esomeprazole (NEXIUM) 40 MG capsule Take 1 capsule (40 mg total) by mouth daily. 90 capsule 3   ??? lidocaine (LIDODERM) 5 % patch Place 1 patch on the skin daily. Apply to affected area for 12 hours only each day (then remove patch) 30 patch 3   ??? MAGNESIUM ORAL Take by mouth daily. OTC     ??? naloxone (NARCAN) 4 mg nasal spray One spray in either nostril once for known/suspected opioid overdose. May repeat every 2-3 minutes in alternating nostril til EMS arrives 2 each 0   ??? ondansetron (ZOFRAN) 4 MG tablet Take 1 tablet (4 mg total) by mouth every six (6) hours as needed for nausea. 30 tablet 1   ??? potassium chloride (KLOR-CON) 10 MEQ CR tablet Take 1 tablet (10 mEq total) by mouth Two (2) times a day. 180 tablet 0   ??? pregabalin (LYRICA) 200 MG capsule Take 1 capsule (200 mg total) by mouth Two (2) times a day. 60 capsule 2   ??? prochlorperazine (COMPAZINE) 10 MG tablet Take 1 tablet (10 mg total) by mouth every six (6) hours as needed for nausea. 30 tablet 6   ??? senna (SENOKOT) 8.6 mg tablet Take 3 tablets by mouth in the morning. 30 tablet 2   ??? thiamine HCl (VITAMIN B-1 ORAL) Take by mouth daily.      ??? traZODone (DESYREL) 50 MG tablet Take 1.5 tablets (75 mg total) by mouth nightly. 45 tablet 1   ??? tucatinib (TUKYSA) 150 mg tablet Take 1 tablet (150 mg total) by mouth two (2) times a day . 60 tablet 1   ??? tucatinib (TUKYSA) 50 mg tablet Take 2 tablets (100 mg total) by mouth two (2) times a day . 120 tablet 3   ??? venlafaxine (EFFEXOR-XR) 150 MG 24 hr capsule Take 150 mg capsule in conjunction with 75 mg capsule for a total of 225 mg by mouth daily 30 capsule 2   ??? venlafaxine (EFFEXOR-XR) 75 MG 24 hr capsule Take 75 mg capsule in conjunction with 150 mg capsule by mouth daily for a total of 225 mg by mouth daily 30 capsule 2   ??? lisinopriL-hydrochlorothiazide (PRINZIDE,ZESTORETIC) 20-25 mg per tablet Take 1 tablet by mouth in the morning. 90 tablet 0   ??? buPROPion (WELLBUTRIN) 75 MG tablet Take 75 mg by mouth Two (2) times a day.     ??? nicotine polacrilex (NICORETTE) 4 MG gum Apply 1 each (4 mg total) to cheek every hour as needed for smoking cessation. (Patient not taking: Reported on 07/08/2021) 110 each 2  No facility-administered medications prior to visit.         Objective:     Total time spent on telephone visit -25 min  Total time spent pre and post visit planning- 10 min    Allergies:     Adhesive, Decadron [dexamethasone], Tetracycline, Oxycodone, and Tegaderm adhesive-no drug-allergy check    Current Medications:     Current Outpatient Medications   Medication Sig Dispense Refill   ??? albuterol HFA 90 mcg/actuation inhaler Inhale 2 puffs every six (6) hours as needed for wheezing.     ??? aluminum-magnesium hydroxide-simethicone 200-200-20 mg/5 mL Susp 80 mL, diphenhydrAMINE 12.5 mg/5 mL Liqd 200 mg, nystatin 100,000 unit/mL Susp 8,000,000 Units, distilled water Liqd 80 mL, lidocaine 2% viscous 2 % Soln 80 mL Take 5 mL by mouth every six (6) hours as needed. Swish, gargle, spit or swallow if throat issues 80 mL 2   ??? buprenorphine-naloxone (SUBOXONE) 2-0.5 mg sublingual film Day 1-Take 1 film (2 mg) under tongue twice a day. Day 2-increase to 1 film (2 mg) every 8 hours (3x/day). Day 3 increase to 2 films (4 mg) twice a day for a 14 day supply. 53 Film 0   ??? capecitabine (XELODA) 500 MG tablet Take 1 tablet (500 mg total) by mouth two (2) times a day, continuously. 60 tablet 5   ??? clobetasoL (TEMOVATE) 0.05 % ointment Apply twice a day to rash on palm until smooth/clear. 30 g 1   ??? clonazePAM (KLONOPIN) 0.5 MG tablet Take 0.5 mg daily as needed for anxiety.  Do not exceed 0.5 mg/day. 30 tablet 0   ??? diclofenac sodium (VOLTAREN) 1 % gel Apply 2 g topically four (4) times a day as needed for arthritis or pain. 150 g 2   ??? diphenoxylate-atropine (LOMOTIL) 2.5-0.025 mg per tablet Take 1 tablet by mouth 4 (four) times a day as needed for diarrhea. 30 tablet 1   ??? dorzolamide (TRUSOPT) 2 % ophthalmic solution Administer 1 drop into the left eye Three (3) times a day. 10 mL 12   ??? empagliflozin (JARDIANCE) 10 mg tablet Take 1 tablet (10 mg total) by mouth in the morning. 30 tablet 11   ??? esomeprazole (NEXIUM) 40 MG capsule Take 1 capsule (40 mg total) by mouth daily. 90 capsule 3   ??? lidocaine (LIDODERM) 5 % patch Place 1 patch on the skin daily. Apply to affected area for 12 hours only each day (then remove patch) 30 patch 3   ??? MAGNESIUM ORAL Take by mouth daily. OTC     ??? naloxone (NARCAN) 4 mg nasal spray One spray in either nostril once for known/suspected opioid overdose. May repeat every 2-3 minutes in alternating nostril til EMS arrives 2 each 0   ??? ondansetron (ZOFRAN) 4 MG tablet Take 1 tablet (4 mg total) by mouth every six (6) hours as needed for nausea. 30 tablet 1   ??? potassium chloride (KLOR-CON) 10 MEQ CR tablet Take 1 tablet (10 mEq total) by mouth Two (2) times a day. 180 tablet 0   ??? pregabalin (LYRICA) 200 MG capsule Take 1 capsule (200 mg total) by mouth Two (2) times a day. 60 capsule 2   ??? prochlorperazine (COMPAZINE) 10 MG tablet Take 1 tablet (10 mg total) by mouth every six (6) hours as needed for nausea. 30 tablet 6   ??? senna (SENOKOT) 8.6 mg tablet Take 3 tablets by mouth in the morning. 30 tablet 2   ??? thiamine HCl (VITAMIN B-1  ORAL) Take by mouth daily.      ??? traZODone (DESYREL) 50 MG tablet Take 1.5 tablets (75 mg total) by mouth nightly. 45 tablet 1   ??? tucatinib (TUKYSA) 150 mg tablet Take 1 tablet (150 mg total) by mouth two (2) times a day . 60 tablet 1   ??? tucatinib (TUKYSA) 50 mg tablet Take 2 tablets (100 mg total) by mouth two (2) times a day . 120 tablet 3   ??? venlafaxine (EFFEXOR-XR) 150 MG 24 hr capsule Take 150 mg capsule in conjunction with 75 mg capsule for a total of 225 mg by mouth daily 30 capsule 2   ??? venlafaxine (EFFEXOR-XR) 75 MG 24 hr capsule Take 75 mg capsule in conjunction with 150 mg capsule by mouth daily for a total of 225 mg by mouth daily 30 capsule 2   ??? buPROPion (WELLBUTRIN) 75 MG tablet Take 75 mg by mouth Two (2) times a day.     ??? lisinopriL-hydrochlorothiazide (PRINZIDE,ZESTORETIC) 20-25 mg per tablet Take 1 tablet by mouth in the morning. 90 tablet 3   ??? nicotine polacrilex (NICORETTE) 4 MG gum Apply 1 each (4 mg total) to cheek every hour as needed for smoking cessation. (Patient not taking: Reported on 07/08/2021) 110 each 2     No current facility-administered medications for this visit.           Note - This record has been created using [employer]. Chart creation errors have been sought, but may not always have been located. Such creation errors do not reflect on the standard of medical care.    [name], MD

## 2021-07-05 NOTE — Unmapped (Signed)
Clinical Assessment Needed For: Dose Change  Medication: Capecitabine 500 mg tablets  Last Fill Date/Day Supply: 06/18/21 / 21  Copay $0  Was previous dose already scheduled to fill: No    Notes to Pharmacist: n/a

## 2021-07-05 NOTE — Unmapped (Signed)
Pt states she would like visit on 7/14 to be changed to telephone encounter if possible  pcp to advise

## 2021-07-06 ENCOUNTER — Ambulatory Visit: Admit: 2021-07-06 | Discharge: 2021-07-06 | Payer: MEDICARE

## 2021-07-06 ENCOUNTER — Institutional Professional Consult (permissible substitution): Admit: 2021-07-06 | Discharge: 2021-07-06 | Payer: MEDICARE

## 2021-07-06 ENCOUNTER — Ambulatory Visit
Admit: 2021-07-06 | Discharge: 2021-07-06 | Payer: MEDICARE | Attending: Geriatric Medicine | Primary: Geriatric Medicine

## 2021-07-06 DIAGNOSIS — Z171 Estrogen receptor negative status [ER-]: Principal | ICD-10-CM

## 2021-07-06 DIAGNOSIS — T451X5A Adverse effect of antineoplastic and immunosuppressive drugs, initial encounter: Principal | ICD-10-CM

## 2021-07-06 DIAGNOSIS — K900 Celiac disease: Principal | ICD-10-CM

## 2021-07-06 DIAGNOSIS — C50919 Malignant neoplasm of unspecified site of unspecified female breast: Principal | ICD-10-CM

## 2021-07-06 DIAGNOSIS — C50811 Malignant neoplasm of overlapping sites of right female breast: Principal | ICD-10-CM

## 2021-07-06 DIAGNOSIS — R110 Nausea: Principal | ICD-10-CM

## 2021-07-06 DIAGNOSIS — R112 Nausea with vomiting, unspecified: Principal | ICD-10-CM

## 2021-07-06 LAB — COMPREHENSIVE METABOLIC PANEL
ALBUMIN: 3.6 g/dL (ref 3.4–5.0)
ALKALINE PHOSPHATASE: 203 U/L — ABNORMAL HIGH (ref 46–116)
ALT (SGPT): 20 U/L (ref 10–49)
ANION GAP: 5 mmol/L (ref 5–14)
AST (SGOT): 39 U/L — ABNORMAL HIGH (ref <=34)
BILIRUBIN TOTAL: 0.6 mg/dL (ref 0.3–1.2)
BLOOD UREA NITROGEN: 9 mg/dL (ref 9–23)
BUN / CREAT RATIO: 15
CALCIUM: 8.5 mg/dL — ABNORMAL LOW (ref 8.7–10.4)
CHLORIDE: 109 mmol/L — ABNORMAL HIGH (ref 98–107)
CO2: 25.4 mmol/L (ref 20.0–31.0)
CREATININE: 0.61 mg/dL
EGFR CKD-EPI (2021) FEMALE: >90 mL/min/{1.73_m2} (ref >=60)
GLUCOSE RANDOM: 109 mg/dL (ref 70–179)
POTASSIUM: 3.1 mmol/L — ABNORMAL LOW (ref 3.4–4.8)
PROTEIN TOTAL: 7.3 g/dL (ref 5.7–8.2)
SODIUM: 139 mmol/L (ref 135–145)

## 2021-07-06 LAB — CBC W/ AUTO DIFF
BASOPHILS ABSOLUTE COUNT: 0 10*9/L (ref 0.0–0.1)
BASOPHILS RELATIVE PERCENT: 0.3 %
EOSINOPHILS ABSOLUTE COUNT: 0 10*9/L (ref 0.0–0.5)
EOSINOPHILS RELATIVE PERCENT: 0.2 %
HEMATOCRIT: 39.7 % (ref 34.0–44.0)
HEMOGLOBIN: 13.3 g/dL (ref 11.3–14.9)
LYMPHOCYTES ABSOLUTE COUNT: 1.8 10*9/L (ref 1.1–3.6)
LYMPHOCYTES RELATIVE PERCENT: 17.7 %
MEAN CORPUSCULAR HEMOGLOBIN CONC: 33.5 g/dL (ref 32.0–36.0)
MEAN CORPUSCULAR HEMOGLOBIN: 34.9 pg — ABNORMAL HIGH (ref 25.9–32.4)
MEAN CORPUSCULAR VOLUME: 104.1 fL — ABNORMAL HIGH (ref 77.6–95.7)
MEAN PLATELET VOLUME: 7.8 fL (ref 6.8–10.7)
MONOCYTES ABSOLUTE COUNT: 0.8 10*9/L (ref 0.3–0.8)
MONOCYTES RELATIVE PERCENT: 8.1 %
NEUTROPHILS ABSOLUTE COUNT: 7.3 10*9/L (ref 1.8–7.8)
NEUTROPHILS RELATIVE PERCENT: 73.7 %
NUCLEATED RED BLOOD CELLS: 0 /100{WBCs} (ref <=4)
PLATELET COUNT: 163 10*9/L (ref 150–450)
RED BLOOD CELL COUNT: 3.81 10*12/L — ABNORMAL LOW (ref 3.95–5.13)
RED CELL DISTRIBUTION WIDTH: 22.8 % — ABNORMAL HIGH (ref 12.2–15.2)
WBC ADJUSTED: 9.9 10*9/L (ref 3.6–11.2)

## 2021-07-06 LAB — CANCER ANTIGEN 27.29: CA 27-29: 59.6 U/mL — ABNORMAL HIGH (ref <=38.6)

## 2021-07-06 MED ORDER — ONDANSETRON HCL 4 MG TABLET
ORAL_TABLET | Freq: Four times a day (QID) | ORAL | 1 refills | 8 days | Status: CP | PRN
Start: 2021-07-06 — End: 2022-07-06

## 2021-07-06 MED ORDER — PROCHLORPERAZINE MALEATE 10 MG TABLET
ORAL_TABLET | Freq: Four times a day (QID) | ORAL | 6 refills | 8.00000 days | Status: CP | PRN
Start: 2021-07-06 — End: ?

## 2021-07-06 MED ORDER — SENNOSIDES 8.6 MG TABLET
ORAL_TABLET | Freq: Every day | ORAL | 2 refills | 10.00000 days | Status: CP
Start: 2021-07-06 — End: ?

## 2021-07-06 MED ADMIN — heparin, porcine (PF) 100 unit/mL injection 500 Units: 500 [IU] | INTRAVENOUS | @ 15:00:00 | Stop: 2021-07-06

## 2021-07-06 MED ADMIN — trastuzumab (HERCEPTIN) 380 mg in sodium chloride (NS) 0.9 % 250 mL IVPB: 6 mg/kg | INTRAVENOUS | @ 15:00:00 | Stop: 2021-07-06

## 2021-07-06 MED ADMIN — sodium chloride (NS) 0.9 % infusion: 100 mL/h | INTRAVENOUS | @ 15:00:00 | Stop: 2021-07-06

## 2021-07-06 NOTE — Unmapped (Signed)
Outpatient Oncology Social Work  Follow Up     [employer] [facility] referral - Follow-Up:  T/C to Kendall West. She states she STILL has not heard from [facility] to schedule her PCS assessment.  Conference T/C with Pt on the line to [employer] at [phone], and spoke with a representative.   Rep confirmed receipt of updated PCS referral by fax from Dr. [name], NP on 06/16/21.  Pt and this SW'er were unable to speak with the scheduler, Juanita, with whom we left a detailed VM msg on 06/25/21, because Dorann Lodge is currently busy.  [employer] Rep is sending Dorann Lodge a message asking her to return Tymara and this SW'ers call today in order to scheduler her PCS eval.     Follow-Up Plan:  Pt has this SW'ers contact information and will reach out for psychosocial assistance as needed.     Claris Gladden, OSW-C  Oncology Outpatient Social Worker  Phone: [phone]

## 2021-07-06 NOTE — Unmapped (Signed)
Bar code for Ondansetron not scanned.  Administered Ondansetron at approximately 9:05am to patient who took sublingual without difficulty.

## 2021-07-06 NOTE — Unmapped (Addendum)
Infusion on 07/06/2021   Component Date Value Ref Range Status    WBC 07/06/2021 9.9  3.6 - 11.2 10*9/L Final    RBC 07/06/2021 3.81 (A) 3.95 - 5.13 10*12/L Final    HGB 07/06/2021 13.3  11.3 - 14.9 g/dL Final    HCT 45/40/9811 39.7  34.0 - 44.0 % Final    MCV 07/06/2021 104.1 (A) 77.6 - 95.7 fL Final    MCH 07/06/2021 34.9 (A) 25.9 - 32.4 pg Final    MCHC 07/06/2021 33.5  32.0 - 36.0 g/dL Final    RDW 91/47/8295 22.8 (A) 12.2 - 15.2 % Final    MPV 07/06/2021 7.8  6.8 - 10.7 fL Final    Platelet 07/06/2021 163  150 - 450 10*9/L Final    nRBC 07/06/2021 0  <=4 /100 WBCs Final    Neutrophils % 07/06/2021 73.7  % Final    Lymphocytes % 07/06/2021 17.7  % Final    Monocytes % 07/06/2021 8.1  % Final    Eosinophils % 07/06/2021 0.2  % Final    Basophils % 07/06/2021 0.3  % Final    Absolute Neutrophils 07/06/2021 7.3  1.8 - 7.8 10*9/L Final    Absolute Lymphocytes 07/06/2021 1.8  1.1 - 3.6 10*9/L Final    Absolute Monocytes 07/06/2021 0.8  0.3 - 0.8 10*9/L Final    Absolute Eosinophils 07/06/2021 0.0  0.0 - 0.5 10*9/L Final    Absolute Basophils 07/06/2021 0.0  0.0 - 0.1 10*9/L Final    Anisocytosis 07/06/2021 Marked (A) Not Present Final     Nausea:      Dr. [name] sent in a new ondansetron prescription for you that are tablets.      Options if you have nausea:  -Ondansetron 4 mg - Take one tablet by mouth ever 8 hours as needed for nausea.  As we discussed this medication can make some people constipated if taking frequently.  You can consider taking the prochlorperazine below if constipation is an issue and the prochlorperazine is not making you too sleepy.    -Prochlorperazine 10 mg - Take one tablet by mouth every 6 hours as needed for nausea.      Constipation:  -keep taking docustate sodium stool softener daily  -continue senna daily  -keep drinking water like you have been doing  -if you need something additional for constipation you can consider taking a capful of Miralax daily mixed in water.

## 2021-07-06 NOTE — Unmapped (Signed)
Outpatient Oncology Social Work  Follow Up     PCS Referral Update:  Brooke [name] reports that she finally got ahold of [facility], and is now scheduled for an in-home evaluation for Personal Care Services on 07/13/21 from 8:30-9:30am.  This SW'er will call in to assist and advocate on Brooke [name]'s behalf during PCS eval in support of her to be approved for home aide services.     Follow-Up Plan:  Pt has this SW'ers contact information and will reach out for psychosocial assistance as needed.     Brooke [name], OSW-C  Oncology Outpatient Social Worker  Phone: [phone]

## 2021-07-06 NOTE — Unmapped (Signed)
Patient arrived in infusion clinic at 1007. Weight and vitals obtained. Port accessed prior to clinic visit. Dressing is clean, dry and intact. Labs ordered and drawn during infusion visit. Patient was administered chemotherapy regimen as ordered without complications while in the clinic. Port was flushed with blood return noted and flushed with heparin prior to deaccessing. Port site covered with gauze and band-aid. Patient given after visit summary and discharged home to self care.

## 2021-07-06 NOTE — Unmapped (Addendum)
It was a pleasure to see you today in the Medical Oncology Clinic.  Please call our nurse navigator, Modena Jansky, if you have any interval questions or concerns:     For appointments, please call [phone]     Nurse Navigator:   Modena Jansky, RN: phone: [phone]     Prescription refills: [phone]     FAX: [phone]     For emergencies, evenings or weekends, please call [phone] and ask for the oncology fellow on call.     Reasons to call emergency line may include:   Fever of 100.5 or greater   Nausea and/or vomiting not relieved with nausea medicine   Diarrhea or constipation not relieved with bowel regimen   Severe pain not relieved with usual pain regimen       Future Appointments   Date Time Provider Department Center   07/06/2021  9:30 AM Summer [address] [place], RN ONCINF [facility]   07/08/2021  2:00 PM [name], MD [id number]   07/15/2021  2:30 PM Maralyn Sago, MD [id number] [facility]   09/21/2021 11:30 AM Janyth Pupa, MD [facility]

## 2021-07-06 NOTE — Unmapped (Addendum)
Clinical Pharmacist Note:    I spoke with Brooke [name] and her sister in infusion today at the request of Dr. [name].      A/P:    1.  Nausea - We spoke extensively about Brooke [name] two nausea options ondansetron and prochlorperazine.  We covered dosing, side effects, and considerations with Brooke [name]'s periodic constipation.  Dr. [name] sent in a new prescription of ondansetron tablets.  The ODT version is not tasting good right now.      2.  Constipation - Per discussion with Dr. [name] Brooke [name] should continue senna and docusate.  Brooke [name] should have refills on both of these now.  Brooke [name] did not indicate that constipation has been a recent problem, but we discussed the use of Miralax if having constipation for several days.  Brooke [name] states she is keeping up with her hydration.      3.  Methadone - Brooke [name] states she has not been able to tolerate and doesn't know if she could restart.  No longer taking.  Palliative care team currently holding Methadone and is currently assessing addition of Suboxone.      4.  Dronabinol - In discussing pain medicine above Brooke [name] expressed interest in a pill form of cannabinoid.  She has smoked for many years, but feels that this could help with her and be a more regulated trustworthy form.  We discussed that some patients have some negative CNS symptoms on it and it is not as good of a nausea drug as what she is currently on. While I was talking to the Brooke [name] about nausea/constipation, twice she came back to this dronabinol topic and seemed confused that I was talking about a different topic.      Approximate time spent with Brooke [name] 20 mins.  No further questions at this time.    Elaina Pattee, PharmD, BCOP, CPP

## 2021-07-08 ENCOUNTER — Institutional Professional Consult (permissible substitution): Admit: 2021-07-08 | Discharge: 2021-07-09 | Payer: MEDICARE

## 2021-07-08 DIAGNOSIS — K760 Fatty (change of) liver, not elsewhere classified: Principal | ICD-10-CM

## 2021-07-08 DIAGNOSIS — C7931 Secondary malignant neoplasm of brain: Principal | ICD-10-CM

## 2021-07-08 DIAGNOSIS — Z720 Tobacco use: Principal | ICD-10-CM

## 2021-07-08 DIAGNOSIS — F3341 Major depressive disorder, recurrent, in partial remission: Principal | ICD-10-CM

## 2021-07-08 DIAGNOSIS — C50919 Malignant neoplasm of unspecified site of unspecified female breast: Principal | ICD-10-CM

## 2021-07-08 DIAGNOSIS — M5030 Other cervical disc degeneration, unspecified cervical region: Principal | ICD-10-CM

## 2021-07-08 DIAGNOSIS — I639 Cerebral infarction, unspecified: Principal | ICD-10-CM

## 2021-07-08 DIAGNOSIS — E785 Hyperlipidemia, unspecified: Principal | ICD-10-CM

## 2021-07-08 DIAGNOSIS — C7802 Secondary malignant neoplasm of left lung: Principal | ICD-10-CM

## 2021-07-08 DIAGNOSIS — I10 Essential (primary) hypertension: Principal | ICD-10-CM

## 2021-07-08 MED ORDER — LISINOPRIL 20 MG-HYDROCHLOROTHIAZIDE 25 MG TABLET
ORAL_TABLET | Freq: Every day | ORAL | 3 refills | 90 days | Status: CP
Start: 2021-07-08 — End: 2022-07-08

## 2021-07-09 DIAGNOSIS — C50811 Malignant neoplasm of overlapping sites of right female breast: Principal | ICD-10-CM

## 2021-07-09 MED ORDER — TUCATINIB 150 MG TABLET
ORAL_TABLET | Freq: Two times a day (BID) | ORAL | 1 refills | 30.00000 days | Status: CP
Start: 2021-07-09 — End: ?
  Filled 2021-07-21: qty 60, 30d supply, fill #0

## 2021-07-09 NOTE — Unmapped (Signed)
[facility] Clinical Assessment & Refill Coordination Note    Brooke [name], DOB: February 18, 1956  Phone: [phone] (home)     All above HIPAA information was verified with patient.     Was a Nurse, learning disability used for this call? No    Specialty Medication(s):   Hematology/Oncology: Capecitabine 500mg , directions: 500mg  2 times a day continously     Current Outpatient Medications   Medication Sig Dispense Refill   ??? albuterol HFA 90 mcg/actuation inhaler Inhale 2 puffs every six (6) hours as needed for wheezing.     ??? aluminum-magnesium hydroxide-simethicone 200-200-20 mg/5 mL Susp 80 mL, diphenhydrAMINE 12.5 mg/5 mL Liqd 200 mg, nystatin 100,000 unit/mL Susp 8,000,000 Units, distilled water Liqd 80 mL, lidocaine 2% viscous 2 % Soln 80 mL Take 5 mL by mouth every six (6) hours as needed. Swish, gargle, spit or swallow if throat issues 80 mL 2   ??? buprenorphine-naloxone (SUBOXONE) 2-0.5 mg sublingual film Day 1-Take 1 film (2 mg) under tongue twice a day. Day 2-increase to 1 film (2 mg) every 8 hours (3x/day). Day 3 increase to 2 films (4 mg) twice a day for a 14 day supply. 53 Film 0   ??? buPROPion (WELLBUTRIN) 75 MG tablet Take 75 mg by mouth Two (2) times a day.     ??? capecitabine (XELODA) 500 MG tablet Take 1 tablet (500 mg total) by mouth two (2) times a day, continuously. 60 tablet 5   ??? clobetasoL (TEMOVATE) 0.05 % ointment Apply twice a day to rash on palm until smooth/clear. 30 g 1   ??? clonazePAM (KLONOPIN) 0.5 MG tablet Take 0.5 mg daily as needed for anxiety.  Do not exceed 0.5 mg/day. 30 tablet 0   ??? diclofenac sodium (VOLTAREN) 1 % gel Apply 2 g topically four (4) times a day as needed for arthritis or pain. 150 g 2   ??? diphenoxylate-atropine (LOMOTIL) 2.5-0.025 mg per tablet Take 1 tablet by mouth 4 (four) times a day as needed for diarrhea. 30 tablet 1   ??? dorzolamide (TRUSOPT) 2 % ophthalmic solution Administer 1 drop into the left eye Three (3) times a day. 10 mL 12   ??? empagliflozin (JARDIANCE) 10 mg tablet Take 1 tablet (10 mg total) by mouth in the morning. 30 tablet 11   ??? esomeprazole (NEXIUM) 40 MG capsule Take 1 capsule (40 mg total) by mouth daily. 90 capsule 3   ??? lidocaine (LIDODERM) 5 % patch Place 1 patch on the skin daily. Apply to affected area for 12 hours only each day (then remove patch) 30 patch 3   ??? lisinopriL-hydrochlorothiazide (PRINZIDE,ZESTORETIC) 20-25 mg per tablet Take 1 tablet by mouth in the morning. 90 tablet 3   ??? MAGNESIUM ORAL Take by mouth daily. OTC     ??? naloxone (NARCAN) 4 mg nasal spray One spray in either nostril once for known/suspected opioid overdose. May repeat every 2-3 minutes in alternating nostril til EMS arrives 2 each 0   ??? nicotine polacrilex (NICORETTE) 4 MG gum Apply 1 each (4 mg total) to cheek every hour as needed for smoking cessation. (Patient not taking: Reported on 07/08/2021) 110 each 2   ??? ondansetron (ZOFRAN) 4 MG tablet Take 1 tablet (4 mg total) by mouth every six (6) hours as needed for nausea. 30 tablet 1   ??? potassium chloride (KLOR-CON) 10 MEQ CR tablet Take 1 tablet (10 mEq total) by mouth Two (2) times a day. 180 tablet 0   ???  pregabalin (LYRICA) 200 MG capsule Take 1 capsule (200 mg total) by mouth Two (2) times a day. 60 capsule 2   ??? prochlorperazine (COMPAZINE) 10 MG tablet Take 1 tablet (10 mg total) by mouth every six (6) hours as needed for nausea. 30 tablet 6   ??? senna (SENOKOT) 8.6 mg tablet Take 3 tablets by mouth in the morning. 30 tablet 2   ??? thiamine HCl (VITAMIN B-1 ORAL) Take by mouth daily.      ??? traZODone (DESYREL) 50 MG tablet Take 1.5 tablets (75 mg total) by mouth nightly. 45 tablet 1   ??? tucatinib (TUKYSA) 150 mg tablet Take 1 tablet (150 mg total) by mouth two (2) times a day . 60 tablet 1   ??? tucatinib (TUKYSA) 50 mg tablet Take 2 tablets (100 mg total) by mouth two (2) times a day . 120 tablet 3   ??? venlafaxine (EFFEXOR-XR) 150 MG 24 hr capsule Take 150 mg capsule in conjunction with 75 mg capsule for a total of 225 mg by mouth daily 30 capsule 2   ??? venlafaxine (EFFEXOR-XR) 75 MG 24 hr capsule Take 75 mg capsule in conjunction with 150 mg capsule by mouth daily for a total of 225 mg by mouth daily 30 capsule 2     No current facility-administered medications for this visit.        Changes to medications: [name] reports no changes at this time.    Allergies   Allergen Reactions   ??? Adhesive Rash   ??? Decadron [Dexamethasone] Anxiety     Mania as well   ??? Tetracycline      Other reaction(s): Other (See Comments)   ??? Oxycodone      Night terrors   ??? Tegaderm Adhesive-No Drug-Allergy Check Rash       Changes to allergies: No    SPECIALTY MEDICATION ADHERENCE     Capecitabine 500 mg: 5 days of medicine on hand     Medication Adherence    Patient reported X missed doses in the last month: 0  Specialty Medication: capecitabine          Specialty medication(s) dose(s) confirmed: dose changed to 500mg  2 times a day continously     Are there any concerns with adherence? No    Adherence counseling provided? Not needed    CLINICAL MANAGEMENT AND INTERVENTION      Clinical Benefit Assessment:    Do you feel the medicine is effective or helping your condition? Yes    Clinical Benefit counseling provided? Not needed    Adverse Effects Assessment:    Are you experiencing any side effects? No    Are you experiencing difficulty administering your medicine? No    Quality of Life Assessment:    Quality of Life      Oncology  1.On a scale of 1-10, rate your pain and/or discomfort you've experienced within the last week.: 1  2.On a scale of 1-10, how has cancer symptoms and/or treatment side effects interfered with your ability to complete your normal daily activities within the last week?: 1  3.Within the last week, how would you rate your feeling of anxiousness and/or depression based on the following options?: None (1)           Have you discussed this with your provider? Not needed    Acute Infection Status:    Acute infections noted within Epic:  No active infections  Patient reported infection: None    Therapy Appropriateness:  Is therapy appropriate? Yes, therapy is appropriate and should be continued    DISEASE/MEDICATION-SPECIFIC INFORMATION      N/A    PATIENT SPECIFIC NEEDS     - Does the patient have any physical, cognitive, or cultural barriers? No    - Is the patient high risk? Yes, patient is taking oral chemotherapy. Appropriateness of therapy as been assessed    - Does the patient require a Care Management Plan? No     - Does the patient require physician intervention or other additional services (i.e. nutrition, smoking cessation, social work)? No      SHIPPING     Specialty Medication(s) to be Shipped:   Hematology/Oncology: Capecitabine 500mg , directions: 1000mg  2 times a day continuosly    Other medication(s) to be shipped: No additional medications requested for fill at this time     Changes to insurance: No    Delivery Scheduled: Yes, Expected medication delivery date: 07/13/21.     Medication will be delivered via Next Day Courier to the confirmed prescription address in [facility]    The patient will receive a drug information handout for each medication shipped and additional FDA Medication Guides as required.  Verified that patient has previously received a Conservation officer, historic buildings and a Surveyor, mining.    The patient or caregiver noted above participated in the development of this care plan and knows that they can request review of or adjustments to the care plan at any time.      All of the patient's questions and concerns have been addressed.    Rollen Sox   [facility] Specialty Pharmacist

## 2021-07-09 NOTE — Unmapped (Signed)
[facility] Clinical Assessment & Refill Coordination Note    Brooke [name], DOB: 04/24/1956  Phone: [phone] (home)     All above HIPAA information was verified with patient.     Was a Nurse, learning disability used for this call? No    Specialty Medication(s):   Hematology/Oncology: Tukysa 150 and 50mg      Current Outpatient Medications   Medication Sig Dispense Refill   ??? albuterol HFA 90 mcg/actuation inhaler Inhale 2 puffs every six (6) hours as needed for wheezing.     ??? aluminum-magnesium hydroxide-simethicone 200-200-20 mg/5 mL Susp 80 mL, diphenhydrAMINE 12.5 mg/5 mL Liqd 200 mg, nystatin 100,000 unit/mL Susp 8,000,000 Units, distilled water Liqd 80 mL, lidocaine 2% viscous 2 % Soln 80 mL Take 5 mL by mouth every six (6) hours as needed. Swish, gargle, spit or swallow if throat issues 80 mL 2   ??? buprenorphine-naloxone (SUBOXONE) 2-0.5 mg sublingual film Day 1-Take 1 film (2 mg) under tongue twice a day. Day 2-increase to 1 film (2 mg) every 8 hours (3x/day). Day 3 increase to 2 films (4 mg) twice a day for a 14 day supply. 53 Film 0   ??? buPROPion (WELLBUTRIN) 75 MG tablet Take 75 mg by mouth Two (2) times a day.     ??? capecitabine (XELODA) 500 MG tablet Take 1 tablet (500 mg total) by mouth two (2) times a day, continuously. 60 tablet 5   ??? clobetasoL (TEMOVATE) 0.05 % ointment Apply twice a day to rash on palm until smooth/clear. 30 g 1   ??? clonazePAM (KLONOPIN) 0.5 MG tablet Take 0.5 mg daily as needed for anxiety.  Do not exceed 0.5 mg/day. 30 tablet 0   ??? diclofenac sodium (VOLTAREN) 1 % gel Apply 2 g topically four (4) times a day as needed for arthritis or pain. 150 g 2   ??? diphenoxylate-atropine (LOMOTIL) 2.5-0.025 mg per tablet Take 1 tablet by mouth 4 (four) times a day as needed for diarrhea. 30 tablet 1   ??? dorzolamide (TRUSOPT) 2 % ophthalmic solution Administer 1 drop into the left eye Three (3) times a day. 10 mL 12   ??? empagliflozin (JARDIANCE) 10 mg tablet Take 1 tablet (10 mg total) by mouth in the morning. 30 tablet 11   ??? esomeprazole (NEXIUM) 40 MG capsule Take 1 capsule (40 mg total) by mouth daily. 90 capsule 3   ??? lidocaine (LIDODERM) 5 % patch Place 1 patch on the skin daily. Apply to affected area for 12 hours only each day (then remove patch) 30 patch 3   ??? lisinopriL-hydrochlorothiazide (PRINZIDE,ZESTORETIC) 20-25 mg per tablet Take 1 tablet by mouth in the morning. 90 tablet 3   ??? MAGNESIUM ORAL Take by mouth daily. OTC     ??? naloxone (NARCAN) 4 mg nasal spray One spray in either nostril once for known/suspected opioid overdose. May repeat every 2-3 minutes in alternating nostril til EMS arrives 2 each 0   ??? nicotine polacrilex (NICORETTE) 4 MG gum Apply 1 each (4 mg total) to cheek every hour as needed for smoking cessation. (Patient not taking: Reported on 07/08/2021) 110 each 2   ??? ondansetron (ZOFRAN) 4 MG tablet Take 1 tablet (4 mg total) by mouth every six (6) hours as needed for nausea. 30 tablet 1   ??? potassium chloride (KLOR-CON) 10 MEQ CR tablet Take 1 tablet (10 mEq total) by mouth Two (2) times a day. 180 tablet 0   ??? pregabalin (LYRICA)  200 MG capsule Take 1 capsule (200 mg total) by mouth Two (2) times a day. 60 capsule 2   ??? prochlorperazine (COMPAZINE) 10 MG tablet Take 1 tablet (10 mg total) by mouth every six (6) hours as needed for nausea. 30 tablet 6   ??? senna (SENOKOT) 8.6 mg tablet Take 3 tablets by mouth in the morning. 30 tablet 2   ??? thiamine HCl (VITAMIN B-1 ORAL) Take by mouth daily.      ??? traZODone (DESYREL) 50 MG tablet Take 1.5 tablets (75 mg total) by mouth nightly. 45 tablet 1   ??? tucatinib (TUKYSA) 150 mg tablet Take 1 tablet (150 mg total) by mouth two (2) times a day . 60 tablet 1   ??? tucatinib (TUKYSA) 50 mg tablet Take 2 tablets (100 mg total) by mouth two (2) times a day . 120 tablet 3   ??? venlafaxine (EFFEXOR-XR) 150 MG 24 hr capsule Take 150 mg capsule in conjunction with 75 mg capsule for a total of 225 mg by mouth daily 30 capsule 2   ??? venlafaxine (EFFEXOR-XR) 75 MG 24 hr capsule Take 75 mg capsule in conjunction with 150 mg capsule by mouth daily for a total of 225 mg by mouth daily 30 capsule 2     No current facility-administered medications for this visit.        Changes to medications: Brooke [name] reports no changes at this time.    Allergies   Allergen Reactions   ??? Adhesive Rash   ??? Decadron [Dexamethasone] Anxiety     Mania as well   ??? Tetracycline      Other reaction(s): Other (See Comments)   ??? Oxycodone      Night terrors   ??? Tegaderm Adhesive-No Drug-Allergy Check Rash       Changes to allergies: No    SPECIALTY MEDICATION ADHERENCE     Tukysa 150 mg: 14 days of medicine on hand   Tukysa 50 mg: 14 days of medicine on hand     Medication Adherence    Patient reported X missed doses in the last month: 0  Specialty Medication: Tukysa 150mg  and 50mg           Specialty medication(s) dose(s) confirmed: Regimen is correct and unchanged.     Are there any concerns with adherence? No    Adherence counseling provided? Not needed    CLINICAL MANAGEMENT AND INTERVENTION      Clinical Benefit Assessment:    Do you feel the medicine is effective or helping your condition? Yes    Clinical Benefit counseling provided? Not needed    Adverse Effects Assessment:    Are you experiencing any side effects? No    Are you experiencing difficulty administering your medicine? No    Quality of Life Assessment:    Quality of Life      Oncology  1.On a scale of 1-10, rate your pain and/or discomfort you've experienced within the last week.: 1  2.On a scale of 1-10, how has cancer symptoms and/or treatment side effects interfered with your ability to complete your normal daily activities within the last week?: 1  3.Within the last week, how would you rate your feeling of anxiousness and/or depression based on the following options?: None (1)              Have you discussed this with your provider? Not needed    Acute Infection Status:    Acute infections noted within Epic:  No active  infections  Patient reported infection: None    Therapy Appropriateness:    Is therapy appropriate? Yes, therapy is appropriate and should be continued    DISEASE/MEDICATION-SPECIFIC INFORMATION      N/A    PATIENT SPECIFIC NEEDS     - Does the patient have any physical, cognitive, or cultural barriers? No    - Is the patient high risk? Yes, patient is taking oral chemotherapy. Appropriateness of therapy as been assessed    - Does the patient require a Care Management Plan? No     - Does the patient require physician intervention or other additional services (i.e. nutrition, smoking cessation, social work)? No      SHIPPING     Specialty Medication(s) to be Shipped:   Hematology/Oncology: [name] 150 and 50mg     Other medication(s) to be shipped: No additional medications requested for fill at this time     Changes to insurance: No    Delivery Scheduled: Yes, Expected medication delivery date: 07/13/21.     Medication will be delivered via Next Day Courier [facility]    The patient will receive a drug information handout for each medication shipped and additional FDA Medication Guides as required.  Verified that patient has previously received a Conservation officer, historic buildings and a Surveyor, mining.    The patient or caregiver noted above participated in the development of this care plan and knows that they can request review of or adjustments to the care plan at any time.      All of the patient's questions and concerns have been addressed.    Rollen Sox   [facility] Specialty Pharmacist

## 2021-07-12 MED FILL — TUKYSA 50 MG TABLET: ORAL | 30 days supply | Qty: 120 | Fill #2

## 2021-07-12 NOTE — Unmapped (Signed)
Radiation Oncology Follow Up Visit Note    Patient Name: Brooke [name]  Patient Age: 65 y.o.  Encounter Date: 07/14/2021    Referring Physician:   [name], MD  [address]  [facility]  [place],  [place] [address]    Primary Care Provider:  [name], MD    Diagnoses:  1. Brain metastases (CMS-HCC)    2. Malignant neoplasm of overlapping sites of right breast in female, estrogen receptor negative (CMS-HCC)        Treatment Site:   7 Brain Mets, 20 Gy x 1 = 20 Gy completed 09/05/2017  R Frontal  L Post Frontal  L Ant Frontal  R Sup Occipital  L Occipital  R Inf Occipital  L Temporal    Lt Med Occipital Met, 20 Gy x 1 = 20 Gy completed 09/13/18    Rt Frontal Met, 5 Gy x 5 = 25 Gy completed 02/28/2019    Interval Since Completion of Treatment: 3 years and 10 months from initial and 2 years and 4 months from most recent      Assessment: Brooke [name] is a 65 y.o. female with metastatic breast cancer, ER-/PR+/Her2+, s/p CK to 7 lesions in September 2018, 1 lesion in 08/2018, resection of a right frontal mass  (01/15/2019) which was previously irradiated but with pathology showing progressive HER2+ breast cancer, completed 25 Gy in 5 fx to right frontal post-op bed on 02/28/2019.      She has been cared for systemically by Dr [name]. Presently she is taking capecitabine 500mg  BID and tucatinib 150mg  BID and trastuzumab q 3 weeks.    Plan:     -Disease Status: stable intracranial disease     -Care Plan: ongoing surveillance of intracranial disease; systemic management per Dr [name]     -Symptoms management: c/o right ear pain; exam consistent with otitis externa, rx'd cortisporin otic     -Follow-up: 4 month with MRI Brain w and wo contrast and clinic visit (was given option of 4 or 6 mo follow up, pt's sister preferred earlier visit)      Interval History:  Brooke [name] returns today for a regularly scheduled follow-up, she was last seen virtually 4 months ago at which time she was doing well.     --Interval History: She returns with her 2 younger sisters today.  She reports right ear pain for several months.  She notes intermittent dizziness.  She has sensory changes around her prior scalp incision and notes that her skull is slightly indented.    She has general mental fogginess at times but enjoys spending her time experimenting with cooking. 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    No new focal neurologic symptoms.  No seizure activity. No new or worsening headaches. No vision changes. No hearing or taste changes.  No weakness of either extremity. No numbness or tingling.      Review of Systems: All other systems reviewed are negative. Pertinent positives and negatives are above in interval history.    Past Medical, Surgical, Family and Social Histories reviewed and updated in the electronic medical record.    Oncology History Overview Note   Identifying Statement:  Brooke [name] is a 65 y.o. female diagnosed with a right posterior frontal lobe metastasis (breast primary) status post resection 01/15/2019 and 5/5 fractions of SBRT (2500 Gy via CyberKnife) to the resection cavity.    Treatment History:  09/05/17: S/p SRS to 7  lesions  01/15/19: S/p resection, followed by SRS 2500 cGy   03/04/19: KPS 80; MRI with postsurgical changes versus residual disease; RTC in 2 weeks w/ MRI   04/15/19: KPS NA; MRI w/ SD; no study; RTC 6 weeks w/ MRI   06/10/19: KPS 80; MRI w/ SD; RTC 4 weeks  07/15/19: KPS 80; MRI w/ SD; RTC 8 weeks  08/19/19: KPS 80; start nortriptyline for HAs; RTC 4 weeks w/ MRI  09/16/19: KPS 80; con't nortriptyline for HAs; RTC 2 mos w/ MRI  11/11/19: KPS 80; MRI w/ SD though w/ small infarct; proceed w/ CVA workup; start 81mg  ASA; encouraged smoking cessation; con't nortriptyline; RTC to discuss results  11/25/19: KPS 80; discussed CVA workup results; MRA unremarkable; con't smoking cessation efforts; con't nortriptyline for HAs; con't 81mg  ASA, start Lipitor; f/u with PCP for COPD eval; RTC 2 mos w/ MRI     Malignant neoplasm of overlapping sites of right female breast (CMS-HCC)   2017 -  Presenting Symptoms    Large open wound RT breast which began as nipple inversion. Patient states that she ignored for a long time.  Physical exam of the area of concern in the RTbreast demonstrates a large open wound in the lateral right breast. Saw PCP 01/27/16 Rx Keflex.     02/04/2016 Interval Scan(s)    MMG/US: 3.1 (3.0) cm irregular spiculated dense mass in lateral Rt breast w nipple and skin retraction. 2 subcentimeter irregular masses in the RUOQ  (10', 11') (possible satellite lesions). Large Rt axillary lymph node 1.7. Lt breast clear. BIRAD 5.     02/08/2016 Biopsy    US guided Rt. Axillary LN biopsy:  Gr 3, IDC with apocrine features. ER (-), PR (31-40), HER-2 (3+). Noted to have multiple skin lesions. Poor access to breast mass due open 10-15 cm wound risk of pain and bleeding.     02/09/2016 Initial Diagnosis    Malignant neoplasm of overlapping sites of right female breast (RAF-HCC)     02/10/2016 Interval Scan(s)    CT CAP: Large, necrotic right breast mass with wide open tract to the skin, consistent with known right breast malignancy.  Numerous enlarged right axillary, subpectoral, mediastinal, bilateral hilar lymph nodes and innumerable bilateral pulmonary nodules     02/10/2016 Interval Scan(s)    NM Bone scan: No osseous metastatic disease.     04/28/2016 - 03/10/2020 Chemotherapy    OP TRASTUZUMAB (EVERY 21 DAYS)  Trastuzumab 8 mg/kg loading then 6 mg/kg every 21 days     07/13/2016 Genetics    STRATA   ERBB2 copy number alteration  Estimated copy number: 40, confidence interval: 35.7 - 44.0, cellularity: 50%  Associated FDA-approved targeted therapies in breast cancer: trastuzumab, lapatinib, ado-trastuzumab emtansine, pertuzumab   PIK3CA p.E545A     01/03/2020 -  Cancer Staged    CT CAP 01/03/2020 which showed a new lingular opacity measuring 1.9 cm in the right lung. There was also slight increase in the right breast mass from 1.2-> 1.5 cm.  Bone scan shows no osseous metastases. Overall I considered these findings indeterminate and ppted to repeat CT CAP in 1 month     02/03/2020 -  Cancer Staged    CT chest 02/03/20 which showed that the lingular lesion has persisted and increased in size.  No mediastinal adenopathy reported.  She continues to report a nonproductive cough and mild dyspnea.  She is a chronic smoker and currently smokes on average half a pack per day.  My  major concern is that this could be a second primary lung cancer as her other sites of diseases stable on current systemic therapy       02/03/2020 Endocrine/Hormone Therapy    Stop Tamoxifen, Add Fasoldex, continue Q3week Hercpetin     03/23/2020 - 02/09/2021 Chemotherapy    OP BREAST ADO-TRASTUZUMAB EMTANSINE  ado-trastuzumab emtansine 3.6 mg/kg IV on day 1, every 21 days     04/12/2021 -  Chemotherapy    Tucatinib + Capecitabine  OP TRASTUZUMAB (EVERY 21 DAYS)  trastuzumab 8 mg/kg IV LOADING, then 6 mg/kg IV MAINT, every 21 days     Brain metastases (CMS-HCC)   08/07/2017 Initial Diagnosis    Brain metastases (CMS-HCC)    Summary of Radiosurgery   Rx:7 brain met: 09/05/2017: 2,000/2,000 cGy  Sec:R Frontal: 09/05/2017: 2,000 cGy  Sec:L Post Fr: 09/05/2017: 2,000 cGy  Sec:L Ant Fro: 09/05/2017: 2,000 cGy  Sec:R Sup Oc: 09/05/2017: 2,000 cGy  Sec:L Occipita: 09/05/2017: 2,000 cGy  Sec:R Inf Occi: 09/05/2017: 2,000 cGy  Sec:L Tempor: 09/05/2017: 2,000 cGy  Rx:Lt Med Oc: 09/13/2018: 2,000/2,000 cGy  Rx:Rt Frontal: : 2,500/2,500 cGy    01/15/2019: Craniotomy and resection of right posterior frontal lobe metastasis. Followed by CK    03/04/19: KPS 80; MRI with postsurgical changes versus residual disease; RTC in 2 weeks w/ MRI     01/03/2020 -  Cancer Staged    CT CAP 01/03/2020 which showed a new lingular opacity measuring 1.9 cm in the right lung. There was also slight increase in the right breast mass from 1.2-> 1.5 cm.  Bone scan shows no osseous metastases. Overall I considered these findings indeterminate and ppted to repeat CT CAP in 1 month     02/03/2020 -  Cancer Staged    CT chest 02/03/20 which showed that the lingular lesion has persisted and increased in size.  No mediastinal adenopathy reported.  She continues to report a nonproductive cough and mild dyspnea.  She is a chronic smoker and currently smokes on average half a pack per day.  My major concern is that this could be a second primary lung cancer as her other sites of diseases stable on current systemic therapy       02/03/2020 Endocrine/Hormone Therapy    Stop Tamoxifen, Add Fasoldex, continue Q3week Hercpetin     Metastatic breast cancer (CMS-HCC)   01/25/2019 Initial Diagnosis    Metastatic breast cancer (CMS-HCC)     03/23/2020 - 02/09/2021 Chemotherapy    OP BREAST ADO-TRASTUZUMAB EMTANSINE  ado-trastuzumab emtansine 3.6 mg/kg IV on day 1, every 21 days     04/12/2021 -  Chemotherapy    Tucatinib + Capecitabine  OP TRASTUZUMAB (EVERY 21 DAYS)  trastuzumab 8 mg/kg IV LOADING, then 6 mg/kg IV MAINT, every 21 days           Physical Exam:  Vital Signs for this encounter:  Temp 36.4 ??C (97.5 ??F) (Temporal)  - Wt 57.8 kg (127 lb 8 oz)  - LMP  (LMP Unknown)  - BMI 23.17 kg/m??   Last weight:    Wt Readings from Last 4 Encounters:   07/14/21 57.8 kg (127 lb 8 oz)   07/06/21 57 kg (125 lb 10.6 oz)   07/06/21 57.3 kg (126 lb 4.8 oz)   07/01/21 58.4 kg (128 lb 12.8 oz)     ECOG: 90,  Able to carry on normal activity; minor signs or symptoms of disease (ECOG equivalent 0)  General/Constitutional: Well-appearing, NAD   HEENT:  Normocephalic, atraumatic, no scleral icterus   Skin: No suspicious lesions or rashes  Pulmonary: No respiratory distress or increased work of breathing   Abdominal: Non distended  Musculoskeletal: Full range of motion in the extremities, without edema   Neurologic: Alert and oriented to conversation.  CN II-XII grossly intact.  Strength and sensation intact bilaterally.  Psychiatric: Appropriate affect and judgement        Radiology  Results for orders placed during the hospital encounter of 07/14/21    MRI Brain W Wo Contrast    Impression  Unchanged small left frontal lobe enhancing lesion. No new intracranial enhancing lesions.    Stable postoperative changes from prior right frontal craniectomy.          Electronically signed by:    Sydnee Levans, PA-C  Department of Radiation Oncology  [facility]  [address], [address]  [place], [place] [address]  O: [phone]  07/14/21 11:21 AM          ATTENDING ATTESTATION:    I discussed the findings, assessment, and plan of care with the physician assistant.  I agree with the findings and plan as documented in the physician assistant's note.      I personally reviewed all relevant data associated with this encounter including imaging, labwork, and prior records.      Mina Marble, MD, PhD  Assistant Professor  Department of Radiation Oncology  [facility]  [address], [address]  [place], [place] [address]  O: [phone]  P: [phone]

## 2021-07-12 NOTE — Unmapped (Signed)
[name] 's Tukysa 150mg  shipment will be delayed as a result of the medication is too soon to refill until 7/20.     I have reached out to the patient  at [phone] VIA TEXT and communicated the delay. We will wait for a call back from the patient to reschedule the delivery.  We have not confirmed the new delivery date.

## 2021-07-13 NOTE — Unmapped (Signed)
Outpatient Oncology Social Work  Follow Up       Personal Aide Update:  This SW'er participated  (by telephone) in the [employer] assessment taking place in person in her home.  Assisted Brooke [name] to explain her difficulties with eating (meal prep) related to having cut and burned herself in the kitchen.  Also, Brooke [name] needs help with bathing/mobility (she has fallen several times in the past few months and is fearful of falling in the shower, despite having a handrail and shower chair). She needs someone to be present while showering in order to prevent a fall.    Brooke [name] expresses a preference for [facility] out of [place] - and Brooke [name] is requesting assistance 3 days per week.  Evaluator is going to spend some additional time viewing Brooke [name] move through her ADL's around the home in order to determine if she could also benefit from additional areas of assistance.     Follow-Up Plan:  Pt has this SW'ers contact information and will reach out for psychosocial assistance as needed.     Claris Gladden, OSW-C  Oncology Outpatient Social Worker  Phone: [phone]

## 2021-07-13 NOTE — Unmapped (Incomplete)
[facility]  Comprehensive Cancer Support Program/Psychiatry   Established Patient E&M Service - Outpatient     Encounter Description: in person    Name: Brooke [name]  Date: 07/13/2021  MRN: [id number]  DOB: [birth year]    Assessment:  Brooke [name] presents for follow-up evaluation for depression and anxiety. In the interim since last appointment, she has had an MRI brain showing no changes, and was started on suboxone for pain but self-discontinued it due to confusion. She is also getting assessed for home health    She reports that her mood is stable, and her sister Brooke [name] thinks if anything her irritability is a little improved. She still sleeps a fair amount, and her main concern right now is hot flashes/night sweats that have worsened over the past week. To help with hot flashes, we will increase Effexor to 300mg  daily. She and her sister plan to drop by the [facility] office to ask about gas cards, and we have also sent a message to her social worker Brooke [name] to follow up. Brooke [name] has upcoming imaging next week, and we will wait until those results to schedule our next appointment.    Identifying Information:  Brooke [name] is a 65 y.o. 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 Last treatment in 2020 (radiation to right frontal brain met post-op bed). She continues on capecitabine (pyrimidine analog) 500mg  BID, tucatinib (anti HER-2)150mg  BID, trastuzumab (anti-HER2) q3 weeks.    Risk Assessment:  An assessment of suicide and violence risk factors was performed as part of this evaluation and is not significantly changed from the last visit.   While future psychiatric events cannot be accurately predicted, the patient does not currently require acute inpatient psychiatric care and does not currently meet [facility] involuntary commitment criteria.      Plan:  Problem 1: Major depressive disorder, recurrent episode   Status of problem: chronic and stable  Interventions:   - Increase Effexor XR to 300mg  PO daily to target mood, anxiety, and hot flashes.    - Previously established with [facility] psychotherapist [facility]. Patient will reach out again.     Problem 2: Unspecified anxiety disorder  Status of problem: chronic and stable  Interventions:   - Continue Klonopin 0.5mg  qday PRN anxiety (prescribed #30 on 04/29/21). She has historically used this medication very sparingly and continues to do so (reportedly about 2x/week)  - Discussed risk/benefit again with her on 11/12/20 and 02/03/21, in particular focusing on potential of benzodiazepines to cause or worsen cognitive deficits. On 03/11/21, strongly discouraged her from taking klonopin unless absolutely necessary for intense anxiety    Problem 3: Insomnia  Status of problem: chronic and stable  Interventions:   - Continue Trazodone 75 mg nightly prn insomnia    Problem 4: Potential Sleep Apnea  Status of problem: Stable  Interventions:  - Ambulatory referral to Sleep center was placed in 10/2020 (still pending as of 06/03/21)    Problem 5: Mild Neurocognitive Disorder  Status of problem: stable. MOCA 20/30 on 02/03/21  Interventions:  - Continue to monitor  - Recommended pill organizer, alarms on phone and schedule/spreadsheet where she can mark doses once she has taken them  - Case manager will be present for next eval by [facility] as they initially denied Brooke [name] request for home  health aid due to feeling as though she did not need one.     Follow-up: TBD after scan results      Psychotherapy provided:  No billable psychotherapy service provided.    Patient has been given this writer's contact information as well as the [facility] urgent line number. The patient has been instructed to call 911 for emergencies.    Patient was seen and plan of care was discussed with the Attending MD,Dr. Lawernce Keas, who agrees with the above statement and plan.      Subjective:    Chief complaint:  Follow-up psychiatric evaluation for anxiety and depression    Interval History:   MRI brain 7/12 showed unchanged small left frontal lobe enhancing lesion. No new intracranial enhancing lesions. Stable postoperative changes from prior right frontal craniectomy. Started suboxone with primary care. Labs 7/12 show hypokalemia, normal creatinine, macrocytosis without anemia, slightly high AST, elevated ALP. Getting assessed for home health.    She says she is frustrated with hot flashes every night, wakes up with hair wet, covered in sweat. On Effexor originally for hot flashes. This reminds her of how her cancer started. She was started on ear drops yesterday for infection, also started on suboxone in early July. She really didn't like suboxone because it made her loopy for 3 days, even though she only used part of a film. She didn't take it again.     Her mood ranges from fine to frustrated. Wants to get things treated immediately, acknowledges she is impatient. Anger issues, cussing people out, walk out. Sister reports that thinks this has actually gotten a little bit better.     Shopping helps her mood, gets to go once a month. She also likes to do lots of cooking, describes lots of her favorite recipes. She enjoys watching TV as well. She lives on her own and prefers is that way because she is set in her own ways. She is working on home health and will be happy if she gets it. Will be a good thing, will help with irritability because back pain gets in the way of doing normal tasks    Used to see [facility]- interested in talking to her again.   Clonazepam uses once a day at maximum. Feels more calm, takes before scans.  Sleep: taking trazodone everynight. Gets tired easily. Sleeps at 9pm, but sometimes sometimes earlier. Gets up 5am, back to bednot really rested. No sleep apnea    Appetite 2-3 meals a day. Lost weight recently on purpose gained weight during covid.     Alcohol- quit but very rarely glass of wine with dinner. 2 months  Marijuana: night time before, relaxing and helps with pain. One bowl. Smoked since 65 years old  No SI, sometime s thinks about hurting others.   Brooke [name] is helping her be more patint  Depression is not eating enough or too much, sadness and crying in bed. Racing thoughts  Taking effexor 225mg   Tobacco .75 pack     Next week has breast cancer scan. Worries it will be worse  Sister: cognitive status slowly gotten worse      ***other doc wanted her to start wellbutrin for depr/anxiety but   Objective:      Mental Status Exam:  Appearance:    NA telephone***   Motor:   NA telephone   Speech/Language:    Normal rate, volume, tone, fluency and Language intact, well formed   Mood:   up and  down   Affect:   NA telephone   Thought process and Associations:   Logical, linear, clear, coherent, goal directed   Abnormal/psychotic thought content:     Denied SI, denied HI. No evidence of paranoia or delusions.   Perceptual disturbances:     No AVH     Memory/recall  Historically poor   Attention  Able to concentrate and attend to conversation but not formally tested   Orientation  Did not formally assess but seems grossly oriented   Other:   Insight and judgment fair.  Fair impulse control.       Visit was completed by video (or phone) and the appropriate disclaimer has been included below.     {    Coding tips - Do not edit this text, it will delete upon signing of note!    ?? Telephone visits [phone] for Physicians and APP??s and [phone] for Non- Physician Clinicians)- Only use minutes on the phone to determine level of service.    ?? Video visits [phone]) - Use both minutes on video and pre/post minutes to determine level of service.       :[id number]}    The patient reports they are currently: {patient location:81390}. I spent *** minutes on the {phone audio video visit:67489} with the patient on the date of service. I spent an additional *** minutes on pre- and post-visit activities on the date of service.     The patient was physically located in [place] or a state in which I am permitted to provide care. The patient and/or parent/guardian understood that s/he may incur co-pays and cost sharing, and agreed to the telemedicine visit. The visit was reasonable and appropriate under the circumstances given the patient's presentation at the time.    The patient and/or parent/guardian has been advised of the potential risks and limitations of this mode of treatment (including, but not limited to, the absence of in-person examination) and has agreed to be treated using telemedicine. The patient's/patient's family's questions regarding telemedicine have been answered.     If the visit was completed in an ambulatory setting, the patient and/or parent/guardian has also been advised to contact their provider???s office for worsening conditions, and seek emergency medical treatment and/or call 911 if the patient deems either necessary.      Maralyn Sago, MD

## 2021-07-14 ENCOUNTER — Ambulatory Visit
Admit: 2021-07-14 | Discharge: 2021-07-25 | Payer: MEDICARE | Attending: Radiation Oncology | Primary: Radiation Oncology

## 2021-07-14 ENCOUNTER — Ambulatory Visit: Admit: 2021-07-14 | Discharge: 2021-07-15 | Payer: MEDICARE

## 2021-07-14 MED ORDER — NEOMYCIN-POLYMYXIN-HYDROCORTISONE OTIC SOLN T-HOME
Freq: Three times a day (TID) | OTIC | 0 refills | 10 days | Status: CP
Start: 2021-07-14 — End: 2021-07-24

## 2021-07-14 MED ADMIN — gadobenate dimeglumine (MULTIHANCE) 529 mg/mL (0.1mmol/0.2mL) solution 10 mL: 10 mL | INTRAVENOUS | @ 12:00:00 | Stop: 2021-07-14

## 2021-07-14 NOTE — Unmapped (Addendum)
Pt. Here for follow up.  Prior tx. For breast ca. Pt. Had MRI done here this am. Pt. Denies pain at breast area, but does have rt. Ear pain.  This varies. Plans to see MD.  Leanora Ivanoff Suboxone pain strip.  This helped her pain.  Appetite good.  Energy as a rule is good. Didn't sleep last night due to ear pain. On Xeloda.  Had diarrhea when first started, but now she is good.

## 2021-07-15 ENCOUNTER — Ambulatory Visit
Admit: 2021-07-15 | Discharge: 2021-07-16 | Payer: MEDICARE | Attending: Student in an Organized Health Care Education/Training Program | Primary: Student in an Organized Health Care Education/Training Program

## 2021-07-15 MED ORDER — VENLAFAXINE ER 150 MG CAPSULE,EXTENDED RELEASE 24 HR
ORAL_CAPSULE | Freq: Every day | ORAL | 2 refills | 30 days | Status: CP
Start: 2021-07-15 — End: ?

## 2021-07-16 MED ORDER — NEOMYCIN-POLYMYXIN-HYDROCORTISONE OTIC SOLN T-HOME
Freq: Three times a day (TID) | OTIC | 0 refills | 10.00000 days | Status: CP
Start: 2021-07-16 — End: 2021-07-26

## 2021-07-16 NOTE — Unmapped (Signed)
Hi,     Brooke [name] contacted the Communication [facility] requesting to speak with the care team of [name] to discuss:    Patient reports that Dr. Flonnie Hailstone prescribed drops for an ear infection. Patient has lost medication, requested a new prescription be sent to [place] in [place].    Please contact [name] at [phone].    Thank you,   Kelli Hope  [facility]   [phone]

## 2021-07-17 ENCOUNTER — Ambulatory Visit
Admit: 2021-07-17 | Discharge: 2021-07-18 | Disposition: A | Discharge status: Home / Self Care | Payer: MEDICARE | Attending: Emergency Medicine

## 2021-07-17 ENCOUNTER — Emergency Department
Admit: 2021-07-17 | Discharge: 2021-07-18 | Disposition: A | Discharge status: Home / Self Care | Payer: MEDICARE | Attending: Emergency Medicine

## 2021-07-17 NOTE — Unmapped (Signed)
Heme/Onc Phone Triage Note    Caller: Sister    Reason for Call: COVID-19    Assessment and Plan:    She tested positive for COVID today and was seen in an urgent care where they told her she had a pneumonia in her lungs.    On reviewing her chart, she has cirrhosis of the liver which complicates what medications can be administered.    Additionally, the sister states that her oxygen was in the low 90's and she looks terrible. She also has lost 3 pounds because she can't keep anything down for a week now.    Due to decline in functional status and concerns that she cannot easily get monoclonal antibody infusions over the weekend through infusion, I recommend she present to ED for further evaluation and possible admission.    - recommend she present to nearest ED for further evaluation and possible admission for monoclonal antibody administration    Lizabeth Leyden, PGY-6  Hematology-Oncology Fellow

## 2021-07-18 MED ORDER — NIRMATRELVIR 300 MG (150 MG X 2)-RITONAVIR 100 MG TABLET (EUA)
ORAL_TABLET | Freq: Two times a day (BID) | ORAL | 0 refills | 5.00000 days | Status: CP
Start: 2021-07-18 — End: 2021-07-23

## 2021-07-18 MED ADMIN — pregabalin (LYRICA) capsule 200 mg: 200 mg | ORAL | @ 06:00:00 | Stop: 2021-07-18

## 2021-07-18 MED ADMIN — ondansetron (ZOFRAN-ODT) disintegrating tablet 4 mg: 4 mg | ORAL | @ 06:00:00 | Stop: 2021-07-18

## 2021-07-18 NOTE — Unmapped (Signed)
Hematology/Oncology Outpatient Call    Caller: Sister  Primary oncologist: Brooke [name] is a 65 y.o. w/ a pmhx of metastatic breast cancer    Reason for Call:   COVID-19. Pt is waiting in the [facility] ED to be evaluated and is worried because she cannot take her medications.    Assessment/Plan:   I advised the patient's sister that the patient should stay in the [facility] ED waiting room to be evaluated for covid.     Fellow taking call:  Carlynn Herald  July 17, 2021 10:08 PM

## 2021-07-18 NOTE — Unmapped (Signed)
Patient presenting with cough and congestion, she tested positive for COVID today and her oncologist is concerned about her inability to handle PO.

## 2021-07-19 MED ORDER — NEOMYCIN-POLYMYXIN-HYDROCORTISONE OTIC SOLN T-HOME
Freq: Three times a day (TID) | OTIC | 0 refills | 10 days | Status: CP
Start: 2021-07-19 — End: 2021-07-29

## 2021-07-19 MED ORDER — PAXLOVID 300 MG (150 MG X 2)-100 MG TABLET (EUA)
ORAL_TABLET | 0 refills | 0 days | Status: CP
Start: 2021-07-19 — End: ?

## 2021-07-19 NOTE — Unmapped (Signed)
Seen at [facility] ED and was prescribed Paxlovid.   Spoke with sister who confirmed she was prescribed Paxlovid and they will pick it up today.

## 2021-07-19 NOTE — Unmapped (Signed)
Pharmacist Phone Follow-Up    Cancer Team  Medical Oncology: Dr. [name]  Reason for call: Oral chemotherapy management  Current treatment: Trastuzumab + tucatininb + capecitabine     Breast cancer:  Brooke [name] is a 65 yo woman with HER2+ metastatic breast cancer who started trastuzumab, tucatinib, and capecitabine on 4/18.      She was directed to go to the ED because she was Covid positive and had respiratory symptoms, fever, and weight loss.  She was seen in the ED and sent home with a prescription for Paxlovid.    She called me today for follow-up on on her oral chemotherapy medications.  She has not been able to get the Paxlovid but plans to start this evening.  I recommended that she hold capecitabine but continue tucatinib while on Paxlovid.  After completing her Paxlovid if her symptoms have improved and she is not having any fevers, she can restart the capecitabine.    Oral chemotherapy regimen:??   Starting 7/12:   Tucatinib 250 mg BID   ????????????????????????Capecitabine 500 mg BID, continuously (metronomic dosing to improve adherence)  ????????????????????????Trastuzumab IV q 3weeks     Starting 5/30 doses reduced to:   Tucatinib 250 mg BID (started on 6/5) --> she took 150 mg po BID (5/30-6/4) then increased on 6/5 to 250 mg after receiving 50  mg tabs from the pharmacy  ????????????????????????Capecitabine 1000 mg BID x 14 days then 7 days off  ????????????????????????Trastuzumab IV q 3weeks     Started 04/12/21:  ????????????????????????Tucatinib 300 mg po BID continuous  ????????????????????????Capecitabine 1500 mg BID x 14 days then 7 days off (stopped during first cycle d/t diarrhea)  ????????????????????????Trastuzumab IV q 3weeks  Start date: 04/12/21  Pharmacy: [facility]     Patient verbalized understanding to the new plan.  Approximate time spent on phone with patient: 10 minutes

## 2021-07-19 NOTE — Unmapped (Signed)
[facility] Emergency Department Provider Note    Patient Identification  Brooke [name]  Patient information was obtained from patient.  History/Exam limitations: none.    ED Clinical Impression     Final diagnoses:   COVID (Primary)     Initial Impression, ED Course, Assessment and Plan     Impression: 65 year old female with history noted below who is presenting for evaluation of COVID-like symptoms.  Her vital signs are currently stable and reassuring, pulse ox is reassuring.  She is in no acute distress, and does not have any apparent respiratory distress at this time.  Patient reports being discouraged about how long she has been here in the ED, requesting to eat.  She is very hungry.  Will p.o. challenge here.  Did discuss that on outside read they noted something in her lingula lobe that could be concerning for infection.  Patient self reports having a lesion in that area for some time.  Will obtain repeat x-ray to ensure no new finding.  She declines blood at this time.  Very eager to go home.    X-ray overall is reassuring, no new acute findings.  Did discuss with our in-house pharmacist, patient does not have any obvious contraindications to paxlovid with her medications.  Prescription has been sent.  Patient requesting to go home.  Stricter precautions given.  Continues to have a normal respiratory status.      Additional Medical Decision Making     I independently visualized the EKG tracing.   I independently visualized the radiology images.   I reviewed the patient's prior medical records.     Any labs and radiology results that were available during my care of the patient were independently reviewed by me and considered in my medical decision making.    Portions of this record have been created using Scientist, clinical (histocompatibility and immunogenetics). Dictation errors have been sought, but may not have been identified and corrected.  ____________________________________________    I have reviewed the triage vital signs and the nursing notes.      Patient History     Chief Complaint  Cough      HPI   Brooke [name] is a 65 y.o. female history of metastatic breast cancer, undergoing treatment who is presenting for evaluation covid infection.  Patient reports mild URI symptoms since about Wednesday.  Tested positive on home COVID test today.  Recommend coming in for further evaluation given sister was concerned about patient and possible low pulse ox at home.  Patient reports congestion, mild cough.  No hemoptysis.  Reports at baseline shortness of breath that has been remained unchanged for months.  No new shortness of breath with covid.  Has some chronic nausea which does decrease her appetite.  However she is currently very hungry right now and requesting to eat.  Also endorsing diarrhea, though she reports this happens with chemo treatment as well.  She is on oral medication as well as infusions.  Next infusion is in roughly over a week.    Past Medical History:   Diagnosis Date   ??? Abnormal ECG unsure   ??? Alcoholism (CMS-HCC)    ??? Allergic rhinitis    ??? Anemia    ??? Anxiety     insomnia   ??? Arthritis not sure   ??? Brain concussion    ??? Breast cancer (CMS-HCC)     chemo for now with mets   ??? COPD (chronic obstructive pulmonary disease) (CMS-HCC)    ???  Depression    ??? Dysphagia    ??? Fatty liver 07/08/2021   ??? Genitourinary disease    ??? GERD (gastroesophageal reflux disease)    ??? Headache    ??? HTN (hypertension)    ??? Hyperlipidemia    ??? Insomnia    ??? 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 Stroke (CMS-HCC) Nov. 2020    Dr. Theodoro Kalata   ??? Tobacco dependence        Patient Active Problem List   Diagnosis   ??? Malignant neoplasm of overlapping sites of right female breast (CMS-HCC)   ??? Peripheral neuropathy   ??? Insomnia   ??? Brain metastases (CMS-HCC)   ??? Nausea   ??? Closed fracture of one rib with routine healing   ??? Diarrhea of presumed infectious origin   ??? Failure to thrive in adult   ??? Tobacco use   ??? Chronic diarrhea   ??? Depression   ??? Hyponatremia   ??? Back pain   ??? Closed fracture of multiple pubic rami, right, initial encounter (CMS-HCC)   ??? Macrocytosis   ??? Pelvic fracture (CMS-HCC)   ??? Metastatic breast cancer (CMS-HCC)   ??? Migraine without aura and without status migrainosus, not intractable   ??? Essential hypertension   ??? CVA (cerebral vascular accident) (CMS-HCC)   ??? DDD (degenerative disc disease), cervical   ??? Wheezing   ??? Dyslipidemia   ??? Angina pectoris (CMS-HCC)   ??? Papillary fibroelastoma of heart   ??? Antineoplastic chemotherapy induced anemia   ??? Celiac disease   ??? Iron deficiency anemia due to chronic blood loss   ??? Dry eye syndrome, bilateral   ??? Meibomian gland dysfunction (MGD) of both eyes   ??? Glaucoma suspect of both eyes   ??? Incipient cataract of both eyes   ??? 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 Fatty liver       Past Surgical History:   Procedure Laterality Date   ??? CESAREAN SECTION     ??? CHOLECYSTECTOMY     ??? FINGER SURGERY Right     trigger finger   ??? GANGLION CYST EXCISION Left     hand   ??? HYSTERECTOMY     ??? LYMPH NODE BIOPSY Right 02/08/2016    Axillary   ??? PR BRNSCHSC TNDSC EBUS DX/TX INTERVENTION PERPH LES Left 03/05/2020    Procedure: Bronch, Rigid Or Flexible, Including Fluoro Guidance, When Performed; With Transendoscopic Ebus During Bronchoscopic Diagnostic Or Therapeutic Intervention(S) For Peripheral Lesion(S);  Surgeon: Jerelyn Charles, MD;  Location: MAIN OR [facility];  Service: Pulmonary   ??? PR BRONCHOSCOPY,COMPUTER ASSIST/IMAGE-GUIDED NAVIGATION Left 03/05/2020    Procedure: BRONCHOSCOPY, RIGID OR FLEXIBLE, INCLUDE FLUORO WHEN PERFORMED; W/COMPUTER-ASSIST, IMAGE-GUIDED NAVIGATION;  Surgeon: Jerelyn Charles, MD;  Location: MAIN OR [facility];  Service: Pulmonary   ??? PR BRONCHOSCOPY,DIAGNOSTIC W LAVAGE Left 03/05/2020    Procedure: Bronchoscopy, Rigid Or Flexible, Include Fluoroscopic Guidance When Performed; W/Bronchial Alveolar Lavage;  Surgeon: Jerelyn Charles, MD;  Location: MAIN OR [facility];  Service: Pulmonary   ??? PR BRONCHOSCOPY,TRANSBRON ASPIR BX Left 03/05/2020    Procedure: Bronchoscopy, Rigid/Flex, Incl Fluoro; W/Transbronch Ndl Aspirat Bx, Trachea, Main Stem &/Or Lobar Bronchus;  Surgeon: Jerelyn Charles, MD;  Location: MAIN OR [facility];  Service: Pulmonary   ??? PR BRONCHOSCOPY,TRANSBRONCH BIOPSY Left 03/05/2020    Procedure: Bronchoscopy, Rigid/Flexible, Include Fluoro Guidance When Performed; W/Transbronchial Lung Bx, Single Lobe;  Surgeon: Jerelyn Charles, MD;  Location:  MAIN OR [facility];  Service: Pulmonary   ??? PR CATH PLACE/CORON ANGIO, IMG SUPER/INTERP,W LEFT HEART VENTRICULOGRAPHY N/A 05/07/2020    Procedure: Left Heart Catheterization;  Surgeon: Lesle Reek, MD;  Location: [facility] CATH;  Service: Cardiology   ??? PR COLONOSCOPY W/BIOPSY SINGLE/MULTIPLE N/A 10/21/2016    Procedure: COLONOSCOPY, FLEXIBLE, PROXIMAL TO SPLENIC FLEXURE; WITH BIOPSY, SINGLE OR MULTIPLE;  Surgeon: Monte Fantasia, MD;  [facility]: GI PROCEDURES [facility];  Service: Gastroenterology   ??? PR EXCIS SUPRATENT BRAIN TUMOR Right 01/15/2019    Procedure: CRANIECTOMY; EXC BRAIN TUMOR-SUPRATENTORIAL;  Surgeon: Edison Simon, MD;  Location: MAIN OR [facility];  Service: Neurosurgery   ??? PR INCISE FINGER TENDON SHEATH Left 06/17/2019    Procedure: R-20 TENDON SHEATH INCISION (EG, FOR TRIGGER FINGER);  Surgeon: Daisy Lazar, MD;  Location: ASC OR [facility];  Service: Orthopedics   ??? PR MICROSURG TECHNIQUES,REQ OPER MICROSCOPE Right 01/15/2019    Procedure: MICROSURGICAL TECHNIQUES, REQUIRING USE OF OPERATING MICROSCOPE (LIST SEPARATELY IN ADDITION TO CODE FOR PRIMARY PROCEDURE);  Surgeon: Edison Simon, MD;  Location: MAIN OR [facility];  Service: Neurosurgery   ??? PR STEREOTACTIC COMP ASSIST PROC,CRANIAL,INTRADURAL Right 01/15/2019    Procedure: STEREOTACTIC COMPUTER-ASSISTED (NAVIGATIONAL) PROCEDURE; CRANIAL, INTRADURAL;  Surgeon: Edison Simon, MD;  Location: MAIN OR [facility]; Service: Neurosurgery   ??? PR UPPER GI ENDOSCOPY,BIOPSY N/A 10/21/2016    Procedure: UGI ENDOSCOPY; WITH BIOPSY, SINGLE OR MULTIPLE;  Surgeon: Monte Fantasia, MD;  Location: [facility] PROCEDURES [facility];  Service: Gastroenterology   ??? PR UPPER GI ENDOSCOPY,BIOPSY N/A 04/02/2018    Procedure: UGI ENDOSCOPY; WITH BIOPSY, SINGLE OR MULTIPLE;  Surgeon: Wendall Papa, MD;  [facility]: [facility] PROC[facility]URES [facility];  Service: Gastroenterology   ??? SKIN BIOPSY         No current facility-administered medications for this encounter.    Current Outpatient Medications:   ???  albuterol HFA 90 mcg/actuation inhaler, Inhale 2 puffs every six (6) hours as needed for wheezing., Disp: , Rfl:   ???  aluminum-magnesium hydroxide-simethicone 200-200-20 mg/5 mL Susp 80 mL, diphenhydrAMINE 12.5 mg/5 mL Liqd 200 mg, nystatin 100,000 unit/mL Susp 8,000,000 Units, distilled water Liqd 80 mL, lidocaine 2% viscous 2 % Soln 80 mL, Take 5 mL by mouth every six (6) hours as needed. Swish, gargle, spit or swallow if throat issues, Disp: 80 mL, Rfl: 2  ???  buprenorphine-naloxone (SUBOXONE) 2-0.5 mg sublingual film, Day 1-Take 1 film (2 mg) under tongue twice a day. Day 2-increase to 1 film (2 mg) every 8 hours (3x/day). Day 3 increase to 2 films (4 mg) twice a day for a 14 day supply., Disp: 53 Film, Rfl: 0  ???  capecitabine (XELODA) 500 MG tablet, Take 1 tablet (500 mg total) by mouth two (2) times a day, continuously., Disp: 60 tablet, Rfl: 5  ???  clobetasoL (TEMOVATE) 0.05 % ointment, Apply twice a day to rash on palm until smooth/clear., Disp: 30 g, Rfl: 1  ???  clonazePAM (KLONOPIN) 0.5 MG tablet, Take 0.5 mg daily as needed for anxiety.  Do not exceed 0.5 mg/day., Disp: 30 tablet, Rfl: 0  ???  diclofenac sodium (VOLTAREN) 1 % gel, Apply 2 g topically four (4) times a day as needed for arthritis or pain., Disp: 150 g, Rfl: 2  ???  diphenoxylate-atropine (LOMOTIL) 2.5-0.025 mg per tablet, Take 1 tablet by mouth 4 (four) times a day as needed for diarrhea., Disp: 30 tablet, Rfl: 1  ???  dorzolamide (TRUSOPT) 2 % ophthalmic solution,  Administer 1 drop into the left eye Three (3) times a day., Disp: 10 mL, Rfl: 12  ???  empagliflozin (JARDIANCE) 10 mg tablet, Take 1 tablet (10 mg total) by mouth in the morning., Disp: 30 tablet, Rfl: 11  ???  esomeprazole (NEXIUM) 40 MG capsule, Take 1 capsule (40 mg total) by mouth daily., Disp: 90 capsule, Rfl: 3  ???  lidocaine (LIDODERM) 5 % patch, Place 1 patch on the skin daily. Apply to affected area for 12 hours only each day (then remove patch), Disp: 30 patch, Rfl: 3  ???  lisinopriL-hydrochlorothiazide (PRINZIDE,ZESTORETIC) 20-25 mg per tablet, Take 1 tablet by mouth in the morning., Disp: 90 tablet, Rfl: 3  ???  MAGNESIUM ORAL, Take by mouth daily. OTC, Disp: , Rfl:   ???  naloxone (NARCAN) 4 mg nasal spray, One spray in either nostril once for known/suspected opioid overdose. May repeat every 2-3 minutes in alternating nostril til EMS arrives, Disp: 2 each, Rfl: 0  ???  neomycin-polymyxin-hydrocortisone (CORTISPORIN) otic solution, Administer 3 drops to the right ear Three (3) times a day for 10 days., Disp: 10 mL, Rfl: 0  ???  nicotine polacrilex (NICORETTE) 4 MG gum, Apply 1 each (4 mg total) to cheek every hour as needed for smoking cessation. (Patient not taking: Reported on 07/08/2021), Disp: 110 each, Rfl: 2  ???  ondansetron (ZOFRAN) 4 MG tablet, Take 1 tablet (4 mg total) by mouth every six (6) hours as needed for nausea., Disp: 30 tablet, Rfl: 1  ???  PAXLOVID CO-PACK, EUA, (NIRMATRELVIR-RITONAVIR) 300 mg (150 mg x 2)-100 mg tablet, Take by mouth Two (2) times a day for 5 days., Disp: 10 tablet, Rfl: 0  ???  potassium chloride (KLOR-CON) 10 MEQ CR tablet, Take 1 tablet (10 mEq total) by mouth Two (2) times a day., Disp: 180 tablet, Rfl: 0  ???  pregabalin (LYRICA) 200 MG capsule, Take 1 capsule (200 mg total) by mouth Two (2) times a day., Disp: 60 capsule, Rfl: 2  ???  prochlorperazine (COMPAZINE) 10 MG tablet, Take 1 tablet (10 mg total) by mouth every six (6) hours as needed for nausea., Disp: 30 tablet, Rfl: 6  ???  senna (SENOKOT) 8.6 mg tablet, Take 3 tablets by mouth in the morning., Disp: 30 tablet, Rfl: 2  ???  thiamine HCl (VITAMIN B-1 ORAL), Take by mouth daily. , Disp: , Rfl:   ???  traZODone (DESYREL) 50 MG tablet, Take 1.5 tablets (75 mg total) by mouth nightly., Disp: 45 tablet, Rfl: 1  ???  tucatinib (TUKYSA) 150 mg tablet, Take 1 tablet (150 mg total) by mouth two (2) times a day ., Disp: 60 tablet, Rfl: 1  ???  tucatinib (TUKYSA) 50 mg tablet, Take 2 tablets (100 mg total) by mouth two (2) times a day ., Disp: 120 tablet, Rfl: 3  ???  venlafaxine (EFFEXOR-XR) 150 MG 24 hr capsule, Take 2 capsules (300 mg total) by mouth in the morning., Disp: 60 capsule, Rfl: 2    Allergies  Adhesive, Decadron [dexamethasone], Tetracycline, Oxycodone, and Tegaderm adhesive-no drug-allergy check    Family History   Problem Relation Age of Onset   ??? Cancer Father    ??? Heart failure Father    ??? Heart attack Father    ??? Diabetes Sister    ??? Heart failure Sister    ??? Heart attack Sister    ??? Heart disease Sister    ??? Hypertension Sister    ??? Heart failure Brother    ???  Heart attack Brother    ??? Heart disease Brother    ??? Hypertension Brother    ??? Cancer Maternal Uncle    ??? Cancer Maternal Grandfather         lung cancer   ??? COPD Mother    ??? Hypertension Mother    ??? No Known Problems Maternal Aunt    ??? No Known Problems Paternal Aunt    ??? No Known Problems Paternal Uncle    ??? Heart disease Maternal Grandmother         mini strokes   ??? No Known Problems Paternal Grandmother    ??? No Known Problems Paternal Grandfather    ??? No Known Problems Other    ??? Cancer Maternal Uncle         Lung Cancer   ??? Cancer Paternal Aunt         Breast Cancer   ??? Diabetes Sister         Type 2   ??? Hypertension Sister    ??? Diabetes Sister         not sure what type   ??? Hypertension Sister    ??? Hypertension Sister    ??? Anesthesia problems Neg Hx    ??? Broken bones Neg Hx    ??? Clotting disorder Neg Hx    ??? Collagen disease Neg Hx    ??? Dislocations Neg Hx    ??? Fibromyalgia Neg Hx    ??? Gout Neg Hx    ??? Hemophilia Neg Hx    ??? Osteoporosis Neg Hx    ??? Rheumatologic disease Neg Hx    ??? Scoliosis Neg Hx    ??? Severe sprains Neg Hx    ??? Sickle cell anemia Neg Hx    ??? Spinal Compression Fracture Neg Hx        Social History  Social History     Tobacco Use   ??? Smoking status: Current Every Day Smoker     Packs/day: 1.00     Years: 40.00     Pack years: 40.00     Types: Cigarettes   ??? Smokeless tobacco: Never Used   ??? Tobacco comment: 10-20 single    Vaping Use   ??? Vaping Use: Never used   Substance Use Topics   ??? Alcohol use: Not Currently     Alcohol/week: 0.0 standard drinks     Comment: Have stopped about 6 months ago   ??? Drug use: No       Review of Systems  General: no fevers, chills  Cardiac: no CP, SOB, palpitations  Pulmonary: no cough, sputum production, hemoptysis  A 10 point review of systems was negative except for those previously mentioned in the HPI and above.    Physical Exam     ED Triage Vitals   Enc Vitals Group      BP 07/17/21 1751 133/93      Heart Rate 07/17/21 1751 90      SpO2 Pulse 07/17/21 2139 74      Resp 07/17/21 1751 17      Temp 07/17/21 1751 36.4 ??C (97.5 ??F)      Temp Source 07/17/21 1751 Oral      SpO2 07/17/21 1751 97 %      Weight --       Height --       Head Circumference --       Peak Flow --       Pain Score --  Pain Loc --       Pain Edu? --       Excl. in GC? --        Constitutional: alert and cooperative in NAD  ENT:       Head: [place]/AT       Eyes: PERRL, conjunctiva clear, sclera anicteric       Nose: no congestion       Mouth/Throat: MM moist       Neck: supple, midline trachea, no tenderness or cervical adenopathy  Cardiac: RRR, normal S1, S2, no appreciable murmurs or rubs. Distal pulses present and symmetrical B/L  Respiratory: CTA bilaterally, non-labored breathing, no stridor, wheezing or crackles  Abdomen: soft, NT, normal BS. No masses, rebound or guarding  Extremities: LE normal with no CCE, symmetric in size and NTTP  Skin: warm and dry, no rashes or lesions  Neurologic: no gross focal neurologic deficits appreciated  Psychiatric: normal mood, affect, speech and behavior      Radiology         I independently visualized these images.     Harriet Pho, MD  07/19/21 [phone]

## 2021-07-19 NOTE — Unmapped (Signed)
Addended by: Janell Quiet on: 07/19/2021 08:08 AM     Modules accepted: Orders

## 2021-07-20 NOTE — Unmapped (Signed)
Hi,     Brooke [name] has contacted the Communication Center in regards to the following symptom:     Pain: new onset - Patient states she believes related to COVID pills she started taking on 07/19/21.    Please contact Sarahann Horrell at [phone]      A page or telephone call has been made to the corresponding clinic.     Thank you,  Yolanda Bonine   [facility]   [phone]

## 2021-07-20 NOTE — Unmapped (Signed)
[facility] Triage Note     Patient: Brooke [name]     Reason for call:  return call    Time call returned: 1408     Phone Assessment: pt calling to report that the she started the paxlovid yesterday (three tablets) and has taken today's dosage but she says that her stomach has been killing her.  She is not nauseated or vomiting and has not really had diarrhea but the pain is horrible.  She states that the pain in right underneath her belly button in the middle of her stomach.      She is reporting no fever but endorses congestion and fever.  She has flonase and some over the counter allergy/cold medicine that she is taking for the s/s.    Goal for this communication:  Wanted to let the team know that she does not feel she can tolerate the paxlovid     Triage Recommendations: RN will relay message to team and someone will call her back with further instructions

## 2021-07-20 NOTE — Unmapped (Signed)
Patient called and left a message asking someone to return her call. This nurse returned the call and left a message on her home number. Attempted monile number no answer no voicemail

## 2021-07-20 NOTE — Unmapped (Signed)
Triage RN returned call to pt to advise she contact her PCP and discuss the side effects she feels is coming from the paxlovid and to discuss possible alternatives.  Pt states understanding and will contact them.

## 2021-07-20 NOTE — Unmapped (Signed)
[name] 's Brooke [name] shipment will be sent out  as a result of the medication is no longer refill too soon.      I have reached out to the patient  at [phone] and communicated the delivery change. We will reschedule the medication for the delivery date that the patient agreed upon.  We have confirmed the delivery date as 07/21/21, via same day courier.

## 2021-07-22 MED ORDER — BENZONATATE 200 MG CAPSULE
ORAL_CAPSULE | Freq: Three times a day (TID) | ORAL | 0 refills | 7 days | Status: CP | PRN
Start: 2021-07-22 — End: 2021-07-29

## 2021-07-22 NOTE — Unmapped (Signed)
Patient is requesting something to help with her cough related to Covid.  Patient is using otc medications but states they aren't helping with her cough. Is requesting a prescription of Tessalon

## 2021-07-23 NOTE — Unmapped (Signed)
Called and left msg with pts sister [name] that her NM bone scan, ct and Dr [name] visit have been rescheduled to 8/16 due to her having covid.  Left callback # so we could discuss next infusion.

## 2021-07-30 NOTE — Unmapped (Signed)
Pharmacist Medication Follow-Up  I called the patient to f/u on capecitabine.  I left a VM with my contact information and sent a My Chart message.Marland Kitchen

## 2021-08-04 ENCOUNTER — Institutional Professional Consult (permissible substitution): Admit: 2021-08-04 | Payer: MEDICARE

## 2021-08-04 MED ORDER — BENZONATATE 200 MG CAPSULE
ORAL_CAPSULE | Freq: Three times a day (TID) | ORAL | 1 refills | 10 days | Status: CP | PRN
Start: 2021-08-04 — End: ?

## 2021-08-04 NOTE — Unmapped (Signed)
[facility] Refill Coordination Note    Specialty Medication(s) to be Shipped:   Hematology/Oncology: Brooke [name] 50mg  and Capecitabine 500mg , directions: Take 1 tablet (500 mg total) by mouth two (2) times a day, continuously.    Other medication(s) to be shipped: No additional medications requested for fill at this time     Brooke [name], DOB: [birth year]  Phone: [phone] (home)       All above HIPAA information was verified with patient's family member, Brooke [name].     Was a Nurse, learning disability used for this call? No    Completed refill call assessment today to schedule patient's medication shipment from the [facility] [phone]).  All relevant notes have been reviewed.     Specialty medication(s) and dose(s) confirmed: Regimen is correct and unchanged.   Changes to medications: Averiana reports no changes at this time.  Changes to insurance: No  New side effects reported not previously addressed with a pharmacist or physician: None reported  Questions for the pharmacist: No    Confirmed patient received a Conservation officer, historic buildings and a Surveyor, mining with first shipment. The patient will receive a drug information handout for each medication shipped and additional FDA Medication Guides as required.       DISEASE/MEDICATION-SPECIFIC INFORMATION        N/A    SPECIALTY MEDICATION ADHERENCE     Medication Adherence    Patient reported X missed doses in the last month: 0  Specialty Medication: Capecitabine 500mg   Patient is on additional specialty medications: Yes  Additional Specialty Medications: Brooke.[name] 50mg   Patient Reported Additional Medication X Missed Doses in the Last Month: 0  Patient is on more than two specialty medications: No  Informant: caregiver        Capecitabine was placed on hold due to patient tested positive for Covid      Were doses missed due to medication being on hold? No    Capecitabine 500 mg: 10 days of medicine on hand   Tukysa 50 mg: 10 days of medicine on hand REFERRAL TO PHARMACIST     Referral to the pharmacist: Not needed      [facility]     Shipping address confirmed in Epic.     Delivery Scheduled: Yes, Expected medication delivery date: 08/09/21.     Medication will be delivered via Same Day Courier to the prescription address in Epic WAM.    [name]   [facility] Specialty Technician

## 2021-08-05 MED ORDER — BUPRENORPHINE 2 MG-NALOXONE 0.5 MG SUBLINGUAL FILM
ORAL_FILM | 0 refills | 0 days | Status: CP
Start: 2021-08-05 — End: ?

## 2021-08-05 NOTE — Unmapped (Signed)
OUTPATIENT ONCOLOGY PALLIATIVE CARE    Principal Diagnosis: Brooke [name] is a 65 [name].o. female with metastatic breast cancer,  diagnosed in 2017.  Disease sites include lung and brain.     Assessment/Plan:   1.  Painful neuropathic hands and feet-worsening and continued mid upper back pain and more recently headache with diagnosis of COVID.  MRI brain 07/14/2021 stable.  Hx of cervical thoracic paraspinal trigger point bilateral this past July 2022 with not as much relief as she has had in the past.    -Continue lyrica 200 mg bid dosing.  -Restart Suboxone at 1/4 film (has 2 mg films) (0.5mg ) daily x 1 day and if tolerating increase to twice daily dosing.  The Suboxone 2 mg dosing was too sedating and produced dizziness. Will cut films into quarters.   -Shared that I feel that the Suboxone can help with the neuropathic pain as well.  -Can consider seeing neurology if headache persist.  -Continue Effexor 225 mg every day  -Continue diclofenac gel as needed  -Not able to tolerate NSAIDs (due to platelets) and tylenol (recent elevated liver enzymes)      Opioid use in past:  -Had difficulty tolerating the methadone 2.5 mg dosing due to sedation.  Did not try the 1 mg methadone, so could consider this in the future if needed for the neuropathic discomfort.  Some reluctance in re-starting methadone.  -No relief with tramadol  -oxycodone-night terrors.    2.  Cough due to COVID-diagnosed with COVID at the end of July and still with symptoms of cough, fatigue, diarrhea and headache.  Her sense of smell and taste is slowly improving.  Did also have some right ear hearing loss.  -Reorder [employer].  -Finding relief with Imodium and slept well last night.  -Discussed rest and hydration, balanced with movement.    3.  Support-  -active with Sherrie Mustache social worker has been helpful in trying to get more home support.  -Trying to get a new phone so that she can have video visits with her healthcare team.      4. Goals of care    At prior visits: goals which are to decrease pain and to maintain her independence. Lives alone and Brooke [name] lives close by.     Wants to ensure that her quality of life remains high and the wish to keep on going.  Shared concern that she does not want to be in pain.  NP provided reassurance that our team can offer her different treatment strategies to help her with her goal.     Advance care planning-see advance care planning note dated 01/31/2020.  -at prior visits: Patient shared that she has been dreaming about death. She does have her funeral arrangements completed. Shared that she has her wishes written down and her Sister Brooke [name] knows where they are regarding her funeral. She still contemplating whether to be cremated or buried and she is leaning toward cremation. patient currently focused on cancer directed therapy.  Prefers to stay in the moment and not discussed things.  We will continue to support and address if she has a change in clinical condition.        -Advance directives scanned in on 11/02/19        HCDM Brooke [name]): [place] - Sister - [phone]    HCDM, [name] - Sister - [phone]    At prior visits,   # Controlled substances risk management.  We are not currently prescribing controlled  medications for her.   - Patient has a signed pain medication agreement with Outpt Palliative care, completed on 11/01/18, as per standard care. This was signed again on 01/28/19   - [place] database was reviewed today and it was appropriate.   - Urine drug screen was not performed at this visit. Findings: not applicable.   - Patient has received information about safe storage and administration of medications.   - Patient has received a prescription for narcan and shared with Brooke [name] and pt.       F/u: 1 month phone visit    ----------------------------------------  Referring Provider: From inpatient oncology team  Oncology Team: Breast team-Dr. [name]  PCP: [name], MD      HPI: 65 year old woman with her to overexpressing metastatic breast cancer to her lung and brain.  Was found to have multiple small brain metastases and completed CyberKnife therapy on September 11. Describes ongoing pain in her pelvis, rates it as a 3 out of 10 today.  Has been improving over the last week or 2.  Feels like gabapentin has been helpful, and is also responded well to Tylenol and ibuprofen in the past.  She is taken oxycodone and it gave her night terrors does not want to take it again.  Has taken Dilaudid previously and did not have side effects from this.    Current cancer-directed therapy: capecitabine (Xeloda), tucatinib, and trastuzumab (Herceptin).       Interval hx 06/30/21 CK and Annali    -More engaged with boyfriend-[name] and spending more time with him. Went on a motorcycle ride this past w/e.  -Has enjoyed spending more time with Brooke [name] (he isn't spending as much time playing golf). He has been helping with cooking and her medications.   -Sleeping better. It has been a good 2 wks.  -Planning to go to beach at the end of Aug.   -NP inquired as if [name] drinks alcohol and Brooke [name] confirmed that he does but it is socially.  Shares that she does drink with him, but limits it to 1 glass and does not use the alcohol with the opioids.  -Acknowledged that its been good to have [name] be with her because outside of Brooke [name] her sister really nobody else is visiting.    -No diarrhea until this am. Didn't have diarrhea x 2 wks. Feels like she isn't going to have any more. Xeloda last dose was   -Takes antinausea med prior [place] and this helps.     -Cleaned out her medicine cabinet-it is more organized now.   -Not using the pill box-hard to open and reading pill bottles. Reading the bottles prior to taking.     -Pain spike yesterday-upper back-mid back towards right side and attributes this to moving more yesterday.    -Neuropathy has changed-worsening.  Has intense toes and feet sensations.  Feels like there is pain shooting out from her toes and at the top of her feet are swollen, but they are not.  Hands/fingertips losing some sensation, but better today.   -Hasn't used butrans patch since last Friday.  Did not feel that the Butrans patch even at the 15 mcg per dosing was effective.  Has not taken any opioids since the Butrans patch last Friday.  -Rare headache and right ear pain    Interval history 08/04/2021 CK and Brooke [name]    -Slowly recovering from COVID.  Reports that she still spends most of the time in bed.  -Continues to have a cough  and a heavy sensation feeling in her chest and at times feels hot.  Overall she is slowly getting better.  -Feels that she could eat real food now that her sense of smell is slowly returning.  Was able to eat eggs and bacon for breakfast this morning.  -Continues to have a headache. Right ear hearing has improved.  -Spine injections really did not help the upper back pain like they did in the past.  -The Suboxone 2 mg dosing was too sedating and produce dizziness as well.  -Shares that there is so much going on with the rest of her body has not been really paying attention to the neuropathic pain that she has in her feet.  -Slept well last night and attributes this to the Imodium working.  -Was able to go to the grocery store today.      Palliative Performance Scale: 70% - Ambulation: Reduced / unable to do normal work, some evidence of disease / Self-Care: Full / Intake: Normal or reduced / Level of Conscious: Full         Coping/Support Issues: Patient reports she is been able to attend church functions and is finding this very helpful. Has boyfriend Brooke [name] for the last 15 years.     At prior visits, patient did share that she continues to receive much help from her church friend, [name] and her husband.  They prayed together and she is found this supportive.  She states that her 2 sisters have been supportive, but she feels that they are becoming more less patient with her due to patient's irritability.     Overall coping well, no specific issues identified.  Finding support with her church community, her sister, Brooke [name], and prayer.         Social History: Pt lives in senior housing in [place] month, says it's ok and quiet but she is from [place] and liked it there more. She has 4 sisters, 2 nearby and involved in care. She has 1 son-[name], 35yo, who lives about an hour and a half away in [place]. She is divorced.  ??  Goes to [employer] in [place] and gets a lot of support. Her church friend [name] goes with her to chemo appts. Her sisters will go too.    Advance Care Planning: Advance directives scanned into system  HCPOA: See ACP note  Living Will: See ACP note  ACP note: Yes, advance directive scanned in on November 6    Objective     Opioid Risk Tool:      Opioid Risk Tool:   Female  Female    Family history of substance abuse      Alcohol   1  3    Illegal drugs  2  3    Rx drugs  4  4    Personal history of substance abuse      Alcohol  3  3    Illegal drugs  4  4    Rx drugs  5  5    Age between 16--45 years  1  1    History of preadolescent sexual abuse  3 0    Psychological disease      ADD, OCD, bipolar, schizophrenia  2  2    Depression  1  1    Total: 7  (<3 low risk, 4-7 moderate risk, >8 high risk)      Oncology History Overview Note   Identifying Statement:  Brooke [name]  is a 65 [name].o. female diagnosed with a right posterior frontal lobe metastasis (breast primary) status post resection 01/15/2019 and 5/5 fractions of SBRT (2500 Gy via CyberKnife) to the resection cavity.    Treatment History:  09/05/17: S/p SRS to 7 lesions  01/15/19: S/p resection, followed by SRS 2500 cGy   03/04/19: KPS 80; MRI with postsurgical changes versus residual disease; RTC in 2 weeks w/ MRI   04/15/19: KPS NA; MRI w/ SD; no study; RTC 6 weeks w/ MRI   06/10/19: KPS 80; MRI w/ SD; RTC 4 weeks  07/15/19: KPS 80; MRI w/ SD; RTC 8 weeks  08/19/19: KPS 80; start nortriptyline for HAs; RTC 4 weeks w/ MRI  09/16/19: KPS 80; con't nortriptyline for HAs; RTC 2 mos w/ MRI  11/11/19: KPS 80; MRI w/ SD though w/ small infarct; proceed w/ CVA workup; start 81mg  ASA; encouraged smoking cessation; con't nortriptyline; RTC to discuss results  11/25/19: KPS 80; discussed CVA workup results; MRA unremarkable; con't smoking cessation efforts; con't nortriptyline for HAs; con't 81mg  ASA, start Lipitor; f/u with PCP for COPD eval; RTC 2 mos w/ MRI     Malignant neoplasm of overlapping sites of right female breast (CMS-HCC)   2017 -  Presenting Symptoms    Large open wound RT breast which began as nipple inversion. Patient states that she ignored for a long time.  Physical exam of the area of concern in the RTbreast demonstrates a large open wound in the lateral right breast. Saw PCP 01/27/16 Rx Keflex.     02/04/2016 Interval Scan(s)    MMG/US: 3.1 (3.0) cm irregular spiculated dense mass in lateral Rt breast w nipple and skin retraction. 2 subcentimeter irregular masses in the RUOQ  (10', 11') (possible satellite lesions). Large Rt axillary lymph node 1.7. Lt breast clear. BIRAD 5.     02/08/2016 Biopsy    US guided Rt. Axillary LN biopsy:  Gr 3, IDC with apocrine features. ER (-), PR (31-40), HER-2 (3+). Noted to have multiple skin lesions. Poor access to breast mass due open 10-15 cm wound risk of pain and bleeding.     02/09/2016 Initial Diagnosis    Malignant neoplasm of overlapping sites of right female breast (RAF-HCC)     02/10/2016 Interval Scan(s)    CT CAP: Large, necrotic right breast mass with wide open tract to the skin, consistent with known right breast malignancy.  Numerous enlarged right axillary, subpectoral, mediastinal, bilateral hilar lymph nodes and innumerable bilateral pulmonary nodules     02/10/2016 Interval Scan(s)    NM Bone scan: No osseous metastatic disease.     04/28/2016 - 03/10/2020 Chemotherapy    OP TRASTUZUMAB (EVERY 21 DAYS)  Trastuzumab 8 mg/kg loading then 6 mg/kg every 21 days 07/13/2016 Genetics    STRATA  ??? ERBB2 copy number alteration  Estimated copy number: 40, confidence interval: 35.7 - 44.0, cellularity: 50%  Associated FDA-approved targeted therapies in breast cancer: trastuzumab, lapatinib, ado-trastuzumab emtansine, pertuzumab  ??? PIK3CA [id number].E545A     01/03/2020 -  Cancer Staged    CT CAP 01/03/2020 which showed a new lingular opacity measuring 1.9 cm in the right lung. There was also slight increase in the right breast mass from 1.2-> 1.5 cm.  Bone scan shows no osseous metastases. Overall I considered these findings indeterminate and ppted to repeat CT CAP in 1 month     02/03/2020 -  Cancer Staged    CT chest 02/03/20 which showed that the lingular lesion has  persisted and increased in size.  No mediastinal adenopathy reported.  She continues to report a nonproductive cough and mild dyspnea.  She is a chronic smoker and currently smokes on average half a pack per day.  My major concern is that this could be a second primary lung cancer as her other sites of diseases stable on current systemic therapy       02/03/2020 Endocrine/Hormone Therapy    Stop Tamoxifen, Add Fasoldex, continue Q3week Hercpetin     03/23/2020 - 02/09/2021 Chemotherapy    OP BREAST ADO-TRASTUZUMAB EMTANSINE  ado-trastuzumab emtansine 3.6 mg/kg IV on day 1, every 21 days     04/12/2021 -  Chemotherapy    Tucatinib + Capecitabine  OP TRASTUZUMAB (EVERY 21 DAYS)  trastuzumab 8 mg/kg IV LOADING, then 6 mg/kg IV MAINT, every 21 days     Brain metastases (CMS-HCC)   08/07/2017 Initial Diagnosis    Brain metastases (CMS-HCC)    Summary of Radiosurgery   Rx:7 brain met: 09/05/2017: 2,000/2,000 cGy  Sec:R Frontal: 09/05/2017: 2,000 cGy  Sec:L Post Fr: 09/05/2017: 2,000 cGy  Sec:L Ant Fro: 09/05/2017: 2,000 cGy  Sec:R Sup Oc: 09/05/2017: 2,000 cGy  Sec:L [place]: 09/05/2017: 2,000 cGy  Sec:R Inf Occi: 09/05/2017: 2,000 cGy  Sec:L Tempor: 09/05/2017: 2,000 cGy  Rx:Lt Med Oc: 09/13/2018: 2,000/2,000 cGy  Rx:Rt Frontal: : 2,500/2,500 cGy    01/15/2019: Craniotomy and resection of right posterior frontal lobe metastasis. Followed by CK    03/04/19: KPS 80; MRI with postsurgical changes versus residual disease; RTC in 2 weeks w/ MRI     01/03/2020 -  Cancer Staged    CT CAP 01/03/2020 which showed a new lingular opacity measuring 1.9 cm in the right lung. There was also slight increase in the right breast mass from 1.2-> 1.5 cm.  Bone scan shows no osseous metastases. Overall I considered these findings indeterminate and ppted to repeat CT CAP in 1 month     02/03/2020 -  Cancer Staged    CT chest 02/03/20 which showed that the lingular lesion has persisted and increased in size.  No mediastinal adenopathy reported.  She continues to report a nonproductive cough and mild dyspnea.  She is a chronic smoker and currently smokes on average half a pack per day.  My major concern is that this could be a second primary lung cancer as her other sites of diseases stable on current systemic therapy       02/03/2020 Endocrine/Hormone Therapy    Stop Tamoxifen, Add Fasoldex, continue Q3week Hercpetin     Metastatic breast cancer (CMS-HCC)   01/25/2019 Initial Diagnosis    Metastatic breast cancer (CMS-HCC)     03/23/2020 - 02/09/2021 Chemotherapy    OP BREAST ADO-TRASTUZUMAB EMTANSINE  ado-trastuzumab emtansine 3.6 mg/kg IV on day 1, every 21 days     04/12/2021 -  Chemotherapy    Tucatinib + Capecitabine  OP TRASTUZUMAB (EVERY 21 DAYS)  trastuzumab 8 mg/kg IV LOADING, then 6 mg/kg IV MAINT, every 21 days         Patient Active Problem List   Diagnosis   ??? Malignant neoplasm of overlapping sites of right female breast (CMS-HCC)   ??? Peripheral neuropathy   ??? Insomnia   ??? Brain metastases (CMS-HCC)   ??? Nausea   ??? Closed fracture of one rib with routine healing   ??? Diarrhea of presumed infectious origin   ??? Failure to thrive in adult   ??? Tobacco use   ??? Chronic diarrhea   ???  Depression   ??? Hyponatremia   ??? Back pain   ??? Closed fracture of multiple pubic rami, right, initial encounter (CMS-HCC)   ??? Macrocytosis   ??? Pelvic fracture (CMS-HCC)   ??? Metastatic breast cancer (CMS-HCC)   ??? Migraine without aura and without status migrainosus, not intractable   ??? Essential hypertension   ??? CVA (cerebral vascular accident) (CMS-HCC)   ??? DDD (degenerative disc disease), cervical   ??? Wheezing   ??? Dyslipidemia   ??? Angina pectoris (CMS-HCC)   ??? Papillary fibroelastoma of heart   ??? Antineoplastic chemotherapy induced anemia   ??? Celiac disease   ??? Iron deficiency anemia due to chronic blood loss   ??? Dry eye syndrome, bilateral   ??? Meibomian gland dysfunction (MGD) of both eyes   ??? Glaucoma suspect of both eyes   ??? Incipient cataract of both eyes   ??? 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 Fatty liver       Past Medical History:   Diagnosis Date   ??? Abnormal ECG unsure   ??? Alcoholism (CMS-HCC)    ??? Allergic rhinitis    ??? Anemia    ??? Anxiety     insomnia   ??? Arthritis not sure   ??? Brain concussion    ??? Breast cancer (CMS-HCC)     chemo for now with mets   ??? COPD (chronic obstructive pulmonary disease) (CMS-HCC)    ??? Depression    ??? Dysphagia    ??? Fatty liver 07/08/2021   ??? Genitourinary disease    ??? GERD (gastroesophageal reflux disease)    ??? Headache    ??? HTN (hypertension)    ??? Hyperlipidemia    ??? Insomnia    ??? 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 Stroke (CMS-HCC) Nov. 2020    Dr. Theodoro Kalata   ??? Tobacco dependence        Past Surgical History:   Procedure Laterality Date   ??? CESAREAN SECTION     ??? CHOLECYSTECTOMY     ??? FINGER SURGERY Right     trigger finger   ??? GANGLION CYST EXCISION Left     hand   ??? HYSTERECTOMY     ??? LYMPH NODE BIOPSY Right 02/08/2016    Axillary   ??? PR BRNSCHSC TNDSC EBUS DX/TX INTERVENTION PERPH LES Left 03/05/2020    Procedure: Bronch, Rigid Or Flexible, Including Fluoro Guidance, When Performed; With Transendoscopic Ebus During Bronchoscopic Diagnostic Or Therapeutic Intervention(S) For Peripheral Lesion(S);  Surgeon: Jerelyn Charles, MD; Location: MAIN OR [name];  Service: Pulmonary   ??? PR BRONCHOSCOPY,COMPUTER ASSIST/IMAGE-GUIDED NAVIGATION Left 03/05/2020    Procedure: BRONCHOSCOPY, RIGID OR FLEXIBLE, INCLUDE FLUORO WHEN PERFORMED; W/COMPUTER-ASSIST, IMAGE-GUIDED NAVIGATION;  Surgeon: Jerelyn Charles, MD;  Location: MAIN OR [facility];  Service: Pulmonary   ??? PR BRONCHOSCOPY,DIAGNOSTIC W LAVAGE Left 03/05/2020    Procedure: Bronchoscopy, Rigid Or Flexible, Include Fluoroscopic Guidance When Performed; W/Bronchial Alveolar Lavage;  Surgeon: Jerelyn Charles, MD;  Location: MAIN OR [facility];  Service: Pulmonary   ??? PR BRONCHOSCOPY,TRANSBRON ASPIR BX Left 03/05/2020    Procedure: Bronchoscopy, Rigid/Flex, Incl Fluoro; W/Transbronch Ndl Aspirat Bx, Trachea, Main Stem &/Or Lobar Bronchus;  Surgeon: Jerelyn Charles, MD;  Location: MAIN OR [facility];  Service: Pulmonary   ??? PR BRONCHOSCOPY,TRANSBRONCH BIOPSY Left 03/05/2020    Procedure: Bronchoscopy, Rigid/Flexible, Include Fluoro Guidance When Performed; W/Transbronchial Lung Bx, Single Lobe;  Surgeon: Jerelyn Charles, MD;  Location: MAIN  OR [name];  Service: Pulmonary   ??? PR CATH PLACE/CORON ANGIO, IMG SUPER/INTERP,W LEFT HEART VENTRICULOGRAPHY N/A 05/07/2020    Procedure: Left Heart Catheterization;  Surgeon: Lesle Reek, MD;  Location: [facility] CATH;  Service: Cardiology   ??? PR COLONOSCOPY W/BIOPSY SINGLE/MULTIPLE N/A 10/21/2016    Procedure: COLONOSCOPY, FLEXIBLE, PROXIMAL TO SPLENIC FLEXURE; WITH BIOPSY, SINGLE OR MULTIPLE;  Surgeon: Monte Fantasia, MD;  Location: GI PROCEDURES [facility];  Service: Gastroenterology   ??? PR EXCIS SUPRATENT BRAIN TUMOR Right 01/15/2019    Procedure: CRANIECTOMY; EXC BRAIN TUMOR-SUPRATENTORIAL;  Surgeon: Edison Simon, MD;  Location: MAIN OR [facility];  Service: Neurosurgery   ??? PR INCISE FINGER TENDON SHEATH Left 06/17/2019    Procedure: R-20 TENDON SHEATH INCISION (EG, FOR TRIGGER FINGER);  Surgeon: Daisy Lazar, MD;  Location: ASC OR [facility];  Service: Orthopedics   ??? PR MICROSURG TECHNIQUES,REQ OPER MICROSCOPE Right 01/15/2019    Procedure: MICROSURGICAL TECHNIQUES, REQUIRING USE OF OPERATING MICROSCOPE (LIST SEPARATELY IN ADDITION TO CODE FOR PRIMARY PROCEDURE);  Surgeon: Edison Simon, MD;  Location: MAIN OR [place] [name];  Service: Neurosurgery   ??? PR STEREOTACTIC COMP ASSIST PROC,CRANIAL,INTRADURAL Right 01/15/2019    Procedure: STEREOTACTIC COMPUTER-ASSISTED (NAVIGATIONAL) PROCEDURE; CRANIAL, INTRADURAL;  Surgeon: Edison Simon, MD;  Location: MAIN OR [name];  Service: Neurosurgery   ??? PR UPPER GI ENDOSCOPY,BIOPSY N/A 10/21/2016    Procedure: UGI ENDOSCOPY; WITH BIOPSY, SINGLE OR MULTIPLE;  Surgeon: Monte Fantasia, MD;  Location: GI PROCEDURES [facility];  Service: Gastroenterology   ??? PR UPPER GI ENDOSCOPY,BIOPSY N/A 04/02/2018    Procedure: UGI ENDOSCOPY; WITH BIOPSY, SINGLE OR MULTIPLE;  Surgeon: Wendall Papa, MD;  [facility]  Service: Gastroenterology   ??? SKIN BIOPSY         Current Outpatient Medications   Medication Sig Dispense Refill   ??? albuterol HFA 90 mcg/actuation inhaler Inhale 2 puffs every six (6) hours as needed for wheezing.     ??? aluminum-magnesium hydroxide-simethicone 200-200-20 mg/5 mL Susp 80 mL, diphenhydrAMINE 12.5 mg/5 mL Liqd 200 mg, nystatin 100,000 unit/mL Susp 8,000,000 Units, distilled water Liqd 80 mL, lidocaine 2% viscous 2 % Soln 80 mL Take 5 mL by mouth every six (6) hours as needed. Swish, gargle, spit or swallow if throat issues 80 mL 2   ??? benzonatate (TESSALON) 200 MG capsule Take 1 capsule (200 mg total) by mouth Three (3) times a day as needed for cough. 30 capsule 1   ??? buprenorphine-naloxone (SUBOXONE) 2-0.5 mg sublingual film Day 1-Take 1 film (2 mg) under tongue twice a day. Day 2-increase to 1 film (2 mg) every 8 hours (3x/day). Day 3 increase to 2 films (4 mg) twice a day for a 14 day supply. 53 Film 0   ??? capecitabine (XELODA) 500 MG tablet Take 1 tablet (500 mg total) by mouth two (2) times a day, continuously. 60 tablet 5   ??? clobetasoL (TEMOVATE) 0.05 % ointment Apply twice a day to rash on palm until smooth/clear. 30 g 1   ??? clonazePAM (KLONOPIN) 0.5 MG tablet Take 0.5 mg daily as needed for anxiety.  Do not exceed 0.5 mg/day. 30 tablet 0   ??? diclofenac sodium (VOLTAREN) 1 % gel Apply 2 g topically four (4) times a day as needed for arthritis or pain. 150 g 2   ??? diphenoxylate-atropine (LOMOTIL) 2.5-0.025 mg per tablet Take 1 tablet by mouth 4 (four) times a day as needed for diarrhea. 30 tablet 1   ???  dorzolamide (TRUSOPT) 2 % ophthalmic solution Administer 1 drop into the left eye Three (3) times a day. 10 mL 12   ??? empagliflozin (JARDIANCE) 10 mg tablet Take 1 tablet (10 mg total) by mouth in the morning. 30 tablet 11   ??? esomeprazole (NEXIUM) 40 MG capsule Take 1 capsule (40 mg total) by mouth daily. 90 capsule 3   ??? lidocaine (LIDODERM) 5 % patch Place 1 patch on the skin daily. Apply to affected area for 12 hours only each day (then remove patch) 30 patch 3   ??? lisinopriL-hydrochlorothiazide (PRINZIDE,ZESTORETIC) 20-25 mg per tablet Take 1 tablet by mouth in the morning. 90 tablet 3   ??? MAGNESIUM ORAL Take by mouth daily. OTC     ??? naloxone (NARCAN) 4 mg nasal spray One spray in either nostril once for known/suspected opioid overdose. May repeat every 2-3 minutes in alternating nostril til EMS arrives 2 each 0   ??? ondansetron (ZOFRAN) 4 MG tablet Take 1 tablet (4 mg total) by mouth every six (6) hours as needed for nausea. 30 tablet 1   ??? PAXLOVID CO-PACK, EUA, (PAXLOVID CO-PACK, EUA,) 300 mg (150 mg x 2)-100 mg tablet See package instructions. 30 tablet 0   ??? potassium chloride (KLOR-CON) 10 MEQ CR tablet Take 1 tablet (10 mEq total) by mouth Two (2) times a day. 180 tablet 0   ??? pregabalin (LYRICA) 200 MG capsule Take 1 capsule (200 mg total) by mouth Two (2) times a day. 60 capsule 2   ??? prochlorperazine (COMPAZINE) 10 MG tablet Take 1 tablet (10 mg total) by mouth every six (6) hours as needed for nausea. 30 tablet 6   ??? senna (SENOKOT) 8.6 mg tablet Take 3 tablets by mouth in the morning. 30 tablet 2   ??? thiamine HCl (VITAMIN B-1 ORAL) Take by mouth daily.      ??? traZODone (DESYREL) 50 MG tablet Take 1.5 tablets (75 mg total) by mouth nightly. 45 tablet 1   ??? tucatinib (TUKYSA) 150 mg tablet Take 1 tablet (150 mg total) by mouth two (2) times a day . 60 tablet 1   ??? tucatinib (TUKYSA) 50 mg tablet Take 2 tablets (100 mg total) by mouth two (2) times a day . 120 tablet 3   ??? venlafaxine (EFFEXOR-XR) 150 MG 24 hr capsule Take 2 capsules (300 mg total) by mouth in the morning. 60 capsule 2     No current facility-administered medications for this visit.       Allergies:   Allergies   Allergen Reactions   ??? Adhesive Rash   ??? Decadron [Dexamethasone] Anxiety     Mania as well   ??? Tetracycline      Other reaction(s): Other (See Comments)   ??? Oxycodone      Night terrors   ??? Tegaderm Adhesive-No Drug-Allergy Check Rash       Family History:  Cancer-related family history includes Cancer in her father, maternal grandfather, maternal uncle, maternal uncle, and paternal aunt.  She indicated that the status of her mother is unknown. She indicated that the status of her father is unknown. She indicated that the status of her brother is unknown. She indicated that the status of her maternal grandmother is unknown. She indicated that the status of her maternal grandfather is unknown. She indicated that the status of her paternal grandmother is unknown. She indicated that the status of her paternal grandfather is unknown. She indicated that the status of her maternal aunt  is unknown. She indicated that the status of her paternal uncle is unknown. She indicated that the status of her neg hx is unknown. She indicated that the status of her other is unknown.           Lab Results   Component Value Date    CREATININE 0.61 07/06/2021     Lab Results   Component Value Date ALKPHOS 203 (H) 07/06/2021    BILITOT 0.6 07/06/2021    BILIDIR 0.30 03/29/2021    PROT 7.3 07/06/2021    ALBUMIN 3.6 07/06/2021    ALT 20 07/06/2021    AST 39 (H) 07/06/2021             Burtis Junes, FNP-BC, [facility]  Outpatient Oncology Palliative Care Service  [place] [name]  [address] [place], [place] [address]  [phone]             The patient reports they are currently: at home. I spent 28 minutes on the phone with the patient on the date of service. I spent an additional 5 minutes on pre- and post-visit activities on the date of service.     The patient was physically located in [place] or a state in which I am permitted to provide care. The patient and/or parent/guardian understood that s/he may incur co-pays and cost sharing, and agreed to the telemedicine visit. The visit was reasonable and appropriate under the circumstances given the patient's presentation at the time.    The patient and/or parent/guardian has been advised of the potential risks and limitations of this mode of treatment (including, but not limited to, the absence of in-person examination) and has agreed to be treated using telemedicine. The patient's/patient's family's questions regarding telemedicine have been answered.     If the visit was completed in an ambulatory setting, the patient and/or parent/guardian has also been advised to contact their provider???s office for worsening conditions, and seek emergency medical treatment and/or call 911 if the patient deems either necessary.

## 2021-08-09 MED FILL — CAPECITABINE 500 MG TABLET: ORAL | 30 days supply | Qty: 60 | Fill #1

## 2021-08-09 MED FILL — TUKYSA 50 MG TABLET: ORAL | 30 days supply | Qty: 120 | Fill #3

## 2021-08-09 NOTE — Unmapped (Signed)
BREAST MEDICAL ONCOLOGY  -----------------------------------------------------------------------------------------------------  Follow-up Outpatient Evaluation    PCP: [name], MD     Consulting Physicians: Surgical oncology: Tyson Alias M.D.     Reason for Visit: Here for management of breast cancer.  -----------------------------------------------------------------------------------------------------  I personally spent 40 minutes face-to-face and non-face-to-face in the care of this patient, which includes all pre, intra, and post visit time on the date of service.  -----------------------------------------------------------------------------------------------------  Assessment:  Brooke [name] is a 65 y.o. 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     Brooke [name] presents today for imaging review and pretreatment evaluation on Xeloda, tucatinib + Herceptin after recent COVID infection 07/17/21. Capecitabine was held 07/19/21 during infection/treatment.  Brooke [name] has no clinical evidence of local progression or signs or symptoms consistent for distant progression.  We reviewed her CT C/A/P and NM bone scans 08/10/21 which showed ongoing treatment response. Brooke [name] is doing well overall. Brooke [name] appears to be recovering from her recent COVID infection. Discussed plan to restart capecitabine. Patient is understanding and amenable. Brooke [name] has been feeling increasingly anxious and more depressed. I will refer her to [facility] for further evaluation. Our pharmacy team will reach out in 1 month to assess Xeloda tolerance. Brooke [name] will repeat labs in 1 month. RTC in 2 months for provider visit + labs. Brooke [name] will reach out in the interim if Brooke [name] has any questions or concerns.     1. Metastatic breast cancer, ER-, PR+, HER-2 positive to lung, br  A. Systemic therapy    First-line: THP. Taxol d/c for G3 neuropathy.  Pertuzumab d/c for G3 diarrhea.  Not progression.   Second line: Trastuzumab + AI -->Tamoxifen 2/2 arthralgias.   Third line: Rebiopsy ER -/PR 10%/HER-2 +ve. C1 TDM1 03/23/20 limited by neuropathy. 09/01/20: DR TDM1 to 2.4 mg/kg. Discontinue TDM1 d/t abnormal LFT's and not progression.   Fourth line:  Herceptin + tucatinib + capecitabine (Xeloda). 05/20/21 DR capecitabine to 1000 mg BID, and tucatinib to 250 mg daily d/t diarrhea. D/t issues with adherence, her capecitabine dose was changed to 500 mg po BID on a continuous schedule on 07/06/21.  Subsequent lines:  Consider return to TDM1 or Enhertu on progression predicated on LFTs.    B.  Systemic imaging  -- CT C/A/P 8/16/2: stable disease. Interval reduction in size of left lower lobe mass. NM BS 08/10/21: No evidence of osseous metastatic disease.    C.  Brain mets/neuroimaging   Surgery: 01/15/19: resection of the right frontal met. [+++] MBC.  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   07/14/21: MRI brain: SD.    D.  Molecular  Genomics:  STRATA: ERBB2, PIK3CA.  Not eligible for HARMONY.  Genetic: Referral submitted in 03/2020, patient was never reached. Will f/u STAT DNA lab testing and Econsultation in next visit.   Tumor Markers: Checked today.    E. Radiation:   09/05/17: CK to 7 brain lesions. 09/13/18: CK (1#) right 9 mm lesion. 2/28 - 02/28/2019: CK 25 Gy in 5# to left frontal resection bed.     F. 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 Barbette Merino who thought not likely consequential but recommended CV cards eval.   > F/u with Dr. Harland German.   >  Echo 12/30/20: EF 60-65%.     2.  Comorbidities/supportive care   > Dysphagia/GERD/hemoglobin drop: Continue Nexium/famotidine.  TTG positive.  Given stable Hgb will hold on endoscopy for now.  F/u with Dr. Randye Lobo.  Gluten-free diet. Improved.   > Depression/Anxiety: Venlafaxine 225 mg. C/w clonazepam. F/u psychiatry/[facility] (Dr. Vertell Limber). Will get PAM  > Peripheral neuropathy: Venlafaxine 225 mg. C/w Lyrica dose 200 mg BID.  Paraneoplastic panels unremarkable. Dose reduced TDM1.  Did not tolerate methadone due to sedation/dizziness. Follows with palliative care. Can refer back to neurology to consider TENS versus scrambler therapy.   > Smoking cessation: Previously encouraged to stop smoking. 1 ppd currently. Previously prescribed Chantix.   > Insomnia: Continue trazodone 75mg .  No longer taking Remeron.   > Arthralgias: C-spine x-ray showed DJD.  Muscle relaxant by PCP. Referred to PT for back previously. Currently receiving neck PT.  If no improvement refer back to Dr. Glenice Laine. Consider PCI. Nondisplaced right hip fracture: Has morphine patches for mgmt.   > Wheezing:  Previous recommendations made to her to reschedule pulm appointment; recommended by PCP for COPD eval, and to again  consider reducing smoking.  Following with pulmonary, Dr. Wellington Hampshire, MD. NM lung scan 03/2021 WNL. Thought to be cardiology related.    > Cirrhosis. Abnormal LFTs. Follows with GI, Dr. Raford Pitcher. No more alcohol. Will recheck LFTs today.    > Iron deficiency/fatigue: Calculated iron deficit 1.5 g.  IV iron 12/11/20 and 01/19/21. Thalassemia testing 12/29/2020 does not show hereditary link.    >Scapulothoracic bursitis: Consider steroid injections. Referred to Dr. Glenice Laine.    >Health Maintenance: COVID-19 vaccinated. Had booster.   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 Applying foot and heel lotion. Improved  > Constipation: Continue taking senna. Recommended prune juice.   > Memory loss/brain fog: continues. MRI shows SD.     3.  Follow-up   -- Resume Capecitabine  -- Continue Tucatinib + IV Trastuzumab   -- Get [facility] in 2 weeks  -- Pharmacy will f/u in 1 month with call  -- Repeat labs in 1 month.   -- RTC in 2 months for provider visit + labs  -----------------------------------------------------------------------------------------------------  Interval history:  Brooke [name] is a 65 y.o. F who presents today accompanied by her sister, Misty Stanley, and friend, [name], for follow up on Xeloda, Tucatinib, Herceptin. In the interim, Brooke [name] had an ED visit and tested positive for COVID 07/17/21 and was treated with paxlovid. Brooke [name] endorses worsening neuropathic pain to b/l hands and feet with ongoing mid-upper back pain and increasing number of HA's following COVID infection.   -- Lingering cough intermittent throughout the day.   -- No SOB at rest. DOE.   -- Insomnia: Forgot to take trazodone.  -- Appetite fair. Weight stable.   -- Energy levels are stable.   -- Low motivation. Brooke [name] feels increasingly depressed and anxious.  -- Mild nausea. Denies vomiting.   -- No new breast related complaints  -- Full 12 ROS reviewed and otherwise mild/none.    Social history update: Sister Lisa's granddaughter recently passed away and her grandson was diagnosed with brain cancer. Has a boyfriend, [name], Brooke [name] has a son, [name]. Brooke [name] has another sister, [name], who recently had a new great-grandchild.     Review of Systems: A complete twelve systems review was obtained and is positive per the HPI but otherwise negative or unchanged. See MIMS #1170 where available.    ONC HISTORY  Metastatic HR + HER-2 overexpressing breast cancer to  lung and brain.  First-line and second line: Trastuzumab + AI -->Tamoxifen d/t arthralgias.  3rd line:  TDM1.  4th line: capecitabine (xeloda), tucatinib, herceptin    Consider [id number], Enhertu on progression.     Radiation: 09/05/17: CK to 7 brain lesions. 09/13/18: CK (1#) right 9 mm lesion. 2/28 - 02/28/2019: CK 25 Gy in 5# to left frontal resection bed.  Surgery: 01/15/19: resection of the right frontal met.  Genomic: STRATA: ERBB2, PIK3CA.  Not eligible for HARMONY.    2017 or earlier:  R breast wound and nipple inversion which patient describes herself as ignoring for some time    02/04/16:  Mammogram R breast showing 3.1 cm irregular spiculated dense mass in lateral Rt breast w nipple and skin retraction. 2 subcentimeter irregular masses in the RUOQ  (10', 11') (possible satellite lesions). Large Rt axillary lymph node 1.7. Lt breast clear. BIRAD 5.    02/08/16:  Core biopsy R axillary LN (breast bx deferred due to open wound) Gr 3, IDC with apocrine features. ER (-), PR (31-40), HER-2 (3+). Noted to have multiple skin lesions.    02/10/16:  Initial staging with CT CAP: Large, necrotic right breast mass with wide open tract to the skin, consistent with known right breast malignancy.  Numerous enlarged right axillary, subpectoral, mediastinal, bilateral hilar lymph nodes and innumerable bilateral pulmonary nodules.  Bone scan negative for osseous mets.    --Started trastuzumab initially with THP.  Taxol d/c after three cycles for G3 neuropathy.  Pertuzumab d/c for G3 diarrhea.  Not discontinued due to progression.    04/28/16:  Switched to anastrozole/trastuzumab  --patient had trouble tolerating AIs, switched to exemestane    07/13/16: genomic testing with STRATA showing ERBB2 copy # alteration, PIK3CA p.[id number]    08/01/17:  MRI showed multiple small brain metastases   --completed CK to 7 lesions on 09/05/17.   --CK to the right 9 mm lesion (1 fx on 09/13/18).    --continued on AI/trastuzumab    11/2018:  progression of her frontal lobe lesion.   --resection of the right frontal mass by Dr. Tresa Garter on 01/15/2019.  Pathology was consistent with metastatic breast cancer.  --2/28 - 02/28/2019: CK 25 Gy in 5# to left frontal resection bed     04/2019: switched to tamoxifen/trastuzmab due to intolerance of AIs (joint pain    09/2019:  Concern for possible slight extra-cranial progression in LLL lung mass, elected to continue on current tx    01/03/20:  Repeat CT with new lingular 1.9 cm nodular opacity; differential diagnosis includes new metastatic lesion and consolidative/infectious opacity. CT chest 1 month is recommended to help discriminate between these two etiologies.  Also slight increase R breast mass.    02/03/20:  Repeat CT again with lingular 1.9 cm nodule unchanged to slightly increased in size, left lower lobe 3.5 cm mass slightly increased in size. Discussed at [facility] due to concern that lingular mass might represent lung primary in current smoker. Recommended biopsy.      01/2020:  Changed to fulvestrant/trastuzumab due to progression    03/05/20:  FNA lung biopsy showing carcinoma c/w breast primary (GATA3+, TTF1-), ER neg/PR 10%/HER2 3+.  Switched to [employer]. C1D1 03/23/20.    03/29/21: Switched to capecitabine (xeloda), tucatinib, herceptin d/t abnormal LFT's.      Onc Family History:   - Father: prostate  - Mat grandfather: prostate  - Mat uncle: lung  - Pat aunt: breast   - Great niece: adrenal  Social History:   Social History     Social History Narrative    Brooke [name] lives in senior housing in [place] x2 months. he has 4 sisters, 2 nearby and involved in care. Brooke [name] has 1 son-[name], recently married 81 yo, who lives about an hour and a half away in [place]. He works full-time as a Sales executive. Brooke [name] is still a chronic smoker (1ppd). Brooke [name] is currently divorced.         Goes to [employer] in [place] and gets a lot of support. Her church friend [name] and her sisters goes with her to chemo appts.     Physical Examination:   Vital Signs: BP 146/70  - Pulse 77  - Temp 36.7 ??C (98.1 ??F) (Temporal)  - Resp 20  - Wt 56.7 kg (124 lb 14.4 oz)  - LMP  (LMP Unknown)  - SpO2 96%  - BMI 22.69 kg/m??  See flow sheet  General: Chronically ill appearing female in no acute distress.    HEENT: Well-healed (R) frontal scalp scar. EOMI. Sclerae anicteric.   Neck: Supple. No cervical or supraclavicular adenopathy.   Pulm: Lungs clear to auscultation bilat; breathing non-labored. Mild coarse breath sounds.   Cardiac: Regular rate and rhythm; no LE edema.    Abdominal: Mild fullness to RUQ, over liver.   Musculoskeletal: Tenderness to b/l lower scapula.    Breasts/chest: (R) breast: Nipple autolyzed. Small ulcer laterally to redeveloping nipple closed. (L) breast exam: Port to the upper left chest.    Neurologic:  Alert and oriented. Grossly non-focal  Lymphatic: No cervical or supraclavicular adenopathy. No palpable right axillary adenopathy.  Skin: Normal.   Extremity: Callus on right foot.     DATA REVIEW:    Pertinent labs/imaging/pathology reviewed in detail.    Scribe Statement: Documentation assistance was provided by me personally, Marcy Salvo, a scribe. Olivia Mackie, MD obtained and performed the history, physical exam and medical decision making elements that were entered into the chart. Signed by Marcy Salvo, 08/10/21 at 3:56 PM    Provider Attestation: Documentation assistance was provided by the Scribe, Marcy Salvo. I was present during the time the encounter was recorded. The information recorded by the Scribe was done at my direction and has been reviewed and validated by me. Signed by Olivia Mackie, MD 08/10/21 at 4:27 PM

## 2021-08-10 ENCOUNTER — Institutional Professional Consult (permissible substitution): Admit: 2021-08-10 | Discharge: 2021-08-11 | Payer: MEDICARE

## 2021-08-10 ENCOUNTER — Ambulatory Visit: Admit: 2021-08-10 | Discharge: 2021-08-11 | Payer: MEDICARE

## 2021-08-10 ENCOUNTER — Ambulatory Visit
Admit: 2021-08-10 | Discharge: 2021-08-11 | Payer: MEDICARE | Attending: Geriatric Medicine | Primary: Geriatric Medicine

## 2021-08-10 DIAGNOSIS — Z171 Estrogen receptor negative status [ER-]: Principal | ICD-10-CM

## 2021-08-10 DIAGNOSIS — C7931 Secondary malignant neoplasm of brain: Principal | ICD-10-CM

## 2021-08-10 DIAGNOSIS — C50919 Malignant neoplasm of unspecified site of unspecified female breast: Principal | ICD-10-CM

## 2021-08-10 DIAGNOSIS — C7802 Secondary malignant neoplasm of left lung: Principal | ICD-10-CM

## 2021-08-10 DIAGNOSIS — C50811 Malignant neoplasm of overlapping sites of right female breast: Principal | ICD-10-CM

## 2021-08-10 LAB — CBC W/ AUTO DIFF
BASOPHILS ABSOLUTE COUNT: 0 10*9/L (ref 0.0–0.1)
BASOPHILS RELATIVE PERCENT: 0.2 %
EOSINOPHILS ABSOLUTE COUNT: 0 10*9/L (ref 0.0–0.5)
EOSINOPHILS RELATIVE PERCENT: 0.7 %
HEMATOCRIT: 37.4 % (ref 34.0–44.0)
HEMOGLOBIN: 12.6 g/dL (ref 11.3–14.9)
LYMPHOCYTES ABSOLUTE COUNT: 2.1 10*9/L (ref 1.1–3.6)
LYMPHOCYTES RELATIVE PERCENT: 34.5 %
MEAN CORPUSCULAR HEMOGLOBIN CONC: 33.7 g/dL (ref 32.0–36.0)
MEAN CORPUSCULAR HEMOGLOBIN: 34.6 pg — ABNORMAL HIGH (ref 25.9–32.4)
MEAN CORPUSCULAR VOLUME: 102.7 fL — ABNORMAL HIGH (ref 77.6–95.7)
MEAN PLATELET VOLUME: 7.1 fL (ref 6.8–10.7)
MONOCYTES ABSOLUTE COUNT: 0.5 10*9/L (ref 0.3–0.8)
MONOCYTES RELATIVE PERCENT: 7.8 %
NEUTROPHILS ABSOLUTE COUNT: 3.4 10*9/L (ref 1.8–7.8)
NEUTROPHILS RELATIVE PERCENT: 56.8 %
NUCLEATED RED BLOOD CELLS: 0 /100{WBCs} (ref <=4)
PLATELET COUNT: 148 10*9/L — ABNORMAL LOW (ref 150–450)
RED BLOOD CELL COUNT: 3.64 10*12/L — ABNORMAL LOW (ref 3.95–5.13)
RED CELL DISTRIBUTION WIDTH: 19.3 % — ABNORMAL HIGH (ref 12.2–15.2)
WBC ADJUSTED: 6 10*9/L (ref 3.6–11.2)

## 2021-08-10 LAB — COMPREHENSIVE METABOLIC PANEL
ALBUMIN: 3.6 g/dL (ref 3.4–5.0)
ALKALINE PHOSPHATASE: 214 U/L — ABNORMAL HIGH (ref 46–116)
ALT (SGPT): 22 U/L (ref 10–49)
ANION GAP: 3 mmol/L — ABNORMAL LOW (ref 5–14)
AST (SGOT): 32 U/L (ref <=34)
BILIRUBIN TOTAL: 0.4 mg/dL (ref 0.3–1.2)
BLOOD UREA NITROGEN: 7 mg/dL — ABNORMAL LOW (ref 9–23)
BUN / CREAT RATIO: 11
CALCIUM: 8.6 mg/dL — ABNORMAL LOW (ref 8.7–10.4)
CHLORIDE: 109 mmol/L — ABNORMAL HIGH (ref 98–107)
CO2: 26.5 mmol/L (ref 20.0–31.0)
CREATININE: 0.65 mg/dL
EGFR CKD-EPI (2021) FEMALE: >90 mL/min/{1.73_m2} (ref >=60)
GLUCOSE RANDOM: 92 mg/dL (ref 70–179)
POTASSIUM: 3.5 mmol/L (ref 3.4–4.8)
PROTEIN TOTAL: 7.1 g/dL (ref 5.7–8.2)
SODIUM: 138 mmol/L (ref 135–145)

## 2021-08-10 MED ADMIN — iohexoL (OMNIPAQUE) 350 mg iodine/mL solution 100 mL: 100 mL | INTRAVENOUS | @ 16:00:00 | Stop: 2021-08-10

## 2021-08-10 MED ADMIN — Technetium Tc-99m Oxidronate HDP: 21 | INTRAVENOUS | @ 15:00:00 | Stop: 2021-08-10

## 2021-08-10 NOTE — Unmapped (Signed)
It was a pleasure to see you today in the Medical Oncology Clinic.  Please call our nurse navigator, Modena Jansky, if you have any interval questions or concerns:     For appointments, please call [phone]     Nurse Navigator:   Modena Jansky, RN: phone: [phone]     Prescription refills: [phone]     FAX: [phone]     For emergencies, evenings or weekends, please call [phone] and ask for the oncology fellow on call.     Reasons to call emergency line may include:   Fever of 100.5 or greater   Nausea and/or vomiting not relieved with nausea medicine   Diarrhea or constipation not relieved with bowel regimen   Severe pain not relieved with usual pain regimen     Labs from today:  Lab Results   Component Value Date    WBC 6.0 08/10/2021    HGB 12.6 08/10/2021    HCT 37.4 08/10/2021    MCV 102.7 (H) 08/10/2021    PLT 148 (L) 08/10/2021       Lab Results   Component Value Date    NEUTROABS 3.4 08/10/2021         Chemistry        Component Value Date/Time    NA 138 08/10/2021 1039    K 3.5 08/10/2021 1039    CL 109 (H) 08/10/2021 1039    CO2 26.5 08/10/2021 1039    BUN 7 (L) 08/10/2021 1039    CREATININE 0.65 08/10/2021 1039    CREATININE 0.8 01/28/2021 1508    GLU 92 08/10/2021 1039        Component Value Date/Time    CALCIUM 8.6 (L) 08/10/2021 1039    ALKPHOS 214 (H) 08/10/2021 1039    AST 32 08/10/2021 1039    ALT 22 08/10/2021 1039    BILITOT 0.4 08/10/2021 1039            Lab Results   Component Value Date    ALT 22 08/10/2021    AST 32 08/10/2021    ALKPHOS 214 (H) 08/10/2021    BILITOT 0.4 08/10/2021       Future Appointments   Date Time Provider Department Center   09/01/2021  2:00 PM Burnis Kingfisher, FNP [facility]   09/21/2021 11:30 AM Janyth Pupa, MD [facility]   11/11/2021 11:20 AM [name], MD [facility]   11/17/2021  8:45 AM [facility]   11/17/2021 11:00 AM Colette Buford Dresser, MD [facility]

## 2021-08-12 NOTE — Unmapped (Signed)
[facility] Refill Coordination Note    Specialty Medication(s) to be Shipped:   Hematology/Oncology: [name] 150mg     Other medication(s) to be shipped: No additional medications requested for fill at this time     Brooke [name], DOB: 1956/07/05  Phone: [phone] (home)       All above HIPAA information was verified with patient's sister     Was a translator used for this call? No    Completed refill call assessment today to schedule patient's medication shipment from the [facility] [phone]).  All relevant notes have been reviewed.     Specialty medication(s) and dose(s) confirmed: Regimen is correct and unchanged.   Changes to medications: Karys reports no changes at this time.  Changes to insurance: No  New side effects reported not previously addressed with a pharmacist or physician: None reported  Questions for the pharmacist: No    Confirmed patient received a Conservation officer, historic buildings and a Surveyor, mining with first shipment. The patient will receive a drug information handout for each medication shipped and additional FDA Medication Guides as required.       DISEASE/MEDICATION-SPECIFIC INFORMATION        N/A    SPECIALTY MEDICATION ADHERENCE     Medication Adherence    Patient reported X missed doses in the last month: 0  Specialty Medication: Tukysa 150mg   Patient is on additional specialty medications: No  Informant: other relative              Were doses missed due to medication being on hold? No    Tukysa 150 mg: 10 days of medicine on hand       REFERRAL TO PHARMACIST     Referral to the pharmacist: Not needed      [facility]     Shipping address confirmed in Epic.     Delivery Scheduled: Yes, Expected medication delivery date: 08/17/21.     Medication will be delivered via Next Day Courier to the prescription address in Epic WAM.    [name]   [facility] Specialty Technician

## 2021-08-13 ENCOUNTER — Ambulatory Visit: Admit: 2021-08-13 | Discharge: 2021-08-14 | Payer: MEDICARE

## 2021-08-13 DIAGNOSIS — C50811 Malignant neoplasm of overlapping sites of right female breast: Principal | ICD-10-CM

## 2021-08-13 DIAGNOSIS — R110 Nausea: Principal | ICD-10-CM

## 2021-08-13 DIAGNOSIS — K900 Celiac disease: Principal | ICD-10-CM

## 2021-08-13 DIAGNOSIS — C50919 Malignant neoplasm of unspecified site of unspecified female breast: Principal | ICD-10-CM

## 2021-08-13 MED ADMIN — heparin, porcine (PF) 100 unit/mL injection 500 Units: 500 [IU] | INTRAVENOUS | @ 16:00:00 | Stop: 2021-08-13

## 2021-08-13 MED ADMIN — sodium chloride (NS) 0.9 % infusion: 100 mL/h | INTRAVENOUS | @ 13:00:00 | Stop: 2021-08-13

## 2021-08-13 MED ADMIN — trastuzumab (HERCEPTIN) 380 mg in sodium chloride (NS) 0.9 % 250 mL IVPB: 6 mg/kg | INTRAVENOUS | @ 15:00:00 | Stop: 2021-08-13

## 2021-08-13 NOTE — Unmapped (Signed)
Pt tolerated chemo infusion without diffculty today.  Pt was stable at discharge via self ambulation with sister.

## 2021-08-13 NOTE — Unmapped (Addendum)
Brief Adult Oncology Infusion Clinic Note:    Contacted by the infusion nurse for treatment plan unsigned.    Patient presenting for C6D1 Trastuzumab, but the treatment plan has not been signed by the oncologist. Notified Dr. Kerrin Champagne. Plan signed and infusion nurse notified.     Chemotherapy clarification: Dr. [name] would like to proceed with current dose of treatment today with current weight.                           Montel Culver, ANP  ADVANCED PRACTICE PROVIDER FOR INFUSION PROGRAM  PAGER:[phone]  OFFICE: [phone]

## 2021-08-14 DIAGNOSIS — F5101 Primary insomnia: Principal | ICD-10-CM

## 2021-08-14 MED ORDER — TRAZODONE 50 MG TABLET
ORAL_TABLET | Freq: Every evening | ORAL | 0 refills | 0 days
Start: 2021-08-14 — End: ?

## 2021-08-16 MED FILL — TUKYSA 150 MG TABLET: ORAL | 30 days supply | Qty: 60 | Fill #1

## 2021-09-01 ENCOUNTER — Telehealth: Admit: 2021-09-01 | Discharge: 2021-09-02 | Payer: MEDICARE

## 2021-09-01 DIAGNOSIS — C7802 Secondary malignant neoplasm of left lung: Principal | ICD-10-CM

## 2021-09-01 DIAGNOSIS — Z515 Encounter for palliative care: Principal | ICD-10-CM

## 2021-09-01 NOTE — Unmapped (Signed)
OUTPATIENT ONCOLOGY PALLIATIVE CARE    Principal Diagnosis: Brooke [name] is a 65 y.o. female with metastatic breast cancer,  diagnosed in 2017.  Disease sites include lung and brain.     Assessment/Plan:   1. Painful neuropathic worsening in hands and feet-continued mid upper back pain and more recently headache with diagnosis of COVID (07/18/21).  MRI brain 07/14/2021 stable.  Hx of cervical thoracic paraspinal trigger point bilateral this past July 2022 with not as much relief as she has had in the past.  Has not retried the Suboxone at the one fourth film dosing.  Did take hydrocodone 10/325 mg a couple times recently with good relief for head pain.  Headaches appear to be stress related because they come at the end of the day and usually starts with discomfort in her shoulders first.    -Continue lyrica 200 mg bid dosing-some relief with this.  -Restart Suboxone at 1/4 film (has 2 mg films) (0.5mg ) daily x 1 day and if tolerating increase to twice daily dosing.  The Suboxone 2 mg dosing was too sedating and produced dizziness. Will cut films into quarters.   -Reviewed that I felt that the Suboxone can help with the neuropathic pain as well.  -Will connect with Dr. Kerry [name] and Dr. Ruffin [name] about resuming the hydrocodone 10/325 mg-perhaps 1 to 2 tablets/day maximum dose.  -Continue Effexor 225 mg every day  -Continue diclofenac gel as needed  -Not able to tolerate NSAIDs (due to platelets) and tylenol (recent elevated liver enzymes)      Opioid use in past:  -Had difficulty tolerating the methadone 2.5 mg dosing due to sedation.  Did not try the 1 mg methadone, so could consider this in the future if needed for the neuropathic discomfort.  Some reluctance in re-starting methadone.  -No relief with tramadol  -oxycodone-night terrors.    2.  Diarrhea last 2 weeks, but improved in the last couple days.  Down to 3 times a day yesterday and stools are not as loose as they have been.  Did have cramps and gas last week, but no longer presently.  Had her first full meal yesterday.  Was  taking Imodium and Lomotil onset of last Monday every 4 hours, but decreased the dosing now due to improvement in her symptoms.  -Continue as needed Imodium and Lomotil.  -Will connect with Brooke [name] CPP.  Brooke [name] she does have Brooke's phone number.      3. Support/mood-appearing discouraged at times.  Shares that its been frustrating getting the in-home support and now she does not feel that she needs it.  Has not had a visit with [facility] recently but is receptive to this.  Struggling to get her phone set up to do video visits and would like this connection with the [name].  Sharing struggles of her ex-husband having to undergo another leg surgery.  Remains grateful for [facility] care.  -Suspect that physical symptoms are contributing to depressed mood.  -Discussed ways of calming and uplifting her mood.  -Will connect with Brooke [name] social worker.  -Will see in clinic on 9/27 and see if I can help her with her phone to do video visit.      4.  Goals of care    At prior visits: goals which are to decrease pain and to maintain her independence. Lives alone and Brooke [name] lives close by.     Wants to ensure that her quality of life remains high  and the wish to keep on going.  [name] concern that she does not want to be in pain.  NP provided reassurance that our team can offer her different treatment strategies to help her with her goal.     Advance care planning-see advance care planning note dated 01/31/2020.  -at prior visits: Patient [name] that she has been dreaming about death. She does have her funeral arrangements completed. [name] that she has her wishes written down and her Sister Brooke [name] knows where they are regarding her funeral. She still contemplating whether to be cremated or buried and she is leaning toward cremation. patient currently focused on cancer directed therapy.  Prefers to stay in the moment and not discussed things.  We will continue to support and address if she has a change in clinical condition.        -Advance directives scanned in on 11/02/19        HCDM [facility]): [name] - Sister - [phone]    HCDM, [name] - Sister - [phone]    At prior visits,   # Controlled substances risk management.  We are not currently prescribing controlled medications for her.   - Patient has a signed pain medication agreement with Outpt Palliative care, completed on 11/01/18, as per standard care. This was signed again on 01/28/19   - [place] database was reviewed today and it was appropriate.   - Urine drug screen was not performed at this visit. Findings: not applicable.   - Patient has received information about safe storage and administration of medications.   - Patient has received a prescription for narcan and [name] with Brooke [name] and pt.       F/u: 9/27 clinic    ----------------------------------------  Referring Provider: From inpatient oncology team  Oncology Team: Breast team-Dr. Archie [name]  PCP: Brooke Milliner, MD      HPI: 65 year old woman with her to overexpressing metastatic breast cancer to her lung and brain.  Was found to have multiple small brain metastases and completed CyberKnife therapy on September 11. Describes ongoing pain in her pelvis, rates it as a 3 out of 10 today.  Has been improving over the last week or 2.  Feels like gabapentin has been helpful, and is also responded well to Tylenol and ibuprofen in the past.  She is taken oxycodone and it gave her night terrors does not want to take it again.  Has taken Dilaudid previously and did not have side effects from this.    Current cancer-directed therapy: capecitabine (Xeloda), tucatinib, and trastuzumab (Herceptin).       Interval hx 06/30/21 [facility] and Loreli    -More engaged with boyfriend-Brooke [name] and spending more time with him. Went on a motorcycle ride this past w/e.  -Has enjoyed spending more time with Orvilla Fus (he isn't spending as much time playing golf). He has been helping with cooking and her medications.   -Sleeping better. It has been a good 2 wks.  -Planning to go to beach at the end of Aug.   -NP inquired as if Brooke [name] drinks alcohol and Kashish confirmed that he does but it is socially.  Shares that she does drink with him, but limits it to 1 glass and does not use the alcohol with the opioids.  -Acknowledged that its been good to have Brooke [name] be with her because outside of Brooke [name] her sister really nobody else is visiting.    -No diarrhea until this am. Didn't have diarrhea x 2 wks. Feels like she  isn't going to have any more. Xeloda last dose was   -Takes antinausea med prior [place] and this helps.     -Cleaned out her medicine cabinet-it is more organized now.   -Not using the pill box-hard to open and reading pill bottles. Reading the bottles prior to taking.     -Pain spike yesterday-upper back-mid back towards right side and attributes this to moving more yesterday.    -Neuropathy has changed-worsening.  Has intense toes and feet sensations.  Feels like there is pain shooting out from her toes and at the top of her feet are swollen, but they are not.  Hands/fingertips losing some sensation, but better today.   -Hasn't used butrans patch since last Friday.  Did not feel that the Butrans patch even at the 15 mcg per dosing was effective.  Has not taken any opioids since the Butrans patch last Friday.  -Rare headache and right ear pain    Interval history 08/04/2021 CK and [name]    -Slowly recovering from COVID.  Reports that she still spends most of the time in bed.  -Continues to have a cough and a heavy sensation feeling in her chest and at times feels hot.  Overall she is slowly getting better.  -Feels that she could eat real food now that her sense of smell is slowly returning.  Was able to eat eggs and bacon for breakfast this morning.  -Continues to have a headache. Right ear hearing has improved.  -Spine injections really did not help the upper back pain like they did in the past.  -The Suboxone 2 mg dosing was too sedating and produce dizziness as well.  -Shares that there is so much going on with the rest of her body has not been really paying attention to the neuropathic pain that she has in her feet.  -Slept well last night and attributes this to the Imodium working.  -Was able to go to the grocery store today.    Interval hx 09/01/21    -Comments that its been a difficult month in August-emotionally and physically.  -Feels that the hand and feet neuropathy is worsening.  Describes it as a needle stabbing pains that she is experiencing in her hands and then itching and stinging sensation in her feet.  Does feel that the Lyrica helps because when she misses a dose the pain is actually worse.  -Headaches in the evening feels discomfort in her upper back and then later has a headache.  Feels that stress makes the headache worse as well.  History of migraines in the past.  -Has not tried the Suboxone yet.  Did take hydrocodone 10/325 mg a couple tablets and this seemed to help had pain in the little bit with the nerve pain.  -Had bad diarrhea for the last 2 weeks, but has improved in the last couple days.  Last Monday she started taking Lomotil and Imodium 1 tab every 4 hours and that seemed to help.  Did have acute cramping and gas but not now.  -Feels like she is drinking enough water.  -She did get a new phone but is having hard time doing the video visits.  -Comments that she knows she lost some weight because of the diarrhea.      Palliative Performance Scale: 70% - Ambulation: Reduced / unable to do normal work, some evidence of disease / Self-Care: Full / Intake: Normal or reduced / Level of Conscious: Full  Coping/Support Issues: Patient reports she is been able to attend church functions and is finding this very helpful. Has boyfriend [name] for the last 15 years.     At prior visits, patient did share that she continues to receive much help from her church friend, [name] and her husband.  They prayed together and she is found this supportive.  She states that her 2 sisters have been supportive, but she feels that they are becoming more less patient with her due to patient's irritability.     Overall coping well, no specific issues identified.  Finding support with her church community, her sister, Brooke [name], and prayer.         Social History: Pt lives in senior housing in [place] month, says it's ok and quiet but she is from [place] and liked it there more. She has 4 sisters, 2 nearby and involved in care. She has 1 son-[name], 35yo, who lives about an hour and a half away in [place]. She is divorced.  ??  Goes to [employer] in [place] and gets a lot of support. Her church friend [name] goes with her to chemo appts. Her sisters will go too.    Advance Care Planning: Advance directives scanned into system  HCPOA: See ACP note  Living Will: See ACP note  ACP note: Yes, advance directive scanned in on November 6    Objective     Opioid Risk Tool:      Opioid Risk Tool:   Female  Female    Family history of substance abuse      Alcohol   1  3    Illegal drugs  2  3    Rx drugs  4  4    Personal history of substance abuse      Alcohol  3  3    Illegal drugs  4  4    Rx drugs  5  5    Age between 16--45 years  1  1    History of preadolescent sexual abuse  3 0    Psychological disease      ADD, OCD, bipolar, schizophrenia  2  2    Depression  1  1    Total: 7  (<3 low risk, 4-7 moderate risk, >8 high risk)      Oncology History Overview Note   Identifying Statement:  Chistine Dematteo is a 65 y.o. female diagnosed with a right posterior frontal lobe metastasis (breast primary) status post resection 01/15/2019 and 5/5 fractions of SBRT (2500 Gy via CyberKnife) to the resection cavity.    Treatment History:  09/05/17: S/p SRS to 7 lesions  01/15/19: S/p resection, followed by SRS 2500 cGy   03/04/19: KPS 80; MRI with postsurgical changes versus residual disease; RTC in 2 weeks w/ MRI   04/15/19: KPS NA; MRI w/ SD; no study; RTC 6 weeks w/ MRI   06/10/19: KPS 80; MRI w/ SD; RTC 4 weeks  07/15/19: KPS 80; MRI w/ SD; RTC 8 weeks  08/19/19: KPS 80; start nortriptyline for HAs; RTC 4 weeks w/ MRI  09/16/19: KPS 80; con't nortriptyline for HAs; RTC 2 mos w/ MRI  11/11/19: KPS 80; MRI w/ SD though w/ small infarct; proceed w/ CVA workup; start 81mg  ASA; encouraged smoking cessation; con't nortriptyline; RTC to discuss results  11/25/19: KPS 80; discussed CVA workup results; MRA unremarkable; con't smoking cessation efforts; con't nortriptyline for HAs; con't 81mg  ASA, start Lipitor; f/u with PCP for  COPD eval; RTC 2 mos w/ MRI     Malignant neoplasm of overlapping sites of right female breast (CMS-HCC)   2017 -  Presenting Symptoms    Large open wound RT breast which began as nipple inversion. Patient states that she ignored for a long time.  Physical exam of the area of concern in the RTbreast demonstrates a large open wound in the lateral right breast. Saw PCP 01/27/16 Rx Keflex.     02/04/2016 Interval Scan(s)    MMG/US: 3.1 (3.0) cm irregular spiculated dense mass in lateral Rt breast w nipple and skin retraction. 2 subcentimeter irregular masses in the RUOQ  (10', 11') (possible satellite lesions). Large Rt axillary lymph node 1.7. Lt breast clear. BIRAD 5.     02/08/2016 Biopsy    US guided Rt. Axillary LN biopsy:  Gr 3, IDC with apocrine features. ER (-), PR (31-40), HER-2 (3+). Noted to have multiple skin lesions. Poor access to breast mass due open 10-15 cm wound risk of pain and bleeding.     02/09/2016 Initial Diagnosis    Malignant neoplasm of overlapping sites of right female breast (RAF-HCC)     02/10/2016 Interval Scan(s)    CT CAP: Large, necrotic right breast mass with wide open tract to the skin, consistent with known right breast malignancy.  Numerous enlarged right axillary, subpectoral, mediastinal, bilateral hilar lymph nodes and innumerable bilateral pulmonary nodules     02/10/2016 Interval Scan(s)    NM Bone scan: No osseous metastatic disease.     04/28/2016 - 03/10/2020 Chemotherapy    OP TRASTUZUMAB (EVERY 21 DAYS)  Trastuzumab 8 mg/kg loading then 6 mg/kg every 21 days     07/13/2016 Genetics    STRATA  ??? ERBB2 copy number alteration  Estimated copy number: 40, confidence interval: 35.7 - 44.0, cellularity: 50%  Associated FDA-approved targeted therapies in breast cancer: trastuzumab, lapatinib, ado-trastuzumab emtansine, pertuzumab  ??? PIK3CA [id number].E545A     01/03/2020 -  Cancer Staged    CT CAP 01/03/2020 which showed a new lingular opacity measuring 1.9 cm in the right lung. There was also slight increase in the right breast mass from 1.2-> 1.5 cm.  Bone scan shows no osseous metastases. Overall I considered these findings indeterminate and ppted to repeat CT CAP in 1 month     02/03/2020 -  Cancer Staged    CT chest 02/03/20 which showed that the lingular lesion has persisted and increased in size.  No mediastinal adenopathy reported.  She continues to report a nonproductive cough and mild dyspnea.  She is a chronic smoker and currently smokes on average half a pack per day.  My major concern is that this could be a second primary lung cancer as her other sites of diseases stable on current systemic therapy       02/03/2020 Endocrine/Hormone Therapy    Stop Tamoxifen, Add Fasoldex, continue Q3week Hercpetin     03/23/2020 - 02/09/2021 Chemotherapy    OP BREAST ADO-TRASTUZUMAB EMTANSINE  ado-trastuzumab emtansine 3.6 mg/kg IV on day 1, every 21 days     04/12/2021 -  Chemotherapy    Tucatinib + Capecitabine  OP TRASTUZUMAB (EVERY 21 DAYS)  trastuzumab 8 mg/kg IV LOADING, then 6 mg/kg IV MAINT, every 21 days     Brain metastases (CMS-HCC)   08/07/2017 Initial Diagnosis    Brain metastases (CMS-HCC)    Summary of Radiosurgery   Rx:7 brain met: 09/05/2017: 2,000/2,000 cGy  Sec:R Frontal: 09/05/2017: 2,000 cGy  Sec:L Post  Fr: 09/05/2017: 2,000 cGy  Sec:L Ant Fro: 09/05/2017: 2,000 cGy  Sec:R Sup Oc: 09/05/2017: 2,000 cGy  Sec:L [place]: 09/05/2017: 2,000 cGy  Sec:R Inf Occi: 09/05/2017: 2,000 cGy  Sec:L Tempor: 09/05/2017: 2,000 cGy  Rx:Lt Med Oc: 09/13/2018: 2,000/2,000 cGy  Rx:Rt Frontal: : 2,500/2,500 cGy    01/15/2019: Craniotomy and resection of right posterior frontal lobe metastasis. Followed by CK    03/04/19: KPS 80; MRI with postsurgical changes versus residual disease; RTC in 2 weeks w/ MRI     01/03/2020 -  Cancer Staged    CT CAP 01/03/2020 which showed a new lingular opacity measuring 1.9 cm in the right lung. There was also slight increase in the right breast mass from 1.2-> 1.5 cm.  Bone scan shows no osseous metastases. Overall I considered these findings indeterminate and ppted to repeat CT CAP in 1 month     02/03/2020 -  Cancer Staged    CT chest 02/03/20 which showed that the lingular lesion has persisted and increased in size.  No mediastinal adenopathy reported.  She continues to report a nonproductive cough and mild dyspnea.  She is a chronic smoker and currently smokes on average half a pack per day.  My major concern is that this could be a second primary lung cancer as her other sites of diseases stable on current systemic therapy       02/03/2020 Endocrine/Hormone Therapy    Stop Tamoxifen, Add Fasoldex, continue Q3week Hercpetin     Metastatic breast cancer (CMS-HCC)   01/25/2019 Initial Diagnosis    Metastatic breast cancer (CMS-HCC)     03/23/2020 - 02/09/2021 Chemotherapy    OP BREAST ADO-TRASTUZUMAB EMTANSINE  ado-trastuzumab emtansine 3.6 mg/kg IV on day 1, every 21 days     04/12/2021 -  Chemotherapy    Tucatinib + Capecitabine  OP TRASTUZUMAB (EVERY 21 DAYS)  trastuzumab 8 mg/kg IV LOADING, then 6 mg/kg IV MAINT, every 21 days         Patient Active Problem List   Diagnosis   ??? Malignant neoplasm of overlapping sites of right female breast (CMS-HCC)   ??? Peripheral neuropathy   ??? Insomnia   ??? Brain metastases (CMS-HCC)   ??? Nausea   ??? Closed fracture of one rib with routine healing   ??? Diarrhea of presumed infectious origin   ??? Failure to thrive in adult   ??? Tobacco use   ??? Chronic diarrhea   ??? Depression   ??? Hyponatremia   ??? Back pain   ??? Closed fracture of multiple pubic rami, right, initial encounter (CMS-HCC)   ??? Macrocytosis   ??? Pelvic fracture (CMS-HCC)   ??? Metastatic breast cancer (CMS-HCC)   ??? Migraine without aura and without status migrainosus, not intractable   ??? Essential hypertension   ??? CVA (cerebral vascular accident) (CMS-HCC)   ??? DDD (degenerative disc disease), cervical   ??? Wheezing   ??? Dyslipidemia   ??? Angina pectoris (CMS-HCC)   ??? Papillary fibroelastoma of heart   ??? Antineoplastic chemotherapy induced anemia   ??? Celiac disease   ??? Iron deficiency anemia due to chronic blood loss   ??? Dry eye syndrome, bilateral   ??? Meibomian gland dysfunction (MGD) of both eyes   ??? Glaucoma suspect of both eyes   ??? Incipient cataract of both eyes   ??? 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 Fatty liver       Past Medical History:   Diagnosis Date   ???  Abnormal ECG unsure   ??? Alcoholism (CMS-HCC)    ??? Allergic rhinitis    ??? Anemia    ??? Anxiety     insomnia   ??? Arthritis not sure   ??? Brain concussion    ??? Breast cancer (CMS-HCC)     chemo for now with mets   ??? COPD (chronic obstructive pulmonary disease) (CMS-HCC)    ??? Depression    ??? Dysphagia    ??? Fatty liver 07/08/2021   ??? Genitourinary disease    ??? GERD (gastroesophageal reflux disease)    ??? Headache    ??? HTN (hypertension)    ??? Hyperlipidemia    ??? Insomnia    ??? 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 Stroke (CMS-HCC) Nov. 2020    Dr. Theodoro Kalata   ??? Tobacco dependence        Past Surgical History:   Procedure Laterality Date   ??? CESAREAN SECTION     ??? CHOLECYSTECTOMY     ??? FINGER SURGERY Right     trigger finger   ??? GANGLION CYST EXCISION Left     hand   ??? HYSTERECTOMY     ??? LYMPH NODE BIOPSY Right 02/08/2016    Axillary   ??? PR BRNSCHSC TNDSC EBUS DX/TX INTERVENTION PERPH LES Left 03/05/2020    Procedure: Bronch, Rigid Or Flexible, Including Fluoro Guidance, When Performed; With Transendoscopic Ebus During Bronchoscopic Diagnostic Or Therapeutic Intervention(S) For Peripheral Lesion(S);  Surgeon: Jerelyn Charles, MD;  Location: MAIN OR [facility];  Service: Pulmonary   ??? PR BRONCHOSCOPY,COMPUTER ASSIST/IMAGE-GUIDED NAVIGATION Left 03/05/2020    Procedure: BRONCHOSCOPY, RIGID OR FLEXIBLE, INCLUDE FLUORO WHEN PERFORMED; W/COMPUTER-ASSIST, IMAGE-GUIDED NAVIGATION;  Surgeon: Jerelyn Charles, MD;  Location: MAIN OR [facility];  Service: Pulmonary   ??? PR BRONCHOSCOPY,DIAGNOSTIC W LAVAGE Left 03/05/2020    Procedure: Bronchoscopy, Rigid Or Flexible, Include Fluoroscopic Guidance When Performed; W/Bronchial Alveolar Lavage;  Surgeon: Jerelyn Charles, MD;  Location: MAIN OR [facility];  Service: Pulmonary   ??? PR BRONCHOSCOPY,TRANSBRON ASPIR BX Left 03/05/2020    Procedure: Bronchoscopy, Rigid/Flex, Incl Fluoro; W/Transbronch Ndl Aspirat Bx, Trachea, Main Stem &/Or Lobar Bronchus;  Surgeon: Jerelyn Charles, MD;  Location: MAIN OR [facility];  Service: Pulmonary   ??? PR BRONCHOSCOPY,TRANSBRONCH BIOPSY Left 03/05/2020    Procedure: Bronchoscopy, Rigid/Flexible, Include Fluoro Guidance When Performed; W/Transbronchial Lung Bx, Single Lobe;  Surgeon: Jerelyn Charles, MD;  Location: MAIN OR [facility];  Service: Pulmonary   ??? PR CATH PLACE/CORON ANGIO, IMG SUPER/INTERP,W LEFT HEART VENTRICULOGRAPHY N/A 05/07/2020    Procedure: Left Heart Catheterization;  Surgeon: Lesle Reek, MD;  Location: [facility] CATH;  Service: Cardiology   ??? PR COLONOSCOPY W/BIOPSY SINGLE/MULTIPLE N/A 10/21/2016    Procedure: COLONOSCOPY, FLEXIBLE, PROXIMAL TO SPLENIC FLEXURE; WITH BIOPSY, SINGLE OR MULTIPLE;  Surgeon: Monte Fantasia, MD;  [facility] PROCEDURES [facility];  Service: Gastroenterology   ??? PR EXCIS SUPRATENT BRAIN TUMOR Right 01/15/2019    Procedure: CRANIECTOMY; EXC BRAIN TUMOR-SUPRATENTORIAL;  Surgeon: Edison Simon, MD; Location: MAIN OR [facility];  Service: Neurosurgery   ??? PR INCISE FINGER TENDON SHEATH Left 06/17/2019    Procedure: R-20 TENDON SHEATH INCISION (EG, FOR TRIGGER FINGER);  Surgeon: Daisy Lazar, MD;  Location: ASC OR [facility];  Service: Orthopedics   ??? PR MICROSURG TECHNIQUES,REQ OPER MICROSCOPE Right 01/15/2019    Procedure: MICROSURGICAL TECHNIQUES, REQUIRING USE OF OPERATING MICROSCOPE (LIST SEPARATELY IN ADDITION TO CODE FOR PRIMARY PROCEDURE);  Surgeon: Deniece Ree  Sasaki-Adams, MD;  Location: MAIN OR [facility];  Service: Neurosurgery   ??? PR STEREOTACTIC COMP ASSIST PROC,CRANIAL,INTRADURAL Right 01/15/2019    Procedure: STEREOTACTIC COMPUTER-ASSISTED (NAVIGATIONAL) PROCEDURE; CRANIAL, INTRADURAL;  Surgeon: Edison Simon, MD;  Location: MAIN OR [facility];  Service: Neurosurgery   ??? PR UPPER GI ENDOSCOPY,BIOPSY N/A 10/21/2016    Procedure: UGI ENDOSCOPY; WITH BIOPSY, SINGLE OR MULTIPLE;  Surgeon: Monte Fantasia, MD;  [facility]: [facility];  Service: Gastroenterology   ??? PR UPPER GI ENDOSCOPY,BIOPSY N/A 04/02/2018    Procedure: UGI ENDOSCOPY; WITH BIOPSY, SINGLE OR MULTIPLE;  Surgeon: Wendall Papa, MD;  [facility]URES [facility];  Service: Gastroenterology   ??? SKIN BIOPSY         Current Outpatient Medications   Medication Sig Dispense Refill   ??? albuterol HFA 90 mcg/actuation inhaler Inhale 2 puffs every six (6) hours as needed for wheezing.     ??? aluminum-magnesium hydroxide-simethicone 200-200-20 mg/5 mL Susp 80 mL, diphenhydrAMINE 12.5 mg/5 mL Liqd 200 mg, nystatin 100,000 unit/mL Susp 8,000,000 Units, distilled water Liqd 80 mL, lidocaine 2% viscous 2 % Soln 80 mL Take 5 mL by mouth every six (6) hours as needed. Swish, gargle, spit or swallow if throat issues 80 mL 2   ??? benzonatate (TESSALON) 200 MG capsule Take 1 capsule (200 mg total) by mouth Three (3) times a day as needed for cough. 30 capsule 1   ??? buprenorphine-naloxone (SUBOXONE) 2-0.5 mg sublingual film Place 1/4 film (0.5mg ) under tongue for day 1 & then for day 2- increase to 1/4 film (0.5mg ) twice a day 21 days 11 Film 0   ??? capecitabine (XELODA) 500 MG tablet Take 1 tablet (500 mg total) by mouth two (2) times a day, continuously. (Patient not taking: Reported on 08/10/2021) 60 tablet 5   ??? clobetasoL (TEMOVATE) 0.05 % ointment Apply twice a day to rash on palm until smooth/clear. 30 g 1   ??? clonazePAM (KLONOPIN) 0.5 MG tablet Take 0.5 mg daily as needed for anxiety.  Do not exceed 0.5 mg/day. 30 tablet 0   ??? diclofenac sodium (VOLTAREN) 1 % gel Apply 2 g topically four (4) times a day as needed for arthritis or pain. (Patient not taking: Reported on 08/10/2021) 150 g 2   ??? diphenoxylate-atropine (LOMOTIL) 2.5-0.025 mg per tablet Take 1 tablet by mouth 4 (four) times a day as needed for diarrhea. 30 tablet 1   ??? dorzolamide (TRUSOPT) 2 % ophthalmic solution Administer 1 drop into the left eye Three (3) times a day. 10 mL 12   ??? empagliflozin (JARDIANCE) 10 mg tablet Take 1 tablet (10 mg total) by mouth in the morning. 30 tablet 11   ??? esomeprazole (NEXIUM) 40 MG capsule Take 1 capsule (40 mg total) by mouth daily. 90 capsule 3   ??? lidocaine (LIDODERM) 5 % patch Place 1 patch on the skin daily. Apply to affected area for 12 hours only each day (then remove patch) 30 patch 3   ??? lisinopriL-hydrochlorothiazide (PRINZIDE,ZESTORETIC) 20-25 mg per tablet Take 1 tablet by mouth in the morning. 90 tablet 3   ??? MAGNESIUM ORAL Take by mouth daily. OTC (Patient not taking: Reported on 08/10/2021)     ??? naloxone (NARCAN) 4 mg nasal spray One spray in either nostril once for known/suspected opioid overdose. May repeat every 2-3 minutes in alternating nostril til EMS arrives (Patient not taking: Reported on 08/10/2021) 2 each 0   ??? ondansetron (ZOFRAN) 4 MG tablet Take 1 tablet (  4 mg total) by mouth every six (6) hours as needed for nausea. 30 tablet 1   ??? PAXLOVID CO-PACK, EUA, (PAXLOVID CO-PACK, EUA,) 300 mg (150 mg x 2)-100 mg tablet See package instructions. 30 tablet 0   ??? pregabalin (LYRICA) 200 MG capsule Take 1 capsule (200 mg total) by mouth Two (2) times a day. 60 capsule 2   ??? prochlorperazine (COMPAZINE) 10 MG tablet Take 1 tablet (10 mg total) by mouth every six (6) hours as needed for nausea. 30 tablet 6   ??? senna (SENOKOT) 8.6 mg tablet Take 3 tablets by mouth in the morning. 30 tablet 2   ??? thiamine HCl (VITAMIN B-1 ORAL) Take by mouth daily.      ??? traZODone (DESYREL) 50 MG tablet Take 1.5 tablets (75 mg total) by mouth nightly. 45 tablet 1   ??? tucatinib (TUKYSA) 150 mg tablet Take 1 tablet (150 mg total) by mouth two (2) times a day . 60 tablet 1   ??? tucatinib (TUKYSA) 50 mg tablet Take 2 tablets (100 mg total) by mouth two (2) times a day . 120 tablet 3   ??? venlafaxine (EFFEXOR-XR) 150 MG 24 hr capsule Take 2 capsules (300 mg total) by mouth in the morning. 60 capsule 2     No current facility-administered medications for this visit.       Allergies:   Allergies   Allergen Reactions   ??? Adhesive Rash   ??? Decadron [Dexamethasone] Anxiety     Mania as well   ??? Tetracycline      Other reaction(s): Other (See Comments)   ??? Oxycodone      Night terrors   ??? Tegaderm Adhesive-No Drug-Allergy Check Rash       Family History:  Cancer-related family history includes Cancer in her father, maternal grandfather, maternal uncle, maternal uncle, and paternal aunt.  She indicated that the status of her mother is unknown. She indicated that the status of her father is unknown. She indicated that the status of her brother is unknown. She indicated that the status of her maternal grandmother is unknown. She indicated that the status of her maternal grandfather is unknown. She indicated that the status of her paternal grandmother is unknown. She indicated that the status of her paternal grandfather is unknown. She indicated that the status of her maternal aunt is unknown. She indicated that the status of her paternal uncle is unknown. She indicated that the status of her neg hx is unknown. She indicated that the status of her other is unknown.           Lab Results   Component Value Date    CREATININE 0.65 08/10/2021     Lab Results   Component Value Date    ALKPHOS 214 (H) 08/10/2021    BILITOT 0.4 08/10/2021    BILIDIR 0.30 03/29/2021    PROT 7.1 08/10/2021    ALBUMIN 3.6 08/10/2021    ALT 22 08/10/2021    AST 32 08/10/2021             Burtis Junes, FNP-BC, [facility]  Outpatient Oncology Palliative Care Service  [facility]  [address] [place], [place] [address]  [phone]             The patient reports they are currently: at home. I spent 38 minutes on the phone with the patient on the date of service. I spent an additional 15 minutes on pre- and post-visit activities on the date  of service.     The patient was physically located in [place] or a state in which I am permitted to provide care. The patient and/or parent/guardian understood that s/he may incur co-pays and cost sharing, and agreed to the telemedicine visit. The visit was reasonable and appropriate under the circumstances given the patient's presentation at the time.    The patient and/or parent/guardian has been advised of the potential risks and limitations of this mode of treatment (including, but not limited to, the absence of in-person examination) and has agreed to be treated using telemedicine. The patient's/patient's family's questions regarding telemedicine have been answered.     If the visit was completed in an ambulatory setting, the patient and/or parent/guardian has also been advised to contact their provider???s office for worsening conditions, and seek emergency medical treatment and/or call 911 if the patient deems either necessary.

## 2021-09-02 DIAGNOSIS — C50811 Malignant neoplasm of overlapping sites of right female breast: Principal | ICD-10-CM

## 2021-09-02 MED ORDER — TUKYSA 150 MG TABLET
ORAL_TABLET | Freq: Two times a day (BID) | ORAL | 1 refills | 30 days
Start: 2021-09-02 — End: ?

## 2021-09-02 MED ORDER — TUKYSA 50 MG TABLET
ORAL_TABLET | Freq: Two times a day (BID) | ORAL | 3 refills | 30 days
Start: 2021-09-02 — End: ?

## 2021-09-02 MED ORDER — DICYCLOMINE 10 MG CAPSULE
ORAL_CAPSULE | Freq: Four times a day (QID) | ORAL | 0 refills | 14 days | Status: CP
Start: 2021-09-02 — End: 2021-09-16

## 2021-09-02 NOTE — Unmapped (Signed)
[facility] Refill Coordination Note    Specialty Medication(s) to be Shipped:   Hematology/Oncology: Brooke [name] 50 &150mg  and Capecitabine 500mg , directions: 1 tablet twice a day     Other medication(s) to be shipped: No additional medications requested for fill at this time     Brooke [name], DOB: Apr 30, 1956  Phone: [phone] (home)       All above HIPAA information was verified with patient's family member, sister:Brooke [name].     Was a Nurse, learning disability used for this call? No    Completed refill call assessment today to schedule patient's medication shipment from the [facility] [phone]).  All relevant notes have been reviewed.     Specialty medication(s) and dose(s) confirmed: Regimen is correct and unchanged.   Changes to medications: Brooke [name] reports no changes at this time.  Changes to insurance: No  New side effects reported not previously addressed with a pharmacist or physician: None reported  Questions for the pharmacist: No    Confirmed patient received a Conservation officer, historic buildings and a Surveyor, mining with first shipment. The patient will receive a drug information handout for each medication shipped and additional FDA Medication Guides as required.       DISEASE/MEDICATION-SPECIFIC INFORMATION        N/A    SPECIALTY MEDICATION ADHERENCE     Medication Adherence    Patient reported X missed doses in the last month: 0  Specialty Medication: Capecitabine 500mg   Patient is on additional specialty medications: Yes  Additional Specialty Medications: Brooke [name].Knuckles 50mg   Patient Reported Additional Medication X Missed Doses in the Last Month: 0  Patient is on more than two specialty medications: Yes  Specialty Medication: TUkysa 150mg   Patient Reported Additional Medication X Missed Doses in the Last Month: 0  Any gaps in refill history greater than 2 weeks in the last 3 months: no  Demonstrates understanding of importance of adherence: yes  Informant: other relative  Reliability of informant: reliable  Provider-estimated medication adherence level: good  Patient is at risk for Non-Adherence: No  Reasons for non-adherence: no problems identified              Were doses missed due to medication being on hold? No    Tukysa 50 mg: 7 days of medicine on hand   Tukysa 150 mg: 7 days of medicine on hand   Capecitabine 500 mg: 7 days of medicine on hand         REFERRAL TO PHARMACIST     Referral to the pharmacist: Not needed      [facility]     Shipping address confirmed in Epic.     Delivery Scheduled: Yes, Expected medication delivery date: 09/13.     Medication will be delivered via Next Day Courier to the prescription address in Epic WAM.    [name]   [facility] Specialty Technician

## 2021-09-03 ENCOUNTER — Ambulatory Visit: Admit: 2021-09-03 | Discharge: 2021-09-04 | Payer: MEDICARE

## 2021-09-03 DIAGNOSIS — R110 Nausea: Principal | ICD-10-CM

## 2021-09-03 DIAGNOSIS — C50919 Malignant neoplasm of unspecified site of unspecified female breast: Principal | ICD-10-CM

## 2021-09-03 DIAGNOSIS — K900 Celiac disease: Principal | ICD-10-CM

## 2021-09-03 DIAGNOSIS — G629 Polyneuropathy, unspecified: Principal | ICD-10-CM

## 2021-09-03 DIAGNOSIS — C50811 Malignant neoplasm of overlapping sites of right female breast: Principal | ICD-10-CM

## 2021-09-03 MED ORDER — HYDROCODONE 10 MG-ACETAMINOPHEN 325 MG TABLET
ORAL_TABLET | Freq: Two times a day (BID) | ORAL | 0 refills | 30 days | Status: CP | PRN
Start: 2021-09-03 — End: ?

## 2021-09-03 MED ADMIN — heparin, porcine (PF) 100 unit/mL injection 500 Units: 500 [IU] | INTRAVENOUS | @ 18:00:00 | Stop: 2021-09-03

## 2021-09-03 MED ADMIN — trastuzumab (HERCEPTIN) 340 mg in sodium chloride (NS) 0.9 % 250 mL IVPB: 6 mg/kg | INTRAVENOUS | @ 17:00:00 | Stop: 2021-09-03

## 2021-09-03 MED ADMIN — sodium chloride (NS) 0.9 % infusion: 100 mL/h | INTRAVENOUS | @ 17:00:00 | Stop: 2021-09-03

## 2021-09-03 NOTE — Unmapped (Signed)
Pharmacist Phone Follow-Up    Cancer Team  Medical Oncology: Dr. [name]  Reason for call: Oral chemotherapy management  Current treatment: Trastuzumab + tucatininb + capecitabine     Breast cancer:  Brooke [name] is a 65 yo woman with HER2+ metastatic breast cancer who started trastuzumab, tucatinib, and capecitabine on 4/18.      Brooke [name] had worsening diarrhea for an approximate 2 week period.  She also experienced abdominal cramping.  Her diarrhea has improved over the past few days and is much improved today.  Dr. [name] is recommending that the dose of tucatinib be decreased to 200 mg to reduce the episodes of diarrhea.  Brooke [name] does not want to reduce her tucatinib dose.      Plan:  1. Continue tucatinib 250 mg BID  2. Continue capecitabine 500 mg BID continuous  3. Prochlorperazine prn nausea/vomiting  4. Imodium/Lomotil prn diarrhea  5. If has another episode of diarrhea for several days will consider decreasing tucatinib dose to 200 mg BID  6. CPP telephone follow-up 09/14/21  ________________________________________________________________________     Interval History   Brooke [name] is a 65 y.o. female with metastatic breast cancer who I am calling to follow-up after reporting a bad episode of diarrhea to Burtis Junes, FNP.  She reports that she experienced this episode of multiple loose stools but notes that it has resolved.  I discussed with her that reducing her tucatinib dose may help to reduce the diarrhea.  Since her diarrhea has resolved, she is not interested in decreasing the dose.    She does continue to have intermittent nausea.  She reports that prochlorperazine is helpful and I have encouraged her to take it as needed.  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    Oral chemotherapy regimen:??   Starting 7/12:   Tucatinib 250 mg BID   ????????????????????????Capecitabine 500 mg BID, continuously (metronomic dosing to improve adherence)  ????????????????????????Trastuzumab IV q 3weeks     Starting 5/30 doses reduced to:   Tucatinib 250 mg BID (started on 6/5) --> she took 150 mg po BID (5/30-6/4) then increased on 6/5 to 250 mg after receiving 50  mg tabs from the pharmacy  ????????????????????????Capecitabine 1000 mg BID x 14 days then 7 days off  ????????????????????????Trastuzumab IV q 3weeks     Started 04/12/21:  ????????????????????????Tucatinib 300 mg po BID continuous  ????????????????????????Capecitabine 1500 mg BID x 14 days then 7 days off (stopped during first cycle d/t diarrhea)  ????????????????????????Trastuzumab IV q 3weeks  Start date: 04/12/21  Pharmacy: [facility]     Adherence: Denies missed doses    Adverse Effects:   1. Diarrhea - intermittent; currently resolved  2. Nausea - uses prochlorperazine prn    Drug interactions: Tucatinib is a strong CYP3A4 inhibitor.  It can potentially increase the concentrations of the following medications, requiring a dose reduction.  Will monitor.  1. Bupenorphine  2. Clonazepam  3. Trazodone    Patient verbalized understanding of the above information.    Breast Oncology    Breast Oncology Metrics:         Chemotherapy Dose: Dose documented     Chemotherapy Schedule: Schedule documented     NCI CTCAE: Neutropenia - Grade 1, Diarrhea - Grade 1     Comments: No interventions            I  spent 15 minutes on the phone with the patient on the date of service. I spent an additional 10 minutes on pre- and post-visit activities.     The patient was physically located in [place] or a state in which I am permitted to provide care. The patient and/or parent/guardian understood that s/he may incur co-pays and cost sharing, and agreed to the telemedicine visit. The visit was reasonable and appropriate under the circumstances given the patient's presentation at the time.    The patient and/or parent/guardian has been advised of the potential risks and limitations of this mode of treatment (including, but not limited to, the absence of in-person examination) and has agreed to be treated using telemedicine. The patient's/patient's family's questions regarding telemedicine have been answered.     If the visit was completed in an ambulatory setting, the patient and/or parent/guardian has also been advised to contact their provider???s office for worsening conditions, and seek emergency medical treatment and/or call 911 if the patient deems either necessary.      Oncology History Overview Note   Identifying Statement:  Brooke [name] is a 65 y.o. female diagnosed with a right posterior frontal lobe metastasis (breast primary) status post resection 01/15/2019 and 5/5 fractions of SBRT (2500 Gy via CyberKnife) to the resection cavity.    Treatment History:  09/05/17: S/p SRS to 7 lesions  01/15/19: S/p resection, followed by SRS 2500 cGy   03/04/19: KPS 80; MRI with postsurgical changes versus residual disease; RTC in 2 weeks w/ MRI   04/15/19: KPS NA; MRI w/ SD; no study; RTC 6 weeks w/ MRI   06/10/19: KPS 80; MRI w/ SD; RTC 4 weeks  07/15/19: KPS 80; MRI w/ SD; RTC 8 weeks  08/19/19: KPS 80; start nortriptyline for HAs; RTC 4 weeks w/ MRI  09/16/19: KPS 80; con't nortriptyline for HAs; RTC 2 mos w/ MRI  11/11/19: KPS 80; MRI w/ SD though w/ small infarct; proceed w/ CVA workup; start 81mg  ASA; encouraged smoking cessation; con't nortriptyline; RTC to discuss results  11/25/19: KPS 80; discussed CVA workup results; MRA unremarkable; con't smoking cessation efforts; con't nortriptyline for HAs; con't 81mg  ASA, start Lipitor; f/u with PCP for COPD eval; RTC 2 mos w/ MRI     Malignant neoplasm of overlapping sites of right female breast (CMS-HCC)   2017 -  Presenting Symptoms    Large open wound RT breast which began as nipple inversion. Patient states that she ignored for a long time.  Physical exam of the area of concern in the RTbreast demonstrates a large open wound in the lateral right breast. Saw PCP 01/27/16 Rx Keflex.     02/04/2016 Interval Scan(s) MMG/US: 3.1 (3.0) cm irregular spiculated dense mass in lateral Rt breast w nipple and skin retraction. 2 subcentimeter irregular masses in the RUOQ  (10', 11') (possible satellite lesions). Large Rt axillary lymph node 1.7. Lt breast clear. BIRAD 5.     02/08/2016 Biopsy    US guided Rt. Axillary LN biopsy:  Gr 3, IDC with apocrine features. ER (-), PR (31-40), HER-2 (3+). Noted to have multiple skin lesions. Poor access to breast mass due open 10-15 cm wound risk of pain and bleeding.     02/09/2016 Initial Diagnosis    Malignant neoplasm of overlapping sites of right female breast (RAF-HCC)     02/10/2016 Interval Scan(s)    CT CAP: Large, necrotic right breast mass with wide open tract to the skin, consistent with known right breast malignancy.  Numerous enlarged right axillary, subpectoral, mediastinal, bilateral hilar lymph nodes and innumerable bilateral pulmonary nodules     02/10/2016 Interval Scan(s)    NM Bone scan: No osseous metastatic disease.     04/28/2016 - 03/10/2020 Chemotherapy    OP TRASTUZUMAB (EVERY 21 DAYS)  Trastuzumab 8 mg/kg loading then 6 mg/kg every 21 days     07/13/2016 Genetics    STRATA  ??? ERBB2 copy number alteration  Estimated copy number: 40, confidence interval: 35.7 - 44.0, cellularity: 50%  Associated FDA-approved targeted therapies in breast cancer: trastuzumab, lapatinib, ado-trastuzumab emtansine, pertuzumab  ??? PIK3CA [id number]     01/03/2020 -  Cancer Staged    CT CAP 01/03/2020 which showed a new lingular opacity measuring 1.9 cm in the right lung. There was also slight increase in the right breast mass from 1.2-> 1.5 cm.  Bone scan shows no osseous metastases. Overall I considered these findings indeterminate and ppted to repeat CT CAP in 1 month     02/03/2020 -  Cancer Staged    CT chest 02/03/20 which showed that the lingular lesion has persisted and increased in size.  No mediastinal adenopathy reported.  She continues to report a nonproductive cough and mild dyspnea.  She is a chronic smoker and currently smokes on average half a pack per day.  My major concern is that this could be a second primary lung cancer as her other sites of diseases stable on current systemic therapy       02/03/2020 Endocrine/Hormone Therapy    Stop Tamoxifen, Add Fasoldex, continue Q3week Hercpetin     03/23/2020 - 02/09/2021 Chemotherapy    OP BREAST ADO-TRASTUZUMAB EMTANSINE  ado-trastuzumab emtansine 3.6 mg/kg IV on day 1, every 21 days     04/12/2021 -  Chemotherapy    Tucatinib + Capecitabine  OP TRASTUZUMAB (EVERY 21 DAYS)  trastuzumab 8 mg/kg IV LOADING, then 6 mg/kg IV MAINT, every 21 days     Brain metastases (CMS-HCC)   08/07/2017 Initial Diagnosis    Brain metastases (CMS-HCC)    Summary of Radiosurgery   Rx:7 brain met: 09/05/2017: 2,000/2,000 cGy  Sec:R Frontal: 09/05/2017: 2,000 cGy  Sec:L Post Fr: 09/05/2017: 2,000 cGy  Sec:L Ant Fro: 09/05/2017: 2,000 cGy  Sec:R Sup Oc: 09/05/2017: 2,000 cGy  Sec:L Occipita: 09/05/2017: 2,000 cGy  Sec:R Inf Occi: 09/05/2017: 2,000 cGy  Sec:L Tempor: 09/05/2017: 2,000 cGy  Rx:Lt Med Oc: 09/13/2018: 2,000/2,000 cGy  Rx:Rt Frontal: : 2,500/2,500 cGy    01/15/2019: Craniotomy and resection of right posterior frontal lobe metastasis. Followed by [facility]    03/04/19: KPS 80; MRI with postsurgical changes versus residual disease; RTC in 2 weeks w/ MRI     01/03/2020 -  Cancer Staged    CT CAP 01/03/2020 which showed a new lingular opacity measuring 1.9 cm in the right lung. There was also slight increase in the right breast mass from 1.2-> 1.5 cm.  Bone scan shows no osseous metastases. Overall I considered these findings indeterminate and ppted to repeat CT CAP in 1 month     02/03/2020 -  Cancer Staged    CT chest 02/03/20 which showed that the lingular lesion has persisted and increased in size.  No mediastinal adenopathy reported.  She continues to report a nonproductive cough and mild dyspnea.  She is a chronic smoker and currently smokes on average half a pack per day.  My major concern is that this could be a second primary lung cancer as her other sites of  diseases stable on current systemic therapy       02/03/2020 Endocrine/Hormone Therapy    Stop Tamoxifen, Add Fasoldex, continue Q3week Hercpetin     Metastatic breast cancer (CMS-HCC)   01/25/2019 Initial Diagnosis    Metastatic breast cancer (CMS-HCC)     03/23/2020 - 02/09/2021 Chemotherapy    OP BREAST ADO-TRASTUZUMAB EMTANSINE  ado-trastuzumab emtansine 3.6 mg/kg IV on day 1, every 21 days     04/12/2021 -  Chemotherapy    Tucatinib + Capecitabine  OP TRASTUZUMAB (EVERY 21 DAYS)  trastuzumab 8 mg/kg IV LOADING, then 6 mg/kg IV MAINT, every 21 days          Pertinent Labs:   No visits with results within 1 Day(s) from this visit.   Latest known visit with results is:   Clinical Support on 08/10/2021   Component Date Value Ref Range Status   ??? Sodium 08/10/2021 138  135 - 145 mmol/L Final   ??? Potassium 08/10/2021 3.5  3.4 - 4.8 mmol/L Final   ??? Chloride 08/10/2021 109 (A) 98 - 107 mmol/L Final   ??? CO2 08/10/2021 26.5  20.0 - 31.0 mmol/L Final   ??? Anion Gap 08/10/2021 3 (A) 5 - 14 mmol/L Final   ??? BUN 08/10/2021 7 (A) 9 - 23 mg/dL Final   ??? Creatinine 08/10/2021 0.65  0.60 - 0.80 mg/dL Final   ??? BUN/Creatinine Ratio 08/10/2021 11   Final   ??? eGFR CKD-EPI (2021) Female 08/10/2021 >90  >=60 mL/min/1.64m2 Final    eGFR calculated with CKD-EPI 2021 equation in accordance with [employer] and [employer] of Nephrology Task Force recommendations.   ??? Glucose 08/10/2021 92  70 - 179 mg/dL Final   ??? Calcium 16/09/9603 8.6 (A) 8.7 - 10.4 mg/dL Final   ??? Albumin 54/08/8118 3.6  3.4 - 5.0 g/dL Final   ??? Total Protein 08/10/2021 7.1  5.7 - 8.2 g/dL Final   ??? Total Bilirubin 08/10/2021 0.4  0.3 - 1.2 mg/dL Final   ??? AST 14/78/2956 32  <=34 U/L Final   ??? ALT 08/10/2021 22  10 - 49 U/L Final   ??? Alkaline Phosphatase 08/10/2021 214 (A) 46 - 116 U/L Final   ??? WBC 08/10/2021 6.0  3.6 - 11.2 10*9/L Final   ??? RBC 08/10/2021 3.64 (A) 3.95 - 5.13 10*12/L Final   ??? HGB 08/10/2021 12.6  11.3 - 14.9 g/dL Final   ??? HCT 21/30/8657 37.4  34.0 - 44.0 % Final   ??? MCV 08/10/2021 102.7 (A) 77.6 - 95.7 fL Final   ??? MCH 08/10/2021 34.6 (A) 25.9 - 32.4 pg Final   ??? MCHC 08/10/2021 33.7  32.0 - 36.0 g/dL Final   ??? RDW 84/69/6295 19.3 (A) 12.2 - 15.2 % Final   ??? MPV 08/10/2021 7.1  6.8 - 10.7 fL Final   ??? Platelet 08/10/2021 148 (A) 150 - 450 10*9/L Final   ??? nRBC 08/10/2021 0  <=4 /100 WBCs Final   ??? Neutrophils % 08/10/2021 56.8  % Final   ??? Lymphocytes % 08/10/2021 34.5  % Final   ??? Monocytes % 08/10/2021 7.8  % Final   ??? Eosinophils % 08/10/2021 0.7  % Final   ??? Basophils % 08/10/2021 0.2  % Final   ??? Absolute Neutrophils 08/10/2021 3.4  1.8 - 7.8 10*9/L Final   ??? Absolute Lymphocytes 08/10/2021 2.1  1.1 - 3.6 10*9/L Final   ??? Absolute Monocytes 08/10/2021 0.5  0.3 - 0.8 10*9/L Final   ???  Absolute Eosinophils 08/10/2021 0.0  0.0 - 0.5 10*9/L Final   ??? Absolute Basophils 08/10/2021 0.0  0.0 - 0.1 10*9/L Final   ??? Anisocytosis 08/10/2021 Moderate (A) Not Present Final       Current medications:   Current Outpatient Medications   Medication Sig Dispense Refill   ??? albuterol HFA 90 mcg/actuation inhaler Inhale 2 puffs every six (6) hours as needed for wheezing.     ??? aluminum-magnesium hydroxide-simethicone 200-200-20 mg/5 mL Susp 80 mL, diphenhydrAMINE 12.5 mg/5 mL Liqd 200 mg, nystatin 100,000 unit/mL Susp 8,000,000 Units, distilled water Liqd 80 mL, lidocaine 2% viscous 2 % Soln 80 mL Take 5 mL by mouth every six (6) hours as needed. Swish, gargle, spit or swallow if throat issues 80 mL 2   ??? benzonatate (TESSALON) 200 MG capsule Take 1 capsule (200 mg total) by mouth Three (3) times a day as needed for cough. 30 capsule 1   ??? buprenorphine-naloxone (SUBOXONE) 2-0.5 mg sublingual film Place 1/4 film (0.5mg ) under tongue for day 1 & then for day 2- increase to 1/4 film (0.5mg ) twice a day 21 days 11 Film 0   ??? capecitabine (XELODA) 500 MG tablet Take 1 tablet (500 mg total) by mouth two (2) times a day, continuously. (Patient not taking: Reported on 08/10/2021) 60 tablet 5   ??? clobetasoL (TEMOVATE) 0.05 % ointment Apply twice a day to rash on palm until smooth/clear. 30 g 1   ??? clonazePAM (KLONOPIN) 0.5 MG tablet Take 0.5 mg daily as needed for anxiety.  Do not exceed 0.5 mg/day. 30 tablet 0   ??? diclofenac sodium (VOLTAREN) 1 % gel Apply 2 g topically four (4) times a day as needed for arthritis or pain. (Patient not taking: Reported on 08/10/2021) 150 g 2   ??? dicyclomine (BENTYL) 10 mg capsule Take 1 capsule (10 mg total) by mouth four (4) times a day for 14 days. 56 capsule 0   ??? diphenoxylate-atropine (LOMOTIL) 2.5-0.025 mg per tablet Take 1 tablet by mouth 4 (four) times a day as needed for diarrhea. 30 tablet 1   ??? dorzolamide (TRUSOPT) 2 % ophthalmic solution Administer 1 drop into the left eye Three (3) times a day. 10 mL 12   ??? empagliflozin (JARDIANCE) 10 mg tablet Take 1 tablet (10 mg total) by mouth in the morning. 30 tablet 11   ??? esomeprazole (NEXIUM) 40 MG capsule Take 1 capsule (40 mg total) by mouth daily. 90 capsule 3   ??? lidocaine (LIDODERM) 5 % patch Place 1 patch on the skin daily. Apply to affected area for 12 hours only each day (then remove patch) 30 patch 3   ??? lisinopriL-hydrochlorothiazide (PRINZIDE,ZESTORETIC) 20-25 mg per tablet Take 1 tablet by mouth in the morning. 90 tablet 3   ??? MAGNESIUM ORAL Take by mouth daily. OTC (Patient not taking: Reported on 08/10/2021)     ??? naloxone (NARCAN) 4 mg nasal spray One spray in either nostril once for known/suspected opioid overdose. May repeat every 2-3 minutes in alternating nostril til EMS arrives (Patient not taking: Reported on 08/10/2021) 2 each 0   ??? ondansetron (ZOFRAN) 4 MG tablet Take 1 tablet (4 mg total) by mouth every six (6) hours as needed for nausea. 30 tablet 1   ??? PAXLOVID CO-PACK, EUA, (PAXLOVID CO-PACK, EUA,) 300 mg (150 mg x 2)-100 mg tablet See package instructions. 30 tablet 0   ??? pregabalin (LYRICA) 200 MG capsule Take 1 capsule (200 mg total) by mouth Two (  2) times a day. 60 capsule 2   ??? prochlorperazine (COMPAZINE) 10 MG tablet Take 1 tablet (10 mg total) by mouth every six (6) hours as needed for nausea. 30 tablet 6   ??? senna (SENOKOT) 8.6 mg tablet Take 3 tablets by mouth in the morning. 30 tablet 2   ??? thiamine HCl (VITAMIN B-1 ORAL) Take by mouth daily.      ??? traZODone (DESYREL) 50 MG tablet Take 1.5 tablets (75 mg total) by mouth nightly. 45 tablet 1   ??? tucatinib (TUKYSA) 150 mg tablet Take 1 tablet (150 mg total) by mouth two (2) times a day . 60 tablet 1   ??? tucatinib (TUKYSA) 50 mg tablet Take 2 tablets (100 mg total) by mouth two (2) times a day . 120 tablet 3   ??? venlafaxine (EFFEXOR-XR) 150 MG 24 hr capsule Take 2 capsules (300 mg total) by mouth in the morning. 60 capsule 2     No current facility-administered medications for this visit.     Facility-Administered Medications Ordered in Other Visits   Medication Dose Route Frequency Provider Last Rate Last Admin   ??? OKAY TO SEND MEDICATION/CHEMOTHERAPY TO UNIT   Other Once Olivia Mackie, MD       ??? sodium chloride (NS) 0.9 % infusion  100 mL/hr Intravenous Continuous Olivia Mackie, MD 100 mL/hr at 09/03/21 1239 100 mL/hr at 09/03/21 1239   ??? trastuzumab (HERCEPTIN) 340 mg in sodium chloride (NS) 0.9 % 250 mL IVPB  6 mg/kg (Treatment Plan Recorded) Intravenous Once Olivia Mackie, MD

## 2021-09-03 NOTE — Unmapped (Signed)
No visits with results within 1 Day(s) from this visit.   Latest known visit with results is:   Clinical Support on 08/10/2021   Component Date Value Ref Range Status    Sodium 08/10/2021 138  135 - 145 mmol/L Final    Potassium 08/10/2021 3.5  3.4 - 4.8 mmol/L Final    Chloride 08/10/2021 109 (A) 98 - 107 mmol/L Final    CO2 08/10/2021 26.5  20.0 - 31.0 mmol/L Final    Anion Gap 08/10/2021 3 (A) 5 - 14 mmol/L Final    BUN 08/10/2021 7 (A) 9 - 23 mg/dL Final    Creatinine 16/09/9603 0.65  0.60 - 0.80 mg/dL Final    BUN/Creatinine Ratio 08/10/2021 11   Final    eGFR CKD-EPI (2021) Female 08/10/2021 >90  >=60 mL/min/1.67m2 Final    eGFR calculated with CKD-EPI 2021 equation in accordance with [employer] and [employer] of Nephrology Task Force recommendations.    Glucose 08/10/2021 92  70 - 179 mg/dL Final    Calcium 54/08/8118 8.6 (A) 8.7 - 10.4 mg/dL Final    Albumin 14/78/2956 3.6  3.4 - 5.0 g/dL Final    Total Protein 08/10/2021 7.1  5.7 - 8.2 g/dL Final    Total Bilirubin 08/10/2021 0.4  0.3 - 1.2 mg/dL Final    AST 21/30/8657 32  <=34 U/L Final    ALT 08/10/2021 22  10 - 49 U/L Final    Alkaline Phosphatase 08/10/2021 214 (A) 46 - 116 U/L Final    WBC 08/10/2021 6.0  3.6 - 11.2 10*9/L Final    RBC 08/10/2021 3.64 (A) 3.95 - 5.13 10*12/L Final    HGB 08/10/2021 12.6  11.3 - 14.9 g/dL Final    HCT 84/69/6295 37.4  34.0 - 44.0 % Final    MCV 08/10/2021 102.7 (A) 77.6 - 95.7 fL Final    MCH 08/10/2021 34.6 (A) 25.9 - 32.4 pg Final    MCHC 08/10/2021 33.7  32.0 - 36.0 g/dL Final    RDW 28/41/3244 19.3 (A) 12.2 - 15.2 % Final    MPV 08/10/2021 7.1  6.8 - 10.7 fL Final    Platelet 08/10/2021 148 (A) 150 - 450 10*9/L Final    nRBC 08/10/2021 0  <=4 /100 WBCs Final    Neutrophils % 08/10/2021 56.8  % Final    Lymphocytes % 08/10/2021 34.5  % Final    Monocytes % 08/10/2021 7.8  % Final    Eosinophils % 08/10/2021 0.7  % Final    Basophils % 08/10/2021 0.2  % Final    Absolute Neutrophils 08/10/2021 3.4  1.8 - 7.8 10*9/L Final    Absolute Lymphocytes 08/10/2021 2.1  1.1 - 3.6 10*9/L Final    Absolute Monocytes 08/10/2021 0.5  0.3 - 0.8 10*9/L Final    Absolute Eosinophils 08/10/2021 0.0  0.0 - 0.5 10*9/L Final    Absolute Basophils 08/10/2021 0.0  0.0 - 0.1 10*9/L Final    Anisocytosis 08/10/2021 Moderate (A) Not Present Final

## 2021-09-03 NOTE — Unmapped (Signed)
Patient arrived to infusion chair accompanied by care giver and verbalizing no complaints. Port accessed without difficulty with blood return noted and flushes well prior to starting infusion. No labs needed today and medication released to pharmacy for mixing. NS started and awaiting delivery of medication from pharmacy. Patient completed today's infusion without any difficulty. AVS printed and given to patient. Port flushed, blood return noted and patient left infusion clinic ambulatory verbalizing no complaints and accompanied by family member.

## 2021-09-04 NOTE — Unmapped (Signed)
Brooke [name] sharing that she had visual hallucinations and the sensation of feeling like she was drunk with the Suboxone.  She could appreciate that she could hear herself talking in her sleep last night as well.  It did help with her neuropathic and back pain.  Also produce nausea but that its been controlled with the Compazine.  Connected with her team and okay to take the hydrocodone and Tylenol provided she does not consume alcohol.    Shared this with Brooke [name] and hydrocodone/Tylenol 10/325 mg tablets twice daily as needed pain was sent to pharmacy.  Will add Suboxone as an allergy.    Brooke Junes, FNP-BC, [facility]  Outpatient Oncology Palliative Care Service  [facility]  [address] [place], [place] [address]  [phone]

## 2021-09-06 DIAGNOSIS — C50811 Malignant neoplasm of overlapping sites of right female breast: Principal | ICD-10-CM

## 2021-09-06 DIAGNOSIS — F5101 Primary insomnia: Principal | ICD-10-CM

## 2021-09-06 MED ORDER — TUCATINIB 150 MG TABLET
ORAL_TABLET | Freq: Two times a day (BID) | ORAL | 1 refills | 30 days | Status: CP
Start: 2021-09-06 — End: ?
  Filled 2021-09-08: qty 60, 30d supply, fill #0

## 2021-09-06 MED ORDER — TRAZODONE 50 MG TABLET
ORAL_TABLET | Freq: Every evening | ORAL | 1 refills | 30 days
Start: 2021-09-06 — End: ?

## 2021-09-06 MED ORDER — TUCATINIB 50 MG TABLET
ORAL_TABLET | Freq: Two times a day (BID) | ORAL | 3 refills | 30 days | Status: CP
Start: 2021-09-06 — End: ?

## 2021-09-06 NOTE — Unmapped (Signed)
Brooke [name] 's entire shipment will be delayed as a result of the medication is too soon to refill until 09/08/21.     I have reached out to the patient  at [phone] and communicated the delivery change. We will reschedule the medication for the delivery date that the patient agreed upon.  We have confirmed the delivery date as 09/08/21, via same day courier.

## 2021-09-07 DIAGNOSIS — E876 Hypokalemia: Principal | ICD-10-CM

## 2021-09-07 MED ORDER — POTASSIUM CHLORIDE ER 10 MEQ TABLET,EXTENDED RELEASE
ORAL_TABLET | Freq: Two times a day (BID) | ORAL | 0 refills | 90 days | Status: CP
Start: 2021-09-07 — End: 2021-12-06

## 2021-09-07 MED ORDER — TRAZODONE 50 MG TABLET
ORAL_TABLET | Freq: Every evening | ORAL | 1 refills | 30 days | Status: CP
Start: 2021-09-07 — End: ?

## 2021-09-07 NOTE — Unmapped (Signed)
LM on pt phone requesting return call in reference to report of new L arm pain

## 2021-09-07 NOTE — Unmapped (Signed)
error 

## 2021-09-08 MED FILL — CAPECITABINE 500 MG TABLET: ORAL | 30 days supply | Qty: 60 | Fill #2

## 2021-09-08 MED FILL — TUKYSA 50 MG TABLET: ORAL | 30 days supply | Qty: 120 | Fill #0

## 2021-09-10 ENCOUNTER — Other Ambulatory Visit: Admit: 2021-09-10 | Discharge: 2021-09-11 | Payer: MEDICARE

## 2021-09-10 DIAGNOSIS — E876 Hypokalemia: Principal | ICD-10-CM

## 2021-09-10 DIAGNOSIS — Z171 Estrogen receptor negative status [ER-]: Principal | ICD-10-CM

## 2021-09-10 DIAGNOSIS — C50811 Malignant neoplasm of overlapping sites of right female breast: Principal | ICD-10-CM

## 2021-09-10 DIAGNOSIS — C50919 Malignant neoplasm of unspecified site of unspecified female breast: Principal | ICD-10-CM

## 2021-09-10 LAB — CBC W/ AUTO DIFF
BASOPHILS ABSOLUTE COUNT: 0 10*9/L (ref 0.0–0.1)
BASOPHILS RELATIVE PERCENT: 0.3 %
EOSINOPHILS ABSOLUTE COUNT: 0 10*9/L (ref 0.0–0.5)
EOSINOPHILS RELATIVE PERCENT: 0.4 %
HEMATOCRIT: 40 % (ref 34.0–44.0)
HEMOGLOBIN: 13.8 g/dL (ref 11.3–14.9)
LYMPHOCYTES ABSOLUTE COUNT: 1.4 10*9/L (ref 1.1–3.6)
LYMPHOCYTES RELATIVE PERCENT: 17.9 %
MEAN CORPUSCULAR HEMOGLOBIN CONC: 34.5 g/dL (ref 32.0–36.0)
MEAN CORPUSCULAR HEMOGLOBIN: 35.6 pg — ABNORMAL HIGH (ref 25.9–32.4)
MEAN CORPUSCULAR VOLUME: 103.3 fL — ABNORMAL HIGH (ref 77.6–95.7)
MEAN PLATELET VOLUME: 7.2 fL (ref 6.8–10.7)
MONOCYTES ABSOLUTE COUNT: 0.7 10*9/L (ref 0.3–0.8)
MONOCYTES RELATIVE PERCENT: 8.8 %
NEUTROPHILS ABSOLUTE COUNT: 5.9 10*9/L (ref 1.8–7.8)
NEUTROPHILS RELATIVE PERCENT: 72.6 %
NUCLEATED RED BLOOD CELLS: 0 /100{WBCs} (ref <=4)
PLATELET COUNT: 190 10*9/L (ref 150–450)
RED BLOOD CELL COUNT: 3.88 10*12/L — ABNORMAL LOW (ref 3.95–5.13)
RED CELL DISTRIBUTION WIDTH: 18.6 % — ABNORMAL HIGH (ref 12.2–15.2)
WBC ADJUSTED: 8.1 10*9/L (ref 3.6–11.2)

## 2021-09-10 LAB — COMPREHENSIVE METABOLIC PANEL
ALBUMIN: 3.6 g/dL (ref 3.4–5.0)
ALKALINE PHOSPHATASE: 214 U/L — ABNORMAL HIGH (ref 46–116)
ALT (SGPT): 15 U/L (ref 10–49)
ANION GAP: 9 mmol/L (ref 5–14)
AST (SGOT): 27 U/L (ref <=34)
BILIRUBIN TOTAL: 0.4 mg/dL (ref 0.3–1.2)
BLOOD UREA NITROGEN: <5 mg/dL — ABNORMAL LOW (ref 9–23)
CALCIUM: 8.9 mg/dL (ref 8.7–10.4)
CHLORIDE: 107 mmol/L (ref 98–107)
CO2: 25.5 mmol/L (ref 20.0–31.0)
CREATININE: 0.65 mg/dL
EGFR CKD-EPI (2021) FEMALE: >90 mL/min/{1.73_m2} (ref >=60)
GLUCOSE RANDOM: 97 mg/dL (ref 70–179)
POTASSIUM: 3.3 mmol/L — ABNORMAL LOW (ref 3.4–4.8)
PROTEIN TOTAL: 7.1 g/dL (ref 5.7–8.2)
SODIUM: 141 mmol/L (ref 135–145)

## 2021-09-10 NOTE — Unmapped (Signed)
Addended by: Kerrin Champagne on: 09/09/2021 05:57 PM     Modules accepted: Orders

## 2021-09-10 NOTE — Unmapped (Signed)
Labs drawn via [place].    Port flushed and brisk blood return     Pt tolerated well.

## 2021-09-14 ENCOUNTER — Institutional Professional Consult (permissible substitution): Admit: 2021-09-14 | Discharge: 2021-09-15 | Payer: MEDICARE | Attending: Pharmacist | Primary: Pharmacist

## 2021-09-14 DIAGNOSIS — Z171 Estrogen receptor negative status [ER-]: Principal | ICD-10-CM

## 2021-09-14 DIAGNOSIS — C50811 Malignant neoplasm of overlapping sites of right female breast: Principal | ICD-10-CM

## 2021-09-14 NOTE — Unmapped (Signed)
Pharmacist Phone Follow-Up    Cancer Team  Medical Oncology: Dr. [name]  Reason for call: Oral chemotherapy management  Current treatment: Trastuzumab + tucatininb + capecitabine     Breast cancer:  Brooke [name] is a 65 yo woman with HER2+ metastatic breast cancer who started trastuzumab, tucatinib, and capecitabine on 4/18.      Brooke [name] is having worsening back pain today.  She is being followed by palliative care.  She is tolerating capecitabine and tucatinib well with no diarrhea or N/V.      Plan:  1. Continue tucatinib 250 mg BID  2. Continue capecitabine 500 mg BID continuous  3. Prochlorperazine prn nausea/vomiting  4. Imodium/Lomotil prn diarrhea  5. Next trastuzumab infusion and labs on 9/30  ________________________________________________________________________     Interval History   Brooke [name] is a 65 y.o. female with metastatic breast cancer who I am calling to follow-up on tolerance to tucatinib and capecitabine.  She reports tolerating these medications well.  She is now having regular stools and has not had any episodes of diarrhea.  She also notes that she is not having any nausea or vomiting at this time.  She has not noticed any new adverse effects.  She is having bad back pain today and finds that hydrocodone/APAP is not helping.  I encouraged her to reach out to palliative care since they are managing her pain.    Oral chemotherapy regimen:??   Starting 7/12:   Tucatinib 250 mg BID   ????????????????????????Capecitabine 500 mg BID, continuously (metronomic dosing to improve adherence)  ????????????????????????Trastuzumab IV q 3weeks     Starting 5/30 doses reduced to:   Tucatinib 250 mg BID (started on 6/5) --> she took 150 mg po BID (5/30-6/4) then increased on 6/5 to 250 mg after receiving 50  mg tabs from the pharmacy  ????????????????????????Capecitabine 1000 mg BID x 14 days then 7 days off  ????????????????????????Trastuzumab IV q 3weeks     Started 04/12/21:  ????????????????????????Tucatinib 300 mg po BID continuous  ????????????????????????Capecitabine 1500 mg BID x 14 days then 7 days off (stopped during first cycle d/t diarrhea)  ????????????????????????Trastuzumab IV q 3weeks  Start date: 04/12/21  Pharmacy: [facility]     Adherence: Denies missed doses    Adverse Effects:   1. Diarrhea - currently resolved, uses Imodium/Lomotil prn  2. Nausea - currently resolved, uses prochlorperazine prn    Drug interactions: Tucatinib is a strong CYP3A4 inhibitor.  It can potentially increase the concentrations of the following medications, requiring a dose reduction.  Will monitor.  1. Bupenorphine  2. Clonazepam  3. Trazodone    Patient verbalized understanding of the above information.    Breast Oncology    Breast Oncology Metrics:         Chemotherapy Dose: Dose documented     Chemotherapy Schedule: Schedule documented     NCI CTCAE: Diarrhea - None/Grade 0, Nausea/Vomiting - None/Grade 0     Comments: No interventions for oral chemo needed;        I spent 15 minutes on the phone with the patient on the date of service. I spent an additional 10 minutes on pre- and post-visit activities.     The patient was physically located in [place] or a state in which I am permitted to provide care. The patient and/or parent/guardian understood that s/he may incur co-pays and cost sharing, and agreed to the telemedicine visit. The visit was reasonable and appropriate under the circumstances given the patient's  presentation at the time.    The patient and/or parent/guardian has been advised of the potential risks and limitations of this mode of treatment (including, but not limited to, the absence of in-person examination) and has agreed to be treated using telemedicine. The patient's/patient's family's questions regarding telemedicine have been answered.     If the visit was completed in an ambulatory setting, the patient and/or parent/guardian has also been advised to contact their provider???s office for worsening conditions, and seek emergency medical treatment and/or call 911 if the patient deems either necessary.        Oncology History Overview Note   Identifying Statement:  Brooke [name] is a 65 y.o. female diagnosed with a right posterior frontal lobe metastasis (breast primary) status post resection 01/15/2019 and 5/5 fractions of SBRT (2500 Gy via CyberKnife) to the resection cavity.    Treatment History:  09/05/17: S/p SRS to 7 lesions  01/15/19: S/p resection, followed by SRS 2500 cGy   03/04/19: KPS 80; MRI with postsurgical changes versus residual disease; RTC in 2 weeks w/ MRI   04/15/19: KPS NA; MRI w/ SD; no study; RTC 6 weeks w/ MRI   06/10/19: KPS 80; MRI w/ SD; RTC 4 weeks  07/15/19: KPS 80; MRI w/ SD; RTC 8 weeks  08/19/19: KPS 80; start nortriptyline for HAs; RTC 4 weeks w/ MRI  09/16/19: KPS 80; con't nortriptyline for HAs; RTC 2 mos w/ MRI  11/11/19: KPS 80; MRI w/ SD though w/ small infarct; proceed w/ CVA workup; start 81mg  ASA; encouraged smoking cessation; con't nortriptyline; RTC to discuss results  11/25/19: KPS 80; discussed CVA workup results; MRA unremarkable; con't smoking cessation efforts; con't nortriptyline for HAs; con't 81mg  ASA, start Lipitor; f/u with PCP for COPD eval; RTC 2 mos w/ MRI     Malignant neoplasm of overlapping sites of right female breast (CMS-HCC)   2017 -  Presenting Symptoms    Large open wound RT breast which began as nipple inversion. Patient states that she ignored for a long time.  Physical exam of the area of concern in the RTbreast demonstrates a large open wound in the lateral right breast. Saw PCP 01/27/16 Rx Keflex.     02/04/2016 Interval Scan(s)    MMG/US: 3.1 (3.0) cm irregular spiculated dense mass in lateral Rt breast w nipple and skin retraction. 2 subcentimeter irregular masses in the RUOQ  (10', 11') (possible satellite lesions). Large Rt axillary lymph node 1.7. Lt breast clear. BIRAD 5.     02/08/2016 Biopsy    US guided Rt. Axillary LN biopsy:  Gr 3, IDC with apocrine features. ER (-), PR (31-40), HER-2 (3+). Noted to have multiple skin lesions. Poor access to breast mass due open 10-15 cm wound risk of pain and bleeding.     02/09/2016 Initial Diagnosis    Malignant neoplasm of overlapping sites of right female breast (RAF-HCC)     02/10/2016 Interval Scan(s)    CT CAP: Large, necrotic right breast mass with wide open tract to the skin, consistent with known right breast malignancy.  Numerous enlarged right axillary, subpectoral, mediastinal, bilateral hilar lymph nodes and innumerable bilateral pulmonary nodules     02/10/2016 Interval Scan(s)    NM Bone scan: No osseous metastatic disease.     04/28/2016 - 03/10/2020 Chemotherapy    OP TRASTUZUMAB (EVERY 21 DAYS)  Trastuzumab 8 mg/kg loading then 6 mg/kg every 21 days     07/13/2016 Genetics    STRATA  ??? ERBB2 copy  number alteration  Estimated copy number: 40, confidence interval: 35.7 - 44.0, cellularity: 50%  Associated FDA-approved targeted therapies in breast cancer: trastuzumab, lapatinib, ado-trastuzumab emtansine, pertuzumab  ??? PIK3CA [id number]     01/03/2020 -  Cancer Staged    CT CAP 01/03/2020 which showed a new lingular opacity measuring 1.9 cm in the right lung. There was also slight increase in the right breast mass from 1.2-> 1.5 cm.  Bone scan shows no osseous metastases. Overall I considered these findings indeterminate and ppted to repeat CT CAP in 1 month     02/03/2020 -  Cancer Staged    CT chest 02/03/20 which showed that the lingular lesion has persisted and increased in size.  No mediastinal adenopathy reported.  She continues to report a nonproductive cough and mild dyspnea.  She is a chronic smoker and currently smokes on average half a pack per day.  My major concern is that this could be a second primary lung cancer as her other sites of diseases stable on current systemic therapy       02/03/2020 Endocrine/Hormone Therapy    Stop Tamoxifen, Add Fasoldex, continue Q3week Hercpetin     03/23/2020 - 02/09/2021 Chemotherapy    OP BREAST ADO-TRASTUZUMAB EMTANSINE  ado-trastuzumab emtansine 3.6 mg/kg IV on day 1, every 21 days     04/12/2021 -  Chemotherapy    Tucatinib + Capecitabine  OP TRASTUZUMAB (EVERY 21 DAYS)  trastuzumab 8 mg/kg IV LOADING, then 6 mg/kg IV MAINT, every 21 days     Brain metastases (CMS-HCC)   08/07/2017 Initial Diagnosis    Brain metastases (CMS-HCC)    Summary of Radiosurgery   Rx:7 brain met: 09/05/2017: 2,000/2,000 cGy  Sec:R Frontal: 09/05/2017: 2,000 cGy  Sec:L Post Fr: 09/05/2017: 2,000 cGy  Sec:L Ant Fro: 09/05/2017: 2,000 cGy  Sec:R Sup Oc: 09/05/2017: 2,000 cGy  Sec:L Occipita: 09/05/2017: 2,000 cGy  Sec:R Inf Occi: 09/05/2017: 2,000 cGy  Sec:L Tempor: 09/05/2017: 2,000 cGy  Rx:Lt Med Oc: 09/13/2018: 2,000/2,000 cGy  Rx:Rt Frontal: : 2,500/2,500 cGy    01/15/2019: Craniotomy and resection of right posterior frontal lobe metastasis. Followed by [facility]    03/04/19: KPS 80; MRI with postsurgical changes versus residual disease; RTC in 2 weeks w/ MRI     01/03/2020 -  Cancer Staged    CT CAP 01/03/2020 which showed a new lingular opacity measuring 1.9 cm in the right lung. There was also slight increase in the right breast mass from 1.2-> 1.5 cm.  Bone scan shows no osseous metastases. Overall I considered these findings indeterminate and ppted to repeat CT CAP in 1 month     02/03/2020 -  Cancer Staged    CT chest 02/03/20 which showed that the lingular lesion has persisted and increased in size.  No mediastinal adenopathy reported.  She continues to report a nonproductive cough and mild dyspnea.  She is a chronic smoker and currently smokes on average half a pack per day.  My major concern is that this could be a second primary lung cancer as her other sites of diseases stable on current systemic therapy       02/03/2020 Endocrine/Hormone Therapy    Stop Tamoxifen, Add Fasoldex, continue Q3week Hercpetin     Metastatic breast cancer (CMS-HCC)   01/25/2019 Initial Diagnosis    Metastatic breast cancer (CMS-HCC)     03/23/2020 - 02/09/2021 Chemotherapy    OP BREAST ADO-TRASTUZUMAB EMTANSINE  ado-trastuzumab emtansine 3.6 mg/kg IV on day 1, every 21 days  04/12/2021 -  Chemotherapy    Tucatinib + Capecitabine  OP TRASTUZUMAB (EVERY 21 DAYS)  trastuzumab 8 mg/kg IV LOADING, then 6 mg/kg IV MAINT, every 21 days          Pertinent Labs:   No visits with results within 1 Day(s) from this visit.   Latest known visit with results is:   Lab on 09/10/2021   Component Date Value Ref Range Status   ??? Sodium 09/10/2021 141  135 - 145 mmol/L Final   ??? Potassium 09/10/2021 3.3 (A) 3.4 - 4.8 mmol/L Final   ??? Chloride 09/10/2021 107  98 - 107 mmol/L Final   ??? CO2 09/10/2021 25.5  20.0 - 31.0 mmol/L Final   ??? Anion Gap 09/10/2021 9  5 - 14 mmol/L Final   ??? BUN 09/10/2021 <5 (A) 9 - 23 mg/dL Final   ??? Creatinine 09/10/2021 0.65  0.60 - 0.80 mg/dL Final   ??? eGFR CKD-EPI (2021) Female 09/10/2021 >90  >=60 mL/min/1.81m2 Final    eGFR calculated with CKD-EPI 2021 equation in accordance with [employer] and [employer] of Nephrology Task Force recommendations.   ??? Glucose 09/10/2021 97  70 - 179 mg/dL Final   ??? Calcium 16/09/9603 8.9  8.7 - 10.4 mg/dL Final   ??? Albumin 54/08/8118 3.6  3.4 - 5.0 g/dL Final   ??? Total Protein 09/10/2021 7.1  5.7 - 8.2 g/dL Final   ??? Total Bilirubin 09/10/2021 0.4  0.3 - 1.2 mg/dL Final   ??? AST 14/78/2956 27  <=34 U/L Final   ??? ALT 09/10/2021 15  10 - 49 U/L Final   ??? Alkaline Phosphatase 09/10/2021 214 (A) 46 - 116 U/L Final   ??? WBC 09/10/2021 8.1  3.6 - 11.2 10*9/L Final   ??? RBC 09/10/2021 3.88 (A) 3.95 - 5.13 10*12/L Final   ??? HGB 09/10/2021 13.8  11.3 - 14.9 g/dL Final   ??? HCT 21/30/8657 40.0  34.0 - 44.0 % Final   ??? MCV 09/10/2021 103.3 (A) 77.6 - 95.7 fL Final   ??? MCH 09/10/2021 35.6 (A) 25.9 - 32.4 pg Final   ??? MCHC 09/10/2021 34.5  32.0 - 36.0 g/dL Final   ??? RDW 84/69/6295 18.6 (A) 12.2 - 15.2 % Final   ??? MPV 09/10/2021 7.2  6.8 - 10.7 fL Final   ??? Platelet 09/10/2021 190  150 - 450 10*9/L Final   ??? nRBC 09/10/2021 0  <=4 /100 WBCs Final   ??? Neutrophils % 09/10/2021 72.6  % Final   ??? Lymphocytes % 09/10/2021 17.9  % Final   ??? Monocytes % 09/10/2021 8.8  % Final   ??? Eosinophils % 09/10/2021 0.4  % Final   ??? Basophils % 09/10/2021 0.3  % Final   ??? Absolute Neutrophils 09/10/2021 5.9  1.8 - 7.8 10*9/L Final   ??? Absolute Lymphocytes 09/10/2021 1.4  1.1 - 3.6 10*9/L Final   ??? Absolute Monocytes 09/10/2021 0.7  0.3 - 0.8 10*9/L Final   ??? Absolute Eosinophils 09/10/2021 0.0  0.0 - 0.5 10*9/L Final   ??? Absolute Basophils 09/10/2021 0.0  0.0 - 0.1 10*9/L Final   ??? Anisocytosis 09/10/2021 Moderate (A) Not Present Final       Current medications:     Current Outpatient Medications   Medication Sig Dispense Refill   ??? albuterol HFA 90 mcg/actuation inhaler Inhale 2 puffs every six (6) hours as needed for wheezing.     ??? aluminum-magnesium hydroxide-simethicone 200-200-20 mg/5 mL Susp 80  mL, diphenhydrAMINE 12.5 mg/5 mL Liqd 200 mg, nystatin 100,000 unit/mL Susp 8,000,000 Units, distilled water Liqd 80 mL, lidocaine 2% viscous 2 % Soln 80 mL Take 5 mL by mouth every six (6) hours as needed. Swish, gargle, spit or swallow if throat issues 80 mL 2   ??? benzonatate (TESSALON) 200 MG capsule Take 1 capsule (200 mg total) by mouth Three (3) times a day as needed for cough. 30 capsule 1   ??? capecitabine (XELODA) 500 MG tablet Take 1 tablet (500 mg total) by mouth two (2) times a day, continuously. (Patient not taking: Reported on 08/10/2021) 60 tablet 5   ??? clobetasoL (TEMOVATE) 0.05 % ointment Apply twice a day to rash on palm until smooth/clear. 30 g 1   ??? clonazePAM (KLONOPIN) 0.5 MG tablet Take 0.5 mg daily as needed for anxiety.  Do not exceed 0.5 mg/day. 30 tablet 0   ??? diclofenac sodium (VOLTAREN) 1 % gel Apply 2 g topically four (4) times a day as needed for arthritis or pain. (Patient not taking: Reported on 08/10/2021) 150 g 2   ??? dicyclomine (BENTYL) 10 mg capsule Take 1 capsule (10 mg total) by mouth four (4) times a day for 14 days. 56 capsule 0   ??? diphenoxylate-atropine (LOMOTIL) 2.5-0.025 mg per tablet Take 1 tablet by mouth 4 (four) times a day as needed for diarrhea. 30 tablet 1   ??? dorzolamide (TRUSOPT) 2 % ophthalmic solution Administer 1 drop into the left eye Three (3) times a day. 10 mL 12   ??? empagliflozin (JARDIANCE) 10 mg tablet Take 1 tablet (10 mg total) by mouth in the morning. 30 tablet 11   ??? esomeprazole (NEXIUM) 40 MG capsule Take 1 capsule (40 mg total) by mouth daily. 90 capsule 3   ??? HYDROcodone-acetaminophen (NORCO 10-325) 10-325 mg per tablet Take 1 tablet by mouth every twelve (12) hours as needed for pain. 60 tablet 0   ??? lidocaine (LIDODERM) 5 % patch Place 1 patch on the skin daily. Apply to affected area for 12 hours only each day (then remove patch) 30 patch 3   ??? lisinopriL-hydrochlorothiazide (PRINZIDE,ZESTORETIC) 20-25 mg per tablet Take 1 tablet by mouth in the morning. 90 tablet 3   ??? MAGNESIUM ORAL Take by mouth daily. OTC (Patient not taking: Reported on 08/10/2021)     ??? naloxone (NARCAN) 4 mg nasal spray One spray in either nostril once for known/suspected opioid overdose. May repeat every 2-3 minutes in alternating nostril til EMS arrives (Patient not taking: Reported on 08/10/2021) 2 each 0   ??? ondansetron (ZOFRAN) 4 MG tablet Take 1 tablet (4 mg total) by mouth every six (6) hours as needed for nausea. 30 tablet 1   ??? PAXLOVID CO-PACK, EUA, (PAXLOVID CO-PACK, EUA,) 300 mg (150 mg x 2)-100 mg tablet See package instructions. 30 tablet 0   ??? potassium chloride (KLOR-CON) 10 MEQ CR tablet Take 1 tablet (10 mEq total) by mouth Two (2) times a day. 180 tablet 0   ??? pregabalin (LYRICA) 200 MG capsule Take 1 capsule (200 mg total) by mouth Two (2) times a day. 60 capsule 2   ??? prochlorperazine (COMPAZINE) 10 MG tablet Take 1 tablet (10 mg total) by mouth every six (6) hours as needed for nausea. 30 tablet 6   ??? senna (SENOKOT) 8.6 mg tablet Take 3 tablets by mouth in the morning. 30 tablet 2   ??? thiamine HCl (VITAMIN B-1 ORAL) Take by mouth daily. ??? traZODone (  DESYREL) 50 MG tablet Take 1.5 tablets (75 mg total) by mouth nightly. 45 tablet 1   ??? tucatinib (TUKYSA) 150 mg tablet Take 1 tablet (150 mg total) by mouth two (2) times a day . 60 tablet 1   ??? tucatinib (TUKYSA) 50 mg tablet Take 2 tablets (100 mg total) by mouth two (2) times a day . 120 tablet 3   ??? venlafaxine (EFFEXOR-XR) 150 MG 24 hr capsule Take 2 capsules (300 mg total) by mouth in the morning. 60 capsule 2     No current facility-administered medications for this visit.

## 2021-09-15 ENCOUNTER — Ambulatory Visit: Admit: 2021-09-15 | Payer: MEDICARE

## 2021-09-17 NOTE — Unmapped (Signed)
9/22 spoke to [place] regarding her upper back pain.  Eventually ended up taking one quarter film of the Suboxone with relief from the back pain, but the Suboxone produces much fatigue and difficult with vision afterward.  Shares she has to rest after she takes it for several hours.  Not using the hydrocodone 10/325 mg tablets.    Plan  -Encouraged to use the hydrocodone 10/325 mg instead of the Suboxone.  Aware that she cannot drink alcohol with the hydrocodone/Tylenol.    Burtis Junes, FNP-BC, [facility]  Outpatient Oncology Palliative Care Service  [facility]  [address], [place], [place] [address]  [phone]

## 2021-09-21 ENCOUNTER — Ambulatory Visit: Admit: 2021-09-21 | Discharge: 2021-09-21 | Payer: MEDICARE | Attending: Internal Medicine | Primary: Internal Medicine

## 2021-09-21 ENCOUNTER — Ambulatory Visit: Admit: 2021-09-21 | Discharge: 2021-09-21 | Payer: MEDICARE

## 2021-09-21 DIAGNOSIS — Z515 Encounter for palliative care: Principal | ICD-10-CM

## 2021-09-21 DIAGNOSIS — R945 Abnormal results of liver function studies: Principal | ICD-10-CM

## 2021-09-21 NOTE — Unmapped (Signed)
Outpatient Oncology Social Work  Follow Up     ??  Palliative Care Update:  This SW'er sat in on palliative care appt today with Burtis Junes, NP, who assisted patient with reintroduction of anti-inflammatory medications to help with patients' arthritis-related pain in mid and upper back.   Pt is also encouraged to use hydrocodone as needed (she hasn't been using it).  Pt is also prescribed oral Voltaren.  Pt declined referral to physical therapy b/c it is too cumbersome for her to attend appts.    Personal Aide Update:  Pt reports that she never started Personal Care Services, although she was approved on 07/13/21 for 3 days per week to assist with bathing, meal prep and laundry.  Pt had requested that the referral be sent to [facility].   Pt says she never rec'd a call from the agency to initiate services. However, she also reports that she doesn't answer calls if she doesn't recognize the phone number, so it is likely that they called her and she never answered or responded to messages.  Pt states that she knows the person at the agency, and she will call her to find out what happened.  ??  Follow-Up Plan:  Pt has this SW'ers contact information and will reach out for psychosocial assistance as needed.  ??  Claris Gladden, OSW-C  Oncology Outpatient Social Worker  Phone: [phone]

## 2021-09-21 NOTE — Unmapped (Signed)
[facility], [place]        Referring Provider:  [name], MD  [address]  [facility]  [place],  [place] [address]     Primary Care Provider:  [name], MD    Other Specialist(s):         PATIENT PROFILE:        Brooke [name] is a 65 y.o. female (DOB: 04-14-1956) who is seen in consultation at the request of Dr. Vinson [name] for evaluation of abnormal LFTs in the setting of chemotherapy for metastatic breast cancer.         ASSESSMENT:        65 year old with mild elevations in AST/ALT over past year with moderate Alk phos elevation.      Patient likely has prevalent steatohepatitis from a combination of ETOH use (greater in past) and some metabolic syndrome risk factors.  Rapid weight fluctuations (both gaining and losing weight can contribute to steatosis as well).      Labs now normalized in the setting of ETOH abstinence      PLAN:         -labs reviewed and now normal  -congratulated on ETOH abstinence  -anti craving medicine and counseling offered, declined.  -rtc 6 months      CHIEF COMPLAINT: Abnormal LFTs    HISTORY OF PRESENT ILLNESS: This is a 65 y.o. year old female with metastatic breast cancer (lung/brain) who has had abnormal LFTs x ~ year with slowly increasing values in bothe transaminases and alk phos.  No liver specific symtpoms.  Has a prior history of heavy etoh use and also has some MetS risk factors (lipids and HTN)      Interval history    No liver related events.  No jaundice, encephalopathy, ascites or GI bleeding.  After last visit stopped ETOH entirely (previously up to 4 glasses wine/day).  Labs improved and now normal.  Some residual mild alk phos elevation, asymptomatic and may be from extra-hepatic source.    No craving, no withdrawal        REVIEW OF SYSTEMS:     The balance of 12 systems reviewed is negative except as noted in the HPI.     PAST MEDICAL HISTORY:    Past Medical History:   Diagnosis Date   ??? Abnormal ECG unsure   ??? Alcoholism (CMS-HCC)    ??? Allergic rhinitis    ??? Anemia    ??? Anxiety     insomnia   ??? Arthritis not sure   ??? Brain concussion    ??? Breast cancer (CMS-HCC)     chemo for now with mets   ??? COPD (chronic obstructive pulmonary disease) (CMS-HCC)    ??? Depression    ??? Dysphagia    ??? Fatty liver 07/08/2021   ??? Genitourinary disease    ??? GERD (gastroesophageal reflux disease)    ??? Headache    ??? HTN (hypertension)    ??? Hyperlipidemia    ??? Insomnia    ??? 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 Stroke (CMS-HCC) Nov. 2020    Dr. Theodoro Kalata   ??? Tobacco dependence        PAST SURGICAL HISTORY:    Past Surgical History:   Procedure Laterality Date   ??? CESAREAN SECTION     ??? CHOLECYSTECTOMY     ??? FINGER SURGERY Right     trigger finger   ??? GANGLION  CYST EXCISION Left     hand   ??? HYSTERECTOMY     ??? LYMPH NODE BIOPSY Right 02/08/2016    Axillary   ??? PR BRNSCHSC TNDSC EBUS DX/TX INTERVENTION PERPH LES Left 03/05/2020    Procedure: Bronch, Rigid Or Flexible, Including Fluoro Guidance, When Performed; With Transendoscopic Ebus During Bronchoscopic Diagnostic Or Therapeutic Intervention(S) For Peripheral Lesion(S);  Surgeon: Jerelyn Charles, MD;  Location: MAIN OR [facility];  Service: Pulmonary   ??? PR BRONCHOSCOPY,COMPUTER ASSIST/IMAGE-GUIDED NAVIGATION Left 03/05/2020    Procedure: BRONCHOSCOPY, RIGID OR FLEXIBLE, INCLUDE FLUORO WHEN PERFORMED; W/COMPUTER-ASSIST, IMAGE-GUIDED NAVIGATION;  Surgeon: Jerelyn Charles, MD;  Location: MAIN OR [facility];  Service: Pulmonary   ??? PR BRONCHOSCOPY,DIAGNOSTIC W LAVAGE Left 03/05/2020    Procedure: Bronchoscopy, Rigid Or Flexible, Include Fluoroscopic Guidance When Performed; W/Bronchial Alveolar Lavage;  Surgeon: Jerelyn Charles, MD;  Location: MAIN OR [facility];  Service: Pulmonary   ??? PR BRONCHOSCOPY,TRANSBRON ASPIR BX Left 03/05/2020    Procedure: Bronchoscopy, Rigid/Flex, Incl Fluoro; W/Transbronch Ndl Aspirat Bx, Trachea, Main Stem &/Or Lobar Bronchus;  Surgeon: Jerelyn Charles, MD;  Location: MAIN OR [facility]; Service: Pulmonary   ??? PR BRONCHOSCOPY,TRANSBRONCH BIOPSY Left 03/05/2020    Procedure: Bronchoscopy, Rigid/Flexible, Include Fluoro Guidance When Performed; W/Transbronchial Lung Bx, Single Lobe;  Surgeon: Jerelyn Charles, MD;  Location: MAIN OR [facility];  Service: Pulmonary   ??? PR CATH PLACE/CORON ANGIO, IMG SUPER/INTERP,W LEFT HEART VENTRICULOGRAPHY N/A 05/07/2020    Procedure: Left Heart Catheterization;  Surgeon: Lesle Reek, MD;  Location: [facility] CATH;  Service: Cardiology   ??? PR COLONOSCOPY W/BIOPSY SINGLE/MULTIPLE N/A 10/21/2016    Procedure: COLONOSCOPY, FLEXIBLE, PROXIMAL TO SPLENIC FLEXURE; WITH BIOPSY, SINGLE OR MULTIPLE;  Surgeon: Monte Fantasia, MD;  Location[facility] GI PROCEDURES [facility];  Service: Gastroenterology   ??? PR EXCIS SUPRATENT BRAIN TUMOR Right 01/15/2019    Procedure: CRANIECTOMY; EXC BRAIN TUMOR-SUPRATENTORIAL;  Surgeon: Edison Simon, MD;  Location: MAIN OR [facility];  Service: Neurosurgery   ??? PR INCISE FINGER TENDON SHEATH Left 06/17/2019    Procedure: R-20 TENDON SHEATH INCISION (EG, FOR TRIGGER FINGER);  Surgeon: Daisy Lazar, MD;  Location: ASC OR [facility];  Service: Orthopedics   ??? PR MICROSURG TECHNIQUES,REQ OPER MICROSCOPE Right 01/15/2019    Procedure: MICROSURGICAL TECHNIQUES, REQUIRING USE OF OPERATING MICROSCOPE (LIST SEPARATELY IN ADDITION TO CODE FOR PRIMARY PROCEDURE);  Surgeon: Edison Simon, MD;  Location: MAIN OR [facility];  Service: Neurosurgery   ??? PR STEREOTACTIC COMP ASSIST PROC,CRANIAL,INTRADURAL Right 01/15/2019    Procedure: STEREOTACTIC COMPUTER-ASSISTED (NAVIGATIONAL) PROCEDURE; CRANIAL, INTRADURAL;  Surgeon: Edison Simon, MD;  Location: MAIN OR [facility];  Service: Neurosurgery   ??? PR UPPER GI ENDOSCOPY,BIOPSY N/A 10/21/2016    Procedure: UGI ENDOSCOPY; WITH BIOPSY, SINGLE OR MULTIPLE;  Surgeon: Monte Fantasia, MD;  [facility]: [facility]ED[facility];  Service: Gastroenterology   ??? PR UPPER GI ENDOSCOPY,BIOPSY N/A 04/02/2018    Procedure: UGI ENDOSCOPY; WITH BIOPSY, SINGLE OR MULTIPLE;  Surgeon: Wendall Papa, MD;  [facility];  Service: Gastroenterology   ??? SKIN BIOPSY         MEDICATIONS:      Current Outpatient Medications:   ???  albuterol HFA 90 mcg/actuation inhaler, Inhale 2 puffs every six (6) hours as needed for wheezing., Disp: , Rfl:   ???  aluminum-magnesium hydroxide-simethicone 200-200-20 mg/5 mL Susp 80 mL, diphenhydrAMINE 12.5 mg/5 mL Liqd 200 mg, nystatin 100,000 unit/mL Susp 8,000,000 Units, distilled water Liqd 80 mL, lidocaine 2% viscous  2 % Soln 80 mL, Take 5 mL by mouth every six (6) hours as needed. Swish, gargle, spit or swallow if throat issues, Disp: 80 mL, Rfl: 2  ???  benzonatate (TESSALON) 200 MG capsule, Take 1 capsule (200 mg total) by mouth Three (3) times a day as needed for cough., Disp: 30 capsule, Rfl: 1  ???  capecitabine (XELODA) 500 MG tablet, Take 1 tablet (500 mg total) by mouth two (2) times a day, continuously., Disp: 60 tablet, Rfl: 5  ???  clobetasoL (TEMOVATE) 0.05 % ointment, Apply twice a day to rash on palm until smooth/clear., Disp: 30 g, Rfl: 1  ???  clonazePAM (KLONOPIN) 0.5 MG tablet, Take 0.5 mg daily as needed for anxiety.  Do not exceed 0.5 mg/day., Disp: 30 tablet, Rfl: 0  ???  diclofenac sodium (VOLTAREN) 1 % gel, Apply 2 g topically four (4) times a day as needed for arthritis or pain., Disp: 150 g, Rfl: 2  ???  dicyclomine (BENTYL) 10 mg capsule, Take 1 capsule (10 mg total) by mouth four (4) times a day for 14 days., Disp: 56 capsule, Rfl: 0  ???  diphenoxylate-atropine (LOMOTIL) 2.5-0.025 mg per tablet, Take 1 tablet by mouth 4 (four) times a day as needed for diarrhea., Disp: 30 tablet, Rfl: 1  ???  dorzolamide (TRUSOPT) 2 % ophthalmic solution, Administer 1 drop into the left eye Three (3) times a day., Disp: 10 mL, Rfl: 12  ???  esomeprazole (NEXIUM) 40 MG capsule, Take 1 capsule (40 mg total) by mouth daily., Disp: 90 capsule, Rfl: 3  ??? FARXIGA 5 mg Tab tablet, Take 5 mg by mouth daily., Disp: , Rfl:   ???  HYDROcodone-acetaminophen (NORCO 10-325) 10-325 mg per tablet, Take 1 tablet by mouth every twelve (12) hours as needed for pain., Disp: 60 tablet, Rfl: 0  ???  lidocaine (LIDODERM) 5 % patch, Place 1 patch on the skin daily. Apply to affected area for 12 hours only each day (then remove patch), Disp: 30 patch, Rfl: 3  ???  lisinopriL-hydrochlorothiazide (PRINZIDE,ZESTORETIC) 20-25 mg per tablet, Take 1 tablet by mouth in the morning., Disp: 90 tablet, Rfl: 3  ???  MAGNESIUM ORAL, Take by mouth daily. OTC, Disp: , Rfl:   ???  naloxone (NARCAN) 4 mg nasal spray, One spray in either nostril once for known/suspected opioid overdose. May repeat every 2-3 minutes in alternating nostril til EMS arrives, Disp: 2 each, Rfl: 0  ???  ondansetron (ZOFRAN) 4 MG tablet, Take 1 tablet (4 mg total) by mouth every six (6) hours as needed for nausea., Disp: 30 tablet, Rfl: 1  ???  PAXLOVID CO-PACK, EUA, (PAXLOVID CO-PACK, EUA,) 300 mg (150 mg x 2)-100 mg tablet, See package instructions., Disp: 30 tablet, Rfl: 0  ???  potassium chloride (KLOR-CON) 10 MEQ CR tablet, Take 1 tablet (10 mEq total) by mouth Two (2) times a day., Disp: 180 tablet, Rfl: 0  ???  pregabalin (LYRICA) 200 MG capsule, Take 1 capsule (200 mg total) by mouth Two (2) times a day., Disp: 60 capsule, Rfl: 2  ???  prochlorperazine (COMPAZINE) 10 MG tablet, Take 1 tablet (10 mg total) by mouth every six (6) hours as needed for nausea., Disp: 30 tablet, Rfl: 6  ???  senna (SENOKOT) 8.6 mg tablet, Take 3 tablets by mouth in the morning., Disp: 30 tablet, Rfl: 2  ???  thiamine HCl (VITAMIN B-1 ORAL), Take by mouth daily. , Disp: , Rfl:   ???  traZODone (DESYREL) 50  MG tablet, Take 1.5 tablets (75 mg total) by mouth nightly., Disp: 45 tablet, Rfl: 1  ???  tucatinib (TUKYSA) 150 mg tablet, Take 1 tablet (150 mg total) by mouth two (2) times a day ., Disp: 60 tablet, Rfl: 1  ???  tucatinib (TUKYSA) 50 mg tablet, Take 2 tablets (100 mg total) by mouth two (2) times a day ., Disp: 120 tablet, Rfl: 3  ???  UNABLE TO FIND, Methadone film, Disp: , Rfl:   ???  venlafaxine (EFFEXOR-XR) 150 MG 24 hr capsule, Take 2 capsules (300 mg total) by mouth in the morning., Disp: 60 capsule, Rfl: 2  ???  empagliflozin (JARDIANCE) 10 mg tablet, Take 1 tablet (10 mg total) by mouth in the morning. (Patient not taking: Reported on 09/21/2021), Disp: 30 tablet, Rfl: 11    ALLERGIES:    Adhesive; Decadron [dexamethasone]; Suboxone [buprenorphine-naloxone]; Tetracycline; Oxycodone; Skin protectants, misc.; and Tegaderm adhesive-no drug-allergy check    SOCIAL HISTORY:    Social History     Socioeconomic History   ??? Marital status: Divorced     Spouse name: None   ??? Number of children: None   ??? Years of education: None   ??? Highest education level: None   Tobacco Use   ??? Smoking status: Current Every Day Smoker     Packs/day: 1.00     Years: 40.00     Pack years: 40.00     Types: Cigarettes   ??? Smokeless tobacco: Never Used   ??? Tobacco comment: 10-20 single    Vaping Use   ??? Vaping Use: Never used   Substance and Sexual Activity   ??? Alcohol use: Not Currently     Alcohol/week: 0.0 standard drinks     Comment: Have stopped about 6 months ago   ??? Drug use: No   ??? Sexual activity: Yes     Partners: Male     Birth control/protection: Post-menopausal   Other Topics Concern   ??? Exercise No   ??? Living Situation Yes   ??? Do you use sunscreen? No   ??? Tanning bed use? No   Social History Narrative    She lives in senior housing in [place] x2 months. he has 4 sisters, 2 nearby and involved in care. She has 1 son-[name], recently married 37 yo, who lives about an hour and a half away in [place]. He works full-time as a Sales executive. She is still a chronic smoker (1ppd). She is currently divorced.         Goes to [employer] in [place] and gets a lot of support. Her church friend [name] and her sisters goes with her to chemo appts.     Social Determinants of Health     Financial Resource Strain: Medium Risk   ??? Difficulty of Paying Living Expenses: Somewhat hard   Food Insecurity: Food Insecurity Present   ??? Worried About Programme researcher, broadcasting/film/video in the Last Year: Sometimes true   ??? Ran Out of Food in the Last Year: Sometimes true       FAMILY HISTORY:    family history includes COPD in her mother; Cancer in her father, maternal grandfather, maternal uncle, maternal uncle, and paternal aunt; Diabetes in her sister, sister, and sister; Heart attack in her brother, father, and sister; Heart disease in her brother, maternal grandmother, and sister; Heart failure in her brother, father, and sister; Hypertension in her brother, mother, sister, sister, sister, and sister; No Known Problems in her  maternal aunt, paternal aunt, paternal grandfather, paternal grandmother, paternal uncle, and another family member.      VITAL SIGNS:    BP 116/64 (BP Site: R Arm, BP Position: Sitting)  - Pulse 70  - Temp 36.7 ??C (98.1 ??F) (Tympanic)  - Ht 157.5 cm (5' 2)  - Wt 54.4 kg (120 lb)  - LMP  (LMP Unknown)  - SpO2 96%  - BMI 21.95 kg/m??   Body mass index is 21.95 kg/m??.    PHYSICAL EXAM:    Normal comprehensive exam:      Constitutional:   Alert, oriented x 3, no acute distress, well nourished   Mental Status:   Thought organized, appropriate affect, normal fluent speech.   HEENT:   PEERL, conjunctiva clear, anicteric, oropharynx clear, neck supple, no LAD.   Respiratory: Clear to auscultation, and percussion to the bases, unlabored breathing.     Cardiac: Regular rate and rhythm normal S1 and S2, no murmur.      Abdomen: Soft, non-distended, non-tender, no organomegaly or masses.     Perianal/Rectal Exam Not performed.     Extremities:   No edema, well perfused.   Musculoskeletal: No joint swelling or tenderness noted, no deformities.     Skin: No rashes, jaundice or skin lesions noted.     Neuro: No focal deficits.          DIAGNOSTIC STUDIES:  I have reviewed all pertinent diagnostic studies, including:      Radiographic studies:    reviewed  Laboratory results:    No visits with results within 1 Week(s) from this visit.   Latest known visit with results is:   Lab on 09/10/2021   Component Date Value Ref Range Status   ??? Sodium 09/10/2021 141  135 - 145 mmol/L Final   ??? Potassium 09/10/2021 3.3 (A) 3.4 - 4.8 mmol/L Final   ??? Chloride 09/10/2021 107  98 - 107 mmol/L Final   ??? CO2 09/10/2021 25.5  20.0 - 31.0 mmol/L Final   ??? Anion Gap 09/10/2021 9  5 - 14 mmol/L Final   ??? BUN 09/10/2021 <5 (A) 9 - 23 mg/dL Final   ??? Creatinine 09/10/2021 0.65  0.60 - 0.80 mg/dL Final   ??? eGFR CKD-EPI (2021) Female 09/10/2021 >90  >=60 mL/min/1.40m2 Final   ??? Glucose 09/10/2021 97  70 - 179 mg/dL Final   ??? Calcium 10/96/0454 8.9  8.7 - 10.4 mg/dL Final   ??? Albumin 09/81/1914 3.6  3.4 - 5.0 g/dL Final   ??? Total Protein 09/10/2021 7.1  5.7 - 8.2 g/dL Final   ??? Total Bilirubin 09/10/2021 0.4  0.3 - 1.2 mg/dL Final   ??? AST 78/29/5621 27  <=34 U/L Final   ??? ALT 09/10/2021 15  10 - 49 U/L Final   ??? Alkaline Phosphatase 09/10/2021 214 (A) 46 - 116 U/L Final   ??? WBC 09/10/2021 8.1  3.6 - 11.2 10*9/L Final   ??? RBC 09/10/2021 3.88 (A) 3.95 - 5.13 10*12/L Final   ??? HGB 09/10/2021 13.8  11.3 - 14.9 g/dL Final   ??? HCT 30/86/5784 40.0  34.0 - 44.0 % Final   ??? MCV 09/10/2021 103.3 (A) 77.6 - 95.7 fL Final   ??? MCH 09/10/2021 35.6 (A) 25.9 - 32.4 pg Final   ??? MCHC 09/10/2021 34.5  32.0 - 36.0 g/dL Final   ??? RDW 69/62/9528 18.6 (A) 12.2 - 15.2 % Final   ??? MPV 09/10/2021 7.2  6.8 - 10.7 fL Final   ???  Platelet 09/10/2021 190  150 - 450 10*9/L Final   ??? nRBC 09/10/2021 0  <=4 /100 WBCs Final   ??? Neutrophils % 09/10/2021 72.6  % Final   ??? Lymphocytes % 09/10/2021 17.9  % Final   ??? Monocytes % 09/10/2021 8.8  % Final   ??? Eosinophils % 09/10/2021 0.4  % Final   ??? Basophils % 09/10/2021 0.3  % Final   ??? Absolute Neutrophils 09/10/2021 5.9  1.8 - 7.8 10*9/L Final   ??? Absolute Lymphocytes 09/10/2021 1.4  1.1 - 3.6 10*9/L Final   ??? Absolute Monocytes 09/10/2021 0.7  0.3 - 0.8 10*9/L Final   ??? Absolute Eosinophils 09/10/2021 0.0  0.0 - 0.5 10*9/L Final   ??? Absolute Basophils 09/10/2021 0.0  0.0 - 0.1 10*9/L Final   ??? Anisocytosis 09/10/2021 Moderate (A) Not Present Final

## 2021-09-23 NOTE — Unmapped (Signed)
OUTPATIENT ONCOLOGY PALLIATIVE CARE    Principal Diagnosis: Brooke [name] is a 65 y.o. female with metastatic breast cancer,  diagnosed in 2017.  Disease sites include lung and brain.     Assessment/Plan:   1.Neuropathic pain in hands and feet-stable and continued mid upper back pain radiating to lower back appears to be arthritic.  Hx of cervical thoracic paraspinal trigger point bilateral this past July 2022 with not as much relief as she has had in the past.     -Continue lyrica 200 mg bid dosing-some relief with this.  -Restart hydrocodone 10/325 mg 1 tab 3 times a day as needed for pain.  Is aware that she cannot drink alcohol.  -Will connect with team to see if I can start diclofenac 75 mg twice daily as needed.  Platelets have recently been stable for the last decline in April.  -Continue Effexor 225 mg every day  -Continue diclofenac gel as needed  -In the past: Not able to tolerate NSAIDs (due to platelets) and tylenol (recent elevated liver enzymes)      Opioid use in past:  -Had difficulty tolerating the methadone 2.5 mg dosing due to sedation.  Did not try the 1 mg methadone, so could consider this in the future if needed for the neuropathic discomfort.  Some reluctance in re-starting methadone.  -No relief with tramadol  -oxycodone-night terrors.  -Suboxone-hallucinations    2.  Support-  -showed Brooke [name] how to use the video applications so that we can do a video visit with upcoming appointments.  She did get a new smart phone with a camera.      3.  Goals of care    At prior visits: goals which are to decrease pain and to maintain her independence. Lives alone and Brooke [name] lives close by.     Wants to ensure that her quality of life remains high and the wish to keep on going.  Shared concern that she does not want to be in pain.  NP provided reassurance that our team can offer her different treatment strategies to help her with her goal.     Advance care planning-see advance care planning note dated 01/31/2020.  -at prior visits: Patient shared that she has been dreaming about death. She does have her funeral arrangements completed. Shared that she has her wishes written down and her Sister Brooke [name] knows where they are regarding her funeral. She still contemplating whether to be cremated or buried and she is leaning toward cremation. patient currently focused on cancer directed therapy.  Prefers to stay in the moment and not discussed things.  We will continue to support and address if she has a change in clinical condition.        -Advance directives scanned in on 11/02/19        HCDM Brooke [name]): Brooke [name] - Sister - [phone]    HCDM, First AlternateJaneece [name] - Sister - [phone]    At prior visits,   # Controlled substances risk management.  We are not currently prescribing controlled medications for her.   - Patient has a signed pain medication agreement with Outpt Palliative care, completed on 11/01/18, as per standard care. This was signed again on 01/28/19   - [place] database was reviewed today and it was appropriate.   - Urine drug screen was not performed at this visit. Findings: not applicable.   - Patient has received information about safe storage and administration of medications.   - Patient has received a prescription for  narcan and shared with Brooke [name] and pt.       F/u: 1 month video and then on 11/23 in clinic    ----------------------------------------  Referring Provider: From inpatient oncology team  Oncology Team: Breast team-Dr. Archie [name]  PCP: Brooke Milliner, MD      HPI: 65 year old woman with her to overexpressing metastatic breast cancer to her lung and brain.  Was found to have multiple small brain metastases and completed CyberKnife therapy on September 11. Describes ongoing pain in her pelvis, rates it as a 3 out of 10 today.  Has been improving over the last week or 2.  Feels like gabapentin has been helpful, and is also responded well to Tylenol and ibuprofen in the past.  She is taken oxycodone and it gave her night terrors does not want to take it again.  Has taken Dilaudid previously and did not have side effects from this.    Current cancer-directed therapy: capecitabine (Xeloda), tucatinib, and trastuzumab (Herceptin).     Diagnosis of COVID (07/18/21)      Interval hx 09/21/21 NP, Brooke [name] and Brooke [name] social worker    -Overall doing okay  -Grateful that the visit would be liver doctor went well for her.  -Appreciating mid upper back pain that radiates down to her lower back and her upper rib cage.  Describes it as a squeezing sensation.  Pain is worse with with movement or at the end of the day if she has been very active.  -Has not been taking hydrocodone.  -Not some much relief with the lidocaine patches.  -Appetite is fair  -No problems with constipation or diarrhea at the present time.  -Mood has been okay.  -Really not able to get everything she wants to get done in her home with the cleaning due to the pain and not having as much energy as she like to have.    Palliative Performance Scale: 70% - Ambulation: Reduced / unable to do normal work, some evidence of disease / Self-Care: Full / Intake: Normal or reduced / Level of Conscious: Full         Coping/Support Issues: Patient reports she is been able to attend church functions and is finding this very helpful. Has boyfriend Brooke [name] for the last 15 years.     At prior visits, patient did share that she continues to receive much help from her church friend, Brooke [name] and her husband.  They prayed together and she is found this supportive.  She states that her 2 sisters have been supportive, but she feels that they are becoming more less patient with her due to patient's irritability.     Overall coping well, no specific issues identified.  Finding support with her church community, her sister, Brooke [name], and prayer.         Social History: Pt lives in senior housing in [place], says it's ok and quiet but she is from [place] and liked it there more. She has 4 sisters, 2 nearby and involved in care. She has 1 son-Brooke [name], 35yo, who lives about an hour and a half away in [place]. She is divorced.  ??  Goes to [employer] in [place] and gets a lot of support. Her church friend Brooke [name] goes with her to chemo appts. Her sisters will go too.    Advance Care Planning: Advance directives scanned into system  HCPOA: See ACP note  Living Will: See ACP note  ACP note: Yes, advance directive scanned in  on November 6    Objective     Opioid Risk Tool:      Opioid Risk Tool:   Female  Female    Family history of substance abuse      Alcohol   1  3    Illegal drugs  2  3    Rx drugs  4  4    Personal history of substance abuse      Alcohol  3  3    Illegal drugs  4  4    Rx drugs  5  5    Age between 16--45 years  1  1    History of preadolescent sexual abuse  3 0    Psychological disease      ADD, OCD, bipolar, schizophrenia  2  2    Depression  1  1    Total: 7  (<3 low risk, 4-7 moderate risk, >8 high risk)      Oncology History Overview Note   Identifying Statement:  Kasia Trego is a 65 y.o. female diagnosed with a right posterior frontal lobe metastasis (breast primary) status post resection 01/15/2019 and 5/5 fractions of SBRT (2500 Gy via CyberKnife) to the resection cavity.    Treatment History:  09/05/17: S/p SRS to 7 lesions  01/15/19: S/p resection, followed by SRS 2500 cGy   03/04/19: KPS 80; MRI with postsurgical changes versus residual disease; RTC in 2 weeks w/ MRI   04/15/19: KPS NA; MRI w/ SD; no study; RTC 6 weeks w/ MRI   06/10/19: KPS 80; MRI w/ SD; RTC 4 weeks  07/15/19: KPS 80; MRI w/ SD; RTC 8 weeks  08/19/19: KPS 80; start nortriptyline for HAs; RTC 4 weeks w/ MRI  09/16/19: KPS 80; con't nortriptyline for HAs; RTC 2 mos w/ MRI  11/11/19: KPS 80; MRI w/ SD though w/ small infarct; proceed w/ CVA workup; start 81mg  ASA; encouraged smoking cessation; con't nortriptyline; RTC to discuss results  11/25/19: KPS 80; discussed CVA workup results; MRA unremarkable; con't smoking cessation efforts; con't nortriptyline for HAs; con't 81mg  ASA, start Lipitor; f/u with PCP for COPD eval; RTC 2 mos w/ MRI     Malignant neoplasm of overlapping sites of right female breast (CMS-HCC)   2017 -  Presenting Symptoms    Large open wound RT breast which began as nipple inversion. Patient states that she ignored for a long time.  Physical exam of the area of concern in the RTbreast demonstrates a large open wound in the lateral right breast. Saw PCP 01/27/16 Rx Keflex.     02/04/2016 Interval Scan(s)    MMG/US: 3.1 (3.0) cm irregular spiculated dense mass in lateral Rt breast w nipple and skin retraction. 2 subcentimeter irregular masses in the RUOQ  (10', 11') (possible satellite lesions). Large Rt axillary lymph node 1.7. Lt breast clear. BIRAD 5.     02/08/2016 Biopsy    US guided Rt. Axillary LN biopsy:  Gr 3, IDC with apocrine features. ER (-), PR (31-40), HER-2 (3+). Noted to have multiple skin lesions. Poor access to breast mass due open 10-15 cm wound risk of pain and bleeding.     02/09/2016 Initial Diagnosis    Malignant neoplasm of overlapping sites of right female breast (RAF-HCC)     02/10/2016 Interval Scan(s)    CT CAP: Large, necrotic right breast mass with wide open tract to the skin, consistent with known right breast malignancy.  Numerous enlarged right axillary, subpectoral, mediastinal, bilateral  hilar lymph nodes and innumerable bilateral pulmonary nodules     02/10/2016 Interval Scan(s)    NM Bone scan: No osseous metastatic disease.     04/28/2016 - 03/10/2020 Chemotherapy    OP TRASTUZUMAB (EVERY 21 DAYS)  Trastuzumab 8 mg/kg loading then 6 mg/kg every 21 days     07/13/2016 Genetics    STRATA  ??? ERBB2 copy number alteration  Estimated copy number: 40, confidence interval: 35.7 - 44.0, cellularity: 50%  Associated FDA-approved targeted therapies in breast cancer: trastuzumab, lapatinib, ado-trastuzumab emtansine, pertuzumab  ??? PIK3CA p.[id number]     01/03/2020 -  Cancer Staged    CT CAP 01/03/2020 which showed a new lingular opacity measuring 1.9 cm in the right lung. There was also slight increase in the right breast mass from 1.2-> 1.5 cm.  Bone scan shows no osseous metastases. Overall I considered these findings indeterminate and ppted to repeat CT CAP in 1 month     02/03/2020 -  Cancer Staged    CT chest 02/03/20 which showed that the lingular lesion has persisted and increased in size.  No mediastinal adenopathy reported.  She continues to report a nonproductive cough and mild dyspnea.  She is a chronic smoker and currently smokes on average half a pack per day.  My major concern is that this could be a second primary lung cancer as her other sites of diseases stable on current systemic therapy       02/03/2020 Endocrine/Hormone Therapy    Stop Tamoxifen, Add Fasoldex, continue Q3week Hercpetin     03/23/2020 - 02/09/2021 Chemotherapy    OP BREAST ADO-TRASTUZUMAB EMTANSINE  ado-trastuzumab emtansine 3.6 mg/kg IV on day 1, every 21 days     04/12/2021 -  Chemotherapy    Tucatinib + Capecitabine  OP TRASTUZUMAB (EVERY 21 DAYS)  trastuzumab 8 mg/kg IV LOADING, then 6 mg/kg IV MAINT, every 21 days     Brain metastases (CMS-HCC)   08/07/2017 Initial Diagnosis    Brain metastases (CMS-HCC)    Summary of Radiosurgery   Rx:7 brain met: 09/05/2017: 2,000/2,000 cGy  Sec:R Frontal: 09/05/2017: 2,000 cGy  Sec:L Post Fr: 09/05/2017: 2,000 cGy  Sec:L Ant Fro: 09/05/2017: 2,000 cGy  Sec:R Sup Oc: 09/05/2017: 2,000 cGy  Sec:L [place]: 09/05/2017: 2,000 cGy  Sec:R Inf Occi: 09/05/2017: 2,000 cGy  Sec:L Tempor: 09/05/2017: 2,000 cGy  Rx:Lt Med Oc: 09/13/2018: 2,000/2,000 cGy  Rx:Rt Frontal: : 2,500/2,500 cGy    01/15/2019: Craniotomy and resection of right posterior frontal lobe metastasis. Followed by [facility]    03/04/19: KPS 80; MRI with postsurgical changes versus residual disease; RTC in 2 weeks w/ MRI     01/03/2020 -  Cancer Staged    CT CAP 01/03/2020 which showed a new lingular opacity measuring 1.9 cm in the right lung. There was also slight increase in the right breast mass from 1.2-> 1.5 cm.  Bone scan shows no osseous metastases. Overall I considered these findings indeterminate and ppted to repeat CT CAP in 1 month     02/03/2020 -  Cancer Staged    CT chest 02/03/20 which showed that the lingular lesion has persisted and increased in size.  No mediastinal adenopathy reported.  She continues to report a nonproductive cough and mild dyspnea.  She is a chronic smoker and currently smokes on average half a pack per day.  My major concern is that this could be a second primary lung cancer as her other sites of diseases stable on current systemic therapy  02/03/2020 Endocrine/Hormone Therapy    Stop Tamoxifen, Add Fasoldex, continue Q3week Hercpetin     Metastatic breast cancer (CMS-HCC)   01/25/2019 Initial Diagnosis    Metastatic breast cancer (CMS-HCC)     03/23/2020 - 02/09/2021 Chemotherapy    OP BREAST ADO-TRASTUZUMAB EMTANSINE  ado-trastuzumab emtansine 3.6 mg/kg IV on day 1, every 21 days     04/12/2021 -  Chemotherapy    Tucatinib + Capecitabine  OP TRASTUZUMAB (EVERY 21 DAYS)  trastuzumab 8 mg/kg IV LOADING, then 6 mg/kg IV MAINT, every 21 days         Patient Active Problem List   Diagnosis   ??? Malignant neoplasm of overlapping sites of right female breast (CMS-HCC)   ??? Peripheral neuropathy   ??? Insomnia   ??? Brain metastases (CMS-HCC)   ??? Nausea   ??? Closed fracture of one rib with routine healing   ??? Diarrhea of presumed infectious origin   ??? Failure to thrive in adult   ??? Tobacco use   ??? Chronic diarrhea   ??? Depression   ??? Hyponatremia   ??? Back pain   ??? Closed fracture of multiple pubic rami, right, initial encounter (CMS-HCC)   ??? Macrocytosis   ??? Pelvic fracture (CMS-HCC)   ??? Metastatic breast cancer (CMS-HCC)   ??? Migraine without aura and without status migrainosus, not intractable   ??? Essential hypertension   ??? CVA (cerebral vascular accident) (CMS-HCC)   ??? DDD (degenerative disc disease), cervical   ??? Wheezing   ??? Dyslipidemia   ??? Angina pectoris (CMS-HCC)   ??? Papillary fibroelastoma of heart   ??? Antineoplastic chemotherapy induced anemia   ??? Celiac disease   ??? Iron deficiency anemia due to chronic blood loss   ??? Dry eye syndrome, bilateral   ??? Meibomian gland dysfunction (MGD) of both eyes   ??? Glaucoma suspect of both eyes   ??? Incipient cataract of both eyes   ??? 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 Fatty liver       Past Medical History:   Diagnosis Date   ??? Abnormal ECG unsure   ??? Alcoholism (CMS-HCC)    ??? Allergic rhinitis    ??? Anemia    ??? Anxiety     insomnia   ??? Arthritis not sure   ??? Brain concussion    ??? Breast cancer (CMS-HCC)     chemo for now with mets   ??? COPD (chronic obstructive pulmonary disease) (CMS-HCC)    ??? Depression    ??? Dysphagia    ??? Fatty liver 07/08/2021   ??? Genitourinary disease    ??? GERD (gastroesophageal reflux disease)    ??? Headache    ??? HTN (hypertension)    ??? Hyperlipidemia    ??? Insomnia    ??? 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 Stroke (CMS-HCC) Nov. 2020    Dr. Theodoro Kalata   ??? Tobacco dependence        Past Surgical History:   Procedure Laterality Date   ??? CESAREAN SECTION     ??? CHOLECYSTECTOMY     ??? FINGER SURGERY Right     trigger finger   ??? GANGLION CYST EXCISION Left     hand   ??? HYSTERECTOMY     ??? LYMPH NODE BIOPSY Right 02/08/2016    Axillary   ??? PR BRNSCHSC TNDSC EBUS DX/TX INTERVENTION PERPH LES Left 03/05/2020    Procedure: Bronch, Rigid Or Flexible,  Including Fluoro Guidance, When Performed; With Transendoscopic Ebus During Bronchoscopic Diagnostic Or Therapeutic Intervention(S) For Peripheral Lesion(S);  Surgeon: Jerelyn Charles, MD;  Location: MAIN OR [facility];  Service: Pulmonary   ??? PR BRONCHOSCOPY,COMPUTER ASSIST/IMAGE-GUIDED NAVIGATION Left 03/05/2020    Procedure: BRONCHOSCOPY, RIGID OR FLEXIBLE, INCLUDE FLUORO WHEN PERFORMED; W/COMPUTER-ASSIST, IMAGE-GUIDED NAVIGATION;  Surgeon: Jerelyn Charles, MD;  Location: MAIN OR [facility];  Service: Pulmonary   ??? PR BRONCHOSCOPY,DIAGNOSTIC W LAVAGE Left 03/05/2020    Procedure: Bronchoscopy, Rigid Or Flexible, Include Fluoroscopic Guidance When Performed; W/Bronchial Alveolar Lavage;  Surgeon: Jerelyn Charles, MD;  Location: MAIN OR [facility];  Service: Pulmonary   ??? PR BRONCHOSCOPY,TRANSBRON ASPIR BX Left 03/05/2020    Procedure: Bronchoscopy, Rigid/Flex, Incl Fluoro; W/Transbronch Ndl Aspirat Bx, Trachea, Main Stem &/Or Lobar Bronchus;  Surgeon: Jerelyn Charles, MD;  Location: MAIN OR [facility];  Service: Pulmonary   ??? PR BRONCHOSCOPY,TRANSBRONCH BIOPSY Left 03/05/2020    Procedure: Bronchoscopy, Rigid/Flexible, Include Fluoro Guidance When Performed; W/Transbronchial Lung Bx, Single Lobe;  Surgeon: Jerelyn Charles, MD;  Location: MAIN OR [facility];  Service: Pulmonary   ??? PR CATH PLACE/CORON ANGIO, IMG SUPER/INTERP,W LEFT HEART VENTRICULOGRAPHY N/A 05/07/2020    Procedure: Left Heart Catheterization;  Surgeon: Lesle Reek, MD;  Location: [facility] CATH;  Service: Cardiology   ??? PR COLONOSCOPY W/BIOPSY SINGLE/MULTIPLE N/A 10/21/2016    Procedure: COLONOSCOPY, FLEXIBLE, PROXIMAL TO SPLENIC FLEXURE; WITH BIOPSY, SINGLE OR MULTIPLE;  Surgeon: Monte Fantasia, MD;  Location: GI PROCEDURES [facility];  Service: Gastroenterology   ??? PR EXCIS SUPRATENT BRAIN TUMOR Right 01/15/2019    Procedure: CRANIECTOMY; EXC BRAIN TUMOR-SUPRATENTORIAL;  Surgeon: Edison Simon, MD;  Location: MAIN OR Brooke [name];  Service: Neurosurgery   ??? PR INCISE FINGER TENDON SHEATH Left 06/17/2019    Procedure: R-20 TENDON SHEATH INCISION (EG, FOR TRIGGER FINGER);  Surgeon: Daisy Lazar, MD;  Location: ASC OR [facility];  Service: Orthopedics   ??? PR MICROSURG TECHNIQUES,REQ OPER MICROSCOPE Right 01/15/2019    Procedure: MICROSURGICAL TECHNIQUES, REQUIRING USE OF OPERATING MICROSCOPE (LIST SEPARATELY IN ADDITION TO CODE FOR PRIMARY PROCEDURE);  Surgeon: Edison Simon, MD;  [facility];  Service: Neurosurgery   ??? PR STEREOTACTIC COMP ASSIST PROC,CRANIAL,INTRADURAL Right 01/15/2019    Procedure: STEREOTACTIC COMPUTER-ASSISTED (NAVIGATIONAL) PROCEDURE; CRANIAL, INTRADURAL;  Surgeon: Edison Simon, MD;  Location: MAIN OR [facility];  Service: Neurosurgery   ??? PR UPPER GI ENDOSCOPY,BIOPSY N/A 10/21/2016    Procedure: UGI ENDOSCOPY; WITH BIOPSY, SINGLE OR MULTIPLE;  Surgeon: Monte Fantasia, MD;  Location: GI PROCEDURES [facility];  Service: Gastroenterology   ??? PR UPPER GI ENDOSCOPY,BIOPSY N/A 04/02/2018    Procedure: UGI ENDOSCOPY; WITH BIOPSY, SINGLE OR MULTIPLE;  Surgeon: Wendall Papa, MD;  [facility];  Service: Gastroenterology   ??? SKIN BIOPSY         Current Outpatient Medications   Medication Sig Dispense Refill   ??? albuterol HFA 90 mcg/actuation inhaler Inhale 2 puffs every six (6) hours as needed for wheezing.     ??? aluminum-magnesium hydroxide-simethicone 200-200-20 mg/5 mL Susp 80 mL, diphenhydrAMINE 12.5 mg/5 mL Liqd 200 mg, nystatin 100,000 unit/mL Susp 8,000,000 Units, distilled water Liqd 80 mL, lidocaine 2% viscous 2 % Soln 80 mL Take 5 mL by mouth every six (6) hours as needed. Swish, gargle, spit or swallow if throat issues 80 mL 2   ??? benzonatate (TESSALON) 200 MG capsule Take 1 capsule (200 mg total) by mouth Three (3) times a day as  needed for cough. 30 capsule 1   ??? capecitabine (XELODA) 500 MG tablet Take 1 tablet (500 mg total) by mouth two (2) times a day, continuously. 60 tablet 5   ??? clobetasoL (TEMOVATE) 0.05 % ointment Apply twice a day to rash on palm until smooth/clear. 30 g 1   ??? clonazePAM (KLONOPIN) 0.5 MG tablet Take 0.5 mg daily as needed for anxiety.  Do not exceed 0.5 mg/day. 30 tablet 0   ??? diclofenac sodium (VOLTAREN) 1 % gel Apply 2 g topically four (4) times a day as needed for arthritis or pain. 150 g 2   ??? diphenoxylate-atropine (LOMOTIL) 2.5-0.025 mg per tablet Take 1 tablet by mouth 4 (four) times a day as needed for diarrhea. 30 tablet 1   ??? dorzolamide (TRUSOPT) 2 % ophthalmic solution Administer 1 drop into the left eye Three (3) times a day. 10 mL 12   ??? empagliflozin (JARDIANCE) 10 mg tablet Take 1 tablet (10 mg total) by mouth in the morning. 30 tablet 11   ??? esomeprazole (NEXIUM) 40 MG capsule Take 1 capsule (40 mg total) by mouth daily. 90 capsule 3   ??? FARXIGA 5 mg Tab tablet Take 5 mg by mouth daily.     ??? HYDROcodone-acetaminophen (NORCO 10-325) 10-325 mg per tablet Take 1 tablet by mouth every twelve (12) hours as needed for pain. 60 tablet 0   ??? lidocaine (LIDODERM) 5 % patch Place 1 patch on the skin daily. Apply to affected area for 12 hours only each day (then remove patch) 30 patch 3   ??? lisinopriL-hydrochlorothiazide (PRINZIDE,ZESTORETIC) 20-25 mg per tablet Take 1 tablet by mouth in the morning. 90 tablet 3   ??? MAGNESIUM ORAL Take by mouth daily. OTC     ??? naloxone (NARCAN) 4 mg nasal spray One spray in either nostril once for known/suspected opioid overdose. May repeat every 2-3 minutes in alternating nostril til EMS arrives 2 each 0   ??? ondansetron (ZOFRAN) 4 MG tablet Take 1 tablet (4 mg total) by mouth every six (6) hours as needed for nausea. 30 tablet 1   ??? potassium chloride (KLOR-CON) 10 MEQ CR tablet Take 1 tablet (10 mEq total) by mouth Two (2) times a day. 180 tablet 0   ??? pregabalin (LYRICA) 200 MG capsule Take 1 capsule (200 mg total) by mouth Two (2) times a day. 60 capsule 2   ??? prochlorperazine (COMPAZINE) 10 MG tablet Take 1 tablet (10 mg total) by mouth every six (6) hours as needed for nausea. 30 tablet 6   ??? senna (SENOKOT) 8.6 mg tablet Take 3 tablets by mouth in the morning. 30 tablet 2   ??? thiamine HCl (VITAMIN B-1 ORAL) Take by mouth daily.      ??? traZODone (DESYREL) 50 MG tablet Take 1.5 tablets (75 mg total) by mouth nightly. 45 tablet 1   ??? tucatinib (TUKYSA) 150 mg tablet Take 1 tablet (150 mg total) by mouth two (2) times a day . 60 tablet 1 ??? tucatinib (TUKYSA) 50 mg tablet Take 2 tablets (100 mg total) by mouth two (2) times a day . 120 tablet 3   ??? UNABLE TO FIND Methadone film     ??? venlafaxine (EFFEXOR-XR) 150 MG 24 hr capsule Take 2 capsules (300 mg total) by mouth in the morning. 60 capsule 2     No current facility-administered medications for this visit.       Allergies:   Allergies   Allergen Reactions   ???  Adhesive Rash   ??? Decadron [Dexamethasone] Anxiety     Mania as well   ??? Suboxone [Buprenorphine-Naloxone] Hallucinations   ??? Tetracycline      Other reaction(s): Other (See Comments)   ??? Oxycodone      Night terrors   ??? Skin Protectants, Misc. Rash   ??? Tegaderm Adhesive-No Drug-Allergy Check Rash       Family History:  Cancer-related family history includes Cancer in her father, maternal grandfather, maternal uncle, maternal uncle, and paternal aunt.  She indicated that the status of her mother is unknown. She indicated that the status of her father is unknown. She indicated that the status of her brother is unknown. She indicated that the status of her maternal grandmother is unknown. She indicated that the status of her maternal grandfather is unknown. She indicated that the status of her paternal grandmother is unknown. She indicated that the status of her paternal grandfather is unknown. She indicated that the status of her maternal aunt is unknown. She indicated that the status of her paternal uncle is unknown. She indicated that the status of her neg hx is unknown. She indicated that the status of her other is unknown.           Lab Results   Component Value Date    CREATININE 0.65 09/10/2021     Lab Results   Component Value Date    ALKPHOS 214 (H) 09/10/2021    BILITOT 0.4 09/10/2021    BILIDIR 0.30 03/29/2021    PROT 7.1 09/10/2021    ALBUMIN 3.6 09/10/2021    ALT 15 09/10/2021    AST 27 09/10/2021             Vital signs for this encounter: VS reviewed in EPIC.  GEN: Awake and alert & in no acute distress  PSYCH: Alert and oriented to person, place and time. Euthymic.  HEENT: Pupils equally round without scleral icterus. No facial asymmetry.  LUNGS: No increased work of breathing.  SKIN: No rashes, petechiae or jaundice noted on visible skin  EXT: No edema noted of the lower extremities  NEURO: Normal gait and coordination.     Burtis Junes, FNP-BC, [facility]  Outpatient Oncology Palliative Care Service  [facility]  [address] [place], [place] [address]  [phone]             I personally spent 45 minutes face-to-face and non-face-to-face in the care of this patient, which includes all pre, intra, and post visit time on the date of service.

## 2021-09-24 DIAGNOSIS — G8929 Other chronic pain: Principal | ICD-10-CM

## 2021-09-24 DIAGNOSIS — M549 Dorsalgia, unspecified: Principal | ICD-10-CM

## 2021-09-24 MED ORDER — DICLOFENAC SODIUM 50 MG TABLET,DELAYED RELEASE
ORAL_TABLET | Freq: Two times a day (BID) | ORAL | 0 refills | 30 days | Status: CP | PRN
Start: 2021-09-24 — End: 2021-10-24

## 2021-09-27 ENCOUNTER — Ambulatory Visit: Admit: 2021-09-27 | Discharge: 2021-09-27 | Payer: MEDICARE

## 2021-09-27 DIAGNOSIS — C50811 Malignant neoplasm of overlapping sites of right female breast: Principal | ICD-10-CM

## 2021-09-27 DIAGNOSIS — K900 Celiac disease: Principal | ICD-10-CM

## 2021-09-27 DIAGNOSIS — C50919 Malignant neoplasm of unspecified site of unspecified female breast: Principal | ICD-10-CM

## 2021-09-27 DIAGNOSIS — R110 Nausea: Principal | ICD-10-CM

## 2021-09-27 NOTE — Unmapped (Signed)
Pt arrived to infusion center accompanied by friend, alert and oriented x 3.    Denies any side effects since last infusion.    Episode of diarrhea x 1 while at the infusion center.  Reports that happens prior to majority of infusions.     Accessed port using sterile technique.  Port flushed with NS.  No blood return. Various positions and breathing attempted without success.   alteplace instilled at 1250.  Diamond occulsive clear dressing applied to port access site.   1406- withdrew   alteplase with good blood return.  Flushed w NS in preparation for infusion   treatment plan reviewed and dose calculation using weight from today verified to be within 10% of current dose. Infusion completed without report of complication or additional side effects.    At the completion of the infusion port flushed with 10 ml normal saline using pulsatile method.  Good blood return noted. Flushed with Heparin per MD orders. Huber needle  removed. Bandaid applied to site.      Discharged ambulatory  With friend

## 2021-09-28 NOTE — Unmapped (Signed)
Patient called stating she had 2 tooth extractions today and the dentist recommended that she call her PCP to see if she needed to take antibiotics due to her immune system. Patient states that the oncologist told her she did not need an antibiotic prior to procedure but wanted to make sure about post procedure.

## 2021-10-04 NOTE — Unmapped (Signed)
[facility] Refill Coordination Note   Has 33 days left of Capecitabine and 28 days of Tukysa 150mg   Specialty Medication(s) to be Shipped:   Hematology/Oncology: Brooke [name] 50mg      Other medication(s) to be shipped: No additional medications requested for fill at this time     Brooke [name], DOB: Nov 29, 1956  Phone: [phone] (home)       All above HIPAA information was verified with patient's family member, Sister.     Was a Nurse, learning disability used for this call? No    Completed refill call assessment today to schedule patient's medication shipment from the [facility] [phone]).  All relevant notes have been reviewed.     Specialty medication(s) and dose(s) confirmed: Regimen is correct and unchanged.   Changes to medications: Brooke [name] reports no changes at this time.  Changes to insurance: No  New side effects reported not previously addressed with a pharmacist or physician: None reported  Questions for the pharmacist: No    Confirmed patient received a Conservation officer, historic buildings and a Surveyor, mining with first shipment. The patient will receive a drug information handout for each medication shipped and additional FDA Medication Guides as required.       DISEASE/MEDICATION-SPECIFIC INFORMATION        N/A    SPECIALTY MEDICATION ADHERENCE     Medication Adherence    Patient reported X missed doses in the last month: 0  Specialty Medication: capeciatbine 500mg   Patient is on additional specialty medications: Yes  Additional Specialty Medications: Brooke [name] 150mg   Patient Reported Additional Medication X Missed Doses in the Last Month: 0  Patient is on more than two specialty medications: Yes  Specialty Medication: Brooke [name] 50mg   Patient Reported Additional Medication X Missed Doses in the Last Month: 0  Informant: other relative              Were doses missed due to medication being on hold? No    Tukysa 50 mg: 7 days of medicine on hand       REFERRAL TO PHARMACIST     Referral to the pharmacist: Not needed      [facility]     Shipping address confirmed in Epic.     Delivery Scheduled: Yes, Expected medication delivery date: 10/08/21.     Medication will be delivered via Next Day Courier to the prescription address in Epic [place].    Brooke [name]   [facility] Specialty Technician

## 2021-10-06 NOTE — Unmapped (Signed)
Pharmacist Phone Follow-Up    Cancer Team  Medical Oncology: Dr. [name]  Reason for call: Oral chemotherapy management  Current treatment: Trastuzumab + tucatininb + capecitabine     Breast cancer:  Brooke [name] is a 65 yo woman with HER2+ metastatic breast cancer who started trastuzumab, tucatinib, and capecitabine on 4/18.      Brooke [name] is tolerating capecitabine and tucatinib well with no recent episodes of N/V or diarrhea.  She reports no new adverse effects.  She does report that she fell > 1 week ago.  She bruised her left knee, elbows, buttocks, and R wrist.  She did not seek care after the fall.  She has bruising and tenderness at these sites but no pain or mobility issues.  She states that she has no trouble ambulating.    Plan:  1. Continue tucatinib 250 mg BID  2. Continue capecitabine 500 mg BID continuous  3. Prochlorperazine prn nausea/vomiting  4. Imodium/Lomotil prn diarrhea  5. Next trastuzumab infusion and labs on 10/21  ________________________________________________________________________     Interval History   Brooke [name] is a 65 y.o. female with metastatic breast cancer who I am calling to follow-up on tolerance to tucatinib and capecitabine.  She reports tolerating these medications well.  She is now having regular stools and has not had any episodes of diarrhea.  She also notes that she is not having any nausea or vomiting at this time.  She has not noticed any new adverse effects.  She verbalizes taking the appropriate dose of both medications.    Brooke [name] tripped in her kitchen and fell at beginning of October.  She fell primarily on her L knee but she also hit her elbows, R wrist , and buttocks.  She did not hit her head but her kitchen clock did hit the top of her head.  She notes that she has a lot of bruising and these areas are very tender.  She is able to move all joints with no issues and does not feel that anything is broken.  She did not seek care after the fall and does not feel that she needs to be seen.  She is not having any issues with moving or ambulating.    Oral chemotherapy regimen:??   Starting 7/12:   Tucatinib 250 mg BID   ????????????????????????Capecitabine 500 mg BID, continuously (metronomic dosing to improve adherence)  ????????????????????????Trastuzumab IV q 3weeks     Starting 5/30 doses reduced to:   Tucatinib 250 mg BID (started on 6/5) --> she took 150 mg po BID (5/30-6/4) then increased on 6/5 to 250 mg after receiving 50  mg tabs from the pharmacy  ????????????????????????Capecitabine 1000 mg BID x 14 days then 7 days off  ????????????????????????Trastuzumab IV q 3weeks     Started 04/12/21:  ????????????????????????Tucatinib 300 mg po BID continuous  ????????????????????????Capecitabine 1500 mg BID x 14 days then 7 days off (stopped during first cycle d/t diarrhea)  ????????????????????????Trastuzumab IV q 3weeks  Start date: 04/12/21  Pharmacy: [facility]     Adherence: Denies missed doses    Adverse Effects:   1. Diarrhea - currently resolved, uses Imodium/Lomotil prn  2. Nausea - currently resolved, uses prochlorperazine prn    Drug interactions: Tucatinib is a strong CYP3A4 inhibitor.  It can potentially increase the concentrations of the following medications, requiring a dose reduction.  Will monitor.  1. Bupenorphine  2. Clonazepam  3. Trazodone    Patient verbalized understanding of the above information.  Breast Oncology    Breast Oncology Metrics:         Chemotherapy Dose: Dose documented     Chemotherapy Schedule: Schedule documented     NCI CTCAE: Nausea/Vomiting - None/Grade 0, Diarrhea - None/Grade 0     Comments: No interventions          I spent 15 minutes on the phone with the patient on the date of service. I spent an additional 10 minutes on pre- and post-visit activities.     The patient was physically located in [place] or a state in which I am permitted to provide care. The patient and/or parent/guardian understood that s/he may incur co-pays and cost sharing, and agreed to the telemedicine visit. The visit was reasonable and appropriate under the circumstances given the patient's presentation at the time.    The patient and/or parent/guardian has been advised of the potential risks and limitations of this mode of treatment (including, but not limited to, the absence of in-person examination) and has agreed to be treated using telemedicine. The patient's/patient's family's questions regarding telemedicine have been answered.     If the visit was completed in an ambulatory setting, the patient and/or parent/guardian has also been advised to contact their provider???s office for worsening conditions, and seek emergency medical treatment and/or call 911 if the patient deems either necessary.        Oncology History Overview Note   Identifying Statement:  Brooke [name] is a 65 y.o. female diagnosed with a right posterior frontal lobe metastasis (breast primary) status post resection 01/15/2019 and 5/5 fractions of SBRT (2500 Gy via CyberKnife) to the resection cavity.    Treatment History:  09/05/17: S/p SRS to 7 lesions  01/15/19: S/p resection, followed by SRS 2500 cGy   03/04/19: KPS 80; MRI with postsurgical changes versus residual disease; RTC in 2 weeks w/ MRI   04/15/19: KPS NA; MRI w/ SD; no study; RTC 6 weeks w/ MRI   06/10/19: KPS 80; MRI w/ SD; RTC 4 weeks  07/15/19: KPS 80; MRI w/ SD; RTC 8 weeks  08/19/19: KPS 80; start nortriptyline for HAs; RTC 4 weeks w/ MRI  09/16/19: KPS 80; con't nortriptyline for HAs; RTC 2 mos w/ MRI  11/11/19: KPS 80; MRI w/ SD though w/ small infarct; proceed w/ CVA workup; start 81mg  ASA; encouraged smoking cessation; con't nortriptyline; RTC to discuss results  11/25/19: KPS 80; discussed CVA workup results; MRA unremarkable; con't smoking cessation efforts; con't nortriptyline for HAs; con't 81mg  ASA, start Lipitor; f/u with PCP for COPD eval; RTC 2 mos w/ MRI     Malignant neoplasm of overlapping sites of right female breast (CMS-HCC)   2017 -  Presenting Symptoms    Large open wound RT breast which began as nipple inversion. Patient states that she ignored for a long time.  Physical exam of the area of concern in the RTbreast demonstrates a large open wound in the lateral right breast. Saw PCP 01/27/16 Rx Keflex.     02/04/2016 Interval Scan(s)    MMG/US: 3.1 (3.0) cm irregular spiculated dense mass in lateral Rt breast w nipple and skin retraction. 2 subcentimeter irregular masses in the RUOQ  (10', 11') (possible satellite lesions). Large Rt axillary lymph node 1.7. Lt breast clear. BIRAD 5.     02/08/2016 Biopsy    US guided Rt. Axillary LN biopsy:  Gr 3, IDC with apocrine features. ER (-), PR (31-40), HER-2 (3+). Noted to have multiple skin lesions. Poor  access to breast mass due open 10-15 cm wound risk of pain and bleeding.     02/09/2016 Initial Diagnosis    Malignant neoplasm of overlapping sites of right female breast (RAF-HCC)     02/10/2016 Interval Scan(s)    CT CAP: Large, necrotic right breast mass with wide open tract to the skin, consistent with known right breast malignancy.  Numerous enlarged right axillary, subpectoral, mediastinal, bilateral hilar lymph nodes and innumerable bilateral pulmonary nodules     02/10/2016 Interval Scan(s)    NM Bone scan: No osseous metastatic disease.     04/28/2016 - 03/10/2020 Chemotherapy    OP TRASTUZUMAB (EVERY 21 DAYS)  Trastuzumab 8 mg/kg loading then 6 mg/kg every 21 days     07/13/2016 Genetics    STRATA  ??? ERBB2 copy number alteration  Estimated copy number: 40, confidence interval: 35.7 - 44.0, cellularity: 50%  Associated FDA-approved targeted therapies in breast cancer: trastuzumab, lapatinib, ado-trastuzumab emtansine, pertuzumab  ??? PIK3CA [id number]     01/03/2020 -  Cancer Staged    CT CAP 01/03/2020 which showed a new lingular opacity measuring 1.9 cm in the right lung. There was also slight increase in the right breast mass from 1.2-> 1.5 cm.  Bone scan shows no osseous metastases. Overall I considered these findings indeterminate and ppted to repeat CT CAP in 1 month     02/03/2020 -  Cancer Staged    CT chest 02/03/20 which showed that the lingular lesion has persisted and increased in size.  No mediastinal adenopathy reported.  She continues to report a nonproductive cough and mild dyspnea.  She is a chronic smoker and currently smokes on average half a pack per day.  My major concern is that this could be a second primary lung cancer as her other sites of diseases stable on current systemic therapy       02/03/2020 Endocrine/Hormone Therapy    Stop Tamoxifen, Add Fasoldex, continue Q3week Hercpetin     03/23/2020 - 02/09/2021 Chemotherapy    OP BREAST ADO-TRASTUZUMAB EMTANSINE  ado-trastuzumab emtansine 3.6 mg/kg IV on day 1, every 21 days     04/12/2021 -  Chemotherapy    Tucatinib + Capecitabine  OP TRASTUZUMAB (EVERY 21 DAYS)  trastuzumab 8 mg/kg IV LOADING, then 6 mg/kg IV MAINT, every 21 days     Brain metastases (CMS-HCC)   08/07/2017 Initial Diagnosis    Brain metastases (CMS-HCC)    Summary of Radiosurgery   Rx:7 brain met: 09/05/2017: 2,000/2,000 cGy  Sec:R Frontal: 09/05/2017: 2,000 cGy  Sec:L Post Fr: 09/05/2017: 2,000 cGy  Sec:L Ant Fro: 09/05/2017: 2,000 cGy  Sec:R Sup Oc: 09/05/2017: 2,000 cGy  Sec:L [place]ip[place]: 09/05/2017: 2,000 cGy  Sec:R Inf Occi: 09/05/2017: 2,000 cGy  Sec:L Tempor: 09/05/2017: 2,000 cGy  Rx:Lt Med Oc: 09/13/2018: 2,000/2,000 cGy  Rx:Rt Frontal: : 2,500/2,500 cGy    01/15/2019: Craniotomy and resection of right posterior frontal lobe metastasis. Followed by [facility]    03/04/19: KPS 80; MRI with postsurgical changes versus residual disease; RTC in 2 weeks w/ MRI     01/03/2020 -  Cancer Staged    CT CAP 01/03/2020 which showed a new lingular opacity measuring 1.9 cm in the right lung. There was also slight increase in the right breast mass from 1.2-> 1.5 cm.  Bone scan shows no osseous metastases. Overall I considered these findings indeterminate and ppted to repeat CT CAP in 1 month     02/03/2020 -  Cancer Staged    CT  chest 02/03/20 which showed that the lingular lesion has persisted and increased in size.  No mediastinal adenopathy reported.  She continues to report a nonproductive cough and mild dyspnea.  She is a chronic smoker and currently smokes on average half a pack per day.  My major concern is that this could be a second primary lung cancer as her other sites of diseases stable on current systemic therapy       02/03/2020 Endocrine/Hormone Therapy    Stop Tamoxifen, Add Fasoldex, continue Q3week Hercpetin     Metastatic breast cancer (CMS-HCC)   01/25/2019 Initial Diagnosis    Metastatic breast cancer (CMS-HCC)     03/23/2020 - 02/09/2021 Chemotherapy    OP BREAST ADO-TRASTUZUMAB EMTANSINE  ado-trastuzumab emtansine 3.6 mg/kg IV on day 1, every 21 days     04/12/2021 -  Chemotherapy    Tucatinib + Capecitabine  OP TRASTUZUMAB (EVERY 21 DAYS)  trastuzumab 8 mg/kg IV LOADING, then 6 mg/kg IV MAINT, every 21 days          Pertinent Labs:   No visits with results within 1 Day(s) from this visit.   Latest known visit with results is:   Lab on 09/10/2021   Component Date Value Ref Range Status   ??? Sodium 09/10/2021 141  135 - 145 mmol/L Final   ??? Potassium 09/10/2021 3.3 (A) 3.4 - 4.8 mmol/L Final   ??? Chloride 09/10/2021 107  98 - 107 mmol/L Final   ??? CO2 09/10/2021 25.5  20.0 - 31.0 mmol/L Final   ??? Anion Gap 09/10/2021 9  5 - 14 mmol/L Final   ??? BUN 09/10/2021 <5 (A) 9 - 23 mg/dL Final   ??? Creatinine 09/10/2021 0.65  0.60 - 0.80 mg/dL Final   ??? eGFR CKD-EPI (2021) Female 09/10/2021 >90  >=60 mL/min/1.46m2 Final    eGFR calculated with CKD-EPI 2021 equation in accordance with [employer] and [employer] of Nephrology Task Force recommendations.   ??? Glucose 09/10/2021 97  70 - 179 mg/dL Final   ??? Calcium 16/09/9603 8.9  8.7 - 10.4 mg/dL Final   ??? Albumin 54/08/8118 3.6  3.4 - 5.0 g/dL Final   ??? Total Protein 09/10/2021 7.1  5.7 - 8.2 g/dL Final   ??? Total Bilirubin 09/10/2021 0.4  0.3 - 1.2 mg/dL Final   ??? AST 14/78/2956 27  <=34 U/L Final ??? ALT 09/10/2021 15  10 - 49 U/L Final   ??? Alkaline Phosphatase 09/10/2021 214 (A) 46 - 116 U/L Final   ??? WBC 09/10/2021 8.1  3.6 - 11.2 10*9/L Final   ??? RBC 09/10/2021 3.88 (A) 3.95 - 5.13 10*12/L Final   ??? HGB 09/10/2021 13.8  11.3 - 14.9 g/dL Final   ??? HCT 21/30/8657 40.0  34.0 - 44.0 % Final   ??? MCV 09/10/2021 103.3 (A) 77.6 - 95.7 fL Final   ??? MCH 09/10/2021 35.6 (A) 25.9 - 32.4 pg Final   ??? MCHC 09/10/2021 34.5  32.0 - 36.0 g/dL Final   ??? RDW 84/69/6295 18.6 (A) 12.2 - 15.2 % Final   ??? MPV 09/10/2021 7.2  6.8 - 10.7 fL Final   ??? Platelet 09/10/2021 190  150 - 450 10*9/L Final   ??? nRBC 09/10/2021 0  <=4 /100 WBCs Final   ??? Neutrophils % 09/10/2021 72.6  % Final   ??? Lymphocytes % 09/10/2021 17.9  % Final   ??? Monocytes % 09/10/2021 8.8  % Final   ??? Eosinophils % 09/10/2021 0.4  %  Final   ??? Basophils % 09/10/2021 0.3  % Final   ??? Absolute Neutrophils 09/10/2021 5.9  1.8 - 7.8 10*9/L Final   ??? Absolute Lymphocytes 09/10/2021 1.4  1.1 - 3.6 10*9/L Final   ??? Absolute Monocytes 09/10/2021 0.7  0.3 - 0.8 10*9/L Final   ??? Absolute Eosinophils 09/10/2021 0.0  0.0 - 0.5 10*9/L Final   ??? Absolute Basophils 09/10/2021 0.0  0.0 - 0.1 10*9/L Final   ??? Anisocytosis 09/10/2021 Moderate (A) Not Present Final       Current medications:     Current Outpatient Medications   Medication Sig Dispense Refill   ??? albuterol HFA 90 mcg/actuation inhaler Inhale 2 puffs every six (6) hours as needed for wheezing.     ??? aluminum-magnesium hydroxide-simethicone 200-200-20 mg/5 mL Susp 80 mL, diphenhydrAMINE 12.5 mg/5 mL Liqd 200 mg, nystatin 100,000 unit/mL Susp 8,000,000 Units, distilled water Liqd 80 mL, lidocaine 2% viscous 2 % Soln 80 mL Take 5 mL by mouth every six (6) hours as needed. Swish, gargle, spit or swallow if throat issues 80 mL 2   ??? benzonatate (TESSALON) 200 MG capsule Take 1 capsule (200 mg total) by mouth Three (3) times a day as needed for cough. 30 capsule 1   ??? capecitabine (XELODA) 500 MG tablet Take 1 tablet (500 mg total) by mouth two (2) times a day, continuously. 60 tablet 5   ??? clobetasoL (TEMOVATE) 0.05 % ointment Apply twice a day to rash on palm until smooth/clear. 30 g 1   ??? clonazePAM (KLONOPIN) 0.5 MG tablet Take 0.5 mg daily as needed for anxiety.  Do not exceed 0.5 mg/day. 30 tablet 0   ??? diclofenac (VOLTAREN) 50 MG EC tablet Take 1 tablet (50 mg total) by mouth two (2) times a day as needed. With food 60 tablet 0   ??? diclofenac sodium (VOLTAREN) 1 % gel Apply 2 g topically four (4) times a day as needed for arthritis or pain. 150 g 2   ??? diphenoxylate-atropine (LOMOTIL) 2.5-0.025 mg per tablet Take 1 tablet by mouth 4 (four) times a day as needed for diarrhea. 30 tablet 1   ??? dorzolamide (TRUSOPT) 2 % ophthalmic solution Administer 1 drop into the left eye Three (3) times a day. 10 mL 12   ??? empagliflozin (JARDIANCE) 10 mg tablet Take 1 tablet (10 mg total) by mouth in the morning. 30 tablet 11   ??? esomeprazole (NEXIUM) 40 MG capsule Take 1 capsule (40 mg total) by mouth daily. 90 capsule 3   ??? FARXIGA 5 mg Tab tablet Take 5 mg by mouth daily.     ??? HYDROcodone-acetaminophen (NORCO 10-325) 10-325 mg per tablet Take 1 tablet by mouth every twelve (12) hours as needed for pain. 60 tablet 0   ??? lidocaine (LIDODERM) 5 % patch Place 1 patch on the skin daily. Apply to affected area for 12 hours only each day (then remove patch) 30 patch 3   ??? lisinopriL-hydrochlorothiazide (PRINZIDE,ZESTORETIC) 20-25 mg per tablet Take 1 tablet by mouth in the morning. 90 tablet 3   ??? MAGNESIUM ORAL Take by mouth daily. OTC     ??? naloxone (NARCAN) 4 mg nasal spray One spray in either nostril once for known/suspected opioid overdose. May repeat every 2-3 minutes in alternating nostril til EMS arrives 2 each 0   ??? ondansetron (ZOFRAN) 4 MG tablet Take 1 tablet (4 mg total) by mouth every six (6) hours as needed for nausea. 30 tablet 1   ???  potassium chloride (KLOR-CON) 10 MEQ CR tablet Take 1 tablet (10 mEq total) by mouth Two (2) times a day. 180 tablet 0   ??? pregabalin (LYRICA) 200 MG capsule Take 1 capsule (200 mg total) by mouth Two (2) times a day. 60 capsule 2   ??? prochlorperazine (COMPAZINE) 10 MG tablet Take 1 tablet (10 mg total) by mouth every six (6) hours as needed for nausea. 30 tablet 6   ??? senna (SENOKOT) 8.6 mg tablet Take 3 tablets by mouth in the morning. 30 tablet 2   ??? thiamine HCl (VITAMIN B-1 ORAL) Take by mouth daily.      ??? traZODone (DESYREL) 50 MG tablet Take 1.5 tablets (75 mg total) by mouth nightly. 45 tablet 1   ??? tucatinib (TUKYSA) 150 mg tablet Take 1 tablet (150 mg total) by mouth two (2) times a day . 60 tablet 1   ??? tucatinib (TUKYSA) 50 mg tablet Take 2 tablets (100 mg total) by mouth two (2) times a day . 120 tablet 3   ??? UNABLE TO FIND Methadone film     ??? venlafaxine (EFFEXOR-XR) 150 MG 24 hr capsule Take 2 capsules (300 mg total) by mouth in the morning. 60 capsule 2     No current facility-administered medications for this visit.

## 2021-10-07 MED FILL — TUKYSA 50 MG TABLET: ORAL | 30 days supply | Qty: 120 | Fill #1

## 2021-10-15 ENCOUNTER — Ambulatory Visit: Admit: 2021-10-15 | Discharge: 2021-10-16 | Payer: MEDICARE

## 2021-10-15 DIAGNOSIS — C50811 Malignant neoplasm of overlapping sites of right female breast: Principal | ICD-10-CM

## 2021-10-15 DIAGNOSIS — R110 Nausea: Principal | ICD-10-CM

## 2021-10-15 DIAGNOSIS — K900 Celiac disease: Principal | ICD-10-CM

## 2021-10-15 DIAGNOSIS — C50919 Malignant neoplasm of unspecified site of unspecified female breast: Principal | ICD-10-CM

## 2021-10-15 MED ADMIN — sodium chloride (NS) 0.9 % infusion: 100 mL/h | INTRAVENOUS | @ 18:00:00 | Stop: 2021-10-15

## 2021-10-15 MED ADMIN — heparin, porcine (PF) 100 unit/mL injection 500 Units: 500 [IU] | INTRAVENOUS | @ 18:00:00 | Stop: 2021-10-15

## 2021-10-15 MED ADMIN — trastuzumab (HERCEPTIN) 340 mg in sodium chloride (NS) 0.9 % 250 mL IVPB: 6 mg/kg | INTRAVENOUS | @ 18:00:00 | Stop: 2021-10-15

## 2021-10-15 NOTE — Unmapped (Deleted)
Patient arrived in the infusion clinic at 1230. Weight and vitals were obtained. Port accessed, flushed with blood return, dressing clean, dry and intact. No labs or premeds indicated in treatment plan. Patient was administered chemotherapy regimen as ordered without complications while in the clinic. Pt port heparin flushed and deaccessed, covered with 2x2 gauze and bandaid. Patient given after visit summary then discharged home to self care.

## 2021-10-15 NOTE — Unmapped (Signed)
Patient arrived in the infusion clinic at 12:45pm.  Weight and Vitals were obtained. Port was accessed and flushed with blood return, dressing clean, dry, and intact. No premeds or labs ordered for this encounter.  Patient was administered chemotherapy regiment as ordered without complications while in the clinic.  Port was heparin flushed and de-accessed, covered with 2x2 gauze and bandaid. Patient declined after visit summary then discharged home to self care.

## 2021-10-16 NOTE — Unmapped (Signed)
[facility]  -----------------------------------------------------------------------------------------------------  Follow-up Outpatient Evaluation    PCP: [name], MD     Consulting Physicians: Surgical oncology: Tyson Alias M.D.     Reason for Visit: Here for management of breast cancer.  -----------------------------------------------------------------------------------------------------  I personally spent 40 minutes face-to-face and non-face-to-face in the care of this patient, which includes all pre, intra, and post visit time on the date of service.  -----------------------------------------------------------------------------------------------------  Assessment:  Brooke [name] is a 65 y.o. 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 She was switch d/t abnormal LFT's (from ETOH/NASH cirrhosis) on TDM1 (not progression).     Brooke [name] presents today for pretreatment evaluation on Xeloda, tucatinib + Herceptin. She received Herceptin infusion 10/15/21 and next infusion is scheduled 11/05/21. By my exam today she has no local control issues and has no symptoms or signs suggestive of progressive disease.  She is tolerating therapy well with generally expected and manageable toxicities.  Her labs today showed hypokalemia (K 2.9). I prescribed oral K 10 mEq to take BID. She will get her next MRI brain 11/17/21 with Dr. Imogene Burn and will get repeat CT C/A/P + NM BS and follow-up in the transition clinic with Dr. [name]. She will RTC in 3 months for provider visit + labs + scan review. She will reach out in the interim if she has any questions or concerns.     1. Metastatic breast cancer, ER-, PR+, HER-2 positive to lung, br  A. Systemic therapy    First-line: THP. Taxol d/c for G3 neuropathy.  Pertuzumab d/c for G3 diarrhea.  Not progression.   Second line: Trastuzumab + AI -->Tamoxifen 2/2 arthralgias.   Third line: Rebiopsy ER -/PR 10%/HER-2 +ve. C1 TDM1 03/23/20 limited by neuropathy. 09/01/20: DR TDM1 to 2.4 mg/kg. Discontinue TDM1 d/t abnormal LFT's and not progression.   Fourth line:  Herceptin + tucatinib + capecitabine (Xeloda). 05/20/21 DR capecitabine to 1000 mg BID, and tucatinib to 250 mg daily d/t diarrhea. D/t issues with adherence, her capecitabine dose was changed to 500 mg po BID on a continuous schedule on 07/06/21.  Subsequent lines:  Consider return to TDM1 or Enhertu on progression predicated on LFTs.    B.  Systemic imaging.   -- CT C/A/P 8/16/2: stable disease. Interval reduction in size of left lower lobe mass. NM BS 08/10/21: No evidence of osseous metastatic disease.    C.  Brain mets/neuroimaging   Surgery: 01/15/19: resection of the right frontal met. [+++] MBC.  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   07/14/21: MRI brain: SD.  Repeat MRI 11/17/21     D.  Molecular  Genomics:  STRATA: ERBB2, PIK3CA.  Not eligible for HARMONY.  Genetic: Referral submitted in 03/2020, patient was never reached.   Tumor Markers: stable    E. Radiation:   09/05/17: CK to 7 brain lesions. 09/13/18: CK (1#) right 9 mm lesion. 2/28 - 02/28/2019: CK 25 Gy in 5# to left frontal resection bed.     F. 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 Barbette Merino who thought not likely consequential but recommended CV cards eval.   > F/u with Dr. Harland German.   > Echo 12/30/20: EF 60-65%.     2.  Comorbidities/supportive care   > Dysphagia/GERD/hemoglobin drop: Continue Nexium/famotidine.  TTG  positive.  Given stable Hgb will hold on endoscopy for now.  F/u with Dr. Randye Lobo.  Gluten-free diet. Improved.   > Depression/Anxiety: Venlafaxine 225 mg. C/w clonazepam. F/u psychiatry/[facility] (Dr. Vertell Limber). Will get PAM  > Peripheral neuropathy: Venlafaxine 225 mg. C/w Lyrica dose 200 mg BID.  Paraneoplastic panels unremarkable. Dose reduced TDM1.  Did not tolerate methadone due to sedation/dizziness. Follows with palliative care. Can refer back to neurology to consider TENS versus scrambler therapy.   > Smoking cessation: Previously encouraged to stop smoking. 1 ppd currently. Previously prescribed Chantix.   > Insomnia: Continue trazodone 75mg .  No longer taking Remeron.   > Arthralgias: C-spine x-ray showed DJD.  Muscle relaxant by PCP. Referred to PT for back previously. Currently receiving neck PT.  If no improvement refer back to Dr. Glenice Laine. Consider PCI. Nondisplaced right hip fracture: Has morphine patches for mgmt.   > Wheezing:  Previous recommendations made to her to reschedule pulm appointment; recommended by PCP for COPD eval, and to again  consider reducing smoking.  Following with pulmonary, Dr. Wellington Hampshire, MD. NM lung scan 03/2021 WNL. Thought to be cardiology related.    > Cirrhosis. Abnormal LFTs. Follows with GI, Dr. Raford Pitcher. No more alcohol. Will recheck LFTs today.    > Iron deficiency/fatigue: Calculated iron deficit 1.5 g.  IV iron 12/11/20 and 01/19/21. Thalassemia testing 12/29/2020 does not show hereditary link.    >Scapulothoracic bursitis: Consider steroid injections. Referred to Dr. Glenice Laine.    >Health Maintenance: COVID-19 vaccinated. Had booster.   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 Applying foot and heel lotion. Improved  > Constipation: Continue taking senna. Recommended prune juice.   > Memory loss/brain fog: continues. MRI shows SD.     3.  Follow-up   -- Continue Tucatinib + IV Trastuzumab+ Xeloda  -- Get MRI 11/17/21  -- Repeat CT CAP + NM BS prior to next visit  -- RTC in 3 months for provider visit + labs   -----------------------------------------------------------------------------------------------------  Interval history:  Brooke [name] is a 65 y.o. F who presents today accompanied by her sister, Misty Stanley, and friend, [name], for follow up on Xeloda, Tucatinib, Herceptin.   -- Doing well overall  -- HA 2/2 stress. Unchanged.   -- Remains compliant on Xeloda  -- Diarrhea resolved  -- Nausea this morning, but has not had anything to eat.   -- Neuropathy to toes unchanged.   -- No new breast related complaints  -- Full 12 ROS reviewed and otherwise mild/none.    Social history update: Sister Lisa's granddaughter recently passed away and her grandson was diagnosed with brain cancer. Has a boyfriend, [name], She has a son, [name]. She has another sister, [name], who recently had a new great-grandchild.     Review of Systems: A complete twelve systems review was obtained and is positive per the HPI but otherwise negative or unchanged. See MIMS #1170 where available.    ONC HISTORY  Metastatic HR + HER-2 overexpressing breast cancer to lung and brain.  First-line and second line: Trastuzumab + AI -->Tamoxifen d/t arthralgias.  3rd line:  TDM1.  4th line: capecitabine (xeloda), tucatinib, herceptin    Consider [id number], Enhertu on progression.     Radiation: 09/05/17: CK to 7 brain lesions. 09/13/18: CK (1#) right 9 mm lesion. 2/28 - 02/28/2019: CK 25 Gy in 5# to left frontal resection bed.  Surgery: 01/15/19: resection of the right frontal met.  Genomic: STRATA: ERBB2, PIK3CA.  Not eligible for HARMONY.    2017 or  earlier:  R breast wound and nipple inversion which patient describes herself as ignoring for some time    02/04/16:  Mammogram R breast showing 3.1 cm irregular spiculated dense mass in lateral Rt breast w nipple and skin retraction. 2 subcentimeter irregular masses in the RUOQ  (10', 11') (possible satellite lesions). Large Rt axillary lymph node 1.7. Lt breast clear. BIRAD 5.    02/08/16:  Core biopsy R axillary LN (breast bx deferred due to open wound) Gr 3, IDC with apocrine features. ER (-), PR (31-40), HER-2 (3+). Noted to have multiple skin lesions.    02/10/16:  Initial staging with CT CAP: Large, necrotic right breast mass with wide open tract to the skin, consistent with known right breast malignancy.  Numerous enlarged right axillary, subpectoral, mediastinal, bilateral hilar lymph nodes and innumerable bilateral pulmonary nodules.  Bone scan negative for osseous mets.    --Started trastuzumab initially with THP.  Taxol d/c after three cycles for G3 neuropathy.  Pertuzumab d/c for G3 diarrhea.  Not discontinued due to progression.    04/28/16:  Switched to anastrozole/trastuzumab  --patient had trouble tolerating AIs, switched to exemestane    07/13/16: genomic testing with STRATA showing ERBB2 copy # alteration, PIK3CA p.[id number]    08/01/17:  MRI showed multiple small brain metastases   --completed CK to 7 lesions on 09/05/17.   --CK to the right 9 mm lesion (1 fx on 09/13/18).    --continued on AI/trastuzumab    11/2018:  progression of her frontal lobe lesion.   --resection of the right frontal mass by Dr. Tresa Garter on 01/15/2019.  Pathology was consistent with metastatic breast cancer.  --2/28 - 02/28/2019: CK 25 Gy in 5# to left frontal resection bed     04/2019: switched to tamoxifen/trastuzmab due to intolerance of AIs (joint pain    09/2019:  Concern for possible slight extra-cranial progression in LLL lung mass, elected to continue on current tx    01/03/20:  Repeat CT with new lingular 1.9 cm nodular opacity; differential diagnosis includes new metastatic lesion and consolidative/infectious opacity. CT chest 1 month is recommended to help discriminate between these two etiologies.  Also slight increase R breast mass.    02/03/20:  Repeat CT again with lingular 1.9 cm nodule unchanged to slightly increased in size, left lower lobe 3.5 cm mass slightly increased in size. Discussed at [facility] due to concern that lingular mass might represent lung primary in current smoker. Recommended biopsy.      01/2020:  Changed to fulvestrant/trastuzumab due to progression    03/05/20:  FNA lung biopsy showing carcinoma c/w breast primary (GATA3+, TTF1-), ER neg/PR 10%/HER2 3+.  Switched to [employer]. C1D1 03/23/20.    03/29/21: Switched to capecitabine (xeloda), tucatinib, herceptin d/t abnormal LFT's.      Onc Family History:   - Father: prostate  - Mat grandfather: prostate  - Mat uncle: lung  - Pat aunt: breast   - Great niece: adrenal     Social History:   Social History     Social History Narrative    She lives in senior housing in [place] x2 months. he has 4 sisters, 2 nearby and involved in care. She has 1 son-[name], recently married 48 yo, who lives about an hour and a half away in [place]. He works full-time as a Sales executive. She is still a chronic smoker (1ppd). She is currently divorced.         Goes to [employer] in [place]  and gets a lot of support. Her church friend [name] and her sisters goes with her to chemo appts.     Physical Examination:   Vital Signs: BP 135/71  - Pulse 72  - Temp 36.3 ??C (97.4 ??F) (Temporal)  - Resp 18  - Wt 54.7 kg (120 lb 9.6 oz)  - LMP  (LMP Unknown)  - SpO2 98%  - BMI 22.06 kg/m??  See flow sheet  General: Chronically ill appearing female in no acute distress.    HEENT: Well-healed (R) frontal scalp scar. EOMI. Sclerae anicteric.   Neck: Supple. No cervical or supraclavicular adenopathy.   Pulm: Lungs clear to auscultation bilat; breathing non-labored. Mild coarse breath sounds.   Cardiac: Regular rate and rhythm; no LE edema.    Abdominal: Mild fullness to RUQ, over liver.   Musculoskeletal: Tenderness to b/l lower scapula.    Breasts/chest: (R) breast: Nipple autolyzed. Small ulcer laterally to redeveloping nipple closed. (L) breast exam: Port to the upper left chest.  Neurologic:  Alert and oriented. Grossly non-focal  Lymphatic: No cervical or supraclavicular adenopathy. No palpable right axillary adenopathy.  Skin: Normal.   Extremity: Callus on right foot.     DATA REVIEW:    Pertinent labs/imaging/pathology reviewed in detail.    Scribe Statement: Documentation assistance was provided by me personally, Marcy Salvo, a scribe. Olivia Mackie, MD obtained and performed the history, physical exam and medical decision making elements that were entered into the chart. Signed by Marcy Salvo, 10/19/21 at 2:15 PM    Provider Attestation: Documentation assistance was provided by the Scribe, Marcy Salvo. I was present during the time the encounter was recorded. The information recorded by the Scribe was done at my direction and has been reviewed and validated by me. Signed by Olivia Mackie, MD 10/19/21 at 2:35 PM

## 2021-10-19 ENCOUNTER — Ambulatory Visit
Admit: 2021-10-19 | Discharge: 2021-10-20 | Payer: MEDICARE | Attending: Geriatric Medicine | Primary: Geriatric Medicine

## 2021-10-19 ENCOUNTER — Telehealth: Admit: 2021-10-19 | Discharge: 2021-10-20 | Payer: MEDICARE

## 2021-10-19 ENCOUNTER — Institutional Professional Consult (permissible substitution): Admit: 2021-10-19 | Discharge: 2021-10-20 | Payer: MEDICARE

## 2021-10-19 DIAGNOSIS — C50919 Malignant neoplasm of unspecified site of unspecified female breast: Principal | ICD-10-CM

## 2021-10-19 DIAGNOSIS — R110 Nausea: Principal | ICD-10-CM

## 2021-10-19 DIAGNOSIS — Z171 Estrogen receptor negative status [ER-]: Principal | ICD-10-CM

## 2021-10-19 DIAGNOSIS — C50811 Malignant neoplasm of overlapping sites of right female breast: Principal | ICD-10-CM

## 2021-10-19 DIAGNOSIS — Z515 Encounter for palliative care: Principal | ICD-10-CM

## 2021-10-19 DIAGNOSIS — E876 Hypokalemia: Principal | ICD-10-CM

## 2021-10-19 DIAGNOSIS — G629 Polyneuropathy, unspecified: Principal | ICD-10-CM

## 2021-10-19 DIAGNOSIS — K900 Celiac disease: Principal | ICD-10-CM

## 2021-10-19 LAB — COMPREHENSIVE METABOLIC PANEL
ALBUMIN: 3.7 g/dL (ref 3.4–5.0)
ALKALINE PHOSPHATASE: 212 U/L — ABNORMAL HIGH (ref 46–116)
ALT (SGPT): 21 U/L (ref 10–49)
ANION GAP: 10 mmol/L (ref 5–14)
AST (SGOT): 39 U/L — ABNORMAL HIGH (ref <=34)
BILIRUBIN TOTAL: 0.5 mg/dL (ref 0.3–1.2)
BLOOD UREA NITROGEN: 7 mg/dL — ABNORMAL LOW (ref 9–23)
BUN / CREAT RATIO: 11
CALCIUM: 8.7 mg/dL (ref 8.7–10.4)
CHLORIDE: 108 mmol/L — ABNORMAL HIGH (ref 98–107)
CO2: 24 mmol/L (ref 20.0–31.0)
CREATININE: 0.62 mg/dL
EGFR CKD-EPI (2021) FEMALE: >90 mL/min/{1.73_m2} (ref >=60)
GLUCOSE RANDOM: 123 mg/dL (ref 70–179)
POTASSIUM: 2.9 mmol/L — ABNORMAL LOW (ref 3.4–4.8)
PROTEIN TOTAL: 6.9 g/dL (ref 5.7–8.2)
SODIUM: 142 mmol/L (ref 135–145)

## 2021-10-19 LAB — CBC W/ AUTO DIFF
BASOPHILS ABSOLUTE COUNT: 0 10*9/L (ref 0.0–0.1)
BASOPHILS RELATIVE PERCENT: 0.4 %
EOSINOPHILS ABSOLUTE COUNT: 0.1 10*9/L (ref 0.0–0.5)
EOSINOPHILS RELATIVE PERCENT: 1.2 %
HEMATOCRIT: 38.6 % (ref 34.0–44.0)
HEMOGLOBIN: 13.3 g/dL (ref 11.3–14.9)
LYMPHOCYTES ABSOLUTE COUNT: 2 10*9/L (ref 1.1–3.6)
LYMPHOCYTES RELATIVE PERCENT: 37.5 %
MEAN CORPUSCULAR HEMOGLOBIN CONC: 34.4 g/dL (ref 32.0–36.0)
MEAN CORPUSCULAR HEMOGLOBIN: 36.3 pg — ABNORMAL HIGH (ref 25.9–32.4)
MEAN CORPUSCULAR VOLUME: 105.5 fL — ABNORMAL HIGH (ref 77.6–95.7)
MEAN PLATELET VOLUME: 7.4 fL (ref 6.8–10.7)
MONOCYTES ABSOLUTE COUNT: 0.5 10*9/L (ref 0.3–0.8)
MONOCYTES RELATIVE PERCENT: 8.5 %
NEUTROPHILS ABSOLUTE COUNT: 2.8 10*9/L (ref 1.8–7.8)
NEUTROPHILS RELATIVE PERCENT: 52.4 %
NUCLEATED RED BLOOD CELLS: 0 /100{WBCs} (ref <=4)
PLATELET COUNT: 162 10*9/L (ref 150–450)
RED BLOOD CELL COUNT: 3.66 10*12/L — ABNORMAL LOW (ref 3.95–5.13)
RED CELL DISTRIBUTION WIDTH: 18.5 % — ABNORMAL HIGH (ref 12.2–15.2)
WBC ADJUSTED: 5.3 10*9/L (ref 3.6–11.2)

## 2021-10-19 MED ORDER — POTASSIUM CHLORIDE ER 10 MEQ TABLET,EXTENDED RELEASE
ORAL_TABLET | Freq: Two times a day (BID) | ORAL | 0 refills | 90 days | Status: CP
Start: 2021-10-19 — End: 2022-01-17

## 2021-10-19 NOTE — Unmapped (Signed)
It was a pleasure to see you today in the Medical Oncology Clinic.  Please call our nurse navigator, Modena Jansky, if you have any interval questions or concerns:     For appointments, please call [phone]     Nurse Navigator:   Modena Jansky, RN: phone: [phone]     Prescription refills: [phone]     FAX: [phone]     For emergencies, evenings or weekends, please call [phone] and ask for the oncology fellow on call.     Reasons to call emergency line may include:   Fever of 100.5 or greater   Nausea and/or vomiting not relieved with nausea medicine   Diarrhea or constipation not relieved with bowel regimen   Severe pain not relieved with usual pain regimen     Labs from today:  Lab Results   Component Value Date    WBC 5.3 10/19/2021    HGB 13.3 10/19/2021    HCT 38.6 10/19/2021    MCV 105.5 (H) 10/19/2021    PLT 162 10/19/2021       Lab Results   Component Value Date    NEUTROABS 2.8 10/19/2021         Chemistry        Component Value Date/Time    NA 142 10/19/2021 1257    K 2.9 (L) 10/19/2021 1257    CL 108 (H) 10/19/2021 1257    CO2 24.0 10/19/2021 1257    BUN 7 (L) 10/19/2021 1257    CREATININE 0.62 10/19/2021 1257    CREATININE 0.8 01/28/2021 1508    GLU 123 10/19/2021 1257        Component Value Date/Time    CALCIUM 8.7 10/19/2021 1257    ALKPHOS 212 (H) 10/19/2021 1257    AST 39 (H) 10/19/2021 1257    ALT 21 10/19/2021 1257    BILITOT 0.5 10/19/2021 1257            Lab Results   Component Value Date    ALT 21 10/19/2021    AST 39 (H) 10/19/2021    ALKPHOS 212 (H) 10/19/2021    BILITOT 0.5 10/19/2021       Future Appointments   Date Time Provider Department Center   11/05/2021  1:00 PM [facility]   11/11/2021 11:20 AM [name], MD [facility]   11/17/2021  8:45 AM [facility] MRI RM 1 [facility] [place] [facility]   11/17/2021 11:00 AM Colette Buford Dresser, MD [facility]   11/17/2021 12:00 PM Burnis Kingfisher, FNP [id number]

## 2021-10-19 NOTE — Unmapped (Signed)
OUTPATIENT ONCOLOGY PALLIATIVE CARE    Principal Diagnosis: Ms. Carvell is a 65 y.o. female with metastatic breast cancer,  diagnosed in 2017.  Disease sites include lung and brain.     Assessment/Plan:   1.Neuropathic pain in hands and feet-stable and continued mid upper back pain radiating to lower back appears to be arthritic-controlled with current regimen.  Hx of cervical thoracic paraspinal trigger point bilateral this past July 2022 with not as much relief as she has had in the past.     -Continue lyrica 200 mg bid dosing-relief with this.  -Continue hydrocodone 10/325 mg 1 tab 3 times a day as needed for pain-averaging only 1 tablet every 2 to 3 days  -Continue Effexor 225 mg every day  -Continue diclofenac gel as needed        Opioid use in past:  -Had difficulty tolerating the methadone 2.5 mg dosing due to sedation.  Did not try the 1 mg methadone, so could consider this in the future if needed for the neuropathic discomfort.  Some reluctance in re-starting methadone.  -No relief with tramadol  -oxycodone-night terrors.  -Suboxone-hallucinations    2.  Support-acknowledging that it will be difficult to no longer have Dr. [name] as her medical oncologist.  [name], fondly shared the intense relationship that the 2 of them have had over the last 6 years and when they first met how Nicole was close to dying and responded well with the interventions by Dr. [name].  Bradi deeply hopes that she gets a provider with experience.  Endorsing some anxiety with Dr. Jaclyn Shaggy departure.  -Provided empathetic listening.  Acknowledged the loss for both [name] and Dr. [name].  [name] insightful to share that her strong spiritual practice will get her through and also sharing that its difficult to leave for Dr. Kerry Hough.  -Highly encouraged to finish writing the card that Eisha has for Dr. [name].  Shared that it can be a therapeutic way to process her emotions and also a way to share with Dr. [name] what their relationship is meant to each other.    [name] sharing that functional not feeling the need for in care support at the moment.       3.  Goals of care-not addressed today.     At prior visits: goals which are to decrease pain and to maintain her independence. Lives alone and [name] lives close by.     Wants to ensure that her quality of life remains high and the wish to keep on going.  Shared concern that she does not want to be in pain.  NP provided reassurance that our team can offer her different treatment strategies to help her with her goal.     Advance care planning-see advance care planning note dated 01/31/2020.  -at prior visits: Patient shared that she has been dreaming about death. She does have her funeral arrangements completed. Shared that she has her wishes written down and her Sister [name] knows where they are regarding her funeral. She still contemplating whether to be cremated or buried and she is leaning toward cremation. patient currently focused on cancer directed therapy.  Prefers to stay in the moment and not discussed things.  We will continue to support and address if she has a change in clinical condition.        -Advance directives scanned in on 11/02/19        HCDM [facility]): Elon Alas Sister - [phone]    HCDM, [facility]: [name] -  Sister - [phone]    At prior visits,   # Controlled substances risk management.  We are not currently prescribing controlled medications for her.   - Patient has a signed pain medication agreement with Outpt Palliative care, completed on 11/01/18, as per standard care. This was signed again on 01/28/19   - [place] database was reviewed today and it was appropriate.   - Urine drug screen was not performed at this visit. Findings: not applicable.   - Patient has received information about safe storage and administration of medications.   - Patient has received a prescription for narcan and shared with [name] and pt.       F/u: 11/23 video    ----------------------------------------  Referring Provider: From inpatient oncology team  Oncology Team: Breast team-Dr. [name]  PCP: [name], MD      HPI: 65 year old woman with her to overexpressing metastatic breast cancer to her lung and brain.  Was found to have multiple small brain metastases and completed CyberKnife therapy on September 11. Describes ongoing pain in her pelvis, rates it as a 3 out of 10 today.  Has been improving over the last week or 2.  Feels like gabapentin has been helpful, and is also responded well to Tylenol and ibuprofen in the past.  She is taken oxycodone and it gave her night terrors does not want to take it again.  Has taken Dilaudid previously and did not have side effects from this.    Current cancer-directed therapy: capecitabine (Xeloda), tucatinib, and trastuzumab (Herceptin).     Diagnosis of COVID (07/18/21)      Interval hx 10/19/21 NP and [name]      Symptom Review:  General: doing ok, primary issue is the anxiety of knowing today will be the last day with Dr. [name].  Physically she is doing okay.  Experiencing some sensory deficits with fingertips, but overall neuropathic pain is controlled in hands and feet  Pain: Neck and back discomfort stable with hydrocodone 1 tablet every 2 to 3 days.  Fatigue: Fair  Mobility: Did have a fall last week with some trauma to her knee, mild tenderness now.  She was making bacon and tripped over the [employer] bag that was in the kitchen.  Appetite: feels that the taste buds messed up from COVID.  Smells sweet scents that are not in her kitchen.  Overall weight is stable.  Bowel function: Improved.  Did have mild diarrhea last week from infusion, but really no issues with diarrhea now  Mood: See support and assessment and plan.        Palliative Performance Scale: 70% - Ambulation: Reduced / unable to do normal work, some evidence of disease / Self-Care: Full / Intake: Normal or reduced / Level of Conscious: Full         Coping/Support Issues: Patient reports she is been able to attend church functions and is finding this very helpful. Has boyfriend [name] for the last 15 years.     At prior visits, patient did share that she continues to receive much help from her church friend, [name] and her husband.  They prayed together and she is found this supportive.  She states that her 2 sisters have been supportive, but she feels that they are becoming more less patient with her due to patient's irritability.     Overall coping well, no specific issues identified.  Finding support with her church community, her sister, [name], and prayer.  Social History: Pt lives in senior housing in [place], says it's ok and quiet but she is from [place] and liked it there more. She has 4 sisters, 2 nearby and involved in care. She has 1 son-[name], 35yo, who lives about an hour and a half away in [place]. She is divorced.  ??  Goes to [employer] in [place] and gets a lot of support. Her church friend [name] goes with her to chemo appts. Her sisters will go too.    Advance Care Planning: Advance directives scanned into system  HCPOA: See ACP note  Living Will: See ACP note  ACP note: Yes, advance directive scanned in on November 6    Objective     Opioid Risk Tool:      Opioid Risk Tool:   Female  Female    Family history of substance abuse      Alcohol   1  3    Illegal drugs  2  3    Rx drugs  4  4    Personal history of substance abuse      Alcohol  3  3    Illegal drugs  4  4    Rx drugs  5  5    Age between 16--45 years  1  1    History of preadolescent sexual abuse  3 0    Psychological disease      ADD, OCD, bipolar, schizophrenia  2  2    Depression  1  1    Total: 7  (<3 low risk, 4-7 moderate risk, >8 high risk)      Oncology History Overview Note   Identifying Statement:  Ivonne Freeburg is a 65 y.o. female diagnosed with a right posterior frontal lobe metastasis (breast primary) status post resection 01/15/2019 and 5/5 fractions of SBRT (2500 Gy via CyberKnife) to the resection cavity.    Treatment History:  09/05/17: S/p SRS to 7 lesions  01/15/19: S/p resection, followed by SRS 2500 cGy   03/04/19: KPS 80; MRI with postsurgical changes versus residual disease; RTC in 2 weeks w/ MRI   04/15/19: KPS NA; MRI w/ SD; no study; RTC 6 weeks w/ MRI   06/10/19: KPS 80; MRI w/ SD; RTC 4 weeks  07/15/19: KPS 80; MRI w/ SD; RTC 8 weeks  08/19/19: KPS 80; start nortriptyline for HAs; RTC 4 weeks w/ MRI  09/16/19: KPS 80; con't nortriptyline for HAs; RTC 2 mos w/ MRI  11/11/19: KPS 80; MRI w/ SD though w/ small infarct; proceed w/ CVA workup; start 81mg  ASA; encouraged smoking cessation; con't nortriptyline; RTC to discuss results  11/25/19: KPS 80; discussed CVA workup results; MRA unremarkable; con't smoking cessation efforts; con't nortriptyline for HAs; con't 81mg  ASA, start Lipitor; f/u with PCP for COPD eval; RTC 2 mos w/ MRI     Malignant neoplasm of overlapping sites of right female breast (CMS-HCC)   2017 -  Presenting Symptoms    Large open wound RT breast which began as nipple inversion. Patient states that she ignored for a long time.  Physical exam of the area of concern in the RTbreast demonstrates a large open wound in the lateral right breast. Saw PCP 01/27/16 Rx Keflex.     02/04/2016 Interval Scan(s)    MMG/US: 3.1 (3.0) cm irregular spiculated dense mass in lateral Rt breast w nipple and skin retraction. 2 subcentimeter irregular masses in the RUOQ  (10', 11') (possible satellite lesions). Large Rt  axillary lymph node 1.7. Lt breast clear. BIRAD 5.     02/08/2016 Biopsy    US guided Rt. Axillary LN biopsy:  Gr 3, IDC with apocrine features. ER (-), PR (31-40), HER-2 (3+). Noted to have multiple skin lesions. Poor access to breast mass due open 10-15 cm wound risk of pain and bleeding.     02/09/2016 Initial Diagnosis    Malignant neoplasm of overlapping sites of right female breast (RAF-HCC)     02/10/2016 Interval Scan(s) CT CAP: Large, necrotic right breast mass with wide open tract to the skin, consistent with known right breast malignancy.  Numerous enlarged right axillary, subpectoral, mediastinal, bilateral hilar lymph nodes and innumerable bilateral pulmonary nodules     02/10/2016 Interval Scan(s)    NM Bone scan: No osseous metastatic disease.     04/28/2016 - 03/10/2020 Chemotherapy    OP TRASTUZUMAB (EVERY 21 DAYS)  Trastuzumab 8 mg/kg loading then 6 mg/kg every 21 days     07/13/2016 Genetics    STRATA  ??? ERBB2 copy number alteration  Estimated copy number: 40, confidence interval: 35.7 - 44.0, cellularity: 50%  Associated FDA-approved targeted therapies in breast cancer: trastuzumab, lapatinib, ado-trastuzumab emtansine, pertuzumab  ??? PIK3CA p.[id number]     01/03/2020 -  Cancer Staged    CT CAP 01/03/2020 which showed a new lingular opacity measuring 1.9 cm in the right lung. There was also slight increase in the right breast mass from 1.2-> 1.5 cm.  Bone scan shows no osseous metastases. Overall I considered these findings indeterminate and ppted to repeat CT CAP in 1 month     02/03/2020 -  Cancer Staged    CT chest 02/03/20 which showed that the lingular lesion has persisted and increased in size.  No mediastinal adenopathy reported.  She continues to report a nonproductive cough and mild dyspnea.  She is a chronic smoker and currently smokes on average half a pack per day.  My major concern is that this could be a second primary lung cancer as her other sites of diseases stable on current systemic therapy       02/03/2020 Endocrine/Hormone Therapy    Stop Tamoxifen, Add Fasoldex, continue Q3week Hercpetin     03/23/2020 - 02/09/2021 Chemotherapy    OP BREAST ADO-TRASTUZUMAB EMTANSINE  ado-trastuzumab emtansine 3.6 mg/kg IV on day 1, every 21 days     04/12/2021 -  Chemotherapy    Tucatinib + Capecitabine  OP TRASTUZUMAB (EVERY 21 DAYS)  trastuzumab 8 mg/kg IV LOADING, then 6 mg/kg IV MAINT, every 21 days     Brain metastases (CMS-HCC) 08/07/2017 Initial Diagnosis    Brain metastases (CMS-HCC)    Summary of Radiosurgery   Rx:7 brain met: 09/05/2017: 2,000/2,000 cGy  Sec:R Frontal: 09/05/2017: 2,000 cGy  Sec:L Post Fr: 09/05/2017: 2,000 cGy  Sec:L Ant Fro: 09/05/2017: 2,000 cGy  Sec:R Sup Oc: 09/05/2017: 2,000 cGy  Sec:L Occipita: 09/05/2017: 2,000 cGy  Sec:R Inf Occi: 09/05/2017: 2,000 cGy  Sec:L Tempor: 09/05/2017: 2,000 cGy  Rx:Lt Med Oc: 09/13/2018: 2,000/2,000 cGy  Rx:Rt Frontal: : 2,500/2,500 cGy    01/15/2019: Craniotomy and resection of right posterior frontal lobe metastasis. Followed by [facility]    03/04/19: KPS 80; MRI with postsurgical changes versus residual disease; RTC in 2 weeks w/ MRI     01/03/2020 -  Cancer Staged    CT CAP 01/03/2020 which showed a new lingular opacity measuring 1.9 cm in the right lung. There was also slight increase in the right breast mass  from 1.2-> 1.5 cm.  Bone scan shows no osseous metastases. Overall I considered these findings indeterminate and ppted to repeat CT CAP in 1 month     02/03/2020 -  Cancer Staged    CT chest 02/03/20 which showed that the lingular lesion has persisted and increased in size.  No mediastinal adenopathy reported.  She continues to report a nonproductive cough and mild dyspnea.  She is a chronic smoker and currently smokes on average half a pack per day.  My major concern is that this could be a second primary lung cancer as her other sites of diseases stable on current systemic therapy       02/03/2020 Endocrine/Hormone Therapy    Stop Tamoxifen, Add Fasoldex, continue Q3week Hercpetin     Metastatic breast cancer (CMS-HCC)   01/25/2019 Initial Diagnosis    Metastatic breast cancer (CMS-HCC)     03/23/2020 - 02/09/2021 Chemotherapy    OP BREAST ADO-TRASTUZUMAB EMTANSINE  ado-trastuzumab emtansine 3.6 mg/kg IV on day 1, every 21 days     04/12/2021 -  Chemotherapy    Tucatinib + Capecitabine  OP TRASTUZUMAB (EVERY 21 DAYS)  trastuzumab 8 mg/kg IV LOADING, then 6 mg/kg IV MAINT, every 21 days Patient Active Problem List   Diagnosis   ??? Malignant neoplasm of overlapping sites of right female breast (CMS-HCC)   ??? Peripheral neuropathy   ??? Insomnia   ??? Brain metastases (CMS-HCC)   ??? Nausea   ??? Closed fracture of one rib with routine healing   ??? Diarrhea of presumed infectious origin   ??? Failure to thrive in adult   ??? Tobacco use   ??? Chronic diarrhea   ??? Depression   ??? Hyponatremia   ??? Back pain   ??? Closed fracture of multiple pubic rami, right, initial encounter (CMS-HCC)   ??? Macrocytosis   ??? Pelvic fracture (CMS-HCC)   ??? Metastatic breast cancer (CMS-HCC)   ??? Migraine without aura and without status migrainosus, not intractable   ??? Essential hypertension   ??? CVA (cerebral vascular accident) (CMS-HCC)   ??? DDD (degenerative disc disease), cervical   ??? Wheezing   ??? Dyslipidemia   ??? Angina pectoris (CMS-HCC)   ??? Papillary fibroelastoma of heart   ??? Antineoplastic chemotherapy induced anemia   ??? Celiac disease   ??? Iron deficiency anemia due to chronic blood loss   ??? Dry eye syndrome, bilateral   ??? Meibomian gland dysfunction (MGD) of both eyes   ??? Glaucoma suspect of both eyes   ??? Incipient cataract of both eyes   ??? 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 Fatty liver       Past Medical History:   Diagnosis Date   ??? Abnormal ECG unsure   ??? Alcoholism (CMS-HCC)    ??? Allergic rhinitis    ??? Anemia    ??? Anxiety     insomnia   ??? Arthritis not sure   ??? Brain concussion    ??? Breast cancer (CMS-HCC)     chemo for now with mets   ??? COPD (chronic obstructive pulmonary disease) (CMS-HCC)    ??? Depression    ??? Dysphagia    ??? Fatty liver 07/08/2021   ??? Genitourinary disease    ??? GERD (gastroesophageal reflux disease)    ??? Headache    ??? HTN (hypertension)    ??? Hyperlipidemia    ??? Insomnia    ??? 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  Stroke (CMS-HCC) Nov. 2020    Dr. Theodoro Kalata   ??? Tobacco dependence        Past Surgical History:   Procedure Laterality Date   ??? CESAREAN SECTION     ??? CHOLECYSTECTOMY ??? FINGER SURGERY Right     trigger finger   ??? GANGLION CYST EXCISION Left     hand   ??? HYSTERECTOMY     ??? LYMPH NODE BIOPSY Right 02/08/2016    Axillary   ??? PR BRNSCHSC TNDSC EBUS DX/TX INTERVENTION PERPH LES Left 03/05/2020    Procedure: Bronch, Rigid Or Flexible, Including Fluoro Guidance, When Performed; With Transendoscopic Ebus During Bronchoscopic Diagnostic Or Therapeutic Intervention(S) For Peripheral Lesion(S);  Surgeon: Jerelyn Charles, MD;  Location: MAIN OR [facility];  Service: Pulmonary   ??? PR BRONCHOSCOPY,COMPUTER ASSIST/IMAGE-GUIDED NAVIGATION Left 03/05/2020    Procedure: BRONCHOSCOPY, RIGID OR FLEXIBLE, INCLUDE FLUORO WHEN PERFORMED; W/COMPUTER-ASSIST, IMAGE-GUIDED NAVIGATION;  Surgeon: Jerelyn Charles, MD;  Location: MAIN OR [facility];  Service: Pulmonary   ??? PR BRONCHOSCOPY,DIAGNOSTIC W LAVAGE Left 03/05/2020    Procedure: Bronchoscopy, Rigid Or Flexible, Include Fluoroscopic Guidance When Performed; W/Bronchial Alveolar Lavage;  Surgeon: Jerelyn Charles, MD;  Location: MAIN OR [facility];  Service: Pulmonary   ??? PR BRONCHOSCOPY,TRANSBRON ASPIR BX Left 03/05/2020    Procedure: Bronchoscopy, Rigid/Flex, Incl Fluoro; W/Transbronch Ndl Aspirat Bx, Trachea, Main Stem &/Or Lobar Bronchus;  Surgeon: Jerelyn Charles, MD;  Location: MAIN OR [facility];  Service: Pulmonary   ??? PR BRONCHOSCOPY,TRANSBRONCH BIOPSY Left 03/05/2020    Procedure: Bronchoscopy, Rigid/Flexible, Include Fluoro Guidance When Performed; W/Transbronchial Lung Bx, Single Lobe;  Surgeon: Jerelyn Charles, MD;  Location: MAIN OR [facility];  Service: Pulmonary   ??? PR CATH PLACE/CORON ANGIO, IMG SUPER/INTERP,W LEFT HEART VENTRICULOGRAPHY N/A 05/07/2020    Procedure: Left Heart Catheterization;  Surgeon: Lesle Reek, MD;  Location: [facility] CATH;  Service: Cardiology   ??? PR COLONOSCOPY W/BIOPSY SINGLE/MULTIPLE N/A 10/21/2016    Procedure: COLONOSCOPY, FLEXIBLE, PROXIMAL TO SPLENIC FLEXURE; WITH BIOPSY, SINGLE OR MULTIPLE;  Surgeon: Monte Fantasia, MD;  Location: GI PROCEDURES [facility];  Service: Gastroenterology   ??? PR EXCIS SUPRATENT BRAIN TUMOR Right 01/15/2019    Procedure: CRANIECTOMY; EXC BRAIN TUMOR-SUPRATENTORIAL;  Surgeon: Edison Simon, MD;  Location: MAIN OR [facility];  Service: Neurosurgery   ??? PR INCISE FINGER TENDON SHEATH Left 06/17/2019    Procedure: R-20 TENDON SHEATH INCISION (EG, FOR TRIGGER FINGER);  Surgeon: Daisy Lazar, MD;  Location: ASC OR [facility];  Service: Orthopedics   ??? PR MICROSURG TECHNIQUES,REQ OPER MICROSCOPE Right 01/15/2019    Procedure: MICROSURGICAL TECHNIQUES, REQUIRING USE OF OPERATING MICROSCOPE (LIST SEPARATELY IN ADDITION TO CODE FOR PRIMARY PROCEDURE);  Surgeon: Edison Simon, MD;  Location: MAIN OR [facility];  Service: Neurosurgery   ??? PR STEREOTACTIC COMP ASSIST PROC,CRANIAL,INTRADURAL Right 01/15/2019    Procedure: STEREOTACTIC COMPUTER-ASSISTED (NAVIGATIONAL) PROCEDURE; CRANIAL, INTRADURAL;  Surgeon: Edison Simon, MD;  Location: MAIN OR [facility];  Service: Neurosurgery   ??? PR UPPER GI ENDOSCOPY,BIOPSY N/A 10/21/2016    Procedure: UGI ENDOSCOPY; WITH BIOPSY, SINGLE OR MULTIPLE;  Surgeon: Monte Fantasia, MD;  Location: [facility] PROCED[facility];  Service: Gastroenterology   ??? PR UPPER GI ENDOSCOPY,BIOPSY N/A 04/02/2018    Procedure: UGI ENDOSCOPY; WITH BIOPSY, SINGLE OR MULTIPLE;  Surgeon: Wendall Papa, MD;  [facility]: [facility];  Service: Gastroenterology   ??? SKIN BIOPSY         Current Outpatient Medications   Medication Sig Dispense Refill   ???  albuterol HFA 90 mcg/actuation inhaler Inhale 2 puffs every six (6) hours as needed for wheezing.     ??? aluminum-magnesium hydroxide-simethicone 200-200-20 mg/5 mL Susp 80 mL, diphenhydrAMINE 12.5 mg/5 mL Liqd 200 mg, nystatin 100,000 unit/mL Susp 8,000,000 Units, distilled water Liqd 80 mL, lidocaine 2% viscous 2 % Soln 80 mL Take 5 mL by mouth every six (6) hours as needed. Swish, gargle, spit or swallow if throat issues 80 mL 2   ??? benzonatate (TESSALON) 200 MG capsule Take 1 capsule (200 mg total) by mouth Three (3) times a day as needed for cough. 30 capsule 1   ??? capecitabine (XELODA) 500 MG tablet Take 1 tablet (500 mg total) by mouth two (2) times a day, continuously. 60 tablet 5   ??? clobetasoL (TEMOVATE) 0.05 % ointment Apply twice a day to rash on palm until smooth/clear. 30 g 1   ??? clonazePAM (KLONOPIN) 0.5 MG tablet Take 0.5 mg daily as needed for anxiety.  Do not exceed 0.5 mg/day. 30 tablet 0   ??? diclofenac (VOLTAREN) 50 MG EC tablet Take 1 tablet (50 mg total) by mouth two (2) times a day as needed. With food 60 tablet 0   ??? diclofenac sodium (VOLTAREN) 1 % gel Apply 2 g topically four (4) times a day as needed for arthritis or pain. 150 g 2   ??? diphenoxylate-atropine (LOMOTIL) 2.5-0.025 mg per tablet Take 1 tablet by mouth 4 (four) times a day as needed for diarrhea. 30 tablet 1   ??? dorzolamide (TRUSOPT) 2 % ophthalmic solution Administer 1 drop into the left eye Three (3) times a day. 10 mL 12   ??? empagliflozin (JARDIANCE) 10 mg tablet Take 1 tablet (10 mg total) by mouth in the morning. 30 tablet 11   ??? esomeprazole (NEXIUM) 40 MG capsule Take 1 capsule (40 mg total) by mouth daily. 90 capsule 3   ??? FARXIGA 5 mg Tab tablet Take 5 mg by mouth daily.     ??? HYDROcodone-acetaminophen (NORCO 10-325) 10-325 mg per tablet Take 1 tablet by mouth every twelve (12) hours as needed for pain. 60 tablet 0   ??? lidocaine (LIDODERM) 5 % patch Place 1 patch on the skin daily. Apply to affected area for 12 hours only each day (then remove patch) 30 patch 3   ??? lisinopriL-hydrochlorothiazide (PRINZIDE,ZESTORETIC) 20-25 mg per tablet Take 1 tablet by mouth in the morning. 90 tablet 3   ??? MAGNESIUM ORAL Take by mouth daily. OTC     ??? naloxone (NARCAN) 4 mg nasal spray One spray in either nostril once for known/suspected opioid overdose. May repeat every 2-3 minutes in alternating nostril til EMS arrives 2 each 0   ??? ondansetron (ZOFRAN) 4 MG tablet Take 1 tablet (4 mg total) by mouth every six (6) hours as needed for nausea. 30 tablet 1   ??? potassium chloride (KLOR-CON) 10 MEQ CR tablet Take 1 tablet (10 mEq total) by mouth Two (2) times a day. 180 tablet 0   ??? pregabalin (LYRICA) 200 MG capsule Take 1 capsule (200 mg total) by mouth Two (2) times a day. 60 capsule 2   ??? prochlorperazine (COMPAZINE) 10 MG tablet Take 1 tablet (10 mg total) by mouth every six (6) hours as needed for nausea. 30 tablet 6   ??? senna (SENOKOT) 8.6 mg tablet Take 3 tablets by mouth in the morning. 30 tablet 2   ??? thiamine HCl (VITAMIN B-1 ORAL) Take by mouth daily.      ???  traZODone (DESYREL) 50 MG tablet Take 1.5 tablets (75 mg total) by mouth nightly. 45 tablet 1   ??? tucatinib (TUKYSA) 150 mg tablet Take 1 tablet (150 mg total) by mouth two (2) times a day . 60 tablet 1   ??? tucatinib (TUKYSA) 50 mg tablet Take 2 tablets (100 mg total) by mouth two (2) times a day . 120 tablet 3   ??? UNABLE TO FIND Methadone film     ??? venlafaxine (EFFEXOR-XR) 150 MG 24 hr capsule Take 2 capsules (300 mg total) by mouth in the morning. 60 capsule 2     No current facility-administered medications for this visit.       Allergies:   Allergies   Allergen Reactions   ??? Adhesive Rash   ??? Decadron [Dexamethasone] Anxiety     Mania as well   ??? Suboxone [Buprenorphine-Naloxone] Hallucinations   ??? Tetracycline      Other reaction(s): Other (See Comments)   ??? Oxycodone      Night terrors   ??? Skin Protectants, Misc. Rash   ??? Tegaderm Adhesive-No Drug-Allergy Check Rash       Family History:  Cancer-related family history includes Cancer in her father, maternal grandfather, maternal uncle, maternal uncle, and paternal aunt.  She indicated that the status of her mother is unknown. She indicated that the status of her father is unknown. She indicated that the status of her brother is unknown. She indicated that the status of her maternal grandmother is unknown. She indicated that the status of her maternal grandfather is unknown. She indicated that the status of her paternal grandmother is unknown. She indicated that the status of her paternal grandfather is unknown. She indicated that the status of her maternal aunt is unknown. She indicated that the status of her paternal uncle is unknown. She indicated that the status of her neg hx is unknown. She indicated that the status of her other is unknown.           Lab Results   Component Value Date    CREATININE 0.65 09/10/2021     Lab Results   Component Value Date    ALKPHOS 214 (H) 09/10/2021    BILITOT 0.4 09/10/2021    BILIDIR 0.30 03/29/2021    PROT 7.1 09/10/2021    ALBUMIN 3.6 09/10/2021    ALT 15 09/10/2021    AST 27 09/10/2021                  Burtis Junes, FNP-BC, [facility]  Outpatient Oncology Palliative Care Service  [facility]  [address], [place], [place] [address]  [phone]                 The patient reports they are currently: at home. I spent 24 minutes on the real-time audio and video with the patient on the date of service. I spent an additional 10 minutes on pre- and post-visit activities on the date of service.     The patient was physically located in [place] or a state in which I am permitted to provide care. The patient and/or parent/guardian understood that s/he may incur co-pays and cost sharing, and agreed to the telemedicine visit. The visit was reasonable and appropriate under the circumstances given the patient's presentation at the time.    The patient and/or parent/guardian has been advised of the potential risks and limitations of this mode of treatment (including, but not limited to, the absence of in-person examination) and  has agreed to be treated using telemedicine. The patient's/patient's family's questions regarding telemedicine have been answered.     If the visit was completed in an ambulatory setting, the patient and/or parent/guardian has also been advised to contact their provider???s office for worsening conditions, and seek emergency medical treatment and/or call 911 if the patient deems either necessary.

## 2021-10-19 NOTE — Unmapped (Signed)
10/19/21 confirmed 12/2, 1/25, 1/26, with [name] @ 3:51pm.

## 2021-10-21 MED ORDER — THIAMINE HCL (VITAMIN B1) 50 MG TABLET
ORAL_TABLET | Freq: Every day | ORAL | 1 refills | 90 days | Status: CP
Start: 2021-10-21 — End: 2022-04-19

## 2021-10-21 NOTE — Unmapped (Signed)
Last Visit Date: 09/10/2020  Next Visit Date: Visit date not found    No results found for: CBC, CMP     Results for orders placed or performed during the hospital encounter of 06/23/20   ECG 12 Lead   Result Value Ref Range    EKG Systolic BP  mmHg    EKG Diastolic BP  mmHg    EKG Ventricular Rate 80 BPM    EKG Atrial Rate 80 BPM    EKG P-R Interval 160 ms    EKG QRS Duration 74 ms    EKG Q-T Interval 414 ms    EKG QTC Calculation 477 ms    EKG Calculated P Axis 59 degrees    EKG Calculated R Axis 58 degrees    EKG Calculated T Axis 70 degrees    QTC Fredericia 456 ms

## 2021-10-28 NOTE — Unmapped (Signed)
10/28/21-[name] (sister)states pt just opening Tukysa 50 & 150mg  today and tom will open Capecitabine  So pt still has 30 days of med on hand,advised will f/u in 3 weeks to reschedule delivery-CB

## 2021-11-02 DIAGNOSIS — F5101 Primary insomnia: Principal | ICD-10-CM

## 2021-11-02 MED ORDER — TRAZODONE 50 MG TABLET
ORAL_TABLET | Freq: Every evening | ORAL | 1 refills | 30 days
Start: 2021-11-02 — End: ?

## 2021-11-03 NOTE — Unmapped (Signed)
Assessment/Plan:    Brooke [name] was seen today for knee pain and mass.    Diagnoses and all orders for this visit:    Essential hypertension  -     lisinopriL-hydrochlorothiazide (PRINZIDE,ZESTORETIC) 20-25 mg per tablet; Take 1 tablet by mouth daily.    Malignant neoplasm of overlapping sites of right female breast, unspecified estrogen receptor status (CMS-HCC)  -     aluminum-magnesium hydroxide-simethicone 200-200-20 mg/5 mL Susp 80 mL, diphenhydrAMINE 12.5 mg/5 mL Liqd 200 mg, nystatin 100,000 unit/mL Susp 8,000,000 Units, distilled water Liqd 80 mL, lidocaine 2% viscous 2 % Soln 80 mL; Take 5 mL by mouth every six (6) hours as needed. Swish, gargle, spit or swallow if throat issues    Tobacco use  -     aluminum-magnesium hydroxide-simethicone 200-200-20 mg/5 mL Susp 80 mL, diphenhydrAMINE 12.5 mg/5 mL Liqd 200 mg, nystatin 100,000 unit/mL Susp 8,000,000 Units, distilled water Liqd 80 mL, lidocaine 2% viscous 2 % Soln 80 mL; Take 5 mL by mouth every six (6) hours as needed. Swish, gargle, spit or swallow if throat issues    Metastatic breast cancer (CMS-HCC)  -     aluminum-magnesium hydroxide-simethicone 200-200-20 mg/5 mL Susp 80 mL, diphenhydrAMINE 12.5 mg/5 mL Liqd 200 mg, nystatin 100,000 unit/mL Susp 8,000,000 Units, distilled water Liqd 80 mL, lidocaine 2% viscous 2 % Soln 80 mL; Take 5 mL by mouth every six (6) hours as needed. Swish, gargle, spit or swallow if throat issues    Blister (nonthermal) of oral cavity, initial encounter  -     aluminum-magnesium hydroxide-simethicone 200-200-20 mg/5 mL Susp 80 mL, diphenhydrAMINE 12.5 mg/5 mL Liqd 200 mg, nystatin 100,000 unit/mL Susp 8,000,000 Units, distilled water Liqd 80 mL, lidocaine 2% viscous 2 % Soln 80 mL; Take 5 mL by mouth every six (6) hours as needed. Swish, gargle, spit or swallow if throat issues    Traumatic blister of mouth, sequela  -     aluminum-magnesium hydroxide-simethicone 200-200-20 mg/5 mL Susp 80 mL, diphenhydrAMINE 12.5 mg/5 mL Liqd 200 mg, nystatin 100,000 unit/mL Susp 8,000,000 Units, distilled water Liqd 80 mL, lidocaine 2% viscous 2 % Soln 80 mL; Take 5 mL by mouth every six (6) hours as needed. Swish, gargle, spit or swallow if throat issues    Fatty liver    Celiac disease    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)    Cerebrovascular accident (CVA), unspecified mechanism (CMS-HCC)    Angina pectoris (CMS-HCC)    Brain metastases (CMS-HCC)    Wheezing    Dyslipidemia    Recurrent major depressive disorder, in partial remission (CMS-HCC)        -History of wheezing and tobacco use disorder: probable COPD given her history of tobacco use disorder. She has failed wellbutrin, chantix and nicotine substitution methods. She was smoking less at times.  Pt states she is unable to stop smoking due to stress. She continues on albuterol inhaler for prn use.   She was referred to [facility] pulmonary clinic she was seen in September/2021. The following is noted from that encounter:    ASSESSMENT    ??  Brooke [name] is a 65 y.o. female with a history of GERD, depression, HTN, dyslipidemia, and metastatic HER2+ breast cancer (s/p radiotherapy and chemotherapy with Trastuzumab/Pertuzumab)  whom we are seeing in consultation requested by Brooke [name] for evaluation of shortness of breath.  Patient is presenting with dyspnea on exertion, dry cough and wheezing (mostly nocturnal) which started approximately a year after starting  her breast cancer therapy. Although the history is suggestive of airway disease (possibly worsened by ongoing smoking, sinus and gastroesophageal diseases), her spirometry is more suggestive of moderate restriction with no evidence of airway obstruction. I think some of her symptoms can still be related to GERD/sinus disease and possible airway disease despite spirometric findings.   She does not have significant parenchymal lung disease on CT to explain moderate restriction. Therefore, I suspect a component of neuromuscular weakness (?possibly due to chemotherapy). Additionally, she has anemia that worsened over the past several months (Hgb 13 in 12/2018, now ~7.5). Diastolic dysfunction is also possible given her age and co-morbidities.  Therefore, her dyspnea on exertion is likely multi-factorial. I cannot rule out venous thromboembolism given cancer history.   PLAN     Dyspnea on exertion (multi-factorial)  -- Lung volumes, DLCO, and to further investigate for pulmonary restriction   -- VQ scan to r/o CTED  -- Muscle pressures to r/o neuromuscular weakness  -- If the above workup is inconclusive, will obtain CPET  -- Ok to continue PRN albuterol for now  -- Lifestyle modifications for GERD provided; continue PPI  -- Saline nasal rinses followed by nasal steroids spray  Health maintenance:  -- Vaccines: Will give the flu vaccine today; received the mRNA COVID vaccine  -- Smoking cessation: discussed at length; will refer to the tobacco cessation clinic  -- Lung cancer screening: will discuss next visit??  Patient will return to clinic in 3 months or sooner if needed.  The pt has not had a more recent pulmonary visit.  Pt states she was told to follow up with pulmonary prn only. She is on albuterol inhaler only, prn- every 3 months usage. ??    -History of breast cancer with known Lung and brain mets: The patient is followed closely by [facility] oncology team. Her last oncology visit was last month.  Surveillance chest CT done January/2022 revealed no significant interval change compared to previous study done December/2021.  Abdominal MRI done in March/2022 revealed focal area of hepatic steatosis but no suspicious hepatic lesions identified.        - Fatty liver disease noted on abd MRI done 02/2021: LFTs remained elevated on CMP done October/2022 she has an upcoming imaging of her chest and Abd (CT) and brain MRI in the near future along with bone scan. She was referred to [facility] liver clinic and seen in September/2022 and the following is noted from that encounter:  ASSESSMENT:                                               65 year old with mild elevations in AST/ALT over past year with moderate Alk phos elevation.    Patient likely has prevalent steatohepatitis from a combination of ETOH use (greater in past) and some metabolic syndrome risk factors.  Rapid weight fluctuations (both gaining and losing weight can contribute to steatosis as well).    Labs now normalized in the setting of ETOH abstinence   PLAN:                                                              -  labs reviewed and now normal  -congratulated on ETOH abstinence  -anti craving medicine and counseling offered, declined.  -rtc 6 months   She was to have a follow up in 02/2022 but pt was told only to follow up prn since her liver panel . AST and ALT results had improved this year.   She had upcoming Brain MRI,  chest/abd CT and bone scan scheduled.  ??  - depression/insomnia history:  she remains stable on her current anti depressant with no acute depression symptoms. She takes trazodone prn  for insomnia symptoms.  She continues on Effexor XR 300 mg daily. She is followed by [facility] psychiatry .  She takes Klonopin as needed.     - History  Dyslipidemia, HTN and CVA:  her BP has remained normal on current medication regimen. She is followed by [facility] cardiology.   Her home BP readings remain normal and BP readings have been normal on recent provider visits.   She endorses no acute neurologic issues.    CMP done on recent oncology visit last month was notable for potassium of 2.9, AST of 39 and alk phos of 212.  LFT elevation has been noted in the past.  She had stopped statin therapy early this year due to ongoing chronic pain complaint and elevated LFTs.  Lipid panel done in July/2022 revealed HDL of 85 and LDL of 91. She has since been started by her cardiologist on Farxiga 4 mg daily (not jardiance as originally planned).  Her last cardiology encounter was in 05/2021 - see below for details.     - Chronic neck and upper back/shoulder pain: she has known cervical DDD on C spine MRI in 04/2020.: she has ongoing neck and upper back pain.  She has no numbness or weakness of her UE.  She is referred to [facility] orthopaedics and NS,  for further evaluation. She has received multiple epidural steroid injections this year. She is followed by neurology as well.  Recently started on Subxxone.  She continues on Lyrica and currently reducing dose.   She has been prescribed baclofen 5 mg 3 times daily as needed.She is no longer on any controlled medications for pain.  She has been by [facility] spine clinic. She follows up with ortho prn.      - fibroelastoma of mitral valve and history of diastolic heart failure: .She is followed  by  [facility] cardiology .  She had left heart catheterization in May/2021. Findings revealed no significant CAD with normal left ventricular filling pressures. Echocardiogram was done in February/2021. 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 Otherwise normal. She was seen by [facility] cardiology in 05/2021 and the following is noted :    Impression and Plan:  ILEIGH METTLER is a 65 y.o. female with a history of DM1, HTN, HLD and metastatic breast Cx (on Trastuzumab) who is presenting for cardiology follow up of MV mass.  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 Minimal MR. Likely not etiology of dyspnea.   2. Dyspnea on Exertion/ Chest pain: Concerning for angina pectoris and obstructive coronary artery disease. ??Less likely related to heart failure given physical exam and history without orthopnea, PND, lower extremity swelling. ??No CAD on cath. ??  Discussed the need to stop smoking.  Encouraged her to continue to work on tobacco cessation. Doing better. ??  Today she will begin Jardiance 10mg  daily for diastolic heart failure c/w EMPEROR-Preserved trial. We discussed the  some of the side effects of this medication, particularly low blood sugar and increased risk of vaginal yeast infection.  3. HTN,??well controlled today. Continues to follow up with [name].  She will continue current regimen  4. ??HLD  Continue atorvastatin  Return in about 6 months (around 11/27/2021).   Correction in above cardiology note, the pt is no longer taking atorvastatin and she has started jardiance for diastolic heart failure.     - recent diagnosis of Celiac disease- she is followed by [facility] GI for elevated LFT's/fatty liver disease and last seen by GI in 03/2021 for fibroscan.  Screening viral hepatitis and lupus related lab work/negative.   She is off statin.  She was seen by [facility] GI/liver clinic in 08/2021- see above for encounter details.     I personally spent 40 minutes, face to face, and non face to face, in the care of this patient, which includes pre , intra and post visit time on date of service.  All documented time is specific to EM and dose not include any procedures performed today.          Return in about 6 months (around 05/11/2022) for Recheck.    Subjective:     HPI  We had a telemedicine visit in July/2022.  Her medical conditions are as follows:    -History of wheezing and tobacco use disorder: probable COPD given her history of tobacco use disorder. She has failed wellbutrin, chantix and nicotine substitution methods. She was smoking less at times.  Pt states she is unable to stop smoking due to stress. She continues on albuterol inhaler for prn use.   She was referred to [facility] pulmonary clinic she was seen in September/2021. The following is noted from that encounter:    ASSESSMENT    ??  Ms. Kyer is a 65 y.o. female with a history of GERD, depression, HTN, dyslipidemia, and metastatic HER2+ breast cancer (s/p radiotherapy and chemotherapy with Trastuzumab/Pertuzumab)  whom we are seeing in consultation requested by Brooke [name] for evaluation of shortness of breath.  Patient is presenting with dyspnea on exertion, dry cough and wheezing (mostly nocturnal) which started approximately a year after starting her breast cancer therapy. Although the history is suggestive of airway disease (possibly worsened by ongoing smoking, sinus and gastroesophageal diseases), her spirometry is more suggestive of moderate restriction with no evidence of airway obstruction. I think some of her symptoms can still be related to GERD/sinus disease and possible airway disease despite spirometric findings.   She does not have significant parenchymal lung disease on CT to explain moderate restriction. Therefore, I suspect a component of neuromuscular weakness (?possibly due to chemotherapy). Additionally, she has anemia that worsened over the past several months (Hgb 13 in 12/2018, now ~7.5). Diastolic dysfunction is also possible given her age and co-morbidities.  Therefore, her dyspnea on exertion is likely multi-factorial. I cannot rule out venous thromboembolism given cancer history.   PLAN     Dyspnea on exertion (multi-factorial)  -- Lung volumes, DLCO, and to further investigate for pulmonary restriction   -- VQ scan to r/o CTED  -- Muscle pressures to r/o neuromuscular weakness  -- If the above workup is inconclusive, will obtain CPET  -- Ok to continue PRN albuterol for now  -- Lifestyle modifications for GERD provided; continue PPI  -- Saline nasal rinses followed by nasal steroids spray  Health maintenance:  -- Vaccines: Will give the flu vaccine today; received the mRNA COVID vaccine  --  Smoking cessation: discussed at length; will refer to the tobacco cessation clinic  -- Lung cancer screening: will discuss next visit??  Patient will return to clinic in 3 months or sooner if needed.  The pt has not had a more recent pulmonary visit.  Pt states she was told to follow up with pulmonary prn only. She is on albuterol inhaler only, prn- every 3 months usage. ??    -History of breast cancer with known Lung and brain mets: The patient is followed closely by [facility] oncology team. Her last oncology visit was last month.  Surveillance chest CT done January/2022 revealed no significant interval change compared to previous study done December/2021.  Abdominal MRI done in March/2022 revealed focal area of hepatic steatosis but no suspicious hepatic lesions identified.        - Fatty liver disease noted on abd MRI done 02/2021: LFTs remained elevated on CMP done October/2022 she has an upcoming imaging of her chest and Abd (CT) and brain MRI in the near future along with bone scan. She was referred to [facility] liver clinic and seen in September/2022 and the following is noted from that encounter:  ASSESSMENT:                                               65 year old with mild elevations in AST/ALT over past year with moderate Alk phos elevation.    Patient likely has prevalent steatohepatitis from a combination of ETOH use (greater in past) and some metabolic syndrome risk factors.  Rapid weight fluctuations (both gaining and losing weight can contribute to steatosis as well).    Labs now normalized in the setting of ETOH abstinence   PLAN:                                                              -labs reviewed and now normal  -congratulated on ETOH abstinence  -anti craving medicine and counseling offered, declined.  -rtc 6 months   She was to have a follow up in 02/2022 but pt was told only to follow up prn since her liver panel . AST and ALT results had improved this year.   She had upcoming Brain MRI,  chest/abd CT and bone scan scheduled.  ??  - depression/insomnia history:  she remains stable on her current anti depressant with no acute depression symptoms. She takes trazodone prn  for insomnia symptoms.  She continues on Effexor XR 300 mg daily. She is followed by [facility] psychiatry .  She takes Klonopin as needed.     - History  Dyslipidemia, HTN and CVA:  her BP has remained normal on current medication regimen. She is followed by [facility] cardiology.   Her home BP readings remain normal and BP readings have been normal on recent provider visits.   She endorses no acute neurologic issues.    CMP done on recent oncology visit last month was notable for potassium of 2.9, AST of 39 and alk phos of 212.  LFT elevation has been noted in the past.  She had stopped statin therapy early this year due to ongoing chronic pain complaint and elevated  LFTs.  Lipid panel done in July/2022 revealed HDL of 85 and LDL of 91. She has since been started by her cardiologist on Farxiga 4 mg daily (not jardiance as originally planned).  Her last cardiology encounter was in 05/2021 - see below for details.     - Chronic neck and upper back/shoulder pain: she has known cervical DDD on C spine MRI in 04/2020.: she has ongoing neck and upper back pain.  She has no numbness or weakness of her UE.  She is referred to [facility] orthopaedics and NS,  for further evaluation. She has received multiple epidural steroid injections this year. She is followed by neurology as well.  Recently started on Subxxone.  She continues on Lyrica and currently reducing dose.   She has been prescribed baclofen 5 mg 3 times daily as needed.She is no longer on any controlled medications for pain.  She has been by [facility] spine clinic. She follows up with ortho prn.      - fibroelastoma of mitral valve and history of diastolic heart failure: .She is followed  by  [facility] cardiology .  She had left heart catheterization in May/2021. Findings revealed no significant CAD with normal left ventricular filling pressures. Echocardiogram was done in February/2021. 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 Otherwise normal. She was seen by [facility] cardiology in 05/2021 and the following is noted :    Impression and Plan:  ACCALIA RIGDON is a 65 y.o. female with a history of DM1, HTN, HLD and metastatic breast Cx (on Trastuzumab) who is presenting for cardiology follow up of MV mass.  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 Minimal MR. Likely not etiology of dyspnea.   2. Dyspnea on Exertion/ Chest pain: Concerning for angina pectoris and obstructive coronary artery disease. ??Less likely related to heart failure given physical exam and history without orthopnea, PND, lower extremity swelling. ??No CAD on cath. ??  Discussed the need to stop smoking.  Encouraged her to continue to work on tobacco cessation. Doing better. ??  Today she will begin Jardiance 10mg  daily for diastolic heart failure c/w EMPEROR-Preserved trial. We discussed the some of the side effects of this medication, particularly low blood sugar and increased risk of vaginal yeast infection.  3. HTN,??well controlled today. Continues to follow up with [name].  She will continue current regimen  4. ??HLD  Continue atorvastatin  Return in about 6 months (around 11/27/2021).   Correction in above cardiology note, the pt is no longer taking atorvastatin and she has started jardiance for diastolic heart failure.     - recent diagnosis of Celiac disease- she is followed by [facility] GI for elevated LFT's/fatty liver disease and last seen by GI in 03/2021 for fibroscan.  Screening viral hepatitis and lupus related lab work/negative.   She is off statin.  She was seen by [facility] GI/liver clinic in 08/2021- see above for encounter details.                 ROS  Constitutional:  Denies  unexpected weight loss or gain, or weakness   Eyes:  Denies visual changes  Respiratory:  Denies cough or shortness of breath. No change in exercise  tolerance  Cardiovascular:  Denies chest pain, palpitations or lower extremity swelling   GI:  Denies abdominal pain, diarrhea, constipation   Musculoskeletal:  Denies myalgias  Skin:  Denies nonhealing lesions  Neurologic:  Denies headache, focal  weakness or numbness, tingling  Endocrine:  Denies polyuria or polydypsia   Psychiatric:  Denies depression, anxiety      Outpatient Medications Prior to Visit   Medication Sig Dispense Refill   ??? albuterol HFA 90 mcg/actuation inhaler Inhale 2 puffs every six (6) hours as needed for wheezing.     ??? capecitabine (XELODA) 500 MG tablet Take 1 tablet (500 mg total) by mouth two (2) times a day, continuously. 60 tablet 5   ??? clobetasoL (TEMOVATE) 0.05 % ointment Apply twice a day to rash on palm until smooth/clear. 30 g 1   ??? clonazePAM (KLONOPIN) 0.5 MG tablet Take 0.5 mg daily as needed for anxiety.  Do not exceed 0.5 mg/day. 30 tablet 0   ??? diclofenac (VOLTAREN) 50 MG EC tablet Take 1 tablet (50 mg total) by mouth two (2) times a day as needed. With food 60 tablet 1   ??? diclofenac sodium (VOLTAREN) 1 % gel Apply 2 g topically four (4) times a day as needed for arthritis or pain. 150 g 2   ??? diphenoxylate-atropine (LOMOTIL) 2.5-0.025 mg per tablet Take 1 tablet by mouth 4 (four) times a day as needed for diarrhea. 30 tablet 1   ??? dorzolamide (TRUSOPT) 2 % ophthalmic solution Administer 1 drop into the left eye Three (3) times a day. 10 mL 12   ??? esomeprazole (NEXIUM) 40 MG capsule Take 1 capsule (40 mg total) by mouth daily. 90 capsule 3   ??? FARXIGA 5 mg Tab tablet Take 5 mg by mouth daily.     ??? HYDROcodone-acetaminophen (NORCO 10-325) 10-325 mg per tablet Take 1 tablet by mouth every twelve (12) hours as needed for pain. 60 tablet 0   ??? lidocaine (LIDODERM) 5 % patch Place 1 patch on the skin daily. Apply to affected area for 12 hours only each day (then remove patch) 30 patch 3   ??? MAGNESIUM ORAL Take by mouth daily. OTC     ??? ondansetron (ZOFRAN) 4 MG tablet Take 1 tablet (4 mg total) by mouth every six (6) hours as needed for nausea. 30 tablet 1   ??? potassium chloride (KLOR-CON) 10 MEQ CR tablet Take 2 tablets (20 mEq total) by mouth Two (2) times a day. 360 tablet 0   ??? pregabalin (LYRICA) 200 MG capsule Take 1 capsule (200 mg total) by mouth Two (2) times a day. 60 capsule 2   ??? prochlorperazine (COMPAZINE) 10 MG tablet Take 1 tablet (10 mg total) by mouth every six (6) hours as needed for nausea. 30 tablet 6   ??? senna (SENOKOT) 8.6 mg tablet Take 3 tablets by mouth in the morning. 30 tablet 2   ??? thiamine (VITAMIN B-1) 50 MG tablet Take 1 tablet (50 mg total) by mouth in the morning. 90 tablet 1   ??? traZODone (DESYREL) 50 MG tablet Take 1.5 tablets (75 mg total) by mouth nightly. 45 tablet 1   ??? tucatinib (TUKYSA) 150 mg tablet Take 1 tablet (150 mg total) by mouth two (2) times a day . 60 tablet 1   ??? tucatinib (TUKYSA) 50 mg tablet Take 2 tablets (100 mg total) by mouth two (2) times a day . 120 tablet 3   ??? venlafaxine (EFFEXOR-XR) 150 MG 24 hr capsule Take 2 capsules (300 mg total) by mouth in the morning. 60 capsule 2   ??? lisinopriL-hydrochlorothiazide (PRINZIDE,ZESTORETIC) 20-25 mg per tablet Take 1 tablet by mouth in the morning. 90 tablet 3   ???  naloxone (NARCAN) 4 mg nasal spray One spray in either nostril once for known/suspected opioid overdose. May repeat every 2-3 minutes in alternating nostril til EMS arrives (Patient not taking: Reported on 11/11/2021) 2 each 0   ??? aluminum-magnesium hydroxide-simethicone 200-200-20 mg/5 mL Susp 80 mL, diphenhydrAMINE 12.5 mg/5 mL Liqd 200 mg, nystatin 100,000 unit/mL Susp 8,000,000 Units, distilled water Liqd 80 mL, lidocaine 2% viscous 2 % Soln 80 mL Take 5 mL by mouth every six (6) hours as needed. Swish, gargle, spit or swallow if throat issues (Patient not taking: Reported on 11/11/2021) 80 mL 2   ??? benzonatate (TESSALON) 200 MG capsule Take 1 capsule (200 mg total) by mouth Three (3) times a day as needed for cough. (Patient not taking: Reported on 11/11/2021) 30 capsule 1   ??? empagliflozin (JARDIANCE) 10 mg tablet Take 1 tablet (10 mg total) by mouth in the morning. (Patient not taking: Reported on 11/11/2021) 30 tablet 11   ??? UNABLE TO FIND Methadone film (Patient not taking: Reported on 11/11/2021)       No facility-administered medications prior to visit.         Objective:       Vital Signs  BP 108/50 (BP Site: R Arm, BP Position: Sitting)  - Pulse 71  - Ht 157.5 cm (5' 2)  - Wt 54 kg (119 lb)  - LMP  (LMP Unknown)  - SpO2 97%  - BMI 21.77 kg/m??      Exam  General: normal appearance  EYES: Anicteric sclerae.  ENT: Oropharynx moist.  RESP: Relaxed respiratory effort. Clear to auscultation without wheezes or crackles.   CV: Regular rate and rhythm. Normal S1 and S2. No murmurs or gallops.  No lower extremity edema. Posterior tibial pulses are 2+ and symmetric.  abd exam: non tender, no masses, no HSM   MSK: No focal muscle tenderness.  SKIN: Appropriately warm and moist.  NEURO: Stable gait and coordination.    Allergies:     Adhesive; Decadron [dexamethasone]; Suboxone [buprenorphine-naloxone]; Tetracycline; Oxycodone; Skin protectants, misc.; and Tegaderm adhesive-no drug-allergy check    Current Medications:     Current Outpatient Medications   Medication Sig Dispense Refill   ??? albuterol HFA 90 mcg/actuation inhaler Inhale 2 puffs every six (6) hours as needed for wheezing.     ??? capecitabine (XELODA) 500 MG tablet Take 1 tablet (500 mg total) by mouth two (2) times a day, continuously. 60 tablet 5   ??? clobetasoL (TEMOVATE) 0.05 % ointment Apply twice a day to rash on palm until smooth/clear. 30 g 1   ??? clonazePAM (KLONOPIN) 0.5 MG tablet Take 0.5 mg daily as needed for anxiety.  Do not exceed 0.5 mg/day. 30 tablet 0   ??? diclofenac (VOLTAREN) 50 MG EC tablet Take 1 tablet (50 mg total) by mouth two (2) times a day as needed. With food 60 tablet 1   ??? diclofenac sodium (VOLTAREN) 1 % gel Apply 2 g topically four (4) times a day as needed for arthritis or pain. 150 g 2   ??? diphenoxylate-atropine (LOMOTIL) 2.5-0.025 mg per tablet Take 1 tablet by mouth 4 (four) times a day as needed for diarrhea. 30 tablet 1   ??? dorzolamide (TRUSOPT) 2 % ophthalmic solution Administer 1 drop into the left eye Three (3) times a day. 10 mL 12   ??? esomeprazole (NEXIUM) 40 MG capsule Take 1 capsule (40 mg total) by mouth daily. 90 capsule 3   ??? FARXIGA 5 mg Tab  tablet Take 5 mg by mouth daily.     ??? HYDROcodone-acetaminophen (NORCO 10-325) 10-325 mg per tablet Take 1 tablet by mouth every twelve (12) hours as needed for pain. 60 tablet 0   ??? lidocaine (LIDODERM) 5 % patch Place 1 patch on the skin daily. Apply to affected area for 12 hours only each day (then remove patch) 30 patch 3   ??? MAGNESIUM ORAL Take by mouth daily. OTC     ??? ondansetron (ZOFRAN) 4 MG tablet Take 1 tablet (4 mg total) by mouth every six (6) hours as needed for nausea. 30 tablet 1   ??? potassium chloride (KLOR-CON) 10 MEQ CR tablet Take 2 tablets (20 mEq total) by mouth Two (2) times a day. 360 tablet 0   ??? pregabalin (LYRICA) 200 MG capsule Take 1 capsule (200 mg total) by mouth Two (2) times a day. 60 capsule 2   ??? prochlorperazine (COMPAZINE) 10 MG tablet Take 1 tablet (10 mg total) by mouth every six (6) hours as needed for nausea. 30 tablet 6   ??? senna (SENOKOT) 8.6 mg tablet Take 3 tablets by mouth in the morning. 30 tablet 2   ??? thiamine (VITAMIN B-1) 50 MG tablet Take 1 tablet (50 mg total) by mouth in the morning. 90 tablet 1   ??? traZODone (DESYREL) 50 MG tablet Take 1.5 tablets (75 mg total) by mouth nightly. 45 tablet 1   ??? tucatinib (TUKYSA) 150 mg tablet Take 1 tablet (150 mg total) by mouth two (2) times a day . 60 tablet 1   ??? tucatinib (TUKYSA) 50 mg tablet Take 2 tablets (100 mg total) by mouth two (2) times a day . 120 tablet 3   ??? venlafaxine (EFFEXOR-XR) 150 MG 24 hr capsule Take 2 capsules (300 mg total) by mouth in the morning. 60 capsule 2   ??? aluminum-magnesium hydroxide-simethicone 200-200-20 mg/5 mL Susp 80 mL, diphenhydrAMINE 12.5 mg/5 mL Liqd 200 mg, nystatin 100,000 unit/mL Susp 8,000,000 Units, distilled water Liqd 80 mL, lidocaine 2% viscous 2 % Soln 80 mL Take 5 mL by mouth every six (6) hours as needed. Swish, gargle, spit or swallow if throat issues 80 mL 2   ??? lisinopriL-hydrochlorothiazide (PRINZIDE,ZESTORETIC) 20-25 mg per tablet Take 1 tablet by mouth daily. 90 tablet 3   ??? naloxone (NARCAN) 4 mg nasal spray One spray in either nostril once for known/suspected opioid overdose. May repeat every 2-3 minutes in alternating nostril til EMS arrives (Patient not taking: Reported on 11/11/2021) 2 each 0     No current facility-administered medications for this visit.           Note - This record has been created using [employer]. Chart creation errors have been sought, but may not always have been located. Such creation errors do not reflect on the standard of medical care.    Brooke Milliner, MD

## 2021-11-04 MED ORDER — TRAZODONE 50 MG TABLET
ORAL_TABLET | Freq: Every evening | ORAL | 1 refills | 30 days | Status: CP
Start: 2021-11-04 — End: ?

## 2021-11-04 NOTE — Unmapped (Signed)
error 

## 2021-11-05 ENCOUNTER — Ambulatory Visit: Admit: 2021-11-05 | Discharge: 2021-11-06 | Payer: MEDICARE

## 2021-11-05 DIAGNOSIS — C50919 Malignant neoplasm of unspecified site of unspecified female breast: Principal | ICD-10-CM

## 2021-11-05 DIAGNOSIS — C50811 Malignant neoplasm of overlapping sites of right female breast: Principal | ICD-10-CM

## 2021-11-05 DIAGNOSIS — R110 Nausea: Principal | ICD-10-CM

## 2021-11-05 DIAGNOSIS — K900 Celiac disease: Principal | ICD-10-CM

## 2021-11-05 MED ADMIN — trastuzumab (HERCEPTIN) 340 mg in sodium chloride (NS) 0.9 % 250 mL IVPB: 6 mg/kg | INTRAVENOUS | @ 19:00:00 | Stop: 2021-11-05

## 2021-11-05 MED ADMIN — heparin, porcine (PF) 100 unit/mL injection 500 Units: 500 [IU] | INTRAVENOUS | @ 19:00:00 | Stop: 2021-11-05

## 2021-11-05 NOTE — Unmapped (Signed)
Patient arrived in the infusion clinic at 1300pm.  Weight and Vitals were obtained. Port was accessed and flushed with blood return, dressing clean, dry, and intact.  No premeds or labs ordered for this encounter. Patient was administered chemotherapy regiment as ordered without complications while in the clinic. Port was heparin flushed and de-accessed, covered with a 2x2 gauze and bandaid.  Patient declined after visit summary then discharged home to self care with sister.

## 2021-11-06 DIAGNOSIS — M549 Dorsalgia, unspecified: Principal | ICD-10-CM

## 2021-11-06 DIAGNOSIS — G8929 Other chronic pain: Principal | ICD-10-CM

## 2021-11-06 DIAGNOSIS — M546 Pain in thoracic spine: Principal | ICD-10-CM

## 2021-11-08 DIAGNOSIS — G8929 Other chronic pain: Principal | ICD-10-CM

## 2021-11-08 DIAGNOSIS — M549 Dorsalgia, unspecified: Principal | ICD-10-CM

## 2021-11-08 DIAGNOSIS — M546 Pain in thoracic spine: Principal | ICD-10-CM

## 2021-11-08 MED ORDER — DICLOFENAC SODIUM 50 MG TABLET,DELAYED RELEASE
ORAL_TABLET | Freq: Two times a day (BID) | ORAL | 0 refills | 30.00000 days | Status: CP | PRN
Start: 2021-11-08 — End: 2021-12-08

## 2021-11-08 MED ORDER — LIDOCAINE 5 % TOPICAL PATCH
MEDICATED_PATCH | TRANSDERMAL | 3 refills | 30.00000 days | Status: CP
Start: 2021-11-08 — End: ?

## 2021-11-08 NOTE — Unmapped (Signed)
Forwarded to Dr. [name]

## 2021-11-11 ENCOUNTER — Ambulatory Visit: Admit: 2021-11-11 | Discharge: 2021-11-12 | Payer: MEDICARE

## 2021-11-11 DIAGNOSIS — Z720 Tobacco use: Principal | ICD-10-CM

## 2021-11-11 DIAGNOSIS — I209 Angina pectoris, unspecified: Principal | ICD-10-CM

## 2021-11-11 DIAGNOSIS — S00522A Blister (nonthermal) of oral cavity, initial encounter: Principal | ICD-10-CM

## 2021-11-11 DIAGNOSIS — R062 Wheezing: Principal | ICD-10-CM

## 2021-11-11 DIAGNOSIS — K900 Celiac disease: Principal | ICD-10-CM

## 2021-11-11 DIAGNOSIS — C7802 Secondary malignant neoplasm of left lung: Principal | ICD-10-CM

## 2021-11-11 DIAGNOSIS — C50919 Malignant neoplasm of unspecified site of unspecified female breast: Principal | ICD-10-CM

## 2021-11-11 DIAGNOSIS — C7931 Secondary malignant neoplasm of brain: Principal | ICD-10-CM

## 2021-11-11 DIAGNOSIS — I10 Essential (primary) hypertension: Principal | ICD-10-CM

## 2021-11-11 DIAGNOSIS — C50811 Malignant neoplasm of overlapping sites of right female breast: Principal | ICD-10-CM

## 2021-11-11 DIAGNOSIS — K760 Fatty (change of) liver, not elsewhere classified: Principal | ICD-10-CM

## 2021-11-11 DIAGNOSIS — I639 Cerebral infarction, unspecified: Principal | ICD-10-CM

## 2021-11-11 DIAGNOSIS — E785 Hyperlipidemia, unspecified: Principal | ICD-10-CM

## 2021-11-11 DIAGNOSIS — S00522S Blister (nonthermal) of oral cavity, sequela: Principal | ICD-10-CM

## 2021-11-11 DIAGNOSIS — F3341 Major depressive disorder, recurrent, in partial remission: Principal | ICD-10-CM

## 2021-11-11 MED ORDER — LISINOPRIL 20 MG-HYDROCHLOROTHIAZIDE 25 MG TABLET
ORAL_TABLET | Freq: Every day | ORAL | 3 refills | 90 days | Status: CP
Start: 2021-11-11 — End: 2022-11-11

## 2021-11-11 MED ORDER — MAGIC MOUTHWASH (NYSTATIN/DIPHENHYDRAMINE/MYLANTA) WITH LIDOCAINE ORAL MIXTURE
Freq: Four times a day (QID) | ORAL | 2 refills | 0 days | Status: CP | PRN
Start: 2021-11-11 — End: ?

## 2021-11-14 DIAGNOSIS — F32A Depression, unspecified depression type: Principal | ICD-10-CM

## 2021-11-14 MED ORDER — VENLAFAXINE ER 150 MG CAPSULE,EXTENDED RELEASE 24 HR
ORAL_CAPSULE | Freq: Every day | ORAL | 2 refills | 30.00000 days
Start: 2021-11-14 — End: ?

## 2021-11-14 MED ORDER — CLONAZEPAM 0.5 MG TABLET
ORAL_TABLET | 0 refills | 0 days
Start: 2021-11-14 — End: ?

## 2021-11-16 MED ORDER — VENLAFAXINE ER 150 MG CAPSULE,EXTENDED RELEASE 24 HR
ORAL_CAPSULE | Freq: Every day | ORAL | 2 refills | 30.00000 days | Status: CP
Start: 2021-11-16 — End: ?

## 2021-11-17 ENCOUNTER — Ambulatory Visit: Admit: 2021-11-17 | Discharge: 2021-11-17 | Payer: MEDICARE

## 2021-11-17 ENCOUNTER — Ambulatory Visit
Admit: 2021-11-17 | Discharge: 2021-11-18 | Payer: MEDICARE | Attending: Radiation Oncology | Primary: Radiation Oncology

## 2021-11-17 ENCOUNTER — Telehealth: Admit: 2021-11-17 | Discharge: 2021-11-17 | Payer: MEDICARE

## 2021-11-17 DIAGNOSIS — Z515 Encounter for palliative care: Principal | ICD-10-CM

## 2021-11-17 DIAGNOSIS — G629 Polyneuropathy, unspecified: Principal | ICD-10-CM

## 2021-11-17 DIAGNOSIS — G620 Drug-induced polyneuropathy: Principal | ICD-10-CM

## 2021-11-17 DIAGNOSIS — T451X5A Adverse effect of antineoplastic and immunosuppressive drugs, initial encounter: Principal | ICD-10-CM

## 2021-11-17 MED ORDER — HYDROCODONE 10 MG-ACETAMINOPHEN 325 MG TABLET
ORAL_TABLET | Freq: Two times a day (BID) | ORAL | 0 refills | 30 days | Status: CP | PRN
Start: 2021-11-17 — End: ?

## 2021-11-17 MED ORDER — PREGABALIN 200 MG CAPSULE
ORAL_CAPSULE | Freq: Two times a day (BID) | ORAL | 2 refills | 30 days | Status: CP
Start: 2021-11-17 — End: ?

## 2021-11-17 MED ADMIN — gadobenate dimeglumine (MULTIHANCE) 529 mg/mL (0.1mmol/0.2mL) solution 10 mL: 10 mL | INTRAVENOUS | @ 14:00:00 | Stop: 2021-11-17

## 2021-11-17 NOTE — Unmapped (Unsigned)
OUTPATIENT ONCOLOGY PALLIATIVE CARE    Principal Diagnosis: Ms. Palen is a 65 y.o. female with metastatic breast cancer,  diagnosed in 2017.  Disease sites include lung and brain.     Assessment/Plan:   1.Neuropathic pain in hands and feet-stable and continued mid upper back pain radiating to lower back appears to be arthritic-controlled with current regimen.  Hx of cervical thoracic paraspinal trigger point bilateral this past July 2022 with not as much relief as she has had in the past.     -Continue lyrica 200 mg bid dosing-relief with this.  -Continue hydrocodone 10/325 mg 1 tab 3 times a day as needed for pain. Encouraged to take as needed.   -Continue Effexor 225 mg every day  -Continue diclofenac gel as needed    Opioid use in past:  -Had difficulty tolerating the methadone 2.5 mg dosing due to sedation.  Did not try the 1 mg methadone, so could consider this in the future if needed for the neuropathic discomfort.  Some reluctance in re-starting methadone.  -No relief with tramadol  -oxycodone-night terrors.  -Suboxone-hallucinations    2.  Goals of care-not addressed today.     At prior visits: goals which are to decrease pain and to maintain her independence. Lives alone and [name] lives close by.     Wants to ensure that her quality of life remains high and the wish to keep on going.  Shared concern that she does not want to be in pain.  NP provided reassurance that our team can offer her different treatment strategies to help her with her goal.     Advance care planning-see advance care planning note dated 01/31/2020.  -at prior visits: Patient shared that she has been dreaming about death. She does have her funeral arrangements completed. Shared that she has her wishes written down and her Sister [name] knows where they are regarding her funeral. She still contemplating whether to be cremated or buried and she is leaning toward cremation. patient currently focused on cancer directed therapy.  Prefers to stay in the moment and not discussed things.  We will continue to support and address if she has a change in clinical condition.        -Advance directives scanned in on 11/02/19        HCDM [facility]): Shirley - Sister - [phone]    HCDM, [facility] - Sister - [phone]    At prior visits,   # Controlled substances risk management.  We are not currently prescribing controlled medications for her.   - Patient has a signed pain medication agreement with Outpt Palliative care, completed on 11/01/18, as per standard care. This was signed again on 01/28/19   - [place] database was reviewed today and it was appropriate.   - Urine drug screen was not performed at this visit. Findings: not applicable.   - Patient has received information about safe storage and administration of medications.   - Patient has received a prescription for narcan and shared with [name] and pt.       F/u: Last week of Dec    ----------------------------------------  Referring Provider: From inpatient oncology team  Oncology Team: Breast team-Dr. [name]  PCP: [name], MD      HPI: 65 year old woman with her to overexpressing metastatic breast cancer to her lung and brain.  Was found to have multiple small brain metastases and completed CyberKnife therapy on September 11. Describes ongoing pain in her pelvis, rates it as a  3 out of 10 today.  Has been improving over the last week or 2.  Feels like gabapentin has been helpful, and is also responded well to Tylenol and ibuprofen in the past.  She is taken oxycodone and it gave her night terrors does not want to take it again.  Has taken Dilaudid previously and did not have side effects from this.    Current cancer-directed therapy: capecitabine (Xeloda), tucatinib, and trastuzumab (Herceptin).     Diagnosis of COVID (07/18/21)      Interval hx 11/17/21 NP and [name]    -Doing ok  -Still with aches and pains in upper spine. Not taking the hydrocodone that often-afraid to run out that often  -Neuropathy is ok with the lyrica  -Spending more time with her friend-[name]  -Still losing some wgt  -Mood is ok        Palliative Performance Scale: 70% - Ambulation: Reduced / unable to do normal work, some evidence of disease / Self-Care: Full / Intake: Normal or reduced / Level of Conscious: Full         Coping/Support Issues: Patient reports she is been able to attend church functions and is finding this very helpful. Has boyfriend [name] for the last 15 years.     At prior visits, patient did share that she continues to receive much help from her church friend, [name] and her husband.  They prayed together and she is found this supportive.  She states that her 2 sisters have been supportive, but she feels that they are becoming more less patient with her due to patient's irritability.     Overall coping well, no specific issues identified.  Finding support with her church community, her sister, [name], and prayer.         Social History: Pt lives in senior housing in [place] month, says it's ok and quiet but she is from [place] and liked it there more. She has 4 sisters, 2 nearby and involved in care. She has 1 son-[name], 35yo, who lives about an hour and a half away in [place]. She is divorced.  ??  Goes to [employer] in [place] and gets a lot of support. Her church friend [name] goes with her to chemo appts. Her sisters will go too.    Advance Care Planning: Advance directives scanned into system  HCPOA: See ACP note  Living Will: See ACP note  ACP note: Yes, advance directive scanned in on November 6    Objective     Opioid Risk Tool:      Opioid Risk Tool:   Female  Female    Family history of substance abuse      Alcohol   1  3    Illegal drugs  2  3    Rx drugs  4  4    Personal history of substance abuse      Alcohol  3  3    Illegal drugs  4  4    Rx drugs  5  5    Age between 16--45 years  1  1    History of preadolescent sexual abuse  3 0    Psychological disease      ADD, OCD, bipolar, schizophrenia  2  2    Depression  1  1    Total: 7  (<3 low risk, 4-7 moderate risk, >8 high risk)      Oncology History Overview Note   Identifying Statement:  Kemba Hoppes is a 65 y.o. female diagnosed with a right posterior frontal lobe metastasis (breast primary) status post resection 01/15/2019 and 5/5 fractions of SBRT (2500 Gy via CyberKnife) to the resection cavity.    Treatment History:  09/05/17: S/p SRS to 7 lesions  01/15/19: S/p resection, followed by SRS 2500 cGy   03/04/19: KPS 80; MRI with postsurgical changes versus residual disease; RTC in 2 weeks w/ MRI   04/15/19: KPS NA; MRI w/ SD; no study; RTC 6 weeks w/ MRI   06/10/19: KPS 80; MRI w/ SD; RTC 4 weeks  07/15/19: KPS 80; MRI w/ SD; RTC 8 weeks  08/19/19: KPS 80; start nortriptyline for HAs; RTC 4 weeks w/ MRI  09/16/19: KPS 80; con't nortriptyline for HAs; RTC 2 mos w/ MRI  11/11/19: KPS 80; MRI w/ SD though w/ small infarct; proceed w/ CVA workup; start 81mg  ASA; encouraged smoking cessation; con't nortriptyline; RTC to discuss results  11/25/19: KPS 80; discussed CVA workup results; MRA unremarkable; con't smoking cessation efforts; con't nortriptyline for HAs; con't 81mg  ASA, start Lipitor; f/u with PCP for COPD eval; RTC 2 mos w/ MRI     Malignant neoplasm of overlapping sites of right female breast (CMS-HCC)   2017 -  Presenting Symptoms    Large open wound RT breast which began as nipple inversion. Patient states that she ignored for a long time.  Physical exam of the area of concern in the RTbreast demonstrates a large open wound in the lateral right breast. Saw PCP 01/27/16 Rx Keflex.     02/04/2016 Interval Scan(s)    MMG/US: 3.1 (3.0) cm irregular spiculated dense mass in lateral Rt breast w nipple and skin retraction. 2 subcentimeter irregular masses in the RUOQ  (10', 11') (possible satellite lesions). Large Rt axillary lymph node 1.7. Lt breast clear. BIRAD 5.     02/08/2016 Biopsy    US guided Rt. Axillary LN biopsy:  Gr 3, IDC with apocrine features. ER (-), PR (31-40), HER-2 (3+). Noted to have multiple skin lesions. Poor access to breast mass due open 10-15 cm wound risk of pain and bleeding.     02/09/2016 Initial Diagnosis    Malignant neoplasm of overlapping sites of right female breast (RAF-HCC)     02/10/2016 Interval Scan(s)    CT CAP: Large, necrotic right breast mass with wide open tract to the skin, consistent with known right breast malignancy.  Numerous enlarged right axillary, subpectoral, mediastinal, bilateral hilar lymph nodes and innumerable bilateral pulmonary nodules     02/10/2016 Interval Scan(s)    NM Bone scan: No osseous metastatic disease.     04/28/2016 - 03/10/2020 Chemotherapy    OP TRASTUZUMAB (EVERY 21 DAYS)  Trastuzumab 8 mg/kg loading then 6 mg/kg every 21 days     07/13/2016 Genetics    STRATA  ??? ERBB2 copy number alteration  Estimated copy number: 40, confidence interval: 35.7 - 44.0, cellularity: 50%  Associated FDA-approved targeted therapies in breast cancer: trastuzumab, lapatinib, ado-trastuzumab emtansine, pertuzumab  ??? PIK3CA p.E545A     01/03/2020 -  Cancer Staged    CT CAP 01/03/2020 which showed a new lingular opacity measuring 1.9 cm in the right lung. There was also slight increase in the right breast mass from 1.2-> 1.5 cm.  Bone scan shows no osseous metastases. Overall I considered these findings indeterminate and ppted to repeat CT CAP in 1 month     02/03/2020 -  Cancer Staged    CT chest 02/03/20 which  showed that the lingular lesion has persisted and increased in size.  No mediastinal adenopathy reported.  She continues to report a nonproductive cough and mild dyspnea.  She is a chronic smoker and currently smokes on average half a pack per day.  My major concern is that this could be a second primary lung cancer as her other sites of diseases stable on current systemic therapy       02/03/2020 Endocrine/Hormone Therapy    Stop Tamoxifen, Add Fasoldex, continue Q3week Hercpetin     03/23/2020 - 02/09/2021 Chemotherapy    OP BREAST ADO-TRASTUZUMAB EMTANSINE  ado-trastuzumab emtansine 3.6 mg/kg IV on day 1, every 21 days     04/12/2021 -  Chemotherapy    Tucatinib + Capecitabine  OP TRASTUZUMAB (EVERY 21 DAYS)  trastuzumab 8 mg/kg IV LOADING, then 6 mg/kg IV MAINT, every 21 days     Brain metastases (CMS-HCC)   08/07/2017 Initial Diagnosis    Brain metastases (CMS-HCC)    Summary of Radiosurgery   Rx:7 brain met: 09/05/2017: 2,000/2,000 cGy  Sec:R Frontal: 09/05/2017: 2,000 cGy  Sec:L Post Fr: 09/05/2017: 2,000 cGy  Sec:L Ant Fro: 09/05/2017: 2,000 cGy  Sec:R Sup Oc: 09/05/2017: 2,000 cGy  Sec:L Occipita: 09/05/2017: 2,000 cGy  Sec:R Inf Occi: 09/05/2017: 2,000 cGy  Sec:L Tempor: 09/05/2017: 2,000 cGy  Rx:Lt Med Oc: 09/13/2018: 2,000/2,000 cGy  Rx:Rt Frontal: : 2,500/2,500 cGy    01/15/2019: Craniotomy and resection of right posterior frontal lobe metastasis. Followed by [facility]    03/04/19: KPS 80; MRI with postsurgical changes versus residual disease; RTC in 2 weeks w/ MRI     01/03/2020 -  Cancer Staged    CT CAP 01/03/2020 which showed a new lingular opacity measuring 1.9 cm in the right lung. There was also slight increase in the right breast mass from 1.2-> 1.5 cm.  Bone scan shows no osseous metastases. Overall I considered these findings indeterminate and ppted to repeat CT CAP in 1 month     02/03/2020 -  Cancer Staged    CT chest 02/03/20 which showed that the lingular lesion has persisted and increased in size.  No mediastinal adenopathy reported.  She continues to report a nonproductive cough and mild dyspnea.  She is a chronic smoker and currently smokes on average half a pack per day.  My major concern is that this could be a second primary lung cancer as her other sites of diseases stable on current systemic therapy       02/03/2020 Endocrine/Hormone Therapy    Stop Tamoxifen, Add Fasoldex, continue Q3week Hercpetin     Metastatic breast cancer (CMS-HCC)   01/25/2019 Initial Diagnosis    Metastatic breast cancer (CMS-HCC)     03/23/2020 - 02/09/2021 Chemotherapy    OP BREAST ADO-TRASTUZUMAB EMTANSINE  ado-trastuzumab emtansine 3.6 mg/kg IV on day 1, every 21 days     04/12/2021 -  Chemotherapy    Tucatinib + Capecitabine  OP TRASTUZUMAB (EVERY 21 DAYS)  trastuzumab 8 mg/kg IV LOADING, then 6 mg/kg IV MAINT, every 21 days         Patient Active Problem List   Diagnosis   ??? Malignant neoplasm of overlapping sites of right female breast (CMS-HCC)   ??? Peripheral neuropathy   ??? Insomnia   ??? Brain metastases (CMS-HCC)   ??? Nausea   ??? Closed fracture of one rib with routine healing   ??? Diarrhea of presumed infectious origin   ??? Failure to thrive in adult   ??? Tobacco  use   ??? Chronic diarrhea   ??? Depression   ??? Hyponatremia   ??? Back pain   ??? Closed fracture of multiple pubic rami, right, initial encounter (CMS-HCC)   ??? Macrocytosis   ??? Pelvic fracture (CMS-HCC)   ??? Metastatic breast cancer (CMS-HCC)   ??? Migraine without aura and without status migrainosus, not intractable   ??? Essential hypertension   ??? CVA (cerebral vascular accident) (CMS-HCC)   ??? DDD (degenerative disc disease), cervical   ??? Wheezing   ??? Dyslipidemia   ??? Angina pectoris (CMS-HCC)   ??? Papillary fibroelastoma of heart   ??? Antineoplastic chemotherapy induced anemia   ??? Celiac disease   ??? Iron deficiency anemia due to chronic blood loss   ??? Dry eye syndrome, bilateral   ??? Meibomian gland dysfunction (MGD) of both eyes   ??? Glaucoma suspect of both eyes   ??? Incipient cataract of both eyes   ??? 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 Fatty liver       Past Medical History:   Diagnosis Date   ??? Abnormal ECG unsure   ??? Alcoholism (CMS-HCC)    ??? Allergic rhinitis    ??? Anemia    ??? Anxiety     insomnia   ??? Arthritis not sure   ??? Brain concussion    ??? Breast cancer (CMS-HCC)     chemo for now with mets   ??? COPD (chronic obstructive pulmonary disease) (CMS-HCC)    ??? Depression    ??? Dysphagia    ??? Fatty liver 07/08/2021   ??? Genitourinary disease    ??? GERD (gastroesophageal reflux disease)    ??? Headache    ??? HTN (hypertension)    ??? Hyperlipidemia    ??? Insomnia    ??? 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 Stroke (CMS-HCC) Nov. 2020    Dr. Theodoro Kalata   ??? Tobacco dependence        Past Surgical History:   Procedure Laterality Date   ??? CESAREAN SECTION     ??? CHOLECYSTECTOMY     ??? FINGER SURGERY Right     trigger finger   ??? GANGLION CYST EXCISION Left     hand   ??? HYSTERECTOMY     ??? LYMPH NODE BIOPSY Right 02/08/2016    Axillary   ??? PR BRNSCHSC TNDSC EBUS DX/TX INTERVENTION PERPH LES Left 03/05/2020    Procedure: Bronch, Rigid Or Flexible, Including Fluoro Guidance, When Performed; With Transendoscopic Ebus During Bronchoscopic Diagnostic Or Therapeutic Intervention(S) For Peripheral Lesion(S);  Surgeon: Jerelyn Charles, MD;  Location: MAIN [facility];  Service: Pulmonary   ??? PR BRONCHOSCOPY,COMPUTER ASSIST/IMAGE-GUIDED NAVIGATION Left 03/05/2020    Procedure: BRONCHOSCOPY, RIGID OR FLEXIBLE, INCLUDE FLUORO WHEN PERFORMED; W/COMPUTER-ASSIST, IMAGE-GUIDED NAVIGATION;  Surgeon: Jerelyn Charles, MD;  Location: MAIN OR [facility];  Service: Pulmonary   ??? PR BRONCHOSCOPY,DIAGNOSTIC W LAVAGE Left 03/05/2020    Procedure: Bronchoscopy, Rigid Or Flexible, Include Fluoroscopic Guidance When Performed; W/Bronchial Alveolar Lavage;  Surgeon: Jerelyn Charles, MD;  Location: MAIN OR [facility];  Service: Pulmonary   ??? PR BRONCHOSCOPY,TRANSBRON ASPIR BX Left 03/05/2020    Procedure: Bronchoscopy, Rigid/Flex, Incl Fluoro; W/Transbronch Ndl Aspirat Bx, Trachea, Main Stem &/Or Lobar Bronchus;  Surgeon: Jerelyn Charles, MD;  Location: MAIN OR [facility];  Service: Pulmonary   ??? PR BRONCHOSCOPY,TRANSBRONCH BIOPSY Left 03/05/2020    Procedure: Bronchoscopy, Rigid/Flexible, Include Fluoro Guidance When Performed; W/Transbronchial Lung Bx, Single  Lobe;  Surgeon: Jerelyn Charles, MD;  Location: MAIN OR [facility];  Service: Pulmonary   ??? PR CATH PLACE/CORON ANGIO, IMG SUPER/INTERP,W LEFT HEART VENTRICULOGRAPHY N/A 05/07/2020    Procedure: Left Heart Catheterization;  Surgeon: Lesle Reek, MD;  Location: [facility] CATH;  Service: Cardiology   ??? PR COLONOSCOPY W/BIOPSY SINGLE/MULTIPLE N/A 10/21/2016    Procedure: COLONOSCOPY, FLEXIBLE, PROXIMAL TO SPLENIC FLEXURE; WITH BIOPSY, SINGLE OR MULTIPLE;  Surgeon: Monte Fantasia, MD;  [facility]: GI PROCEDURES [facility];  Service: Gastroenterology   ??? PR EXCIS SUPRATENT BRAIN TUMOR Right 01/15/2019    Procedure: CRANIECTOMY; EXC BRAIN TUMOR-SUPRATENTORIAL;  Surgeon: Edison Simon, MD;  Location: MAIN OR [facility];  Service: Neurosurgery   ??? PR INCISE FINGER TENDON SHEATH Left 06/17/2019    Procedure: R-20 TENDON SHEATH INCISION (EG, FOR TRIGGER FINGER);  Surgeon: Daisy Lazar, MD;  Location: ASC OR [facility];  Service: Orthopedics   ??? PR MICROSURG TECHNIQUES,REQ OPER MICROSCOPE Right 01/15/2019    Procedure: MICROSURGICAL TECHNIQUES, REQUIRING USE OF OPERATING MICROSCOPE (LIST SEPARATELY IN ADDITION TO CODE FOR PRIMARY PROCEDURE);  Surgeon: Edison Simon, MD;  Location: MAIN OR [facility];  Service: Neurosurgery   ??? PR STEREOTACTIC COMP ASSIST PROC,CRANIAL,INTRADURAL Right 01/15/2019    Procedure: STEREOTACTIC COMPUTER-ASSISTED (NAVIGATIONAL) PROCEDURE; CRANIAL, INTRADURAL;  Surgeon: Edison Simon, MD;  Location: MAIN OR [facility];  Service: Neurosurgery   ??? PR UPPER GI ENDOSCOPY,BIOPSY N/A 10/21/2016    Procedure: UGI ENDOSCOPY; WITH BIOPSY, SINGLE OR MULTIPLE;  Surgeon: Monte Fantasia, MD;  Location: [facility]EDURES [facility];  Service: Gastroenterology   ??? PR UPPER GI ENDOSCOPY,BIOPSY N/A 04/02/2018    Procedure: UGI ENDOSCOPY; WITH BIOPSY, SINGLE OR MULTIPLE;  Surgeon: Wendall Papa, MD;  [facility]: [facility];  Service: Gastroenterology   ??? SKIN BIOPSY         Current Outpatient Medications   Medication Sig Dispense Refill   ??? albuterol HFA 90 mcg/actuation inhaler Inhale 2 puffs every six (6) hours as needed for wheezing.     ??? aluminum-magnesium hydroxide-simethicone 200-200-20 mg/5 mL Susp 80 mL, diphenhydrAMINE 12.5 mg/5 mL Liqd 200 mg, nystatin 100,000 unit/mL Susp 8,000,000 Units, distilled water Liqd 80 mL, lidocaine 2% viscous 2 % Soln 80 mL Take 5 mL by mouth every six (6) hours as needed. Swish, gargle, spit or swallow if throat issues 80 mL 2   ??? capecitabine (XELODA) 500 MG tablet Take 1 tablet (500 mg total) by mouth two (2) times a day, continuously. 60 tablet 5   ??? clobetasoL (TEMOVATE) 0.05 % ointment Apply twice a day to rash on palm until smooth/clear. 30 g 1   ??? clonazePAM (KLONOPIN) 0.5 MG tablet Take 0.5 mg daily as needed for anxiety.  Do not exceed 0.5 mg/day. 30 tablet 0   ??? diclofenac (VOLTAREN) 50 MG EC tablet Take 1 tablet (50 mg total) by mouth two (2) times a day as needed. With food 60 tablet 1   ??? diclofenac sodium (VOLTAREN) 1 % gel Apply 2 g topically four (4) times a day as needed for arthritis or pain. 150 g 2   ??? diphenoxylate-atropine (LOMOTIL) 2.5-0.025 mg per tablet Take 1 tablet by mouth 4 (four) times a day as needed for diarrhea. 30 tablet 1   ??? dorzolamide (TRUSOPT) 2 % ophthalmic solution Administer 1 drop into the left eye Three (3) times a day. 10 mL 12   ??? esomeprazole (NEXIUM) 40 MG capsule Take 1 capsule (40 mg total) by mouth daily.  90 capsule 3   ??? FARXIGA 5 mg Tab tablet Take 5 mg by mouth daily.     ??? HYDROcodone-acetaminophen (NORCO 10-325) 10-325 mg per tablet Take 1 tablet by mouth every twelve (12) hours as needed for pain. 60 tablet 0   ??? lidocaine (LIDODERM) 5 % patch Place 1 patch on the skin daily. Apply to affected area for 12 hours only each day (then remove patch) 30 patch 3   ??? lisinopriL-hydrochlorothiazide (PRINZIDE,ZESTORETIC) 20-25 mg per tablet Take 1 tablet by mouth daily. 90 tablet 3   ??? MAGNESIUM ORAL Take by mouth daily. OTC     ??? naloxone (NARCAN) 4 mg nasal spray One spray in either nostril once for known/suspected opioid overdose. May repeat every 2-3 minutes in alternating nostril til EMS arrives (Patient not taking: Reported on 11/11/2021) 2 each 0   ??? ondansetron (ZOFRAN) 4 MG tablet Take 1 tablet (4 mg total) by mouth every six (6) hours as needed for nausea. 30 tablet 1   ??? potassium chloride (KLOR-CON) 10 MEQ CR tablet Take 2 tablets (20 mEq total) by mouth Two (2) times a day. 360 tablet 0   ??? pregabalin (LYRICA) 200 MG capsule Take 1 capsule (200 mg total) by mouth Two (2) times a day. 60 capsule 2   ??? prochlorperazine (COMPAZINE) 10 MG tablet Take 1 tablet (10 mg total) by mouth every six (6) hours as needed for nausea. 30 tablet 6   ??? senna (SENOKOT) 8.6 mg tablet Take 3 tablets by mouth in the morning. 30 tablet 2   ??? thiamine (VITAMIN B-1) 50 MG tablet Take 1 tablet (50 mg total) by mouth in the morning. 90 tablet 1   ??? traZODone (DESYREL) 50 MG tablet Take 1.5 tablets (75 mg total) by mouth nightly. 45 tablet 1   ??? tucatinib (TUKYSA) 150 mg tablet Take 1 tablet (150 mg total) by mouth two (2) times a day . 60 tablet 1   ??? tucatinib (TUKYSA) 50 mg tablet Take 2 tablets (100 mg total) by mouth two (2) times a day . 120 tablet 3   ??? venlafaxine (EFFEXOR-XR) 150 MG 24 hr capsule Take 2 capsules (300 mg total) by mouth daily. 60 capsule 2     No current facility-administered medications for this visit.       Allergies:   Allergies   Allergen Reactions   ??? Adhesive Rash   ??? Decadron [Dexamethasone] Anxiety     Mania as well   ??? Suboxone [Buprenorphine-Naloxone] Hallucinations   ??? Tetracycline      Other reaction(s): Other (See Comments)   ??? Oxycodone      Night terrors   ??? Skin Protectants, Misc. Rash   ??? Tegaderm Adhesive-No Drug-Allergy Check Rash       Family History:  Cancer-related family history includes Cancer in her father, maternal grandfather, maternal uncle, maternal uncle, and paternal aunt.  She indicated that the status of her mother is unknown. She indicated that the status of her father is unknown. She indicated that the status of her brother is unknown. She indicated that the status of her maternal grandmother is unknown. She indicated that the status of her maternal grandfather is unknown. She indicated that the status of her paternal grandmother is unknown. She indicated that the status of her paternal grandfather is unknown. She indicated that the status of her maternal aunt is unknown. She indicated that the status of her paternal uncle is unknown. She indicated that the status  of her neg hx is unknown. She indicated that the status of her other is unknown.           Lab Results   Component Value Date    CREATININE 0.62 10/19/2021     Lab Results   Component Value Date    ALKPHOS 212 (H) 10/19/2021    BILITOT 0.5 10/19/2021    BILIDIR 0.30 03/29/2021    PROT 6.9 10/19/2021    ALBUMIN 3.7 10/19/2021    ALT 21 10/19/2021    AST 39 (H) 10/19/2021                  Burtis Junes, FNP-BC, [facility]  Outpatient Oncology Palliative Care Service  [facility]  [address], [place], [place] [address]  [phone]                 The patient reports they are currently: at home. I spent 17 minutes on the real-time audio and video with the patient on the date of service. I spent an additional 10 minutes on pre- and post-visit activities on the date of service.     The patient was physically located in [place] or a state in which I am permitted to provide care. The patient and/or parent/guardian understood that s/he may incur co-pays and cost sharing, and agreed to the telemedicine visit. The visit was reasonable and appropriate under the circumstances given the patient's presentation at the time.    The patient and/or parent/guardian has been advised of the potential risks and limitations of this mode of treatment (including, but not limited to, the absence of in-person examination) and has agreed to be treated using telemedicine. The patient's/patient's family's questions regarding telemedicine have been answered.     If the visit was completed in an ambulatory setting, the patient and/or parent/guardian has also been advised to contact their provider???s office for worsening conditions, and seek emergency medical treatment and/or call 911 if the patient deems either necessary. service. I spent an additional 10 minutes on pre- and post-visit activities on the date of service.     The patient was physically located in [place] or a state in which I am permitted to provide care. The patient and/or parent/guardian understood that s/he may incur co-pays and cost sharing, and agreed to the telemedicine visit. The visit was reasonable and appropriate under the circumstances given the patient's presentation at the time.    The patient and/or parent/guardian has been advised of the potential risks and limitations of this mode of treatment (including, but not limited to, the absence of in-person examination) and has agreed to be treated using telemedicine. The patient's/patient's family's questions regarding telemedicine have been answered.     If the visit was completed in an ambulatory setting, the patient and/or parent/guardian has also been advised to contact their provider???s office for worsening conditions, and seek emergency medical treatment and/or call 911 if the patient deems either necessary.

## 2021-11-18 NOTE — Unmapped (Signed)
Radiation Oncology Follow Up Visit Note    Telemedicine Visit  This patient visit was completed through the use of an audio/video or telephone encounter.      This patient encounter is appropriate and reasonable under the circumstances given the patient's particular presentation at this time. The patient has been advised of the potential risks and limitations of this mode of treatment (including, but not limited to, the absence of in-person examination) and has agreed to be treated in a remote fashion in spite of them. Any and all of the patient's/patient's family's questions on this issue have been answered.     The patient has also been advised to contact this office for worsening conditions or problems, and seek emergency medical treatment and/or call 911 if the patient deems either necessary.    Patient identity verbally confirmed: Yes.  Verbal consent for telemedicine encounter: Yes.  Location of patient Brooke [name], State): [place]  Location of provider [place], [place]): [place]  Encounter medium: audio  Software platform: Telephone  Attendees: Dr. Flonnie Hailstone, Dr. Emilio Aspen  Total time: 13 minutes  [name], MD  November 17, 2021   4:07 PM  Referring Provider: [name]    Patient Name: Brooke [name]  Patient Age: 65 y.o.  Encounter Date: 11/17/2021    Referring Physician:   [name], MD  [address]  [facility]  [place],  [place] [address]    Primary Care Provider:  [name], MD    Diagnoses:  1. Brain metastases (CMS-HCC)        Treatment Site:   7 Brain Mets, 20 Gy x 1 = 20 Gy completed 09/05/2017  R Frontal  L Post Frontal  L Ant Frontal  R Sup Occipital  L Occipital  R Inf Occipital  L Temporal    Lt Med Occipital Met, 20 Gy x 1 = 20 Gy completed 09/13/18    Rt Frontal Met, 5 Gy x 5 = 25 Gy completed 02/28/2019    Interval Since Completion of Treatment: 4 years and 2 months from initial and 2 years and 8 months from most recent      Assessment: Brooke [name] is a 65 y.o. female with metastatic breast cancer, ER-/PR+/Her2+, s/p CK to 7 lesions in September 2018, 1 lesion in 08/2018, resection of a right frontal mass  (01/15/2019) which was previously irradiated but with pathology showing progressive HER2+ breast cancer, completed 25 Gy in 5 fx to right frontal post-op bed on 02/28/2019.      She has been cared for systemically by Dr [name]. Presently she is taking capecitabine 500mg  BID and tucatinib 150mg  BID and trastuzumab q 3 weeks.    Plan:     -Disease Status: stable intracranial disease     -Follow-up: 4 month with MRI Brain w and wo contrast and clinic visit. Will have appointments at [facility] for next appointment.     Interval History:  Brooke [name] returns today for a regularly scheduled follow-up. She states she's had headaches that are controlled via Norco. She also has pain in the right ear from time to time and occasionally can be the left ear. She endorses sinus headaches once a week when [place] the country. Her vision feels off but she follows with ophthalmology. She states otherwise she hasn't had any other changes.      Review of Systems: All other systems reviewed are negative. Pertinent positives and negatives are above in interval history.  Past Medical, Surgical, Family and Social Histories reviewed and updated in the electronic medical record.    Oncology History Overview Note   Identifying Statement:  [name] is a 65 y.o. female diagnosed with a right posterior frontal lobe metastasis (breast primary) status post resection 01/15/2019 and 5/5 fractions of SBRT (2500 Gy via CyberKnife) to the resection cavity.    Treatment History:  09/05/17: S/p SRS to 7 lesions  01/15/19: S/p resection, followed by SRS 2500 cGy   03/04/19: KPS 80; MRI with postsurgical changes versus residual disease; RTC in 2 weeks w/ MRI   04/15/19: KPS NA; MRI w/ SD; no study; RTC 6 weeks w/ MRI   06/10/19: KPS 80; MRI w/ SD; RTC 4 weeks  07/15/19: KPS 80; MRI w/ SD; RTC 8 weeks  08/19/19: KPS 80; start nortriptyline for HAs; RTC 4 weeks w/ MRI  09/16/19: KPS 80; con't nortriptyline for HAs; RTC 2 mos w/ MRI  11/11/19: KPS 80; MRI w/ SD though w/ small infarct; proceed w/ CVA workup; start 81mg  ASA; encouraged smoking cessation; con't nortriptyline; RTC to discuss results  11/25/19: KPS 80; discussed CVA workup results; MRA unremarkable; con't smoking cessation efforts; con't nortriptyline for HAs; con't 81mg  ASA, start Lipitor; f/u with PCP for COPD eval; RTC 2 mos w/ MRI     Malignant neoplasm of overlapping sites of right female breast (CMS-HCC)   2017 -  Presenting Symptoms    Large open wound RT breast which began as nipple inversion. Patient states that she ignored for a long time.  Physical exam of the area of concern in the RTbreast demonstrates a large open wound in the lateral right breast. Saw PCP 01/27/16 Rx Keflex.     02/04/2016 Interval Scan(s)    MMG/US: 3.1 (3.0) cm irregular spiculated dense mass in lateral Rt breast w nipple and skin retraction. 2 subcentimeter irregular masses in the RUOQ  (10', 11') (possible satellite lesions). Large Rt axillary lymph node 1.7. Lt breast clear. BIRAD 5.     02/08/2016 Biopsy    US guided Rt. Axillary LN biopsy:  Gr 3, IDC with apocrine features. ER (-), PR (31-40), HER-2 (3+). Noted to have multiple skin lesions. Poor access to breast mass due open 10-15 cm wound risk of pain and bleeding.     02/09/2016 Initial Diagnosis    Malignant neoplasm of overlapping sites of right female breast (RAF-HCC)     02/10/2016 Interval Scan(s)    CT CAP: Large, necrotic right breast mass with wide open tract to the skin, consistent with known right breast malignancy.  Numerous enlarged right axillary, subpectoral, mediastinal, bilateral hilar lymph nodes and innumerable bilateral pulmonary nodules     02/10/2016 Interval Scan(s)    NM Bone scan: No osseous metastatic disease.     04/28/2016 - 03/10/2020 Chemotherapy    OP TRASTUZUMAB (EVERY 21 DAYS)  Trastuzumab 8 mg/kg loading then 6 mg/kg every 21 days     07/13/2016 Genetics    STRATA  ??? ERBB2 copy number alteration  Estimated copy number: 40, confidence interval: 35.7 - 44.0, cellularity: 50%  Associated FDA-approved targeted therapies in breast cancer: trastuzumab, lapatinib, ado-trastuzumab emtansine, pertuzumab  ??? [id number] [id number]     01/03/2020 -  Cancer Staged    CT CAP 01/03/2020 which showed a new lingular opacity measuring 1.9 cm in the right lung. There was also slight increase in the right breast mass from 1.2-> 1.5 cm.  Bone scan shows no osseous metastases. Overall I considered these  findings indeterminate and ppted to repeat CT CAP in 1 month     02/03/2020 -  Cancer Staged    CT chest 02/03/20 which showed that the lingular lesion has persisted and increased in size.  No mediastinal adenopathy reported.  She continues to report a nonproductive cough and mild dyspnea.  She is a chronic smoker and currently smokes on average half a pack per day.  My major concern is that this could be a second primary lung cancer as her other sites of diseases stable on current systemic therapy       02/03/2020 Endocrine/Hormone Therapy    Stop Tamoxifen, Add Fasoldex, continue Q3week Hercpetin     03/23/2020 - 02/09/2021 Chemotherapy    OP BREAST ADO-TRASTUZUMAB EMTANSINE  ado-trastuzumab emtansine 3.6 mg/kg IV on day 1, every 21 days     04/12/2021 -  Chemotherapy    Tucatinib + Capecitabine  OP TRASTUZUMAB (EVERY 21 DAYS)  trastuzumab 8 mg/kg IV LOADING, then 6 mg/kg IV MAINT, every 21 days     Brain metastases (CMS-HCC)   08/07/2017 Initial Diagnosis    Brain metastases (CMS-HCC)    Summary of Radiosurgery   Rx:7 brain met: 09/05/2017: 2,000/2,000 cGy  Sec:R Frontal: 09/05/2017: 2,000 cGy  Sec:L Post Fr: 09/05/2017: 2,000 cGy  Sec:L Ant Fro: 09/05/2017: 2,000 cGy  Sec:R Sup Oc: 09/05/2017: 2,000 cGy  Sec:L Occipita: 09/05/2017: 2,000 cGy  Sec:R Inf Occi: 09/05/2017: 2,000 cGy  Sec:L Tempor: 09/05/2017: 2,000 cGy  Rx:Lt Med Oc: 09/13/2018: 2,000/2,000 cGy  Rx:Rt Frontal: : 2,500/2,500 cGy    01/15/2019: Craniotomy and resection of right posterior frontal lobe metastasis. Followed by [facility]    03/04/19: KPS 80; MRI with postsurgical changes versus residual disease; RTC in 2 weeks w/ MRI     01/03/2020 -  Cancer Staged    CT CAP 01/03/2020 which showed a new lingular opacity measuring 1.9 cm in the right lung. There was also slight increase in the right breast mass from 1.2-> 1.5 cm.  Bone scan shows no osseous metastases. Overall I considered these findings indeterminate and ppted to repeat CT CAP in 1 month     02/03/2020 -  Cancer Staged    CT chest 02/03/20 which showed that the lingular lesion has persisted and increased in size.  No mediastinal adenopathy reported.  She continues to report a nonproductive cough and mild dyspnea.  She is a chronic smoker and currently smokes on average half a pack per day.  My major concern is that this could be a second primary lung cancer as her other sites of diseases stable on current systemic therapy       02/03/2020 Endocrine/Hormone Therapy    Stop Tamoxifen, Add Fasoldex, continue Q3week Hercpetin     Metastatic breast cancer (CMS-HCC)   01/25/2019 Initial Diagnosis    Metastatic breast cancer (CMS-HCC)     03/23/2020 - 02/09/2021 Chemotherapy    OP BREAST ADO-TRASTUZUMAB EMTANSINE  ado-trastuzumab emtansine 3.6 mg/kg IV on day 1, every 21 days     04/12/2021 -  Chemotherapy    Tucatinib + Capecitabine  OP TRASTUZUMAB (EVERY 21 DAYS)  trastuzumab 8 mg/kg IV LOADING, then 6 mg/kg IV MAINT, every 21 days           Physical Exam:  Deferred for virtual visit        Radiology  Results for orders placed during the hospital encounter of 11/17/21    MRI Brain W Wo Contrast    Impression  Postsurgical changes  right frontal craniotomy for metastasis resection.  Stable metastasis in the left frontal lobe.  No new metastases identified.          Electronically signed by:  Raliegh Ip, MD  Radiation Oncology PGY-3  [facility]  11/17/2021  4:07 PM           ATTENDING ATTESTATION  This was a telehealth service where a resident was involved. As the attending physician, I spent 8 minutes in medical discussion with the patient via phone, participating in the key portions of the service.I spent an additional 5 minutes on pre- and post-visit activities which were specific to the patient and included reviewing the patient???s medical records, lab results, imaging results, and other pertinent records. I reviewed the resident's note and I agree with the resident's findings and plan.    Mina Marble, MD, PhD  Assistant Professor  Department of Radiation Oncology  [facility]  [id number]  [place], [place] [address]  O: [phone]  P: [phone]

## 2021-11-21 MED ORDER — CLONAZEPAM 0.5 MG TABLET
ORAL_TABLET | 0 refills | 0 days | Status: CP
Start: 2021-11-21 — End: ?

## 2021-11-24 NOTE — Unmapped (Signed)
[facility] Refill Coordination Note    Specialty Medication(s) to be Shipped:   Hematology/Oncology: Brooke [name] 50mg  + 150mg  and Capecitabine 500mg , directions: Take 1 tablet (500 mg total) by mouth two (2) times a day, continuously.    Other medication(s) to be shipped: No additional medications requested for fill at this time     Brooke [name], DOB: September 10, 1956  Phone: [phone] (home)       All above HIPAA information was verified with patient.     Was a Nurse, learning disability used for this call? No    Completed refill call assessment today to schedule patient's medication shipment from the [facility] [phone]).  All relevant notes have been reviewed.     Specialty medication(s) and dose(s) confirmed: Regimen is correct and unchanged.   Changes to medications: Brooke [name] reports stopping the following medications: Jardiance and Diclofenac  Changes to insurance: No  New side effects reported not previously addressed with a pharmacist or physician: None reported  Questions for the pharmacist: No    Confirmed patient received a Conservation officer, historic buildings and a Surveyor, mining with first shipment. The patient will receive a drug information handout for each medication shipped and additional FDA Medication Guides as required.       DISEASE/MEDICATION-SPECIFIC INFORMATION        N/A    SPECIALTY MEDICATION ADHERENCE     Medication Adherence    Patient reported X missed doses in the last month: 0  Specialty Medication: TUKYSA 50 mg tablet (tucatinib)  Patient is on additional specialty medications: Yes  Additional Specialty Medications: capecitabine 500 MG tablet (XELODA)  Patient Reported Additional Medication X Missed Doses in the Last Month: 0  Patient is on more than two specialty medications: Yes  Specialty Medication: TUKYSA 150 mg tablet (tucatinib)  Patient Reported Additional Medication X Missed Doses in the Last Month: 0  Informant: patient              Were doses missed due to medication being on hold? No    capecitabine 500 MG: 2 days of medicine on hand   Tukysa 150 mg: 2 days of medicine on hand   Tukysa 50 mg: 2 days of medicine on hand       REFERRAL TO PHARMACIST     Referral to the pharmacist: Not needed      [facility]     Shipping address confirmed in Epic.     Delivery Scheduled: Yes, Expected medication delivery date: 11/25/21.     Medication will be delivered via Same Day Courier to the prescription address in Epic [place].    Brooke [name]   [facility] Specialty Technician

## 2021-11-25 MED FILL — CAPECITABINE 500 MG TABLET: ORAL | 30 days supply | Qty: 60 | Fill #3

## 2021-11-25 MED FILL — TUKYSA 50 MG TABLET: ORAL | 30 days supply | Qty: 120 | Fill #2

## 2021-11-25 MED FILL — TUKYSA 150 MG TABLET: ORAL | 30 days supply | Qty: 60 | Fill #1

## 2021-11-26 ENCOUNTER — Ambulatory Visit: Admit: 2021-11-26 | Discharge: 2021-11-27 | Payer: MEDICARE

## 2021-11-26 DIAGNOSIS — K900 Celiac disease: Principal | ICD-10-CM

## 2021-11-26 DIAGNOSIS — R110 Nausea: Principal | ICD-10-CM

## 2021-11-26 DIAGNOSIS — C50919 Malignant neoplasm of unspecified site of unspecified female breast: Principal | ICD-10-CM

## 2021-11-26 DIAGNOSIS — C50811 Malignant neoplasm of overlapping sites of right female breast: Principal | ICD-10-CM

## 2021-11-26 MED ADMIN — heparin, porcine (PF) 100 unit/mL injection 500 Units: 500 [IU] | INTRAVENOUS | @ 19:00:00 | Stop: 2021-11-26

## 2021-11-26 MED ADMIN — trastuzumab (HERCEPTIN) 340 mg in sodium chloride (NS) 0.9 % 250 mL IVPB: 6 mg/kg | INTRAVENOUS | @ 19:00:00 | Stop: 2021-11-26

## 2021-11-26 MED ADMIN — sodium chloride (NS) 0.9 % infusion: 100 mL/h | INTRAVENOUS | @ 19:00:00 | Stop: 2021-11-26

## 2021-11-26 NOTE — Unmapped (Signed)
Patient present in infusion center for chemotherapy. VS taken. No labs ordered, port accessed and flushed with brisk blood return.     Patient received Trastuzumab per treatment plan without complications.     AVS declined. Port heparin locked and de-accessed. Patient left infusion center in stable ambulatory condition.

## 2021-11-29 NOTE — Unmapped (Signed)
Pharmacist Phone Follow-Up    Cancer Team  Medical Oncology: Dr. [name]  Reason for call: Oral chemotherapy management  Current treatment: Trastuzumab + tucatininb + capecitabine     Breast cancer:  Ms. Mclear is a 65 yo woman with HER2+ metastatic breast cancer who started trastuzumab, tucatinib, and capecitabine on 4/18.      Ms. Clegg is tolerating capecitabine and tucatinib well with no recent episodes of N/V or diarrhea.  She reports no new adverse effects.  She has diarrhea 1-2 times a week but reports that it is tolerable.  She has mild nausea and has ondansetron prn.    Plan:  1. Continue tucatinib 250 mg BID  2. Continue capecitabine 500 mg BID continuous  3. Prochlorperazine prn nausea/vomiting  4. Imodium/Lomotil prn diarrhea  5. Next trastuzumab infusion, provider appointmen and labs on 12/30  ________________________________________________________________________     Interval History   Ms.Coiro is a 65 y.o. female with metastatic breast cancer who I am calling to follow-up on tolerance to tucatinib and capecitabine.  She reports tolerating these medications well.  She has diarrhea 1-2 times per week but feels that this is tolerable. She intermittent nausea but it is mild and she usually does not require ondansetron.  She had not had any new adverse effects.    She verbalizes taking the appropriate dose of both medications.    Ms. Johal states that she cooks daily and has been burning her right hand on the pain nearly every day.  Her right hand has areas of redness and blisters where she has accidentally touched the pain.  She denies symptoms of HFS on her palms or her feet.  I recommended that she use a microwave or eat foods that don't need to be heated in a skillet     Oral chemotherapy regimen:??   Starting 7/12:   Tucatinib 250 mg BID   ????????????????????????Capecitabine 500 mg BID, continuously (metronomic dosing to improve adherence)  ????????????????????????Trastuzumab IV q 3weeks     Starting 5/30 doses reduced to:   Tucatinib 250 mg BID (started on 6/5) --> she took 150 mg po BID (5/30-6/4) then increased on 6/5 to 250 mg after receiving 50  mg tabs from the pharmacy  ????????????????????????Capecitabine 1000 mg BID x 14 days then 7 days off  ????????????????????????Trastuzumab IV q 3weeks     Started 04/12/21:  ????????????????????????Tucatinib 300 mg po BID continuous  ????????????????????????Capecitabine 1500 mg BID x 14 days then 7 days off (stopped during first cycle d/t diarrhea)  ????????????????????????Trastuzumab IV q 3weeks  Start date: 04/12/21  Pharmacy: [facility]     Adherence: Denies missed doses    Adverse Effects:   1. Diarrhea - 1-2 times per week, uses Imodium/Lomotil prn  2. Nausea - mild; intermittent, uses prochlorperazine prn    Drug interactions: Tucatinib is a strong CYP3A4 inhibitor.  It can potentially increase the concentrations of the following medications, requiring a dose reduction.  Will monitor.  1. Bupenorphine  2. Clonazepam  3. Trazodone    Patient verbalized understanding of the above information.    Breast Oncology    Breast Oncology Metrics:         Chemotherapy Dose: Dose documented     Chemotherapy Schedule: Schedule documented     NCI CTCAE: Nausea/Vomiting - None/Grade 0, Diarrhea - None/Grade 0     Comments: No interventions            I spent 15 minutes on the phone with the patient  on the date of service. I spent an additional 10 minutes on pre- and post-visit activities.     The patient was physically located in [place] or a state in which I am permitted to provide care. The patient and/or parent/guardian understood that s/he may incur co-pays and cost sharing, and agreed to the telemedicine visit. The visit was reasonable and appropriate under the circumstances given the patient's presentation at the time.    The patient and/or parent/guardian has been advised of the potential risks and limitations of this mode of treatment (including, but not limited to, the absence of in-person examination) and has agreed to be treated using telemedicine. The patient's/patient's family's questions regarding telemedicine have been answered.     If the visit was completed in an ambulatory setting, the patient and/or parent/guardian has also been advised to contact their provider???s office for worsening conditions, and seek emergency medical treatment and/or call 911 if the patient deems either necessary.        Oncology History Overview Note   Identifying Statement:  Lubertha Leite is a 65 y.o. female diagnosed with a right posterior frontal lobe metastasis (breast primary) status post resection 01/15/2019 and 5/5 fractions of SBRT (2500 Gy via CyberKnife) to the resection cavity.    Treatment History:  09/05/17: S/p SRS to 7 lesions  01/15/19: S/p resection, followed by SRS 2500 cGy   03/04/19: KPS 80; MRI with postsurgical changes versus residual disease; RTC in 2 weeks w/ MRI   04/15/19: KPS NA; MRI w/ SD; no study; RTC 6 weeks w/ MRI   06/10/19: KPS 80; MRI w/ SD; RTC 4 weeks  07/15/19: KPS 80; MRI w/ SD; RTC 8 weeks  08/19/19: KPS 80; start nortriptyline for HAs; RTC 4 weeks w/ MRI  09/16/19: KPS 80; con't nortriptyline for HAs; RTC 2 mos w/ MRI  11/11/19: KPS 80; MRI w/ SD though w/ small infarct; proceed w/ CVA workup; start 81mg  ASA; encouraged smoking cessation; con't nortriptyline; RTC to discuss results  11/25/19: KPS 80; discussed CVA workup results; MRA unremarkable; con't smoking cessation efforts; con't nortriptyline for HAs; con't 81mg  ASA, start Lipitor; f/u with PCP for COPD eval; RTC 2 mos w/ MRI     Malignant neoplasm of overlapping sites of right female breast (CMS-HCC)   2017 -  Presenting Symptoms    Large open wound RT breast which began as nipple inversion. Patient states that she ignored for a long time.  Physical exam of the area of concern in the RTbreast demonstrates a large open wound in the lateral right breast. Saw PCP 01/27/16 Rx Keflex.     02/04/2016 Interval Scan(s)    MMG/US: 3.1 (3.0) cm irregular spiculated dense mass in lateral Rt breast w nipple and skin retraction. 2 subcentimeter irregular masses in the RUOQ  (10', 11') (possible satellite lesions). Large Rt axillary lymph node 1.7. Lt breast clear. BIRAD 5.     02/08/2016 Biopsy    US guided Rt. Axillary LN biopsy:  Gr 3, IDC with apocrine features. ER (-), PR (31-40), HER-2 (3+). Noted to have multiple skin lesions. Poor access to breast mass due open 10-15 cm wound risk of pain and bleeding.     02/09/2016 Initial Diagnosis    Malignant neoplasm of overlapping sites of right female breast (RAF-HCC)     02/10/2016 Interval Scan(s)    CT CAP: Large, necrotic right breast mass with wide open tract to the skin, consistent with known right breast malignancy.  Numerous enlarged right  axillary, subpectoral, mediastinal, bilateral hilar lymph nodes and innumerable bilateral pulmonary nodules     02/10/2016 Interval Scan(s)    NM Bone scan: No osseous metastatic disease.     04/28/2016 - 03/10/2020 Chemotherapy    OP TRASTUZUMAB (EVERY 21 DAYS)  Trastuzumab 8 mg/kg loading then 6 mg/kg every 21 days     07/13/2016 Genetics    STRATA  ??? ERBB2 copy number alteration  Estimated copy number: 40, confidence interval: 35.7 - 44.0, cellularity: 50%  Associated FDA-approved targeted therapies in breast cancer: trastuzumab, lapatinib, ado-trastuzumab emtansine, pertuzumab  ??? PIK3CA [id number]     01/03/2020 -  Cancer Staged    CT CAP 01/03/2020 which showed a new lingular opacity measuring 1.9 cm in the right lung. There was also slight increase in the right breast mass from 1.2-> 1.5 cm.  Bone scan shows no osseous metastases. Overall I considered these findings indeterminate and ppted to repeat CT CAP in 1 month     02/03/2020 -  Cancer Staged    CT chest 02/03/20 which showed that the lingular lesion has persisted and increased in size.  No mediastinal adenopathy reported.  She continues to report a nonproductive cough and mild dyspnea.  She is a chronic smoker and currently smokes on average half a pack per day.  My major concern is that this could be a second primary lung cancer as her other sites of diseases stable on current systemic therapy       02/03/2020 Endocrine/Hormone Therapy    Stop Tamoxifen, Add Fasoldex, continue Q3week Hercpetin     03/23/2020 - 02/09/2021 Chemotherapy    OP BREAST ADO-TRASTUZUMAB EMTANSINE  ado-trastuzumab emtansine 3.6 mg/kg IV on day 1, every 21 days     04/12/2021 -  Chemotherapy    Tucatinib + Capecitabine  OP TRASTUZUMAB (EVERY 21 DAYS)  trastuzumab 8 mg/kg IV LOADING, then 6 mg/kg IV MAINT, every 21 days     Brain metastases (CMS-HCC)   08/07/2017 Initial Diagnosis    Brain metastases (CMS-HCC)    Summary of Radiosurgery   Rx:7 brain met: 09/05/2017: 2,000/2,000 cGy  Sec:R Frontal: 09/05/2017: 2,000 cGy  Sec:L Post Fr: 09/05/2017: 2,000 cGy  Sec:L Ant Fro: 09/05/2017: 2,000 cGy  Sec:R Sup Oc: 09/05/2017: 2,000 cGy  Sec:L Occipita: 09/05/2017: 2,000 cGy  Sec:R Inf Occi: 09/05/2017: 2,000 cGy  Sec:L Tempor: 09/05/2017: 2,000 cGy  Rx:Lt Med Oc: 09/13/2018: 2,000/2,000 cGy  Rx:Rt Frontal: : 2,500/2,500 cGy    01/15/2019: Craniotomy and resection of right posterior frontal lobe metastasis. Followed by [facility]    03/04/19: KPS 80; MRI with postsurgical changes versus residual disease; RTC in 2 weeks w/ MRI     01/03/2020 -  Cancer Staged    CT CAP 01/03/2020 which showed a new lingular opacity measuring 1.9 cm in the right lung. There was also slight increase in the right breast mass from 1.2-> 1.5 cm.  Bone scan shows no osseous metastases. Overall I considered these findings indeterminate and ppted to repeat CT CAP in 1 month     02/03/2020 -  Cancer Staged    CT chest 02/03/20 which showed that the lingular lesion has persisted and increased in size.  No mediastinal adenopathy reported.  She continues to report a nonproductive cough and mild dyspnea.  She is a chronic smoker and currently smokes on average half a pack per day.  My major concern is that this could be a second primary lung cancer as her other sites of diseases stable on  current systemic therapy       02/03/2020 Endocrine/Hormone Therapy    Stop Tamoxifen, Add Fasoldex, continue Q3week Hercpetin     Metastatic breast cancer (CMS-HCC)   01/25/2019 Initial Diagnosis    Metastatic breast cancer (CMS-HCC)     03/23/2020 - 02/09/2021 Chemotherapy    OP BREAST ADO-TRASTUZUMAB EMTANSINE  ado-trastuzumab emtansine 3.6 mg/kg IV on day 1, every 21 days     04/12/2021 -  Chemotherapy    Tucatinib + Capecitabine  OP TRASTUZUMAB (EVERY 21 DAYS)  trastuzumab 8 mg/kg IV LOADING, then 6 mg/kg IV MAINT, every 21 days          Pertinent Labs:   No visits with results within 1 Day(s) from this visit.   Latest known visit with results is:   Clinical Support on 10/19/2021   Component Date Value Ref Range Status   ??? Sodium 10/19/2021 142  135 - 145 mmol/L Final   ??? Potassium 10/19/2021 2.9 (L)  3.4 - 4.8 mmol/L Final   ??? Chloride 10/19/2021 108 (H)  98 - 107 mmol/L Final   ??? CO2 10/19/2021 24.0  20.0 - 31.0 mmol/L Final   ??? Anion Gap 10/19/2021 10  5 - 14 mmol/L Final   ??? BUN 10/19/2021 7 (L)  9 - 23 mg/dL Final   ??? Creatinine 10/19/2021 0.62  0.60 - 0.80 mg/dL Final   ??? BUN/Creatinine Ratio 10/19/2021 11   Final   ??? eGFR CKD-EPI (2021) Female 10/19/2021 >90  >=60 mL/min/1.62m2 Final    eGFR calculated with CKD-EPI 2021 equation in accordance with [employer] and [employer] of Nephrology Task Force recommendations.   ??? Glucose 10/19/2021 123  70 - 179 mg/dL Final   ??? Calcium 16/09/9603 8.7  8.7 - 10.4 mg/dL Final   ??? Albumin 54/08/8118 3.7  3.4 - 5.0 g/dL Final   ??? Total Protein 10/19/2021 6.9  5.7 - 8.2 g/dL Final   ??? Total Bilirubin 10/19/2021 0.5  0.3 - 1.2 mg/dL Final   ??? AST 14/78/2956 39 (H)  <=34 U/L Final   ??? ALT 10/19/2021 21  10 - 49 U/L Final   ??? Alkaline Phosphatase 10/19/2021 212 (H)  46 - 116 U/L Final   ??? WBC 10/19/2021 5.3  3.6 - 11.2 10*9/L Final   ??? RBC 10/19/2021 3.66 (L)  3.95 - 5.13 10*12/L Final   ??? HGB 10/19/2021 13.3  11.3 - 14.9 g/dL Final   ??? HCT 21/30/8657 38.6  34.0 - 44.0 % Final   ??? MCV 10/19/2021 105.5 (H)  77.6 - 95.7 fL Final   ??? MCH 10/19/2021 36.3 (H)  25.9 - 32.4 pg Final   ??? MCHC 10/19/2021 34.4  32.0 - 36.0 g/dL Final   ??? RDW 84/69/6295 18.5 (H)  12.2 - 15.2 % Final   ??? MPV 10/19/2021 7.4  6.8 - 10.7 fL Final   ??? Platelet 10/19/2021 162  150 - 450 10*9/L Final   ??? nRBC 10/19/2021 0  <=4 /100 WBCs Final   ??? Neutrophils % 10/19/2021 52.4  % Final   ??? Lymphocytes % 10/19/2021 37.5  % Final   ??? Monocytes % 10/19/2021 8.5  % Final   ??? Eosinophils % 10/19/2021 1.2  % Final   ??? Basophils % 10/19/2021 0.4  % Final   ??? Absolute Neutrophils 10/19/2021 2.8  1.8 - 7.8 10*9/L Final   ??? Absolute Lymphocytes 10/19/2021 2.0  1.1 - 3.6 10*9/L Final   ??? Absolute Monocytes 10/19/2021 0.5  0.3 - 0.8 10*9/L Final   ??? Absolute Eosinophils 10/19/2021 0.1  0.0 - 0.5 10*9/L Final   ??? Absolute Basophils 10/19/2021 0.0  0.0 - 0.1 10*9/L Final   ??? Anisocytosis 10/19/2021 Slight (A)  Not Present Final       Current medications:     Current Outpatient Medications   Medication Sig Dispense Refill   ??? albuterol HFA 90 mcg/actuation inhaler Inhale 2 puffs every six (6) hours as needed for wheezing.     ??? aluminum-magnesium hydroxide-simethicone 200-200-20 mg/5 mL Susp 80 mL, diphenhydrAMINE 12.5 mg/5 mL Liqd 200 mg, nystatin 100,000 unit/mL Susp 8,000,000 Units, distilled water Liqd 80 mL, lidocaine 2% viscous 2 % Soln 80 mL Take 5 mL by mouth every six (6) hours as needed. Swish, gargle, spit or swallow if throat issues 80 mL 2   ??? capecitabine (XELODA) 500 MG tablet Take 1 tablet (500 mg total) by mouth two (2) times a day, continuously. 60 tablet 5   ??? clobetasoL (TEMOVATE) 0.05 % ointment Apply twice a day to rash on palm until smooth/clear. 30 g 1   ??? clonazePAM (KLONOPIN) 0.5 MG tablet Take 0.5 mg daily as needed for anxiety.  Do not exceed 0.5 mg/day. 30 tablet 0   ??? diclofenac (VOLTAREN) 50 MG EC tablet Take 1 tablet (50 mg total) by mouth two (2) times a day as needed. With food 60 tablet 1   ??? diclofenac sodium (VOLTAREN) 1 % gel Apply 2 g topically four (4) times a day as needed for arthritis or pain. 150 g 2   ??? diphenoxylate-atropine (LOMOTIL) 2.5-0.025 mg per tablet Take 1 tablet by mouth 4 (four) times a day as needed for diarrhea. 30 tablet 1   ??? dorzolamide (TRUSOPT) 2 % ophthalmic solution Administer 1 drop into the left eye Three (3) times a day. 10 mL 12   ??? esomeprazole (NEXIUM) 40 MG capsule Take 1 capsule (40 mg total) by mouth daily. 90 capsule 3   ??? FARXIGA 5 mg Tab tablet Take 5 mg by mouth daily.     ??? HYDROcodone-acetaminophen (NORCO 10-325) 10-325 mg per tablet Take 1 tablet by mouth every twelve (12) hours as needed for pain. 60 tablet 0   ??? lidocaine (LIDODERM) 5 % patch Place 1 patch on the skin daily. Apply to affected area for 12 hours only each day (then remove patch) 30 patch 3   ??? lisinopriL-hydrochlorothiazide (PRINZIDE,ZESTORETIC) 20-25 mg per tablet Take 1 tablet by mouth daily. 90 tablet 3   ??? MAGNESIUM ORAL Take by mouth daily. OTC     ??? naloxone (NARCAN) 4 mg nasal spray One spray in either nostril once for known/suspected opioid overdose. May repeat every 2-3 minutes in alternating nostril til EMS arrives (Patient not taking: Reported on 11/11/2021) 2 each 0   ??? ondansetron (ZOFRAN) 4 MG tablet Take 1 tablet (4 mg total) by mouth every six (6) hours as needed for nausea. 30 tablet 1   ??? potassium chloride (KLOR-CON) 10 MEQ CR tablet Take 2 tablets (20 mEq total) by mouth Two (2) times a day. 360 tablet 0   ??? pregabalin (LYRICA) 200 MG capsule Take 1 capsule (200 mg total) by mouth Two (2) times a day. 60 capsule 2   ??? prochlorperazine (COMPAZINE) 10 MG tablet Take 1 tablet (10 mg total) by mouth every six (6) hours as needed for nausea. 30 tablet 6   ??? senna (SENOKOT) 8.6 mg tablet Take 3 tablets by mouth in  the morning. 30 tablet 2   ??? thiamine (VITAMIN B-1) 50 MG tablet Take 1 tablet (50 mg total) by mouth in the morning. 90 tablet 1   ??? traZODone (DESYREL) 50 MG tablet Take 1.5 tablets (75 mg total) by mouth nightly. 45 tablet 1   ??? tucatinib (TUKYSA) 150 mg tablet Take 1 tablet (150 mg total) by mouth two (2) times a day . 60 tablet 1   ??? tucatinib (TUKYSA) 50 mg tablet Take 2 tablets (100 mg total) by mouth two (2) times a day . 120 tablet 3   ??? venlafaxine (EFFEXOR-XR) 150 MG 24 hr capsule Take 2 capsules (300 mg total) by mouth daily. 60 capsule 2     No current facility-administered medications for this visit.

## 2021-12-06 DIAGNOSIS — E876 Hypokalemia: Principal | ICD-10-CM

## 2021-12-06 DIAGNOSIS — K219 Gastro-esophageal reflux disease without esophagitis: Principal | ICD-10-CM

## 2021-12-06 MED ORDER — ESOMEPRAZOLE MAGNESIUM 40 MG CAPSULE,DELAYED RELEASE
ORAL_CAPSULE | Freq: Every day | ORAL | 0 refills | 90 days
Start: 2021-12-06 — End: ?

## 2021-12-06 MED ORDER — POTASSIUM CHLORIDE ER 10 MEQ TABLET,EXTENDED RELEASE
ORAL_TABLET | 0 refills | 0 days
Start: 2021-12-06 — End: ?

## 2021-12-07 MED ORDER — ESOMEPRAZOLE MAGNESIUM 40 MG CAPSULE,DELAYED RELEASE
ORAL_CAPSULE | Freq: Every day | ORAL | 3 refills | 90 days | Status: CP
Start: 2021-12-07 — End: ?

## 2021-12-08 DIAGNOSIS — C50811 Malignant neoplasm of overlapping sites of right female breast: Principal | ICD-10-CM

## 2021-12-08 MED ORDER — TUCATINIB 150 MG TABLET
ORAL_TABLET | Freq: Two times a day (BID) | ORAL | 1 refills | 30 days | Status: CP
Start: 2021-12-08 — End: ?
  Filled 2021-12-17: qty 60, 30d supply, fill #0

## 2021-12-13 NOTE — Unmapped (Signed)
Hi,     Moet contacted the [employer] regarding the following:    - States that she needs to speak with you about her chemo dose.she missed a dose     Please contact Zakaiya at [phone].    Thanks in advance,    [name]  [facility]   [phone]

## 2021-12-14 NOTE — Unmapped (Signed)
[facility] Clinical Assessment & Refill Coordination Note    Brooke [name], DOB: [birth year]  Phone: [phone] (home)     All above HIPAA information was verified with patient's family member, sister [name].     Was a Nurse, learning disability used for this call? No    Specialty Medication(s):   Hematology/Oncology: Tukysa 50 and 150mg      Current Outpatient Medications   Medication Sig Dispense Refill   ??? albuterol HFA 90 mcg/actuation inhaler Inhale 2 puffs every six (6) hours as needed for wheezing.     ??? aluminum-magnesium hydroxide-simethicone 200-200-20 mg/5 mL Susp 80 mL, diphenhydrAMINE 12.5 mg/5 mL Liqd 200 mg, nystatin 100,000 unit/mL Susp 8,000,000 Units, distilled water Liqd 80 mL, lidocaine 2% viscous 2 % Soln 80 mL Take 5 mL by mouth every six (6) hours as needed. Swish, gargle, spit or swallow if throat issues 80 mL 2   ??? capecitabine (XELODA) 500 MG tablet Take 1 tablet (500 mg total) by mouth two (2) times a day, continuously. 60 tablet 5   ??? clobetasoL (TEMOVATE) 0.05 % ointment Apply twice a day to rash on palm until smooth/clear. 30 g 1   ??? clonazePAM (KLONOPIN) 0.5 MG tablet Take 0.5 mg daily as needed for anxiety.  Do not exceed 0.5 mg/day. 30 tablet 0   ??? diclofenac (VOLTAREN) 50 MG EC tablet Take 1 tablet (50 mg total) by mouth two (2) times a day as needed. With food 60 tablet 1   ??? diclofenac sodium (VOLTAREN) 1 % gel Apply 2 g topically four (4) times a day as needed for arthritis or pain. 150 g 2   ??? diphenoxylate-atropine (LOMOTIL) 2.5-0.025 mg per tablet Take 1 tablet by mouth 4 (four) times a day as needed for diarrhea. 30 tablet 1   ??? dorzolamide (TRUSOPT) 2 % ophthalmic solution Administer 1 drop into the left eye Three (3) times a day. 10 mL 12   ??? esomeprazole (NEXIUM) 40 MG capsule Take 1 capsule (40 mg total) by mouth in the morning. 90 capsule 3   ??? FARXIGA 5 mg Tab tablet Take 5 mg by mouth daily.     ??? HYDROcodone-acetaminophen (NORCO 10-325) 10-325 mg per tablet Take 1 tablet by mouth every twelve (12) hours as needed for pain. 60 tablet 0   ??? lidocaine (LIDODERM) 5 % patch Place 1 patch on the skin daily. Apply to affected area for 12 hours only each day (then remove patch) 30 patch 3   ??? lisinopriL-hydrochlorothiazide (PRINZIDE,ZESTORETIC) 20-25 mg per tablet Take 1 tablet by mouth daily. 90 tablet 3   ??? MAGNESIUM ORAL Take by mouth daily. OTC     ??? naloxone (NARCAN) 4 mg nasal spray One spray in either nostril once for known/suspected opioid overdose. May repeat every 2-3 minutes in alternating nostril til EMS arrives (Patient not taking: Reported on 11/11/2021) 2 each 0   ??? ondansetron (ZOFRAN) 4 MG tablet Take 1 tablet (4 mg total) by mouth every six (6) hours as needed for nausea. 30 tablet 1   ??? potassium chloride (KLOR-CON) 10 MEQ CR tablet Take 2 tablets (20 mEq total) by mouth Two (2) times a day. 360 tablet 0   ??? pregabalin (LYRICA) 200 MG capsule Take 1 capsule (200 mg total) by mouth Two (2) times a day. 60 capsule 2   ??? prochlorperazine (COMPAZINE) 10 MG tablet Take 1 tablet (10 mg total) by mouth every six (6) hours as needed for nausea. 30  tablet 6   ??? senna (SENOKOT) 8.6 mg tablet Take 3 tablets by mouth in the morning. 30 tablet 2   ??? thiamine (VITAMIN B-1) 50 MG tablet Take 1 tablet (50 mg total) by mouth in the morning. 90 tablet 1   ??? traZODone (DESYREL) 50 MG tablet Take 1.5 tablets (75 mg total) by mouth nightly. 45 tablet 1   ??? tucatinib (TUKYSA) 150 mg tablet Take 1 tablet (150 mg total) by mouth two (2) times a day. 60 tablet 1   ??? tucatinib (TUKYSA) 50 mg tablet Take 2 tablets (100 mg total) by mouth two (2) times a day . 120 tablet 3   ??? venlafaxine (EFFEXOR-XR) 150 MG 24 hr capsule Take 2 capsules (300 mg total) by mouth daily. 60 capsule 2     No current facility-administered medications for this visit.        Changes to medications: Ahniyah reports no changes at this time.    Allergies   Allergen Reactions   ??? Adhesive Rash   ??? Decadron [Dexamethasone] Anxiety     Mania as well   ??? Suboxone [Buprenorphine-Naloxone] Hallucinations   ??? Tetracycline      Other reaction(s): Other (See Comments)   ??? Oxycodone      Night terrors   ??? Skin Protectants, Misc. Rash   ??? Tegaderm Adhesive-No Drug-Allergy Check Rash       Changes to allergies: No    SPECIALTY MEDICATION ADHERENCE     Tukysa 150 mg: 14 days of medicine on hand   Tukysa 50 mg: 14 days of medicine on hand     Medication Adherence    Patient reported X missed doses in the last month: 0  Specialty Medication: Tukysa          Specialty medication(s) dose(s) confirmed: Regimen is correct and unchanged.     Are there any concerns with adherence? No    Adherence counseling provided? Not needed    CLINICAL MANAGEMENT AND INTERVENTION      Clinical Benefit Assessment:    Do you feel the medicine is effective or helping your condition? Yes    Clinical Benefit counseling provided? Not needed    Adverse Effects Assessment:    Are you experiencing any side effects? No    Are you experiencing difficulty administering your medicine? No    Quality of Life Assessment:    Quality of Life      Oncology  1. What impact has your specialty medication had on the reduction of your daily pain or discomfort level?: None  2. On a scale of 1-10, how would you rate your ability to manage side effects associated with your specialty medication? (1=no issues, 10 = unable to take medication due to side effects): 1          How many days over the past month did your condition/medication  keep you from your normal activities? For example, brushing your teeth or getting up in the morning. 0    Have you discussed this with your provider? Not needed    Acute Infection Status:    Acute infections noted within Epic:  No active infections  Patient reported infection: None    Therapy Appropriateness:    Is therapy appropriate and patient progressing towards therapeutic goals? Yes, therapy is appropriate and should be continued    DISEASE/MEDICATION-SPECIFIC INFORMATION      N/A    PATIENT SPECIFIC NEEDS     - Does the patient have any  physical, cognitive, or cultural barriers? No    - Is the patient high risk? Yes, patient is taking oral chemotherapy. Appropriateness of therapy as been assessed    - Does the patient require a Care Management Plan? No           SHIPPING     Specialty Medication(s) to be Shipped:   Hematology/Oncology: [name] 50 and 150mg     Other medication(s) to be shipped: No additional medications requested for fill at this time     Changes to insurance: No    Delivery Scheduled: Yes, Expected medication delivery date: 12/17/21.     Medication will be delivered via Same Day Courier to the confirmed prescription address in [facility].    The patient will receive a drug information handout for each medication shipped and additional FDA Medication Guides as required.  Verified that patient has previously received a Conservation officer, historic buildings and a Surveyor, mining.    The patient or caregiver noted above participated in the development of this care plan and knows that they can request review of or adjustments to the care plan at any time.      All of the patient's questions and concerns have been addressed.    Rollen Sox   [facility] Specialty Pharmacist

## 2021-12-14 NOTE — Unmapped (Signed)
Returned called from patient where they reported a missed dose of chemotherapy.  I left my phone number and general advice for missed doses (without leaving the names of the drugs on a machine).  With either tucatinib or capecitabine patient should just skip the dose and move ahead to the next dose unless they remember within a few hours of their scheduled time.  One should not try to catch-up with a double dose.

## 2021-12-14 NOTE — Unmapped (Signed)
[facility] [address] [facility] Clinical Assessment & Refill Coordination Note    Brooke [name], DOB: 06-17-1956  Phone: [phone] (home)     All above HIPAA information was verified with patient's family member, sister Brooke [name].     Was a Nurse, learning disability used for this call? No    Specialty Medication(s):   Hematology/Oncology: Capecitabine 500mg , directions: 500mg  2 times a day continuously     Current Outpatient Medications   Medication Sig Dispense Refill   ??? albuterol HFA 90 mcg/actuation inhaler Inhale 2 puffs every six (6) hours as needed for wheezing.     ??? aluminum-magnesium hydroxide-simethicone 200-200-20 mg/5 mL Susp 80 mL, diphenhydrAMINE 12.5 mg/5 mL Liqd 200 mg, nystatin 100,000 unit/mL Susp 8,000,000 Units, distilled water Liqd 80 mL, lidocaine 2% viscous 2 % Soln 80 mL Take 5 mL by mouth every six (6) hours as needed. Swish, gargle, spit or swallow if throat issues 80 mL 2   ??? capecitabine (XELODA) 500 MG tablet Take 1 tablet (500 mg total) by mouth two (2) times a day, continuously. 60 tablet 5   ??? clobetasoL (TEMOVATE) 0.05 % ointment Apply twice a day to rash on palm until smooth/clear. 30 g 1   ??? clonazePAM (KLONOPIN) 0.5 MG tablet Take 0.5 mg daily as needed for anxiety.  Do not exceed 0.5 mg/day. 30 tablet 0   ??? diclofenac (VOLTAREN) 50 MG EC tablet Take 1 tablet (50 mg total) by mouth two (2) times a day as needed. With food 60 tablet 1   ??? diclofenac sodium (VOLTAREN) 1 % gel Apply 2 g topically four (4) times a day as needed for arthritis or pain. 150 g 2   ??? diphenoxylate-atropine (LOMOTIL) 2.5-0.025 mg per tablet Take 1 tablet by mouth 4 (four) times a day as needed for diarrhea. 30 tablet 1   ??? dorzolamide (TRUSOPT) 2 % ophthalmic solution Administer 1 drop into the left eye Three (3) times a day. 10 mL 12   ??? esomeprazole (NEXIUM) 40 MG capsule Take 1 capsule (40 mg total) by mouth in the morning. 90 capsule 3   ??? FARXIGA 5 mg Tab tablet Take 5 mg by mouth daily.     ??? HYDROcodone-acetaminophen (NORCO 10-325) 10-325 mg per tablet Take 1 tablet by mouth every twelve (12) hours as needed for pain. 60 tablet 0   ??? lidocaine (LIDODERM) 5 % patch Place 1 patch on the skin daily. Apply to affected area for 12 hours only each day (then remove patch) 30 patch 3   ??? lisinopriL-hydrochlorothiazide (PRINZIDE,ZESTORETIC) 20-25 mg per tablet Take 1 tablet by mouth daily. 90 tablet 3   ??? MAGNESIUM ORAL Take by mouth daily. OTC     ??? naloxone (NARCAN) 4 mg nasal spray One spray in either nostril once for known/suspected opioid overdose. May repeat every 2-3 minutes in alternating nostril til EMS arrives (Patient not taking: Reported on 11/11/2021) 2 each 0   ??? ondansetron (ZOFRAN) 4 MG tablet Take 1 tablet (4 mg total) by mouth every six (6) hours as needed for nausea. 30 tablet 1   ??? potassium chloride (KLOR-CON) 10 MEQ CR tablet Take 2 tablets (20 mEq total) by mouth Two (2) times a day. 360 tablet 0   ??? pregabalin (LYRICA) 200 MG capsule Take 1 capsule (200 mg total) by mouth Two (2) times a day. 60 capsule 2   ??? prochlorperazine (COMPAZINE) 10 MG tablet Take 1 tablet (10 mg total) by mouth every six (6) hours  as needed for nausea. 30 tablet 6   ??? senna (SENOKOT) 8.6 mg tablet Take 3 tablets by mouth in the morning. 30 tablet 2   ??? thiamine (VITAMIN B-1) 50 MG tablet Take 1 tablet (50 mg total) by mouth in the morning. 90 tablet 1   ??? traZODone (DESYREL) 50 MG tablet Take 1.5 tablets (75 mg total) by mouth nightly. 45 tablet 1   ??? tucatinib (TUKYSA) 150 mg tablet Take 1 tablet (150 mg total) by mouth two (2) times a day. 60 tablet 1   ??? tucatinib (TUKYSA) 50 mg tablet Take 2 tablets (100 mg total) by mouth two (2) times a day . 120 tablet 3   ??? venlafaxine (EFFEXOR-XR) 150 MG 24 hr capsule Take 2 capsules (300 mg total) by mouth daily. 60 capsule 2     No current facility-administered medications for this visit.        Changes to medications: Kashari reports no changes at this time.    Allergies   Allergen Reactions   ??? Adhesive Rash   ??? Decadron [Dexamethasone] Anxiety     Mania as well   ??? Suboxone [Buprenorphine-Naloxone] Hallucinations   ??? Tetracycline      Other reaction(s): Other (See Comments)   ??? Oxycodone      Night terrors   ??? Skin Protectants, Misc. Rash   ??? Tegaderm Adhesive-No Drug-Allergy Check Rash       Changes to allergies: No    SPECIALTY MEDICATION ADHERENCE     Capecitabine 500 mg: 14 days of medicine on hand   Medication Adherence    Patient reported X missed doses in the last month: 0  Specialty Medication: Capecitabine 500mg           Specialty medication(s) dose(s) confirmed: Regimen is correct and unchanged.     Are there any concerns with adherence? No    Adherence counseling provided? Not needed    CLINICAL MANAGEMENT AND INTERVENTION      Clinical Benefit Assessment:    Do you feel the medicine is effective or helping your condition? Yes    Clinical Benefit counseling provided? Not needed    Adverse Effects Assessment:    Are you experiencing any side effects? No    Are you experiencing difficulty administering your medicine? No    Quality of Life Assessment:    Quality of Life      Oncology  1. What impact has your specialty medication had on the reduction of your daily pain or discomfort level?: None  2. On a scale of 1-10, how would you rate your ability to manage side effects associated with your specialty medication? (1=no issues, 10 = unable to take medication due to side effects): 1            How many days over the past month did your condition/medication  keep you from your normal activities? For example, brushing your teeth or getting up in the morning. 0    Have you discussed this with your provider? Not needed    Acute Infection Status:    Acute infections noted within Epic:  No active infections  Patient reported infection: None    Therapy Appropriateness:    Is therapy appropriate and patient progressing towards therapeutic goals? Yes, therapy is appropriate and should be continued    DISEASE/MEDICATION-SPECIFIC INFORMATION      N/A    PATIENT SPECIFIC NEEDS     - Does the patient have any physical, cognitive, or cultural barriers?  No    - Is the patient high risk? Yes, patient is taking oral chemotherapy. Appropriateness of therapy as been assessed    - Does the patient require a Care Management Plan? No           SHIPPING     Specialty Medication(s) to be Shipped:   Hematology/Oncology: Capecitabine 500mg , directions: 500mg  2 times a day continously    Other medication(s) to be shipped: No additional medications requested for fill at this time     Changes to insurance: No    Delivery Scheduled: Yes, Expected medication delivery date: 12/17/21.     Medication will be delivered via Same Day Courier to the confirmed prescription address in [facility].    The patient will receive a drug information handout for each medication shipped and additional FDA Medication Guides as required.  Verified that patient has previously received a Conservation officer, historic buildings and a Surveyor, mining.    The patient or caregiver noted above participated in the development of this care plan and knows that they can request review of or adjustments to the care plan at any time.      All of the patient's questions and concerns have been addressed.    Rollen Sox   [facility] Shared [facility] Specialty Pharmacist

## 2021-12-17 MED FILL — CAPECITABINE 500 MG TABLET: ORAL | 30 days supply | Qty: 60 | Fill #4

## 2021-12-17 MED FILL — TUKYSA 50 MG TABLET: ORAL | 30 days supply | Qty: 120 | Fill #3

## 2021-12-22 NOTE — Unmapped (Deleted)
OUTPATIENT ONCOLOGY PALLIATIVE CARE    Principal Diagnosis: Brooke [name] is a 65 y.o. female with metastatic breast cancer,  diagnosed in 2017.  Disease sites include lung and brain.     Assessment/Plan:   1.Neuropathic pain in hands and feet-stable and continued mid upper back pain radiating to lower back appears to be arthritic-controlled with current regimen.  Hx of cervical thoracic paraspinal trigger point bilateral this past July 2022 with not as much relief as she has had in the past.     -Continue lyrica 200 mg bid dosing-relief with this.  -Continue hydrocodone 10/325 mg 1 tab 3 times a day as needed for pain. Encouraged to take as needed.   -Continue Effexor 225 mg every day  -Continue diclofenac gel as needed    Opioid use in past:  -Had difficulty tolerating the methadone 2.5 mg dosing due to sedation.  Did not try the 1 mg methadone, so could consider this in the future if needed for the neuropathic discomfort.  Some reluctance in re-starting methadone.  -No relief with tramadol  -oxycodone-night terrors.  -Suboxone-hallucinations    2.  Goals of care-not addressed today.     At prior visits: goals which are to decrease pain and to maintain her independence. Lives alone and Brooke [name] lives close by.     Wants to ensure that her quality of life remains high and the wish to keep on going.  Shared concern that she does not want to be in pain.  NP provided reassurance that our team can offer her different treatment strategies to help her with her goal.     Advance care planning-see advance care planning note dated 01/31/2020.  -at prior visits: Patient shared that she has been dreaming about death. She does have her funeral arrangements completed. Shared that she has her wishes written down and her Sister Brooke [name] knows where they are regarding her funeral. She still contemplating whether to be cremated or buried and she is leaning toward cremation. patient currently focused on cancer directed therapy.  Prefers to stay in the moment and not discussed things.  We will continue to support and address if she has a change in clinical condition.        -Advance directives scanned in on 11/02/19        HCDM Brooke [name]): Brooke [name] - Sister - [phone]    HCDM, First AlternateJaneece [name] - Sister - [phone]    At prior visits,   # Controlled substances risk management.  We are not currently prescribing controlled medications for her.   - Patient has a signed pain medication agreement with Outpt Palliative care, completed on 11/01/18, as per standard care. This was signed again on 01/28/19   - [place] database was reviewed today and it was appropriate.   - Urine drug screen was not performed at this visit. Findings: not applicable.   - Patient has received information about safe storage and administration of medications.   - Patient has received a prescription for narcan and shared with Brooke [name] and pt.       F/u: Last week of Dec    ----------------------------------------  Referring Provider: From inpatient oncology team  Oncology Team: Breast team-Dr. Archie [name]  PCP: Brooke Milliner, MD      HPI: 65 year old woman with her to overexpressing metastatic breast cancer to her lung and brain.  Was found to have multiple small brain metastases and completed CyberKnife therapy on September 11. Describes ongoing pain in her pelvis, rates it as a  3 out of 10 today.  Has been improving over the last week or 2.  Feels like gabapentin has been helpful, and is also responded well to Tylenol and ibuprofen in the past.  She is taken oxycodone and it gave her night terrors does not want to take it again.  Has taken Dilaudid previously and did not have side effects from this.    Current cancer-directed therapy: capecitabine (Xeloda), tucatinib, and trastuzumab (Herceptin).     Diagnosis of COVID (07/18/21)      Interval hx 11/17/21 NP and Brooke [name]    -Doing ok  -Still with aches and pains in upper spine. Not taking the hydrocodone that often-afraid to run out that often  -Neuropathy is ok with the lyrica  -Spending more time with her friend-Brooke [name]  -Still losing some wgt  -Mood is ok        Palliative Performance Scale: 70% - Ambulation: Reduced / unable to do normal work, some evidence of disease / Self-Care: Full / Intake: Normal or reduced / Level of Conscious: Full         Coping/Support Issues: Patient reports she is been able to attend church functions and is finding this very helpful. Has boyfriend Brooke [name] for the last 15 years.     At prior visits, patient did share that she continues to receive much help from her church friend, Brooke [name] and her husband.  They prayed together and she is found this supportive.  She states that her 2 sisters have been supportive, but she feels that they are becoming more less patient with her due to patient's irritability.     Overall coping well, no specific issues identified.  Finding support with her church community, her sister, Brooke [name], and prayer.         Social History: Pt lives in senior housing in [place] month, says it's ok and quiet but she is from [place] and liked it there more. She has 4 sisters, 2 nearby and involved in care. She has 1 son-Brooke [name], 35yo, who lives about an hour and a half away in [place]. She is divorced.  ??  Goes to [employer] in [place] and gets a lot of support. Her church friend Brooke [name] goes with her to chemo appts. Her sisters will go too.    Advance Care Planning: Advance directives scanned into system  HCPOA: See ACP note  Living Will: See ACP note  ACP note: Yes, advance directive scanned in on November 6    Objective     Opioid Risk Tool:      Opioid Risk Tool:   Female  Female    Family history of substance abuse      Alcohol   1  3    Illegal drugs  2  3    Rx drugs  4  4    Personal history of substance abuse      Alcohol  3  3    Illegal drugs  4  4    Rx drugs  5  5    Age between 16--45 years  1  1    History of preadolescent sexual abuse  3 0    Psychological disease      ADD, OCD, bipolar, schizophrenia  2  2    Depression  1  1    Total: 7  (<3 low risk, 4-7 moderate risk, >8 high risk)      Oncology History Overview Note   Identifying Statement:  Brooke [name] is a 65 y.o. female diagnosed with a right posterior frontal lobe metastasis (breast primary) status post resection 01/15/2019 and 5/5 fractions of SBRT (2500 Gy via CyberKnife) to the resection cavity.    Treatment History:  09/05/17: S/p SRS to 7 lesions  01/15/19: S/p resection, followed by SRS 2500 cGy   03/04/19: KPS 80; MRI with postsurgical changes versus residual disease; RTC in 2 weeks w/ MRI   04/15/19: KPS NA; MRI w/ SD; no study; RTC 6 weeks w/ MRI   06/10/19: KPS 80; MRI w/ SD; RTC 4 weeks  07/15/19: KPS 80; MRI w/ SD; RTC 8 weeks  08/19/19: KPS 80; start nortriptyline for HAs; RTC 4 weeks w/ MRI  09/16/19: KPS 80; con't nortriptyline for HAs; RTC 2 mos w/ MRI  11/11/19: KPS 80; MRI w/ SD though w/ small infarct; proceed w/ CVA workup; start 81mg  ASA; encouraged smoking cessation; con't nortriptyline; RTC to discuss results  11/25/19: KPS 80; discussed CVA workup results; MRA unremarkable; con't smoking cessation efforts; con't nortriptyline for HAs; con't 81mg  ASA, start Lipitor; f/u with PCP for COPD eval; RTC 2 mos w/ MRI     Malignant neoplasm of overlapping sites of right female breast (CMS-HCC)   2017 -  Presenting Symptoms    Large open wound RT breast which began as nipple inversion. Patient states that she ignored for a long time.  Physical exam of the area of concern in the RTbreast demonstrates a large open wound in the lateral right breast. Saw PCP 01/27/16 Rx Keflex.     02/04/2016 Interval Scan(s)    MMG/US: 3.1 (3.0) cm irregular spiculated dense mass in lateral Rt breast w nipple and skin retraction. 2 subcentimeter irregular masses in the RUOQ  (10', 11') (possible satellite lesions). Large Rt axillary lymph node 1.7. Lt breast clear. BIRAD 5.     02/08/2016 Biopsy    US guided Rt. Axillary LN biopsy:  Gr 3, IDC with apocrine features. ER (-), PR (31-40), HER-2 (3+). Noted to have multiple skin lesions. Poor access to breast mass due open 10-15 cm wound risk of pain and bleeding.     02/09/2016 Initial Diagnosis    Malignant neoplasm of overlapping sites of right female breast (RAF-HCC)     02/10/2016 Interval Scan(s)    CT CAP: Large, necrotic right breast mass with wide open tract to the skin, consistent with known right breast malignancy.  Numerous enlarged right axillary, subpectoral, mediastinal, bilateral hilar lymph nodes and innumerable bilateral pulmonary nodules     02/10/2016 Interval Scan(s)    NM Bone scan: No osseous metastatic disease.     04/28/2016 - 03/10/2020 Chemotherapy    OP TRASTUZUMAB (EVERY 21 DAYS)  Trastuzumab 8 mg/kg loading then 6 mg/kg every 21 days     07/13/2016 Genetics    STRATA  ??? ERBB2 copy number alteration  Estimated copy number: 40, confidence interval: 35.7 - 44.0, cellularity: 50%  Associated FDA-approved targeted therapies in breast cancer: trastuzumab, lapatinib, ado-trastuzumab emtansine, pertuzumab  ??? PIK3CA p.[id number]     01/03/2020 -  Cancer Staged    CT CAP 01/03/2020 which showed a new lingular opacity measuring 1.9 cm in the right lung. There was also slight increase in the right breast mass from 1.2-> 1.5 cm.  Bone scan shows no osseous metastases. Overall I considered these findings indeterminate and ppted to repeat CT CAP in 1 month     02/03/2020 -  Cancer Staged    CT chest 02/03/20 which  showed that the lingular lesion has persisted and increased in size.  No mediastinal adenopathy reported.  She continues to report a nonproductive cough and mild dyspnea.  She is a chronic smoker and currently smokes on average half a pack per day.  My major concern is that this could be a second primary lung cancer as her other sites of diseases stable on current systemic therapy       02/03/2020 Endocrine/Hormone Therapy    Stop Tamoxifen, Add Fasoldex, continue Q3week Hercpetin     03/23/2020 - 02/09/2021 Chemotherapy    OP BREAST ADO-TRASTUZUMAB EMTANSINE  ado-trastuzumab emtansine 3.6 mg/kg IV on day 1, every 21 days     04/12/2021 -  Chemotherapy    Tucatinib + Capecitabine  OP TRASTUZUMAB (EVERY 21 DAYS)  trastuzumab 8 mg/kg IV LOADING, then 6 mg/kg IV MAINT, every 21 days     Brain metastases (CMS-HCC)   08/07/2017 Initial Diagnosis    Brain metastases (CMS-HCC)    Summary of Radiosurgery   Rx:7 brain met: 09/05/2017: 2,000/2,000 cGy  Sec:R Frontal: 09/05/2017: 2,000 cGy  Sec:L Post Fr: 09/05/2017: 2,000 cGy  Sec:L Ant Fro: 09/05/2017: 2,000 cGy  Sec:R Sup Oc: 09/05/2017: 2,000 cGy  Sec:L Occipita: 09/05/2017: 2,000 cGy  Sec:R Inf Occi: 09/05/2017: 2,000 cGy  Sec:L Tempor: 09/05/2017: 2,000 cGy  Rx:Lt Med Oc: 09/13/2018: 2,000/2,000 cGy  Rx:Rt Frontal: : 2,500/2,500 cGy    01/15/2019: Craniotomy and resection of right posterior frontal lobe metastasis. Followed by [facility]    03/04/19: KPS 80; MRI with postsurgical changes versus residual disease; RTC in 2 weeks w/ MRI     01/03/2020 -  Cancer Staged    CT CAP 01/03/2020 which showed a new lingular opacity measuring 1.9 cm in the right lung. There was also slight increase in the right breast mass from 1.2-> 1.5 cm.  Bone scan shows no osseous metastases. Overall I considered these findings indeterminate and ppted to repeat CT CAP in 1 month     02/03/2020 -  Cancer Staged    CT chest 02/03/20 which showed that the lingular lesion has persisted and increased in size.  No mediastinal adenopathy reported.  She continues to report a nonproductive cough and mild dyspnea.  She is a chronic smoker and currently smokes on average half a pack per day.  My major concern is that this could be a second primary lung cancer as her other sites of diseases stable on current systemic therapy       02/03/2020 Endocrine/Hormone Therapy    Stop Tamoxifen, Add Fasoldex, continue Q3week Hercpetin     Metastatic breast cancer (CMS-HCC)   01/25/2019 Initial Diagnosis    Metastatic breast cancer (CMS-HCC)     03/23/2020 - 02/09/2021 Chemotherapy    OP BREAST ADO-TRASTUZUMAB EMTANSINE  ado-trastuzumab emtansine 3.6 mg/kg IV on day 1, every 21 days     04/12/2021 -  Chemotherapy    Tucatinib + Capecitabine  OP TRASTUZUMAB (EVERY 21 DAYS)  trastuzumab 8 mg/kg IV LOADING, then 6 mg/kg IV MAINT, every 21 days         Patient Active Problem List   Diagnosis   ??? Malignant neoplasm of overlapping sites of right female breast (CMS-HCC)   ??? Peripheral neuropathy   ??? Insomnia   ??? Brain metastases (CMS-HCC)   ??? Nausea   ??? Closed fracture of one rib with routine healing   ??? Diarrhea of presumed infectious origin   ??? Failure to thrive in adult   ??? Tobacco  use   ??? Chronic diarrhea   ??? Depression   ??? Hyponatremia   ??? Back pain   ??? Closed fracture of multiple pubic rami, right, initial encounter (CMS-HCC)   ??? Macrocytosis   ??? Pelvic fracture (CMS-HCC)   ??? Metastatic breast cancer (CMS-HCC)   ??? Migraine without aura and without status migrainosus, not intractable   ??? Essential hypertension   ??? CVA (cerebral vascular accident) (CMS-HCC)   ??? DDD (degenerative disc disease), cervical   ??? Wheezing   ??? Dyslipidemia   ??? Angina pectoris (CMS-HCC)   ??? Papillary fibroelastoma of heart   ??? Antineoplastic chemotherapy induced anemia   ??? Celiac disease   ??? Iron deficiency anemia due to chronic blood loss   ??? Dry eye syndrome, bilateral   ??? Meibomian gland dysfunction (MGD) of both eyes   ??? Glaucoma suspect of both eyes   ??? Incipient cataract of both eyes   ??? 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 Fatty liver       Past Medical History:   Diagnosis Date   ??? Abnormal ECG unsure   ??? Alcoholism (CMS-HCC)    ??? Allergic rhinitis    ??? Anemia    ??? Anxiety     insomnia   ??? Arthritis not sure   ??? Brain concussion    ??? Breast cancer (CMS-HCC)     chemo for now with mets   ??? COPD (chronic obstructive pulmonary disease) (CMS-HCC)    ??? Depression    ??? Dysphagia    ??? Fatty liver 07/08/2021   ??? Genitourinary disease    ??? GERD (gastroesophageal reflux disease)    ??? Headache    ??? HTN (hypertension)    ??? Hyperlipidemia    ??? Insomnia    ??? 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 Stroke (CMS-HCC) Nov. 2020    Dr. Theodoro Kalata   ??? Tobacco dependence        Past Surgical History:   Procedure Laterality Date   ??? CESAREAN SECTION     ??? CHOLECYSTECTOMY     ??? FINGER SURGERY Right     trigger finger   ??? GANGLION CYST EXCISION Left     hand   ??? HYSTERECTOMY     ??? LYMPH NODE BIOPSY Right 02/08/2016    Axillary   ??? PR BRNSCHSC TNDSC EBUS DX/TX INTERVENTION PERPH LES Left 03/05/2020    Procedure: Bronch, Rigid Or Flexible, Including Fluoro Guidance, When Performed; With Transendoscopic Ebus During Bronchoscopic Diagnostic Or Therapeutic Intervention(S) For Peripheral Lesion(S);  Surgeon: Jerelyn Charles, MD;  Location: MAIN OR [name];  Service: Pulmonary   ??? PR BRONCHOSCOPY,COMPUTER ASSIST/IMAGE-GUIDED NAVIGATION Left 03/05/2020    Procedure: BRONCHOSCOPY, RIGID OR FLEXIBLE, INCLUDE FLUORO WHEN PERFORMED; W/COMPUTER-ASSIST, IMAGE-GUIDED NAVIGATION;  Surgeon: Jerelyn Charles, MD;  Location: MAIN OR [name];  Service: Pulmonary   ??? PR BRONCHOSCOPY,DIAGNOSTIC W LAVAGE Left 03/05/2020    Procedure: Bronchoscopy, Rigid Or Flexible, Include Fluoroscopic Guidance When Performed; W/Bronchial Alveolar Lavage;  Surgeon: Jerelyn Charles, MD;  Location: MAIN OR [name];  Service: Pulmonary   ??? PR BRONCHOSCOPY,TRANSBRON ASPIR BX Left 03/05/2020    Procedure: Bronchoscopy, Rigid/Flex, Incl Fluoro; W/Transbronch Ndl Aspirat Bx, Trachea, Main Stem &/Or Lobar Bronchus;  Surgeon: Jerelyn Charles, MD;  Location: MAIN OR [facility];  Service: Pulmonary   ??? PR BRONCHOSCOPY,TRANSBRONCH BIOPSY Left 03/05/2020    Procedure: Bronchoscopy, Rigid/Flexible, Include Fluoro Guidance When Performed; W/Transbronchial Lung Bx, Single  Lobe;  Surgeon: Jerelyn Charles, MD;  Location: MAIN OR [name];  Service: Pulmonary   ??? PR CATH PLACE/CORON ANGIO, IMG SUPER/INTERP,W LEFT HEART VENTRICULOGRAPHY N/A 05/07/2020    Procedure: Left Heart Catheterization;  Surgeon: Lesle Reek, MD;  Location: [name] CATH;  Service: Cardiology   ??? PR COLONOSCOPY W/BIOPSY SINGLE/MULTIPLE N/A 10/21/2016    Procedure: COLONOSCOPY, FLEXIBLE, PROXIMAL TO SPLENIC FLEXURE; WITH BIOPSY, SINGLE OR MULTIPLE;  Surgeon: Monte Fantasia, MD;  Location: GI PROCEDURES [facility];  Service: Gastroenterology   ??? PR EXCIS SUPRATENT BRAIN TUMOR Right 01/15/2019    Procedure: CRANIECTOMY; EXC BRAIN TUMOR-SUPRATENTORIAL;  Surgeon: Edison Simon, MD;  Location: MAIN OR [facility];  Service: Neurosurgery   ??? PR INCISE FINGER TENDON SHEATH Left 06/17/2019    Procedure: R-20 TENDON SHEATH INCISION (EG, FOR TRIGGER FINGER);  Surgeon: Daisy Lazar, MD;  Location: ASC OR [name];  Service: Orthopedics   ??? PR MICROSURG TECHNIQUES,REQ OPER MICROSCOPE Right 01/15/2019    Procedure: MICROSURGICAL TECHNIQUES, REQUIRING USE OF OPERATING MICROSCOPE (LIST SEPARATELY IN ADDITION TO CODE FOR PRIMARY PROCEDURE);  Surgeon: Edison Simon, MD;  Location: MAIN OR [name];  Service: Neurosurgery   ??? PR STEREOTACTIC COMP ASSIST PROC,CRANIAL,INTRADURAL Right 01/15/2019    Procedure: STEREOTACTIC COMPUTER-ASSISTED (NAVIGATIONAL) PROCEDURE; CRANIAL, INTRADURAL;  Surgeon: Edison Simon, MD;  Location: MAIN OR [facility];  Service: Neurosurgery   ??? PR UPPER GI ENDOSCOPY,BIOPSY N/A 10/21/2016    Procedure: UGI ENDOSCOPY; WITH BIOPSY, SINGLE OR MULTIPLE;  Surgeon: Monte Fantasia, MD;  Location: GI PROCEDURES [facility];  Service: Gastroenterology   ??? PR UPPER GI ENDOSCOPY,BIOPSY N/A 04/02/2018    Procedure: UGI ENDOSCOPY; WITH BIOPSY, SINGLE OR MULTIPLE;  Surgeon: Wendall Papa, MD;  [name];  Service: Gastroenterology   ??? SKIN BIOPSY         Current Outpatient Medications   Medication Sig Dispense Refill   ??? albuterol HFA 90 mcg/actuation inhaler Inhale 2 puffs every six (6) hours as needed for wheezing.     ??? aluminum-magnesium hydroxide-simethicone 200-200-20 mg/5 mL Susp 80 mL, diphenhydrAMINE 12.5 mg/5 mL Liqd 200 mg, nystatin 100,000 unit/mL Susp 8,000,000 Units, distilled water Liqd 80 mL, lidocaine 2% viscous 2 % Soln 80 mL Take 5 mL by mouth every six (6) hours as needed. Swish, gargle, spit or swallow if throat issues 80 mL 2   ??? capecitabine (XELODA) 500 MG tablet Take 1 tablet (500 mg total) by mouth two (2) times a day, continuously. 60 tablet 5   ??? clobetasoL (TEMOVATE) 0.05 % ointment Apply twice a day to rash on palm until smooth/clear. 30 g 1   ??? clonazePAM (KLONOPIN) 0.5 MG tablet Take 0.5 mg daily as needed for anxiety.  Do not exceed 0.5 mg/day. 30 tablet 0   ??? diclofenac (VOLTAREN) 50 MG EC tablet Take 1 tablet (50 mg total) by mouth two (2) times a day as needed. With food 60 tablet 1   ??? diclofenac sodium (VOLTAREN) 1 % gel Apply 2 g topically four (4) times a day as needed for arthritis or pain. 150 g 2   ??? diphenoxylate-atropine (LOMOTIL) 2.5-0.025 mg per tablet Take 1 tablet by mouth 4 (four) times a day as needed for diarrhea. 30 tablet 1   ??? dorzolamide (TRUSOPT) 2 % ophthalmic solution Administer 1 drop into the left eye Three (3) times a day. 10 mL 12   ??? esomeprazole (NEXIUM) 40 MG capsule Take 1 capsule (40 mg total) by mouth in  the morning. 90 capsule 3   ??? FARXIGA 5 mg Tab tablet Take 5 mg by mouth daily.     ??? HYDROcodone-acetaminophen (NORCO 10-325) 10-325 mg per tablet Take 1 tablet by mouth every twelve (12) hours as needed for pain. 60 tablet 0   ??? lidocaine (LIDODERM) 5 % patch Place 1 patch on the skin daily. Apply to affected area for 12 hours only each day (then remove patch) 30 patch 3   ??? lisinopriL-hydrochlorothiazide (PRINZIDE,ZESTORETIC) 20-25 mg per tablet Take 1 tablet by mouth daily. 90 tablet 3   ??? MAGNESIUM ORAL Take by mouth daily. OTC     ??? naloxone (NARCAN) 4 mg nasal spray One spray in either nostril once for known/suspected opioid overdose. May repeat every 2-3 minutes in alternating nostril til EMS arrives (Patient not taking: Reported on 11/11/2021) 2 each 0   ??? ondansetron (ZOFRAN) 4 MG tablet Take 1 tablet (4 mg total) by mouth every six (6) hours as needed for nausea. 30 tablet 1   ??? potassium chloride (KLOR-CON) 10 MEQ CR tablet Take 2 tablets (20 mEq total) by mouth Two (2) times a day. 360 tablet 0   ??? pregabalin (LYRICA) 200 MG capsule Take 1 capsule (200 mg total) by mouth Two (2) times a day. 60 capsule 2   ??? prochlorperazine (COMPAZINE) 10 MG tablet Take 1 tablet (10 mg total) by mouth every six (6) hours as needed for nausea. 30 tablet 6   ??? senna (SENOKOT) 8.6 mg tablet Take 3 tablets by mouth in the morning. 30 tablet 2   ??? thiamine (VITAMIN B-1) 50 MG tablet Take 1 tablet (50 mg total) by mouth in the morning. 90 tablet 1   ??? traZODone (DESYREL) 50 MG tablet Take 1.5 tablets (75 mg total) by mouth nightly. 45 tablet 1   ??? tucatinib (TUKYSA) 150 mg tablet Take 1 tablet (150 mg total) by mouth two (2) times a day. 60 tablet 1   ??? tucatinib (TUKYSA) 50 mg tablet Take 2 tablets (100 mg total) by mouth two (2) times a day . 120 tablet 3   ??? venlafaxine (EFFEXOR-XR) 150 MG 24 hr capsule Take 2 capsules (300 mg total) by mouth daily. 60 capsule 2     No current facility-administered medications for this visit.       Allergies:   Allergies   Allergen Reactions   ??? Adhesive Rash   ??? Decadron [Dexamethasone] Anxiety     Mania as well   ??? Suboxone [Buprenorphine-Naloxone] Hallucinations   ??? Tetracycline      Other reaction(s): Other (See Comments)   ??? Oxycodone      Night terrors   ??? Skin Protectants, Misc. Rash   ??? Tegaderm Adhesive-No Drug-Allergy Check Rash       Family History:  Cancer-related family history includes Cancer in her father, maternal grandfather, maternal uncle, maternal uncle, and paternal aunt.  She indicated that the status of her mother is unknown. She indicated that the status of her father is unknown. She indicated that the status of her brother is unknown. She indicated that the status of her maternal grandmother is unknown. She indicated that the status of her maternal grandfather is unknown. She indicated that the status of her paternal grandmother is unknown. She indicated that the status of her paternal grandfather is unknown. She indicated that the status of her maternal aunt is unknown. She indicated that the status of her paternal uncle is unknown. She indicated that the  status of her neg hx is unknown. She indicated that the status of her other is unknown.           Lab Results   Component Value Date    CREATININE 0.62 10/19/2021     Lab Results   Component Value Date    ALKPHOS 212 (H) 10/19/2021    BILITOT 0.5 10/19/2021    BILIDIR 0.30 03/29/2021    PROT 6.9 10/19/2021    ALBUMIN 3.7 10/19/2021    ALT 21 10/19/2021    AST 39 (H) 10/19/2021                  Burtis Junes, FNP-BC, [facility]  Outpatient Oncology Palliative Care Service  [facility]  [address], [place], [place] [address]  [phone]     {    Coding tips - Do not edit this text, it will delete upon signing of note!    ?? Telephone visits [phone] for Physicians and APP??s and [phone] for Non- Physician Clinicians)- Only use minutes on the phone to determine level of service.    ?? Video visits [phone]) - Use both minutes on video and pre/post minutes to determine level of service.       :[id number]}    {    Coding tips - Do not edit this text, it will delete upon signing of note!    ?? Telephone visits [phone] for Physicians and APP??s and [phone] for Non- Physician Clinicians)- Only use minutes on the phone to determine level of service.    ?? Video visits [phone] - Use both minutes on video and pre/post minutes to determine level of service.       :[id number]}    {    Coding tips - Do not edit this text, it will delete upon signing of note!    ?? Telephone visits [phone] for Physicians and APP??s and [phone] for Non- Physician Clinicians)- Only use minutes on the phone to determine level of service.    ?? Video visits [phone]) - Use both minutes on video and pre/post minutes to determine level of service.       :[id number]}    The patient reports they are currently: at home. I spent 17 minutes on the real-time audio and video with the patient on the date of service. I spent an additional 10 minutes on pre- and post-visit activities on the date of service.     The patient was physically located in [place] or a state in which I am permitted to provide care. The patient and/or parent/guardian understood that s/he may incur co-pays and cost sharing, and agreed to the telemedicine visit. The visit was reasonable and appropriate under the circumstances given the patient's presentation at the time.    The patient and/or parent/guardian has been advised of the potential risks and limitations of this mode of treatment (including, but not limited to, the absence of in-person examination) and has agreed to be treated using telemedicine. The patient's/patient's family's questions regarding telemedicine have been answered.     If the visit was completed in an ambulatory setting, the patient and/or parent/guardian has also been advised to contact their provider???s office for worsening conditions, and seek emergency medical treatment and/or call 911 if the patient deems either necessary.

## 2021-12-23 DIAGNOSIS — C50811 Malignant neoplasm of overlapping sites of right female breast: Principal | ICD-10-CM

## 2021-12-23 DIAGNOSIS — C7802 Secondary malignant neoplasm of left lung: Principal | ICD-10-CM

## 2021-12-23 NOTE — Unmapped (Signed)
Pharmacist Medication Follow-Up  I called the patient to f/u on capecitabine and tucatinib.  I left a VM with my contact information.

## 2021-12-23 NOTE — Unmapped (Signed)
Not able to connect with [name] for today's video or phone visit. Will call tomorrow to check in. Text sent as well and awaiting Soren's reply.    Burtis Junes, FNP-BC, [facility]  Outpatient Oncology Palliative Care Service  [facility]  [address], [place], [place] [address]  [phone]

## 2021-12-23 NOTE — Unmapped (Signed)
BREAST MEDICAL ONCOLOGY  -----------------------------------------------------------------------------------------------------  Follow-up Outpatient Evaluation    PCP: [name], MD     Consulting Physicians: Surgical oncology: Tyson Alias M.D.     Reason for Visit: Here for management of breast cancer.  -----------------------------------------------------------------------------------------------------  I personally spent 40 minutes face-to-face and non-face-to-face in the care of this patient, which includes all pre, intra, and post visit time on the date of service.  -----------------------------------------------------------------------------------------------------  Assessment:  ANYJAH ROUNDTREE is a 65 y.o. 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 She was switch d/t abnormal LFT's (from ETOH/NASH cirrhosis) on TDM1 (not progression).     Ms. Ghosh presents today for pretreatment evaluation on Xeloda, tucatinib + Herceptin.  By my exam today she has no local control issues and has no symptoms or signs suggestive of progressive disease.  She is tolerating therapy well with generally expected and manageable toxicities.  Her labs today showed hypokalemia (K 2.9).  Repeat brain MRI 11/17/21 was stable. She get repeat CT C/A/P + NM BS on 01/20/22 and follow-up in the transition clinic with Dr. [name]. She will reach out in the interim if she has any questions or concerns.     1. Metastatic breast cancer, ER-, PR+, HER-2 positive to lung, br  A. Systemic therapy    First-line: THP. Taxol d/c for G3 neuropathy.  Pertuzumab d/c for G3 diarrhea.  Not progression.   Second line: Trastuzumab + AI -->Tamoxifen 2/2 arthralgias.   Third line: Rebiopsy ER -/PR 10%/HER-2 +ve. C1 TDM1 03/23/20 limited by neuropathy. 09/01/20: DR TDM1 to 2.4 mg/kg. Discontinue TDM1 d/t abnormal LFT's and not progression.   Fourth line: Herceptin + tucatinib + capecitabine (Xeloda). 05/20/21 DR capecitabine to 1000 mg BID, and tucatinib to 250 mg BID d/t diarrhea. D/t issues with adherence, her capecitabine dose was changed to 500 mg po BID on a continuous schedule on 07/06/21.  Subsequent lines:  Consider return to TDM1 or Enhertu on progression predicated on LFTs.    B.  Systemic imaging.   -- CT C/A/P 8/16/2: stable disease. Interval reduction in size of left lower lobe mass. NM BS 08/10/21: No evidence of osseous metastatic disease.    C.  Brain mets/neuroimaging   Surgery: 01/15/19: resection of the right frontal met. [+++] MBC.  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   07/14/21: MRI brain: SD.  11/17/21 MRI Brain: SD    D.  Molecular  Genomics:  STRATA: ERBB2, PIK3CA.  Not eligible for HARMONY.  Genetic: Referral submitted in 03/2020, patient was never reached.   Tumor Markers: stable    E. Radiation:   09/05/17: CK to 7 brain lesions. 09/13/18: CK (1#) right 9 mm lesion. 2/28 - 02/28/2019: CK 25 Gy in 5# to left frontal resection bed.     F. 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 Barbette Merino who thought not likely consequential but recommended CV cards eval.   > F/u with Dr. Harland German scheduled for 02/04/22  > Echo 12/30/20: EF 60-65%.     2.  Comorbidities/supportive care   > Dysphagia/GERD/hemoglobin drop: Continue Nexium/famotidine.  TTG positive.  Given stable Hgb will hold on endoscopy for now.  F/u with Dr. Randye Lobo.  Gluten-free diet. Improved.   > Depression/Anxiety: Venlafaxine 225 mg. C/w clonazepam. F/u psychiatry/[facility] (Dr. Vertell Limber).  Will get PAM  > Peripheral neuropathy: Venlafaxine 225 mg. C/w Lyrica dose 200 mg BID.  Paraneoplastic panels unremarkable. Dose reduced TDM1.  Did not tolerate methadone due to sedation/dizziness. Follows with palliative care. Can refer back to neurology to consider TENS versus scrambler therapy.   > Smoking cessation: Previously encouraged to stop smoking. 1 ppd currently. Previously prescribed Chantix.   > Insomnia: Continue trazodone 75mg .  No longer taking Remeron.   > Arthralgias: C-spine x-ray showed DJD.  Muscle relaxant by PCP. Referred to PT for back previously. Currently receiving neck PT.  If no improvement refer back to Dr. Glenice Laine. Consider PCI. Nondisplaced right hip fracture: Has morphine patches for mgmt.   > Cirrhosis. Abnormal LFTs. Follows with GI, Dr. Raford Pitcher. No more alcohol. Will recheck LFTs today.  >Scapulothoracic bursitis: Consider steroid injections. Referred to Dr. Glenice Laine.    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 Applying foot and heel lotion.   > Anoreixa/weight loss: will reach out pall care re: appetite stimulants (pt asks about marinol); also met with nutrition today  >Hypokalemia: 20 meq IV x 1 today.  Start 20 meq BID KCl at home.     3.  Follow-up   -- Continue Tucatinib + IV Trastuzumab+ Xeloda  -- Repeat CT CAP + NM BS prior to next visit  -- RTC 01/21/22  months for provider visit + labs   -----------------------------------------------------------------------------------------------------  Interval history:  Ms. Rockhold is a 65 y.o. F who presents today accompanied by her sister, Misty Stanley,, for follow up on Xeloda, Tucatinib, Herceptin.   -- Doing well overall  -- Remains compliant on Xeloda; +redness of palms/plantar surfaces with cracking at finger nails; unchanged  -- Diarrhea ongoing; tolerable with PRN lomotil  -- more anorexia, poor appetite  -- ongoing back pain  -- No new breast related complaints  -- Full 12 ROS reviewed and otherwise mild/none.    Social history update: Sister Lisa's granddaughter recently passed away and her grandson was diagnosed with brain cancer. Has a boyfriend, [name], She has a son, [name]. She has another sister, [name], who recently had a new great-grandchild.     Review of Systems: A complete twelve systems review was obtained and is positive per the HPI but otherwise negative or unchanged. See MIMS #1170 where available.    ONC HISTORY  Metastatic HR + HER-2 overexpressing breast cancer to lung and brain.  First-line and second line: Trastuzumab + AI -->Tamoxifen d/t arthralgias.  3rd line:  TDM1.  4th line: capecitabine (xeloda), tucatinib, herceptin    Consider [id number], Enhertu on progression.     Radiation: 09/05/17: CK to 7 brain lesions. 09/13/18: CK (1#) right 9 mm lesion. 2/28 - 02/28/2019: CK 25 Gy in 5# to left frontal resection bed.  Surgery: 01/15/19: resection of the right frontal met.  Genomic: STRATA: ERBB2, PIK3CA.  Not eligible for HARMONY.    2017 or earlier:  R breast wound and nipple inversion which patient describes herself as ignoring for some time    02/04/16:  Mammogram R breast showing 3.1 cm irregular spiculated dense mass in lateral Rt breast w nipple and skin retraction. 2 subcentimeter irregular masses in the RUOQ  (10', 11') (possible satellite lesions). Large Rt axillary lymph node 1.7. Lt breast clear. BIRAD 5.    02/08/16:  Core biopsy R axillary LN (breast bx deferred due to open wound) Gr 3, IDC with apocrine features. ER (-), PR (31-40), HER-2 (3+). Noted to have multiple skin lesions.    02/10/16:  Initial staging  with CT CAP: Large, necrotic right breast mass with wide open tract to the skin, consistent with known right breast malignancy.  Numerous enlarged right axillary, subpectoral, mediastinal, bilateral hilar lymph nodes and innumerable bilateral pulmonary nodules.  Bone scan negative for osseous mets.    --Started trastuzumab initially with THP.  Taxol d/c after three cycles for G3 neuropathy.  Pertuzumab d/c for G3 diarrhea.  Not discontinued due to progression.    04/28/16:  Switched to anastrozole/trastuzumab  --patient had trouble tolerating AIs, switched to exemestane    07/13/16: genomic testing with STRATA showing ERBB2 copy # alteration, PIK3CA p.[id number]    08/01/17: MRI showed multiple small brain metastases   --completed CK to 7 lesions on 09/05/17.   --CK to the right 9 mm lesion (1 fx on 09/13/18).    --continued on AI/trastuzumab    11/2018:  progression of her frontal lobe lesion.   --resection of the right frontal mass by Dr. Tresa Garter on 01/15/2019.  Pathology was consistent with metastatic breast cancer.  --2/28 - 02/28/2019: CK 25 Gy in 5# to left frontal resection bed     04/2019: switched to tamoxifen/trastuzmab due to intolerance of AIs (joint pain    09/2019:  Concern for possible slight extra-cranial progression in LLL lung mass, elected to continue on current tx    01/03/20:  Repeat CT with new lingular 1.9 cm nodular opacity; differential diagnosis includes new metastatic lesion and consolidative/infectious opacity. CT chest 1 month is recommended to help discriminate between these two etiologies.  Also slight increase R breast mass.    02/03/20:  Repeat CT again with lingular 1.9 cm nodule unchanged to slightly increased in size, left lower lobe 3.5 cm mass slightly increased in size. Discussed at [facility] due to concern that lingular mass might represent lung primary in current smoker. Recommended biopsy.      01/2020:  Changed to fulvestrant/trastuzumab due to progression    03/05/20:  FNA lung biopsy showing carcinoma c/w breast primary (GATA3+, TTF1-), ER neg/PR 10%/HER2 3+.  Switched to [employer]. C1D1 03/23/20.    03/29/21: Switched to capecitabine (xeloda), tucatinib, herceptin d/t abnormal LFT's.      Onc Family History:   - Father: prostate  - Mat grandfather: prostate  - Mat uncle: lung  - Pat aunt: breast   - Great niece: adrenal     Social History:   Social History     Social History Narrative    She lives in senior housing in [place] x2 months. he has 4 sisters, 2 nearby and involved in care. She has 1 son-[name], recently married 62 yo, who lives about an hour and a half away in [place]. He works full-time as a Sales executive. She is still a chronic smoker (1ppd). She is currently divorced.         Goes to [employer] in [place] and gets a lot of support. Her church friend [name] and her sisters goes with her to chemo appts.     Physical Examination:   Vital Signs: BP 120/70  - Pulse 70  - Temp 36.9 ??C (98.5 ??F) (Temporal)  - Resp 16  - Wt 52.3 kg (115 lb 3.2 oz)  - LMP  (LMP Unknown)  - SpO2 98%  - BMI 21.07 kg/m??  See flow sheet  General: Chronically ill appearing female in no acute distress.    HEENT: Well-healed (R) frontal scalp scar. EOMI. Sclerae anicteric.   Neck: Supple. No cervical or supraclavicular adenopathy.  Pulm: Lungs clear to auscultation bilat; breathing non-labored.   Cardiac: Regular rate and rhythm; no LE edema.    Abdominal: Non tender  Musculoskeletal: Tenderness to b/l lower scapula.    Breasts/chest: Deferred: (R) breast: Nipple autolyzed. Small ulcer laterally to redeveloping nipple closed. (L) breast exam: Port to the upper left chest.  Neurologic:  Alert and oriented. Grossly non-focal  Lymphatic: No cervical or supraclavicular adenopathy. No palpable right axillary adenopathy.  Skin: Erythema of palms/feet with cracking    DATA REVIEW:    Pertinent labs/imaging/pathology reviewed in detail.

## 2021-12-24 ENCOUNTER — Ambulatory Visit: Admit: 2021-12-24 | Discharge: 2021-12-24 | Payer: MEDICARE

## 2021-12-24 ENCOUNTER — Institutional Professional Consult (permissible substitution): Admit: 2021-12-24 | Discharge: 2021-12-24 | Payer: MEDICARE

## 2021-12-24 DIAGNOSIS — C7802 Secondary malignant neoplasm of left lung: Principal | ICD-10-CM

## 2021-12-24 DIAGNOSIS — E876 Hypokalemia: Principal | ICD-10-CM

## 2021-12-24 DIAGNOSIS — R110 Nausea: Principal | ICD-10-CM

## 2021-12-24 DIAGNOSIS — K219 Gastro-esophageal reflux disease without esophagitis: Principal | ICD-10-CM

## 2021-12-24 DIAGNOSIS — C50811 Malignant neoplasm of overlapping sites of right female breast: Principal | ICD-10-CM

## 2021-12-24 DIAGNOSIS — F32A Depression, unspecified depression type: Principal | ICD-10-CM

## 2021-12-24 DIAGNOSIS — C50919 Malignant neoplasm of unspecified site of unspecified female breast: Principal | ICD-10-CM

## 2021-12-24 DIAGNOSIS — K900 Celiac disease: Principal | ICD-10-CM

## 2021-12-24 LAB — COMPREHENSIVE METABOLIC PANEL
ALBUMIN: 4 g/dL (ref 3.4–5.0)
ALKALINE PHOSPHATASE: 186 U/L — ABNORMAL HIGH (ref 46–116)
ALT (SGPT): 10 U/L (ref 10–49)
ANION GAP: 4 mmol/L — ABNORMAL LOW (ref 5–14)
AST (SGOT): 33 U/L (ref <=34)
BILIRUBIN TOTAL: 0.8 mg/dL (ref 0.3–1.2)
BLOOD UREA NITROGEN: 8 mg/dL — ABNORMAL LOW (ref 9–23)
BUN / CREAT RATIO: 11
CALCIUM: 9.3 mg/dL (ref 8.7–10.4)
CHLORIDE: 108 mmol/L — ABNORMAL HIGH (ref 98–107)
CO2: 28.3 mmol/L (ref 20.0–31.0)
CREATININE: 0.72 mg/dL
EGFR CKD-EPI (2021) FEMALE: >90 mL/min/{1.73_m2} (ref >=60)
GLUCOSE RANDOM: 101 mg/dL (ref 70–179)
POTASSIUM: 2.9 mmol/L — ABNORMAL LOW (ref 3.4–4.8)
PROTEIN TOTAL: 7.2 g/dL (ref 5.7–8.2)
SODIUM: 140 mmol/L (ref 135–145)

## 2021-12-24 LAB — CBC W/ AUTO DIFF
BASOPHILS ABSOLUTE COUNT: 0 10*9/L (ref 0.0–0.1)
BASOPHILS RELATIVE PERCENT: 0.7 %
EOSINOPHILS ABSOLUTE COUNT: 0 10*9/L (ref 0.0–0.5)
EOSINOPHILS RELATIVE PERCENT: 0.3 %
HEMATOCRIT: 41.8 % (ref 34.0–44.0)
HEMOGLOBIN: 14.3 g/dL (ref 11.3–14.9)
LYMPHOCYTES ABSOLUTE COUNT: 1.8 10*9/L (ref 1.1–3.6)
LYMPHOCYTES RELATIVE PERCENT: 26 %
MEAN CORPUSCULAR HEMOGLOBIN CONC: 34.3 g/dL (ref 32.0–36.0)
MEAN CORPUSCULAR HEMOGLOBIN: 39 pg — ABNORMAL HIGH (ref 25.9–32.4)
MEAN CORPUSCULAR VOLUME: 113.8 fL — ABNORMAL HIGH (ref 77.6–95.7)
MEAN PLATELET VOLUME: 7.8 fL (ref 6.8–10.7)
MONOCYTES ABSOLUTE COUNT: 0.6 10*9/L (ref 0.3–0.8)
MONOCYTES RELATIVE PERCENT: 8.6 %
NEUTROPHILS ABSOLUTE COUNT: 4.4 10*9/L (ref 1.8–7.8)
NEUTROPHILS RELATIVE PERCENT: 64.4 %
NUCLEATED RED BLOOD CELLS: 0 /100{WBCs} (ref <=4)
PLATELET COUNT: 129 10*9/L — ABNORMAL LOW (ref 150–450)
RED BLOOD CELL COUNT: 3.67 10*12/L — ABNORMAL LOW (ref 3.95–5.13)
RED CELL DISTRIBUTION WIDTH: 15.6 % — ABNORMAL HIGH (ref 12.2–15.2)
WBC ADJUSTED: 6.8 10*9/L (ref 3.6–11.2)

## 2021-12-24 LAB — CANCER ANTIGEN 27.29: CA 27-29: 63.7 U/mL — ABNORMAL HIGH (ref <=38.6)

## 2021-12-24 MED ORDER — ESOMEPRAZOLE MAGNESIUM 40 MG CAPSULE,DELAYED RELEASE
ORAL_CAPSULE | Freq: Every day | ORAL | 3 refills | 90.00000 days | Status: CP
Start: 2021-12-24 — End: ?

## 2021-12-24 MED ORDER — POTASSIUM CHLORIDE 20 MEQ ORAL PACKET
PACK | Freq: Two times a day (BID) | ORAL | 0 refills | 30 days | Status: CP
Start: 2021-12-24 — End: ?

## 2021-12-24 MED ORDER — VENLAFAXINE ER 150 MG CAPSULE,EXTENDED RELEASE 24 HR
ORAL_CAPSULE | Freq: Every day | ORAL | 5 refills | 30 days | Status: CP
Start: 2021-12-24 — End: ?

## 2021-12-24 MED ADMIN — sodium chloride (NS) 0.9 % infusion: 100 mL/h | INTRAVENOUS | @ 17:00:00 | Stop: 2021-12-24

## 2021-12-24 MED ADMIN — sodium chloride (NS) 0.9 % flush 10 mL: 10 mL | INTRAVENOUS | @ 19:00:00 | Stop: 2021-12-24

## 2021-12-24 MED ADMIN — heparin, porcine (PF) 100 unit/mL injection 500 Units: 500 [IU] | INTRAVENOUS | @ 15:00:00 | Stop: 2021-12-24

## 2021-12-24 MED ADMIN — potassium chloride 20 mEq in 100 mL IVPB Premix: 20 meq | INTRAVENOUS | @ 17:00:00 | Stop: 2021-12-24

## 2021-12-24 MED ADMIN — trastuzumab (HERCEPTIN) 340 mg in sodium chloride (NS) 0.9 % 250 mL IVPB: 6 mg/kg | INTRAVENOUS | @ 18:00:00 | Stop: 2021-12-24

## 2021-12-24 MED ADMIN — heparin, porcine (PF) 100 unit/mL injection 500 Units: 500 [IU] | INTRAVENOUS | @ 19:00:00 | Stop: 2021-12-24

## 2021-12-24 NOTE — Unmapped (Signed)
[facility]   Medical Nutrition Therapy Consultation       Visit Type:    Return Assessment    Referral Reason:   Re-consult due to weight loss and poor intake    Brooke [name] is a 65 y.o. female seen for medical nutrition therapy for weight loss and decreased intake in the setting of metastatic breast cancer to the lung and brain. Current therapy consists of Xeloda, tucatinib and herceptin. She was seen during infusion with her sister present at time of visit. Eating ~ 1 meal per day. Her medication list, allergies, notes from last several encounters and lab results were reviewed.     Past Medical History:   Diagnosis Date   ??? Abnormal ECG unsure   ??? Alcoholism (CMS-HCC)    ??? Allergic rhinitis    ??? Anemia    ??? Anxiety     insomnia   ??? Arthritis not sure   ??? Brain concussion    ??? Breast cancer (CMS-HCC)     chemo for now with mets   ??? COPD (chronic obstructive pulmonary disease) (CMS-HCC)    ??? Depression    ??? Dysphagia    ??? Fatty liver 07/08/2021   ??? Genitourinary disease    ??? GERD (gastroesophageal reflux disease)    ??? Headache    ??? HTN (hypertension)    ??? Hyperlipidemia    ??? Insomnia    ??? 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 Stroke (CMS-HCC) Nov. 2020    Dr. Theodoro Kalata   ??? Tobacco dependence        Anthropometrics   Height:  157.5 cm  Current Weight:   52.5 (kg) 12/24/21  BMI: 21.17 kg/m2   Ideal Body Weight:  50 (kg)   Percent Ideal Body Weight: 105%    Weight history:  Wt Readings from Last 6 Encounters:   12/24/21 52.5 kg (115 lb 11.9 oz)   12/24/21 52.3 kg (115 lb 3.2 oz)   11/26/21 55.3 kg (121 lb 14.6 oz)   11/11/21 54 kg (119 lb)   11/05/21 55.3 kg (122 lb)   10/19/21 54.7 kg (120 lb 9.6 oz)       Nutrition Risk Screening:   Food Insecurity: Not on file       ASPEN/AND Malnutrition Screening:   Pt does not meet ASPEN/AND criteria for malnutrition as noted below-unable to fully assess  Malnutrition Designation:   -- Weight loss: 7.5 % weight loss x 4.5 months (Not >7.5% in < 3 months)  -- Energy Intake: intake <75% of needs for > 1 month  -- Muscle Mass: unable to fully assess  -- Fat Mass: unable to fully assess  -- Fluid Accumulation: did not assess  -- Functional Assessment: did not assess    Biochemical Data, Medical Tests and Procedures:  All pertinent labs and imaging reviewed by Genevie Ann at 12:15 PM 12/28/2021.    Receiving potassium supplementation during infusion today  Medications and Vitamin/Mineral Supplementation:   All nutritionally pertinent medications reviewed on 12/24/2021.   She is not taking nutrition supplements. She denied taking supplements for the past several months but has several at home.    Current Outpatient Medications   Medication Sig Dispense Refill   ??? albuterol HFA 90 mcg/actuation inhaler Inhale 2 puffs every six (6) hours as needed for wheezing. (Patient not taking: Reported on 12/24/2021)     ??? aluminum-magnesium hydroxide-simethicone 200-200-20 mg/5 mL Susp 80  mL, diphenhydrAMINE 12.5 mg/5 mL Liqd 200 mg, nystatin 100,000 unit/mL Susp 8,000,000 Units, distilled water Liqd 80 mL, lidocaine 2% viscous 2 % Soln 80 mL Take 5 mL by mouth every six (6) hours as needed. Swish, gargle, spit or swallow if throat issues (Patient not taking: Reported on 12/24/2021) 80 mL 2   ??? capecitabine (XELODA) 500 MG tablet Take 1 tablet (500 mg total) by mouth two (2) times a day, continuously. 60 tablet 5   ??? clobetasoL (TEMOVATE) 0.05 % ointment Apply twice a day to rash on palm until smooth/clear. 30 g 1   ??? clonazePAM (KLONOPIN) 0.5 MG tablet Take 0.5 mg daily as needed for anxiety.  Do not exceed 0.5 mg/day. 30 tablet 0   ??? diclofenac (VOLTAREN) 50 MG EC tablet Take 1 tablet (50 mg total) by mouth two (2) times a day as needed. With food (Patient not taking: Reported on 12/24/2021) 60 tablet 1   ??? diclofenac sodium (VOLTAREN) 1 % gel Apply 2 g topically four (4) times a day as needed for arthritis or pain. 150 g 2   ??? diphenoxylate-atropine (LOMOTIL) 2.5-0.025 mg per tablet Take 1 tablet by mouth 4 (four) times a day as needed for diarrhea. 30 tablet 1   ??? dorzolamide (TRUSOPT) 2 % ophthalmic solution Administer 1 drop into the left eye Three (3) times a day. 10 mL 12   ??? esomeprazole (NEXIUM) 40 MG capsule Take 1 capsule (40 mg total) by mouth in the morning. 90 capsule 3   ??? FARXIGA 5 mg Tab tablet Take 5 mg by mouth daily.     ??? HYDROcodone-acetaminophen (NORCO 10-325) 10-325 mg per tablet Take 1 tablet by mouth every twelve (12) hours as needed for pain. 60 tablet 0   ??? lidocaine (LIDODERM) 5 % patch Place 1 patch on the skin daily. Apply to affected area for 12 hours only each day (then remove patch) 30 patch 3   ??? lisinopriL-hydrochlorothiazide (PRINZIDE,ZESTORETIC) 20-25 mg per tablet Take 1 tablet by mouth daily. 90 tablet 3   ??? MAGNESIUM ORAL Take by mouth daily. OTC (Patient not taking: Reported on 12/24/2021)     ??? naloxone (NARCAN) 4 mg nasal spray One spray in either nostril once for known/suspected opioid overdose. May repeat every 2-3 minutes in alternating nostril til EMS arrives (Patient not taking: Reported on 11/11/2021) 2 each 0   ??? ondansetron (ZOFRAN) 4 MG tablet Take 1 tablet (4 mg total) by mouth every six (6) hours as needed for nausea. 30 tablet 1   ??? potassium chloride (KLOR-CON) 20 mEq packet Take 1 packet (20 mEq total) by mouth Two (2) times a day. 60 packet 0   ??? pregabalin (LYRICA) 200 MG capsule Take 1 capsule (200 mg total) by mouth Two (2) times a day. 60 capsule 2   ??? prochlorperazine (COMPAZINE) 10 MG tablet Take 1 tablet (10 mg total) by mouth every six (6) hours as needed for nausea. 30 tablet 6   ??? senna (SENOKOT) 8.6 mg tablet Take 3 tablets by mouth in the morning. 30 tablet 2   ??? thiamine (VITAMIN B-1) 50 MG tablet Take 1 tablet (50 mg total) by mouth in the morning. 90 tablet 1   ??? traZODone (DESYREL) 50 MG tablet Take 1.5 tablets (75 mg total) by mouth nightly. 45 tablet 1   ??? tucatinib (TUKYSA) 150 mg tablet Take 1 tablet (150 mg total) by mouth two (2) times a day. 60 tablet 1   ???  tucatinib (TUKYSA) 50 mg tablet Take 2 tablets (100 mg total) by mouth two (2) times a day . 120 tablet 3   ??? venlafaxine (EFFEXOR-XR) 150 MG 24 hr capsule Take 2 capsules (300 mg total) by mouth daily. 60 capsule 5     No current facility-administered medications for this visit.     Facility-Administered Medications Ordered in Other Visits   Medication Dose Route Frequency Provider Last Rate Last Admin   ??? heparin, porcine (PF) 100 unit/mL injection 500 Units  500 Units Intravenous Q30 Min PRN Olivia Mackie, MD       ??? OKAY TO SEND MEDICATION/CHEMOTHERAPY TO OUTPATIENT UNIT   Other Once Clarissa Trixie Dredge, PA       ??? sodium chloride (NS) 0.9 % flush 10 mL  10 mL Intravenous Q30 Min PRN Olivia Mackie, MD       ??? sodium chloride (NS) 0.9 % infusion  100 mL/hr Intravenous Continuous Clarissa Trixie Dredge, PA 100 mL/hr at 12/24/21 1210 100 mL/hr at 12/24/21 1210       Nutrition History:     Dietary Restrictions: She reports being gluten free and suffering from Celiac disease    Gastrointestinal Issues: Swallowing difficulty- reports sensation of food getting stuck in the back of her throat when she eats certain foods such as bread, chips, corn bread, lettuce. She avoids most of these foods at this time.    Oral Issues: previously using type of mouth rinse/wash for dry mouth but cannot currently afford    Hunger and Satiety: Poor appetite and limited intake.     Food Safety and Access: She expressed limited access to food.  She expressed limited access to nutrition-related supplies. Voiced financial concerns with getting groceries and gluten free foods at this time.    Diet Recall:   Breakfast: Tomasa Blase and eggs   Lunch: nothing  Dinner:nothing  Beverages : tea, water, juice, milk  Alcohol: hx alcoholism noted with current occasional intake reported per patient. She reported consuming 2-3 glasses of wine on Thanksgiving Day and ~2 glasses of wine on Christmas day  Supplements:Previously tried Ensure type shakes and did not like them    Food-Related History:  Usual Food Choices: She usually has 1 meal per day which is breakfast. She does cook this meal but typically does not cook the rest of the day due to her back hurting her and due to difficulty standing and washing dishes afterwards. She does not like to use the microwave.     Nutrition Assessment       Inadequate oral intake related to metastatic breast cancer and social, enviromental and financial factors as evidenced by eating ~ 1 meal daily, unable to cook often, food insecurity reported and 7.5% weight loss noted x 4.5 months.    Daily Estimated Nutritional Needs:  Energy:  1600-1850 kcals [  using  30-35kcal/kg  ]  Protein:   63-73 gm [  using  1.2-1.4g/kg  ]  Fluid:   1600-189ml [ 71ml/kcal]    Nutrition Goals & Evaluation      Intake will aim to meet >75% of estimated nutrient needs  (New and Not Met)  Aim for weight maintenance and/or gian  (Not Met), revised    Nutrition goals reviewed, and relevant barriers identified and addressed: acuity of illness and financial concerns. She is evaluated to have fair willingness and ability to achieve nutrition goals.     Nutrition Intervention      - Nutrition Education: soft  diet diet/eating style. Education resources provided include: handouts promoting nutrition intervention   - Nutrition Counseling: Problem solving  - Oral Nutrition Supplementation: Ensure Clear   - Vitamin/Mineral Supplementation: general multivitamin   - food well    Nutrition Plan:   ??? Suggested trying softer foods/foods with sauce, gravy, added moisture due to sensation of certain foods getting stuck in back of her throat. Handouts provided on soft food examples. Handout also provided with baking soda and salt rinse recipe for oral mouth care.   ??? Discussed examples of soft easy to prepare foods to help increase PO intake but keep time spent standing to prepare them low. Due to food insecurity voiced by patient, she was enrolled in the FoodWell program. Unable to provide bags of groceries to patient today due to time. Will provide at next visit as able.   ??? Discucssed other nutritional supplements such as [employer] that could be added to her milk. Also provided samples of Ensure Clear along with coupons. Suggested making jello with the Ensure Clear if desired. Encouraged continuing with full fat milk to help provide increased calories.   ??? Due to decreased PO intake, she may benefit from a general multivitamin. She thought she had some at home currently. Discussed with PA via Epic.    Follow up will occur in 2-3 weeks as able, as desired by patient .     Food/Nutrition-related history, Anthropometric measurements, Biochemical data, medical tests, procedures, Nutrition-focused physical findings, Patient understanding or compliance with intervention and recommendations  and Effectiveness of nutrition interventions will be assessed at time of follow-up.     Recommendations for Care Team :  Encourage soft foods and foods with moisture.   Recommend Ensure Clear or other nutritional drink for added nutrition as desired, as able.   She may benefit from general multivitamin due to current decreased intake.       Time spent 15 minutes.

## 2021-12-24 NOTE — Unmapped (Signed)
Clinical Support on 12/24/2021   Component Date Value Ref Range Status    Sodium 12/24/2021 140  135 - 145 mmol/L Final    Potassium 12/24/2021 2.9 (L)  3.4 - 4.8 mmol/L Final    Chloride 12/24/2021 108 (H)  98 - 107 mmol/L Final    CO2 12/24/2021 28.3  20.0 - 31.0 mmol/L Final    Anion Gap 12/24/2021 4 (L)  5 - 14 mmol/L Final    BUN 12/24/2021 8 (L)  9 - 23 mg/dL Final    Creatinine 09/81/1914 0.72  0.60 - 0.80 mg/dL Final    BUN/Creatinine Ratio 12/24/2021 11   Final    eGFR CKD-EPI (2021) Female 12/24/2021 >90  >=60 mL/min/1.40m2 Final    eGFR calculated with CKD-EPI 2021 equation in accordance with [employer] and [employer] of Nephrology Task Force recommendations.    Glucose 12/24/2021 101  70 - 179 mg/dL Final    Calcium 78/29/5621 9.3  8.7 - 10.4 mg/dL Final    Albumin 30/86/5784 4.0  3.4 - 5.0 g/dL Final    Total Protein 12/24/2021 7.2  5.7 - 8.2 g/dL Final    Total Bilirubin 12/24/2021 0.8  0.3 - 1.2 mg/dL Final    AST 69/62/9528 33  <=34 U/L Final    ALT 12/24/2021 10  10 - 49 U/L Final    Alkaline Phosphatase 12/24/2021 186 (H)  46 - 116 U/L Final    WBC 12/24/2021 6.8  3.6 - 11.2 10*9/L Final    RBC 12/24/2021 3.67 (L)  3.95 - 5.13 10*12/L Final    HGB 12/24/2021 14.3  11.3 - 14.9 g/dL Final    HCT 41/32/4401 41.8  34.0 - 44.0 % Final    MCV 12/24/2021 113.8 (H)  77.6 - 95.7 fL Final    MCH 12/24/2021 39.0 (H)  25.9 - 32.4 pg Final    MCHC 12/24/2021 34.3  32.0 - 36.0 g/dL Final    RDW 02/72/5366 15.6 (H)  12.2 - 15.2 % Final    MPV 12/24/2021 7.8  6.8 - 10.7 fL Final    Platelet 12/24/2021 129 (L)  150 - 450 10*9/L Final    nRBC 12/24/2021 0  <=4 /100 WBCs Final    Neutrophils % 12/24/2021 64.4  % Final    Lymphocytes % 12/24/2021 26.0  % Final    Monocytes % 12/24/2021 8.6  % Final    Eosinophils % 12/24/2021 0.3  % Final    Basophils % 12/24/2021 0.7  % Final    Absolute Neutrophils 12/24/2021 4.4  1.8 - 7.8 10*9/L Final    Absolute Lymphocytes 12/24/2021 1.8  1.1 - 3.6 10*9/L Final    Absolute Monocytes 12/24/2021 0.6  0.3 - 0.8 10*9/L Final    Absolute Eosinophils 12/24/2021 0.0  0.0 - 0.5 10*9/L Final    Absolute Basophils 12/24/2021 0.0  0.0 - 0.1 10*9/L Final    Macrocytosis 12/24/2021 Moderate (A)  Not Present Final

## 2021-12-24 NOTE — Unmapped (Addendum)
--Start potassium 20 meq twice a day packets: Dissolve one packet in at least 120 mL of cold water or other beverage prior to administration.   --will reach out to Burtis Junes re: appetite stimulant  --scans before visit with Dr. [name]  --IV Potassium in infusion    [name]  Physician Assistant  [facility]  +++++++++++++++++++++++++++++++++++++++++++++++++++++++++++++++++++++++++++++++++++++++    Notice: Starting April 06, 2020, many test results will be automatically released into MyChart. This may happen before your provider has a chance to review them. Your results will either be reviewed with you at your scheduled appointment or your provider or someone from their office will reach out to you to discuss the results. Thank you for your understanding during this transition.      If you have been prescribed a medication today, always read the package insert that comes with the medication.     In the event of an emergency, always call 911    MyChart messages are only checked during business hours and should not be used for acute concerns.     [facility]:  Phone: [phone] (nurse triage #)     After hours/nights/weekends:  Call [phone]     Reasons to call emergency line may include:  Fever of 100.5 or greater  Nausea and/or vomiting not relieved with nausea medicine  Diarrhea or constipation not relieved with bowel regimen  Severe pain not relieved with usual pain regimen    Labs from today:  Clinical Support on 12/24/2021   Component Date Value Ref Range Status    Sodium 12/24/2021 140  135 - 145 mmol/L Final    Potassium 12/24/2021 2.9 (L)  3.4 - 4.8 mmol/L Final    Chloride 12/24/2021 108 (H)  98 - 107 mmol/L Final    CO2 12/24/2021 28.3  20.0 - 31.0 mmol/L Final    Anion Gap 12/24/2021 4 (L)  5 - 14 mmol/L Final    BUN 12/24/2021 8 (L)  9 - 23 mg/dL Final    Creatinine 62/95/2841 0.72  0.60 - 0.80 mg/dL Final    BUN/Creatinine Ratio 12/24/2021 11   Final    eGFR CKD-EPI (2021) Female 12/24/2021 >90  >=60 mL/min/1.18m2 Final    Glucose 12/24/2021 101  70 - 179 mg/dL Final    Calcium 32/44/0102 9.3  8.7 - 10.4 mg/dL Final    Albumin 72/53/6644 4.0  3.4 - 5.0 g/dL Final    Total Protein 12/24/2021 7.2  5.7 - 8.2 g/dL Final    Total Bilirubin 12/24/2021 0.8  0.3 - 1.2 mg/dL Final    AST 03/47/4259 33  <=34 U/L Final    ALT 12/24/2021 10  10 - 49 U/L Final    Alkaline Phosphatase 12/24/2021 186 (H)  46 - 116 U/L Final    WBC 12/24/2021 6.8  3.6 - 11.2 10*9/L Final    RBC 12/24/2021 3.67 (L)  3.95 - 5.13 10*12/L Final    HGB 12/24/2021 14.3  11.3 - 14.9 g/dL Final    HCT 56/38/7564 41.8  34.0 - 44.0 % Final    MCV 12/24/2021 113.8 (H)  77.6 - 95.7 fL Final    MCH 12/24/2021 39.0 (H)  25.9 - 32.4 pg Final    MCHC 12/24/2021 34.3  32.0 - 36.0 g/dL Final    RDW 33/29/5188 15.6 (H)  12.2 - 15.2 % Final    MPV 12/24/2021 7.8  6.8 - 10.7 fL Final    Platelet 12/24/2021 129 (L)  150 -  450 10*9/L Final    nRBC 12/24/2021 0  <=4 /100 WBCs Final    Neutrophils % 12/24/2021 64.4  % Final    Lymphocytes % 12/24/2021 26.0  % Final    Monocytes % 12/24/2021 8.6  % Final    Eosinophils % 12/24/2021 0.3  % Final    Basophils % 12/24/2021 0.7  % Final    Absolute Neutrophils 12/24/2021 4.4  1.8 - 7.8 10*9/L Final    Absolute Lymphocytes 12/24/2021 1.8  1.1 - 3.6 10*9/L Final    Absolute Monocytes 12/24/2021 0.6  0.3 - 0.8 10*9/L Final    Absolute Eosinophils 12/24/2021 0.0  0.0 - 0.5 10*9/L Final    Absolute Basophils 12/24/2021 0.0  0.0 - 0.1 10*9/L Final    Macrocytosis 12/24/2021 Moderate (A)  Not Present Final

## 2021-12-24 NOTE — Unmapped (Signed)
Pt arrived in clinic at 12:00. Port a cath  Was already accessed and has + blood return, then flushed with 10ml NS. There are no treatment holds or parameters so drug order was released to pharmacy. K+ 2.9, so potassium therapy plan initiated. IV potassium infused over 1 hour . Chemotherapy started at 13:02. Pt tolerated chemo well. At the end of therapy port flushed with 10ml of NS, followed by 5ml of Heparin (100units/ml) flush. Needle de accessed and bandaid applied. Pt given after care paperwork. Pt ambulates self out of clinic with steady gait.

## 2021-12-29 ENCOUNTER — Telehealth: Admit: 2021-12-29 | Discharge: 2021-12-30 | Payer: MEDICARE

## 2021-12-30 NOTE — Unmapped (Signed)
OUTPATIENT ONCOLOGY PALLIATIVE CARE    Principal Diagnosis: Ms. Culton is a 66 y.o. female with metastatic breast cancer,  diagnosed in 2017.  Disease sites include lung and brain.     Assessment/Plan:   1.Neuropathic pain in hands and feet-stable and continued mid upper back pain radiating to lower back appears to be arthritic-controlled with current regimen.  Hx of cervical thoracic paraspinal trigger point bilateral this past July 2022 with not as much relief as she has had in the past.     -Continue lyrica 200 mg bid dosing-relief with this.  -Continue hydrocodone 10/325 mg 1 tab 3 times a day as needed for pain. Rare use recently  -Continue Effexor 225 mg every day  -Continue diclofenac gel as needed    Opioid use in past:  -Had difficulty tolerating the methadone 2.5 mg dosing due to sedation.  Did not try the 1 mg methadone, so could consider this in the future if needed for the neuropathic discomfort.  Some reluctance in re-starting methadone.  -No relief with tramadol  -oxycodone-night terrors.  -Suboxone-hallucinations    2. Wgt loss/anorexia  -Active with dietitian  -Start Remeron 7.5 mg at at bedtime-checked with Dr. [name] and ok with this addition.     3. Fall 12/25/21-May be related to not eating the day before.  -Clarified medications.  Only taking the potassium 10 mill equivalent tablet once per day.  Prescription shares that should be 2 tablets twice a day and reviewed that plan is to take it as prescribed.    4.  Goals of care-not addressed today.     At prior visits: goals which are to decrease pain and to maintain her independence. Lives alone and [name] lives close by.     Wants to ensure that her quality of life remains high and the wish to keep on going.  Shared concern that she does not want to be in pain.  NP provided reassurance that our team can offer her different treatment strategies to help her with her goal.     Advance care planning-see advance care planning note dated 01/31/2020.  -at prior visits: Patient shared that she has been dreaming about death. She does have her funeral arrangements completed. Shared that she has her wishes written down and her Sister [name] knows where they are regarding her funeral. She still contemplating whether to be cremated or buried and she is leaning toward cremation. patient currently focused on cancer directed therapy.  Prefers to stay in the moment and not discussed things.  We will continue to support and address if she has a change in clinical condition.        -Advance directives scanned in on 11/02/19        HCDM [facility] [place] - Sister - [phone]    HCDM, [facility] - Sister - [phone]    At prior visits,   # Controlled substances risk management.  We are not currently prescribing controlled medications for her.   - Patient has a signed pain medication agreement with Outpt Palliative care, completed on 11/01/18, as per standard care. This was signed again on 01/28/19   - [place] database was reviewed today and it was appropriate.   - Urine drug screen was not performed at this visit. Findings: not applicable.   - Patient has received information about safe storage and administration of medications.   - Patient has received a prescription for narcan and shared with [name] and pt.  F/u: one month video    ----------------------------------------  Referring Provider: From inpatient oncology team  Oncology Team: Breast team-Dr. [name]  PCP: [name], MD      HPI: 66 year old woman with her to overexpressing metastatic breast cancer to her lung and brain.  Was found to have multiple small brain metastases and completed CyberKnife therapy on September 11. Describes ongoing pain in her pelvis, rates it as a 3 out of 10 today.  Has been improving over the last week or 2.  Feels like gabapentin has been helpful, and is also responded well to Tylenol and ibuprofen in the past.  She is taken oxycodone and it gave her night terrors does not want to take it again.  Has taken Dilaudid previously and did not have side effects from this.    Current cancer-directed therapy: capecitabine (Xeloda), tucatinib, and trastuzumab (Herceptin).     Diagnosis of COVID (07/18/21)      Interval hx 12/29/21 [facility] and [name]    -Fall on 12/31.  Felt wobbly and hit the brick fireplace that she was standing close to. ??Denies any alcohol use.  He feels that it is because she did not eat any food the day before. ??Does have some contusions-right eye and right hip. ??Does not think that she lost consciousness and was with somebody when it happened  -Doing okay.  -Really does not have an appetite.  When her boyfriend Orvilla Fus cooks she does eat better and recently has been eating better.  -She is no longer doing any cooking or the dishes.  When [name] does not cook she has frozen dinners.  -Her diarrhea has overall improved.  Usually only happening twice a day and some of the stools are more solid.  -Overall pain has not been a problem and really not using the Norco.  Nerve pain is okay with the Lyrica.  -Mood is so-so.  Still shares that she does get mad but not as bad as she has in the past.  But also some days of feeling depressed why me.  Shared she is grateful for her church community that is been very helpful.  -Taking the clonazepam late in the day is helpful.  Not using the trazodone.  Continues the Effexor.    Palliative Performance Scale: 70% - Ambulation: Reduced / unable to do normal work, some evidence of disease / Self-Care: Full / Intake: Normal or reduced / Level of Conscious: Full         Coping/Support Issues: Patient reports she is been able to attend church functions and is finding this very helpful. Has boyfriend [name] for the last 15 years.     At prior visits, patient did share that she continues to receive much help from her church friend, [name] and her husband.  They prayed together and she is found this supportive.  She states that her 2 sisters have been supportive, but she feels that they are becoming more less patient with her due to patient's irritability.     Overall coping well, no specific issues identified.  Finding support with her church community, her sister, [name], and prayer.         Social History: Pt lives in senior housing in [place], says it's ok and quiet but she is from [place] and liked it there more. She has 4 sisters, 2 nearby and involved in care. She has 1 son-[name], 35yo, who lives about an hour and a half away in [place]. She is divorced.  ??  Goes to [employer] in [place] and gets a lot of support. Her church friend [name] goes with her to chemo appts. Her sisters will go too.    Advance Care Planning: Advance directives scanned into system  HCPOA: See ACP note  Living Will: See ACP note  ACP note: Yes, advance directive scanned in on November 6    Objective     Opioid Risk Tool:      Opioid Risk Tool:   Female  Female    Family history of substance abuse      Alcohol   1  3    Illegal drugs  2  3    Rx drugs  4  4    Personal history of substance abuse      Alcohol  3  3    Illegal drugs  4  4    Rx drugs  5  5    Age between 16--45 years  1  1    History of preadolescent sexual abuse  3 0    Psychological disease      ADD, OCD, bipolar, schizophrenia  2  2    Depression  1  1    Total: 7  (<3 low risk, 4-7 moderate risk, >8 high risk)      Oncology History Overview Note   Identifying Statement:  Kimyetta Flott is a 66 y.o. female diagnosed with a right posterior frontal lobe metastasis (breast primary) status post resection 01/15/2019 and 5/5 fractions of SBRT (2500 Gy via CyberKnife) to the resection cavity.    Treatment History:  09/05/17: S/p SRS to 7 lesions  01/15/19: S/p resection, followed by SRS 2500 cGy   03/04/19: KPS 80; MRI with postsurgical changes versus residual disease; RTC in 2 weeks w/ MRI   04/15/19: KPS NA; MRI w/ SD; no study; RTC 6 weeks w/ MRI   06/10/19: KPS 80; MRI w/ SD; RTC 4 weeks  07/15/19: KPS 80; MRI w/ SD; RTC 8 weeks  08/19/19: KPS 80; start nortriptyline for HAs; RTC 4 weeks w/ MRI  09/16/19: KPS 80; con't nortriptyline for HAs; RTC 2 mos w/ MRI  11/11/19: KPS 80; MRI w/ SD though w/ small infarct; proceed w/ CVA workup; start 81mg  ASA; encouraged smoking cessation; con't nortriptyline; RTC to discuss results  11/25/19: KPS 80; discussed CVA workup results; MRA unremarkable; con't smoking cessation efforts; con't nortriptyline for HAs; con't 81mg  ASA, start Lipitor; f/u with PCP for COPD eval; RTC 2 mos w/ MRI     Malignant neoplasm of overlapping sites of right female breast (CMS-HCC)   2017 -  Presenting Symptoms    Large open wound RT breast which began as nipple inversion. Patient states that she ignored for a long time.  Physical exam of the area of concern in the RTbreast demonstrates a large open wound in the lateral right breast. Saw PCP 01/27/16 Rx Keflex.     02/04/2016 Interval Scan(s)    MMG/US: 3.1 (3.0) cm irregular spiculated dense mass in lateral Rt breast w nipple and skin retraction. 2 subcentimeter irregular masses in the RUOQ  (10', 11') (possible satellite lesions). Large Rt axillary lymph node 1.7. Lt breast clear. BIRAD 5.     02/08/2016 Biopsy    US guided Rt. Axillary LN biopsy:  Gr 3, IDC with apocrine features. ER (-), PR (31-40), HER-2 (3+). Noted to have multiple skin lesions. Poor access to breast mass due open 10-15 cm wound risk of pain and bleeding.  02/09/2016 Initial Diagnosis    Malignant neoplasm of overlapping sites of right female breast (RAF-HCC)     02/10/2016 Interval Scan(s)    CT CAP: Large, necrotic right breast mass with wide open tract to the skin, consistent with known right breast malignancy.  Numerous enlarged right axillary, subpectoral, mediastinal, bilateral hilar lymph nodes and innumerable bilateral pulmonary nodules     02/10/2016 Interval Scan(s)    NM Bone scan: No osseous metastatic disease.     04/28/2016 - 03/10/2020 Chemotherapy    OP TRASTUZUMAB (EVERY 21 DAYS)  Trastuzumab 8 mg/kg loading then 6 mg/kg every 21 days     07/13/2016 Genetics    STRATA  ??? ERBB2 copy number alteration  Estimated copy number: 40, confidence interval: 35.7 - 44.0, cellularity: 50%  Associated FDA-approved targeted therapies in breast cancer: trastuzumab, lapatinib, ado-trastuzumab emtansine, pertuzumab  ??? PIK3CA p.[id number]     01/03/2020 -  Cancer Staged    CT CAP 01/03/2020 which showed a new lingular opacity measuring 1.9 cm in the right lung. There was also slight increase in the right breast mass from 1.2-> 1.5 cm.  Bone scan shows no osseous metastases. Overall I considered these findings indeterminate and ppted to repeat CT CAP in 1 month     02/03/2020 -  Cancer Staged    CT chest 02/03/20 which showed that the lingular lesion has persisted and increased in size.  No mediastinal adenopathy reported.  She continues to report a nonproductive cough and mild dyspnea.  She is a chronic smoker and currently smokes on average half a pack per day.  My major concern is that this could be a second primary lung cancer as her other sites of diseases stable on current systemic therapy       02/03/2020 Endocrine/Hormone Therapy    Stop Tamoxifen, Add Fasoldex, continue Q3week Hercpetin     03/23/2020 - 02/09/2021 Chemotherapy    OP BREAST ADO-TRASTUZUMAB EMTANSINE  ado-trastuzumab emtansine 3.6 mg/kg IV on day 1, every 21 days     04/12/2021 -  Chemotherapy    Tucatinib + Capecitabine  OP TRASTUZUMAB (EVERY 21 DAYS)  trastuzumab 8 mg/kg IV LOADING, then 6 mg/kg IV MAINT, every 21 days     Brain metastases (CMS-HCC)   08/07/2017 Initial Diagnosis    Brain metastases (CMS-HCC)    Summary of Radiosurgery   Rx:7 brain met: 09/05/2017: 2,000/2,000 cGy  Sec:R Frontal: 09/05/2017: 2,000 cGy  Sec:L Post Fr: 09/05/2017: 2,000 cGy  Sec:L Ant Fro: 09/05/2017: 2,000 cGy  Sec:R Sup Oc: 09/05/2017: 2,000 cGy  Sec:L [place]: 09/05/2017: 2,000 cGy  Sec:R Inf Occi: 09/05/2017: 2,000 cGy  Sec:L Tempor: 09/05/2017: 2,000 cGy  Rx:Lt Med Oc: 09/13/2018: 2,000/2,000 cGy  Rx:Rt Frontal: : 2,500/2,500 cGy    01/15/2019: Craniotomy and resection of right posterior frontal lobe metastasis. Followed by [facility]    03/04/19: KPS 80; MRI with postsurgical changes versus residual disease; RTC in 2 weeks w/ MRI     01/03/2020 -  Cancer Staged    CT CAP 01/03/2020 which showed a new lingular opacity measuring 1.9 cm in the right lung. There was also slight increase in the right breast mass from 1.2-> 1.5 cm.  Bone scan shows no osseous metastases. Overall I considered these findings indeterminate and ppted to repeat CT CAP in 1 month     02/03/2020 -  Cancer Staged    CT chest 02/03/20 which showed that the lingular lesion has persisted and increased in size.  No mediastinal adenopathy  reported.  She continues to report a nonproductive cough and mild dyspnea.  She is a chronic smoker and currently smokes on average half a pack per day.  My major concern is that this could be a second primary lung cancer as her other sites of diseases stable on current systemic therapy       02/03/2020 Endocrine/Hormone Therapy    Stop Tamoxifen, Add Fasoldex, continue Q3week Hercpetin     Metastatic breast cancer (CMS-HCC)   01/25/2019 Initial Diagnosis    Metastatic breast cancer (CMS-HCC)     03/23/2020 - 02/09/2021 Chemotherapy    OP BREAST ADO-TRASTUZUMAB EMTANSINE  ado-trastuzumab emtansine 3.6 mg/kg IV on day 1, every 21 days     04/12/2021 -  Chemotherapy    Tucatinib + Capecitabine  OP TRASTUZUMAB (EVERY 21 DAYS)  trastuzumab 8 mg/kg IV LOADING, then 6 mg/kg IV MAINT, every 21 days         Patient Active Problem List   Diagnosis   ??? Malignant neoplasm of overlapping sites of right female breast (CMS-HCC)   ??? Peripheral neuropathy   ??? Insomnia   ??? Brain metastases (CMS-HCC)   ??? Nausea   ??? Closed fracture of one rib with routine healing   ??? Diarrhea of presumed infectious origin   ??? Failure to thrive in adult   ??? Tobacco use   ??? Chronic diarrhea   ??? Depression   ??? Hyponatremia   ??? Back pain   ??? Closed fracture of multiple pubic rami, right, initial encounter (CMS-HCC)   ??? Macrocytosis   ??? Pelvic fracture (CMS-HCC)   ??? Metastatic breast cancer (CMS-HCC)   ??? Migraine without aura and without status migrainosus, not intractable   ??? Essential hypertension   ??? CVA (cerebral vascular accident) (CMS-HCC)   ??? DDD (degenerative disc disease), cervical   ??? Wheezing   ??? Dyslipidemia   ??? Angina pectoris (CMS-HCC)   ??? Papillary fibroelastoma of heart   ??? Antineoplastic chemotherapy induced anemia   ??? Celiac disease   ??? Iron deficiency anemia due to chronic blood loss   ??? Dry eye syndrome, bilateral   ??? Meibomian gland dysfunction (MGD) of both eyes   ??? Glaucoma suspect of both eyes   ??? Incipient cataract of both eyes   ??? 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 Fatty liver   ??? Hypokalemia       Past Medical History:   Diagnosis Date   ??? Abnormal ECG unsure   ??? Alcoholism (CMS-HCC)    ??? Allergic rhinitis    ??? Anemia    ??? Anxiety     insomnia   ??? Arthritis not sure   ??? Brain concussion    ??? Breast cancer (CMS-HCC)     chemo for now with mets   ??? COPD (chronic obstructive pulmonary disease) (CMS-HCC)    ??? Depression    ??? Dysphagia    ??? Fatty liver 07/08/2021   ??? Genitourinary disease    ??? GERD (gastroesophageal reflux disease)    ??? Headache    ??? HTN (hypertension)    ??? Hyperlipidemia    ??? Insomnia    ??? 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 Stroke (CMS-HCC) Nov. 2020    Dr. Theodoro Kalata   ??? Tobacco dependence        Past Surgical History:   Procedure Laterality Date   ??? CESAREAN SECTION     ??? CHOLECYSTECTOMY     ???  FINGER SURGERY Right     trigger finger   ??? GANGLION CYST EXCISION Left     hand   ??? HYSTERECTOMY     ??? LYMPH NODE BIOPSY Right 02/08/2016    Axillary   ??? PR BRNSCHSC TNDSC EBUS DX/TX INTERVENTION PERPH LES Left 03/05/2020    Procedure: Bronch, Rigid Or Flexible, Including Fluoro Guidance, When Performed; With Transendoscopic Ebus During Bronchoscopic Diagnostic Or Therapeutic Intervention(S) For Peripheral Lesion(S);  Surgeon: Jerelyn Charles, MD;  Location: MAIN OR [facility];  Service: Pulmonary   ??? PR BRONCHOSCOPY,COMPUTER ASSIST/IMAGE-GUIDED NAVIGATION Left 03/05/2020    Procedure: BRONCHOSCOPY, RIGID OR FLEXIBLE, INCLUDE FLUORO WHEN PERFORMED; W/COMPUTER-ASSIST, IMAGE-GUIDED NAVIGATION;  Surgeon: Jerelyn Charles, MD;  Location: MAIN OR [facility];  Service: Pulmonary   ??? PR BRONCHOSCOPY,DIAGNOSTIC W LAVAGE Left 03/05/2020    Procedure: Bronchoscopy, Rigid Or Flexible, Include Fluoroscopic Guidance When Performed; W/Bronchial Alveolar Lavage;  Surgeon: Jerelyn Charles, MD;  Location: MAIN OR [facility];  Service: Pulmonary   ??? PR BRONCHOSCOPY,TRANSBRON ASPIR BX Left 03/05/2020    Procedure: Bronchoscopy, Rigid/Flex, Incl Fluoro; W/Transbronch Ndl Aspirat Bx, Trachea, Main Stem &/Or Lobar Bronchus;  Surgeon: Jerelyn Charles, MD;  Location: MAIN OR [facility];  Service: Pulmonary   ??? PR BRONCHOSCOPY,TRANSBRONCH BIOPSY Left 03/05/2020    Procedure: Bronchoscopy, Rigid/Flexible, Include Fluoro Guidance When Performed; W/Transbronchial Lung Bx, Single Lobe;  Surgeon: Jerelyn Charles, MD;  Location: MAIN OR [facility];  Service: Pulmonary   ??? PR CATH PLACE/CORON ANGIO, IMG SUPER/INTERP,W LEFT HEART VENTRICULOGRAPHY N/A 05/07/2020    Procedure: Left Heart Catheterization;  Surgeon: Lesle Reek, MD;  Location: [facility] CATH;  Service: Cardiology   ??? PR COLONOSCOPY W/BIOPSY SINGLE/MULTIPLE N/A 10/21/2016    Procedure: COLONOSCOPY, FLEXIBLE, PROXIMAL TO SPLENIC FLEXURE; WITH BIOPSY, SINGLE OR MULTIPLE;  Surgeon: Monte Fantasia, MD;  [facility]: GI PROCEDURES [facility];  Service: Gastroenterology   ??? PR EXCIS SUPRATENT BRAIN TUMOR Right 01/15/2019    Procedure: CRANIECTOMY; EXC BRAIN TUMOR-SUPRATENTORIAL;  Surgeon: Edison Simon, MD;  Location: MAIN OR [facility];  Service: Neurosurgery   ??? PR INCISE FINGER TENDON SHEATH Left 06/17/2019 Procedure: R-20 TENDON SHEATH INCISION (EG, FOR TRIGGER FINGER);  Surgeon: Daisy Lazar, MD;  Location: ASC OR [facility];  Service: Orthopedics   ??? PR MICROSURG TECHNIQUES,REQ OPER MICROSCOPE Right 01/15/2019    Procedure: MICROSURGICAL TECHNIQUES, REQUIRING USE OF OPERATING MICROSCOPE (LIST SEPARATELY IN ADDITION TO CODE FOR PRIMARY PROCEDURE);  Surgeon: Edison Simon, MD;  Location: MAIN OR [facility];  Service: Neurosurgery   ??? PR STEREOTACTIC COMP ASSIST PROC,CRANIAL,INTRADURAL Right 01/15/2019    Procedure: STEREOTACTIC COMPUTER-ASSISTED (NAVIGATIONAL) PROCEDURE; CRANIAL, INTRADURAL;  Surgeon: Edison Simon, MD;  Location: MAIN OR [facility];  Service: Neurosurgery   ??? PR UPPER GI ENDOSCOPY,BIOPSY N/A 10/21/2016    Procedure: UGI ENDOSCOPY; WITH BIOPSY, SINGLE OR MULTIPLE;  Surgeon: Monte Fantasia, MD;  [facility];  Service: Gastroenterology   ??? PR UPPER GI ENDOSCOPY,BIOPSY N/A 04/02/2018    Procedure: UGI ENDOSCOPY; WITH BIOPSY, SINGLE OR MULTIPLE;  Surgeon: Wendall Papa, MD;  [facility];  Service: Gastroenterology   ??? SKIN BIOPSY         Current Outpatient Medications   Medication Sig Dispense Refill   ??? albuterol HFA 90 mcg/actuation inhaler Inhale 2 puffs every six (6) hours as needed for wheezing. (Patient not taking: Reported on 12/24/2021)     ??? aluminum-magnesium hydroxide-simethicone 200-200-20 mg/5 mL Susp 80 mL, diphenhydrAMINE 12.5 mg/5 mL Liqd 200 mg,  nystatin 100,000 unit/mL Susp 8,000,000 Units, distilled water Liqd 80 mL, lidocaine 2% viscous 2 % Soln 80 mL Take 5 mL by mouth every six (6) hours as needed. Swish, gargle, spit or swallow if throat issues (Patient not taking: Reported on 12/24/2021) 80 mL 2   ??? capecitabine (XELODA) 500 MG tablet Take 1 tablet (500 mg total) by mouth two (2) times a day, continuously. 60 tablet 5   ??? clobetasoL (TEMOVATE) 0.05 % ointment Apply twice a day to rash on palm until smooth/clear. 30 g 1   ??? clonazePAM (KLONOPIN) 0.5 MG tablet Take 0.5 mg daily as needed for anxiety.  Do not exceed 0.5 mg/day. 30 tablet 0   ??? diclofenac (VOLTAREN) 50 MG EC tablet Take 1 tablet (50 mg total) by mouth two (2) times a day as needed. With food (Patient not taking: Reported on 12/24/2021) 60 tablet 1   ??? diclofenac sodium (VOLTAREN) 1 % gel Apply 2 g topically four (4) times a day as needed for arthritis or pain. 150 g 2   ??? diphenoxylate-atropine (LOMOTIL) 2.5-0.025 mg per tablet Take 1 tablet by mouth 4 (four) times a day as needed for diarrhea. 30 tablet 1   ??? dorzolamide (TRUSOPT) 2 % ophthalmic solution Administer 1 drop into the left eye Three (3) times a day. 10 mL 12   ??? esomeprazole (NEXIUM) 40 MG capsule Take 1 capsule (40 mg total) by mouth in the morning. 90 capsule 3   ??? FARXIGA 5 mg Tab tablet Take 5 mg by mouth daily.     ??? HYDROcodone-acetaminophen (NORCO 10-325) 10-325 mg per tablet Take 1 tablet by mouth every twelve (12) hours as needed for pain. 60 tablet 0   ??? lidocaine (LIDODERM) 5 % patch Place 1 patch on the skin daily. Apply to affected area for 12 hours only each day (then remove patch) 30 patch 3   ??? lisinopriL-hydrochlorothiazide (PRINZIDE,ZESTORETIC) 20-25 mg per tablet Take 1 tablet by mouth daily. 90 tablet 3   ??? MAGNESIUM ORAL Take by mouth daily. OTC (Patient not taking: Reported on 12/24/2021)     ??? naloxone (NARCAN) 4 mg nasal spray One spray in either nostril once for known/suspected opioid overdose. May repeat every 2-3 minutes in alternating nostril til EMS arrives (Patient not taking: Reported on 11/11/2021) 2 each 0   ??? ondansetron (ZOFRAN) 4 MG tablet Take 1 tablet (4 mg total) by mouth every six (6) hours as needed for nausea. 30 tablet 1   ??? potassium chloride (KLOR-CON) 20 mEq packet Take 1 packet (20 mEq total) by mouth Two (2) times a day. 60 packet 0   ??? pregabalin (LYRICA) 200 MG capsule Take 1 capsule (200 mg total) by mouth Two (2) times a day. 60 capsule 2   ??? prochlorperazine (COMPAZINE) 10 MG tablet Take 1 tablet (10 mg total) by mouth every six (6) hours as needed for nausea. 30 tablet 6   ??? senna (SENOKOT) 8.6 mg tablet Take 3 tablets by mouth in the morning. 30 tablet 2   ??? thiamine (VITAMIN B-1) 50 MG tablet Take 1 tablet (50 mg total) by mouth in the morning. 90 tablet 1   ??? traZODone (DESYREL) 50 MG tablet Take 1.5 tablets (75 mg total) by mouth nightly. 45 tablet 1   ??? tucatinib (TUKYSA) 150 mg tablet Take 1 tablet (150 mg total) by mouth two (2) times a day. 60 tablet 1   ??? tucatinib (TUKYSA) 50 mg tablet Take 2 tablets (  100 mg total) by mouth two (2) times a day . 120 tablet 3   ??? venlafaxine (EFFEXOR-XR) 150 MG 24 hr capsule Take 2 capsules (300 mg total) by mouth daily. 60 capsule 5     No current facility-administered medications for this visit.       Allergies:   Allergies   Allergen Reactions   ??? Adhesive Rash   ??? Decadron [Dexamethasone] Anxiety     Mania as well   ??? Suboxone [Buprenorphine-Naloxone] Hallucinations   ??? Tetracycline      Other reaction(s): Other (See Comments)   ??? Oxycodone      Night terrors   ??? Skin Protectants, Misc. Rash   ??? Tegaderm Adhesive-No Drug-Allergy Check Rash       Family History:  Cancer-related family history includes Cancer in her father, maternal grandfather, maternal uncle, maternal uncle, and paternal aunt.  She indicated that the status of her mother is unknown. She indicated that the status of her father is unknown. She indicated that the status of her brother is unknown. She indicated that the status of her maternal grandmother is unknown. She indicated that the status of her maternal grandfather is unknown. She indicated that the status of her paternal grandmother is unknown. She indicated that the status of her paternal grandfather is unknown. She indicated that the status of her maternal aunt is unknown. She indicated that the status of her paternal uncle is unknown. She indicated that the status of her neg hx is unknown. She indicated that the status of her other is unknown.           Lab Results   Component Value Date    CREATININE 0.72 12/24/2021     Lab Results   Component Value Date    ALKPHOS 186 (H) 12/24/2021    BILITOT 0.8 12/24/2021    BILIDIR 0.30 03/29/2021    PROT 7.2 12/24/2021    ALBUMIN 4.0 12/24/2021    ALT 10 12/24/2021    AST 33 12/24/2021                  Burtis Junes, FNP-BC, [facility]  Outpatient Oncology Palliative Care Service  [facility]  [address], [place], [place] [address]  [phone]                 The patient reports they are currently: at home. I spent 11 minutes on the real-time audio and video and 17 minutes on phone due to poor audio connection with the patient on the date of service. I spent an additional 10 minutes on pre- and post-visit activities on the date of service.     The patient was physically located in [place] or a state in which I am permitted to provide care. The patient and/or parent/guardian understood that s/he may incur co-pays and cost sharing, and agreed to the telemedicine visit. The visit was reasonable and appropriate under the circumstances given the patient's presentation at the time.    The patient and/or parent/guardian has been advised of the potential risks and limitations of this mode of treatment (including, but not limited to, the absence of in-person examination) and has agreed to be treated using telemedicine. The patient's/patient's family's questions regarding telemedicine have been answered.     If the visit was completed in an ambulatory setting, the patient and/or parent/guardian has also been advised to contact their provider???s office for worsening conditions, and seek emergency medical treatment and/or call 911 if the  patient deems either necessary.

## 2021-12-31 DIAGNOSIS — E876 Hypokalemia: Principal | ICD-10-CM

## 2021-12-31 MED ORDER — MIRTAZAPINE 7.5 MG TABLET
ORAL_TABLET | Freq: Every evening | ORAL | 1 refills | 30 days | Status: CP
Start: 2021-12-31 — End: ?

## 2022-01-07 DIAGNOSIS — C50811 Malignant neoplasm of overlapping sites of right female breast: Principal | ICD-10-CM

## 2022-01-07 MED ORDER — TUKYSA 50 MG TABLET
ORAL_TABLET | Freq: Two times a day (BID) | ORAL | 3 refills | 0.00000 days | Status: CP
Start: 2022-01-07 — End: ?
  Filled 2022-01-20: qty 120, 30d supply, fill #0

## 2022-01-07 MED ORDER — TUCATINIB 150 MG TABLET
ORAL_TABLET | Freq: Two times a day (BID) | ORAL | 1 refills | 30 days | Status: CP
Start: 2022-01-07 — End: ?

## 2022-01-07 NOTE — Unmapped (Signed)
[facility] Refill Coordination Note    Specialty Medication(s) to be Shipped:   Hematology/Oncology: Brooke [name] 150mg  + 50mg  and Capecitabine 500mg , directions: Take 1 tablet (500 mg total) by mouth two (2) times a day, continuously.    Other medication(s) to be shipped: No additional medications requested for fill at this time     Brooke [name], DOB: Nov 21, 1956  Phone: [phone] (home)       All above HIPAA information was verified with patient.     Was a Nurse, learning disability used for this call? No    Completed refill call assessment today to schedule patient's medication shipment from the [facility] [phone]).  All relevant notes have been reviewed.     Specialty medication(s) and dose(s) confirmed: Regimen is correct and unchanged.   Changes to medications: [name] reports starting the following medications: Klor-Con packets and Remeron  Changes to insurance: No  New side effects reported not previously addressed with a pharmacist or physician: None reported  Questions for the pharmacist: No    Confirmed patient received a Conservation officer, historic buildings and a Surveyor, mining with first shipment. The patient will receive a drug information handout for each medication shipped and additional FDA Medication Guides as required.       DISEASE/MEDICATION-SPECIFIC INFORMATION        N/A    SPECIALTY MEDICATION ADHERENCE     Medication Adherence    Patient reported X missed doses in the last month: 0  Specialty Medication: capecitabine 500 MG tablet (XELODA)  Patient is on additional specialty medications: Yes  Additional Specialty Medications: TUKYSA 50 mg tablet (tucatinib)  Patient Reported Additional Medication X Missed Doses in the Last Month: 0  Patient is on more than two specialty medications: Yes  Specialty Medication: TUKYSA 150 mg tablet (tucatinib)  Patient Reported Additional Medication X Missed Doses in the Last Month: 0  Informant: patient              Were doses missed due to medication being on hold? No    TUKYSA  150 mg: 14 days of medicine on hand   TUKYSA  50 mg: 14 days of medicine on hand   Capecitabine 500 mg: 14 days of medicine on hand       REFERRAL TO PHARMACIST     Referral to the pharmacist: Not needed      SHIPPING     Shipping address confirmed in Epic.     Delivery Scheduled: Yes, Expected medication delivery date: 01/21/22.  However, Rx request for refills was sent to the provider as there are none remaining.     Medication will be delivered via Next Day Courier to the prescription address in [employer] [place].    [name]   [facility] Specialty Technician

## 2022-01-14 ENCOUNTER — Ambulatory Visit: Admit: 2022-01-14 | Discharge: 2022-01-15 | Payer: MEDICARE

## 2022-01-14 DIAGNOSIS — K900 Celiac disease: Principal | ICD-10-CM

## 2022-01-14 DIAGNOSIS — C50811 Malignant neoplasm of overlapping sites of right female breast: Principal | ICD-10-CM

## 2022-01-14 DIAGNOSIS — C7802 Secondary malignant neoplasm of left lung: Principal | ICD-10-CM

## 2022-01-14 DIAGNOSIS — R110 Nausea: Principal | ICD-10-CM

## 2022-01-14 DIAGNOSIS — E876 Hypokalemia: Principal | ICD-10-CM

## 2022-01-14 DIAGNOSIS — C50919 Malignant neoplasm of unspecified site of unspecified female breast: Principal | ICD-10-CM

## 2022-01-14 LAB — COMPREHENSIVE METABOLIC PANEL
ALBUMIN: 4.1 g/dL (ref 3.4–5.0)
ALKALINE PHOSPHATASE: 184 U/L — ABNORMAL HIGH (ref 46–116)
ALT (SGPT): 16 U/L (ref 10–49)
ANION GAP: 5 mmol/L (ref 5–14)
AST (SGOT): 37 U/L — ABNORMAL HIGH (ref <=34)
BILIRUBIN TOTAL: 0.6 mg/dL (ref 0.3–1.2)
BLOOD UREA NITROGEN: 7 mg/dL — ABNORMAL LOW (ref 9–23)
BUN / CREAT RATIO: 9
CALCIUM: 9.4 mg/dL (ref 8.7–10.4)
CHLORIDE: 112 mmol/L — ABNORMAL HIGH (ref 98–107)
CO2: 24.9 mmol/L (ref 20.0–31.0)
CREATININE: 0.76 mg/dL
EGFR CKD-EPI (2021) FEMALE: 88 mL/min/{1.73_m2} (ref >=60)
GLUCOSE RANDOM: 85 mg/dL (ref 70–179)
POTASSIUM: 2.9 mmol/L — ABNORMAL LOW (ref 3.4–4.8)
PROTEIN TOTAL: 7.4 g/dL (ref 5.7–8.2)
SODIUM: 142 mmol/L (ref 135–145)

## 2022-01-14 LAB — CBC W/ AUTO DIFF
BASOPHILS ABSOLUTE COUNT: 0 10*9/L (ref 0.0–0.1)
BASOPHILS RELATIVE PERCENT: 0.3 %
EOSINOPHILS ABSOLUTE COUNT: 0 10*9/L (ref 0.0–0.5)
EOSINOPHILS RELATIVE PERCENT: 0.5 %
HEMATOCRIT: 42.9 % (ref 34.0–44.0)
HEMOGLOBIN: 14.9 g/dL (ref 11.3–14.9)
LYMPHOCYTES ABSOLUTE COUNT: 2.2 10*9/L (ref 1.1–3.6)
LYMPHOCYTES RELATIVE PERCENT: 31.6 %
MEAN CORPUSCULAR HEMOGLOBIN CONC: 34.8 g/dL (ref 32.0–36.0)
MEAN CORPUSCULAR HEMOGLOBIN: 39.5 pg — ABNORMAL HIGH (ref 25.9–32.4)
MEAN CORPUSCULAR VOLUME: 113.5 fL — ABNORMAL HIGH (ref 77.6–95.7)
MEAN PLATELET VOLUME: 8 fL (ref 6.8–10.7)
MONOCYTES ABSOLUTE COUNT: 0.7 10*9/L (ref 0.3–0.8)
MONOCYTES RELATIVE PERCENT: 9.6 %
NEUTROPHILS ABSOLUTE COUNT: 4 10*9/L (ref 1.8–7.8)
NEUTROPHILS RELATIVE PERCENT: 58 %
NUCLEATED RED BLOOD CELLS: 0 /100{WBCs} (ref <=4)
PLATELET COUNT: 157 10*9/L (ref 150–450)
RED BLOOD CELL COUNT: 3.78 10*12/L — ABNORMAL LOW (ref 3.95–5.13)
RED CELL DISTRIBUTION WIDTH: 15.5 % — ABNORMAL HIGH (ref 12.2–15.2)
WBC ADJUSTED: 6.9 10*9/L (ref 3.6–11.2)

## 2022-01-14 LAB — MAGNESIUM: MAGNESIUM: 1.8 mg/dL (ref 1.6–2.6)

## 2022-01-14 MED ORDER — POTASSIUM CHLORIDE 20 MEQ ORAL PACKET
PACK | Freq: Two times a day (BID) | ORAL | 0 refills | 30 days | Status: CP
Start: 2022-01-14 — End: ?

## 2022-01-14 MED ADMIN — potassium chloride (KLOR-CON) packet 40 mEq: 40 meq | ORAL | @ 16:00:00 | Stop: 2022-01-14

## 2022-01-14 MED ADMIN — potassium chloride 20 mEq in 100 mL IVPB Premix: 20 meq | INTRAVENOUS | @ 16:00:00 | Stop: 2022-01-14

## 2022-01-14 MED ADMIN — heparin, porcine (PF) 100 unit/mL injection 500 Units: 500 [IU] | INTRAVENOUS | @ 17:00:00 | Stop: 2022-01-14

## 2022-01-14 MED ADMIN — trastuzumab (HERCEPTIN) 340 mg in sodium chloride (NS) 0.9 % 250 mL IVPB: 6 mg/kg | INTRAVENOUS | @ 15:00:00 | Stop: 2022-01-14

## 2022-01-14 MED ADMIN — sodium chloride (NS) 0.9 % infusion: 100 mL/h | INTRAVENOUS | @ 15:00:00 | Stop: 2022-01-14

## 2022-01-14 NOTE — Unmapped (Signed)
Infusion on 01/14/2022   Component Date Value Ref Range Status    Sodium 01/14/2022 142  135 - 145 mmol/L Final    Potassium 01/14/2022 2.9 (L)  3.4 - 4.8 mmol/L Final    Chloride 01/14/2022 112 (H)  98 - 107 mmol/L Final    CO2 01/14/2022 24.9  20.0 - 31.0 mmol/L Final    Anion Gap 01/14/2022 5  5 - 14 mmol/L Final    BUN 01/14/2022 7 (L)  9 - 23 mg/dL Final    Creatinine 14/78/2956 0.76  0.60 - 0.80 mg/dL Final    BUN/Creatinine Ratio 01/14/2022 9   Final    eGFR CKD-EPI (2021) Female 01/14/2022 88  >=60 mL/min/1.68m2 Final    eGFR calculated with CKD-EPI 2021 equation in accordance with [employer] and [employer] of Nephrology Task Force recommendations.    Glucose 01/14/2022 85  70 - 179 mg/dL Final    Calcium 21/30/8657 9.4  8.7 - 10.4 mg/dL Final    Albumin 84/69/6295 4.1  3.4 - 5.0 g/dL Final    Total Protein 01/14/2022 7.4  5.7 - 8.2 g/dL Final    Total Bilirubin 01/14/2022 0.6  0.3 - 1.2 mg/dL Final    AST 28/41/3244 37 (H)  <=34 U/L Final    ALT 01/14/2022 16  10 - 49 U/L Final    Alkaline Phosphatase 01/14/2022 184 (H)  46 - 116 U/L Final    Magnesium 01/14/2022 1.8  1.6 - 2.6 mg/dL Final    WBC 12/28/7251 6.9  3.6 - 11.2 10*9/L Final    RBC 01/14/2022 3.78 (L)  3.95 - 5.13 10*12/L Final    HGB 01/14/2022 14.9  11.3 - 14.9 g/dL Final    HCT 66/44/0347 42.9  34.0 - 44.0 % Final    MCV 01/14/2022 113.5 (H)  77.6 - 95.7 fL Final    MCH 01/14/2022 39.5 (H)  25.9 - 32.4 pg Final    MCHC 01/14/2022 34.8  32.0 - 36.0 g/dL Final    RDW 42/59/5638 15.5 (H)  12.2 - 15.2 % Final    MPV 01/14/2022 8.0  6.8 - 10.7 fL Final    Platelet 01/14/2022 157  150 - 450 10*9/L Final    nRBC 01/14/2022 0  <=4 /100 WBCs Final    Neutrophils % 01/14/2022 58.0  % Final    Lymphocytes % 01/14/2022 31.6  % Final    Monocytes % 01/14/2022 9.6  % Final    Eosinophils % 01/14/2022 0.5  % Final    Basophils % 01/14/2022 0.3  % Final    Absolute Neutrophils 01/14/2022 4.0  1.8 - 7.8 10*9/L Final    Absolute Lymphocytes 01/14/2022 2.2  1.1 - 3.6 10*9/L Final    Absolute Monocytes 01/14/2022 0.7  0.3 - 0.8 10*9/L Final    Absolute Eosinophils 01/14/2022 0.0  0.0 - 0.5 10*9/L Final    Absolute Basophils 01/14/2022 0.0  0.0 - 0.1 10*9/L Final    Macrocytosis 01/14/2022 Moderate (A)  Not Present Final

## 2022-01-14 NOTE — Unmapped (Signed)
VS taken. Labs drawn via [place].  Port flushed and brisk blood return noted.  No premed per plan.IV fluid given.    Pt received Trastuzumab - no adverse events noted.   Potassium 20 meq also given IV for a K+ level of 2.9.  40 meq PO given as well.     AVS given.  Pt stable and ambulated independently from clinic accompanied by female family member.  No concerns voiced.  VWilliams,RN.

## 2022-01-14 NOTE — Unmapped (Signed)
[facility] Outpatient Nutrition Services   Medical Nutrition Therapy Consultation       Visit Type:    Return Assessment    Referral Reason:   Nutrition follow-up    Brooke [name] is a 66 y.o. female seen for medical nutrition therapy follow-up for weight loss and decreased intake in the setting of metastatic breast cancer to the lung and brain. Current therapy consists of Xeloda, tucatinib and herceptin. She was seen with her sister present at time of visit. Appetite and intake continues to be decreased. Her medication list, allergies, notes from last several encounters and lab results were reviewed.     Past Medical History:   Diagnosis Date   ??? Abnormal ECG unsure   ??? Alcoholism (CMS-HCC)    ??? Allergic rhinitis    ??? Anemia    ??? Anxiety     insomnia   ??? Arthritis not sure   ??? Brain concussion    ??? Breast cancer (CMS-HCC)     chemo for now with mets   ??? COPD (chronic obstructive pulmonary disease) (CMS-HCC)    ??? Depression    ??? Dysphagia    ??? Fatty liver 07/08/2021   ??? Genitourinary disease    ??? GERD (gastroesophageal reflux disease)    ??? Headache    ??? HTN (hypertension)    ??? Hyperlipidemia    ??? Insomnia    ??? 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 Stroke (CMS-HCC) Nov. 2020    Dr. Theodoro Kalata   ??? Tobacco dependence        Anthropometrics   Height:   157.5 cm  Current Weight:   51.5 (kg) 01/14/22  BMI: 20.76 kg/m2   Ideal Body Weight:  50 (kg)   Percent Ideal Body Weight: 103%    Weight history:  Wt Readings from Last 6 Encounters:   01/14/22 51.5 kg (113 lb 8 oz)   12/24/21 52.5 kg (115 lb 11.9 oz)   12/24/21 52.3 kg (115 lb 3.2 oz)   11/26/21 55.3 kg (121 lb 14.6 oz)   11/11/21 54 kg (119 lb)   11/05/21 55.3 kg (122 lb)     Nutrition Risk Screening:   Food Insecurity: Food Insecurity Present   ??? Worried About Programme researcher, broadcasting/film/video in the Last Year: Sometimes true   ??? Ran Out of Food in the Last Year: Sometimes true     ASPEN/AND Malnutrition Screening:   Pt meets ASPEN/AND criteria for malnutrition as noted below:  Malnutrition Designation: Non-severe in context of chronic illness  -- Weight loss: does not meet criteria for weight loss  -- Energy Intake: intake <75% for > 1 month  -- Muscle Mass: mild interosseous wasting noted, mild temporal wasting noted  -- Fat Mass: mild orbital wasting  -- Fluid Accumulation: did not assess  -- Functional Assessment: did not assess    Biochemical Data, Medical Tests and Procedures:  All pertinent labs and imaging reviewed by Genevie Ann at 12:54 PM 01/14/2022.  She has been unable to take her potassium supplement due to the size of the pill-noted to be addressed by NP today.    Medications and Vitamin/Mineral Supplementation:   All nutritionally pertinent medications reviewed on 01/14/2022.   She is not taking nutrition supplements. But plans to get a multivitamin    Current Outpatient Medications   Medication Sig Dispense Refill   ??? albuterol HFA 90 mcg/actuation inhaler Inhale 2 puffs every six (6) hours  as needed for wheezing. (Patient not taking: Reported on 12/24/2021)     ??? aluminum-magnesium hydroxide-simethicone 200-200-20 mg/5 mL Susp 80 mL, diphenhydrAMINE 12.5 mg/5 mL Liqd 200 mg, nystatin 100,000 unit/mL Susp 8,000,000 Units, distilled water Liqd 80 mL, lidocaine 2% viscous 2 % Soln 80 mL Take 5 mL by mouth every six (6) hours as needed. Swish, gargle, spit or swallow if throat issues (Patient not taking: Reported on 12/24/2021) 80 mL 2   ??? capecitabine (XELODA) 500 MG tablet Take 1 tablet (500 mg total) by mouth two (2) times a day, continuously. 60 tablet 5   ??? clobetasoL (TEMOVATE) 0.05 % ointment Apply twice a day to rash on palm until smooth/clear. 30 g 1   ??? clonazePAM (KLONOPIN) 0.5 MG tablet Take 0.5 mg daily as needed for anxiety.  Do not exceed 0.5 mg/day. 30 tablet 0   ??? diclofenac sodium (VOLTAREN) 1 % gel Apply 2 g topically four (4) times a day as needed for arthritis or pain. 150 g 2   ??? diphenoxylate-atropine (LOMOTIL) 2.5-0.025 mg per tablet Take 1 tablet by mouth 4 (four) times a day as needed for diarrhea. 30 tablet 1   ??? dorzolamide (TRUSOPT) 2 % ophthalmic solution Administer 1 drop into the left eye Three (3) times a day. 10 mL 12   ??? esomeprazole (NEXIUM) 40 MG capsule Take 1 capsule (40 mg total) by mouth in the morning. 90 capsule 3   ??? FARXIGA 5 mg Tab tablet Take 5 mg by mouth daily.     ??? HYDROcodone-acetaminophen (NORCO 10-325) 10-325 mg per tablet Take 1 tablet by mouth every twelve (12) hours as needed for pain. 60 tablet 0   ??? lidocaine (LIDODERM) 5 % patch Place 1 patch on the skin daily. Apply to affected area for 12 hours only each day (then remove patch) 30 patch 3   ??? lisinopriL-hydrochlorothiazide (PRINZIDE,ZESTORETIC) 20-25 mg per tablet Take 1 tablet by mouth daily. 90 tablet 3   ??? MAGNESIUM ORAL Take by mouth daily. OTC (Patient not taking: Reported on 12/24/2021)     ??? mirtazapine (REMERON) 7.5 MG tablet Take 1 tablet (7.5 mg total) by mouth nightly. 30 tablet 1   ??? naloxone (NARCAN) 4 mg nasal spray One spray in either nostril once for known/suspected opioid overdose. May repeat every 2-3 minutes in alternating nostril til EMS arrives (Patient not taking: Reported on 11/11/2021) 2 each 0   ??? ondansetron (ZOFRAN) 4 MG tablet Take 1 tablet (4 mg total) by mouth every six (6) hours as needed for nausea. 30 tablet 1   ??? potassium chloride (KLOR-CON) 20 mEq packet Take 1 packet (20 mEq total) by mouth Two (2) times a day. 60 packet 0   ??? pregabalin (LYRICA) 200 MG capsule Take 1 capsule (200 mg total) by mouth Two (2) times a day. 60 capsule 2   ??? prochlorperazine (COMPAZINE) 10 MG tablet Take 1 tablet (10 mg total) by mouth every six (6) hours as needed for nausea. 30 tablet 6   ??? senna (SENOKOT) 8.6 mg tablet Take 3 tablets by mouth in the morning. 30 tablet 2   ??? thiamine (VITAMIN B-1) 50 MG tablet Take 1 tablet (50 mg total) by mouth in the morning. 90 tablet 1   ??? traZODone (DESYREL) 50 MG tablet Take 1.5 tablets (75 mg total) by mouth nightly. 45 tablet 1   ??? tucatinib (TUKYSA) 150 mg tablet Take 1 tablet (150 mg total) by mouth two (2) times a  day. 60 tablet 1   ??? tucatinib (TUKYSA) 50 mg tablet Take 2 tablets (100 mg total) by mouth two (2) times a day . 120 tablet 3   ??? venlafaxine (EFFEXOR-XR) 150 MG 24 hr capsule Take 2 capsules (300 mg total) by mouth daily. 60 capsule 5     No current facility-administered medications for this visit.     Facility-Administered Medications Ordered in Other Visits   Medication Dose Route Frequency Provider Last Rate Last Admin   ??? heparin, porcine (PF) 100 unit/mL injection 500 Units  500 Units Intravenous Q30 Min PRN Olivia Mackie, MD   500 Units at 01/14/22 1010   ??? OKAY TO SEND MEDICATION/CHEMOTHERAPY TO OUTPATIENT UNIT   Other Once Clarissa Trixie Dredge, PA       ??? potassium chloride 20 mEq in 100 mL IVPB Premix  20 mEq Intravenous Q1H PRN Saunders Glance, AGNP       ??? sodium chloride (NS) 0.9 % infusion  100 mL/hr Intravenous Continuous Clarissa Trixie Dredge, PA 100 mL/hr at 01/14/22 1010 100 mL/hr at 01/14/22 1010       Nutrition History:     Dietary Restrictions: She reported intolerance to gluten containing foods in setting of celiac disease. She reported the dx of celiac. She is unsure if she has an issue with cross contamination of gluten free and gluten containing foods.     Gastrointestinal Issues: Diarrhea- this has been ongoing up until the day before yesterday. Daily episodes of diarrhea with some days diarrhea occurring frequently throughout the day. She reported taking 1 lomotil and this stopped the diarrhea this week. She also noted difficulty with reflux. Yesterday felt like something was stuck in between her stomach and throat all day and this occurred before she ate and lasted until this morning. She did report taking her medication for reflux.    Hunger and Satiety: Poor appetite and limited intake.     Food Safety and Access: She expressed limited access to food. Patient provided with groceries today through the food well program.    Diet Recall:   She only ate eggs and bacon yesterday     Food-Related History:  She is not cooking as much as she burnt herself a few times. Her other sister is currently hospitalized so she has been visiting daily, bringing her sister lunch as her sister does not like hospital food. She did note less time to take care of herself.    She does not eat bread due to celiac disease and purchased potato bread but did not like the taste. She is reading labels more but voiced being unsure of how to best eat in setting of celiac disease.    Nutrition Assessment       Inadequate oral intake related to metastatic breast cancer and social, enviromental and financial factors as evidenced by eating ~ 1 meal daily, unable to cook often, food insecurity reported and 7.5% weight loss noted x 4.5 months-continues    Non-severe (moderate) malnutrition related to metastatic breast cancer as evidenced by intake <75% for > 1 month and muscle and fat loss noted.  ??  Daily Estimated Nutritional Needs: Weight of 52.5 (kg) obtained 12/24/21 used to calculate needs  Energy:  1600-1850 kcals [  using  30-35kcal/kg  ]  Protein:   63-73 gm [  using  1.2-1.4g/kg  ]  Fluid:   1600-1866ml [ 61ml/kcal]    Nutrition Goals & Evaluation      Intake will  aim to meet >75% of estimated nutrient needs  (Not Met and Ongoing)  Aim for weight maintenance  (Not Met and Ongoing)    Nutrition goals reviewed, and relevant barriers identified and addressed: acuity of illness, emotional state and financial concerns. She is evaluated to have fair willingness and ability to achieve nutrition goals.     Nutrition Intervention      - Nutrition Education: Gluten Free diet/eating style. Education resources provided include: handouts promoting nutrition intervention   - Food well program      Nutrition Plan:   ??? Provided education on label reading and how to tell if a food contains gluten. Provided several handouts on label reading and diet education for celiac disease.   ??? Recommended a general multivitamin due to poor intake. Discussed B complex general multivitamin if desired as she may be more at risk for deficiencies with certain B vitamins in setting of hx alcohol use and Celiacs disease.   ??? She was previously enrolled in the Food Well program at [facility] and groceries provided to patient today. Reviewed groceries that were gluten free with patient.    Follow up will occur in 3 weeks during next infusion as able, as desired by patient.       Food/Nutrition-related history, Anthropometric measurements, Biochemical data, medical tests, procedures, Nutrition-focused physical findings, Patient understanding or compliance with intervention and recommendations  and Effectiveness of nutrition interventions will be assessed at time of follow-up.     Recommendations for Care Team :  Encourage continuing to be mindful of label reading and gluten containing foods.   Recommend multivitamin due to poor intake at this time.       Time spent 22 minutes.

## 2022-01-14 NOTE — Unmapped (Signed)
Brief Adult Oncology Infusion Clinic Note:    Ms. Venning is here today for c13d1 op trastuzumab. Labs are notable for hypokalemia 2.9. She notes that she has not been taking her home dose of potassium because the pills are too big and difficult to swallow.   - will replete today with potassium chloride 20 meq IV and 40 meq oral once   - prescription for potassium chloride packets sent to local pharmacy             Kennon Portela, AGNP-BC  Adult Oncology Infusion Center

## 2022-01-20 ENCOUNTER — Ambulatory Visit: Admit: 2022-01-20 | Discharge: 2022-01-24 | Payer: MEDICARE

## 2022-01-20 ENCOUNTER — Ambulatory Visit: Admit: 2022-01-20 | Discharge: 2022-01-20 | Payer: MEDICARE

## 2022-01-20 ENCOUNTER — Ambulatory Visit: Admit: 2022-01-20 | Discharge: 2022-01-21 | Payer: MEDICARE

## 2022-01-20 MED ADMIN — iohexoL (OMNIPAQUE) 350 mg iodine/mL solution 100 mL: 100 mL | INTRAVENOUS | @ 14:00:00 | Stop: 2022-01-20

## 2022-01-20 MED ADMIN — heparin, porcine (PF) 100 unit/mL injection 500 Units: 500 [IU] | INTRAVENOUS | @ 14:00:00 | Stop: 2022-01-21

## 2022-01-20 MED ADMIN — Technetium Tc-99m Oxidronate HDP: 21.4 | INTRAVENOUS | @ 14:00:00 | Stop: 2022-01-20

## 2022-01-20 MED FILL — CAPECITABINE 500 MG TABLET: ORAL | 30 days supply | Qty: 60 | Fill #5

## 2022-01-20 MED FILL — TUKYSA 150 MG TABLET: ORAL | 30 days supply | Qty: 60 | Fill #0

## 2022-01-21 ENCOUNTER — Ambulatory Visit: Admit: 2022-01-21 | Discharge: 2022-01-22 | Payer: MEDICARE | Attending: Medical Oncology | Primary: Medical Oncology

## 2022-01-21 DIAGNOSIS — E039 Hypothyroidism, unspecified: Principal | ICD-10-CM

## 2022-01-21 DIAGNOSIS — C50919 Malignant neoplasm of unspecified site of unspecified female breast: Principal | ICD-10-CM

## 2022-01-21 DIAGNOSIS — C50811 Malignant neoplasm of overlapping sites of right female breast: Principal | ICD-10-CM

## 2022-01-21 DIAGNOSIS — K900 Celiac disease: Principal | ICD-10-CM

## 2022-01-21 DIAGNOSIS — F5101 Primary insomnia: Principal | ICD-10-CM

## 2022-01-21 DIAGNOSIS — R110 Nausea: Principal | ICD-10-CM

## 2022-01-21 DIAGNOSIS — Z171 Estrogen receptor negative status [ER-]: Principal | ICD-10-CM

## 2022-01-21 MED ORDER — TRAZODONE 50 MG TABLET
ORAL_TABLET | Freq: Every evening | ORAL | 1 refills | 30.00000 days | Status: CP
Start: 2022-01-21 — End: ?

## 2022-01-21 NOTE — Unmapped (Signed)
[facility]  -----------------------------------------------------------------------------------------------------  Follow-up Outpatient Evaluation    PCP: [name], MD     Oncology team: Surgical oncology: Tyson Alias M.D.  Kerrin Champagne ---------->   Alan Ripper Noris Kulinski/ [name]     Reason for Visit: Here for management of breast cancer.  -----------------------------------------------------------------------------------------------------    Assessment:  Brooke [name] is a 66 y.o. 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 She was switched d/t abnormal LFT's (from ETOH/NASH cirrhosis) on TDM1 (not progression).     Brooke [name] presents today fo evaluation while on Xeloda, tucatinib + Herceptin. Overall, she is doing alright though she has been having chronic difficulties with recurrent diarrhea (~10 times daily at most severe) and resultant hypokalemia (potassium 2.9 on 01/14/22). She is currently taking Klorcon 20 MEQ daily, which we will increase to 30 MEQ daily. We will also dose reduce her tucatinib to 200 mg bid from her present dose of 250 mg bid and assess for empiric improvement in diarrhea symptoms. She will submit refills to [place] via MyChart when necessary.     Otherwise, she has no s/s of disease progression. She will continue on Xeloda, tucatinib, and Herceptin, next infusion in 2 weeks. We will see her back in 5 weeks. She will reach out in the interim if she has any questions or concerns.     1. Metastatic breast cancer, ER-, PR+, HER-2 positive to lung, br  A. Systemic therapy    First-line: THP. Taxol d/c for G3 neuropathy.  Pertuzumab d/c for G3 diarrhea.  Not progression.   Second line: Trastuzumab + AI -->Tamoxifen 2/2 arthralgias.   Third line: Rebiopsy ER -/PR 10%/HER-2 +ve. C1 TDM1 03/23/20 limited by neuropathy. 09/01/20: DR TDM1 to 2.4 mg/kg. Discontinue TDM1 d/t abnormal LFT's and not progression.   Fourth line:  Herceptin + tucatinib + capecitabine (Xeloda). 05/20/21 DR capecitabine to 1000 mg BID, and tucatinib to 250 mg BID d/t diarrhea. D/t issues with adherence, her capecitabine dose was changed to 500 mg po BID on a continuous schedule on 07/06/21. DR tucatinib further to 200 mg BID on 01/21/21.     Potential  Subsequent lines:  Consider return to TDM1 or Enhertu on progression predicated on LFTs.    B.  Systemic imaging.   -- CT C/A/P + Bone scan 01/21/22: stable disease w/o evidence of progression.     C.  Brain mets/neuroimaging   Surgery: 01/15/19: resection of the right frontal met. [+++] MBC.  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   07/14/21: MRI brain: SD.  11/17/21 MRI Brain: SD    D.  Molecular  Genomics:  STRATA: ERBB2, PIK3CA.  Not eligible for HARMONY.  Genetic: Referral submitted in 03/2020, patient was never reached.   Tumor Markers: stable    E. Radiation:   09/05/17: CK to 7 brain lesions. 09/13/18: CK (1#) right 9 mm lesion. 2/28 - 02/28/2019: CK 25 Gy in 5# to left frontal resection bed.     F. 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 Barbette Merino who thought not likely consequential but recommended CV cards eval.   > F/u with Dr. Harland German scheduled for 02/04/22  > Echo 12/30/20: EF 60-65%.   > NM perfusion SPECT on 04/22/21 stable    2. Hypokalemia:   > IV K+  last week for potassium of 2.9.   > Taking Klor-Con 20 MEQ daily at home, increased to 30 MEQ daily, > Message sent to CPP regarding optimization, will monitor with q3 week labs    3.  Comorbidities/supportive care   > Dysphagia/GERD/hemoglobin drop: Continue Nexium/famotidine.  TTG positive.  Given stable Hgb will hold on endoscopy for now.  F/u with Dr. Randye Lobo.  Gluten-free diet. Improved.   > Depression/Anxiety: Venlafaxine 225 mg. C/w clonazepam. F/u psychiatry/[facility]> Peripheral neuropathy: Venlafaxine 225 mg. C/w Lyrica dose 200 mg BID.  Paraneoplastic panels unremarkable. Dose reduced TDM1.  Did not tolerate methadone due to sedation/dizziness. Follows with palliative care. Can refer back to neurology to consider TENS versus scrambler therapy.   > Smoking cessation: Previously encouraged to stop smoking. 1 ppd currently.  Dr [name]  Previously prescribed Chantix.   > Insomnia: Continue trazodone 75mg . Will continue. -- thinks this works better than Remeron. I have messaged  Burtis Junes today about this  > Arthralgias: C-spine x-ray showed DJD.  Muscle relaxant by PCP. Referred to PT for back previously. Currently receiving neck PT.  If no improvement refer back to Dr. Glenice Laine. Consider PCI. Nondisplaced right hip fracture: Has morphine patches for mgmt.   > Cirrhosis. Abnormal LFTs. Follows with GI, Dr. Raford Pitcher. No more alcohol.  >Scapulothoracic bursitis: Consider steroid injections. Referred to Dr. Glenice Laine.    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 Applying foot and heel lotion. Stable    > Fatigue: will send TFTs at next lab appointment    3.  Follow-up   -- Continue Tucatinib + IV Trastuzumab+ Xeloda; Tucatinib DR to 200 mg daily  -- Labs w/each herceptin infusion to monitor potassium   -- Klor-con 30 MEQ daily  -- RTC 2 weeks for next infusion; provider follow up in 5 weeks    -----------------------------------------------------------------------------------------------------  Interval history:  Brooke [name] is a 66 y.o. F who presents today for follow up on Xeloda, Tucatinib, Herceptin.   -- Present with sisters, [name] and Misty Stanley  - recent scans look good , stable.  -- Tolerating medications well and taking as directed. Some mild HFS with palmar redness.   -- They have been getting scans every 4-6 months   -- She prefers [place]  -- No SOB or cough  -- She is having diarrhea 8-10 times per day, she has taken imodium and lomotil, but only improves with lomotil; lomotil reduces her frequency to every few days and can cause constipation  -- Not been doing electrolyte packets d/t taste  -- Does not frequently eat leafy greens, but does eat a diet of mixed vegetables and proteins  -- Still having insomnia, improves with trazodone and not remeron; overall not able to feel well rested  -- Lives by herself and is able to complete ADLs and when necessary does care-giving for elderly relative including groceries etc  -- No LE edema  -- Reports that she has been having worsening headaches  -- No new breast related complaints  -- Full 12 ROS reviewed and otherwise mild/none  -- note- patient seems withdrawn and hard to engage. At end of visit she tells me that she  almost fired me because I was late to see her. Odd since for a 9:30 appt , her check out time was 10:11 and that was for a scan review and meet new doctor visit....    Social history update: Lives alone. Has a boyfriend, [name], She has a son, [name]. Often accompanied by sisters [name] and Misty Stanley .  Review of Systems: A complete twelve systems review was obtained and is positive per the HPI but otherwise negative or unchanged. See MIMS #1170 where available.    ONC HISTORY  Metastatic HR + HER-2 overexpressing breast cancer to lung and brain.  Presenting stage 4 disease  In 2017  First-line and second line: Trastuzumab + AI -->Tamoxifen d/t arthralgias.  3rd line:  TDM1.  4th line: capecitabine (xeloda), tucatinib, herceptin    Consider [id number], Enhertu on progression.     Radiation: 09/05/17: CK to 7 brain lesions. 09/13/18: CK (1#) right 9 mm lesion. 2/28 - 02/28/2019: CK 25 Gy in 5# to left frontal resection bed.  Surgery: 01/15/19: resection of the right frontal met.  Genomic: STRATA: ERBB2, PIK3CA.  Not eligible for HARMONY.    2017 or earlier:  R breast wound and nipple inversion which patient describes herself as ignoring for some time    02/04/16:  Mammogram R breast showing 3.1 cm irregular spiculated dense mass in lateral Rt breast w nipple and skin retraction. 2 subcentimeter irregular masses in the RUOQ  (10', 11') (possible satellite lesions). Large Rt axillary lymph node 1.7. Lt breast clear. BIRAD 5.    02/08/16:  Core biopsy R axillary LN (breast bx deferred due to open wound) Gr 3, IDC with apocrine features. ER (-), PR (31-40), HER-2 (3+). Noted to have multiple skin lesions.    02/10/16:  Initial staging with CT CAP: Large, necrotic right breast mass with wide open tract to the skin, consistent with known right breast malignancy.  Numerous enlarged right axillary, subpectoral, mediastinal, bilateral hilar lymph nodes and innumerable bilateral pulmonary nodules.  Bone scan negative for osseous mets.    --Started trastuzumab initially with THP.  Taxol d/c after three cycles for G3 neuropathy.  Pertuzumab d/c for G3 diarrhea.  Not discontinued due to progression.    04/28/16:  Switched to anastrozole/trastuzumab  --patient had trouble tolerating AIs, switched to exemestane    07/13/16: genomic testing with STRATA showing ERBB2 copy # alteration, PIK3CA p.[id number]    08/01/17:  MRI showed multiple small brain metastases   --completed CK to 7 lesions on 09/05/17.   --CK to the right 9 mm lesion (1 fx on 09/13/18).    --continued on AI/trastuzumab    11/2018:  progression of her frontal lobe lesion.   --resection of the right frontal mass by Dr. Tresa Garter on 01/15/2019.  Pathology was consistent with metastatic breast cancer.  --2/28 - 02/28/2019: CK 25 Gy in 5# to left frontal resection bed     04/2019: switched to tamoxifen/trastuzmab due to intolerance of AIs (joint pain    09/2019:  Concern for possible slight extra-cranial progression in LLL lung mass, elected to continue on current tx    01/03/20:  Repeat CT with new lingular 1.9 cm nodular opacity; differential diagnosis includes new metastatic lesion and consolidative/infectious opacity. CT chest 1 month is recommended to help discriminate between these two etiologies.  Also slight increase R breast mass.    02/03/20:  Repeat CT again with lingular 1.9 cm nodule unchanged to slightly increased in size, left lower lobe 3.5 cm mass slightly increased in size. Discussed at [facility] due to concern that lingular mass might represent lung primary in current smoker. Recommended biopsy.      01/2020:  Changed to fulvestrant/trastuzumab due to progression    03/05/20:  FNA lung biopsy showing carcinoma c/w breast primary (GATA3+, TTF1-), ER neg/PR 10%/HER2 3+.  Switched to [employer]. C1D1 03/23/20.  03/29/21: Switched to capecitabine (xeloda), tucatinib, herceptin d/t abnormal LFT's.      Onc Family History:   - Father: prostate  - Mat grandfather: prostate  - Mat uncle: lung  - Pat aunt: breast   - Great niece: adrenal     Social History:   Social History     Social History Narrative    She lives in senior housing in [place] x2 months. he has 4 sisters, 2 nearby and involved in care. She has 1 son-[name], recently married 34 yo, who lives about an hour and a half away in [place]. He works full-time as a Sales executive. She is still a chronic smoker (1ppd). She is currently divorced.         Goes to [employer] in [place] and gets a lot of support. Her church friend [name] and her sisters goes with her to chemo appts.     Physical Examination:   Vital Signs: BP 118/56  - Pulse 72  - Temp 36.3 ??C (97.3 ??F) (Temporal)  - Resp 18  - Wt 52.6 kg (116 lb)  - LMP  (LMP Unknown)  - SpO2 97%  - BMI 21.22 kg/m??   General: Chronically ill appearing female in no acute distress.    HEENT: Well-healed (R) frontal scalp scar. EOMI. Sclerae anicteric.   Neck: Supple. No cervical or supraclavicular adenopathy.   Pulm: Lungs clear to auscultation bilat; breathing non-labored.   Cardiac: Regular rate and rhythm; no LE edema.    Abdominal: Non tender  Musculoskeletal: unremarkable  Breasts/chest: Deferred: last exam:  (R) breast: Nipple autolyzed. Small ulcer laterally to redeveloping nipple closed. (L) breast exam: Port to the upper left chest.  Neurologic:  Alert and oriented. Grossly non-focal  Lymphatic: No cervical or supraclavicular adenopathy. No palpable right axillary adenopathy.  Skin: Slight Erythema of palms/feet     DATA REVIEW:    Pertinent labs/imaging/pathology reviewed in detail.    CT CAP (01/20/22)  --Since 08/10/2021, unchanged thoracic metastatic disease with dominant mass in the left lower lobe measuring 3.1 x 2.8 cm. No new findings  --No evidence of metastatic disease in the abdomen or pelvis.  --Unchanged left adrenal nodule measuring 1.3 cm, and previously characterized as an adrenal adenoma.  --Additional chronic and incidental findings as detailed above.    Bone scan (01/20/22)  --No new suspicious sites of uptake to suggest progressive osteoblastic metastatic disease        A total of > 40 minutes were spent face-to-face and non-face-to-face in the care of this patient, which includes all pre, intra, and post visit time on the date of service.  ______________________________________________________________________    I attest that I, Lilly Cove, personally documented this note while acting as scribe for Denman George, MD.      Lilly Cove, Scribe.  01/21/2022   ______________________________________________      Documentation assistance provided by Medical Scribe, Lilly Cove, who was present during the entirety of the visit. I reviewed the note below and validated all of the information provided to ensure accuracy and completeness.     Denman George, MD    ------------------------------------------------------------

## 2022-01-26 ENCOUNTER — Telehealth: Admit: 2022-01-26 | Discharge: 2022-01-27 | Payer: MEDICARE

## 2022-01-26 DIAGNOSIS — C50919 Malignant neoplasm of unspecified site of unspecified female breast: Principal | ICD-10-CM

## 2022-01-26 NOTE — Unmapped (Unsigned)
OUTPATIENT ONCOLOGY PALLIATIVE CARE    Principal Diagnosis: [name] is a 66 y.o. female with metastatic breast cancer,  diagnosed in 2017.  Disease sites include lung and brain.     Assessment/Plan:   1.Neuropathic pain in hands and feet-stable and continued mid upper back pain radiating to lower back appears to be arthritic-controlled with current regimen.  Hx of cervical thoracic paraspinal trigger point bilateral this past July 2022 with not as much relief as she has had in the past.     -Continue lyrica 200 mg bid dosing-relief with this.  -Continue hydrocodone 10/325 mg 1 tab 3 times a day as needed for pain. Rare use recently  -Continue Effexor 225 mg every day  -Continue diclofenac gel as needed    Opioid use in past:  -Had difficulty tolerating the methadone 2.5 mg dosing due to sedation.  Did not try the 1 mg methadone, so could consider this in the future if needed for the neuropathic discomfort.  Some reluctance in re-starting methadone.  -No relief with tramadol  -oxycodone-night terrors.  -Suboxone-hallucinations    2. Wgt loss/anorexia-stable. Cleta isn't concern with wgt.   -Seen by dietitian recently.    3. Mood-good. Was able to spend time with her son Brooke [name] (lives about 90 min away). Spends the w/es with [name]-boyfriend and planning on going back to church.   -Continue with effexor, trazadone and clonazepam.     4.  Goals of care-not addressed today.     At prior visits: goals which are to decrease pain and to maintain her independence. Lives alone and [name] lives close by.     Wants to ensure that her quality of life remains high and the wish to keep on going.  Shared concern that she does not want to be in pain.  NP provided reassurance that our team can offer her different treatment strategies to help her with her goal.     Advance care planning-see advance care planning note dated 01/31/2020.  -at prior visits: Patient shared that she has been dreaming about death. She does have her funeral arrangements completed. Shared that she has her wishes written down and her Sister [name] knows where they are regarding her funeral. She still contemplating whether to be cremated or buried and she is leaning toward cremation. patient currently focused on cancer directed therapy.  Prefers to stay in the moment and not discussed things.  We will continue to support and address if she has a change in clinical condition.        -Advance directives scanned in on 11/02/19        HCDM [facility]): Long - Sister - [phone]    HCDM, [facility] - Sister - [phone]    At prior visits,   # Controlled substances risk management.  We are not currently prescribing controlled medications for her.   - Patient has a signed pain medication agreement with Outpt Palliative care, completed on 11/01/18, as per standard care. This was signed again on 01/28/19   - [place] database was reviewed today and it was appropriate.   - Urine drug screen was not performed at this visit. Findings: not applicable.   - Patient has received information about safe storage and administration of medications.   - Patient has received a prescription for narcan and shared with [name] and pt.       F/u: 2 months video    ----------------------------------------  Referring Provider: From inpatient oncology team  Oncology Team: Breast  team-Dr. [name]  PCP: [name], MD      HPI: 66 year old woman with her to overexpressing metastatic breast cancer to her lung and brain.  Was found to have multiple small brain metastases and completed CyberKnife therapy on September 11. Describes ongoing pain in her pelvis, rates it as a 3 out of 10 today.  Has been improving over the last week or 2.  Feels like gabapentin has been helpful, and is also responded well to Tylenol and ibuprofen in the past.  She is taken oxycodone and it gave her night terrors does not want to take it again.  Has taken Dilaudid previously and did not have side effects from this.    Current cancer-directed therapy: capecitabine (Xeloda), tucatinib, and trastuzumab (Herceptin).     Diagnosis of COVID (07/18/21)      Interval hx 01/26/22 [facility] and [name]    -Doing well both physically and mentally  -Shares she is sleeping a bit more due to being bored  -Feels that appetite is okay. Boyfriend does the cooking.   -Has not been using the hydrocodone recently.  Comments that nerve pain is under control      Palliative Performance Scale: 70% - Ambulation: Reduced / unable to do normal work, some evidence of disease / Self-Care: Full / Intake: Normal or reduced / Level of Conscious: Full         Coping/Support Issues: Patient reports she is been able to attend church functions and is finding this very helpful. Has boyfriend [name] for the last 15 years.     At prior visits, patient did share that she continues to receive much help from her church friend, Brooke [name] and her husband.  They prayed together and she is found this supportive.  She states that her 2 sisters have been supportive, but she feels that they are becoming more less patient with her due to patient's irritability.     Overall coping well, no specific issues identified.  Finding support with her church community, her sister, [name], and prayer.         Social History: Pt lives in senior housing in [place] month, says it's ok and quiet but she is from [place] and liked it there more. She has 4 sisters, 2 nearby and involved in care. She has 1 son-Brooke [name], 35yo, who lives about an hour and a half away in [place]. She is divorced.  ??  Goes to [employer] in [place] and gets a lot of support. Her church friend Brooke [name] goes with her to chemo appts. Her sisters will go too.    Advance Care Planning: Advance directives scanned into system  HCPOA: See ACP note  Living Will: See ACP note  ACP note: Yes, advance directive scanned in on November 6    Objective     Opioid Risk Tool:      Opioid Risk Tool:   Female  Female    Family history of substance abuse      Alcohol   1  3    Illegal drugs  2  3    Rx drugs  4  4    Personal history of substance abuse      Alcohol  3  3    Illegal drugs  4  4    Rx drugs  5  5    Age between 16--45 years  1  1    History of preadolescent sexual abuse  3 0    Psychological  disease      ADD, OCD, bipolar, schizophrenia  2  2    Depression  1  1    Total: 7  (<3 low risk, 4-7 moderate risk, >8 high risk)      Oncology History Overview Note   Identifying Statement:  Caylei Sperry is a 66 y.o. female diagnosed with a right posterior frontal lobe metastasis (breast primary) status post resection 01/15/2019 and 5/5 fractions of SBRT (2500 Gy via CyberKnife) to the resection cavity.    Treatment History:  09/05/17: S/p SRS to 7 lesions  01/15/19: S/p resection, followed by SRS 2500 cGy   03/04/19: KPS 80; MRI with postsurgical changes versus residual disease; RTC in 2 weeks w/ MRI   04/15/19: KPS NA; MRI w/ SD; no study; RTC 6 weeks w/ MRI   06/10/19: KPS 80; MRI w/ SD; RTC 4 weeks  07/15/19: KPS 80; MRI w/ SD; RTC 8 weeks  08/19/19: KPS 80; start nortriptyline for HAs; RTC 4 weeks w/ MRI  09/16/19: KPS 80; con't nortriptyline for HAs; RTC 2 mos w/ MRI  11/11/19: KPS 80; MRI w/ SD though w/ small infarct; proceed w/ CVA workup; start 81mg  ASA; encouraged smoking cessation; con't nortriptyline; RTC to discuss results  11/25/19: KPS 80; discussed CVA workup results; MRA unremarkable; con't smoking cessation efforts; con't nortriptyline for HAs; con't 81mg  ASA, start Lipitor; f/u with PCP for COPD eval; RTC 2 mos w/ MRI     Malignant neoplasm of overlapping sites of right female breast (CMS-HCC)   2017 -  Presenting Symptoms    Large open wound RT breast which began as nipple inversion. Patient states that she ignored for a long time.  Physical exam of the area of concern in the RTbreast demonstrates a large open wound in the lateral right breast. Saw PCP 01/27/16 Rx Keflex.     02/04/2016 Interval Scan(s)    MMG/US: 3.1 (3.0) cm irregular spiculated dense mass in lateral Rt breast w nipple and skin retraction. 2 subcentimeter irregular masses in the RUOQ  (10', 11') (possible satellite lesions). Large Rt axillary lymph node 1.7. Lt breast clear. BIRAD 5.     02/08/2016 Biopsy    US guided Rt. Axillary LN biopsy:  Gr 3, IDC with apocrine features. ER (-), PR (31-40), HER-2 (3+). Noted to have multiple skin lesions. Poor access to breast mass due open 10-15 cm wound risk of pain and bleeding.     02/09/2016 Initial Diagnosis    Malignant neoplasm of overlapping sites of right female breast (RAF-HCC)     02/10/2016 Interval Scan(s)    CT CAP: Large, necrotic right breast mass with wide open tract to the skin, consistent with known right breast malignancy.  Numerous enlarged right axillary, subpectoral, mediastinal, bilateral hilar lymph nodes and innumerable bilateral pulmonary nodules     02/10/2016 Interval Scan(s)    NM Bone scan: No osseous metastatic disease.     04/28/2016 - 03/10/2020 Chemotherapy    OP TRASTUZUMAB (EVERY 21 DAYS)  Trastuzumab 8 mg/kg loading then 6 mg/kg every 21 days     07/13/2016 Genetics    STRATA  ??? ERBB2 copy number alteration  Estimated copy number: 40, confidence interval: 35.7 - 44.0, cellularity: 50%  Associated FDA-approved targeted therapies in breast cancer: trastuzumab, lapatinib, ado-trastuzumab emtansine, pertuzumab  ??? PIK3CA p.[id number]     01/03/2020 -  Cancer Staged    CT CAP 01/03/2020 which showed a new lingular opacity measuring 1.9 cm in the right lung. There  was also slight increase in the right breast mass from 1.2-> 1.5 cm.  Bone scan shows no osseous metastases. Overall I considered these findings indeterminate and ppted to repeat CT CAP in 1 month     02/03/2020 -  Cancer Staged    CT chest 02/03/20 which showed that the lingular lesion has persisted and increased in size.  No mediastinal adenopathy reported.  She continues to report a nonproductive cough and mild dyspnea.  She is a chronic smoker and currently smokes on average half a pack per day.  My major concern is that this could be a second primary lung cancer as her other sites of diseases stable on current systemic therapy       02/03/2020 Endocrine/Hormone Therapy    Stop Tamoxifen, Add Fasoldex, continue Q3week Hercpetin     03/23/2020 - 02/09/2021 Chemotherapy    OP BREAST ADO-TRASTUZUMAB EMTANSINE  ado-trastuzumab emtansine 3.6 mg/kg IV on day 1, every 21 days     04/12/2021 -  Chemotherapy    Tucatinib + Capecitabine  OP TRASTUZUMAB (EVERY 21 DAYS)  trastuzumab 8 mg/kg IV LOADING, then 6 mg/kg IV MAINT, every 21 days     Brain metastases (CMS-HCC)   08/07/2017 Initial Diagnosis    Brain metastases (CMS-HCC)    Summary of Radiosurgery   Rx:7 brain met: 09/05/2017: 2,000/2,000 cGy  Sec:R Frontal: 09/05/2017: 2,000 cGy  Sec:L Post Fr: 09/05/2017: 2,000 cGy  Sec:L Ant Fro: 09/05/2017: 2,000 cGy  Sec:R Sup Oc: 09/05/2017: 2,000 cGy  Sec:L Occipita: 09/05/2017: 2,000 cGy  Sec:R Inf Occi: 09/05/2017: 2,000 cGy  Sec:L Tempor: 09/05/2017: 2,000 cGy  Rx:Lt Med Oc: 09/13/2018: 2,000/2,000 cGy  Rx:Rt Frontal: : 2,500/2,500 cGy    01/15/2019: Craniotomy and resection of right posterior frontal lobe metastasis. Followed by [facility]    03/04/19: KPS 80; MRI with postsurgical changes versus residual disease; RTC in 2 weeks w/ MRI     01/03/2020 -  Cancer Staged    CT CAP 01/03/2020 which showed a new lingular opacity measuring 1.9 cm in the right lung. There was also slight increase in the right breast mass from 1.2-> 1.5 cm.  Bone scan shows no osseous metastases. Overall I considered these findings indeterminate and ppted to repeat CT CAP in 1 month     02/03/2020 -  Cancer Staged    CT chest 02/03/20 which showed that the lingular lesion has persisted and increased in size.  No mediastinal adenopathy reported.  She continues to report a nonproductive cough and mild dyspnea.  She is a chronic smoker and currently smokes on average half a pack per day.  My major concern is that this could be a second primary lung cancer as her other sites of diseases stable on current systemic therapy       02/03/2020 Endocrine/Hormone Therapy    Stop Tamoxifen, Add Fasoldex, continue Q3week Hercpetin     Metastatic breast cancer (CMS-HCC)   01/25/2019 Initial Diagnosis    Metastatic breast cancer (CMS-HCC)     03/23/2020 - 02/09/2021 Chemotherapy    OP BREAST ADO-TRASTUZUMAB EMTANSINE  ado-trastuzumab emtansine 3.6 mg/kg IV on day 1, every 21 days     04/12/2021 -  Chemotherapy    Tucatinib + Capecitabine  OP TRASTUZUMAB (EVERY 21 DAYS)  trastuzumab 8 mg/kg IV LOADING, then 6 mg/kg IV MAINT, every 21 days         Patient Active Problem List   Diagnosis   ??? Malignant neoplasm of overlapping sites of right female breast (  CMS-HCC)   ??? Peripheral neuropathy   ??? Insomnia   ??? Brain metastases (CMS-HCC)   ??? Nausea   ??? Closed fracture of one rib with routine healing   ??? Diarrhea of presumed infectious origin   ??? Failure to thrive in adult   ??? Tobacco use   ??? Chronic diarrhea   ??? Depression   ??? Hyponatremia   ??? Back pain   ??? Closed fracture of multiple pubic rami, right, initial encounter (CMS-HCC)   ??? Macrocytosis   ??? Pelvic fracture (CMS-HCC)   ??? Metastatic breast cancer (CMS-HCC)   ??? Migraine without aura and without status migrainosus, not intractable   ??? Essential hypertension   ??? CVA (cerebral vascular accident) (CMS-HCC)   ??? DDD (degenerative disc disease), cervical   ??? Wheezing   ??? Dyslipidemia   ??? Angina pectoris (CMS-HCC)   ??? Papillary fibroelastoma of heart   ??? Antineoplastic chemotherapy induced anemia   ??? Celiac disease   ??? Iron deficiency anemia due to chronic blood loss   ??? Dry eye syndrome, bilateral   ??? Meibomian gland dysfunction (MGD) of both eyes   ??? Glaucoma suspect of both eyes   ??? Incipient cataract of both eyes   ??? 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 Fatty liver   ??? Hypokalemia   ??? Hypothyroidism, unspecified type       Past Medical History:   Diagnosis Date   ??? Abnormal ECG unsure   ??? Alcoholism (CMS-HCC)    ??? Allergic rhinitis    ??? Anemia    ??? Anxiety     insomnia   ??? Arthritis not sure   ??? Brain concussion    ??? Breast cancer (CMS-HCC)     chemo for now with mets   ??? COPD (chronic obstructive pulmonary disease) (CMS-HCC)    ??? Depression    ??? Dysphagia    ??? Fatty liver 07/08/2021   ??? Genitourinary disease    ??? GERD (gastroesophageal reflux disease)    ??? Headache    ??? HTN (hypertension)    ??? Hyperlipidemia    ??? Hypothyroidism, unspecified type 01/21/2022   ??? Insomnia    ??? 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 Stroke (CMS-HCC) Nov. 2020    Dr. Theodoro Kalata   ??? Tobacco dependence        Past Surgical History:   Procedure Laterality Date   ??? CESAREAN SECTION     ??? CHOLECYSTECTOMY     ??? FINGER SURGERY Right     trigger finger   ??? GANGLION CYST EXCISION Left     hand   ??? HYSTERECTOMY     ??? LYMPH NODE BIOPSY Right 02/08/2016    Axillary   ??? PR BRNSCHSC TNDSC EBUS DX/TX INTERVENTION PERPH LES Left 03/05/2020    Procedure: Bronch, Rigid Or Flexible, Including Fluoro Guidance, When Performed; With Transendoscopic Ebus During Bronchoscopic Diagnostic Or Therapeutic Intervention(S) For Peripheral Lesion(S);  Surgeon: Jerelyn Charles, MD;  Location: MAIN OR [facility];  Service: Pulmonary   ??? PR BRONCHOSCOPY,COMPUTER ASSIST/IMAGE-GUIDED NAVIGATION Left 03/05/2020    Procedure: BRONCHOSCOPY, RIGID OR FLEXIBLE, INCLUDE FLUORO WHEN PERFORMED; W/COMPUTER-ASSIST, IMAGE-GUIDED NAVIGATION;  Surgeon: Jerelyn Charles, MD;  Location: MAIN OR [facility];  Service: Pulmonary   ??? PR BRONCHOSCOPY,DIAGNOSTIC W LAVAGE Left 03/05/2020    Procedure: Bronchoscopy, Rigid Or Flexible, Include Fluoroscopic Guidance When Performed; W/Bronchial Alveolar Lavage;  Surgeon: Jerelyn Charles, MD;  Location: MAIN OR  [facility];  Service: Pulmonary   ??? PR BRONCHOSCOPY,TRANSBRON ASPIR BX Left 03/05/2020    Procedure: Bronchoscopy, Rigid/Flex, Incl Fluoro; W/Transbronch Ndl Aspirat Bx, Trachea, Main Stem &/Or Lobar Bronchus;  Surgeon: Jerelyn Charles, MD;  Location: MAIN OR [facility];  Service: Pulmonary   ??? PR BRONCHOSCOPY,TRANSBRONCH BIOPSY Left 03/05/2020    Procedure: Bronchoscopy, Rigid/Flexible, Include Fluoro Guidance When Performed; W/Transbronchial Lung Bx, Single Lobe;  Surgeon: Jerelyn Charles, MD;  Location: MAIN OR [facility];  Service: Pulmonary   ??? PR CATH PLACE/CORON ANGIO, IMG SUPER/INTERP,W LEFT HEART VENTRICULOGRAPHY N/A 05/07/2020    Procedure: Left Heart Catheterization;  Surgeon: Lesle Reek, MD;  Location: [facility] CATH;  Service: Cardiology   ??? PR COLONOSCOPY W/BIOPSY SINGLE/MULTIPLE N/A 10/21/2016    Procedure: COLONOSCOPY, FLEXIBLE, PROXIMAL TO SPLENIC FLEXURE; WITH BIOPSY, SINGLE OR MULTIPLE;  Surgeon: Monte Fantasia, MD;  Location: GI PROCEDURES [facility];  Service: Gastroenterology   ??? PR EXCIS SUPRATENT BRAIN TUMOR Right 01/15/2019    Procedure: CRANIECTOMY; EXC BRAIN TUMOR-SUPRATENTORIAL;  Surgeon: Edison Simon, MD;  Location: MAIN OR [facility];  Service: Neurosurgery   ??? PR INCISE FINGER TENDON SHEATH Left 06/17/2019    Procedure: R-20 TENDON SHEATH INCISION (EG, FOR TRIGGER FINGER);  Surgeon: Daisy Lazar, MD;  Location: ASC OR [facility];  Service: Orthopedics   ??? PR MICROSURG TECHNIQUES,REQ OPER MICROSCOPE Right 01/15/2019    Procedure: MICROSURGICAL TECHNIQUES, REQUIRING USE OF OPERATING MICROSCOPE (LIST SEPARATELY IN ADDITION TO CODE FOR PRIMARY PROCEDURE);  Surgeon: Edison Simon, MD;  Location: MAIN OR [facility];  Service: Neurosurgery   ??? PR STEREOTACTIC COMP ASSIST PROC,CRANIAL,INTRADURAL Right 01/15/2019    Procedure: STEREOTACTIC COMPUTER-ASSISTED (NAVIGATIONAL) PROCEDURE; CRANIAL, INTRADURAL;  Surgeon: Edison Simon, MD;  Location: MAIN OR [facility];  Service: Neurosurgery   ??? PR UPPER GI ENDOSCOPY,BIOPSY N/A 10/21/2016    Procedure: UGI ENDOSCOPY; WITH BIOPSY, SINGLE OR MULTIPLE;  Surgeon: Monte Fantasia, MD;  Location: GI PROCEDURES [facility];  Service: Gastroenterology   ??? PR UPPER GI ENDOSCOPY,BIOPSY N/A 04/02/2018    Procedure: UGI ENDOSCOPY; WITH BIOPSY, SINGLE OR MULTIPLE;  Surgeon: Wendall Papa, MD;  [facility]: [facility];  Service: Gastroenterology   ??? SKIN BIOPSY         Current Outpatient Medications   Medication Sig Dispense Refill   ??? albuterol HFA 90 mcg/actuation inhaler Inhale 2 puffs every six (6) hours as needed for wheezing.     ??? aluminum-magnesium hydroxide-simethicone 200-200-20 mg/5 mL Susp 80 mL, diphenhydrAMINE 12.5 mg/5 mL Liqd 200 mg, nystatin 100,000 unit/mL Susp 8,000,000 Units, distilled water Liqd 80 mL, lidocaine 2% viscous 2 % Soln 80 mL Take 5 mL by mouth every six (6) hours as needed. Swish, gargle, spit or swallow if throat issues 80 mL 2   ??? capecitabine (XELODA) 500 MG tablet Take 1 tablet (500 mg total) by mouth two (2) times a day, continuously. 60 tablet 5   ??? clobetasoL (TEMOVATE) 0.05 % ointment Apply twice a day to rash on palm until smooth/clear. 30 g 1   ??? clonazePAM (KLONOPIN) 0.5 MG tablet Take 0.5 mg daily as needed for anxiety.  Do not exceed 0.5 mg/day. 30 tablet 0   ??? diclofenac sodium (VOLTAREN) 1 % gel Apply 2 g topically four (4) times a day as needed for arthritis or pain. 150 g 2   ??? diphenoxylate-atropine (LOMOTIL) 2.5-0.025 mg per tablet Take 1 tablet by mouth 4 (four) times a day as needed for diarrhea. 30 tablet 1   ???  dorzolamide (TRUSOPT) 2 % ophthalmic solution Administer 1 drop into the left eye Three (3) times a day. 10 mL 12   ??? esomeprazole (NEXIUM) 40 MG capsule Take 1 capsule (40 mg total) by mouth in the morning. 90 capsule 3   ??? FARXIGA 5 mg Tab tablet Take 5 mg by mouth daily.     ??? HYDROcodone-acetaminophen (NORCO 10-325) 10-325 mg per tablet Take 1 tablet by mouth every twelve (12) hours as needed for pain. 60 tablet 0   ??? lidocaine (LIDODERM) 5 % patch Place 1 patch on the skin daily. Apply to affected area for 12 hours only each day (then remove patch) 30 patch 3   ??? lisinopriL-hydrochlorothiazide (PRINZIDE,ZESTORETIC) 20-25 mg per tablet Take 1 tablet by mouth daily. 90 tablet 3   ??? MAGNESIUM ORAL Take by mouth daily. OTC     ??? mirtazapine (REMERON) 7.5 MG tablet Take 1 tablet (7.5 mg total) by mouth nightly. 30 tablet 1   ??? naloxone (NARCAN) 4 mg nasal spray One spray in either nostril once for known/suspected opioid overdose. May repeat every 2-3 minutes in alternating nostril til EMS arrives (Patient not taking: Reported on 11/11/2021) 2 each 0   ??? ondansetron (ZOFRAN) 4 MG tablet Take 1 tablet (4 mg total) by mouth every six (6) hours as needed for nausea. 30 tablet 1   ??? potassium chloride (KLOR-CON) 20 mEq packet Take 1 packet (20 mEq total) by mouth Two (2) times a day. (Patient not taking: Reported on 01/21/2022) 60 packet 0   ??? pregabalin (LYRICA) 200 MG capsule Take 1 capsule (200 mg total) by mouth Two (2) times a day. 60 capsule 2   ??? prochlorperazine (COMPAZINE) 10 MG tablet Take 1 tablet (10 mg total) by mouth every six (6) hours as needed for nausea. 30 tablet 6   ??? senna (SENOKOT) 8.6 mg tablet Take 3 tablets by mouth in the morning. 30 tablet 2   ??? thiamine (VITAMIN B-1) 50 MG tablet Take 1 tablet (50 mg total) by mouth in the morning. 90 tablet 1   ??? traZODone (DESYREL) 50 MG tablet Take 1.5 tablets (75 mg total) by mouth nightly. 45 tablet 1   ??? tucatinib (TUKYSA) 150 mg tablet Take 1 tablet (150 mg total) by mouth two (2) times a day. 60 tablet 1   ??? tucatinib (TUKYSA) 50 mg tablet Take 2 tablets (100 mg total) by mouth two (2) times a day . 120 tablet 3   ??? venlafaxine (EFFEXOR-XR) 150 MG 24 hr capsule Take 2 capsules (300 mg total) by mouth daily. 60 capsule 5     No current facility-administered medications for this visit.       Allergies:   Allergies   Allergen Reactions   ??? Adhesive Rash   ??? Decadron [Dexamethasone] Anxiety     Mania as well   ??? Suboxone [Buprenorphine-Naloxone] Hallucinations   ??? Tetracycline      Other reaction(s): Other (See Comments) ??? Oxycodone      Night terrors   ??? Skin Protectants, Misc. Rash   ??? Tegaderm Adhesive-No Drug-Allergy Check Rash       Family History:  Cancer-related family history includes Cancer in her father, maternal grandfather, maternal uncle, maternal uncle, and paternal aunt.  She indicated that the status of her mother is unknown. She indicated that the status of her father is unknown. She indicated that the status of her brother is unknown. She indicated that the status of her maternal grandmother  is unknown. She indicated that the status of her maternal grandfather is unknown. She indicated that the status of her paternal grandmother is unknown. She indicated that the status of her paternal grandfather is unknown. She indicated that the status of her maternal aunt is unknown. She indicated that the status of her paternal uncle is unknown. She indicated that the status of her neg hx is unknown. She indicated that the status of her other is unknown.           Lab Results   Component Value Date    CREATININE 0.76 01/14/2022     Lab Results   Component Value Date    ALKPHOS 184 (H) 01/14/2022    BILITOT 0.6 01/14/2022    BILIDIR 0.30 03/29/2021    PROT 7.4 01/14/2022    ALBUMIN 4.1 01/14/2022    ALT 16 01/14/2022    AST 37 (H) 01/14/2022                  Burtis Junes, FNP-BC, [facility]  Outpatient Oncology Palliative Care Service  [facility]  [address] [place], [place] [address]  [phone]                 The patient reports they are currently: at home. I spent 25 minutes on the real-time audio and video  with the patient on the date of service. I spent an additional 10 minutes on pre- and post-visit activities on the date of service.     The patient was physically located in [place] or a state in which I am permitted to provide care. The patient and/or parent/guardian understood that s/he may incur co-pays and cost sharing, and agreed to the telemedicine visit. The visit was reasonable and appropriate under the circumstances given the patient's presentation at the time.    The patient and/or parent/guardian has been advised of the potential risks and limitations of this mode of treatment (including, but not limited to, the absence of in-person examination) and has agreed to be treated using telemedicine. The patient's/patient's family's questions regarding telemedicine have been answered.     If the visit was completed in an ambulatory setting, the patient and/or parent/guardian has also been advised to contact their provider???s office for worsening conditions, and seek emergency medical treatment and/or call 911 if the patient deems either necessary. to provide care. The patient and/or parent/guardian understood that s/he may incur co-pays and cost sharing, and agreed to the telemedicine visit. The visit was reasonable and appropriate under the circumstances given the patient's presentation at the time.    The patient and/or parent/guardian has been advised of the potential risks and limitations of this mode of treatment (including, but not limited to, the absence of in-person examination) and has agreed to be treated using telemedicine. The patient's/patient's family's questions regarding telemedicine have been answered.     If the visit was completed in an ambulatory setting, the patient and/or parent/guardian has also been advised to contact their provider???s office for worsening conditions, and seek emergency medical treatment and/or call 911 if the patient deems either necessary.

## 2022-02-03 ENCOUNTER — Telehealth
Admit: 2022-02-03 | Discharge: 2022-02-04 | Payer: MEDICARE | Attending: Student in an Organized Health Care Education/Training Program | Primary: Student in an Organized Health Care Education/Training Program

## 2022-02-03 DIAGNOSIS — F419 Anxiety disorder, unspecified: Principal | ICD-10-CM

## 2022-02-03 DIAGNOSIS — F32A Depression, unspecified depression type: Principal | ICD-10-CM

## 2022-02-03 MED ORDER — CLONAZEPAM 0.5 MG TABLET
ORAL_TABLET | 0 refills | 0 days | Status: CP
Start: 2022-02-03 — End: ?

## 2022-02-03 NOTE — Unmapped (Signed)
Comprehensive Cancer Support Program (CCSP) - Psychiatry Outpatient Clinic     After Visit Summary    It was a pleasure to see you today in the [facility]???s Comprehensive Cancer Support Program (CCSP). The CCSP is a multidisciplinary program dedicated to helping patients, caregivers, and families with cancer treatment, recovery and survivorship.      To learn more about the CCSP  CCSP Website:  [email or web address]    Call or stop by the [facility]  Location: Ground floor, [facility]  Phone: [phone]    To schedule, cancel, or change your appointment  Please message your provider via MyChart. You can also call the CCSP coordinator, Myrene Galas, at [phone].     For after hours urgent issues, you may call [phone] or call the I need to talk line at [phone] [phone] anytime 24/7.    For prescription refills  Please allow at least 24 hours (during business hours, M-F). We are generally unable to accommodate same-day requests for refills.     If you are taking any controlled substances (such as anxiety or sleep medications), you must use them as the directions say to use them. We generally do not provide early refills over the phone without clear reason, and it would be inappropriate to obtain the medications from other doctors. We routinely use the [place] controlled substance database to monitor prescription drug use.

## 2022-02-03 NOTE — Unmapped (Signed)
[name]  Comprehensive Cancer Support Program/Psychiatry   Established Patient E&M Service - Outpatient     Encounter Description: in person    Name: Brooke [name]  Date: 02/03/2022  MRN: [id number]  DOB: 1956/10/15    Assessment:  Brooke [name] presents for follow-up evaluation for depression and anxiety.    In the interim since last appointment, she has had another MRI brain, CT and bone scan showing no evidence of progression, and she has had potassium increased and tucatinib decreased (both related to chronic diarrhea). She continues on Xeloda and Herceptin as well (fourth line).     She repots stable mood and appetite but some increased stress due to taking care of sister after a bad infection, and this has lead to every day use of clonazepam. She asks about what medications could be affecting her memory, and we discuss potential contributions of clonazepam, opioids, and daily cannabis. She is still on a very low dose of clonazepam, and we will plan to have another visit in one month to assess the pattern of its use, consider alternate therapies, and reassess cognition/memory. We will not make medication changes today.     Identifying Information:  Brooke [name] is a 66 y.o. 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 Last treatment in 2020 (radiation to right frontal brain met post-op bed). She continues on capecitabine (Xeloda, pyrimidine analog) 500mg  BID, tucatinib (anti HER-2)150mg  BID, trastuzumab (Herceptin, anti-HER2) q3 weeks.    Risk Assessment:  An assessment of suicide and violence risk factors was performed as part of this evaluation and is not significantly changed from the last visit.   While future psychiatric events cannot be accurately predicted, the patient does not currently require acute inpatient psychiatric care and does not currently meet [facility] involuntary commitment criteria.      Plan:  Problem 1: Major depressive disorder, recurrent episode   Status of problem: chronic and stable  Interventions:   - Continue Effexor XR to 300mg  PO daily to target mood, anxiety, and hot flashes.    - Previously established with CCSP psychotherapist Brooke [name], not currently in therapy    Problem 2: Unspecified anxiety disorder  Status of problem: chronic and stable  Interventions:   - Continue Klonopin 0.5mg  qday PRN anxiety, last filled Nov 22. She has historically [name] this medication very sparingly (reportedly about 2x/week) but recently using nightly.   - Discussed risk/benefit again with her 02/03/21, in particular focusing on potential of benzodiazepines to cause or worsen cognitive deficits. Previously strongly discouraged her from taking klonopin unless absolutely necessary for intense anxiety    Problem 3: Insomnia  Status of problem: chronic and stable  Interventions:   - Continue Trazodone 75 mg nightly prn insomnia    Problem 4: Potential Sleep Apnea  Status of problem: Stable  Interventions:  - Ambulatory referral to [name] was placed in 10/2020 but she declined    Problem 5: Mild Neurocognitive Disorder  Status of problem: stable. MOCA 20/30 on 02/03/21  Interventions:  - Continue to monitor, patient noticing memory deficits like forgetting date, forgetting exactly where she is going. Denies leaving stove on or getting lost.  - Recommended pill organizer, alarms on phone and schedule/spreadsheet where she can mark doses once she has taken them    Follow-up: 3/9 at 3:30,  video visit      Psychotherapy provided:  No billable psychotherapy service provided.    Patient has been given this writer's contact information as well as the [facility] urgent line number. The patient has been instructed to call 911 for emergencies.    Patient was seen and plan of care was discussed with the Attending MD,Dr. Lawernce Keas, who agrees with the above statement and plan.      Subjective:    Chief complaint:  Follow-up psychiatric evaluation for anxiety and depression    Interval History:     Medical updates:  No big updates. Losing weight but that has been on purpose. Started walking around her apartment, goal was 120 (now 116). Having trouble with some food textures, like bread. She eats bacon and eggs and cheerios for breakfast, fresh veggies, a starch, and meat for dinner. Doesn't eat lunch. Also drinks high-protein carnation drinks    Effexor: loves it. Doesn't want to change it, 2 pills (300mg  total) in the morning. Sets out medicine each night and takes it with milk. Enables her to sleep without worrying about things, helps with night sweats. Staying even-keeled, not getting angry or frustrated with anyone    Going to sister's house daily for two weeks, 2 weeks ago sister got sepsis and was very sick. Was getting PT at home and Brooke [name] was helping around the house. Other sister helping her too after work. They're afraid she would fall. Brooke [name] to have a life alert    Not had much time to relax lately because she is caring for sister. She is tired a lot of the time, brain gets tired as well. She has memory issues and she knows it. Sticks to her same routine. Goes out shopping. Recently went out and realized she passed the store she wanted to go to.    Trazodone 75mg  nightly helps get her to sleep but she is still not very rested. Gets up between 3-5am, makes her coffee, goes back to bed sometimes for a few more hours.     Clonazepam uses once a day at maximum. Previously about twice a week, now about every night. Feels more calm when uses it, takes before scans/appts.     Mirtazapine: thought was sleeping pill but stopped it    No SI, says she couldn't do that to her son and God doesn't want her to do that. Depression for her is not eating enough or eating too much, sadness and crying in bed, racing thoughts. Right now not having these symptoms. [name] to see [facility]- interested in talking to her again.     Alcohol: quit but very rarely glass of wine with dinner. Had alcohol on Christmas or New Years  Marijuana: smokes one bowl every night, relaxing and helps with pain. One bowl. Smoked since 66 years old  Tobacco .75 pack.day, prev down to half pack daily but has been very hard to quit      Objective:    Mental Status Exam:  Appearance:    Appears stated age, Clean/Neat and wearing make-up and clean matching outfit   Motor:   No abnormal movements   Speech/Language:    Normal rate, volume, tone, fluency and Language intact, well formed   Mood:   good   Affect:   Calm, Cooperative and Euthymic, can laugh   Thought process and Associations:   Mostly logical, linear, clear, coherent, goal directed. At times gives answer only tangentially related to question, sometimes concrete  Abnormal/psychotic thought content:     Denied SI, denied HI. No evidence of paranoia or delusions.   Perceptual disturbances:     No AVH     Memory/recall  Historically poor   Attention  Able to concentrate and attend to conversation but not formally tested   Orientation  Did not formally assess but seems grossly oriented   Other:   Insight and judgment fair.  Fair impulse control.       Maralyn Sago, MD

## 2022-02-04 ENCOUNTER — Ambulatory Visit: Admit: 2022-02-04 | Discharge: 2022-02-04 | Payer: MEDICARE

## 2022-02-04 DIAGNOSIS — R110 Nausea: Principal | ICD-10-CM

## 2022-02-04 DIAGNOSIS — K900 Celiac disease: Principal | ICD-10-CM

## 2022-02-04 DIAGNOSIS — C50811 Malignant neoplasm of overlapping sites of right female breast: Principal | ICD-10-CM

## 2022-02-04 DIAGNOSIS — C50919 Malignant neoplasm of unspecified site of unspecified female breast: Principal | ICD-10-CM

## 2022-02-04 DIAGNOSIS — R197 Diarrhea, unspecified: Principal | ICD-10-CM

## 2022-02-04 DIAGNOSIS — E039 Hypothyroidism, unspecified: Principal | ICD-10-CM

## 2022-02-04 DIAGNOSIS — E876 Hypokalemia: Principal | ICD-10-CM

## 2022-02-04 DIAGNOSIS — Z171 Estrogen receptor negative status [ER-]: Principal | ICD-10-CM

## 2022-02-04 LAB — CBC W/ AUTO DIFF
BASOPHILS ABSOLUTE COUNT: 0 10*9/L (ref 0.0–0.1)
BASOPHILS RELATIVE PERCENT: 0.5 %
EOSINOPHILS ABSOLUTE COUNT: 0.1 10*9/L (ref 0.0–0.5)
EOSINOPHILS RELATIVE PERCENT: 1.2 %
HEMATOCRIT: 41.7 % (ref 34.0–44.0)
HEMOGLOBIN: 14.3 g/dL (ref 11.3–14.9)
LYMPHOCYTES ABSOLUTE COUNT: 2 10*9/L (ref 1.1–3.6)
LYMPHOCYTES RELATIVE PERCENT: 40.6 %
MEAN CORPUSCULAR HEMOGLOBIN CONC: 34.4 g/dL (ref 32.0–36.0)
MEAN CORPUSCULAR HEMOGLOBIN: 40.1 pg — ABNORMAL HIGH (ref 25.9–32.4)
MEAN CORPUSCULAR VOLUME: 116.7 fL — ABNORMAL HIGH (ref 77.6–95.7)
MEAN PLATELET VOLUME: 8.1 fL (ref 6.8–10.7)
MONOCYTES ABSOLUTE COUNT: 0.5 10*9/L (ref 0.3–0.8)
MONOCYTES RELATIVE PERCENT: 9.2 %
NEUTROPHILS ABSOLUTE COUNT: 2.4 10*9/L (ref 1.8–7.8)
NEUTROPHILS RELATIVE PERCENT: 48.5 %
NUCLEATED RED BLOOD CELLS: 0 /100{WBCs} (ref <=4)
PLATELET COUNT: 154 10*9/L (ref 150–450)
RED BLOOD CELL COUNT: 3.57 10*12/L — ABNORMAL LOW (ref 3.95–5.13)
RED CELL DISTRIBUTION WIDTH: 14.8 % (ref 12.2–15.2)
WBC ADJUSTED: 5 10*9/L (ref 3.6–11.2)

## 2022-02-04 LAB — COMPREHENSIVE METABOLIC PANEL
ALBUMIN: 3.8 g/dL (ref 3.4–5.0)
ALKALINE PHOSPHATASE: 184 U/L — ABNORMAL HIGH (ref 46–116)
ALT (SGPT): 13 U/L (ref 10–49)
ANION GAP: 3 mmol/L — ABNORMAL LOW (ref 5–14)
AST (SGOT): 26 U/L (ref <=34)
BILIRUBIN TOTAL: 0.7 mg/dL (ref 0.3–1.2)
BLOOD UREA NITROGEN: 5 mg/dL — ABNORMAL LOW (ref 9–23)
BUN / CREAT RATIO: 7
CALCIUM: 9 mg/dL (ref 8.7–10.4)
CHLORIDE: 116 mmol/L — ABNORMAL HIGH (ref 98–107)
CO2: 24.8 mmol/L (ref 20.0–31.0)
CREATININE: 0.71 mg/dL
EGFR CKD-EPI (2021) FEMALE: >90 mL/min/{1.73_m2} (ref >=60)
GLUCOSE RANDOM: 104 mg/dL (ref 70–179)
POTASSIUM: 2.7 mmol/L — ABNORMAL LOW (ref 3.4–4.8)
PROTEIN TOTAL: 6.9 g/dL (ref 5.7–8.2)
SODIUM: 144 mmol/L (ref 135–145)

## 2022-02-04 LAB — TSH: THYROID STIMULATING HORMONE: 5.893 u[IU]/mL — ABNORMAL HIGH (ref 0.550–4.780)

## 2022-02-04 MED ORDER — DIPHENOXYLATE-ATROPINE 2.5 MG-0.025 MG TABLET
ORAL_TABLET | Freq: Four times a day (QID) | ORAL | 1 refills | 8.00000 days | PRN
Start: 2022-02-04 — End: ?

## 2022-02-04 MED ADMIN — trastuzumab (HERCEPTIN) 340 mg in sodium chloride (NS) 0.9 % 250 mL IVPB: 6 mg/kg | INTRAVENOUS | @ 15:00:00 | Stop: 2022-02-04

## 2022-02-04 MED ADMIN — sodium chloride (NS) 0.9 % infusion: 100 mL/h | INTRAVENOUS | @ 15:00:00 | Stop: 2022-02-04

## 2022-02-04 MED ADMIN — heparin, porcine (PF) 100 unit/mL injection 500 Units: 500 [IU] | INTRAVENOUS | @ 16:00:00 | Stop: 2022-02-04

## 2022-02-04 NOTE — Unmapped (Signed)
Patient arrived in the infusion clinic at 0900.  Weight and Vitals were obtained. Port was accessed, RN  flushed with blood return, dressing clean, dry, and intact.  Labs were drawn per treatment plan. No premedications per treatment plan. Patient was administered chemotherapy regiment as ordered without complications while in the clinic. [name]'s potassium 2.7 and per therapy plan PO and PIV potassium ot be administered. Pt declined potassium treatment while in clinic. Educated pt on managing potassium levels. Brooke Portela, NP aware of patient's decision. Patient given after visit summary then discharged home to self care with loved one.

## 2022-02-04 NOTE — Unmapped (Signed)
DIVISION OF CARDIOLOGY  [facility]        02/04/2022    PCP:  [name], MD  [address] [facility]  [place] [address]   Phone:  [phone]  Fax:  [phone]    Referring Physicians:  [name], Md  [address]  [facility]  [place],  [place] [address]          History of Present Illness:  Brooke [name] is a 66 y.o. female with a history of DM1, HTN, HLD and metastatic breast Cx (on Trastuzumab) who is initially presented for evaluation of MV mass. TTE performed for routine monitoring while receiving chemo showed a mobile echodensity mass on the anterior leaflet of the MV in 2018. This density has been documented on subsequent echocardiograms without change. More recently a TEE was performed to further assess this mass. On TEE there is a mass on the ventricular side of the anterior leaflet, most likely attached to the cord apparatus measuring 8x29mm. There is also mobile echodense mass seen on the atrial side of the valve measuring 8x5mm. Not associated with major valvular dysfunction (only mild MR).     Today Brooke [name] is present for a follow up. She is accompanied with her daughter. She reports being tired and CP. She believes this is due to a new potassium medication. Trying to increase potassium alternavely because they are big and nasty. Will try to increase potassium intake with diet or IV fluids.     After starting on farxiga she feels that she is breathing good now.     She does report being concerned with weight loss. Initially she was trying to lose weight, but now weight have been sporadically declining. Has been in eating properly She has been losing weight and she has been eating properly. Was at 170 and lost 50 pounds in a span of a few months. Have not tried to have protein shaked.    Cancer have been shrinking slightly, but not growing.  ??  Sister is recently diagnosed with afib.     HPI 05/28/2021:  Brooke [name] reports progressive shortness of breath starting in November 2020.  Shortness of breath occurs with walking or carrying of groceries and is relieved by rest.  It takes about 20 minutes for shortness of breath to completely relieved.  Patient also reports chest pain which occurs with exertion but at times at rest.  When having chest pain on exertion this is relieved by rest as well.  She reports occasional palpitations in association with shortness of breath. The patient denies any recent  orthopnea, PND, leg swelling, changes in weight , changes in appetite and dizzy spells.  ??  Denies any history of prior radiation therapy.    Last clinic visit in July 2021 she was doing well at the time with less SOB and no chest pain. Improvement of symptoms. Cath was neg for obstructive CAD.    SOB much better now.  Improved with SGLT2i,  Overall, doing well from a CV standpoint.        Problem List:  Patient Active Problem List   Diagnosis   ??? Malignant neoplasm of overlapping sites of right female breast (CMS-HCC)   ??? Peripheral neuropathy   ??? Insomnia   ??? Brain metastases (CMS-HCC)   ??? Nausea   ??? Closed fracture of one rib with routine healing   ??? Diarrhea of presumed infectious origin   ???  Failure to thrive in adult   ??? Tobacco use   ??? Chronic diarrhea   ??? Depression   ??? Hyponatremia   ??? Back pain   ??? Closed fracture of multiple pubic rami, right, initial encounter (CMS-HCC)   ??? Macrocytosis   ??? Pelvic fracture (CMS-HCC)   ??? Metastatic breast cancer (CMS-HCC)   ??? Migraine without aura and without status migrainosus, not intractable   ??? Essential hypertension   ??? CVA (cerebral vascular accident) (CMS-HCC)   ??? DDD (degenerative disc disease), cervical   ??? Wheezing   ??? Dyslipidemia   ??? Angina pectoris (CMS-HCC)   ??? Papillary fibroelastoma of heart   ??? Antineoplastic chemotherapy induced anemia   ??? Celiac disease   ??? Iron deficiency anemia due to chronic blood loss   ??? Dry eye syndrome, bilateral   ??? Meibomian gland dysfunction (MGD) of both eyes ??? Glaucoma suspect of both eyes   ??? Incipient cataract of both eyes   ??? 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 Fatty liver   ??? Hypokalemia   ??? Hypothyroidism, unspecified type       Current Medications:  Current Outpatient Medications   Medication Sig Dispense Refill   ??? albuterol HFA 90 mcg/actuation inhaler Inhale 2 puffs every six (6) hours as needed for wheezing.     ??? aluminum-magnesium hydroxide-simethicone 200-200-20 mg/5 mL Susp 80 mL, diphenhydrAMINE 12.5 mg/5 mL Liqd 200 mg, nystatin 100,000 unit/mL Susp 8,000,000 Units, distilled water Liqd 80 mL, lidocaine 2% viscous 2 % Soln 80 mL Take 5 mL by mouth every six (6) hours as needed. Swish, gargle, spit or swallow if throat issues 80 mL 2   ??? capecitabine (XELODA) 500 MG tablet Take 1 tablet (500 mg total) by mouth two (2) times a day, continuously. 60 tablet 5   ??? clobetasoL (TEMOVATE) 0.05 % ointment Apply twice a day to rash on palm until smooth/clear. 30 g 1   ??? clonazePAM (KLONOPIN) 0.5 MG tablet Take 0.5 mg daily as needed for anxiety.  Do not exceed 0.5 mg/day. 30 tablet 0   ??? diclofenac sodium (VOLTAREN) 1 % gel Apply 2 g topically four (4) times a day as needed for arthritis or pain. 150 g 2   ??? dorzolamide (TRUSOPT) 2 % ophthalmic solution Administer 1 drop into the left eye Three (3) times a day. 10 mL 12   ??? esomeprazole (NEXIUM) 40 MG capsule Take 1 capsule (40 mg total) by mouth in the morning. 90 capsule 3   ??? FARXIGA 5 mg Tab tablet Take 5 mg by mouth daily.     ??? HYDROcodone-acetaminophen (NORCO 10-325) 10-325 mg per tablet Take 1 tablet by mouth every twelve (12) hours as needed for pain. 60 tablet 0   ??? lisinopriL-hydrochlorothiazide (PRINZIDE,ZESTORETIC) 20-25 mg per tablet Take 1 tablet by mouth daily. 90 tablet 3   ??? ondansetron (ZOFRAN) 4 MG tablet Take 1 tablet (4 mg total) by mouth every six (6) hours as needed for nausea. 30 tablet 1   ??? potassium chloride (KLOR-CON) 20 mEq packet Take 1 packet (20 mEq total) by mouth Two (2) times a day. 60 packet 0   ??? pregabalin (LYRICA) 200 MG capsule Take 1 capsule (200 mg total) by mouth Two (2) times a day. 60 capsule 2   ??? prochlorperazine (COMPAZINE) 10 MG tablet Take 1 tablet (10 mg total) by mouth every six (6) hours as needed for nausea. 30 tablet 6   ??? senna (  SENOKOT) 8.6 mg tablet Take 3 tablets by mouth in the morning. 30 tablet 2   ??? traZODone (DESYREL) 50 MG tablet Take 1.5 tablets (75 mg total) by mouth nightly. 45 tablet 1   ??? tucatinib (TUKYSA) 150 mg tablet Take 1 tablet (150 mg total) by mouth two (2) times a day. 60 tablet 1   ??? tucatinib (TUKYSA) 50 mg tablet Take 2 tablets (100 mg total) by mouth two (2) times a day . 120 tablet 3   ??? venlafaxine (EFFEXOR-XR) 150 MG 24 hr capsule Take 2 capsules (300 mg total) by mouth daily. 60 capsule 5   ??? diphenoxylate-atropine (LOMOTIL) 2.5-0.025 mg per tablet Take 1 tablet by mouth four (4) times a day as needed for diarrhea. 30 tablet 1   ??? lidocaine (LIDODERM) 5 % patch Place 1 patch on the skin daily. Apply to affected area for 12 hours only each day (then remove patch) (Patient not taking: Reported on 02/04/2022) 30 patch 3   ??? MAGNESIUM ORAL Take by mouth daily. OTC (Patient not taking: Reported on 02/04/2022)     ??? naloxone (NARCAN) 4 mg nasal spray One spray in either nostril once for known/suspected opioid overdose. May repeat every 2-3 minutes in alternating nostril til EMS arrives (Patient not taking: Reported on 11/11/2021) 2 each 0   ??? thiamine (VITAMIN B-1) 50 MG tablet Take 1 tablet (50 mg total) by mouth in the morning. (Patient not taking: Reported on 02/04/2022) 90 tablet 1     No current facility-administered medications for this visit.       Past Medical History:  Past Medical History:   Diagnosis Date   ??? Abnormal ECG unsure   ??? Alcoholism (CMS-HCC)    ??? Allergic rhinitis    ??? Anemia    ??? Anxiety     insomnia   ??? Arthritis not sure   ??? Brain concussion    ??? Breast cancer (CMS-HCC)     chemo for now with mets ??? COPD (chronic obstructive pulmonary disease) (CMS-HCC)    ??? Depression    ??? Dysphagia    ??? Fatty liver 07/08/2021   ??? Genitourinary disease    ??? GERD (gastroesophageal reflux disease)    ??? Headache    ??? HTN (hypertension)    ??? Hyperlipidemia    ??? Hypothyroidism, unspecified type 01/21/2022   ??? Insomnia    ??? 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 Stroke (CMS-HCC) Nov. 2020    Dr. Theodoro Kalata   ??? Tobacco dependence        Past Surgical History:  Past Surgical History:   Procedure Laterality Date   ??? CESAREAN SECTION     ??? CHOLECYSTECTOMY     ??? FINGER SURGERY Right     trigger finger   ??? GANGLION CYST EXCISION Left     hand   ??? HYSTERECTOMY     ??? LYMPH NODE BIOPSY Right 02/08/2016    Axillary   ??? PR BRNSCHSC TNDSC EBUS DX/TX INTERVENTION PERPH LES Left 03/05/2020    Procedure: Bronch, Rigid Or Flexible, Including Fluoro Guidance, When Performed; With Transendoscopic Ebus During Bronchoscopic Diagnostic Or Therapeutic Intervention(S) For Peripheral Lesion(S);  Surgeon: Jerelyn Charles, MD;  Location: MAIN OR [facility];  Service: Pulmonary   ??? PR BRONCHOSCOPY,COMPUTER ASSIST/IMAGE-GUIDED NAVIGATION Left 03/05/2020    Procedure: BRONCHOSCOPY, RIGID OR FLEXIBLE, INCLUDE FLUORO WHEN PERFORMED; W/COMPUTER-ASSIST, IMAGE-GUIDED NAVIGATION;  Surgeon: Jerelyn Charles, MD;  Location: MAIN OR [place];  Service: Pulmonary   ??? PR BRONCHOSCOPY,DIAGNOSTIC W LAVAGE Left 03/05/2020    Procedure: Bronchoscopy, Rigid Or Flexible, Include Fluoroscopic Guidance When Performed; W/Bronchial Alveolar Lavage;  Surgeon: Jerelyn Charles, MD;  Location: MAIN OR [facility];  Service: Pulmonary   ??? PR BRONCHOSCOPY,TRANSBRON ASPIR BX Left 03/05/2020    Procedure: Bronchoscopy, Rigid/Flex, Incl Fluoro; W/Transbronch Ndl Aspirat Bx, Trachea, Main Stem &/Or Lobar Bronchus;  Surgeon: Jerelyn Charles, MD;  Location: MAIN OR [facility];  Service: Pulmonary   ??? PR BRONCHOSCOPY,TRANSBRONCH BIOPSY Left 03/05/2020    Procedure: Bronchoscopy, Rigid/Flexible, Include Fluoro Guidance When Performed; W/Transbronchial Lung Bx, Single Lobe;  Surgeon: Jerelyn Charles, MD;  Location: MAIN OR [facility];  Service: Pulmonary   ??? PR CATH PLACE/CORON ANGIO, IMG SUPER/INTERP,W LEFT HEART VENTRICULOGRAPHY N/A 05/07/2020    Procedure: Left Heart Catheterization;  Surgeon: Lesle Reek, MD;  Location: [facility] CATH;  Service: Cardiology   ??? PR COLONOSCOPY W/BIOPSY SINGLE/MULTIPLE N/A 10/21/2016    Procedure: COLONOSCOPY, FLEXIBLE, PROXIMAL TO SPLENIC FLEXURE; WITH BIOPSY, SINGLE OR MULTIPLE;  Surgeon: Monte Fantasia, MD;  Location: GI PROCEDURES [facility];  Service: Gastroenterology   ??? PR EXCIS SUPRATENT BRAIN TUMOR Right 01/15/2019    Procedure: CRANIECTOMY; EXC BRAIN TUMOR-SUPRATENTORIAL;  Surgeon: Edison Simon, MD;  Location: MAIN OR [facility];  Service: Neurosurgery   ??? PR INCISE FINGER TENDON SHEATH Left 06/17/2019    Procedure: R-20 TENDON SHEATH INCISION (EG, FOR TRIGGER FINGER);  Surgeon: Daisy Lazar, MD;  Location: ASC OR [facility];  Service: Orthopedics   ??? PR MICROSURG TECHNIQUES,REQ OPER MICROSCOPE Right 01/15/2019    Procedure: MICROSURGICAL TECHNIQUES, REQUIRING USE OF OPERATING MICROSCOPE (LIST SEPARATELY IN ADDITION TO CODE FOR PRIMARY PROCEDURE);  Surgeon: Edison Simon, MD;  Location: MAIN OR [facility];  Service: Neurosurgery   ??? PR STEREOTACTIC COMP ASSIST PROC,CRANIAL,INTRADURAL Right 01/15/2019    Procedure: STEREOTACTIC COMPUTER-ASSISTED (NAVIGATIONAL) PROCEDURE; CRANIAL, INTRADURAL;  Surgeon: Edison Simon, MD;  Location: MAIN OR [facility];  Service: Neurosurgery   ??? PR UPPER GI ENDOSCOPY,BIOPSY N/A 10/21/2016    Procedure: UGI ENDOSCOPY; WITH BIOPSY, SINGLE OR MULTIPLE;  Surgeon: Monte Fantasia, MD;  Location: GI PROCEDURES [facility];  Service: Gastroenterology   ??? PR UPPER GI ENDOSCOPY,BIOPSY N/A 04/02/2018    Procedure: UGI ENDOSCOPY; WITH BIOPSY, SINGLE OR MULTIPLE;  Surgeon: Wendall Papa, MD;  [facility];  Service: Gastroenterology   ??? SKIN BIOPSY         Allergies:  Allergies   Allergen Reactions   ??? Adhesive Rash   ??? Decadron [Dexamethasone] Anxiety     Mania as well   ??? Suboxone [Buprenorphine-Naloxone] Hallucinations   ??? Tetracycline      Other reaction(s): Other (See Comments)   ??? Oxycodone      Night terrors   ??? Skin Protectants, Misc. Rash   ??? Tegaderm Adhesive-No Drug-Allergy Check Rash       Review Of Systems:  Twelve system review of systems was completed and was non-contributory except as noted above, in the history of present illness.    Family History:  Her family history includes COPD in her mother; Cancer in her father, maternal grandfather, maternal uncle, maternal uncle, and paternal aunt; Diabetes in her sister, sister, and sister; Heart attack in her brother, father, and sister; Heart disease in her brother, maternal grandmother, and sister; Heart failure in her brother, father, and sister; Hypertension in her brother, mother, sister, sister, sister, and sister; No Known Problems in her maternal aunt, paternal  aunt, paternal grandfather, paternal grandmother, paternal uncle, and another family member.    Social History:  She  reports that she has been smoking cigarettes. She has a 40.00 pack-year smoking history. She has never used smokeless tobacco. She reports that she does not currently use alcohol after a past usage of about 10.0 standard drinks per week. She reports that she does not use drugs.    Physical Exam:     BP 116/60 (BP Site: L Arm, BP Position: Sitting, BP Cuff Size: Medium)  - Pulse 70  - Ht 157.5 cm (5' 2)  - Wt 51.9 kg (114 lb 6.4 oz)  - LMP  (LMP Unknown)  - SpO2 98%  - BMI 20.92 kg/m??   Wt Readings from Last 3 Encounters:   02/04/22 51.9 kg (114 lb 6.4 oz)   02/04/22 51.8 kg (114 lb 1.6 oz)   01/21/22 52.6 kg (116 lb)     General Appearance:  Alert, cooperative, no distress, appears stated age.   HEENT:  Unremarkable   Neck: Carotids 2+ bilaterally, no carotid bruits,  no JVD, no HJR.  Central venous pressure is normal.  No thyromegaly noted.   Lungs:   Clear to auscultation bilaterally, respirations unlabored.   Heart:  Regular rate and rhythm  normal S1 and S2   no murmurs/rubs/gallops  non-displaced PMI, no RV heave.   Abdomen:   Soft, non-tender, bowel sounds active,  no masses, no organomegaly.   Extremities: Extremities normal, no cyanosis or edema.   Pulses: PT's 2+ symmetric bilaterally.   Skin: No lower extremity rashes or ulcers.   Neurologic: Alert, interactive, and appropriate, grossly moving all 4 extremities.      Accessory Data:    ECG:      Echo:     TTE 12/30/20  Summary    1. The left ventricular systolic function is normal, LVEF is visually estimated at 60-65%.    2. There is grade II diastolic dysfunction (elevated filling pressure).    3. 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    4. There is mild mitral valve regurgitation.    5. The right ventricle is normal in size, with normal systolic function.    6. There is mild pulmonary hypertension, estimated pulmonary artery systolic pressure is 49 mmHg.    7. IVC size and inspiratory change suggest elevated right atrial pressure. (10-20 mmHg).  ??  TTE 02/20/20  Summary    1. 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    2. There is mild mitral valve regurgitation.    3. The left ventricular systolic function is normal, LVEF is visually estimated at > 55%.    4. The right ventricle is normal in size, with normal systolic function.    CATH:    Left heart catheterization 05/07/20  ?? No significant coronary artery disease.  ?? Normal left ventricular filling pressures (LVEDP = 9 mm Hg).      LABS:     No orders of the defined types were placed in this encounter.      Lab Results   Component Value Date    WBC 5.0 02/04/2022    HGB 14.3 02/04/2022    HCT 41.7 02/04/2022 PLT 154 02/04/2022       Lab Results   Component Value Date    NA 144 02/04/2022    K 2.7 (L) 02/04/2022    CL 116 (  H) 02/04/2022    CO2 24.8 02/04/2022    BUN 5 (L) 02/04/2022    CREATININE 0.71 02/04/2022    GLU 104 02/04/2022    CALCIUM 9.0 02/04/2022    MG 1.8 01/14/2022    PHOS 3.6 10/08/2020       Lab Results   Component Value Date    BILITOT 0.7 02/04/2022    BILIDIR 0.30 03/29/2021    PROT 6.9 02/04/2022    ALBUMIN 3.8 02/04/2022    ALT 13 02/04/2022    AST 26 02/04/2022    ALKPHOS 184 (H) 02/04/2022       Lab Results   Component Value Date    INR 0.91 04/06/2021    APTT 30.4 12/28/2018       Lab Results   Component Value Date    CHOL 194 07/06/2021    CHOL 187 11/17/2020    CHOL 224 (H) 11/15/2019     Lab Results   Component Value Date    HDL 85 (H) 07/06/2021    HDL 68 (H) 11/17/2020    HDL 65 (H) 11/15/2019     Lab Results   Component Value Date    LDL 91 07/06/2021    LDL 98 11/17/2020    LDL 109 (H) 11/15/2019     Lab Results   Component Value Date    VLDL 18 07/06/2021    VLDL 16.1 11/17/2020    VLDL 50 (H) 11/15/2019     Lab Results   Component Value Date    CHOLHDLRATIO 2.3 07/06/2021    CHOLHDLRATIO 2.8 11/17/2020    CHOLHDLRATIO 3.4 11/15/2019     Lab Results   Component Value Date    TRIG 90 07/06/2021    TRIG 103 11/17/2020    TRIG 250 (H) 11/15/2019       The 10-yr ASCVD Risk score Denman George DC Jr., et al., 2013) failed to calculate due to the following reason:  The 2013 10-yr ASCVD risk score is only valid if the patient does not have prior/existing clinical ASCVD (myocardial infarction, stroke, CABG, coronary angioplasty, angina or peripheral arterial disease, coronary atherosclerosis, ischemic heart disease, or cerebrovascular disease). The patient has prior/existing ASCVD.  Had Stroke  Has Angina      No results found for: A1C         Impression and Plan:  Brooke [name] is a 66 y.o. female with a history of DM1, HTN, HLD and metastatic breast Cx (on Trastuzumab) who is presenting for cardiology follow up of MV mass.    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 Minimal MR. Likely not etiology of dyspnea.   ??  2. Dyspnea on Exertion/ Chest pain: Concerning for angina pectoris and obstructive coronary artery disease.  Less likely related to heart failure given physical exam and history without orthopnea, PND, lower extremity swelling.  No CAD on cath.      Discussed the need to stop smoking.  Encouraged her to continue to work on tobacco cessation. Doing better.      Diastolic heart failure c/w EMPEROR-Preserved trial.   - Jardiance 10mg  daily     3. HTN, well controlled today 116/60. Continues to follow up with [name].  She will continue current regimen  ??  4.  HLD  Continue atorvastatin    Return in about 1 year (around 02/04/2023).      02/06/2022    Clyda Greener, MD, MHS  Associate Professor of Medicine  [facility]  [facility]  [place], [place]    I personally spent 23 minutes face-to-face and non-face-to-face in the care of this patient, which includes all pre, intra, and post visit time on the date of service.

## 2022-02-04 NOTE — Unmapped (Signed)
Infusion on 02/04/2022   Component Date Value Ref Range Status    Sodium 02/04/2022 144  135 - 145 mmol/L Final    Potassium 02/04/2022 2.7 (L)  3.4 - 4.8 mmol/L Final    Chloride 02/04/2022 116 (H)  98 - 107 mmol/L Final    CO2 02/04/2022 24.8  20.0 - 31.0 mmol/L Final    Anion Gap 02/04/2022 3 (L)  5 - 14 mmol/L Final    BUN 02/04/2022 5 (L)  9 - 23 mg/dL Final    Creatinine 32/44/0102 0.71  0.60 - 0.80 mg/dL Final    BUN/Creatinine Ratio 02/04/2022 7   Final    eGFR CKD-EPI (2021) Female 02/04/2022 >90  >=60 mL/min/1.74m2 Final    eGFR calculated with CKD-EPI 2021 equation in accordance with [employer] and [employer] of Nephrology Task Force recommendations.    Glucose 02/04/2022 104  70 - 179 mg/dL Final    Calcium 72/53/6644 9.0  8.7 - 10.4 mg/dL Final    Albumin 03/47/4259 3.8  3.4 - 5.0 g/dL Final    Total Protein 02/04/2022 6.9  5.7 - 8.2 g/dL Final    Total Bilirubin 02/04/2022 0.7  0.3 - 1.2 mg/dL Final    AST 56/38/7564 26  <=34 U/L Final    ALT 02/04/2022 13  10 - 49 U/L Final    Alkaline Phosphatase 02/04/2022 184 (H)  46 - 116 U/L Final    TSH 02/04/2022 5.893 (H)  0.550 - 4.780 uIU/mL Final    WBC 02/04/2022 5.0  3.6 - 11.2 10*9/L Final    RBC 02/04/2022 3.57 (L)  3.95 - 5.13 10*12/L Final    HGB 02/04/2022 14.3  11.3 - 14.9 g/dL Final    HCT 33/29/5188 41.7  34.0 - 44.0 % Final    MCV 02/04/2022 116.7 (H)  77.6 - 95.7 fL Final    MCH 02/04/2022 40.1 (H)  25.9 - 32.4 pg Final    MCHC 02/04/2022 34.4  32.0 - 36.0 g/dL Final    RDW 41/66/0630 14.8  12.2 - 15.2 % Final    MPV 02/04/2022 8.1  6.8 - 10.7 fL Final    Platelet 02/04/2022 154  150 - 450 10*9/L Final    nRBC 02/04/2022 0  <=4 /100 WBCs Final    Neutrophils % 02/04/2022 48.5  % Final    Lymphocytes % 02/04/2022 40.6  % Final    Monocytes % 02/04/2022 9.2  % Final    Eosinophils % 02/04/2022 1.2  % Final    Basophils % 02/04/2022 0.5  % Final    Absolute Neutrophils 02/04/2022 2.4  1.8 - 7.8 10*9/L Final    Absolute Lymphocytes 02/04/2022 2.0  1.1 - 3.6 10*9/L Final    Absolute Monocytes 02/04/2022 0.5  0.3 - 0.8 10*9/L Final    Absolute Eosinophils 02/04/2022 0.1  0.0 - 0.5 10*9/L Final    Absolute Basophils 02/04/2022 0.0  0.0 - 0.1 10*9/L Final    Macrocytosis 02/04/2022 Moderate (A)  Not Present Final     For medical emergencies, please call 911 or go to the nearest emergency room.   If you have questions regarding your anesthesiology care, please call [phone] to reach a member of your anesthesia team.

## 2022-02-05 MED ORDER — DIPHENOXYLATE-ATROPINE 2.5 MG-0.025 MG TABLET
ORAL_TABLET | Freq: Four times a day (QID) | ORAL | 1 refills | 8 days | Status: CP | PRN
Start: 2022-02-05 — End: ?

## 2022-02-08 NOTE — Unmapped (Signed)
02/08/22 left vm to confirm 3/24, 4/14, and 5/5 @ 3:06pm.

## 2022-02-10 NOTE — Unmapped (Signed)
Pharmacist Medication Follow-Up  I called the patient to f/u on capecitabine and tucatinib.  I left a VM with my contact information.

## 2022-02-11 DIAGNOSIS — C50811 Malignant neoplasm of overlapping sites of right female breast: Principal | ICD-10-CM

## 2022-02-11 MED ORDER — CAPECITABINE 500 MG TABLET
ORAL_TABLET | Freq: Two times a day (BID) | ORAL | 5 refills | 30 days | Status: CP
Start: 2022-02-11 — End: ?
  Filled 2022-02-17: qty 60, 30d supply, fill #0

## 2022-02-11 NOTE — Unmapped (Signed)
[facility] Refill Coordination Note    Specialty Medication(s) to be Shipped:   Hematology/Oncology: [name] 50mg , Capecitabine 500mg , directions: Take 1 tablet (500 mg total) by mouth two (2) times a day, continuously. and Tukysa 150mg     Other medication(s) to be shipped: No additional medications requested for fill at this time     Brooke [name], DOB: 08/20/1956  Phone: [phone] (home)       All above HIPAA information was verified with patient.     Was a Nurse, learning disability used for this call? No    Completed refill call assessment today to schedule patient's medication shipment from the [facility] [phone]).  All relevant notes have been reviewed.     Specialty medication(s) and dose(s) confirmed: Regimen is correct and unchanged.   Changes to medications: Pearline reports no changes at this time.  Changes to insurance: No  New side effects reported not previously addressed with a pharmacist or physician: None reported  Questions for the pharmacist: No    Confirmed patient received a Conservation officer, historic buildings and a Surveyor, mining with first shipment. The patient will receive a drug information handout for each medication shipped and additional FDA Medication Guides as required.       DISEASE/MEDICATION-SPECIFIC INFORMATION        N/A    SPECIALTY MEDICATION ADHERENCE     Medication Adherence    Patient reported X missed doses in the last month: 0  Specialty Medication: Capecitabine 500mg   Patient is on additional specialty medications: Yes  Additional Specialty Medications: [name] 150mg   Patient Reported Additional Medication X Missed Doses in the Last Month: 0  Patient is on more than two specialty medications: Yes  Specialty Medication: [name] 50mg   Patient Reported Additional Medication X Missed Doses in the Last Month: 0  Informant: patient              Were doses missed due to medication being on hold? No    Capecitabine 500 mg: 10 days of medicine on hand   Tukysa 50 mg: 10 days of medicine on hand   Tukysa  150 mg: 10 days of medicine on hand       REFERRAL TO PHARMACIST     Referral to the pharmacist: Not needed      [facility]     Shipping address confirmed in Epic.     Delivery Scheduled: Yes, Expected medication delivery date: 02/16/22.  However, Rx request for refills was sent to the provider as there are none remaining.     Medication will be delivered via Next Day Courier to the prescription address in Epic WAM.    [name]   [facility] Specialty Technician

## 2022-02-17 MED FILL — TUKYSA 50 MG TABLET: 30 days supply | Qty: 120 | Fill #1

## 2022-02-17 MED FILL — TUKYSA 150 MG TABLET: ORAL | 30 days supply | Qty: 60 | Fill #1

## 2022-02-25 ENCOUNTER — Ambulatory Visit: Admit: 2022-02-25 | Discharge: 2022-02-25 | Payer: MEDICARE

## 2022-02-25 DIAGNOSIS — C50919 Malignant neoplasm of unspecified site of unspecified female breast: Principal | ICD-10-CM

## 2022-02-25 DIAGNOSIS — E876 Hypokalemia: Principal | ICD-10-CM

## 2022-02-25 DIAGNOSIS — R7989 Other specified abnormal findings of blood chemistry: Principal | ICD-10-CM

## 2022-02-25 DIAGNOSIS — R110 Nausea: Principal | ICD-10-CM

## 2022-02-25 DIAGNOSIS — C50811 Malignant neoplasm of overlapping sites of right female breast: Principal | ICD-10-CM

## 2022-02-25 DIAGNOSIS — E039 Hypothyroidism, unspecified: Principal | ICD-10-CM

## 2022-02-25 DIAGNOSIS — C7802 Secondary malignant neoplasm of left lung: Principal | ICD-10-CM

## 2022-02-25 DIAGNOSIS — Z171 Estrogen receptor negative status [ER-]: Principal | ICD-10-CM

## 2022-02-25 DIAGNOSIS — K900 Celiac disease: Principal | ICD-10-CM

## 2022-02-25 LAB — COMPREHENSIVE METABOLIC PANEL
ALBUMIN: 3.8 g/dL (ref 3.4–5.0)
ALKALINE PHOSPHATASE: 184 U/L — ABNORMAL HIGH (ref 46–116)
ALT (SGPT): 17 U/L (ref 10–49)
ANION GAP: 5 mmol/L (ref 5–14)
AST (SGOT): 30 U/L (ref <=34)
BILIRUBIN TOTAL: 0.6 mg/dL (ref 0.3–1.2)
BLOOD UREA NITROGEN: 8 mg/dL — ABNORMAL LOW (ref 9–23)
BUN / CREAT RATIO: 8
CALCIUM: 9.4 mg/dL (ref 8.7–10.4)
CHLORIDE: 111 mmol/L — ABNORMAL HIGH (ref 98–107)
CO2: 25 mmol/L (ref 20.0–31.0)
CREATININE: 1.02 mg/dL — ABNORMAL HIGH
EGFR CKD-EPI (2021) FEMALE: 62 mL/min/{1.73_m2} (ref >=60)
GLUCOSE RANDOM: 99 mg/dL (ref 70–179)
POTASSIUM: 3.3 mmol/L — ABNORMAL LOW (ref 3.4–4.8)
PROTEIN TOTAL: 7 g/dL (ref 5.7–8.2)
SODIUM: 141 mmol/L (ref 135–145)

## 2022-02-25 LAB — CBC W/ AUTO DIFF
BASOPHILS ABSOLUTE COUNT: 0 10*9/L (ref 0.0–0.1)
BASOPHILS RELATIVE PERCENT: 0.4 %
EOSINOPHILS ABSOLUTE COUNT: 0.1 10*9/L (ref 0.0–0.5)
EOSINOPHILS RELATIVE PERCENT: 1 %
HEMATOCRIT: 38.6 % (ref 34.0–44.0)
HEMOGLOBIN: 13.5 g/dL (ref 11.3–14.9)
LYMPHOCYTES ABSOLUTE COUNT: 2 10*9/L (ref 1.1–3.6)
LYMPHOCYTES RELATIVE PERCENT: 35.8 %
MEAN CORPUSCULAR HEMOGLOBIN CONC: 34.9 g/dL (ref 32.0–36.0)
MEAN CORPUSCULAR HEMOGLOBIN: 39.9 pg — ABNORMAL HIGH (ref 25.9–32.4)
MEAN CORPUSCULAR VOLUME: 114.3 fL — ABNORMAL HIGH (ref 77.6–95.7)
MEAN PLATELET VOLUME: 7.8 fL (ref 6.8–10.7)
MONOCYTES ABSOLUTE COUNT: 0.4 10*9/L (ref 0.3–0.8)
MONOCYTES RELATIVE PERCENT: 7.2 %
NEUTROPHILS ABSOLUTE COUNT: 3.1 10*9/L (ref 1.8–7.8)
NEUTROPHILS RELATIVE PERCENT: 55.6 %
NUCLEATED RED BLOOD CELLS: 0 /100{WBCs} (ref <=4)
PLATELET COUNT: 139 10*9/L — ABNORMAL LOW (ref 150–450)
RED BLOOD CELL COUNT: 3.38 10*12/L — ABNORMAL LOW (ref 3.95–5.13)
RED CELL DISTRIBUTION WIDTH: 14.1 % (ref 12.2–15.2)
WBC ADJUSTED: 5.5 10*9/L (ref 3.6–11.2)

## 2022-02-25 LAB — MAGNESIUM: MAGNESIUM: 1.6 mg/dL (ref 1.6–2.6)

## 2022-02-25 MED ADMIN — trastuzumab (HERCEPTIN) 340 mg in sodium chloride (NS) 0.9 % 250 mL IVPB: 6 mg/kg | INTRAVENOUS | @ 18:00:00 | Stop: 2022-02-25

## 2022-02-25 MED ADMIN — sodium chloride (NS) 0.9 % infusion: 100 mL/h | INTRAVENOUS | @ 18:00:00 | Stop: 2022-02-25

## 2022-02-25 NOTE — Unmapped (Signed)
Patient arrived to infusion, in stable condition, for Trastuzumab infusion. Port accessed and labs collected as ordered. Trastuzumab released to pharmacy. Infusion administered as ordered. Patient tolerated well. No adverse reactions noted. K level low at 3.3, replacement ordered by APP. Patient refused to take replacement in infusion, stating she is taking supplements at home. APP notified. AVS declined. Port flushed, hep locked, and deaccessed per protocol. Patient discharged home to self care, in stable condition, escorted by sister.

## 2022-02-25 NOTE — Unmapped (Signed)
[facility]   Medical Nutrition Therapy Consultation       Visit Type:    Return Assessment    Referral Reason:   Nutrition Follow-up    Brooke [name] is a 66 y.o. female seen for medical nutrition therapy follow-up for weight loss and decreased intake in the setting of metastatic breast cancer to the lung and brain. Current therapy consists of Xeloda, tucatinib and herceptin. She was seen with her sister in the room at time of visit. Intake continues to be decreased.  Her medication list, allergies, notes from last several encounters and lab results were reviewed.     Past Medical History:   Diagnosis Date   ??? Abnormal ECG unsure   ??? Alcoholism (CMS-HCC)    ??? Allergic rhinitis    ??? Anemia    ??? Anxiety     insomnia   ??? Arthritis not sure   ??? Brain concussion    ??? Breast cancer (CMS-HCC)     chemo for now with mets   ??? COPD (chronic obstructive pulmonary disease) (CMS-HCC)    ??? Depression    ??? Dysphagia    ??? Fatty liver 07/08/2021   ??? Genitourinary disease    ??? GERD (gastroesophageal reflux disease)    ??? Headache    ??? HTN (hypertension)    ??? Hyperlipidemia    ??? Hypothyroidism, unspecified type 01/21/2022   ??? Insomnia    ??? 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 Stroke (CMS-HCC) Nov. 2020    Dr. Theodoro Kalata   ??? Tobacco dependence        Anthropometrics   Height:  175.5 cm  Current Weight:   52.4 (kg) 02/25/22  BMI: 21.13 kg/m2   Ideal Body Weight:  50 (kg)   Percent Ideal Body Weight: 105%    Weight history:  Wt Readings from Last 6 Encounters:   02/25/22 52.4 kg (115 lb 8.3 oz)   02/25/22 52.5 kg (115 lb 12.8 oz)   02/04/22 51.9 kg (114 lb 6.4 oz)   02/04/22 51.8 kg (114 lb 1.6 oz)   01/21/22 52.6 kg (116 lb)   01/14/22 51.5 kg (113 lb 8 oz)     Nutrition Risk Screening:   Food Insecurity: Food Insecurity Present   ??? Worried About Programme researcher, broadcasting/film/video in the Last Year: Sometimes true   ??? Ran Out of Food in the Last Year: Sometimes true       ASPEN/AND Malnutrition Screening:   Pt meets ASPEN/AND criteria for malnutrition as noted below:  Malnutrition Designation: Non-severe in context of chronic illness-continues  -- Weight loss: does not meet criteria for weight loss  -- Energy Intake: intake <75% for > 1 month  -- Muscle Mass: mild interosseous wasting noted, mild temporal wasting noted  -- Fat Mass: mild orbital wasting  -- Fluid Accumulation: did not assess  -- Functional Assessment: did not assess    Biochemical Data, Medical Tests and Procedures:  All pertinent labs and imaging reviewed by Genevie Ann at 11:24 AM 02/28/2022.    Medications and Vitamin/Mineral Supplementation:   All nutritionally pertinent medications reviewed on 02/25/2022.   She is not taking nutrition supplements. Has not started multivitamin due to cost.    Current Outpatient Medications   Medication Sig Dispense Refill   ??? albuterol HFA 90 mcg/actuation inhaler Inhale 2 puffs every six (6) hours as needed for wheezing.     ??? aluminum-magnesium hydroxide-simethicone  200-200-20 mg/5 mL Susp 80 mL, diphenhydrAMINE 12.5 mg/5 mL Liqd 200 mg, nystatin 100,000 unit/mL Susp 8,000,000 Units, distilled water Liqd 80 mL, lidocaine 2% viscous 2 % Soln 80 mL Take 5 mL by mouth every six (6) hours as needed. Swish, gargle, spit or swallow if throat issues 80 mL 2   ??? capecitabine (XELODA) 500 MG tablet Take 1 tablet (500 mg total) by mouth two (2) times a day, continuously. To be taken every day with no breaks 60 tablet 5   ??? clobetasoL (TEMOVATE) 0.05 % ointment Apply twice a day to rash on palm until smooth/clear. 30 g 1   ??? clonazePAM (KLONOPIN) 0.5 MG tablet Take 0.5 mg daily as needed for anxiety.  Do not exceed 0.5 mg/day. 30 tablet 0   ??? diclofenac sodium (VOLTAREN) 1 % gel Apply 2 g topically four (4) times a day as needed for arthritis or pain. (Patient not taking: Reported on 02/25/2022) 150 g 2   ??? diphenoxylate-atropine (LOMOTIL) 2.5-0.025 mg per tablet Take 1 tablet by mouth four (4) times a day as needed for diarrhea. 30 tablet 1   ??? dorzolamide (TRUSOPT) 2 % ophthalmic solution Administer 1 drop into the left eye Three (3) times a day. 10 mL 12   ??? esomeprazole (NEXIUM) 40 MG capsule Take 1 capsule (40 mg total) by mouth in the morning. 90 capsule 3   ??? FARXIGA 5 mg Tab tablet Take 5 mg by mouth daily.     ??? HYDROcodone-acetaminophen (NORCO 10-325) 10-325 mg per tablet Take 1 tablet by mouth every twelve (12) hours as needed for pain. 60 tablet 0   ??? lidocaine (LIDODERM) 5 % patch Place 1 patch on the skin daily. Apply to affected area for 12 hours only each day (then remove patch) 30 patch 3   ??? lisinopriL-hydrochlorothiazide (PRINZIDE,ZESTORETIC) 20-25 mg per tablet Take 1 tablet by mouth daily. 90 tablet 3   ??? MAGNESIUM ORAL Take by mouth daily. OTC (Patient not taking: Reported on 02/04/2022)     ??? mirtazapine (REMERON) 7.5 MG tablet      ??? naloxone (NARCAN) 4 mg nasal spray One spray in either nostril once for known/suspected opioid overdose. May repeat every 2-3 minutes in alternating nostril til EMS arrives (Patient not taking: Reported on 11/11/2021) 2 each 0   ??? ondansetron (ZOFRAN) 4 MG tablet Take 1 tablet (4 mg total) by mouth every six (6) hours as needed for nausea. 30 tablet 1   ??? potassium chloride (KLOR-CON) 20 mEq packet Take 1 packet (20 mEq total) by mouth Two (2) times a day. 60 packet 0   ??? pregabalin (LYRICA) 200 MG capsule Take 1 capsule (200 mg total) by mouth Two (2) times a day. 60 capsule 2   ??? prochlorperazine (COMPAZINE) 10 MG tablet Take 1 tablet (10 mg total) by mouth every six (6) hours as needed for nausea. 30 tablet 6   ??? senna (SENOKOT) 8.6 mg tablet Take 3 tablets by mouth in the morning. 30 tablet 2   ??? thiamine (VITAMIN B-1) 50 MG tablet Take 1 tablet (50 mg total) by mouth in the morning. (Patient not taking: Reported on 02/04/2022) 90 tablet 1   ??? traZODone (DESYREL) 50 MG tablet Take 1.5 tablets (75 mg total) by mouth nightly. 45 tablet 1   ??? tucatinib (TUKYSA) 150 mg tablet Take 1 tablet (150 mg total) by mouth two (2) times a day. 60 tablet 1   ??? tucatinib (TUKYSA) 50 mg tablet  Take 2 tablets (100 mg total) by mouth two (2) times a day . 120 tablet 3   ??? venlafaxine (EFFEXOR-XR) 150 MG 24 hr capsule Take 2 capsules (300 mg total) by mouth daily. 60 capsule 5     No current facility-administered medications for this visit.     Facility-Administered Medications Ordered in Other Visits   Medication Dose Route Frequency Provider Last Rate Last Admin   ??? OKAY TO SEND MEDICATION/CHEMOTHERAPY TO OUTPATIENT UNIT   Other Once Clarissa Trixie Dredge, PA       ??? potassium chloride (KLOR-CON) packet 40 mEq  40 mEq Oral Once Saunders Glance, AGNP       ??? sodium chloride (NS) 0.9 % infusion  100 mL/hr Intravenous Continuous Clarissa Trixie Dredge, [place]   Stopped at 02/25/22 1312       Nutrition History:     Dietary Restrictions: She reported intolerance to gluten containing foods in setting of celiac disease. She reported hx of dx of Celiac.     Gastrointestinal Issues: No issues noted upon assessment. Denied episodes of diarrhea    Hunger and Satiety: Poor appetite and limited intake.     Food Safety and Access: She expressed limited access to food-previously enrolled in the [employer] and groceries were provided.    Diet Recall:   Breakfast: 2 fried eggs, 4 pieces of bacon, glass of whole milk, coffee and juice    Enjoys breakfast foods and will usually just eat breakfast and little to nothing else the rest of the day. Yesterday she ate breakfast and snacked on an orange later in the day. She cooked BBQ ribs last night but did not eat any. On the weekends she stays with her boyfriend and he cooks/prepares most of her meals. She notes she eats better on the weekends.     Nutritional Supplements: Has tried Ensure Plus and Clear but did not like the taste. Has [employer] at home but has yet to try.     Food-Related History:  Snacks:  Fruit, cheese puffs, popcorn  Cooking Methods: She prefers to not cook much at this time     Nutrition Assessment     Inadequate oral intake related to metastatic breast cancer and social, enviromental and financial factors as evidenced by eating ~ 1 meal daily, unable to cook often, food insecurity reported and 7.5% weight loss noted x 4.5 months-continues  ??  Daily Estimated Nutritional Needs: Weight of 52.5 (kg) used to calculate needs  Energy:  1600-1850 kcals [  using  30-35kcal/kg  ]  Protein:   63-73 gm [  using  1.2-1.4g/kg  ]  Fluid:   1600-1859ml [ 40ml/kcal]  ??  Nutrition Goals & Evaluation      Intake will aim to meet >75% of estimated nutrient needs  (Not Met and Ongoing)  Aim for weight maintenance  (Ongoing)    Nutrition goals reviewed, and relevant barriers identified and addressed: acuity of illness and financial concerns. She is evaluated to have fair willingness and ability to achieve nutrition goals.     Nutrition Intervention      - Oral Nutrition Supplementation: Carnation Instant Breakfast  - Meals and Snacks    Nutrition Plan:   ??? Encouraged increasing intake of food during the day. Suggested eating breakfast type food more frequently during the day as she tends to prefer these types of foods. Discussed various easy to prepare meals/frozen dishes, etc.   ??? Recommended [employer], 1-2  daily for added nutrition. Discussed that the packets may contain wheat so to be cautious in the setting of gluten allergy. The pre-made [employer] Breakfast shakes are gluten free.  ??? We discussed holding on multivitamin at this time due to financial concerns. Should she be able to tolerate [employer], she will receive some vitamins/minerals from these drinks.    Follow up will occur in 1-2 months as able, as desired by patient.       Food/Nutrition-related history, Anthropometric measurements, Biochemical data, medical tests, procedures, Nutrition-focused physical findings, Patient understanding or compliance with intervention and recommendations  and Effectiveness of nutrition interventions will be assessed at time of follow-up.     Recommendations for Care Team :  Encourage increasing PO intake during the day and eating more of the foods she enjoys such as breakfast type foods.   Recommend nutritional supplements, such as [employer], 1-2 per day for added nutrition/as tolerated.       Time spent 30 minutes.

## 2022-03-03 NOTE — Unmapped (Unsigned)
[facility]  Comprehensive Cancer Support Program/Psychiatry   Established Patient E&M Service - Outpatient     Encounter Description: in person    Name: Brooke [name]  Date: 03/03/2022  MRN: [id number]  DOB: [birth year]    Assessment:  [name] presents for follow-up evaluation for depression and anxiety.    In the interim since last appointment, she has had another MRI brain, CT and bone scan showing no evidence of progression, and she has had potassium increased and tucatinib decreased (both related to chronic diarrhea). She continues on Xeloda and Herceptin as well (fourth line).     She repots stable mood and appetite but some increased stress due to taking care of sister after a bad infection, and this has lead to every day use of clonazepam. She asks about what medications could be affecting her memory, and we discuss potential contributions of clonazepam, opioids, and daily cannabis. She is still on a very low dose of clonazepam, and we will plan to have another visit in one month to assess the pattern of its use, consider alternate therapies, and reassess cognition/memory. We will not make medication changes today.     Identifying Information:  Brooke [name] is a 66 y.o. 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 Last treatment in 2020 (radiation to right frontal brain met post-op bed). She continues on capecitabine (Xeloda, pyrimidine analog) 500mg  BID, tucatinib (anti HER-2)150mg  BID, trastuzumab (Herceptin, anti-HER2) q3 weeks.    Risk Assessment:  An assessment of suicide and violence risk factors was performed as part of this evaluation and is not significantly changed from the last visit.While future psychiatric events cannot be accurately predicted, the patient does not currently require acute inpatient psychiatric care and does not currently meet [facility] involuntary commitment criteria.      Plan:  Problem 1: Major depressive disorder, recurrent episode   Status of problem: chronic and stable  Interventions:   - Continue Effexor XR 300mg  PO daily to target mood, anxiety, and hot flashes.    - Previously established with [facility] psychotherapist [facility], not currently in therapy    Problem 2: Unspecified anxiety disorder  Status of problem: chronic and stable  Interventions:   - Continue Klonopin 0.5mg  qday PRN anxiety, last filled Nov 22. She has historically used this medication very sparingly (reportedly about 2x/week) but recently using nightly.   - Discussed risk/benefit again with her 02/03/21, in particular focusing on potential of benzodiazepines to cause or worsen cognitive deficits. Previously strongly discouraged her from taking klonopin unless absolutely necessary for intense anxiety    Problem 3: Insomnia  Status of problem: chronic and stable  Interventions:   - Continue Trazodone 75 mg nightly prn insomnia    Problem 4: Potential Sleep Apnea  Status of problem: Stable  Interventions:  - Ambulatory referral to Sleep center was placed in 10/2020 but she declined    Problem 5: Mild Neurocognitive Disorder  Status of problem: stable. MOCA 20/30 on 02/03/21  Interventions:  - Continue to monitor, patient noticing memory deficits like forgetting date, forgetting exactly where she is going. Denies leaving stove on or getting lost.  - Recommended pill organizer, alarms on phone and schedule/spreadsheet where she can mark doses once she has taken them    Follow-up: 3/9 at 3:30, video visit  Psychotherapy provided:  No billable psychotherapy service provided.    Patient has been given this writer's contact information as well as the [facility] Psychiatry urgent line number. The patient has been instructed to call 911 for emergencies.    Patient was seen and plan of care was discussed with the Attending MD,Dr. Lawernce Keas, who agrees with the above statement and plan. ***      Subjective:    Chief complaint:  Follow-up psychiatric evaluation for anxiety and depression    Interval History:     Medical updates:  Remeron?    No big updates. Losing weight but that has been on purpose. Started walking around her apartment, goal was 120 (now 116). Having trouble with some food textures, like bread. She eats bacon and eggs and cheerios for breakfast, fresh veggies, a starch, and meat for dinner. Doesn't eat lunch. Also drinks high-protein carnation drinks    Effexor: loves it. Doesn't want to change it, 2 pills (300mg  total) in the morning. Sets out medicine each night and takes it with milk. Enables her to sleep without worrying about things, helps with night sweats. Staying even-keeled, not getting angry or frustrated with anyone    Going to sister's house daily for two weeks, 2 weeks ago sister got sepsis and was very sick. Was getting PT at home and Janeliz was helping around the house. Other sister helping her too after work. They're afraid she would fall. Jacayla used to have a life alert    Not had much time to relax lately because she is caring for sister. She is tired a lot of the time, brain gets tired as well. She has memory issues and she knows it. Sticks to her same routine. Goes out shopping. Recently went out and realized she passed the store she wanted to go to.    Trazodone 75mg  nightly helps get her to sleep but she is still not very rested. Gets up between 3-5am, makes her coffee, goes back to bed sometimes for a few more hours.     Clonazepam uses once a day at maximum. Previously about twice a week, now about every night. Feels more calm when uses it, takes before scans/appts.     Mirtazapine: thought was sleeping pill but stopped it    No SI, says she couldn't do that to her son and God doesn't want her to do that. Depression for her is not eating enough or eating too much, sadness and crying in bed, racing thoughts. Right now not having these symptoms. Used to see [facility]- interested in talking to her again.     Alcohol: quit but very rarely glass of wine with dinner. Had alcohol on Christmas or New Years  Marijuana: smokes one bowl every night, relaxing and helps with pain. One bowl. Smoked since 66 years old  Tobacco .75 pack.day, prev down to half pack daily but has been very hard to quit      Objective:    Mental Status Exam:  Appearance:    Appears stated age, Clean/Neat and wearing make-up and clean matching outfit   Motor:   No abnormal movements   Speech/Language:    Normal rate, volume, tone, fluency and Language intact, well formed   Mood:   good   Affect:   Calm, Cooperative and Euthymic, can laugh   Thought process and Associations:   Mostly logical, linear, clear, coherent, goal directed. At times gives answer only tangentially related to question, sometimes concrete  Abnormal/psychotic thought content:     Denied SI, denied HI. No evidence of paranoia or delusions.   Perceptual disturbances:     No AVH     Memory/recall  Historically poor   Attention  Able to concentrate and attend to conversation but not formally tested   Orientation  Did not formally assess but seems grossly oriented   Other:   Insight and judgment fair.  Fair impulse control.       Maralyn Sago, MD

## 2022-03-07 MED ORDER — MIRTAZAPINE 7.5 MG TABLET
ORAL_TABLET | 0 refills | 0 days
Start: 2022-03-07 — End: ?

## 2022-03-07 NOTE — Unmapped (Signed)
Please refill if appropriate.     Most Recent Clinic Visit:Visit date not found  Next Clinic Visit: 03/28/2022

## 2022-03-09 MED ORDER — CLONAZEPAM 0.5 MG TABLET
ORAL_TABLET | 0 refills | 0 days
Start: 2022-03-09 — End: ?

## 2022-03-10 MED ORDER — CLONAZEPAM 0.5 MG TABLET
ORAL_TABLET | 0 refills | 0 days | Status: CP
Start: 2022-03-10 — End: ?

## 2022-03-10 NOTE — Unmapped (Signed)
Discussed clonazepam use after I received a refill request. She missed our appointment on 3/9, still going through stressful time with taking care of sister and has upcoming scans. Uses clonazepam every day at 2 or 3pm to be able to get through the day caring for her sister and running errands. Sister's surgery and patient's MRI coming up in a week, and she thinks after that stress with improve. Okay to use clonazepam for one more month, fill last month and now are appropriate. Again discussed cognitive risks, will make appointment 3/30 to see me and decide if there is another medication we can use for anxiety or if we need to adjust other psychiatric medications.    Maralyn Sago, MD

## 2022-03-14 DIAGNOSIS — T451X5A Adverse effect of antineoplastic and immunosuppressive drugs, initial encounter: Principal | ICD-10-CM

## 2022-03-14 DIAGNOSIS — G620 Drug-induced polyneuropathy: Principal | ICD-10-CM

## 2022-03-14 DIAGNOSIS — C50811 Malignant neoplasm of overlapping sites of right female breast: Principal | ICD-10-CM

## 2022-03-14 MED ORDER — TUKYSA 150 MG TABLET
ORAL_TABLET | Freq: Two times a day (BID) | ORAL | 1 refills | 30 days
Start: 2022-03-14 — End: ?

## 2022-03-14 MED ORDER — PREGABALIN 200 MG CAPSULE
ORAL_CAPSULE | Freq: Two times a day (BID) | ORAL | 0 refills | 30 days | Status: CP
Start: 2022-03-14 — End: ?

## 2022-03-14 NOTE — Unmapped (Signed)
[facility] Refill Coordination Note    Specialty Medication(s) to be Shipped:   Hematology/Oncology: [name] 50mg , Capecitabine 500mg , directions: Take 1 tablet (500 mg total) by mouth two (2) times a day, continuously. and Tukysa 150mg     Other medication(s) to be shipped: No additional medications requested for fill at this time     KELI BUEHNER, DOB: [birth year]  Phone: [phone] (home)       All above HIPAA information was verified with patient.     Was a Nurse, learning disability used for this call? No    Completed refill call assessment today to schedule patient's medication shipment from the [facility] [phone]).  All relevant notes have been reviewed.     Specialty medication(s) and dose(s) confirmed: Regimen is correct and unchanged.   Changes to medications: Laparis reports no changes at this time.  Changes to insurance: No  New side effects reported not previously addressed with a pharmacist or physician: None reported  Questions for the pharmacist: No    Confirmed patient received a Conservation officer, historic buildings and a Surveyor, mining with first shipment. The patient will receive a drug information handout for each medication shipped and additional FDA Medication Guides as required.       DISEASE/MEDICATION-SPECIFIC INFORMATION        N/A    SPECIALTY MEDICATION ADHERENCE     Medication Adherence    Patient reported X missed doses in the last month: 0  Specialty Medication: capecitabine 500 MG tablet (XELODA)  Patient is on additional specialty medications: Yes  Additional Specialty Medications: TUKYSA 150 mg tablet (tucatinib)  Patient Reported Additional Medication X Missed Doses in the Last Month: 0  Patient is on more than two specialty medications: Yes  Specialty Medication: TUKYSA 50 mg tablet (tucatinib)  Patient Reported Additional Medication X Missed Doses in the Last Month: 0  Informant: patient              Were doses missed due to medication being on hold? No    Capecitabine 500 mg: 12 days of medicine on hand   Tukysa 50 mg: 12 days of medicine on hand   Tukysa  150 mg: 12 days of medicine on hand       REFERRAL TO PHARMACIST     Referral to the pharmacist: Not needed      [facility]     Shipping address confirmed in Epic.     Delivery Scheduled: Yes, Expected medication delivery date: 03/23/22.  However, Rx request for refills was sent to the provider as there are none remaining.     Medication will be delivered via Next Day Courier to the prescription address in Epic WAM.    [name]   [facility] Specialty Technician

## 2022-03-15 MED ORDER — TUCATINIB 150 MG TABLET
ORAL_TABLET | Freq: Two times a day (BID) | ORAL | 1 refills | 30 days | Status: CP
Start: 2022-03-15 — End: ?
  Filled 2022-03-22: qty 60, 30d supply, fill #0

## 2022-03-16 ENCOUNTER — Ambulatory Visit: Admit: 2022-03-16 | Discharge: 2022-03-17 | Payer: MEDICARE

## 2022-03-16 ENCOUNTER — Ambulatory Visit
Admit: 2022-03-16 | Discharge: 2022-03-17 | Payer: MEDICARE | Attending: Radiation Oncology | Primary: Radiation Oncology

## 2022-03-16 DIAGNOSIS — F5101 Primary insomnia: Principal | ICD-10-CM

## 2022-03-16 MED ORDER — TRAZODONE 50 MG TABLET
ORAL_TABLET | Freq: Every evening | ORAL | 0 refills | 0 days
Start: 2022-03-16 — End: ?

## 2022-03-16 MED ADMIN — heparin, porcine (PF) 100 unit/mL injection 500 Units: 500 [IU] | INTRAVENOUS | @ 18:00:00 | Stop: 2022-03-17

## 2022-03-16 MED ADMIN — sodium chloride (NS) 0.9 % flush 10 mL: 10 mL | INTRAVENOUS | @ 18:00:00 | Stop: 2022-03-17

## 2022-03-16 MED ADMIN — sodium chloride (NS) 0.9 % flush 10 mL: 10 mL | INTRAVENOUS | @ 17:00:00 | Stop: 2022-03-17

## 2022-03-16 MED ADMIN — gadobenate dimeglumine (MULTIHANCE) 529 mg/mL (0.1mmol/0.2mL) solution 10 mL: 10 mL | INTRAVENOUS | @ 18:00:00 | Stop: 2022-03-16

## 2022-03-16 NOTE — Unmapped (Signed)
Radiation Oncology Follow Up Visit Note    Telemedicine Visit  This patient visit was completed through the use of an audio/video or telephone encounter.      This patient encounter is appropriate and reasonable under the circumstances given the patient's particular presentation at this time. The patient has been advised of the potential risks and limitations of this mode of treatment (including, but not limited to, the absence of in-person examination) and has agreed to be treated in a remote fashion in spite of them. Any and all of the patient's/patient's family's questions on this issue have been answered.     The patient has also been advised to contact this office for worsening conditions or problems, and seek emergency medical treatment and/or call 911 if the patient deems either necessary.    Patient identity verbally confirmed: Yes.  Verbal consent for telemedicine encounter: Yes.  Location of [place], [place]): [place]  Location of provider [place], [place]): [place]  Encounter medium: audio  Software platform: Telephone  Attendees: Dr. Flonnie Hailstone  Total time: 10 minutes      Referring Provider: Jenell [name]    Patient Name: Brooke [name]  Patient Age: 66 y.o.  Encounter Date: 03/16/2022    Referring Physician:   [name], MD  [address]  [facility]  [place],  [place] [address]    Primary Care Provider:  [name], MD    Diagnoses:  1. Brain metastases (CMS-HCC)        Treatment Site:   7 Brain Mets, 20 Gy x 1 = 20 Gy completed 09/05/2017  R Frontal  L Post Frontal  L Ant Frontal  R Sup Occipital  L Occipital  R Inf Occipital  L Temporal    Lt Med Occipital Met, 20 Gy x 1 = 20 Gy completed 09/13/2018    Rt Frontal Met, 5 Gy x 5 = 25 Gy completed 02/28/2019    Interval Since Completion of Treatment: 4 years and 6 months from initial and 3 years from most recent      Assessment: APRYLL [name] is a 66 y.o. female with metastatic breast cancer, ER-/PR+/Her2+, s/p CK to 7 lesions in September 2018, 1 lesion in 08/2018, resection of a right frontal mass  (01/15/2019) which was previously irradiated but with pathology showing progressive HER2+ breast cancer, completed 25 Gy in 5 fx to right frontal post-op bed on 02/28/2019.      She has been cared for systemically by Dr Brooke [name]-->Dr. Avis [name]. Presently she is taking capecitabine, tucatinib, and trastuzumab q 3 weeks.    Plan:     -Disease Status: stable intracranial disease     -Follow-up: 4 month with MRI Brain w and wo contrast, can have virtual visit after if unable to come to [facility]    Interval History:  Brooke [name] returns today for a regularly scheduled follow-up. She continues to have headaches for which she takes Norco.  R ear pain worse past few weeks.  Vision changes stable--follows with ophtho regularly.  Overall feels worn out and tired, but she has had a lot of stressors (sister having surgery, other family issues, etc.).  She states otherwise she hasn't had any other changes.      Review of Systems: All other systems reviewed are negative. Pertinent positives and negatives are above in interval history.    Past Medical, Surgical, Family and Social Histories reviewed and updated in the electronic medical record.    Oncology History  Overview Note   Identifying Statement:  Brooke [name] is a 66 y.o. female diagnosed with a right posterior frontal lobe metastasis (breast primary) status post resection 01/15/2019 and 5/5 fractions of SBRT (2500 Gy via CyberKnife) to the resection cavity.    Treatment History:  09/05/17: S/p SRS to 7 lesions  01/15/19: S/p resection, followed by SRS 2500 cGy   03/04/19: KPS 80; MRI with postsurgical changes versus residual disease; RTC in 2 weeks w/ MRI   04/15/19: KPS NA; MRI w/ SD; no study; RTC 6 weeks w/ MRI   06/10/19: KPS 80; MRI w/ SD; RTC 4 weeks  07/15/19: KPS 80; MRI w/ SD; RTC 8 weeks  08/19/19: KPS 80; start nortriptyline for HAs; RTC 4 weeks w/ MRI  09/16/19: KPS 80; con't nortriptyline for HAs; RTC 2 mos w/ MRI  11/11/19: KPS 80; MRI w/ SD though w/ small infarct; proceed w/ CVA workup; start 81mg  ASA; encouraged smoking cessation; con't nortriptyline; RTC to discuss results  11/25/19: KPS 80; discussed CVA workup results; MRA unremarkable; con't smoking cessation efforts; con't nortriptyline for HAs; con't 81mg  ASA, start Lipitor; f/u with PCP for COPD eval; RTC 2 mos w/ MRI     Malignant neoplasm of overlapping sites of right female breast (CMS-HCC)   2017 -  Presenting Symptoms    Large open wound RT breast which began as nipple inversion. Patient states that she ignored for a long time.  Physical exam of the area of concern in the RTbreast demonstrates a large open wound in the lateral right breast. Saw PCP 01/27/16 Rx Keflex.     02/04/2016 Interval Scan(s)    MMG/US: 3.1 (3.0) cm irregular spiculated dense mass in lateral Rt breast w nipple and skin retraction. 2 subcentimeter irregular masses in the RUOQ  (10', 11') (possible satellite lesions). Large Rt axillary lymph node 1.7. Lt breast clear. BIRAD 5.     02/08/2016 Biopsy    US guided Rt. Axillary LN biopsy:  Gr 3, IDC with apocrine features. ER (-), PR (31-40), HER-2 (3+). Noted to have multiple skin lesions. Poor access to breast mass due open 10-15 cm wound risk of pain and bleeding.     02/09/2016 Initial Diagnosis    Malignant neoplasm of overlapping sites of right female breast (RAF-HCC)     02/10/2016 Interval Scan(s)    CT CAP: Large, necrotic right breast mass with wide open tract to the skin, consistent with known right breast malignancy.  Numerous enlarged right axillary, subpectoral, mediastinal, bilateral hilar lymph nodes and innumerable bilateral pulmonary nodules     02/10/2016 Interval Scan(s)    NM Bone scan: No osseous metastatic disease.     04/28/2016 - 03/10/2020 Chemotherapy    OP TRASTUZUMAB (EVERY 21 DAYS)  Trastuzumab 8 mg/kg loading then 6 mg/kg every 21 days     07/13/2016 Genetics    STRATA  ??? ERBB2 copy number alteration  Estimated copy number: 40, confidence interval: 35.7 - 44.0, cellularity: 50%  Associated FDA-approved targeted therapies in breast cancer: trastuzumab, lapatinib, ado-trastuzumab emtansine, pertuzumab  ??? [id number] [id number]     01/03/2020 -  Cancer Staged    CT CAP 01/03/2020 which showed a new lingular opacity measuring 1.9 cm in the right lung. There was also slight increase in the right breast mass from 1.2-> 1.5 cm.  Bone scan shows no osseous metastases. Overall I considered these findings indeterminate and ppted to repeat CT CAP in 1 month     02/03/2020 -  Cancer Staged  CT chest 02/03/20 which showed that the lingular lesion has persisted and increased in size.  No mediastinal adenopathy reported.  She continues to report a nonproductive cough and mild dyspnea.  She is a chronic smoker and currently smokes on average half a pack per day.  My major concern is that this could be a second primary lung cancer as her other sites of diseases stable on current systemic therapy       02/03/2020 Endocrine/Hormone Therapy    Stop Tamoxifen, Add Fasoldex, continue Q3week Hercpetin     03/23/2020 - 02/09/2021 Chemotherapy    OP BREAST ADO-TRASTUZUMAB EMTANSINE  ado-trastuzumab emtansine 3.6 mg/kg IV on day 1, every 21 days     04/12/2021 -  Chemotherapy    Tucatinib + Capecitabine  OP TRASTUZUMAB (EVERY 21 DAYS)  trastuzumab 8 mg/kg IV LOADING, then 6 mg/kg IV MAINT, every 21 days     Brain metastases (CMS-HCC)   08/07/2017 Initial Diagnosis    Brain metastases (CMS-HCC)    Summary of Radiosurgery   Rx:7 brain met: 09/05/2017: 2,000/2,000 cGy  Sec:R Frontal: 09/05/2017: 2,000 cGy  Sec:L Post Fr: 09/05/2017: 2,000 cGy  Sec:L Ant Fro: 09/05/2017: 2,000 cGy  Sec:R Sup Oc: 09/05/2017: 2,000 cGy  Sec:L Occipita: 09/05/2017: 2,000 cGy  Sec:R Inf Occi: 09/05/2017: 2,000 cGy  Sec:L Tempor: 09/05/2017: 2,000 cGy  Rx:Lt Med Oc: 09/13/2018: 2,000/2,000 cGy  Rx:Rt Frontal: : 2,500/2,500 cGy    01/15/2019: Craniotomy and resection of right posterior frontal lobe metastasis. Followed by CK    03/04/19: KPS 80; MRI with postsurgical changes versus residual disease; RTC in 2 weeks w/ MRI     01/03/2020 -  Cancer Staged    CT CAP 01/03/2020 which showed a new lingular opacity measuring 1.9 cm in the right lung. There was also slight increase in the right breast mass from 1.2-> 1.5 cm.  Bone scan shows no osseous metastases. Overall I considered these findings indeterminate and ppted to repeat CT CAP in 1 month     02/03/2020 -  Cancer Staged    CT chest 02/03/20 which showed that the lingular lesion has persisted and increased in size.  No mediastinal adenopathy reported.  She continues to report a nonproductive cough and mild dyspnea.  She is a chronic smoker and currently smokes on average half a pack per day.  My major concern is that this could be a second primary lung cancer as her other sites of diseases stable on current systemic therapy       02/03/2020 Endocrine/Hormone Therapy    Stop Tamoxifen, Add Fasoldex, continue Q3week Hercpetin     Metastatic breast cancer (CMS-HCC)   01/25/2019 Initial Diagnosis    Metastatic breast cancer (CMS-HCC)     03/23/2020 - 02/09/2021 Chemotherapy    OP BREAST ADO-TRASTUZUMAB EMTANSINE  ado-trastuzumab emtansine 3.6 mg/kg IV on day 1, every 21 days     04/12/2021 -  Chemotherapy    Tucatinib + Capecitabine  OP TRASTUZUMAB (EVERY 21 DAYS)  trastuzumab 8 mg/kg IV LOADING, then 6 mg/kg IV MAINT, every 21 days     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 DATE: 03/16/2022 1:47 PM ACCESSION: [id number] UN DICTATED: 03/16/2022 2:24 PM INTERPRETATION LOCATION: [facility]     CLINICAL INDICATION: 66 years old Female with follow up brain metastasis  - C79.31 - Brain metastases (CMS - HCC)      COMPARISON: MRI of brain 11/17/2021, 07/14/2021, other prior studies     TECHNIQUE: Multiplanar, multisequence MR imaging of the brain was performed without and with I.V. contrast.     FINDINGS:  Redemonstrated post surgical changes of right frontal craniotomy. No evidence of recurrent disease at site of prior resection. 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 Unchanged focus of abnormal enhancement left frontal lobe (19:116). Redemonstrated T2 FLAIR hyperintensities in the bilateral periventricular white matter, and at site of prior resection. FLAIR signal appears similar compared to MRI from 11/17/2021. Unchanged microhemorrhages in the right caudate and left corona radiata. Ventricles are normal in size. There is no midline shift. No extra-axial fluid collection. No evidence of intracranial hemorrhage. No diffusion weighted signal abnormality to suggest acute infarct.     No mass.         1. Unchanged to slightly decreased size of left frontal lobe metastasis. 2. No evidence of new metastases.            Mina Marble, MD, PhD  Assistant Professor  Department of Radiation Oncology  [facility]  [address]  [place], [place] [address]  O: [phone]  P: [phone]

## 2022-03-16 NOTE — Unmapped (Signed)
Please refill if appropriate.     Most Recent Clinic Visit:Visit date not found  Next Clinic Visit: 03/28/2022

## 2022-03-17 DIAGNOSIS — F5101 Primary insomnia: Principal | ICD-10-CM

## 2022-03-17 MED ORDER — TRAZODONE 50 MG TABLET
ORAL_TABLET | Freq: Every evening | ORAL | 1 refills | 30 days | Status: CP
Start: 2022-03-17 — End: ?

## 2022-03-18 ENCOUNTER — Ambulatory Visit: Admit: 2022-03-18 | Discharge: 2022-03-19 | Payer: MEDICARE

## 2022-03-18 DIAGNOSIS — E039 Hypothyroidism, unspecified: Principal | ICD-10-CM

## 2022-03-18 DIAGNOSIS — E876 Hypokalemia: Principal | ICD-10-CM

## 2022-03-18 DIAGNOSIS — R7989 Other specified abnormal findings of blood chemistry: Principal | ICD-10-CM

## 2022-03-18 DIAGNOSIS — C50919 Malignant neoplasm of unspecified site of unspecified female breast: Principal | ICD-10-CM

## 2022-03-18 DIAGNOSIS — R110 Nausea: Principal | ICD-10-CM

## 2022-03-18 DIAGNOSIS — C50811 Malignant neoplasm of overlapping sites of right female breast: Principal | ICD-10-CM

## 2022-03-18 DIAGNOSIS — K900 Celiac disease: Principal | ICD-10-CM

## 2022-03-18 LAB — MAGNESIUM: MAGNESIUM: 1.7 mg/dL (ref 1.6–2.6)

## 2022-03-18 LAB — COMPREHENSIVE METABOLIC PANEL
ALBUMIN: 4 g/dL (ref 3.4–5.0)
ALKALINE PHOSPHATASE: 179 U/L — ABNORMAL HIGH (ref 46–116)
ALT (SGPT): 13 U/L (ref 10–49)
ANION GAP: 5 mmol/L (ref 5–14)
AST (SGOT): 35 U/L — ABNORMAL HIGH (ref <=34)
BILIRUBIN TOTAL: 0.7 mg/dL (ref 0.3–1.2)
BLOOD UREA NITROGEN: 7 mg/dL — ABNORMAL LOW (ref 9–23)
BUN / CREAT RATIO: 11
CALCIUM: 9.6 mg/dL (ref 8.7–10.4)
CHLORIDE: 109 mmol/L — ABNORMAL HIGH (ref 98–107)
CO2: 27 mmol/L (ref 20.0–31.0)
CREATININE: 0.64 mg/dL
EGFR CKD-EPI (2021) FEMALE: >90 mL/min/{1.73_m2} (ref >=60)
GLUCOSE RANDOM: 81 mg/dL (ref 70–179)
POTASSIUM: 3.3 mmol/L — ABNORMAL LOW (ref 3.4–4.8)
PROTEIN TOTAL: 7.4 g/dL (ref 5.7–8.2)
SODIUM: 141 mmol/L (ref 135–145)

## 2022-03-18 LAB — CBC W/ AUTO DIFF
BASOPHILS ABSOLUTE COUNT: 0 10*9/L (ref 0.0–0.1)
BASOPHILS RELATIVE PERCENT: 0.6 %
EOSINOPHILS ABSOLUTE COUNT: 0.1 10*9/L (ref 0.0–0.5)
EOSINOPHILS RELATIVE PERCENT: 0.9 %
HEMATOCRIT: 42.1 % (ref 34.0–44.0)
HEMOGLOBIN: 14.8 g/dL (ref 11.3–14.9)
LYMPHOCYTES ABSOLUTE COUNT: 2.4 10*9/L (ref 1.1–3.6)
LYMPHOCYTES RELATIVE PERCENT: 37.6 %
MEAN CORPUSCULAR HEMOGLOBIN CONC: 35 g/dL (ref 32.0–36.0)
MEAN CORPUSCULAR HEMOGLOBIN: 39.8 pg — ABNORMAL HIGH (ref 25.9–32.4)
MEAN CORPUSCULAR VOLUME: 113.7 fL — ABNORMAL HIGH (ref 77.6–95.7)
MEAN PLATELET VOLUME: 7.5 fL (ref 6.8–10.7)
MONOCYTES ABSOLUTE COUNT: 0.6 10*9/L (ref 0.3–0.8)
MONOCYTES RELATIVE PERCENT: 9.2 %
NEUTROPHILS ABSOLUTE COUNT: 3.4 10*9/L (ref 1.8–7.8)
NEUTROPHILS RELATIVE PERCENT: 51.7 %
NUCLEATED RED BLOOD CELLS: 0 /100{WBCs} (ref <=4)
PLATELET COUNT: 154 10*9/L (ref 150–450)
RED BLOOD CELL COUNT: 3.7 10*12/L — ABNORMAL LOW (ref 3.95–5.13)
RED CELL DISTRIBUTION WIDTH: 14.4 % (ref 12.2–15.2)
WBC ADJUSTED: 6.5 10*9/L (ref 3.6–11.2)

## 2022-03-18 LAB — T4, FREE: FREE T4: 0.81 ng/dL — ABNORMAL LOW (ref 0.89–1.76)

## 2022-03-18 LAB — TSH: THYROID STIMULATING HORMONE: 7.102 u[IU]/mL — ABNORMAL HIGH (ref 0.550–4.780)

## 2022-03-18 MED ORDER — LEVOTHYROXINE 25 MCG TABLET
ORAL_TABLET | Freq: Every day | ORAL | 11 refills | 30 days | Status: CP
Start: 2022-03-18 — End: 2023-03-18

## 2022-03-18 MED ADMIN — heparin, porcine (PF) 100 unit/mL injection 500 Units: 500 [IU] | INTRAVENOUS | @ 19:00:00 | Stop: 2022-03-18

## 2022-03-18 MED ADMIN — trastuzumab (HERCEPTIN) 340 mg in sodium chloride (NS) 0.9 % 250 mL IVPB: 6 mg/kg | INTRAVENOUS | @ 19:00:00 | Stop: 2022-03-18

## 2022-03-18 MED ADMIN — sodium chloride (NS) 0.9 % infusion: 100 mL/h | INTRAVENOUS | @ 19:00:00 | Stop: 2022-03-18

## 2022-03-18 MED ADMIN — potassium chloride CR tablet 40 mEq: 40 meq | ORAL | @ 19:00:00 | Stop: 2022-03-18

## 2022-03-18 NOTE — Unmapped (Signed)
Patient arrived to infusion clinic at 1330  alert and oriented x 3.  Ambulated with steady gait. Current weight obtained and vital signs measured.  Pt reports she has not been eating well recently.  Friend agreed and asked about the pills that Dr. [name] gave her to help her eat.   I encouraged her to write a mychart message to current md to request.    Parameters for infusion in treatment plan reviewed and dose calculation using weight from today verified to be within 10% of current dose.   Magnesium WNL and did not require supplementation.  Pt given oral potassium per therapy plan.   Port to left chest accessed using sterile technique,CHG cleansing of insertion site for greater than 30 seconds.  20g 3/4 in huber needle.  Flushed easily with 10 ml normal saline using pulsatile method, blood return verified.  Clear dressing applied.  Diamond dressing.   At the end of the infusion pt started to complain about clear dressing starting to itch.   No redness noted.     Infusion completed   At the completion of the infusion port flushed with 10 ml normal saline using pulsatile method.  Good blood return noted. Flushed with Heparin per MD orders. Huber needle  removed. Bandaid applied to site.      Discharged ambulatory .Marland KitchenMarland Kitchen

## 2022-03-18 NOTE — Unmapped (Signed)
Infusion on 03/18/2022   Component Date Value Ref Range Status    Sodium 03/18/2022 141  135 - 145 mmol/L Final    Potassium 03/18/2022 3.3 (L)  3.4 - 4.8 mmol/L Final    Chloride 03/18/2022 109 (H)  98 - 107 mmol/L Final    CO2 03/18/2022 27.0  20.0 - 31.0 mmol/L Final    Anion Gap 03/18/2022 5  5 - 14 mmol/L Final    BUN 03/18/2022 7 (L)  9 - 23 mg/dL Final    Creatinine 33/29/5188 0.64  0.60 - 0.80 mg/dL Final    BUN/Creatinine Ratio 03/18/2022 11   Final    eGFR CKD-EPI (2021) Female 03/18/2022 >90  >=60 mL/min/1.33m2 Final    eGFR calculated with CKD-EPI 2021 equation in accordance with [employer] and [employer] of Nephrology Task Force recommendations.    Glucose 03/18/2022 81  70 - 179 mg/dL Final    Calcium 41/66/0630 9.6  8.7 - 10.4 mg/dL Final    Albumin 16/12/930 4.0  3.4 - 5.0 g/dL Final    Total Protein 03/18/2022 7.4  5.7 - 8.2 g/dL Final    Total Bilirubin 03/18/2022 0.7  0.3 - 1.2 mg/dL Final    AST 35/57/3220 35 (H)  <=34 U/L Final    ALT 03/18/2022 13  10 - 49 U/L Final    Alkaline Phosphatase 03/18/2022 179 (H)  46 - 116 U/L Final    Magnesium 03/18/2022 1.7  1.6 - 2.6 mg/dL Final    TSH 25/42/7062 7.102 (H)  0.550 - 4.780 uIU/mL Final    WBC 03/18/2022 6.5  3.6 - 11.2 10*9/L Final    RBC 03/18/2022 3.70 (L)  3.95 - 5.13 10*12/L Final    HGB 03/18/2022 14.8  11.3 - 14.9 g/dL Final    HCT 37/62/8315 42.1  34.0 - 44.0 % Final    MCV 03/18/2022 113.7 (H)  77.6 - 95.7 fL Final    MCH 03/18/2022 39.8 (H)  25.9 - 32.4 pg Final    MCHC 03/18/2022 35.0  32.0 - 36.0 g/dL Final    RDW 17/61/6073 14.4  12.2 - 15.2 % Final    MPV 03/18/2022 7.5  6.8 - 10.7 fL Final    Platelet 03/18/2022 154  150 - 450 10*9/L Final    nRBC 03/18/2022 0  <=4 /100 WBCs Final    Neutrophils % 03/18/2022 51.7  % Final    Lymphocytes % 03/18/2022 37.6  % Final    Monocytes % 03/18/2022 9.2  % Final    Eosinophils % 03/18/2022 0.9  % Final    Basophils % 03/18/2022 0.6  % Final    Absolute Neutrophils 03/18/2022 3.4  1.8 - 7.8 10*9/L Final    Absolute Lymphocytes 03/18/2022 2.4  1.1 - 3.6 10*9/L Final    Absolute Monocytes 03/18/2022 0.6  0.3 - 0.8 10*9/L Final    Absolute Eosinophils 03/18/2022 0.1  0.0 - 0.5 10*9/L Final    Absolute Basophils 03/18/2022 0.0  0.0 - 0.1 10*9/L Final    Macrocytosis 03/18/2022 Moderate (A)  Not Present Final    Free T4 03/18/2022 0.81 (L)  0.89 - 1.76 ng/dL Final

## 2022-03-18 NOTE — Unmapped (Signed)
TSH elevated, FT4 low.  Will start on low dose levothyroxine 25 mcg daily in the morning.  Mychart message sent.  Labs added to recheck in 4 - 6 weeks.

## 2022-03-21 DIAGNOSIS — R634 Abnormal weight loss: Principal | ICD-10-CM

## 2022-03-21 MED ORDER — MIRTAZAPINE 7.5 MG TABLET
ORAL_TABLET | Freq: Every evening | ORAL | 0 refills | 30 days | Status: CP
Start: 2022-03-21 — End: ?

## 2022-03-21 NOTE — Unmapped (Signed)
Left message for [name], patient's sister, to review several myChart messages from her oncology team that they have not read just yet.  The patient's thyroid levels were low so the team would like her to begin levothyroxine 25 mcg daily in the morning 30-60 minutes prior to eating breakfast.  This may help with her energy.  Furthermore, her palliative care team was consulted regarding an appetite stimulant and she refilled the patient's mirtazapine (patient had stopped this previously thinking it was only a sleep aid). Both of these medications were sent to their [employer].

## 2022-03-22 MED FILL — CAPECITABINE 500 MG TABLET: ORAL | 30 days supply | Qty: 60 | Fill #1

## 2022-03-22 MED FILL — TUKYSA 50 MG TABLET: 30 days supply | Qty: 120 | Fill #2

## 2022-03-24 ENCOUNTER — Telehealth
Admit: 2022-03-24 | Payer: MEDICARE | Attending: Student in an Organized Health Care Education/Training Program | Primary: Student in an Organized Health Care Education/Training Program

## 2022-03-24 NOTE — Unmapped (Signed)
Comprehensive Cancer Support Program (CCSP) - Psychiatry Outpatient Clinic     After Visit Summary    It was a pleasure to see you today in the [facility]???s Comprehensive Cancer Support Program (CCSP). The CCSP is a multidisciplinary program dedicated to helping patients, caregivers, and families with cancer treatment, recovery and survivorship.      To learn more about the CCSP  CCSP Website:  [email or web address]    Call or stop by the [facility]  Location: Ground floor, [facility]  Phone: [phone]    To schedule, cancel, or change your appointment  Please message your provider via MyChart. You can also call the CCSP coordinator, Myrene Galas, at [phone].     For after hours urgent issues, you may call [phone] or call the I need to talk line at [phone] [phone] anytime 24/7.    For prescription refills  Please allow at least 24 hours (during business hours, M-F). We are generally unable to accommodate same-day requests for refills.     If you are taking any controlled substances (such as anxiety or sleep medications), you must use them as the directions say to use them. We generally do not provide early refills over the phone without clear reason, and it would be inappropriate to obtain the medications from other doctors. We routinely use the [place] controlled substance database to monitor prescription drug use.

## 2022-03-24 NOTE — Unmapped (Incomplete)
[facility]  Comprehensive Cancer Support Program/Psychiatry   Established Patient E&M Service - Outpatient     Encounter Description: video (see telehealth disclaimer at bottom of note)    Name: Brooke [name]  Date: 03/24/2022  MRN: [id number]  DOB: [birth year]    Assessment:  [name] presents for follow-up evaluation for depression and anxiety.    In the interim since last appointment, she has had another MRI brain, CT and bone scan showing no evidence of progression, and she has had potassium increased and tucatinib decreased (both related to chronic diarrhea). She continues on Xeloda and Herceptin as well (fourth line).       *** no change, using less clonazepam    She repots stable mood and appetite but some increased stress due to taking care of sister after a bad infection, and this has lead to every day use of clonazepam. She asks about what medications could be affecting her memory, and we discuss potential contributions of clonazepam, opioids, and daily cannabis. She is still on a very low dose of clonazepam, and we will plan to have another visit in one month to assess the pattern of its use, consider alternate therapies, and reassess cognition/memory. We will not make medication changes today.     Identifying Information:  Brooke [name] is a 66 y.o. 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 Last treatment in 2020 (radiation to right frontal brain met post-op bed). She continues on capecitabine (Xeloda, pyrimidine analog) 500mg  BID, tucatinib (anti HER-2)150mg  BID, trastuzumab (Herceptin, anti-HER2) q3 weeks.    Risk Assessment:  An assessment of suicide and violence risk factors was performed as part of this evaluation and is not significantly changed from the last visit.While future psychiatric events cannot be accurately predicted, the patient does not currently require acute inpatient psychiatric care and does not currently meet [facility] involuntary commitment criteria.      Plan:  Problem 1: Major depressive disorder, recurrent episode   Status of problem: chronic and stable  Interventions:   - Continue Effexor XR 300mg  PO daily to target mood, anxiety, and hot flashes.    - Previously established with [facility] psychotherapist [facility], not currently in therapy    Problem 2: Unspecified anxiety disorder  Status of problem: chronic and stable  Interventions:   - Continue Klonopin 0.5mg  qday PRN anxiety, last filled Nov 22. She has historically used this medication very sparingly (reportedly about 2x/week) but recently using nightly.   - Discussed risk/benefit again with her 02/03/21, in particular focusing on potential of benzodiazepines to cause or worsen cognitive deficits. Previously strongly discouraged her from taking klonopin unless absolutely necessary for intense anxiety    Problem 3: Insomnia  Status of problem: chronic and stable  Interventions:   - Continue Trazodone 75 mg nightly prn insomnia    Problem 4: Potential Sleep Apnea  Status of problem: Stable  Interventions:  - Ambulatory referral to Sleep center was placed in 10/2020 but she declined    Problem 5: Mild Neurocognitive Disorder  Status of problem: stable. MOCA 20/30 on 02/03/21  Interventions:  - Continue to monitor, patient noticing memory deficits like forgetting date, forgetting exactly where she is going. Denies leaving stove on or getting lost.  - Recommended pill organizer, alarms on phone and schedule/spreadsheet where she can mark  doses once she has taken them    Follow-up: 3/9 at 3:30, video visit      Psychotherapy provided:  No billable psychotherapy service provided.    Patient has been given this writer's contact information as well as the [facility] Psychiatry urgent line number. The patient has been instructed to call 911 for emergencies.    Patient was seen and plan of care was discussed with the Attending MD,Dr. Lawernce Keas, who agrees with the above statement and plan.   Maralyn Sago, MD      Subjective:    Chief complaint:  Follow-up psychiatric evaluation for anxiety and depression    Interval History:     Sister had operation for her heart, told her she had diastolic pulmonary issues. Doing better, and she is still helping her. Her thyroid was low and they put her on medicines. Still low appetite but taking medicines for that (mirtazapine). Still losing weight, now 108 pounds. Still has thoughts about food textures being disgusting or too dry. Nutritionist talked to her about another drink to try, Body Armour, which she is trying    Mood is okay but that's because she sleeps all day. Wakes up 4am, makes coffee, back to sleep if neighbor above her isn't too loud, but otherwise by lunchtime she feels very tired, not motivated to cook or do dishes. Pays bills and gets food but not much extra money left to do fun things. Eggs and bacon appetizing, her doctor said she can eat whatever she wants but she has been worried about her cholesterol    Effexor: loves it. Doesn't want to change it, 2 pills (300mg  total) in the morning. Sets out medicine each night and takes it with milk. Enables her to sleep without worrying about things, helps with night sweats. Staying even-keeled, not getting angry or frustrated with anyone    Trazodone: 75mg  nightly, currently not using  Clonazepam: every other day    No SI, says she couldn't do that to her son and God doesn't want her to do that. Depression for her is not eating enough or eating too much, sadness and crying in bed, racing thoughts. Right now her symptoms feel separate. She does get worried her sister's are bailing on her, [name]'s husband isn't doing well. [name] takes her to infusions    Alcohol: quit but very rarely glass of wine with dinner. Had alcohol on Christmas or New Years  Marijuana: smokes a little less than one bowl every night, relaxing and helps with pain. Smoked since 66 years old  Tobacco: half a pack/day, down from 0.75 pack    Objective:    Mental Status Exam:  Appearance:    Appears stated age, Clean/Neat and wearing make-up and clean matching outfit   Motor:   No abnormal movements   Speech/Language:    Normal rate, volume, tone, fluency and Language intact, well formed   Mood:   good   Affect:   Calm, Cooperative and Euthymic, can laugh   Thought process and Associations:   Mostly logical, linear, clear, coherent, goal directed. At times gives answer only tangentially related to question, sometimes concrete   Abnormal/psychotic thought content:     Denied SI, denied HI. No evidence of paranoia or delusions.   Perceptual disturbances:     No AVH     Memory/recall  Historically poor   Attention  Able to concentrate and attend to conversation but not formally tested   Orientation  Did not formally assess but  seems grossly oriented   Other:   Insight and judgment fair.  Fair impulse control.     {    Coding tips - Do not edit this text, it will delete upon signing of note!    ?? Telephone visits [phone] for Physicians and APP??s and [phone] for Non- Physician Clinicians)- Only use minutes on the phone to determine level of service.    ?? Video visits [phone]) - Use both minutes on video and pre/post minutes to determine level of service.       :[id number]}    The patient reports they are currently: {patient location:81390}. I spent *** minutes on the {phone audio video visit:67489} with the patient on the date of service. I spent an additional *** minutes on pre- and post-visit activities on the date of service.     The patient was physically located in [place] or a state in which I am permitted to provide care. The patient and/or parent/guardian understood that s/he may incur co-pays and cost sharing, and agreed to the telemedicine visit. The visit was reasonable and appropriate under the circumstances given the patient's presentation at the time.    The patient and/or parent/guardian has been advised of the potential risks and limitations of this mode of treatment (including, but not limited to, the absence of in-person examination) and has agreed to be treated using telemedicine. The patient's/patient's family's questions regarding telemedicine have been answered.     If the visit was completed in an ambulatory setting, the patient and/or parent/guardian has also been advised to contact their provider???s office for worsening conditions, and seek emergency medical treatment and/or call 911 if the patient deems either necessary.          Maralyn Sago, MD

## 2022-03-28 ENCOUNTER — Telehealth: Admit: 2022-03-28 | Discharge: 2022-03-29 | Payer: MEDICARE

## 2022-03-28 DIAGNOSIS — G629 Polyneuropathy, unspecified: Principal | ICD-10-CM

## 2022-03-28 DIAGNOSIS — M79622 Pain in left upper arm: Principal | ICD-10-CM

## 2022-03-28 DIAGNOSIS — R634 Abnormal weight loss: Principal | ICD-10-CM

## 2022-03-28 MED ORDER — HYDROCODONE 10 MG-ACETAMINOPHEN 325 MG TABLET
ORAL_TABLET | Freq: Two times a day (BID) | ORAL | 0 refills | 30 days | Status: CP | PRN
Start: 2022-03-28 — End: ?

## 2022-03-28 MED ORDER — MIRTAZAPINE 7.5 MG TABLET
ORAL_TABLET | Freq: Every evening | ORAL | 0 refills | 30 days | Status: CP
Start: 2022-03-28 — End: ?

## 2022-03-28 NOTE — Unmapped (Signed)
OUTPATIENT ONCOLOGY PALLIATIVE CARE    Principal Diagnosis: [name] is a 66 y.o. female with metastatic breast cancer,  diagnosed in 2017.  Disease sites include lung and brain.     Assessment/Plan:   1.  New upper left arm pain and planning on having 4-5 teeth removed in the next 2-3 wks.  Flare of neuropathic pain in legs and feet. Hx of mid upper back pain-no pain presently.  Hx of cervical thoracic paraspinal trigger point bilateral this past July 2022 with not as much relief as she has had in the past.     -Ordered left arm xray for 4/14 visit  -Ok to take IBU 600 mg 2x/day with food for after the teeth extraction  -Continue lyrica 200 mg bid dosing  -Continue hydrocodone 10/325 mg 1 tab-3 times a day as needed for pain. Rare use recently  -Continue Effexor 225 mg every day  -Continue diclofenac gel as needed  -Will connect with Brooke Lai RN to order physical therapy in [place] in [place] has gone there before for her neuropathic pain.  -Receptive to seeing Brooke [name] for neuropathic pain.    Opioid use in past:  -Had difficulty tolerating the methadone 2.5 mg dosing due to sedation.  Did not try the 1 mg methadone, so could consider this in the future if needed for the neuropathic discomfort.  Some reluctance in re-starting methadone.  -No relief with tramadol  -oxycodone-night terrors.  -Suboxone-hallucinations    2. Wgt loss/anorexia-some improvement with remeron and synthroid  -Continue remeron 7.5 mg at bedtime    3. Mood-good.   -Continue with effexor, trazadone and clonazepam.     4. Goals of care-not addressed today.     At prior visits: goals which are to decrease pain and to maintain her independence. Lives alone and Brooke [name] lives close by.     Wants to ensure that her quality of life remains high and the wish to keep on going.  Shared concern that she does not want to be in pain.  NP provided reassurance that our team can offer her different treatment strategies to help her with her goal.     Advance care planning-see advance care planning note dated 01/31/2020.  -at prior visits: Patient shared that she has been dreaming about death. She does have her funeral arrangements completed. Shared that she has her wishes written down and her Sister Brooke [name] knows where they are regarding her funeral. She still contemplating whether to be cremated or buried and she is leaning toward cremation. patient currently focused on cancer directed therapy.  Prefers to stay in the moment and not discussed things.  We will continue to support and address if she has a change in clinical condition.        -Advance directives scanned in on 11/02/19        HCDM Brooke [name]): Brooke [name] - Sister - [phone]    HCDM, First AlternateJaneece [name] - Sister - [phone]    At prior visits,   # Controlled substances risk management.  We are not currently prescribing controlled medications for her.   - Patient has a signed pain medication agreement with Outpt Palliative care, completed on 11/01/18, as per standard care. This was signed again on 01/28/19   - [place] database was reviewed today and it was appropriate.   - Urine drug screen was not performed at this visit. Findings: not applicable.   - Patient has received information about safe storage and administration of medications.   -  Patient has received a prescription for narcan and shared with Brooke [name] and pt.       F/u: 2 months video    ----------------------------------------  Referring Provider: From inpatient oncology team  Oncology Team: Breast team-Dr. [name]  PCP: Brooke Milliner, MD      HPI: 66 year old woman with her to overexpressing metastatic breast cancer to her lung and brain.  Was found to have multiple small brain metastases and completed CyberKnife therapy on September 11. Describes ongoing pain in her pelvis, rates it as a 3 out of 10 today.  Has been improving over the last week or 2.  Feels like gabapentin has been helpful, and is also responded well to Tylenol and ibuprofen in the past.  She is taken oxycodone and it gave her night terrors does not want to take it again.  Has taken Dilaudid previously and did not have side effects from this.    Current cancer-directed therapy: capecitabine (Xeloda), tucatinib, and trastuzumab (Herceptin).     Diagnosis of COVID (07/18/21)      Interval hx 03/28/22 [facility] and [name]    -Planning on spending the next following Mondays at the dental clinic.  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  Plan is to get a new upper partial fitted.  Believes she is going to have to have 4-5 teeth removed the next 2 to 3 weeks.      Symptom Review:  General: ok  Pain: New onset of left upper arm severe pain.  No recent falls.  Shares she feels like her left arm is broken.  Fortunately no back pain and attributes this to not being as active with the cooking and taking a slower time doing the laundry.  Slight flare in neuropathic pain in legs. Really has not been taking much Norco.  Not taking the ibuprofen or diclofenac.  Interested in going back to physical therapy in [place] and also seeing Brooke [name] for her neuropathic discomfort.  Fatigue: Overall not so good.  Finds that she is sleeping a lot.  Mobility: Does not use devices.  Appetite: Slight improvement with Remeron and now on Synthroid.  Breakfast are very good meals for her and eats a really good breakfast wakes up hungry.  Lunch intake is okay.  Likes the bodyarmor drinks overall.  Really does not eat much for dinner if at all.  Bowel function: Finally no diarrhea.  Stools are formed.  No constipation.  Mood: Pretty good.  Looking forward to the Easter holiday.  Shares she has her [employer] outfit already picked out and has offered her family to accompany her on Easter services.  No one is able to go and [place] is okay with this.      Palliative Performance Scale: 70% - Ambulation: Reduced / unable to do normal work, some evidence of disease / Self-Care: Full / Intake: Normal or reduced / Level of Conscious: Full         Coping/Support Issues: Patient reports she is been able to attend church functions and is finding this very helpful. Has boyfriend Brooke [name] for the last 15 years.     At prior visits, patient did share that she continues to receive much help from her church friend, [name] and her husband.  They prayed together and she is found this supportive.  She states that her 2 sisters have been supportive, but she feels that they are becoming more less patient with her due to patient's irritability.  Overall coping well, no specific issues identified.  Finding support with her church community, her sister, Brooke [name], and prayer.         Social History: Pt lives in senior housing in [place] month, says it's ok and quiet but she is from [place] and liked it there more. She has 4 sisters, 2 nearby and involved in care. She has 1 son-[name], 35yo, who lives about an hour and a half away in [place]. She is divorced.     Goes to [employer] in [place] and gets a lot of support. Her church friend [name] goes with her to chemo appts. Her sisters will go too.    Advance Care Planning: Advance directives scanned into system  HCPOA: See ACP note  Living Will: See ACP note  ACP note: Yes, advance directive scanned in on November 6    Objective     Opioid Risk Tool:      Opioid Risk Tool:   Female  Female    Family history of substance abuse      Alcohol   1  3    Illegal drugs  2  3    Rx drugs  4  4    Personal history of substance abuse      Alcohol  3  3    Illegal drugs  4  4    Rx drugs  5  5    Age between 16--45 years  1  1    History of preadolescent sexual abuse  3 0    Psychological disease      ADD, OCD, bipolar, schizophrenia  2  2    Depression  1  1    Total: 7  (<3 low risk, 4-7 moderate risk, >8 high risk)      Oncology History Overview Note   Identifying Statement:  Lowen Mansouri is a 66 y.o. female diagnosed with a right posterior frontal lobe metastasis (breast primary) status post resection 01/15/2019 and 5/5 fractions of SBRT (2500 Gy via CyberKnife) to the resection cavity.    Treatment History:  09/05/17: S/p SRS to 7 lesions  01/15/19: S/p resection, followed by SRS 2500 cGy   03/04/19: KPS 80; MRI with postsurgical changes versus residual disease; RTC in 2 weeks w/ MRI   04/15/19: KPS NA; MRI w/ SD; no study; RTC 6 weeks w/ MRI   06/10/19: KPS 80; MRI w/ SD; RTC 4 weeks  07/15/19: KPS 80; MRI w/ SD; RTC 8 weeks  08/19/19: KPS 80; start nortriptyline for HAs; RTC 4 weeks w/ MRI  09/16/19: KPS 80; con't nortriptyline for HAs; RTC 2 mos w/ MRI  11/11/19: KPS 80; MRI w/ SD though w/ small infarct; proceed w/ CVA workup; start 81mg  ASA; encouraged smoking cessation; con't nortriptyline; RTC to discuss results  11/25/19: KPS 80; discussed CVA workup results; MRA unremarkable; con't smoking cessation efforts; con't nortriptyline for HAs; con't 81mg  ASA, start Lipitor; f/u with PCP for COPD eval; RTC 2 mos w/ MRI     Malignant neoplasm of overlapping sites of right female breast (CMS-HCC)   2017 -  Presenting Symptoms    Large open wound RT breast which began as nipple inversion. Patient states that she ignored for a long time.  Physical exam of the area of concern in the RTbreast demonstrates a large open wound in the lateral right breast. Saw PCP 01/27/16 Rx Keflex.     02/04/2016 Interval Scan(s)    MMG/US: 3.1 (3.0) cm  irregular spiculated dense mass in lateral Rt breast w nipple and skin retraction. 2 subcentimeter irregular masses in the RUOQ  (10', 11') (possible satellite lesions). Large Rt axillary lymph node 1.7. Lt breast clear. BIRAD 5.     02/08/2016 Biopsy    US guided Rt. Axillary LN biopsy:  Gr 3, IDC with apocrine features. ER (-), PR (31-40), HER-2 (3+). Noted to have multiple skin lesions. Poor access to breast mass due open 10-15 cm wound risk of pain and bleeding.     02/09/2016 Initial Diagnosis    Malignant neoplasm of overlapping sites of right female breast (RAF-HCC)     02/10/2016 Interval Scan(s)    CT CAP: Large, necrotic right breast mass with wide open tract to the skin, consistent with known right breast malignancy.  Numerous enlarged right axillary, subpectoral, mediastinal, bilateral hilar lymph nodes and innumerable bilateral pulmonary nodules     02/10/2016 Interval Scan(s)    NM Bone scan: No osseous metastatic disease.     04/28/2016 - 03/10/2020 Chemotherapy    OP TRASTUZUMAB (EVERY 21 DAYS)  Trastuzumab 8 mg/kg loading then 6 mg/kg every 21 days       07/13/2016 Genetics    STRATA   ERBB2 copy number alteration  Estimated copy number: 40, confidence interval: 35.7 - 44.0, cellularity: 50%  Associated FDA-approved targeted therapies in breast cancer: trastuzumab, lapatinib, ado-trastuzumab emtansine, pertuzumab   PIK3CA p.E545A     01/03/2020 -  Cancer Staged    CT CAP 01/03/2020 which showed a new lingular opacity measuring 1.9 cm in the right lung. There was also slight increase in the right breast mass from 1.2-> 1.5 cm.  Bone scan shows no osseous metastases. Overall I considered these findings indeterminate and ppted to repeat CT CAP in 1 month     02/03/2020 -  Cancer Staged    CT chest 02/03/20 which showed that the lingular lesion has persisted and increased in size.  No mediastinal adenopathy reported.  She continues to report a nonproductive cough and mild dyspnea.  She is a chronic smoker and currently smokes on average half a pack per day.  My major concern is that this could be a second primary lung cancer as her other sites of diseases stable on current systemic therapy       02/03/2020 Endocrine/Hormone Therapy    Stop Tamoxifen, Add Fasoldex, continue Q3week Hercpetin     03/23/2020 - 02/09/2021 Chemotherapy    OP BREAST ADO-TRASTUZUMAB EMTANSINE  ado-trastuzumab emtansine 3.6 mg/kg IV on day 1, every 21 days       04/12/2021 -  Chemotherapy    Tucatinib + Capecitabine  OP TRASTUZUMAB (EVERY 21 DAYS)  trastuzumab 8 mg/kg IV LOADING, then 6 mg/kg IV MAINT, every 21 days       Brain metastases   08/07/2017 Initial Diagnosis    Brain metastases (CMS-HCC)    Summary of Radiosurgery   Rx:7 brain met: 09/05/2017: 2,000/2,000 cGy  Sec:R Frontal: 09/05/2017: 2,000 cGy  Sec:L Post Fr: 09/05/2017: 2,000 cGy  Sec:L Ant Fro: 09/05/2017: 2,000 cGy  Sec:R Sup Oc: 09/05/2017: 2,000 cGy  Sec:L Occipita: 09/05/2017: 2,000 cGy  Sec:R Inf Occi: 09/05/2017: 2,000 cGy  Sec:L Tempor: 09/05/2017: 2,000 cGy  Rx:Lt Med Oc: 09/13/2018: 2,000/2,000 cGy  Rx:Rt Frontal: : 2,500/2,500 cGy    01/15/2019: Craniotomy and resection of right posterior frontal lobe metastasis. Followed by [facility]    03/04/19: KPS 80; MRI with postsurgical changes versus residual disease; RTC in 2 weeks w/ MRI  01/03/2020 -  Cancer Staged    CT CAP 01/03/2020 which showed a new lingular opacity measuring 1.9 cm in the right lung. There was also slight increase in the right breast mass from 1.2-> 1.5 cm.  Bone scan shows no osseous metastases. Overall I considered these findings indeterminate and ppted to repeat CT CAP in 1 month     02/03/2020 -  Cancer Staged    CT chest 02/03/20 which showed that the lingular lesion has persisted and increased in size.  No mediastinal adenopathy reported.  She continues to report a nonproductive cough and mild dyspnea.  She is a chronic smoker and currently smokes on average half a pack per day.  My major concern is that this could be a second primary lung cancer as her other sites of diseases stable on current systemic therapy       02/03/2020 Endocrine/Hormone Therapy    Stop Tamoxifen, Add Fasoldex, continue Q3week Hercpetin     Metastatic breast cancer   01/25/2019 Initial Diagnosis    Metastatic breast cancer (CMS-HCC)     03/23/2020 - 02/09/2021 Chemotherapy    OP BREAST ADO-TRASTUZUMAB EMTANSINE  ado-trastuzumab emtansine 3.6 mg/kg IV on day 1, every 21 days       04/12/2021 -  Chemotherapy    Tucatinib + Capecitabine  OP TRASTUZUMAB (EVERY 21 DAYS)  trastuzumab 8 mg/kg IV LOADING, then 6 mg/kg IV MAINT, every 21 days       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)   05/07/2021 Initial Diagnosis    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)     02/24/2022 -  Cancer Staged    Staging form: Lung, AJCC 8th Edition  - Clinical: Stage IV (pM1) - Signed by Donzetta Kohut, PA on 02/24/2022             Patient Active Problem List   Diagnosis    Malignant neoplasm of overlapping sites of right female breast (CMS-HCC)    Peripheral neuropathy    Insomnia    Brain metastases    Nausea    Closed fracture of one rib with routine healing    Diarrhea of presumed infectious origin    Failure to thrive in adult    Tobacco use    Chronic diarrhea    Depression    Hyponatremia    Back pain    Closed fracture of multiple pubic rami, right, initial encounter (CMS-HCC)    Macrocytosis    Pelvic fracture (CMS-HCC)    Metastatic breast cancer    Migraine without aura and without status migrainosus, not intractable    Essential hypertension    CVA (cerebral vascular accident) (CMS-HCC)    DDD (degenerative disc disease), cervical    Wheezing    Dyslipidemia    Angina pectoris (CMS-HCC)    Papillary fibroelastoma of heart    Antineoplastic chemotherapy induced anemia    Celiac disease    Iron deficiency anemia due to chronic blood loss    Dry eye syndrome, bilateral    Meibomian gland dysfunction (MGD) of both eyes    Glaucoma suspect of both eyes    Incipient cataract of both eyes    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)    Fatty liver    Hypokalemia    Hypothyroidism, unspecified type    Elevated TSH       Past Medical History:   Diagnosis Date    Abnormal ECG unsure    Alcoholism (CMS-HCC)  Allergic rhinitis     Anemia     Anxiety     insomnia    Arthritis not sure    Brain concussion     Breast cancer (CMS-HCC)     chemo for now with mets    COPD (chronic obstructive pulmonary disease) (CMS-HCC)     Depression     Dysphagia     Fatty liver 07/08/2021    Genitourinary disease     GERD (gastroesophageal reflux disease)     Headache     HTN (hypertension)     Hyperlipidemia     Hypothyroidism, unspecified type 01/21/2022    Insomnia     Peripheral neuropathy     due to chemo therapy    Stroke (CMS-HCC) Nov. 2020    Dr. Theodoro Kalata    Tobacco dependence        Past Surgical History:   Procedure Laterality Date    CESAREAN SECTION      CHOLECYSTECTOMY      FINGER SURGERY Right     trigger finger    GANGLION CYST EXCISION Left     hand    HYSTERECTOMY      LYMPH NODE BIOPSY Right 02/08/2016    Axillary    PR BRNSCHSC TNDSC EBUS DX/TX INTERVENTION PERPH LES Left 03/05/2020    Procedure: Bronch, Rigid Or Flexible, Including Fluoro Guidance, When Performed; With Transendoscopic Ebus During Bronchoscopic Diagnostic Or Therapeutic Intervention(S) For Peripheral Lesion(S);  Surgeon: Jerelyn Charles, MD;  Location: [facility] OR [facility];  Service: Pulmonary    PR BRONCHOSCOPY,COMPUTER ASSIST/IMAGE-GUIDED NAVIGATION Left 03/05/2020    Procedure: BRONCHOSCOPY, RIGID OR FLEXIBLE, INCLUDE FLUORO WHEN PERFORMED; W/COMPUTER-ASSIST, IMAGE-GUIDED NAVIGATION;  Surgeon: Jerelyn Charles, MD;  Location: MAIN OR [facility];  Service: Pulmonary    PR BRONCHOSCOPY,DIAGNOSTIC W LAVAGE Left 03/05/2020    Procedure: Bronchoscopy, Rigid Or Flexible, Include Fluoroscopic Guidance When Performed; W/Bronchial Alveolar Lavage;  Surgeon: Jerelyn Charles, MD;  Location: MAIN OR [place];  Service: Pulmonary    PR BRONCHOSCOPY,TRANSBRON ASPIR BX Left 03/05/2020    Procedure: Bronchoscopy, Rigid/Flex, Incl Fluoro; W/Transbronch Ndl Aspirat Bx, Trachea, Main Stem &/Or Lobar Bronchus;  Surgeon: Jerelyn Charles, MD;  Location: MAIN OR [place];  Service: Pulmonary    PR BRONCHOSCOPY,TRANSBRONCH BIOPSY Left 03/05/2020    Procedure: Bronchoscopy, Rigid/Flexible, Include Fluoro Guidance When Performed; W/Transbronchial Lung Bx, Single Lobe;  Surgeon: Jerelyn Charles, MD;  Location: MAIN OR [name];  Service: Pulmonary    PR CATH PLACE/CORON ANGIO, IMG SUPER/INTERP,W LEFT HEART VENTRICULOGRAPHY N/A 05/07/2020    Procedure: Left Heart Catheterization;  Surgeon: Lesle Reek, MD;  Location: [name];  Service: Cardiology    PR COLONOSCOPY W/BIOPSY SINGLE/MULTIPLE N/A 10/21/2016    Procedure: COLONOSCOPY, FLEXIBLE, PROXIMAL TO SPLENIC FLEXURE; WITH BIOPSY, SINGLE OR MULTIPLE;  Surgeon: Monte Fantasia, MD;  Location: GI PROCEDURES [facility];  Service: Gastroenterology    PR EXCIS SUPRATENT BRAIN TUMOR Right 01/15/2019    Procedure: CRANIECTOMY; EXC BRAIN TUMOR-SUPRATENTORIAL;  Surgeon: Edison Simon, MD;  Location: MAIN OR [name];  Service: Neurosurgery    PR INCISE FINGER TENDON SHEATH Left 06/17/2019    Procedure: R-20 TENDON SHEATH INCISION (EG, FOR TRIGGER FINGER);  Surgeon: Daisy Lazar, MD;  Location: ASC OR [facility];  Service: Orthopedics    PR MICROSURG TECHNIQUES,REQ OPER MICROSCOPE Right 01/15/2019    Procedure: MICROSURGICAL TECHNIQUES, REQUIRING USE OF OPERATING MICROSCOPE (LIST SEPARATELY IN ADDITION TO CODE FOR PRIMARY PROCEDURE);  Surgeon: Edison Simon, MD;  Location:  MAIN OR [facility];  Service: Neurosurgery    PR STEREOTACTIC COMP ASSIST PROC,CRANIAL,INTRADURAL Right 01/15/2019    Procedure: STEREOTACTIC COMPUTER-ASSISTED (NAVIGATIONAL) PROCEDURE; CRANIAL, INTRADURAL;  Surgeon: Edison Simon, MD;  Location: MAIN OR [facility];  Service: Neurosurgery    PR UPPER GI ENDOSCOPY,BIOPSY N/A 10/21/2016    Procedure: UGI ENDOSCOPY; WITH BIOPSY, SINGLE OR MULTIPLE;  Surgeon: Monte Fantasia, MD;  Location: [name];  Service: Gastroenterology    PR UPPER GI ENDOSCOPY,BIOPSY N/A 04/02/2018    Procedure: UGI ENDOSCOPY; WITH BIOPSY, SINGLE OR MULTIPLE;  Surgeon: Wendall Papa, MD;  [facility]: [facility];  Service: Gastroenterology    SKIN BIOPSY         Current Outpatient Medications   Medication Sig Dispense Refill    albuterol HFA 90 mcg/actuation inhaler Inhale 2 puffs every six (6) hours as needed for wheezing.      aluminum-magnesium hydroxide-simethicone 200-200-20 mg/5 mL Susp 80 mL, diphenhydrAMINE 12.5 mg/5 mL Liqd 200 mg, nystatin 100,000 unit/mL Susp 8,000,000 Units, distilled water Liqd 80 mL, lidocaine 2% viscous 2 % Soln 80 mL Take 5 mL by mouth every six (6) hours as needed. Swish, gargle, spit or swallow if throat issues 80 mL 2    capecitabine (XELODA) 500 MG tablet Take 1 tablet (500 mg total) by mouth two (2) times a day, continuously. To be taken every day with no breaks 60 tablet 5    clobetasoL (TEMOVATE) 0.05 % ointment Apply twice a day to rash on palm until smooth/clear. 30 g 1    clonazePAM (KLONOPIN) 0.5 MG tablet Take 0.5 mg daily as needed for anxiety.  Do not exceed 0.5 mg/day. 30 tablet 0    diclofenac sodium (VOLTAREN) 1 % gel Apply 2 g topically four (4) times a day as needed for arthritis or pain. (Patient not taking: Reported on 02/25/2022) 150 g 2    diphenoxylate-atropine (LOMOTIL) 2.5-0.025 mg per tablet Take 1 tablet by mouth four (4) times a day as needed for diarrhea. 30 tablet 1    esomeprazole (NEXIUM) 40 MG capsule Take 1 capsule (40 mg total) by mouth in the morning. 90 capsule 3    FARXIGA 5 mg Tab tablet Take 5 mg by mouth daily.      HYDROcodone-acetaminophen (NORCO 10-325) 10-325 mg per tablet Take 1 tablet by mouth every twelve (12) hours as needed for pain. 60 tablet 0    levothyroxine (SYNTHROID) 25 MCG tablet Take 1 tablet (25 mcg total) by mouth daily. 30 tablet 11    lidocaine (LIDODERM) 5 % patch Place 1 patch on the skin daily. Apply to affected area for 12 hours only each day (then remove patch) 30 patch 3    lisinopriL-hydrochlorothiazide (PRINZIDE,ZESTORETIC) 20-25 mg per tablet Take 1 tablet by mouth daily. 90 tablet 3    MAGNESIUM ORAL Take by mouth daily. OTC (Patient not taking: Reported on 02/04/2022)      mirtazapine (REMERON) 7.5 MG tablet Take 1 tablet (7.5 mg total) by mouth nightly. 30 tablet 0    naloxone (NARCAN) 4 mg nasal spray One spray in either nostril once for known/suspected opioid overdose. May repeat every 2-3 minutes in alternating nostril til EMS arrives (Patient not taking: Reported on 11/11/2021) 2 each 0    ondansetron (ZOFRAN) 4 MG tablet Take 1 tablet (4 mg total) by mouth every six (6) hours as needed for nausea. 30 tablet 1    potassium chloride (KLOR-CON) 20 mEq packet Take 1 packet (20 mEq  total) by mouth Two (2) times a day. 60 packet 0    pregabalin (LYRICA) 200 MG capsule Take 1 capsule (200 mg total) by mouth Two (2) times a day. 60 capsule 0    prochlorperazine (COMPAZINE) 10 MG tablet Take 1 tablet (10 mg total) by mouth every six (6) hours as needed for nausea. 30 tablet 6    senna (SENOKOT) 8.6 mg tablet Take 3 tablets by mouth in the morning. 30 tablet 2    thiamine (VITAMIN B-1) 50 MG tablet Take 1 tablet (50 mg total) by mouth in the morning. (Patient not taking: Reported on 02/04/2022) 90 tablet 1    traZODone (DESYREL) 50 MG tablet Take 1.5 tablets (75 mg total) by mouth nightly. 45 tablet 1    tucatinib (TUKYSA) 150 mg tablet Take 1 tablet (150 mg total) by mouth two (2) times a day. 60 tablet 1    tucatinib (TUKYSA) 50 mg tablet Take 2 tablets (100 mg total) by mouth two (2) times a day . 120 tablet 3    venlafaxine (EFFEXOR-XR) 150 MG 24 hr capsule Take 2 capsules (300 mg total) by mouth daily. 60 capsule 5     No current facility-administered medications for this visit.       Allergies:   Allergies   Allergen Reactions    Adhesive Rash    Decadron [Dexamethasone] Anxiety     Mania as well    Suboxone [Buprenorphine-Naloxone] Hallucinations    Tetracycline      Other reaction(s): Other (See Comments)    Oxycodone      Night terrors    Skin Protectants, Misc. Rash    Tegaderm Adhesive-No Drug-Allergy Check Rash       Family History:  Cancer-related family history includes Cancer in her father, maternal grandfather, maternal uncle, maternal uncle, and paternal aunt.  She indicated that the status of her mother is unknown. She indicated that the status of her father is unknown. She indicated that the status of her brother is unknown. She indicated that the status of her maternal grandmother is unknown. She indicated that the status of her maternal grandfather is unknown. She indicated that the status of her paternal grandmother is unknown. She indicated that the status of her paternal grandfather is unknown. She indicated that the status of her maternal aunt is unknown. She indicated that the status of her paternal uncle is unknown. She indicated that the status of her neg hx is unknown. She indicated that the status of her other is unknown.           Lab Results   Component Value Date    CREATININE 0.64 03/18/2022     Lab Results   Component Value Date    ALKPHOS 179 (H) 03/18/2022    BILITOT 0.7 03/18/2022    BILIDIR 0.30 03/29/2021    PROT 7.4 03/18/2022    ALBUMIN 4.0 03/18/2022    ALT 13 03/18/2022    AST 35 (H) 03/18/2022                  Burtis Junes, FNP-BC, [facility]  Outpatient Oncology Palliative Care Service  [name]  [address] [place], [place] [address]  [phone]                 The patient reports they are currently: at home. I spent 31 minutes on the real-time audio and video  with the patient on the date of service. I spent an additional  20 minutes on pre- and post-visit activities on the date of service.     The patient was physically located in [place] or a state in which I am permitted to provide care. The patient and/or parent/guardian understood that s/he may incur co-pays and cost sharing, and agreed to the telemedicine visit. The visit was reasonable and appropriate under the circumstances given the patient's presentation at the time.    The patient and/or parent/guardian has been advised of the potential risks and limitations of this mode of treatment (including, but not limited to, the absence of in-person examination) and has agreed to be treated using telemedicine. The patient's/patient's family's questions regarding telemedicine have been answered.     If the visit was completed in an ambulatory setting, the patient and/or parent/guardian has also been advised to contact their provider???s office for worsening conditions, and seek emergSynthroidSynthroidMonitorSynthroidency medicExtractional treatment and/or call 911 if the patient deems either necessary.

## 2022-03-29 NOTE — Unmapped (Signed)
TC to Brooke [name] to discuss holding her capecitabine 3 days prior to and after any oral surgery or tooth extractions due to risk of infection. Her Home phone was not identified; the mobile # is for her sister. Did not leave VM. Will send MyCHart message.

## 2022-03-29 NOTE — Unmapped (Signed)
Telephone call made in attempt to schedule with Dr. Sudie Bailey and no answer; left detailed voicemail with contact information for return call.

## 2022-03-30 DIAGNOSIS — G620 Drug-induced polyneuropathy: Principal | ICD-10-CM

## 2022-03-30 DIAGNOSIS — C7931 Secondary malignant neoplasm of brain: Principal | ICD-10-CM

## 2022-03-30 DIAGNOSIS — S32591A Other specified fracture of right pubis, initial encounter for closed fracture: Principal | ICD-10-CM

## 2022-03-30 NOTE — Unmapped (Addendum)
Referral faxed to for PT    [facility]  [facility]  [address]  [place]  [address]  [phone]  Fax: [phone]

## 2022-04-08 ENCOUNTER — Ambulatory Visit: Admit: 2022-04-08 | Discharge: 2022-04-08 | Payer: MEDICARE | Attending: Medical Oncology | Primary: Medical Oncology

## 2022-04-08 ENCOUNTER — Ambulatory Visit: Admit: 2022-04-08 | Discharge: 2022-04-08 | Payer: MEDICARE

## 2022-04-08 ENCOUNTER — Institutional Professional Consult (permissible substitution): Admit: 2022-04-08 | Discharge: 2022-04-08 | Payer: MEDICARE

## 2022-04-08 DIAGNOSIS — R110 Nausea: Principal | ICD-10-CM

## 2022-04-08 DIAGNOSIS — C50811 Malignant neoplasm of overlapping sites of right female breast: Principal | ICD-10-CM

## 2022-04-08 DIAGNOSIS — C50919 Malignant neoplasm of unspecified site of unspecified female breast: Principal | ICD-10-CM

## 2022-04-08 DIAGNOSIS — Z171 Estrogen receptor negative status [ER-]: Principal | ICD-10-CM

## 2022-04-08 DIAGNOSIS — C7802 Secondary malignant neoplasm of left lung: Principal | ICD-10-CM

## 2022-04-08 DIAGNOSIS — E039 Hypothyroidism, unspecified: Principal | ICD-10-CM

## 2022-04-08 DIAGNOSIS — Z5181 Encounter for therapeutic drug level monitoring: Principal | ICD-10-CM

## 2022-04-08 DIAGNOSIS — Z79899 Other long term (current) drug therapy: Principal | ICD-10-CM

## 2022-04-08 DIAGNOSIS — C7931 Secondary malignant neoplasm of brain: Principal | ICD-10-CM

## 2022-04-08 DIAGNOSIS — R7989 Other specified abnormal findings of blood chemistry: Principal | ICD-10-CM

## 2022-04-08 LAB — CBC W/ AUTO DIFF
BASOPHILS ABSOLUTE COUNT: 0 10*9/L (ref 0.0–0.1)
BASOPHILS RELATIVE PERCENT: 0.4 %
EOSINOPHILS ABSOLUTE COUNT: 0 10*9/L (ref 0.0–0.5)
EOSINOPHILS RELATIVE PERCENT: 0.7 %
HEMATOCRIT: 38.9 % (ref 34.0–44.0)
HEMOGLOBIN: 13.6 g/dL (ref 11.3–14.9)
LYMPHOCYTES ABSOLUTE COUNT: 1.8 10*9/L (ref 1.1–3.6)
LYMPHOCYTES RELATIVE PERCENT: 27.3 %
MEAN CORPUSCULAR HEMOGLOBIN CONC: 35 g/dL (ref 32.0–36.0)
MEAN CORPUSCULAR HEMOGLOBIN: 41.3 pg — ABNORMAL HIGH (ref 25.9–32.4)
MEAN CORPUSCULAR VOLUME: 117.9 fL — ABNORMAL HIGH (ref 77.6–95.7)
MEAN PLATELET VOLUME: 8.2 fL (ref 6.8–10.7)
MONOCYTES ABSOLUTE COUNT: 0.5 10*9/L (ref 0.3–0.8)
MONOCYTES RELATIVE PERCENT: 7.3 %
NEUTROPHILS ABSOLUTE COUNT: 4.1 10*9/L (ref 1.8–7.8)
NEUTROPHILS RELATIVE PERCENT: 64.3 %
NUCLEATED RED BLOOD CELLS: 0 /100{WBCs} (ref <=4)
PLATELET COUNT: 153 10*9/L (ref 150–450)
RED BLOOD CELL COUNT: 3.3 10*12/L — ABNORMAL LOW (ref 3.95–5.13)
RED CELL DISTRIBUTION WIDTH: 15.3 % — ABNORMAL HIGH (ref 12.2–15.2)
WBC ADJUSTED: 6.4 10*9/L (ref 3.6–11.2)

## 2022-04-08 LAB — COMPREHENSIVE METABOLIC PANEL
ALBUMIN: 3.8 g/dL (ref 3.4–5.0)
ALKALINE PHOSPHATASE: 163 U/L — ABNORMAL HIGH (ref 46–116)
ALT (SGPT): 24 U/L (ref 10–49)
ANION GAP: 7 mmol/L (ref 5–14)
AST (SGOT): 35 U/L — ABNORMAL HIGH (ref <=34)
BILIRUBIN TOTAL: 0.6 mg/dL (ref 0.3–1.2)
BLOOD UREA NITROGEN: 13 mg/dL (ref 9–23)
BUN / CREAT RATIO: 16
CALCIUM: 9.3 mg/dL (ref 8.7–10.4)
CHLORIDE: 110 mmol/L — ABNORMAL HIGH (ref 98–107)
CO2: 24.8 mmol/L (ref 20.0–31.0)
CREATININE: 0.79 mg/dL
EGFR CKD-EPI (2021) FEMALE: 84 mL/min/{1.73_m2} (ref >=60)
GLUCOSE RANDOM: 172 mg/dL (ref 70–179)
POTASSIUM: 3.2 mmol/L — ABNORMAL LOW (ref 3.4–4.8)
PROTEIN TOTAL: 6.6 g/dL (ref 5.7–8.2)
SODIUM: 142 mmol/L (ref 135–145)

## 2022-04-08 LAB — MAGNESIUM: MAGNESIUM: 1.6 mg/dL (ref 1.6–2.6)

## 2022-04-08 LAB — CANCER ANTIGEN 27.29: CA 27-29: 74 U/mL — ABNORMAL HIGH (ref <=38.6)

## 2022-04-08 MED ADMIN — heparin, porcine (PF) 100 unit/mL injection 500 Units: 500 [IU] | INTRAVENOUS | @ 15:00:00 | Stop: 2022-04-08

## 2022-04-08 MED ADMIN — trastuzumab (HERCEPTIN) 340 mg in sodium chloride (NS) 0.9 % 250 mL IVPB: 6 mg/kg | INTRAVENOUS | @ 15:00:00 | Stop: 2022-04-08

## 2022-04-08 MED ADMIN — heparin, porcine (PF) 100 unit/mL injection 500 Units: 500 [IU] | INTRAVENOUS | @ 12:00:00 | Stop: 2022-04-08

## 2022-04-08 MED ADMIN — sodium chloride (NS) 0.9 % infusion: 100 mL/h | INTRAVENOUS | @ 14:00:00 | Stop: 2022-04-08

## 2022-04-08 NOTE — Unmapped (Signed)
Clinical Support on 04/08/2022   Component Date Value Ref Range Status    Sodium 04/08/2022 142  135 - 145 mmol/L Final    Potassium 04/08/2022 3.2 (L)  3.4 - 4.8 mmol/L Final    Chloride 04/08/2022 110 (H)  98 - 107 mmol/L Final    CO2 04/08/2022 24.8  20.0 - 31.0 mmol/L Final    Anion Gap 04/08/2022 7  5 - 14 mmol/L Final    BUN 04/08/2022 13  9 - 23 mg/dL Final    Creatinine 16/09/9603 0.79  0.60 - 0.80 mg/dL Final    BUN/Creatinine Ratio 04/08/2022 16   Final    eGFR CKD-EPI (2021) Female 04/08/2022 84  >=60 mL/min/1.68m2 Final    eGFR calculated with CKD-EPI 2021 equation in accordance with [employer] and [employer] of Nephrology Task Force recommendations.    Glucose 04/08/2022 172  70 - 179 mg/dL Final    Calcium 54/08/8118 9.3  8.7 - 10.4 mg/dL Final    Albumin 14/78/2956 3.8  3.4 - 5.0 g/dL Final    Total Protein 04/08/2022 6.6  5.7 - 8.2 g/dL Final    Total Bilirubin 04/08/2022 0.6  0.3 - 1.2 mg/dL Final    AST 21/30/8657 35 (H)  <=34 U/L Final    ALT 04/08/2022 24  10 - 49 U/L Final    Alkaline Phosphatase 04/08/2022 163 (H)  46 - 116 U/L Final    Magnesium 04/08/2022 1.6  1.6 - 2.6 mg/dL Final    WBC 84/69/6295 6.4  3.6 - 11.2 10*9/L Final    RBC 04/08/2022 3.30 (L)  3.95 - 5.13 10*12/L Final    HGB 04/08/2022 13.6  11.3 - 14.9 g/dL Final    HCT 28/41/3244 38.9  34.0 - 44.0 % Final    MCV 04/08/2022 117.9 (H)  77.6 - 95.7 fL Final    MCH 04/08/2022 41.3 (H)  25.9 - 32.4 pg Final    MCHC 04/08/2022 35.0  32.0 - 36.0 g/dL Final    RDW 12/28/7251 15.3 (H)  12.2 - 15.2 % Final    MPV 04/08/2022 8.2  6.8 - 10.7 fL Final    Platelet 04/08/2022 153  150 - 450 10*9/L Final    nRBC 04/08/2022 0  <=4 /100 WBCs Final    Neutrophils % 04/08/2022 64.3  % Final    Lymphocytes % 04/08/2022 27.3  % Final    Monocytes % 04/08/2022 7.3  % Final    Eosinophils % 04/08/2022 0.7  % Final    Basophils % 04/08/2022 0.4  % Final    Absolute Neutrophils 04/08/2022 4.1  1.8 - 7.8 10*9/L Final    Absolute Lymphocytes 04/08/2022 1.8  1.1 - 3.6 10*9/L Final    Absolute Monocytes 04/08/2022 0.5  0.3 - 0.8 10*9/L Final    Absolute Eosinophils 04/08/2022 0.0  0.0 - 0.5 10*9/L Final    Absolute Basophils 04/08/2022 0.0  0.0 - 0.1 10*9/L Final    Macrocytosis 04/08/2022 Moderate (A)  Not Present Final

## 2022-04-08 NOTE — Unmapped (Signed)
Patient arrived to infusion chair ambulatory accompanied by family member following her provider and nurse/lab visit appointment. Port accessed from earlier appointment, blood return noted and flushes well. Patient labs reviewed for electrolytes and potassium level 3.2. Patient states she took supplement this morning and last evening. Unable to take the large potassium tablets here without milk. Also did not wish to remain for additional time for IV. Patient instructed and given information on potassium rich foods. Dose verified and Medication released for pharmacy preparation and no parameters listed in treatment plan for trastuzumab infusion. Infusion completed without complication, Tubing flushed to ensure complete administration of medication. Port flushed, heparin instilled and needle removed prior to discharge from infusion clinic. Port without any bleeding noted. 2x2 applied and covered with band aid.

## 2022-04-08 NOTE — Unmapped (Signed)
[facility] Outpatient Nutrition Services   Medical Nutrition Therapy Consultation       Visit Type:    Return Assessment    Referral Reason:   Nutrition Follow-up    Brooke [name] is a 66 y.o. female seen for medical nutrition therapy follow-up for weight loss and decreased intake in the setting of metastatic breast cancer to the lung and brain. Current therapy consists of Xeloda, Tucatinib and Herceptin. Her sister was present during visit. Increase in appetite reported since last visit. Her medication list, allergies, notes from last several encounters and lab results were reviewed.     Past Medical History:   Diagnosis Date   ??? Abnormal ECG unsure   ??? Alcoholism (CMS-HCC)    ??? Allergic rhinitis    ??? Anemia    ??? Anxiety     insomnia   ??? Arthritis not sure   ??? Brain concussion    ??? Breast cancer (CMS-HCC)     chemo for now with mets   ??? COPD (chronic obstructive pulmonary disease) (CMS-HCC)    ??? Depression    ??? Dysphagia    ??? Fatty liver 07/08/2021   ??? Genitourinary disease    ??? GERD (gastroesophageal reflux disease)    ??? Headache    ??? HTN (hypertension)    ??? Hyperlipidemia    ??? Hypothyroidism, unspecified type 01/21/2022   ??? Insomnia    ??? 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 Stroke (CMS-HCC) Nov. 2020    Dr. Theodoro Kalata   ??? Tobacco dependence        Anthropometrics   Height:  157.5 cm  Current Weight:   52.2 (kg)   BMI: 21 kg/m2  Ideal Body Weight: 50 (kg)  Percent Ideal Body Weight: 104%    Weight history:  Wt Readings from Last 6 Encounters:   04/08/22 52.2 kg (115 lb 1.3 oz)   04/08/22 52.6 kg (116 lb)   02/25/22 52.4 kg (115 lb 8.3 oz)   02/25/22 52.5 kg (115 lb 12.8 oz)   02/04/22 51.9 kg (114 lb 6.4 oz)   02/04/22 51.8 kg (114 lb 1.6 oz)     Weight has remained fairly stable.     Nutrition Risk Screening:   Food Insecurity: Food Insecurity Present   ??? Worried About Programme researcher, broadcasting/film/video in the Last Year: Sometimes true   ??? Ran Out of Food in the Last Year: Sometimes true      Physical Activity: Physical activity level is light with some exercise.     ASPEN/AND Malnutrition Screening:   Pt meets ASPEN/AND criteria for malnutrition as noted below:  Malnutrition Designation:??Non-severe in context of chronic illness-continues  -- Weight loss:??does not meet criteria for weight loss  -- Energy Intake:??intake <75% for > 1 month  -- Muscle Mass:??mild interosseous wasting noted, mild temporal wasting noted  -- Fat Mass:??mild orbital wasting  -- Fluid Accumulation:??did not assess  -- Functional Assessment:??did not assess    Biochemical Data, Medical Tests and Procedures:  All pertinent labs and imaging reviewed by Genevie Ann at 2:30 PM 04/08/2022.    Medications and Vitamin/Mineral Supplementation:   All nutritionally pertinent medications reviewed on 04/08/2022.   Nutritionally pertinent medications include: remeron, synthroid  She is taking nutrition supplements. Potassium supplements per MD prescription    Current Outpatient Medications   Medication Sig Dispense Refill   ??? albuterol HFA 90 mcg/actuation inhaler Inhale 2 puffs every six (6) hours as  needed for wheezing.     ??? aluminum-magnesium hydroxide-simethicone 200-200-20 mg/5 mL Susp 80 mL, diphenhydrAMINE 12.5 mg/5 mL Liqd 200 mg, nystatin 100,000 unit/mL Susp 8,000,000 Units, distilled water Liqd 80 mL, lidocaine 2% viscous 2 % Soln 80 mL Take 5 mL by mouth every six (6) hours as needed. Swish, gargle, spit or swallow if throat issues 80 mL 2   ??? capecitabine (XELODA) 500 MG tablet Take 1 tablet (500 mg total) by mouth two (2) times a day, continuously. To be taken every day with no breaks 60 tablet 5   ??? clobetasoL (TEMOVATE) 0.05 % ointment Apply twice a day to rash on palm until smooth/clear. 30 g 1   ??? clonazePAM (KLONOPIN) 0.5 MG tablet Take 0.5 mg daily as needed for anxiety.  Do not exceed 0.5 mg/day. 30 tablet 0   ??? diclofenac sodium (VOLTAREN) 1 % gel Apply 2 g topically four (4) times a day as needed for arthritis or pain. 150 g 2   ??? diphenoxylate-atropine (LOMOTIL) 2.5-0.025 mg per tablet Take 1 tablet by mouth four (4) times a day as needed for diarrhea. 30 tablet 1   ??? esomeprazole (NEXIUM) 40 MG capsule Take 1 capsule (40 mg total) by mouth in the morning. 90 capsule 3   ??? FARXIGA 5 mg Tab tablet Take 1 tablet (5 mg total) by mouth daily.     ??? HYDROcodone-acetaminophen (NORCO 10-325) 10-325 mg per tablet Take 1 tablet by mouth every twelve (12) hours as needed for pain. 60 tablet 0   ??? levothyroxine (SYNTHROID) 25 MCG tablet Take 1 tablet (25 mcg total) by mouth daily. 30 tablet 11   ??? lidocaine (LIDODERM) 5 % patch Place 1 patch on the skin daily. Apply to affected area for 12 hours only each day (then remove patch) 30 patch 3   ??? lisinopriL-hydrochlorothiazide (PRINZIDE,ZESTORETIC) 20-25 mg per tablet Take 1 tablet by mouth daily. 90 tablet 3   ??? MAGNESIUM ORAL Take by mouth daily. OTC (Patient not taking: Reported on 02/04/2022)     ??? mirtazapine (REMERON) 7.5 MG tablet Take 1 tablet (7.5 mg total) by mouth nightly. 30 tablet 0   ??? naloxone (NARCAN) 4 mg nasal spray One spray in either nostril once for known/suspected opioid overdose. May repeat every 2-3 minutes in alternating nostril til EMS arrives (Patient not taking: Reported on 11/11/2021) 2 each 0   ??? ondansetron (ZOFRAN) 4 MG tablet Take 1 tablet (4 mg total) by mouth every six (6) hours as needed for nausea. (Patient not taking: Reported on 04/08/2022) 30 tablet 1   ??? potassium chloride (KLOR-CON) 20 mEq packet Take 1 packet (20 mEq total) by mouth Two (2) times a day. 60 packet 0   ??? pregabalin (LYRICA) 200 MG capsule Take 1 capsule (200 mg total) by mouth Two (2) times a day. 60 capsule 0   ??? prochlorperazine (COMPAZINE) 10 MG tablet Take 1 tablet (10 mg total) by mouth every six (6) hours as needed for nausea. (Patient not taking: Reported on 04/08/2022) 30 tablet 6   ??? senna (SENOKOT) 8.6 mg tablet Take 3 tablets by mouth in the morning. 30 tablet 2   ??? thiamine (VITAMIN B-1) 50 MG tablet Take 1 tablet (50 mg total) by mouth in the morning. 90 tablet 1   ??? traZODone (DESYREL) 50 MG tablet Take 1.5 tablets (75 mg total) by mouth nightly. 45 tablet 1   ??? tucatinib (TUKYSA) 150 mg tablet Take 1 tablet (150 mg total)  by mouth two (2) times a day. 60 tablet 1   ??? tucatinib (TUKYSA) 50 mg tablet Take 2 tablets (100 mg total) by mouth two (2) times a day . 120 tablet 3   ??? venlafaxine (EFFEXOR-XR) 150 MG 24 hr capsule Take 2 capsules (300 mg total) by mouth daily. 60 capsule 5     No current facility-administered medications for this visit.     Facility-Administered Medications Ordered in Other Visits   Medication Dose Route Frequency Provider Last Rate Last Admin   ??? heparin, porcine (PF) 100 unit/mL injection 500 Units  500 Units Intravenous Q30 Min PRN Olivia Mackie, MD   500 Units at 04/08/22 1117   ??? OKAY TO SEND MEDICATION/CHEMOTHERAPY TO OUTPATIENT UNIT   Other Once [facility] [place]       ??? sodium chloride (NS) 0.9 % infusion  100 mL/hr Intravenous Continuous Clarissa Trixie Dredge, [place]   Stopped at 04/08/22 1117       Nutrition History:     Dietary Restrictions: She reports intolerance to gluten containing foods in the setting of celiac disease. She reports hx of dx of Celiac.    Gastrointestinal Issues: Diarrhea only on infusion days prior to receiving the infusion.    Oral Issues:  poor dentition/bad teeth; taste and smell changes she feels occurred with COVID-19 infection that have lingered post    Hunger and Satiety: Appetite slightly improved    Food Safety and Access: She expressed limited access to food. She has been enrolled in the [place] program at [facility].    Diet Recall:   She finds she eats more when eating with others. On the weekends eats with her boyfriend and pigs out. He also typically cooks and cleans post meal. She did have one day this past week she ate 3 full meals in one day plus an apple and popcorn with chocolate. One of the meals consisted of a frozen meal.     Supplements: [employer] Breakfast- 1 packet daily mixed with milk; Denied any GI issues post and she does not think the one she buys contains gluten    Beverages: for the past week, she has been drinking 2 Body Armor drinks daily plus juices and milk and other beverages. She asked if she should continue to drink the Body Armor as she finds the drink expensive.     She asked about ways to increase her potassium.    Nutrition Assessment       Inadequate oral intake related to metastatic breast cancer and social,??enviromental and financial factors as evidenced by eating ~ 1 meal daily, unable to cook often, food insecurity reported and 7.5% weight loss noted x 4.5 months-continues  ??  Daily Estimated Nutritional Needs: Weight of 52.5 (kg) used to calculate needs  Energy:????1600-1850??kcals [????using ??30-35kcal/kg????]  Protein:??????63-73??gm [????using ??1.2-1.4g/kg????]  Fluid:??????1600-1833ml??[??33ml/kcal]    Nutrition Goals & Evaluation      Intake will aim to meet >75% of estimated nutrient needs  (Ongoing and Progressing)  Aim for weight maintenance (Ongoing and Progressing)    Nutrition goals reviewed, and relevant barriers identified and addressed: acuity of illness and financial concerns. She is evaluated to have fair willingness and ability to achieve nutrition goals.     Nutrition Intervention      - Nutrition Counseling: Problem solving  - Oral Nutrition Supplementation: [employer] Instant Breakfast     Nutrition Plan:   ??? Encouraged continuing to eat several times during the day.   ??? Recommended  continuing with [employer] Breakfast at least 1 daily as able, as tolerated. Reviewed that some [employer] packets may contain wheat/gluten.   ??? Discussed hydration and as she is not experiencing diarrhea on a daily basis or vomiting, would not recommend electrolyte beverages at this time such as the [employer].   ??? Provided handout with list of foods with potassium.   ??? Provided baking soda and salt rinses and encouraged using prior to meals to help clean the mouth of any lingering taste before she eats.     Will remain available as needed, as desired by patient.       Food/Nutrition-related history, Anthropometric measurements, Biochemical data, medical tests, procedures, Nutrition-focused physical findings, Patient understanding or compliance with intervention and recommendations  and Effectiveness of nutrition interventions will be assessed at time of follow-up.     Recommendations for Care Team :  Encourage continued good PO intake, eating several times per day.   Recommend [employer] Breakfast 1 daily as able, as tolerated.   Recommend trying baking soda and salt rinses to help clean the mouth prior to eating.       Time spent 25 minutes.

## 2022-04-08 NOTE — Unmapped (Signed)
[facility]  -----------------------------------------------------------------------------------------------------  Follow-up Outpatient Evaluation    PCP: [name], MD     Oncology team: Surgical oncology: Tyson Alias M.D.  Kerrin Champagne ---------->   Alan Ripper Mekenna Finau/ [name]     Reason for Visit: Here for management of breast cancer.  -----------------------------------------------------------------------------------------------------    Assessment:  Brooke [name] is a 66 y.o. 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 She was switched d/t abnormal LFT's (from ETOH/NASH cirrhosis) on TDM1 (not progression).     Brooke [name] presents today of evaluation while on Xeloda, tucatinib + Herceptin. Doing decently well, with weight increasing. Planning on holding cape 3 days before and after upcoming dental appointment. Potassium remains low despite continuing K+ PO. Diarrhea has resolved.  She has ongoing fatigue, anorexia, dizziness.      1. Metastatic breast cancer, ER-, PR+, HER-2 positive to lung, br  A. Systemic therapy    First-line: THP. Taxol d/c for G3 neuropathy.  Pertuzumab d/c for G3 diarrhea.  Not progression.   Second line: Trastuzumab + AI -->Tamoxifen 2/2 arthralgias.   Third line: Rebiopsy ER -/PR 10%/HER-2 +ve. C1 TDM1 03/23/20 limited by neuropathy. 09/01/20: DR TDM1 to 2.4 mg/kg. Discontinue TDM1 d/t abnormal LFT's and not progression.   Fourth line:  Herceptin + tucatinib + capecitabine (Xeloda). 05/20/21 DR capecitabine to 1000 mg BID, and tucatinib to 250 mg BID d/t diarrhea. D/t issues with adherence, her capecitabine dose was changed to 500 mg po BID on a continuous schedule on 07/06/21.     Potential subsequent lines:  Consider return to TDM1 or Enhertu on progression predicated on LFTs.    B.  Systemic imaging.   -- CT C/A/P + Bone scan 01/21/22: stable disease w/o evidence of progression.   -- Will repeat at 6 month mark (last week of June)     C.  Brain mets/neuroimaging   Surgery: 01/15/19: resection of the right frontal met. [+++] MBC.  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   03/16/22 MRI Brain: SD    D.  Molecular  Genomics:  STRATA: ERBB2, PIK3CA.  Not eligible for HARMONY.  Genetic: Referral submitted in 03/2020, patient was never reached.   Tumor Markers: stable    E. Radiation:   09/05/17: CK to 7 brain lesions. 09/13/18: CK (1#) right 9 mm lesion. 2/28 - 02/28/2019: CK 25 Gy in 5# to left frontal resection bed.     F. 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 Barbette Merino who thought not likely consequential but recommended CV cards eval.   >ECHO 12/30/20, EF 60-65%, mild pulm HTN  >Saw cards 01/2022.  Will send msg to cards re: repeating ECHO, last 2022.  Per cardiology, no plans to repeat ECHO; ok to proceed with limited ECHO. Ordered today     2. Hypokalemia:   > previously received IV K+ for potassium of 2.9  > taking one tablet potassium supplementation twice daily; potassium 3.2 today  > Increased PO supplementation to two tablets in the morning one tablet at night  > Instructed to bring medications to next appointment to confirm dosages (suspect these are 40 mEq tablets based on record review)  > Message sent to CPP regarding optimization, will monitor with q3 week labs    3.  Comorbidities/supportive care   Sees  Burtis Junes from pall care.  She is supposed to get an xray today of left UE  Not rediscussed today:  > Depression/Anxiety: Venlafaxine 225 mg. C/w clonazepam. F/u psychiatry/[facility]  > Peripheral neuropathy: Venlafaxine 225 mg. C/w Lyrica dose 200 mg BID.  Paraneoplastic panels unremarkable. Dose reduced TDM1.  Did not tolerate methadone due to sedation/dizziness. Follows with palliative care. Can refer back to neurology to consider TENS versus scrambler therapy.   > Smoking cessation: Previously encouraged to stop smoking. 1 ppd currently.  Dr [name]  Previously prescribed Chantix.   > Insomnia: Continue trazodone 75mg . Will continue. -- thinks this works better than Remeron.   > Arthralgias: C-spine x-ray showed DJD.  Muscle relaxant by PCP. Referred to PT for back previously. Currently receiving neck PT.  If no improvement refer back to Dr. Glenice Laine. Consider PCI. Nondisplaced right hip fracture: Has morphine patches for mgmt.   > Cirrhosis. Abnormal LFTs. Follows with GI, Dr. Raford Pitcher. No more alcohol.  >Scapulothoracic bursitis: Consider steroid injections. Referred to Dr. Glenice Laine.    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 Applying foot and heel lotion. Stable  >Hypothyroidism: continue levothyroxine 25 mcg daily    3.  Follow-up   -- Continue Tucatinib + IV Trastuzumab+ Xeloda  -- Labs w/each herceptin infusion to monitor potassium   -- C/W Klor-con 40 MEQ twice in the morning, once at night; levothyroxine 25 mcg daily  -- RTC 3 weeks for next infusion; provider follow up in 6 weeks    -----------------------------------------------------------------------------------------------------  Interval history:  Brooke [name] is a 66 y.o. F who presents today for follow up on Xeloda, Tucatinib, Herceptin. -- here with her sister and threatening to fire me if I am more than 1 min late. Apparently nurses tell me she fired Dr [name] routinely.   -- Continues to take potassium one tablet twice daily, taking nutritional drinks  -- Reports ongoing taste and food texture changes secondary to COVID infections  -- Diarrhea has resolved  -- Breathing is good  -- Left arm pain, will be getting XR today   -- No chest pain   -- Present with sister, Brooke [name]  -- doing ok, fatigue, poor appetite  -- smoking cigarettes and 1 bowl of marijuana a day  -- denies alcohol use    Social history update: Lives alone.  Often accompanied by sisters Brooke [name] and Brooke [name] . Review of Systems: A complete twelve systems review was obtained and is positive per the HPI but otherwise negative or unchanged.   ONC HISTORY  Metastatic HR + HER-2 overexpressing breast cancer to lung and brain.  Presenting stage 4 disease  In 2017  First-line and second line: Trastuzumab + AI -->Tamoxifen d/t arthralgias.  3rd line:  TDM1.  4th line: capecitabine (xeloda), tucatinib, herceptin    Consider [id number], Enhertu on progression.     Radiation: 09/05/17: CK to 7 brain lesions. 09/13/18: CK (1#) right 9 mm lesion. 2/28 - 02/28/2019: CK 25 Gy in 5# to left frontal resection bed.  Surgery: 01/15/19: resection of the right frontal met.  Genomic: STRATA: ERBB2, PIK3CA.  Not eligible for HARMONY.    2017 or earlier:  R breast wound and nipple inversion which patient describes herself as ignoring for some time    02/04/16:  Mammogram R breast showing 3.1 cm irregular spiculated dense mass in lateral Rt breast w nipple and skin retraction. 2 subcentimeter irregular masses in the RUOQ  (10', 11') (possible satellite lesions). Large Rt axillary lymph node 1.7. Lt breast clear. BIRAD  5.    02/08/16:  Core biopsy R axillary LN (breast bx deferred due to open wound) Gr 3, IDC with apocrine features. ER (-), PR (31-40), HER-2 (3+). Noted to have multiple skin lesions.    02/10/16:  Initial staging with CT CAP: Large, necrotic right breast mass with wide open tract to the skin, consistent with known right breast malignancy.  Numerous enlarged right axillary, subpectoral, mediastinal, bilateral hilar lymph nodes and innumerable bilateral pulmonary nodules.  Bone scan negative for osseous mets.    --Started trastuzumab initially with THP.  Taxol d/c after three cycles for G3 neuropathy.  Pertuzumab d/c for G3 diarrhea.  Not discontinued due to progression.    04/28/16:  Switched to anastrozole/trastuzumab  --patient had trouble tolerating AIs, switched to exemestane    07/13/16: genomic testing with STRATA showing ERBB2 copy # alteration, PIK3CA p.[id number]    08/01/17:  MRI showed multiple small brain metastases   --completed CK to 7 lesions on 09/05/17.   --CK to the right 9 mm lesion (1 fx on 09/13/18).    --continued on AI/trastuzumab    11/2018:  progression of her frontal lobe lesion.   --resection of the right frontal mass by Dr. Tresa [name] on 01/15/2019.  Pathology was consistent with metastatic breast cancer.  --2/28 - 02/28/2019: CK 25 Gy in 5# to left frontal resection bed     04/2019: switched to tamoxifen/trastuzmab due to intolerance of AIs (joint pain    09/2019:  Concern for possible slight extra-cranial progression in LLL lung mass, elected to continue on current tx    01/03/20:  Repeat CT with new lingular 1.9 cm nodular opacity; differential diagnosis includes new metastatic lesion and consolidative/infectious opacity. CT chest 1 month is recommended to help discriminate between these two etiologies.  Also slight increase R breast mass.    02/03/20:  Repeat CT again with lingular 1.9 cm nodule unchanged to slightly increased in size, left lower lobe 3.5 cm mass slightly increased in size. Discussed at [facility] due to concern that lingular mass might represent lung primary in current smoker. Recommended biopsy.      01/2020:  Changed to fulvestrant/trastuzumab due to progression    03/05/20:  FNA lung biopsy showing carcinoma c/w breast primary (GATA3+, TTF1-), ER neg/PR 10%/HER2 3+.  Switched to [employer]. C1D1 03/23/20.    03/29/21: Switched to capecitabine (xeloda), tucatinib, herceptin d/t abnormal LFT's.      Onc Family History:   - Father: prostate  - Mat grandfather: prostate  - Mat uncle: lung  - Pat aunt: breast   - Great niece: adrenal     Social History:   Social History     Social History Narrative    She lives in senior housing in [place] x2 months. he has 4 sisters, 2 nearby and involved in care. She has 1 son-Brooke [name], recently married 63 yo, who lives about an hour and a half away in [place]. He works full-time as a Sales executive. She is still a chronic smoker (1ppd). She is currently divorced.         Goes to [employer] in [place] and gets a lot of support. Her church friend Brooke Endo and her sisters goes with her to chemo appts.     Physical Examination:   Vitals:    04/08/22 0809   BP: 117/62   Pulse: 72   Temp: 36.3 ??C (97.3 ??F)   SpO2: 96%     General: Chronically ill appearing female in no acute  distress.    HEENT: Well-healed (R) frontal scalp scar. EOMI. Sclerae anicteric.   Neck: Supple. No cervical or supraclavicular adenopathy.   Pulm: Lungs clear to auscultation bilat; breathing non-labored.   Cardiac: Regular rate and rhythm; no LE edema.    Abdominal: Non tender  Musculoskeletal: unremarkable  Breasts/chest: Deferred: last exam:  (R) breast: Nipple autolyzed. Small ulcer laterally to redeveloping nipple closed. (L) breast exam: Port to the upper left chest.  Neurologic:  Alert and oriented. Grossly non-focal  Lymphatic: No cervical or supraclavicular adenopathy. No palpable right axillary adenopathy.  Skin: Slight Erythema of palms/feet, scaling    DATA REVIEW:    MRI brain 04/08/22  1. Unchanged to slightly decreased size of left frontal lobe metastasis.   2. No evidence of new metastases.         Pertinent labs/imaging/pathology reviewed in detail.      Clinical exam with no evidence of local recurrence and no symptoms of distant metastases.  I attest that I, Brooke [name], personally documented this note while acting as scribe for Denman George, MD.      Brooke [name], Scribe.  04/08/2022   ______________________________________________      Documentation assistance provided by Medical Scribe, Brooke [name], who was present during the entirety of the visit. I reviewed the note below and validated all of the information provided to ensure accuracy and completeness.     Denman George, MD      ------------------------------------------------------------

## 2022-04-11 DIAGNOSIS — C50919 Malignant neoplasm of unspecified site of unspecified female breast: Principal | ICD-10-CM

## 2022-04-11 DIAGNOSIS — C50811 Malignant neoplasm of overlapping sites of right female breast: Principal | ICD-10-CM

## 2022-04-12 NOTE — Unmapped (Signed)
2nd attempt to schedule patient with Dr. Sudie Bailey and no answer; left voicemail with contact information for return call

## 2022-04-13 MED ORDER — POTASSIUM CHLORIDE ER 10 MEQ TABLET,EXTENDED RELEASE(PART/CRYST)
ORAL_TABLET | Freq: Two times a day (BID) | ORAL | 3 refills | 90 days | Status: CP
Start: 2022-04-13 — End: 2023-04-13

## 2022-04-13 NOTE — Unmapped (Signed)
Pt sent msg confirming she is taking 10 meq BID K Cl.  Will increase to 20 meq BID K Cl.   New rx sent.

## 2022-04-15 DIAGNOSIS — G620 Drug-induced polyneuropathy: Principal | ICD-10-CM

## 2022-04-15 DIAGNOSIS — T451X5A Adverse effect of antineoplastic and immunosuppressive drugs, initial encounter: Principal | ICD-10-CM

## 2022-04-15 MED ORDER — PREGABALIN 200 MG CAPSULE
ORAL_CAPSULE | Freq: Two times a day (BID) | ORAL | 0 refills | 30.00000 days | Status: CP
Start: 2022-04-15 — End: ?

## 2022-04-15 NOTE — Unmapped (Signed)
[facility] Refill Coordination Note    Specialty Medication(s) to be Shipped:   Hematology/Oncology: Capecitabine 500mg , directions: Take 1 tablet (500 mg total) by mouth two (2) times a day, continuously., Tukysa 150mg  and Tukysa 50mg     Other medication(s) to be shipped: No additional medications requested for fill at this time     Brooke [name], DOB: 18-Jan-1956  Phone: [phone] (home)       All above HIPAA information was verified with patient.     Was a Nurse, learning disability used for this call? No    Completed refill call assessment today to schedule patient's medication shipment from the [facility] [phone]).  All relevant notes have been reviewed.     Specialty medication(s) and dose(s) confirmed: Regimen is correct and unchanged.   Changes to medications: Angelice reports no changes at this time.  Changes to insurance: No  New side effects reported not previously addressed with a pharmacist or physician: None reported  Questions for the pharmacist: No    Confirmed patient received a Conservation officer, historic buildings and a Surveyor, mining with first shipment. The patient will receive a drug information handout for each medication shipped and additional FDA Medication Guides as required.       DISEASE/MEDICATION-SPECIFIC INFORMATION        N/A    SPECIALTY MEDICATION ADHERENCE     Medication Adherence    Patient reported X missed doses in the last month: 0  Specialty Medication: capecitabine 500 MG tablet (XELODA)  Patient is on additional specialty medications: Yes  Additional Specialty Medications: TUKYSA 150 mg tablet (tucatinib)  Patient Reported Additional Medication X Missed Doses in the Last Month: 0  Patient is on more than two specialty medications: Yes  Specialty Medication: TUKYSA 50 mg tablet (tucatinib)  Patient Reported Additional Medication X Missed Doses in the Last Month: 0  Informant: patient              Were doses missed due to medication being on hold? No    Capecitabine 500 mg: 10 days of medicine on hand   Tukysa 50 mg: 10 days of medicine on hand   Tukysa  150 mg: 10 days of medicine on hand       REFERRAL TO PHARMACIST     Referral to the pharmacist: Not needed      [facility]     Shipping address confirmed in Epic.     Delivery Scheduled: Yes, Expected medication delivery date: 04/20/22.     Medication will be delivered via Next Day Courier to the prescription address in Epic WAM.    Brooke [name]   [facility] Specialty Technician

## 2022-04-15 NOTE — Unmapped (Signed)
Refill requested. PDMP reviewed and appropriate. Refill sent to preferred pharmacy.

## 2022-04-19 MED FILL — CAPECITABINE 500 MG TABLET: ORAL | 30 days supply | Qty: 60 | Fill #2

## 2022-04-19 MED FILL — TUKYSA 150 MG TABLET: ORAL | 30 days supply | Qty: 60 | Fill #1

## 2022-04-19 MED FILL — TUKYSA 50 MG TABLET: 30 days supply | Qty: 120 | Fill #3

## 2022-04-20 NOTE — Unmapped (Signed)
TC to Ms Cudmore to clarify her phone call to Rad Onc yesterday. Unsure of her need. Left brief message to call RN Triage line to clarify who we are referring her to.

## 2022-04-22 MED ORDER — CLONAZEPAM 0.5 MG TABLET
ORAL_TABLET | 0 refills | 0 days
Start: 2022-04-22 — End: ?

## 2022-04-25 MED ORDER — CLONAZEPAM 0.5 MG TABLET
ORAL_TABLET | 0 refills | 0 days | Status: CP
Start: 2022-04-25 — End: ?

## 2022-04-27 ENCOUNTER — Ambulatory Visit: Payer: Medicare HMO | Admitting: Physical Therapy

## 2022-04-29 ENCOUNTER — Ambulatory Visit: Admit: 2022-04-29 | Discharge: 2022-04-30 | Payer: MEDICARE

## 2022-04-29 DIAGNOSIS — R110 Nausea: Principal | ICD-10-CM

## 2022-04-29 DIAGNOSIS — C50919 Malignant neoplasm of unspecified site of unspecified female breast: Principal | ICD-10-CM

## 2022-04-29 DIAGNOSIS — Z720 Tobacco use: Principal | ICD-10-CM

## 2022-04-29 DIAGNOSIS — R197 Diarrhea, unspecified: Principal | ICD-10-CM

## 2022-04-29 DIAGNOSIS — S00522S Blister (nonthermal) of oral cavity, sequela: Principal | ICD-10-CM

## 2022-04-29 DIAGNOSIS — C50811 Malignant neoplasm of overlapping sites of right female breast: Principal | ICD-10-CM

## 2022-04-29 DIAGNOSIS — S00522A Blister (nonthermal) of oral cavity, initial encounter: Principal | ICD-10-CM

## 2022-04-29 LAB — CBC W/ AUTO DIFF
BASOPHILS ABSOLUTE COUNT: 0 10*9/L (ref 0.0–0.1)
BASOPHILS RELATIVE PERCENT: 0.4 %
EOSINOPHILS ABSOLUTE COUNT: 0 10*9/L (ref 0.0–0.5)
EOSINOPHILS RELATIVE PERCENT: 0.9 %
HEMATOCRIT: 40.3 % (ref 34.0–44.0)
HEMOGLOBIN: 14 g/dL (ref 11.3–14.9)
LYMPHOCYTES ABSOLUTE COUNT: 2.3 10*9/L (ref 1.1–3.6)
LYMPHOCYTES RELATIVE PERCENT: 42.9 %
MEAN CORPUSCULAR HEMOGLOBIN CONC: 34.6 g/dL (ref 32.0–36.0)
MEAN CORPUSCULAR HEMOGLOBIN: 40.8 pg — ABNORMAL HIGH (ref 25.9–32.4)
MEAN CORPUSCULAR VOLUME: 117.8 fL — ABNORMAL HIGH (ref 77.6–95.7)
MEAN PLATELET VOLUME: 8.2 fL (ref 6.8–10.7)
MONOCYTES ABSOLUTE COUNT: 0.6 10*9/L (ref 0.3–0.8)
MONOCYTES RELATIVE PERCENT: 10.5 %
NEUTROPHILS ABSOLUTE COUNT: 2.4 10*9/L (ref 1.8–7.8)
NEUTROPHILS RELATIVE PERCENT: 45.3 %
NUCLEATED RED BLOOD CELLS: 0 /100{WBCs} (ref <=4)
PLATELET COUNT: 162 10*9/L (ref 150–450)
RED BLOOD CELL COUNT: 3.42 10*12/L — ABNORMAL LOW (ref 3.95–5.13)
RED CELL DISTRIBUTION WIDTH: 15.7 % — ABNORMAL HIGH (ref 12.2–15.2)
WBC ADJUSTED: 5.3 10*9/L (ref 3.6–11.2)

## 2022-04-29 LAB — COMPREHENSIVE METABOLIC PANEL
ALBUMIN: 3.9 g/dL (ref 3.4–5.0)
ALKALINE PHOSPHATASE: 188 U/L — ABNORMAL HIGH (ref 46–116)
ALT (SGPT): 14 U/L (ref 10–49)
ANION GAP: 1 mmol/L — ABNORMAL LOW (ref 5–14)
AST (SGOT): 33 U/L (ref <=34)
BILIRUBIN TOTAL: 0.7 mg/dL (ref 0.3–1.2)
BLOOD UREA NITROGEN: 6 mg/dL — ABNORMAL LOW (ref 9–23)
BUN / CREAT RATIO: 7
CALCIUM: 9.1 mg/dL (ref 8.7–10.4)
CHLORIDE: 114 mmol/L — ABNORMAL HIGH (ref 98–107)
CO2: 25.1 mmol/L (ref 20.0–31.0)
CREATININE: 0.87 mg/dL — ABNORMAL HIGH
EGFR CKD-EPI (2021) FEMALE: 75 mL/min/{1.73_m2} (ref >=60)
GLUCOSE RANDOM: 88 mg/dL (ref 70–179)
POTASSIUM: 4 mmol/L (ref 3.4–4.8)
PROTEIN TOTAL: 6.7 g/dL (ref 5.7–8.2)
SODIUM: 140 mmol/L (ref 135–145)

## 2022-04-29 LAB — MAGNESIUM: MAGNESIUM: 1.8 mg/dL (ref 1.6–2.6)

## 2022-04-29 MED ADMIN — sodium chloride (NS) 0.9 % infusion: 100 mL/h | INTRAVENOUS | @ 19:00:00 | Stop: 2022-04-29

## 2022-04-29 MED ADMIN — diphenoxylate-atropine (LOMOTIL) 2.5-0.025 mg per tablet 1 tablet: 1 | ORAL | @ 18:00:00 | Stop: 2022-04-29

## 2022-04-29 MED ADMIN — heparin, porcine (PF) 100 unit/mL injection 500 Units: 500 [IU] | INTRAVENOUS | @ 19:00:00 | Stop: 2022-04-29

## 2022-04-29 MED ADMIN — trastuzumab (HERCEPTIN) 340 mg in sodium chloride (NS) 0.9 % 250 mL IVPB: 6 mg/kg | INTRAVENOUS | @ 19:00:00 | Stop: 2022-04-29

## 2022-04-29 NOTE — Unmapped (Signed)
Brief Adult Oncology Infusion Clinic Note:    Ms. Mentzer requests lomotil for diarrhea. She takes lomotil as needed at home.   Will order lomotil 1 tablet oral once.        Kennon Portela, AGNP-BC  [facility]

## 2022-04-29 NOTE — Unmapped (Signed)
Patient arrived to infusion clinic in stable condition for C18D1 trastuzumab treatment. VS and weight obtained. Port accessed per protocol with brisk blood return and good flush noted. Labs obtained and reviewed, but no parameters necessary for treatment today. Patient requested lomotil for diarrhea. APP notified and 1 tab lomotil administered as ordered. Patient requested refill of prescription mouthwash. Nurse navigator notified and prescription sent to outside pharmacy. Infusion administered as ordered. Patient tolerated well without adverse effect noted while in clinic. Port de-accessed per protocol, brisk blood return, good flush noted, heparin locked, gauze dressing applied to site. AVS declined. Patient discharged home to self care in stable condition, accompanied by caregiver.

## 2022-04-29 NOTE — Unmapped (Signed)
TC to Ms Eduardo to follow up her request to her infusion RN that she ask for a refill on a mouthwash. Stated to the infusion RN that her mouth was sore.   LVM for her to call back to our RN Triage line or send in MyChart message to clarify. Will also send her MyChart message and will message Dr. [name].

## 2022-05-01 DIAGNOSIS — S00522S Blister (nonthermal) of oral cavity, sequela: Principal | ICD-10-CM

## 2022-05-01 DIAGNOSIS — S00522A Blister (nonthermal) of oral cavity, initial encounter: Principal | ICD-10-CM

## 2022-05-01 DIAGNOSIS — Z720 Tobacco use: Principal | ICD-10-CM

## 2022-05-01 DIAGNOSIS — C50811 Malignant neoplasm of overlapping sites of right female breast: Principal | ICD-10-CM

## 2022-05-01 DIAGNOSIS — K1230 Oral mucositis (ulcerative), unspecified: Principal | ICD-10-CM

## 2022-05-03 ENCOUNTER — Ambulatory Visit: Payer: Medicare HMO | Attending: Nurse Practitioner

## 2022-05-03 ENCOUNTER — Encounter: Payer: Medicare HMO | Admitting: Physical Therapy

## 2022-05-03 DIAGNOSIS — M79622 Pain in left upper arm: Secondary | ICD-10-CM | POA: Insufficient documentation

## 2022-05-03 DIAGNOSIS — M25612 Stiffness of left shoulder, not elsewhere classified: Secondary | ICD-10-CM | POA: Insufficient documentation

## 2022-05-03 DIAGNOSIS — M6281 Muscle weakness (generalized): Secondary | ICD-10-CM | POA: Diagnosis present

## 2022-05-03 NOTE — Therapy (Signed)
[facility] ?[address] ?[place], [place], [address] ?Phone: [phone]   Fax:  [phone] ? ?Physical Therapy Evaluation ? ?Patient Details  ?Name: Barbara Malone ?MRN: [id number] ?Date of Birth: Sep 03, 1956 ?Referring Provider (PT): Fredric Dine, FNP (pain management) ? ? ?Encounter Date: 05/03/2022 ? ? PT End of Session - 05/03/22 1505   ? ? Visit Number 1   ? Number of Visits 12   ? Date for PT Re-Evaluation 06/14/22   ? Authorization Type [employer] Medicare, Medicaid Secondary   ? Authorization Time Period 05/03/22-06/14/22   ? Progress Note Due on Visit 10   ? PT Start Time 1430   ? PT Stop Time 1500   ? PT Time Calculation (min) 30 min   ? Activity Tolerance Patient tolerated treatment well;Patient limited by pain   ? Behavior During Therapy [facility] for tasks assessed/performed   ? ?  ?  ? ?  ? ? ?Past Medical History:  ?Diagnosis Date  ? Anxiety   ? Brain tumor ([place])   ? Cancer [facility]   ? breast, metastatic, active  ? Depression   ? still active; takes meds for depression;   ? Hyperlipidemia   ? controllled with medication;   ? Metastatic breast cancer ([place])   ? Peripheral neuropathy   ? ? ?Past Surgical History:  ?Procedure Laterality Date  ? CARPAL TUNNEL RELEASE    ? CESAREAN SECTION    ? TRIGGER FINGER RELEASE    ? ? ?There were no vitals filed for this visit. ? ? ? Subjective Assessment - 05/03/22 1431   ? ? Subjective Pt is referred to PT by her pain management doctor for 3 months on insidious LUE pain in brachium, antebrachium. Pt reports pain is worse after periods of sleeping or napping. She has pain with reaching behind her back for   ? Currently in Pain? Yes   ? Pain Score 3    ? Pain Location --   left upper arm, lower arm  ? ?  ?  ? ?  ? ? ? ? ? [facility] PT Assessment - 05/03/22 0001   ? ?  ? Assessment  ? Medical Diagnosis Left nonspecific brachial, anterbrachial pain   ? Referring Provider (PT) Fredric Dine, FNP   pain management  ? Onset Date/Surgical  Date --   ~ 3 months ago  ? Hand Dominance Right   ?  ? Precautions  ? Precautions None   History of breast cancer c malignancy  ?  ? Balance Screen  ? Has the patient fallen in the past 6 months No   ?  ? Prior Function  ? Level of Independence Independent   ? Leisure Pt is unable to participate in many activities that used to be leisure for her: fishing, water play, etc.   Reports her apartment area is boring and there is nothing much to do  ?  ? Observation/Other Assessments  ? Focus on Therapeutic Outcomes (FOTO)  42   ? ?  ?  ? ?  ? ?Shoulder ROM Assessment    ? Right (eval) Left (eval)   ?Flexion A/ROM 140 146*  ?ABDCT A/ROM  146 141*  ?External rotation T2 T2* (Lt triceps pain)  ?Internal Rotation  T5 T8* (whole brachial pain)  ?    ?Flexion P/ROM 140* 150*  ?ABDCT P/ROM 157* 135**  ?ER P/ROM 70 45** (elbow pain)   ?IR P/ROM 45 80  ? *=painful ? ?  Shoulder Strength Assessment   ? Right (eval) Left (eval)   ?Upper Trapezius     ?Shoulder Flexion 5/5 4+/5** pain near lateral elbow  ?Shoulder ABDCT 5/5 5/5  ?Shoulder ER  5/5 5/5  ?Shoulder IR 4+/5 4/5** significant lower arm to elbow pain  ?Posterior deltoid 5/5 5/5  ?Elbow flexion  7lb x3 reps 7lb x1 rep  ? ? ? ? ?Objective measurements completed on examination: See above findings.  ? ? ? PT Education - 05/03/22 1504   ? ? Education provided Yes   ? Education Details PT is unable to completely resolve pain, but can improve her function   ? Person(s) Educated Patient   ? Methods Explanation;Demonstration   ? Comprehension Verbalized understanding;Need further instruction   ? ?  ?  ? ?  ? ? ? PT Short Term Goals - 05/03/22 1511   ? ?  ? PT SHORT TERM GOAL #1  ? Title Pt will be independent with HEP for carryover between sessions   ? Baseline none   ? Time 4   ? Period Weeks   ? Status New   ? Target Date 05/31/22   ?  ? PT SHORT TERM GOAL #2  ? Title FOTO improvement >10 points   ? Baseline defer to visit 2   ? Time 4   ? Period Weeks   ? Status New   ? Target  Date 05/31/22   ? ?  ?  ? ?  ? ? ? ? PT Long Term Goals - 05/03/22 1512   ? ?  ? PT LONG TERM GOAL #1  ? Title 5/5 MMT without pain limitations   ? Time 6   ? Period Weeks   ? Status New   ? Target Date 06/14/22   ?  ? PT LONG TERM GOAL #2  ? Title Ability to reach into cabinets overhead with step stool as needed without aggravation of pain >4/10.   ? Time 6   ? Period Weeks   ? Status New   ? Target Date 06/14/22   ?  ? PT LONG TERM GOAL #3  ? Title Improved shoulder flexion ROM BUE >150 degrees bilat   ? Time 6   ? Period Weeks   ? Status New   ? Target Date 06/14/22   ? ?  ?  ? ?  ? ? ? ? ? ? ? ? ? Plan - 05/03/22 1506   ? ? Clinical Impression Statement Examination revealing of generalized weakness and hypomobility about bilat shoulders. Exam is unable to correlate any specific etiology at this time; pain appears capsular at times, other times radicular, however pain centric to elbow area and forearm region is quite uncommon. Addiitonal workup warranted. Pt will benefit from skilled PT intervention to address areas of weakness and hypomobility to maximize ability to perform ADL.   ? Personal Factors and Comorbidities Age;Behavior Pattern;Fitness;Past/Current Experience;Time since onset of injury/illness/exacerbation   ? Examination-Activity Limitations Carry;Lift;Transfers;Reach Overhead;Toileting;Dressing   ? Examination-Participation Restrictions Yard Work;Meal Prep;Driving;Community Activity;Cleaning   ? Stability/Clinical Decision Making Unstable/Unpredictable   ? Clinical Decision Making Moderate   ? Rehab Potential Poor   ? PT Frequency 2x / week   ? PT Duration 6 weeks   ? PT Treatment/Interventions ADLs/Self Care Home Management;Cryotherapy;Electrical Stimulation;Traction;Moist Heat;Ultrasound;DME Instruction;Gait Scientist, forensic;Therapeutic activities;Therapeutic exercise;Passive range of motion;Spinal Manipulations;Joint Manipulations;Functional mobility training;Balance training;Patient/family  education;Neuromuscular re-education;Manual techniques;Taping   ? PT Next Visit Plan Cervical and thoracic screening, ULTT; begin gentle strengthening  regimen for HEP   ? PT Home Exercise Plan defer to visit 2   ? Consulted and Agree with Plan of Care Patient   ? ?  ?  ? ?  ? ? ?Patient will benefit from skilled therapeutic intervention in order to improve the following deficits and impairments:  Decreased activity tolerance, Decreased endurance, Decreased balance, Decreased mobility, Decreased range of motion, Difficulty walking, Decreased strength, Hypomobility, Increased fascial restricitons, Impaired perceived functional ability, Impaired flexibility, Postural dysfunction, Impaired UE functional use, Improper body mechanics, Pain, Decreased coordination, Decreased knowledge of precautions ? ?Visit Diagnosis: ?Pain in left upper arm ? ?Muscle weakness (generalized) ? ?Stiffness of left shoulder, not elsewhere classified ? ? ? ? ?Problem List ?There are no problems to display for this patient. ? ?3:16 PM, 05/03/22 ?Etta Grandchild, PT, DPT ?Physical Therapist - [place] ?[phone] (Office) ? ? ?Indio Santilli C, PT ?05/03/2022, 3:16 PM ? ?[place] [facility] ?[address] ?[place], [place], [address] ?Phone: [phone]   Fax:  [phone] ? ?Name: Fannye Myer ?MRN: [id number] ?Date of Birth: 07/18/1956 ? ? ?

## 2022-05-05 ENCOUNTER — Ambulatory Visit
Admit: 2022-05-05 | Payer: MEDICARE | Attending: Student in an Organized Health Care Education/Training Program | Primary: Student in an Organized Health Care Education/Training Program

## 2022-05-06 NOTE — Addendum Note (Signed)
Addended by: Etta Grandchild on: 05/06/2022 09:19 AM ? ? Modules accepted: Orders ? ?

## 2022-05-09 MED ORDER — THIAMINE HCL (VITAMIN B1) 50 MG TABLET
ORAL_TABLET | Freq: Every day | ORAL | 1 refills | 90 days | Status: CP
Start: 2022-05-09 — End: 2022-11-05

## 2022-05-09 NOTE — Unmapped (Signed)
Last Visit Date: 09/10/2020  Next Visit Date: Visit date not found    No results found for: CBC, CMP     Results for orders placed or performed during the hospital encounter of 06/23/20   ECG 12 Lead   Result Value Ref Range    EKG Systolic BP  mmHg    EKG Diastolic BP  mmHg    EKG Ventricular Rate 80 BPM    EKG Atrial Rate 80 BPM    EKG P-R Interval 160 ms    EKG QRS Duration 74 ms    EKG Q-T Interval 414 ms    EKG QTC Calculation 477 ms    EKG Calculated P Axis 59 degrees    EKG Calculated R Axis 58 degrees    EKG Calculated T Axis 70 degrees    QTC Fredericia 456 ms

## 2022-05-10 ENCOUNTER — Encounter: Payer: Self-pay | Admitting: Physical Therapy

## 2022-05-10 ENCOUNTER — Ambulatory Visit: Payer: Medicare HMO | Admitting: Physical Therapy

## 2022-05-10 ENCOUNTER — Telehealth: Payer: Self-pay | Admitting: Physical Therapy

## 2022-05-10 DIAGNOSIS — M25612 Stiffness of left shoulder, not elsewhere classified: Secondary | ICD-10-CM

## 2022-05-10 DIAGNOSIS — M79622 Pain in left upper arm: Secondary | ICD-10-CM

## 2022-05-10 DIAGNOSIS — M6281 Muscle weakness (generalized): Secondary | ICD-10-CM

## 2022-05-10 NOTE — Unmapped (Addendum)
lvm- Message from Carlus Pavlov, CMA sent at 05/09/2022 10:12 AM EDT -----  Regarding: Appt  Good morning,  Please call patient and offer appt with Dr. Anner Crete. If no appt available please place recall.  Thank you.

## 2022-05-10 NOTE — Telephone Encounter (Signed)
Called pt to inquiry about absence. Pt thought her apt was later in day and said she would try to make apt despite not having full session.  ?

## 2022-05-10 NOTE — Therapy (Signed)
?OUTPATIENT PHYSICAL THERAPY TREATMENT NOTE ? ? ?Patient Name: Barbara Malone ?MRN: [id number] ?DOB:May 14, 1956, 66 y.o., female ?Today's Date: 05/10/2022 ? ?PCP: Quay Burow FNP  ?REFERRING PROVIDER: Quay Burow FNP  ? ? PT End of Session - 05/10/22 1405   ? ? Visit Number 2   ? Number of Visits 12   ? Date for PT Re-Evaluation 06/14/22   ? Authorization Type [employer] Medicare, Medicaid Secondary   ? Authorization Time Period 05/03/22-06/14/22   ? Authorization - Visit Number 2   ? Authorization - Number of Visits 16   ? Progress Note Due on Visit 10   ? PT Start Time 1400   ? PT Stop Time 6962   ? PT Time Calculation (min) 15 min   ? Activity Tolerance Patient tolerated treatment well;Patient limited by pain   ? Behavior During Therapy [facility] for tasks assessed/performed   ? ?  ?  ? ?  ? ? ?Past Medical History:  ?Diagnosis Date  ? Anxiety   ? Brain tumor ([place])   ? Cancer [facility])   ? breast, metastatic, active  ? Depression   ? still active; takes meds for depression;   ? Hyperlipidemia   ? controllled with medication;   ? Metastatic breast cancer   ? Peripheral neuropathy   ? ?Past Surgical History:  ?Procedure Laterality Date  ? CARPAL TUNNEL RELEASE    ? CESAREAN SECTION    ? TRIGGER FINGER RELEASE    ? ?There are no problems to display for this patient. ? ? ?REFERRING DIAG: Left Arm Pain  ? ?THERAPY DIAG:  ?Pain in left upper arm ? ?Muscle weakness (generalized) ? ?Stiffness of left shoulder, not elsewhere classified ? ?PERTINENT HISTORY:  ? ?Per 05/03/22 Ramond Dial PT, DPT note  ? ?Examination revealing of generalized weakness and hypomobility about bilat shoulders. Exam is unable to correlate any specific etiology at this time; pain appears capsular at times, other times radicular, however pain centric to elbow area and forearm region is quite uncommon. Addiitonal workup warranted. Pt will benefit from skilled PT intervention to address areas of weakness and hypomobility to maximize ability to perform ADL.   ? ?PRECAUTIONS: None ? ?SUBJECTIVE: Pt presents 30 minutes late to apt, because she mixed up time of apt. The left arm has continued to hurt her to the point where she cannot clean her own house or hangup her clothes.  ? ?PAIN:  ?Are you having pain? Yes: NPRS scale: 8/10 ?Pain location: Right ?Pain description: Dull, achy  ?Aggravating factors: Reaching overhead  ?Relieving factors: Holding it against her body  ? ? ?OBJECTIVE: (objective measures completed at initial evaluation unless otherwise dated) ? ? Balance Screen  ?  Has the patient fallen in the past 6 months No   ?     ?  Prior Function  ?  Level of Independence Independent   ?  Leisure Pt is unable to participate in many activities that used to be leisure for her: fishing, water play, etc.   Reports her apartment area is boring and there is nothing much to do  ?     ?  Observation/Other Assessments  ?  Focus on Therapeutic Outcomes (FOTO)  42   ?  ?   ?  ?  ?   ?  ?    ?Shoulder ROM Assessment     ?  Right (eval) Left (eval)   ?Flexion A/ROM 140 146*  ?ABDCT A/ROM  146 141*  ?External rotation T2  T2* (Lt triceps pain)  ?Internal Rotation  T5 T8* (whole brachial pain)  ?       ?Flexion P/ROM 140* 150*  ?ABDCT P/ROM 157* 135**  ?ER P/ROM 70 45** (elbow pain)   ?IR P/ROM 45 80  ? *=painful ?  ?    ?Shoulder Strength Assessment   ?  Right (eval) Left (eval)   ?Upper Trapezius       ?Shoulder Flexion 5/5 4+/5** pain near lateral elbow  ?Shoulder ABDCT 5/5 5/5  ?Shoulder ER  5/5 5/5  ?Shoulder IR 4+/5 4/5** significant lower arm to elbow pain  ?Posterior deltoid 5/5 5/5  ?Elbow flexion  7lb x3 reps 7lb x1 rep  ?  ? ? ?TODAY'S TREATMENT:  ?Shoulder AAROM Flexion/Extension 3 x 10  ?Shoulder AAROM Abduction/Adduction 3 x 10  ?Scapular Rows with Green TB 1 x 10  ?-Min VC to not drop elbows  ? ? ?PATIENT EDUCATION: ?Education details: Patient  ?Person educated: Patient ?Education method: Explanation, Demonstration, Verbal cues, and Handouts ?Education  comprehension: verbalized understanding, returned demonstration, tactile cues required, and needs further education ? ? ?HOME EXERCISE PROGRAM: ?Access Code: [email or web address] ?URL: [email or web address] ?Date: 05/10/2022 ?Prepared by: Bradly Chris ? ?Exercises ?- Scapular Retraction with Resistance  - 1 x daily - 3 x weekly - 3 sets - 10 reps ?- Seated Shoulder Flexion AAROM with Pulley Behind  - 1 x daily - 7 x weekly - 3 sets - 10 reps ?- Seated Shoulder Abduction AAROM with Pulley Behind  - 1 x daily - 7 x weekly - 3 sets - 10 reps ? ? PT Short Term Goals   ? ?  ? PT SHORT TERM GOAL #1  ? Title Pt will be independent with HEP for carryover between sessions   ? Baseline none   ? Time 4   ? Period Weeks   ? Status New   ? Target Date 05/31/22   ?  ? PT SHORT TERM GOAL #2  ? Title FOTO improvement >10 points   ? Baseline defer to visit 2   ? Time 4   ? Period Weeks   ? Status New   ? Target Date 05/31/22   ? ?  ?  ? ?  ? ? ? PT Long Term Goals   ? ?  ? PT LONG TERM GOAL #1  ? Title 5/5 MMT without pain limitations   ? Time 6   ? Period Weeks   ? Status New   ? Target Date 06/14/22   ?  ? PT LONG TERM GOAL #2  ? Title Ability to reach into cabinets overhead with step stool as needed without aggravation of pain >4/10.   ? Time 6   ? Period Weeks   ? Status New   ? Target Date 06/14/22   ?  ? PT LONG TERM GOAL #3  ? Title Improved shoulder flexion ROM BUE >150 degrees bilat   ? Time 6   ? Period Weeks   ? Status New   ? Target Date 06/14/22   ? ?  ?  ? ?  ? ? ? Plan  ? ? Clinical Impression Statement Pt able to perform all exercises despite increase in left shoulder pain that occured with flexion and abduction >=90 degrees. Unable to maximize session time because of only 15 min left in apt. PT reviewed upcoming apt times given that pt describes having difficulty seeing print out at home because of glaucoma. She will continue to  benefit from skilled PT to increase left shoulder ROM and strength and to decrease  left shoulder pain to return to completing UE ADLs such as routine cleaning and home care projects to remain independent.   ? Personal Factors and Comorbidities Age;Behavior Pattern;Fitness;Past/Current Experience;Time since onset of injury/illness/exacerbation   ? Comorbidities metastatic breast cancer, brain tumor   ? Examination-Activity Limitations Carry;Lift;Transfers;Reach Overhead;Toileting;Dressing   ? Examination-Participation Restrictions Yard Work;Meal Prep;Driving;Community Activity;Cleaning   ? Stability/Clinical Decision Making Unstable/Unpredictable   ? Rehab Potential Poor   ? PT Frequency 2x / week   ? PT Duration 6 weeks   ? PT Treatment/Interventions ADLs/Self Care Home Management;Cryotherapy;Electrical Stimulation;Traction;Moist Heat;Ultrasound;DME Instruction;Gait Scientist, forensic;Therapeutic activities;Therapeutic exercise;Passive range of motion;Spinal Manipulations;Joint Manipulations;Functional mobility training;Balance training;Patient/family education;Neuromuscular re-education;Manual techniques;Taping   ? PT Next Visit Plan Cervical and thoracic screening, ULTT; begin gentle strengthening regimen for HEP   ? PT Home Exercise Plan [email or web address]   ? Consulted and Agree with Plan of Care Patient   ? ?  ?  ? ?  ? ? ? ?Bradly Chris PT, DPT  ?Daneil Dan, PT ?05/10/2022, 5:42 PM ? ?  ? ? ?

## 2022-05-11 DIAGNOSIS — C50811 Malignant neoplasm of overlapping sites of right female breast: Principal | ICD-10-CM

## 2022-05-11 MED ORDER — TUCATINIB 50 MG TABLET
ORAL_TABLET | Freq: Two times a day (BID) | ORAL | 3 refills | 30 days | Status: CP
Start: 2022-05-11 — End: ?
  Filled 2022-05-19: qty 120, 30d supply, fill #0

## 2022-05-11 MED ORDER — TUCATINIB 150 MG TABLET
ORAL_TABLET | Freq: Two times a day (BID) | ORAL | 1 refills | 30 days | Status: CP
Start: 2022-05-11 — End: ?

## 2022-05-11 NOTE — Unmapped (Signed)
Take with 150 mg BID to make total dose of 250 mg BID.

## 2022-05-12 ENCOUNTER — Encounter: Payer: Self-pay | Admitting: Physical Therapy

## 2022-05-12 ENCOUNTER — Ambulatory Visit: Payer: Medicare HMO | Admitting: Physical Therapy

## 2022-05-12 DIAGNOSIS — M79622 Pain in left upper arm: Secondary | ICD-10-CM | POA: Diagnosis not present

## 2022-05-12 DIAGNOSIS — M6281 Muscle weakness (generalized): Secondary | ICD-10-CM

## 2022-05-12 DIAGNOSIS — M25612 Stiffness of left shoulder, not elsewhere classified: Secondary | ICD-10-CM

## 2022-05-12 NOTE — Therapy (Signed)
OUTPATIENT PHYSICAL THERAPY TREATMENT NOTE   Patient Name: Barbara Malone MRN: [id number] DOB:[birth year], 66 y.o., female Today's Date: 05/12/2022  PCP: Quay Burow FNP  REFERRING PROVIDER: Quay Burow FNP    PT End of Session - 05/12/22 1424     Visit Number 3    Number of Visits 12    Date for PT Re-Evaluation 06/14/22    Authorization Type [employer] Medicare, Medicaid Secondary    Authorization Time Period 05/03/22-06/14/22    Authorization - Visit Number 3    Authorization - Number of Visits 16    Progress Note Due on Visit 10    PT Start Time 1420    PT Stop Time 1500    PT Time Calculation (min) 40 min    Activity Tolerance Patient tolerated treatment well    Behavior During Therapy WFL for tasks assessed/performed             Past Medical History:  Diagnosis Date   Anxiety    Brain tumor ([place])    Cancer ([place])    breast, metastatic, active   Depression    still active; takes meds for depression;    Hyperlipidemia    controllled with medication;    Metastatic breast cancer    Peripheral neuropathy    Past Surgical History:  Procedure Laterality Date   CARPAL TUNNEL RELEASE     CESAREAN SECTION     TRIGGER FINGER RELEASE     There are no problems to display for this patient.   REFERRING DIAG: Left Arm Pain   THERAPY DIAG:  Pain in left upper arm  Muscle weakness (generalized)  Stiffness of left shoulder, not elsewhere classified  PERTINENT HISTORY:   Per 05/03/22 Ramond Dial PT, DPT note   Examination revealing of generalized weakness and hypomobility about bilat shoulders. Exam is unable to correlate any specific etiology at this time; pain appears capsular at times, other times radicular, however pain centric to elbow area and forearm region is quite uncommon. Addiitonal workup warranted. Pt will benefit from skilled PT intervention to address areas of weakness and hypomobility to maximize ability to perform ADL.   PRECAUTIONS: None  SUBJECTIVE: Pt  presents 30 minutes late to apt, because she mixed up time of apt. The left arm has continued to hurt her to the point where she cannot clean her own house or hangup her clothes.   PAIN:  Are you having pain? Yes: NPRS scale: 4/10 Pain location: Right and Left  Pain description: Dull, achy  Aggravating factors: Reaching overhead  Relieving factors: Holding it against her body    OBJECTIVE: (objective measures completed at initial evaluation unless otherwise dated)   Balance Screen    Has the patient fallen in the past 6 months No          Prior Function    Level of Independence Independent     Leisure Pt is unable to participate in many activities that used to be leisure for her: fishing, water play, etc.   Reports her apartment area is boring and there is nothing much to do         Observation/Other Assessments    Focus on Therapeutic Outcomes (FOTO)  42                     Shoulder ROM Assessment       Right (eval) Left (eval)   Flexion A/ROM 140 146*  ABDCT A/ROM  146 141*  External rotation T2  T2* (Lt triceps pain)  Internal Rotation  T5 T8* (whole brachial pain)         Flexion P/ROM 140* 150*  ABDCT P/ROM 157* 135**  ER P/ROM 70 45** (elbow pain)   IR P/ROM 45 80   *=painful       Shoulder Strength Assessment     Right (eval) Left (eval)   Upper Trapezius       Shoulder Flexion 5/5 4+/5** pain near lateral elbow  Shoulder ABDCT 5/5 5/5  Shoulder ER  5/5 5/5  Shoulder IR 4+/5 4/5** significant lower arm to elbow pain  Posterior deltoid 5/5 5/5  Elbow flexion  7lb x3 reps 7lb x1 rep      TODAY'S TREATMENT:   05/12/22  UBE Level 1 Resistance  Shoulder AAROM Flex/Ext 3 x 30   Shoulder AAROM Abd/Add 3 x 30   Shoulder IR/ER at 0 deg abduction with YTB 1 x 10  -Pt reports 8 to 9/10 for pain in bilateral shoulders   Supine Shoulder IR/ER at 0 deg Abduction 1 x 10  -Pt continues to report 8/10 in LLE   Shoulder horizontal abduction at 90 deg abduction 3  x 5    Shoulder flexion with LUE to 90 deg abduction 3 x 10  -mod VC to maintain elbow extension    05/10/22   Shoulder AAROM Flexion/Extension 3 x 10  Shoulder AAROM Abduction/Adduction 3 x 10  Scapular Rows with Green TB 1 x 10  -Min VC to not drop elbows    PATIENT EDUCATION: Education details: Patient  Person educated: Patient Education method: Consulting civil engineer, Media planner, Verbal cues, and Handouts Education comprehension: verbalized understanding, returned demonstration, tactile cues required, and needs further education   HOME EXERCISE PROGRAM: Access Code: [email or web address] URL: [email or web address] Date: 05/12/2022 Prepared by: Bradly Chris  Exercises - Seated Shoulder Abduction AAROM with Pulley Behind  - 1 x daily - 7 x weekly - 3 sets - 10 reps - Seated Shoulder Flexion AAROM with Pulley Behind  - 1 x daily - 7 x weekly - 3 sets - 10 reps - Scapular Retraction with Resistance  - 1 x daily - 3 x weekly - 3 sets - 10 reps - Shoulder Horizontal Abduction - Thumbs Up  - 1 x daily - 3 x weekly - 3 sets - 5 reps - Standing Shoulder Flexion with Resistance  - 1 x daily - 3 x weekly - 3 sets - 10 reps   PT Short Term Goals       PT SHORT TERM GOAL #1   Title Pt will be independent with HEP for carryover between sessions    Baseline none    Time 4    Period Weeks    Status New    Target Date 05/31/22      PT SHORT TERM GOAL #2   Title FOTO improvement >10 points    Baseline defer to visit 2    Time 4    Period Weeks    Status New    Target Date 05/31/22              PT Long Term Goals       PT LONG TERM GOAL #1   Title 5/5 MMT without pain limitations    Time 6    Period Weeks    Status New    Target Date 06/14/22      PT LONG TERM GOAL #2   Title Ability to reach into cabinets overhead with  step stool as needed without aggravation of pain >4/10.    Time 6    Period Weeks    Status New    Target Date 06/14/22      PT LONG TERM GOAL #3    Title Improved shoulder flexion ROM BUE >150 degrees bilat    Time 6    Period Weeks    Status New    Target Date 06/14/22              Plan    Clinical Impression Statement Pt able to perform all exercises with exception of shoulder ER at 0 deg abduction due to increased pain. She exhibits improved exercise tolerance with ability to perform shoulder flexion and parascapular strengthening and ROM exercises. Pt shows perseveration on pain with increased anxiety about her ability to rehab.  She will continue to benefit from skilled PT to increase left shoulder ROM and strength and to decrease left shoulder pain to return to completing UE ADLs such as routine cleaning and home care projects to remain independent.    Personal Factors and Comorbidities Age;Behavior Pattern;Fitness;Past/Current Experience;Time since onset of injury/illness/exacerbation    Comorbidities metastatic breast cancer, brain tumor    Examination-Activity Limitations Carry;Lift;Transfers;Reach Overhead;Toileting;Dressing    Examination-Participation Restrictions Yard Work;Meal Prep;Driving;Community Activity;Cleaning    Stability/Clinical Decision Making Unstable/Unpredictable    Rehab Potential Poor    PT Frequency 2x / week    PT Duration 6 weeks    PT Treatment/Interventions ADLs/Self Care Home Management;Cryotherapy;Electrical Stimulation;Traction;Moist Heat;Ultrasound;DME Instruction;Gait Scientist, forensic;Therapeutic activities;Therapeutic exercise;Passive range of motion;Spinal Manipulations;Joint Manipulations;Functional mobility training;Balance training;Patient/family education;Neuromuscular re-education;Manual techniques;Taping    PT Next Visit Plan Review carts to carry groceries. Cervical and thoracic screening, ULTT   PT Home Exercise Plan [email or web address]    Consulted and Agree with Plan of Care Patient              Bradly Chris PT, DPT  Daneil Dan, PT 05/12/2022, 2:25 PM

## 2022-05-13 NOTE — Unmapped (Signed)
Called patient and confirmed follow up appointment with Dr. Anner Crete is not needed. Also, patient states she is not taking Vitamin B-1.

## 2022-05-14 ENCOUNTER — Ambulatory Visit: Admit: 2022-05-14 | Discharge: 2022-05-14 | Disposition: A | Discharge status: Home / Self Care | Payer: MEDICARE

## 2022-05-14 DIAGNOSIS — K121 Other forms of stomatitis: Principal | ICD-10-CM

## 2022-05-14 DIAGNOSIS — K1230 Oral mucositis (ulcerative), unspecified: Principal | ICD-10-CM

## 2022-05-14 MED ORDER — MAGIC MOUTHWASH ORAL SUSPENSION
Freq: Four times a day (QID) | ORAL | 0 refills | 3.00000 days | Status: CP | PRN
Start: 2022-05-14 — End: 2022-05-14

## 2022-05-14 NOTE — Unmapped (Signed)
Pt stating she has a place at the top of her mouth that feels swollen. Pt stating she is unable to wear her partial because of the pain. Pt stating decreased intake because of the discomfort. Some mild edema noted behind patient's front teeth but no bleeding or open wounds. Pt has been on chemo for 6.5 years. Pt is a current smoker.

## 2022-05-14 NOTE — Unmapped (Signed)
[facility]  Emergency Department Provider Note        History     Chief Complaint  Mouth Pain      HPI   Brooke [name] is a 66 y.o. female with a past medical history of breast cancer on active chemotherapy, poor dental health who presents with mouth pain.  She reports acute onset of oral sores and ulceration that are painful.  She has struggled to eat due to the pain.  She tried to get in to see her oncologist and dentist regarding this but has been unable to get through to them.  No other areas affected.  No fever, chills, facial swelling, trismus.       Past Medical History:   Diagnosis Date    Abnormal ECG unsure    Alcoholism (CMS-HCC)     Allergic rhinitis     Anemia     Anxiety     insomnia    Arthritis not sure    Brain concussion     Breast cancer (CMS-HCC)     chemo for now with mets    COPD (chronic obstructive pulmonary disease) (CMS-HCC)     Depression     Dysphagia     Fatty liver 07/08/2021    Genitourinary disease     GERD (gastroesophageal reflux disease)     Headache     HTN (hypertension)     Hyperlipidemia     Hypothyroidism, unspecified type 01/21/2022    Insomnia     Peripheral neuropathy     due to chemo therapy    Stroke (CMS-HCC) Nov. 2020    Dr. 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)    Nausea    Closed fracture of one rib with routine healing    Diarrhea of presumed infectious origin    Failure to thrive in adult    Tobacco use    Chronic diarrhea    Depression    Hyponatremia    Back pain    Closed fracture of multiple pubic rami, right, initial encounter (CMS-HCC)    Macrocytosis    Pelvic fracture (CMS-HCC)    Primary malignant neoplasm of breast with metastasis (CMS-HCC)    Migraine without aura and without status migrainosus, not intractable    Essential hypertension    CVA (cerebral vascular accident) (CMS-HCC)    DDD (degenerative disc disease), cervical    Wheezing    Dyslipidemia    Angina pectoris (CMS-HCC)    Papillary fibroelastoma of heart    Antineoplastic chemotherapy induced anemia    Celiac disease    Iron deficiency anemia due to chronic blood loss    Dry eye syndrome, bilateral    Meibomian gland dysfunction (MGD) of both eyes    Glaucoma suspect of both eyes    Incipient cataract of both eyes    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)    Fatty liver    Hypokalemia    Hypothyroidism, unspecified type    Elevated TSH       Past Surgical History:   Procedure Laterality Date    CESAREAN SECTION      CHOLECYSTECTOMY      FINGER SURGERY Right     trigger finger  GANGLION CYST EXCISION Left     hand    HYSTERECTOMY      LYMPH NODE BIOPSY Right 02/08/2016    Axillary    PR BRNSCHSC TNDSC EBUS DX/TX INTERVENTION PERPH LES Left 03/05/2020    Procedure: Bronch, Rigid Or Flexible, Including Fluoro Guidance, When Performed; With Transendoscopic Ebus During Bronchoscopic Diagnostic Or Therapeutic Intervention(S) For Peripheral Lesion(S);  Surgeon: Jerelyn Charles, MD;  Location: MAIN OR [facility];  Service: Pulmonary    PR BRONCHOSCOPY,COMPUTER ASSIST/IMAGE-GUIDED NAVIGATION Left 03/05/2020    Procedure: BRONCHOSCOPY, RIGID OR FLEXIBLE, INCLUDE FLUORO WHEN PERFORMED; W/COMPUTER-ASSIST, IMAGE-GUIDED NAVIGATION;  Surgeon: Jerelyn Charles, MD;  Location: MAIN OR [facility];  Service: Pulmonary    PR BRONCHOSCOPY,DIAGNOSTIC W LAVAGE Left 03/05/2020    Procedure: Bronchoscopy, Rigid Or Flexible, Include Fluoroscopic Guidance When Performed; W/Bronchial Alveolar Lavage;  Surgeon: Jerelyn Charles, MD;  Location: MAIN OR [place];  Service: Pulmonary    PR BRONCHOSCOPY,TRANSBRON ASPIR BX Left 03/05/2020    Procedure: Bronchoscopy, Rigid/Flex, Incl Fluoro; W/Transbronch Ndl Aspirat Bx, Trachea, Main Stem &/Or Lobar Bronchus;  Surgeon: Jerelyn Charles, MD;  Location: MAIN OR [place];  Service: Pulmonary    PR BRONCHOSCOPY,TRANSBRONCH BIOPSY Left 03/05/2020    Procedure: Bronchoscopy, Rigid/Flexible, Include Fluoro Guidance When Performed; W/Transbronchial Lung Bx, Single Lobe;  Surgeon: Jerelyn Charles, MD;  Location: MAIN OR [facility];  Service: Pulmonary    PR CATH PLACE/CORON ANGIO, IMG SUPER/INTERP,W LEFT HEART VENTRICULOGRAPHY N/A 05/07/2020    Procedure: Left Heart Catheterization;  Surgeon: Lesle Reek, MD;  Location: [facility] CATH;  Service: Cardiology    PR COLONOSCOPY W/BIOPSY SINGLE/MULTIPLE N/A 10/21/2016    Procedure: COLONOSCOPY, FLEXIBLE, PROXIMAL TO SPLENIC FLEXURE; WITH BIOPSY, SINGLE OR MULTIPLE;  Surgeon: Monte Fantasia, MD;  Location: GI PROCEDURES [facility];  Service: Gastroenterology    PR EXCIS SUPRATENT BRAIN TUMOR Right 01/15/2019    Procedure: CRANIECTOMY; EXC BRAIN TUMOR-SUPRATENTORIAL;  Surgeon: Edison Simon, MD;  Location: MA[facility] OR [facility];  Service: Neurosurgery    PR INCISE FINGER TENDON SHEATH Left 06/17/2019    Procedure: R-20 TENDON SHEATH INCISION (EG, FOR TRIGGER FINGER);  Surgeon: Daisy Lazar, MD;  Location: ASC OR [facility];  Service: Orthopedics    PR MICROSURG TECHNIQUES,REQ OPER MICROSCOPE Right 01/15/2019    Procedure: MICROSURGICAL TECHNIQUES, REQUIRING USE OF OPERATING MICROSCOPE (LIST SEPARATELY IN ADDITION TO CODE FOR PRIMARY PROCEDURE);  Surgeon: Edison Simon, MD;  Location: MAIN OR [place];  Service: Neurosurgery    PR STEREOTACTIC COMP ASSIST PROC,CRANIAL,INTRADURAL Right 01/15/2019    Procedure: STEREOTACTIC COMPUTER-ASSISTED (NAVIGATIONAL) PROCEDURE; CRANIAL, INTRADURAL;  Surgeon: Edison Simon, MD;  Location: MAIN OR [facility];  Service: Neurosurgery    PR UPPER GI ENDOSCOPY,BIOPSY N/A 10/21/2016    Procedure: UGI ENDOSCOPY; WITH BIOPSY, SINGLE OR MULTIPLE;  Surgeon: Monte Fantasia, MD;  Location: [facility] PROCEDURES [facility];  Service: Gastroenterology    PR UPPER GI ENDOSCOPY,BIOPSY N/A 04/02/2018    Procedure: UGI ENDOSCOPY; WITH BIOPSY, SINGLE OR MULTIPLE;  Surgeon: Wendall Papa, MD;  [facility]: [facility];  Service: Gastroenterology    SKIN BIOPSY         No current facility-administered medications for this encounter.    Current Outpatient Medications:     albuterol HFA 90 mcg/actuation inhaler, Inhale 2 puffs every six (6) hours as needed for wheezing., Disp: , Rfl:     aluminum-magnesium hydroxide-simethicone 200-200-20 mg/5 mL Susp 80 mL, diphenhydrAMINE 12.5 mg/5 mL Liqd 200 mg, nystatin 100,000 unit/mL Susp 8,000,000 Units, distilled water  Liqd 80 mL, lidocaine 2% viscous 2 % Soln 80 mL, Take 5 mL by mouth every six (6) hours as needed. Swish, gargle, spit or swallow if throat issues, Disp: 80 mL, Rfl: 2    capecitabine (XELODA) 500 MG tablet, Take 1 tablet (500 mg total) by mouth two (2) times a day, continuously. To be taken every day with no breaks, Disp: 60 tablet, Rfl: 5    clobetasoL (TEMOVATE) 0.05 % ointment, Apply twice a day to rash on palm until smooth/clear., Disp: 30 g, Rfl: 1    clonazePAM (KLONOPIN) 0.5 MG tablet, Take 0.5 mg daily as needed for anxiety.  Do not exceed 0.5 mg/day., Disp: 30 tablet, Rfl: 0    diclofenac sodium (VOLTAREN) 1 % gel, Apply 2 g topically four (4) times a day as needed for arthritis or pain., Disp: 150 g, Rfl: 2    diphenoxylate-atropine (LOMOTIL) 2.5-0.025 mg per tablet, Take 1 tablet by mouth four (4) times a day as needed for diarrhea., Disp: 30 tablet, Rfl: 1    esomeprazole (NEXIUM) 40 MG capsule, Take 1 capsule (40 mg total) by mouth in the morning., Disp: 90 capsule, Rfl: 3    FARXIGA 5 mg Tab tablet, Take 1 tablet (5 mg total) by mouth daily., Disp: , Rfl:     HYDROcodone-acetaminophen (NORCO 10-325) 10-325 mg per tablet, Take 1 tablet by mouth every twelve (12) hours as needed for pain., Disp: 60 tablet, Rfl: 0    levothyroxine (SYNTHROID) 25 MCG tablet, Take 1 tablet (25 mcg total) by mouth daily., Disp: 30 tablet, Rfl: 11    lidocaine (LIDODERM) 5 % patch, Place 1 patch on the skin daily. Apply to affected area for 12 hours only each day (then remove patch), Disp: 30 patch, Rfl: 3    lisinopriL-hydrochlorothiazide (PRINZIDE,ZESTORETIC) 20-25 mg per tablet, Take 1 tablet by mouth daily., Disp: 90 tablet, Rfl: 3    magic mouthwash, Take 10 mL by mouth four (4) times a day as needed (Pain)., Disp: 120 mL, Rfl: 0    MAGNESIUM ORAL, Take by mouth daily. OTC (Patient not taking: Reported on 02/04/2022), Disp: , Rfl:     mirtazapine (REMERON) 7.5 MG tablet, Take 1 tablet (7.5 mg total) by mouth nightly., Disp: 30 tablet, Rfl: 0    naloxone (NARCAN) 4 mg nasal spray, One spray in either nostril once for known/suspected opioid overdose. May repeat every 2-3 minutes in alternating nostril til EMS arrives (Patient not taking: Reported on 11/11/2021), Disp: 2 each, Rfl: 0    ondansetron (ZOFRAN) 4 MG tablet, Take 1 tablet (4 mg total) by mouth every six (6) hours as needed for nausea. (Patient not taking: Reported on 04/08/2022), Disp: 30 tablet, Rfl: 1    potassium chloride 10 MEQ ER tablet, Take 2 tablets (20 mEq total) by mouth Two (2) times a day., Disp: 360 tablet, Rfl: 3    pregabalin (LYRICA) 200 MG capsule, Take 1 capsule (200 mg total) by mouth Two (2) times a day., Disp: 60 capsule, Rfl: 0    prochlorperazine (COMPAZINE) 10 MG tablet, Take 1 tablet (10 mg total) by mouth every six (6) hours as needed for nausea. (Patient not taking: Reported on 04/08/2022), Disp: 30 tablet, Rfl: 6    senna (SENOKOT) 8.6 mg tablet, Take 3 tablets by mouth in the morning., Disp: 30 tablet, Rfl: 2    thiamine (VITAMIN B-1) 50 MG tablet, Take 1 tablet (50 mg total) by mouth in the morning., Disp: 90 tablet, Rfl: 1  traZODone (DESYREL) 50 MG tablet, Take 1.5 tablets (75 mg total) by mouth nightly., Disp: 45 tablet, Rfl: 1    tucatinib (TUKYSA) 150 mg tablet, Take 1 tablet (150 mg total) by mouth two (2) times a day., Disp: 60 tablet, Rfl: 1    tucatinib (TUKYSA) 50 mg tablet, Take 2 tablets (100 mg total) by mouth Two (2) times a day ., Disp: 120 tablet, Rfl: 3    venlafaxine (EFFEXOR-XR) 150 MG 24 hr capsule, Take 2 capsules (300 mg total) by mouth daily., Disp: 60 capsule, Rfl: 5    Allergies  Adhesive; Decadron [dexamethasone]; Suboxone [buprenorphine-naloxone]; Tetracycline; Oxycodone; Skin protectants, misc.; and Tegaderm adhesive-no drug-allergy check    Family History   Problem Relation Age of Onset    Cancer Father     Heart failure Father     Heart attack Father     Diabetes Sister     Heart failure Sister     Heart attack Sister     Heart disease Sister     Hypertension Sister     Heart failure Brother     Heart attack Brother     Heart disease Brother     Hypertension Brother     Cancer Maternal Uncle     Cancer Maternal Grandfather         lung cancer    COPD Mother     Hypertension Mother     No Known Problems Maternal Aunt     No Known Problems Paternal Aunt     No Known Problems Paternal Uncle     Heart disease Maternal Grandmother         mini strokes    No Known Problems Paternal Grandmother     No Known Problems Paternal Grandfather     No Known Problems Other     Cancer Maternal Uncle         Lung Cancer    Cancer Paternal Aunt         Breast Cancer    Diabetes Sister         Type 2    Hypertension Sister     Diabetes Sister         not sure what type    Hypertension Sister     Hypertension Sister     Anesthesia problems Neg Hx     Broken bones Neg Hx     Clotting disorder Neg Hx     Collagen disease Neg Hx     Dislocations Neg Hx     Fibromyalgia Neg Hx     Gout Neg Hx     Hemophilia Neg Hx     Osteoporosis Neg Hx     Rheumatologic disease Neg Hx     Scoliosis Neg Hx     Severe sprains Neg Hx     Sickle cell anemia Neg Hx     Spinal Compression Fracture Neg Hx        Social History  Social History     Tobacco Use    Smoking status: Every Day     Packs/day: 1.00     Years: 40.00     Pack years: 40.00     Types: Cigarettes    Smokeless tobacco: Never    Tobacco comments:     10-20 single    Vaping Use    Vaping Use: Never used   Substance Use Topics    Alcohol use: Yes     Comment: glass of wine  at night    Drug use: Not Currently       Review of Systems    A complete review of systems was performed and is negative other than as addressed in the HPI.    Physical Exam     ED Triage Vitals [05/14/22 1248]   Enc Vitals Group      BP 137/85      Heart Rate 74      SpO2 Pulse       Resp 18      Temp 36.8 ??C (98.2 ??F)      Temp Source Oral      SpO2 98 %      Weight 50.5 kg (111 lb 6.4 oz)      Height       Head Circumference       Peak Flow       Pain Score       Pain Loc       Pain Edu?       Excl. in GC?        Constitutional: Alert and oriented. Well appearing and in no distress.  Eyes: No scleral icterus.  ENT       Head: Normocephalic and atraumatic.       Mouth/Throat: Mucous membranes are moist. Shallow areas of erythema and ulceration noted around gingiva of upper left teeth and hard palate.  No vesicles.        Neck: Supple  Cardiovascular: Normal rate, extremities well perfused.  Respiratory: Normal respiratory effort. Good tidal volume.   Gastrointestinal: Soft and non-distended.   Genitourinary: Bladder is non-distended.   Musculoskeletal: Patient freely moves all extremities. No warm or swollen joints.   Neurologic: Normal speech and language. No gross focal neurologic deficits are appreciated.  Skin: Skin is warm, dry and intact. No rash noted.  Psychiatric: Mood and affect are normal. Speech and behavior are normal.      Radiology     No orders to display         Labs         Pertinent labs & imaging results that were available during my care of the patient were reviewed by me and considered in my medical decision making (see chart for details).      ED Clinical Impression     1. Stomatitis and mucositis          ED Course, Assessment and Plan     This presentation is consistent with stomatitis- most likely due to her chemotherapeutic agent. Will start magic mouthwash and have her follow up with her dentist and oncologist for further evaluation and management as indicated.  No complications appreciated.  Pain improved with application of bupivacaine here today.      Medications - No data to display      Discharge Medications:  Current Discharge Medication List        START taking these medications    Details   magic mouthwash Take 10 mL by mouth four (4) times a day as needed (Pain).  Qty: 120 mL, Refills: 0              ____________________________________________  Disposition: discharge home.     After careful consideration of Brooke [name] presentation and clinical course, there does not appear to be an indication for further emergent evaluation or intervention, nor is there an indication for admission to the hospital.  At the time of discharge I do not believe, to  the best of my medical judgment, that an emergancy medical condition exists.  Brooke [name] is discharged home in stable and satisfactory condition.  Discharge diagnosis, instructions and plan were discussed and understood.  Signs and symptoms that should prompt re-evaluation in the emergency department were discussed and understood.  Both verbal and written discharge instructions were provided.      Additional Medical Decision Making     I have reviewed the vital signs and the nursing notes. Labs and radiology results that were available during my care of the patient were independently reviewed by me and considered in my medical decision making.        I have reviewed the triage vital signs and the nursing notes.       Helyn App, [place]  05/14/22 1435

## 2022-05-17 ENCOUNTER — Ambulatory Visit: Payer: Medicare HMO | Admitting: Physical Therapy

## 2022-05-17 DIAGNOSIS — F5101 Primary insomnia: Principal | ICD-10-CM

## 2022-05-17 DIAGNOSIS — G620 Drug-induced polyneuropathy: Principal | ICD-10-CM

## 2022-05-17 DIAGNOSIS — R634 Abnormal weight loss: Principal | ICD-10-CM

## 2022-05-17 DIAGNOSIS — T451X5A Adverse effect of antineoplastic and immunosuppressive drugs, initial encounter: Principal | ICD-10-CM

## 2022-05-17 MED ORDER — TRAZODONE 50 MG TABLET
ORAL_TABLET | Freq: Every evening | ORAL | 0 refills | 30 days | Status: CP
Start: 2022-05-17 — End: ?

## 2022-05-17 MED ORDER — PREGABALIN 200 MG CAPSULE
ORAL_CAPSULE | Freq: Two times a day (BID) | ORAL | 0 refills | 30.00000 days | Status: CP
Start: 2022-05-17 — End: ?

## 2022-05-17 MED ORDER — MIRTAZAPINE 7.5 MG TABLET
ORAL_TABLET | Freq: Every evening | ORAL | 0 refills | 30 days | Status: CP
Start: 2022-05-17 — End: ?

## 2022-05-17 NOTE — Unmapped (Signed)
Please refill if appropriate.     Most Recent Clinic Visit:Visit date not found  Next Clinic Visit: 05/30/2022

## 2022-05-18 NOTE — Unmapped (Signed)
[facility] Clinical Assessment & Refill Coordination Note    JHANIA [name], DOB: 1956-10-19  Phone: [phone] (home)     All above HIPAA information was verified with patient.     Was a Nurse, learning disability used for this call? No    Specialty Medication(s):   Hematology/Oncology: Tukysa 150 and 50mg  and Capecitabine 500mg , directions: 1000mg  2 times a day     Current Outpatient Medications   Medication Sig Dispense Refill    albuterol HFA 90 mcg/actuation inhaler Inhale 2 puffs every six (6) hours as needed for wheezing.      aluminum-magnesium hydroxide-simethicone 200-200-20 mg/5 mL Susp 80 mL, diphenhydrAMINE 12.5 mg/5 mL Liqd 200 mg, nystatin 100,000 unit/mL Susp 8,000,000 Units, distilled water Liqd 80 mL, lidocaine 2% viscous 2 % Soln 80 mL Take 5 mL by mouth every six (6) hours as needed. Swish, gargle, spit or swallow if throat issues 80 mL 2    capecitabine (XELODA) 500 MG tablet Take 1 tablet (500 mg total) by mouth two (2) times a day, continuously. To be taken every day with no breaks 60 tablet 5    clobetasoL (TEMOVATE) 0.05 % ointment Apply twice a day to rash on palm until smooth/clear. 30 g 1    clonazePAM (KLONOPIN) 0.5 MG tablet Take 0.5 mg daily as needed for anxiety.  Do not exceed 0.5 mg/day. 30 tablet 0    diclofenac sodium (VOLTAREN) 1 % gel Apply 2 g topically four (4) times a day as needed for arthritis or pain. 150 g 2    diphenoxylate-atropine (LOMOTIL) 2.5-0.025 mg per tablet Take 1 tablet by mouth four (4) times a day as needed for diarrhea. 30 tablet 1    esomeprazole (NEXIUM) 40 MG capsule Take 1 capsule (40 mg total) by mouth in the morning. 90 capsule 3    FARXIGA 5 mg Tab tablet Take 1 tablet (5 mg total) by mouth daily.      HYDROcodone-acetaminophen (NORCO 10-325) 10-325 mg per tablet Take 1 tablet by mouth every twelve (12) hours as needed for pain. 60 tablet 0    levothyroxine (SYNTHROID) 25 MCG tablet Take 1 tablet (25 mcg total) by mouth daily. 30 tablet 11    lidocaine (LIDODERM) 5 % patch Place 1 patch on the skin daily. Apply to affected area for 12 hours only each day (then remove patch) 30 patch 3    lisinopriL-hydrochlorothiazide (PRINZIDE,ZESTORETIC) 20-25 mg per tablet Take 1 tablet by mouth daily. 90 tablet 3    magic mouthwash Take 10 mL by mouth four (4) times a day as needed (Pain). 120 mL 0    MAGNESIUM ORAL Take by mouth daily. OTC (Patient not taking: Reported on 02/04/2022)      mirtazapine (REMERON) 7.5 MG tablet Take 1 tablet (7.5 mg total) by mouth nightly. 30 tablet 0    naloxone (NARCAN) 4 mg nasal spray One spray in either nostril once for known/suspected opioid overdose. May repeat every 2-3 minutes in alternating nostril til EMS arrives (Patient not taking: Reported on 11/11/2021) 2 each 0    ondansetron (ZOFRAN) 4 MG tablet Take 1 tablet (4 mg total) by mouth every six (6) hours as needed for nausea. (Patient not taking: Reported on 04/08/2022) 30 tablet 1    potassium chloride 10 MEQ ER tablet Take 2 tablets (20 mEq total) by mouth Two (2) times a day. 360 tablet 3    pregabalin (LYRICA) 200 MG capsule Take 1 capsule (200 mg total)  by mouth Two (2) times a day. 60 capsule 2    prochlorperazine (COMPAZINE) 10 MG tablet Take 1 tablet (10 mg total) by mouth every six (6) hours as needed for nausea. (Patient not taking: Reported on 04/08/2022) 30 tablet 6    senna (SENOKOT) 8.6 mg tablet Take 3 tablets by mouth in the morning. 30 tablet 2    thiamine (VITAMIN B-1) 50 MG tablet Take 1 tablet (50 mg total) by mouth in the morning. 90 tablet 1    traZODone (DESYREL) 50 MG tablet Take 1.5 tablets (75 mg total) by mouth nightly. 45 tablet 0    tucatinib (TUKYSA) 150 mg tablet Take 1 tablet (150 mg total) by mouth two (2) times a day. 60 tablet 1    tucatinib (TUKYSA) 50 mg tablet Take 2 tablets (100 mg total) by mouth Two (2) times a day . 120 tablet 3    venlafaxine (EFFEXOR-XR) 150 MG 24 hr capsule Take 2 capsules (300 mg total) by mouth daily. 60 capsule 5     No current facility-administered medications for this visit.        Changes to medications: [name] reports no changes at this time.    Allergies   Allergen Reactions    Adhesive Rash    Decadron [Dexamethasone] Anxiety     Mania as well    Suboxone [Buprenorphine-Naloxone] Hallucinations    Tetracycline      Other reaction(s): Other (See Comments)    Oxycodone      Night terrors    Skin Protectants, Misc. Rash    Tegaderm Adhesive-No Drug-Allergy Check Rash       Changes to allergies: No    SPECIALTY MEDICATION ADHERENCE     capecitabine 500 mg: 3 days of medicine on hand   tukysa 150 mg: 3 days of medicine on hand   tukysa 50 mg: 3 days of medicine on hand     Medication Adherence    Patient reported X missed doses in the last month: 0  Specialty Medication: Capecitabine  Patient is on additional specialty medications: Yes  Additional Specialty Medications: [name]  Patient Reported Additional Medication X Missed Doses in the Last Month: 0          Specialty medication(s) dose(s) confirmed: Regimen is correct and unchanged.     Are there any concerns with adherence? No    Adherence counseling provided? Not needed    CLINICAL MANAGEMENT AND INTERVENTION      Clinical Benefit Assessment:    Do you feel the medicine is effective or helping your condition? Yes    Clinical Benefit counseling provided? Not needed    Adverse Effects Assessment:    Are you experiencing any side effects? No    Are you experiencing difficulty administering your medicine? No    Quality of Life Assessment:    Quality of Life      Oncology  1. What impact has your specialty medication had on the reduction of your daily pain or discomfort level?: None  2. On a scale of 1-10, how would you rate your ability to manage side effects associated with your specialty medication? (1=no issues, 10 = unable to take medication due to side effects): 1            How many days over the past month did your condition/medication  keep you from your normal activities? For example, brushing your teeth or getting up in the morning. 0    Have you discussed this  with your provider? Not needed    Acute Infection Status:    Acute infections noted within Epic:  No active infections  Patient reported infection: None    Therapy Appropriateness:    Is therapy appropriate and patient progressing towards therapeutic goals? Yes, therapy is appropriate and should be continued    DISEASE/MEDICATION-SPECIFIC INFORMATION      N/A    PATIENT SPECIFIC NEEDS     Does the patient have any physical, cognitive, or cultural barriers? No    Is the patient high risk? Yes, patient is taking oral chemotherapy. Appropriateness of therapy as been assessed    Does the patient require a Care Management Plan? No         SHIPPING     Specialty Medication(s) to be Shipped:   Hematology/Oncology: [name] 150 and 50mg  and Capecitabine 500mg , directions: 1000mg  2 times a day    Other medication(s) to be shipped: No additional medications requested for fill at this time     Changes to insurance: No    Delivery Scheduled: Yes, Expected medication delivery date: 05/20/22.     Medication will be delivered via Next Day Courier [place] the confirmed [place] [facility].    The patient will receive a drug information handout for each medication shipped and additional FDA Medication Guides as required.  Verified that patient has previously received a Conservation officer, historic buildings and a Surveyor, mining.    The patient or caregiver noted above participated in the development of this care plan and knows that they can request review of or adjustments to the care plan at any time.      All of the patient's questions and concerns have been addressed.    Rollen Sox   [facility] Specialty Pharmacist

## 2022-05-19 MED FILL — CAPECITABINE 500 MG TABLET: ORAL | 30 days supply | Qty: 60 | Fill #3

## 2022-05-19 MED FILL — TUKYSA 150 MG TABLET: ORAL | 30 days supply | Qty: 60 | Fill #0

## 2022-05-20 ENCOUNTER — Ambulatory Visit: Admit: 2022-05-20 | Discharge: 2022-05-21 | Payer: MEDICARE

## 2022-05-20 DIAGNOSIS — C50811 Malignant neoplasm of overlapping sites of right female breast: Principal | ICD-10-CM

## 2022-05-20 DIAGNOSIS — R7989 Other specified abnormal findings of blood chemistry: Principal | ICD-10-CM

## 2022-05-20 DIAGNOSIS — C7931 Secondary malignant neoplasm of brain: Principal | ICD-10-CM

## 2022-05-20 DIAGNOSIS — C50919 Malignant neoplasm of unspecified site of unspecified female breast: Principal | ICD-10-CM

## 2022-05-20 DIAGNOSIS — E039 Hypothyroidism, unspecified: Principal | ICD-10-CM

## 2022-05-20 DIAGNOSIS — R110 Nausea: Principal | ICD-10-CM

## 2022-05-20 DIAGNOSIS — E876 Hypokalemia: Principal | ICD-10-CM

## 2022-05-20 LAB — CBC W/ AUTO DIFF
BASOPHILS ABSOLUTE COUNT: 0 10*9/L (ref 0.0–0.1)
BASOPHILS RELATIVE PERCENT: 0.7 %
EOSINOPHILS ABSOLUTE COUNT: 0.1 10*9/L (ref 0.0–0.5)
EOSINOPHILS RELATIVE PERCENT: 0.9 %
HEMATOCRIT: 42 % (ref 34.0–44.0)
HEMOGLOBIN: 14.7 g/dL (ref 11.3–14.9)
LYMPHOCYTES ABSOLUTE COUNT: 2.3 10*9/L (ref 1.1–3.6)
LYMPHOCYTES RELATIVE PERCENT: 37.8 %
MEAN CORPUSCULAR HEMOGLOBIN CONC: 35.1 g/dL (ref 32.0–36.0)
MEAN CORPUSCULAR HEMOGLOBIN: 41.4 pg — ABNORMAL HIGH (ref 25.9–32.4)
MEAN CORPUSCULAR VOLUME: 117.9 fL — ABNORMAL HIGH (ref 77.6–95.7)
MEAN PLATELET VOLUME: 8.2 fL (ref 6.8–10.7)
MONOCYTES ABSOLUTE COUNT: 0.5 10*9/L (ref 0.3–0.8)
MONOCYTES RELATIVE PERCENT: 8.5 %
NEUTROPHILS ABSOLUTE COUNT: 3.2 10*9/L (ref 1.8–7.8)
NEUTROPHILS RELATIVE PERCENT: 52.1 %
NUCLEATED RED BLOOD CELLS: 0 /100{WBCs} (ref <=4)
PLATELET COUNT: 193 10*9/L (ref 150–450)
RED BLOOD CELL COUNT: 3.56 10*12/L — ABNORMAL LOW (ref 3.95–5.13)
RED CELL DISTRIBUTION WIDTH: 15.7 % — ABNORMAL HIGH (ref 12.2–15.2)
WBC ADJUSTED: 6.2 10*9/L (ref 3.6–11.2)

## 2022-05-20 LAB — COMPREHENSIVE METABOLIC PANEL
ALBUMIN: 3 g/dL — ABNORMAL LOW (ref 3.4–5.0)
ALKALINE PHOSPHATASE: 133 U/L — ABNORMAL HIGH (ref 46–116)
ALT (SGPT): 11 U/L (ref 10–49)
ANION GAP: 6 mmol/L (ref 5–14)
AST (SGOT): 33 U/L (ref <=34)
BILIRUBIN TOTAL: 0.4 mg/dL (ref 0.3–1.2)
BLOOD UREA NITROGEN: 6 mg/dL — ABNORMAL LOW (ref 9–23)
BUN / CREAT RATIO: 9
CALCIUM: 6.9 mg/dL — ABNORMAL LOW (ref 8.7–10.4)
CHLORIDE: 118 mmol/L — ABNORMAL HIGH (ref 98–107)
CO2: 20.8 mmol/L (ref 20.0–31.0)
CREATININE: 0.66 mg/dL
EGFR CKD-EPI (2021) FEMALE: >90 mL/min/{1.73_m2} (ref >=60)
GLUCOSE RANDOM: 84 mg/dL (ref 70–179)
POTASSIUM: 3.1 mmol/L — ABNORMAL LOW (ref 3.4–4.8)
PROTEIN TOTAL: 5.5 g/dL — ABNORMAL LOW (ref 5.7–8.2)
SODIUM: 145 mmol/L (ref 135–145)

## 2022-05-20 LAB — TSH: THYROID STIMULATING HORMONE: 2.167 u[IU]/mL (ref 0.550–4.780)

## 2022-05-20 LAB — MAGNESIUM: MAGNESIUM: 1.3 mg/dL — ABNORMAL LOW (ref 1.6–2.6)

## 2022-05-20 LAB — T4, FREE: FREE T4: 0.6 ng/dL — ABNORMAL LOW (ref 0.89–1.76)

## 2022-05-20 MED ADMIN — sodium chloride (NS) 0.9 % infusion: 100 mL/h | INTRAVENOUS | @ 17:00:00 | Stop: 2022-05-20

## 2022-05-20 MED ADMIN — heparin, porcine (PF) 100 unit/mL injection 500 Units: 500 [IU] | INTRAVENOUS | @ 18:00:00 | Stop: 2022-05-20

## 2022-05-20 MED ADMIN — magnesium oxide (MAG-OX) tablet 400 mg: 400 mg | ORAL | @ 17:00:00 | Stop: 2022-05-20

## 2022-05-20 MED ADMIN — trastuzumab (HERCEPTIN) 300 mg in sodium chloride (NS) 0.9 % 250 mL IVPB: 6 mg/kg | INTRAVENOUS | @ 18:00:00 | Stop: 2022-05-20

## 2022-05-20 MED ADMIN — potassium chloride 20 mEq in 100 mL IVPB Premix: 20 meq | INTRAVENOUS | @ 17:00:00 | Stop: 2022-05-20

## 2022-05-20 NOTE — Unmapped (Signed)
Labs resulted with corrected calcium ~7.2  and mag 1.3. I called patient and asked that she restart her calcium supplements ( aim for 1200/day) and her mag supplements. She agrees and voices understanding and thanks.  ECD

## 2022-05-20 NOTE — Unmapped (Signed)
Patient arrived in the infusion clinic at 1200.  Weight and Vitals were obtained. Port was accessed, flushed with blood return, dressing clean, dry, and intact.  Labs were drawn and resulted. No treatment parameters or premedications were ordered per treatment plan. Patient received Trastuzumab chemotherapy regiment as ordered without complications while in the clinic. Patient with low potassium, magnesium and calcium; clinic provider and Dr. [name] notified. Patient took 400mg  magnesium replacement. Patient received roughly of potassium IV infusion ( bag). Patient refused oral potassium replacement and refused to stay to complete IV potassium replacement. Dr. [name] called and spoke with patient about additional medications for home. Port was flushed with blood return, heparin locked, then de-accessed area covered with 2x2 gauze and ban-aid.  Patient received printed after visit summary then discharged home to self care.

## 2022-05-20 NOTE — Unmapped (Signed)
Lab Results   Component Value Date    WBC 6.2 05/20/2022    HGB 14.7 05/20/2022    HCT 42.0 05/20/2022    PLT 193 05/20/2022       Lab Results   Component Value Date    NA 145 05/20/2022    K 3.1 (L) 05/20/2022    CL 118 (H) 05/20/2022    CO2 20.8 05/20/2022    BUN 6 (L) 05/20/2022    CREATININE 0.66 05/20/2022    GLU 84 05/20/2022    CALCIUM 6.9 (L) 05/20/2022    MG 1.3 (L) 05/20/2022    PHOS 3.6 10/08/2020       Lab Results   Component Value Date    BILITOT 0.4 05/20/2022    BILIDIR 0.30 03/29/2021    PROT 5.5 (L) 05/20/2022    ALBUMIN 3.0 (L) 05/20/2022    ALT 11 05/20/2022    AST 33 05/20/2022    ALKPHOS 133 (H) 05/20/2022       Lab Results   Component Value Date    PT 10.6 04/06/2021    INR 0.91 04/06/2021    APTT 30.4 12/28/2018

## 2022-05-25 ENCOUNTER — Ambulatory Visit: Payer: Medicare HMO | Admitting: Physical Therapy

## 2022-05-25 ENCOUNTER — Telehealth: Payer: Self-pay | Admitting: Physical Therapy

## 2022-05-25 NOTE — Telephone Encounter (Signed)
Called pt to inquiry about absence from apt. She did not pickup so left VM instructing pt to call back to check in and reschedule.

## 2022-05-30 ENCOUNTER — Telehealth: Admit: 2022-05-30 | Discharge: 2022-05-31 | Payer: MEDICARE

## 2022-05-30 ENCOUNTER — Encounter: Payer: Medicare HMO | Admitting: Physical Therapy

## 2022-05-30 DIAGNOSIS — C50919 Malignant neoplasm of unspecified site of unspecified female breast: Principal | ICD-10-CM

## 2022-05-30 DIAGNOSIS — R634 Abnormal weight loss: Principal | ICD-10-CM

## 2022-05-30 DIAGNOSIS — Z515 Encounter for palliative care: Principal | ICD-10-CM

## 2022-05-30 NOTE — Unmapped (Unsigned)
OUTPATIENT ONCOLOGY PALLIATIVE CARE    Principal Diagnosis: Brooke [name] is a 66 y.o. female with metastatic breast cancer,  diagnosed in 2017.  Disease sites include lung and brain.     Assessment/Plan:   1.  Left shoulder discomfort and neuropathic pain in legs and feet-stable. Hx of mid upper back pain-no pain presently.      -Continue lyrica 200 mg bid dosing  -Continue hydrocodone 10/325 mg 1 tab-3 times a day as needed for pain. Rare use recently  -Continue Effexor 225 mg every day  -Scans are in the next couple weeks.    Opioid use in past:  -Had difficulty tolerating the methadone 2.5 mg dosing due to sedation.  Did not try the 1 mg methadone, so could consider this in the future if needed for the neuropathic discomfort.  Some reluctance in re-starting methadone.  -No relief with tramadol  -oxycodone-night terrors.  -Suboxone-hallucinations    2. Wgt loss/anorexia-some improvement with remeron and synthroid and control of diarrhea with lomotil  -Continue remeron 7.5 mg at bedtime    3. Mood-good.   -Continue with effexor, trazadone and clonazepam.     4. Support-having issues loading video visits on phone.  Receptive to being seen in the clinic in August in [place] to have labs, to see the breast team-Dr. Avis [name] or Brooke Nicely PA, my visit and then to have chemotherapy in [facility].  -Missed visits with Dr. Martha [name] and wants to see her again.  Thinks that she will have her video out within the next 2 weeks.  Shares that Brooke [name] her sister helps her with the video visits and Brooke [name] is needing to be more available for her immediate family.  -Requesting a DMV placard-completed and sent to [employer].    5. Goals of care-not addressed today.     At prior visits: goals which are to decrease pain and to maintain her independence. Lives alone and Brooke [name] lives close by.        Advance care planning-see advance care planning note dated 01/31/2020.  -at prior visits: Patient shared that she has been dreaming about death. She does have her funeral arrangements completed. Shared that she has her wishes written down and her Sister Brooke [name] knows where they are regarding her funeral. She still contemplating whether to be cremated or buried and she is leaning toward cremation. patient currently focused on cancer directed therapy.  Prefers to stay in the moment and not discussed things.  We will continue to support and address if she has a change in clinical condition.        -Advance directives scanned in on 11/02/19        HCDM [facility]): [place] - Sister - [phone]    HCDM, [facility] [name] - Sister - [phone]    At prior visits,   # Controlled substances risk management.  We are not currently prescribing controlled medications for her.   - Patient has a signed pain medication agreement with Outpt Palliative care, completed on 11/01/18, as per standard care. This was signed again on 01/28/19   - [place] database was reviewed today and it was appropriate.   - Urine drug screen was not performed at this visit. Findings: not applicable.   - Patient has received information about safe storage and administration of medications.   - Patient has received a prescription for narcan and shared with Brooke [name] and pt.       F/u: 2 months at [facility] on the same day  that sees breast team.    ----------------------------------------  Referring Provider: From inpatient oncology team  Oncology Team: Breast team-Brooke [name]  PCP: Brooke Milliner, MD      HPI: 66 year old woman with her to overexpressing metastatic breast cancer to her lung and brain.  Was found to have multiple small brain metastases and completed CyberKnife therapy on September 11. Describes ongoing pain in her pelvis, rates it as a 3 out of 10 today.  Has been improving over the last week or 2.  Feels like gabapentin has been helpful, and is also responded well to Tylenol and ibuprofen in the past.  She is taken oxycodone and it gave her night terrors does not want to take it again.  Has taken Dilaudid previously and did not have side effects from this.    Current cancer-directed therapy: capecitabine (Xeloda), tucatinib, and trastuzumab (Herceptin).     Diagnosis of COVID (07/18/21)      Interval hx 05/30/2022-phone visit with NP and Brooke [name] birthday with [facility]. He cooked for her.  Received gifts from her Ex-husband-he got her recliner and a [name] got her watch.  -Has a new hair style that she likes.       Symptom Review:  General: Body is fine  Pain: Still getting ulcers around lips, not as bothersome. Lost all of her front teeth-partial is in the process. Neuropathy is ok. Left shoulder radiates down to the left arm.   Fatigue: ok  Mobility: Neuropathy limits her ability to move like she would like to, but still able to live independently.    Appetite: Sl better. Planning on cooking food herself. Stopped doing this for some time.   Bowel function: Using Lomotil every 2 days-Bms have been good. No diarrhea  Mood: Describes as good.      Palliative Performance Scale: 70% - Ambulation: Reduced / unable to do normal work, some evidence of disease / Self-Care: Full / Intake: Normal or reduced / Level of Conscious: Full         Coping/Support Issues: Patient reports she is been able to attend church functions and is finding this very helpful. Has boyfriend Brooke [name] for the last 15 years.     At prior visits, patient did share that she continues to receive much help from her church friend, Brooke [name] and her husband.  They prayed together and she is found this supportive.  She states that her 2 sisters have been supportive, but she feels that they are becoming more less patient with her due to patient's irritability.     Overall coping well, no specific issues identified.  Finding support with her church community, her sister, Brooke [name], and prayer.         Social History: Pt lives in senior housing in [place] month, says it's ok and quiet but she is from [place] and liked it there more. She has 4 sisters, 2 nearby and involved in care. She has 1 son-[name], 35yo, who lives about an hour and a half away in [place]. She is divorced.     Goes to [employer] in [place] and gets a lot of support. Her church friend Brooke [name] goes with her to chemo appts. Her sisters will go too.    Advance Care Planning: Advance directives scanned into system  HCPOA: See ACP note  Living Will: See ACP note  ACP note: Yes, advance directive scanned in on November 6    Objective     Opioid Risk Tool:  Opioid Risk Tool:   Female  Female    Family history of substance abuse      Alcohol   1  3    Illegal drugs  2  3    Rx drugs  4  4    Personal history of substance abuse      Alcohol  3  3    Illegal drugs  4  4    Rx drugs  5  5    Age between 16--45 years  1  1    History of preadolescent sexual abuse  3 0    Psychological disease      ADD, OCD, bipolar, schizophrenia  2  2    Depression  1  1    Total: 7  (<3 low risk, 4-7 moderate risk, >8 high risk)      Oncology History Overview Note   Identifying Statement:  Brooke [name] is a 66 y.o. female diagnosed with a right posterior frontal lobe metastasis (breast primary) status post resection 01/15/2019 and 5/5 fractions of SBRT (2500 Gy via CyberKnife) to the resection cavity.    Treatment History:  09/05/17: S/p SRS to 7 lesions  01/15/19: S/p resection, followed by SRS 2500 cGy   03/04/19: KPS 80; MRI with postsurgical changes versus residual disease; RTC in 2 weeks w/ MRI   04/15/19: KPS NA; MRI w/ SD; no study; RTC 6 weeks w/ MRI   06/10/19: KPS 80; MRI w/ SD; RTC 4 weeks  07/15/19: KPS 80; MRI w/ SD; RTC 8 weeks  08/19/19: KPS 80; start nortriptyline for HAs; RTC 4 weeks w/ MRI  09/16/19: KPS 80; con't nortriptyline for HAs; RTC 2 mos w/ MRI  11/11/19: KPS 80; MRI w/ SD though w/ small infarct; proceed w/ CVA workup; start 81mg  ASA; encouraged smoking cessation; con't nortriptyline; RTC to discuss results  11/25/19: KPS 80; discussed CVA workup results; MRA unremarkable; con't smoking cessation efforts; con't nortriptyline for HAs; con't 81mg  ASA, start Lipitor; f/u with PCP for COPD eval; RTC 2 mos w/ MRI     Malignant neoplasm of overlapping sites of right female breast (CMS-HCC)   2017 -  Presenting Symptoms    Large open wound RT breast which began as nipple inversion. Patient states that she ignored for a long time.  Physical exam of the area of concern in the RTbreast demonstrates a large open wound in the lateral right breast. Saw PCP 01/27/16 Rx Keflex.       02/04/2016 Interval Scan(s)    MMG/US: 3.1 (3.0) cm irregular spiculated dense mass in lateral Rt breast w nipple and skin retraction. 2 subcentimeter irregular masses in the RUOQ  (10', 11') (possible satellite lesions). Large Rt axillary lymph node 1.7. Lt breast clear. BIRAD 5.       02/08/2016 Biopsy    US guided Rt. Axillary LN biopsy:  Gr 3, IDC with apocrine features. ER (-), PR (31-40), HER-2 (3+). Noted to have multiple skin lesions. Poor access to breast mass due open 10-15 cm wound risk of pain and bleeding.       02/09/2016 Initial Diagnosis    Malignant neoplasm of overlapping sites of right female breast (RAF-HCC)       02/10/2016 Interval Scan(s)    CT CAP: Large, necrotic right breast mass with wide open tract to the skin, consistent with known right breast malignancy.  Numerous enlarged right axillary, subpectoral, mediastinal, bilateral hilar lymph nodes and innumerable bilateral pulmonary nodules  02/10/2016 Interval Scan(s)    NM Bone scan: No osseous metastatic disease.       04/28/2016 - 03/10/2020 Chemotherapy    OP TRASTUZUMAB (EVERY 21 DAYS)  Trastuzumab 8 mg/kg loading then 6 mg/kg every 21 days       07/13/2016 Genetics    STRATA   ERBB2 copy number alteration  Estimated copy number: 40, confidence interval: 35.7 - 44.0, cellularity: 50%  Associated FDA-approved targeted therapies in breast cancer: trastuzumab, lapatinib, ado-trastuzumab emtansine, pertuzumab   PIK3CA p.E545A 01/03/2020 -  Cancer Staged    CT CAP 01/03/2020 which showed a new lingular opacity measuring 1.9 cm in the right lung. There was also slight increase in the right breast mass from 1.2-> 1.5 cm.  Bone scan shows no osseous metastases. Overall I considered these findings indeterminate and ppted to repeat CT CAP in 1 month     02/03/2020 -  Cancer Staged    CT chest 02/03/20 which showed that the lingular lesion has persisted and increased in size.  No mediastinal adenopathy reported.  She continues to report a nonproductive cough and mild dyspnea.  She is a chronic smoker and currently smokes on average half a pack per day.  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 Followed by [facility]    03/04/19: KPS 80; MRI with postsurgical changes versus residual disease; RTC in 2 weeks w/ MRI     01/03/2020 -  Cancer Staged    CT CAP 01/03/2020 which showed a new lingular opacity measuring 1.9 cm in the right lung. There was also slight increase in the right breast mass from 1.2-> 1.5 cm.  Bone scan shows no osseous metastases. Overall I considered these findings indeterminate and ppted to repeat CT CAP in 1 month     02/03/2020 -  Cancer Staged    CT chest 02/03/20 which showed that the lingular lesion has persisted and increased in size.  No mediastinal adenopathy reported.  She continues to report a nonproductive cough and mild dyspnea.  She is a chronic smoker and currently smokes on average half a pack per day.  My major concern is that this could be a second primary lung cancer as her other sites of diseases stable on current systemic therapy  02/03/2020 Endocrine/Hormone Therapy    Stop Tamoxifen, Add Fasoldex, continue Q3week Hercpetin     Primary malignant neoplasm of breast with metastasis (CMS-HCC)   01/25/2019 Initial Diagnosis    Metastatic breast cancer (CMS-HCC)       03/23/2020 - 02/09/2021 Chemotherapy    OP BREAST ADO-TRASTUZUMAB EMTANSINE  ado-trastuzumab emtansine 3.6 mg/kg IV on day 1, every 21 days       04/12/2021 -  Chemotherapy    Tucatinib + Capecitabine  OP TRASTUZUMAB (EVERY 21 DAYS)  trastuzumab 8 mg/kg IV LOADING, then 6 mg/kg IV MAINT, every 21 days       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)   05/07/2021 Initial Diagnosis    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)       02/24/2022 -  Cancer Staged    Staging form: Lung, AJCC 8th Edition  - Clinical: Stage IV (pM1) - Signed by Donzetta Kohut, PA on 02/24/2022               Patient Active Problem List   Diagnosis    Malignant neoplasm of overlapping sites of right female breast (CMS-HCC)    Peripheral neuropathy    Insomnia    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)    Nausea    Closed fracture of one rib with routine healing    Diarrhea of presumed infectious origin    Failure to thrive in adult    Tobacco use    Chronic diarrhea    Depression    Hyponatremia Back pain    Closed fracture of multiple pubic rami, right, initial encounter (CMS-HCC)    Macrocytosis    Pelvic fracture (CMS-HCC)    Primary malignant neoplasm of breast with metastasis (CMS-HCC)    Migraine without aura and without status migrainosus, not intractable    Essential hypertension    CVA (cerebral vascular accident) (CMS-HCC)    DDD (degenerative disc disease), cervical    Wheezing    Dyslipidemia    Angina pectoris (CMS-HCC)    Papillary fibroelastoma of heart    Antineoplastic chemotherapy induced anemia    Celiac disease    Iron deficiency anemia due to chronic blood loss    Dry eye syndrome, bilateral    Meibomian gland dysfunction (MGD) of both eyes    Glaucoma suspect of both eyes    Incipient cataract of both eyes    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)    Fatty liver    Hypokalemia    Hypothyroidism, unspecified type    Elevated TSH       Past Medical History:   Diagnosis Date    Abnormal ECG unsure    Alcoholism (CMS-HCC)     Allergic rhinitis     Anemia     Anxiety     insomnia    Arthritis not sure    Brain concussion     Breast cancer (CMS-HCC)     chemo for now with mets    COPD (chronic obstructive pulmonary disease) (CMS-HCC)     Depression     Dysphagia     Fatty liver 07/08/2021    Genitourinary disease     GERD (gastroesophageal reflux disease)     Headache     HTN (hypertension)     Hyperlipidemia     Hypothyroidism, unspecified type 01/21/2022    Insomnia     Peripheral neuropathy     due to chemo therapy    Stroke (CMS-HCC)  Nov. 2020    Dr. Theodoro Kalata    Tobacco dependence        Past Surgical History:   Procedure Laterality Date    CESAREAN SECTION      CHOLECYSTECTOMY      FINGER SURGERY Right     trigger finger    GANGLION CYST EXCISION Left     hand    HYSTERECTOMY      LYMPH NODE BIOPSY Right 02/08/2016    Axillary    PR BRNSCHSC TNDSC EBUS DX/TX INTERVENTION PERPH LES Left 03/05/2020    Procedure: Bronch, Rigid Or Flexible, Including Fluoro Guidance, When Performed; With Transendoscopic Ebus During Bronchoscopic Diagnostic Or Therapeutic Intervention(S) For Peripheral Lesion(S);  Surgeon: Jerelyn Charles, MD;  Location: MAIN OR [facility];  Service: Pulmonary    PR BRONCHOSCOPY,COMPUTER ASSIST/IMAGE-GUIDED NAVIGATION Left 03/05/2020    Procedure: BRONCHOSCOPY, RIGID OR FLEXIBLE, INCLUDE FLUORO WHEN PERFORMED; W/COMPUTER-ASSIST, IMAGE-GUIDED NAVIGATION;  Surgeon: Jerelyn Charles, MD;  Location: MAIN OR [facility];  Service: Pulmonary    PR BRONCHOSCOPY,DIAGNOSTIC W LAVAGE Left 03/05/2020    Procedure: Bronchoscopy, Rigid Or Flexible, Include Fluoroscopic Guidance When Performed; W/Bronchial Alveolar Lavage;  Surgeon: Jerelyn Charles, MD;  Location: MAIN OR [place];  Service: Pulmonary    PR BRONCHOSCOPY,TRANSBRON ASPIR BX Left 03/05/2020    Procedure: Bronchoscopy, Rigid/Flex, Incl Fluoro; W/Transbronch Ndl Aspirat Bx, Trachea, Main Stem &/Or Lobar Bronchus;  Surgeon: Jerelyn Charles, MD;  Location: MAIN OR [place];  Service: Pulmonary    PR BRONCHOSCOPY,TRANSBRONCH BIOPSY Left 03/05/2020    Procedure: Bronchoscopy, Rigid/Flexible, Include Fluoro Guidance When Performed; W/Transbronchial Lung Bx, Single Lobe;  Surgeon: Jerelyn Charles, MD;  Location: MAIN OR [facility];  Service: Pulmonary    PR CATH PLACE/CORON ANGIO, IMG SUPER/INTERP,W LEFT HEART VENTRICULOGRAPHY N/A 05/07/2020    Procedure: Left Heart Catheterization;  Surgeon: Lesle Reek, MD;  Location: [facility] CATH;  Service: Cardiology    PR COLONOSCOPY W/BIOPSY SINGLE/MULTIPLE N/A 10/21/2016    Procedure: COLONOSCOPY, FLEXIBLE, PROXIMAL TO SPLENIC FLEXURE; WITH BIOPSY, SINGLE OR MULTIPLE;  Surgeon: Monte Fantasia, MD;  Location: GI PROCEDURES [facility];  Service: Gastroenterology    PR EXCIS SUPRATENT BRAIN TUMOR Right 01/15/2019    Procedure: CRANIECTOMY; EXC BRAIN TUMOR-SUPRATENTORIAL;  Surgeon: Edison Simon, MD;  [facility]: [facility];  Service: Neurosurgery    PR INCISE FINGER TENDON SHEATH Left 06/17/2019    Procedure: R-20 TENDON SHEATH INCISION (EG, FOR TRIGGER FINGER);  Surgeon: Daisy Lazar, MD;  Location: ASC OR [facility];  Service: Orthopedics    PR MICROSURG TECHNIQUES,REQ OPER MICROSCOPE Right 01/15/2019    Procedure: MICROSURGICAL TECHNIQUES, REQUIRING USE OF OPERATING MICROSCOPE (LIST SEPARATELY IN ADDITION TO CODE FOR PRIMARY PROCEDURE);  Surgeon: Edison Simon, MD;  Location: MAIN OR [facility];  Service: Neurosurgery    PR STEREOTACTIC COMP ASSIST PROC,CRANIAL,INTRADURAL Right 01/15/2019    Procedure: STEREOTACTIC COMPUTER-ASSISTED (NAVIGATIONAL) PROCEDURE; CRANIAL, INTRADURAL;  Surgeon: Edison Simon, MD;  Location: MAIN OR [facility];  Service: Neurosurgery    PR UPPER GI ENDOSCOPY,BIOPSY N/A 10/21/2016    Procedure: UGI ENDOSCOPY; WITH BIOPSY, SINGLE OR MULTIPLE;  Surgeon: Monte Fantasia, MD;  Location: [facility];  Service: Gastroenterology    PR UPPER GI ENDOSCOPY,BIOPSY N/A 04/02/2018    Procedure: UGI ENDOSCOPY; WITH BIOPSY, SINGLE OR MULTIPLE;  Surgeon: Wendall Papa, MD;  [facility];  Service: Gastroenterology    SKIN BIOPSY         Current Outpatient Medications   Medication Sig Dispense  Refill    albuterol HFA 90 mcg/actuation inhaler Inhale 2 puffs every six (6) hours as needed for wheezing.      aluminum-magnesium hydroxide-simethicone 200-200-20 mg/5 mL Susp 80 mL, diphenhydrAMINE 12.5 mg/5 mL Liqd 200 mg, nystatin 100,000 unit/mL Susp 8,000,000 Units, distilled water Liqd 80 mL, lidocaine 2% viscous 2 % Soln 80 mL Take 5 mL by mouth every six (6) hours as needed. Swish, gargle, spit or swallow if throat issues 80 mL 2    capecitabine (XELODA) 500 MG tablet Take 1 tablet (500 mg total) by mouth two (2) times a day, continuously. To be taken every day with no breaks 60 tablet 5    clobetasoL (TEMOVATE) 0.05 % ointment Apply twice a day to rash on palm until smooth/clear. 30 g 1    clonazePAM (KLONOPIN) 0.5 MG tablet Take 0.5 mg daily as needed for anxiety.  Do not exceed 0.5 mg/day. 30 tablet 0    diclofenac sodium (VOLTAREN) 1 % gel Apply 2 g topically four (4) times a day as needed for arthritis or pain. 150 g 2    diphenoxylate-atropine (LOMOTIL) 2.5-0.025 mg per tablet Take 1 tablet by mouth four (4) times a day as needed for diarrhea. 30 tablet 1    esomeprazole (NEXIUM) 40 MG capsule Take 1 capsule (40 mg total) by mouth in the morning. 90 capsule 3    FARXIGA 5 mg Tab tablet Take 1 tablet (5 mg total) by mouth daily.      HYDROcodone-acetaminophen (NORCO 10-325) 10-325 mg per tablet Take 1 tablet by mouth every twelve (12) hours as needed for pain. 60 tablet 0    levothyroxine (SYNTHROID) 25 MCG tablet Take 1 tablet (25 mcg total) by mouth daily. 30 tablet 11    lidocaine (LIDODERM) 5 % patch Place 1 patch on the skin daily. Apply to affected area for 12 hours only each day (then remove patch) 30 patch 3    lisinopriL-hydrochlorothiazide (PRINZIDE,ZESTORETIC) 20-25 mg per tablet Take 1 tablet by mouth daily. 90 tablet 3    magic mouthwash Take 10 mL by mouth four (4) times a day as needed (Pain). 120 mL 0    MAGNESIUM ORAL Take by mouth daily. OTC (Patient not taking: Reported on 02/04/2022)      mirtazapine (REMERON) 7.5 MG tablet Take 1 tablet (7.5 mg total) by mouth nightly. 30 tablet 0    naloxone (NARCAN) 4 mg nasal spray One spray in either nostril once for known/suspected opioid overdose. May repeat every 2-3 minutes in alternating nostril til EMS arrives (Patient not taking: Reported on 11/11/2021) 2 each 0    ondansetron (ZOFRAN) 4 MG tablet Take 1 tablet (4 mg total) by mouth every six (6) hours as needed for nausea. (Patient not taking: Reported on 04/08/2022) 30 tablet 1    potassium chloride 10 MEQ ER tablet Take 2 tablets (20 mEq total) by mouth Two (2) times a day. 360 tablet 3    pregabalin (LYRICA) 200 MG capsule Take 1 capsule (200 mg total) by mouth Two (2) times a day. 60 capsule 2    prochlorperazine (COMPAZINE) 10 MG tablet Take 1 tablet (10 mg total) by mouth every six (6) hours as needed for nausea. (Patient not taking: Reported on 04/08/2022) 30 tablet 6    senna (SENOKOT) 8.6 mg tablet Take 3 tablets by mouth in the morning. 30 tablet 2    thiamine (VITAMIN B-1) 50 MG tablet Take 1 tablet (50 mg total) by mouth in the  morning. 90 tablet 1    traZODone (DESYREL) 50 MG tablet Take 1.5 tablets (75 mg total) by mouth nightly. 45 tablet 0    tucatinib (TUKYSA) 150 mg tablet Take 1 tablet (150 mg total) by mouth two (2) times a day. 60 tablet 1    tucatinib (TUKYSA) 50 mg tablet Take 2 tablets (100 mg total) by mouth Two (2) times a day . 120 tablet 3    venlafaxine (EFFEXOR-XR) 150 MG 24 hr capsule Take 2 capsules (300 mg total) by mouth daily. 60 capsule 5     No current facility-administered medications for this visit.       Allergies:   Allergies   Allergen Reactions    Adhesive Rash    Decadron [Dexamethasone] Anxiety     Mania as well    Suboxone [Buprenorphine-Naloxone] Hallucinations    Tetracycline      Other reaction(s): Other (See Comments)    Oxycodone      Night terrors    Skin Protectants, Misc. Rash    Tegaderm Adhesive-No Drug-Allergy Check Rash       Family History:  Cancer-related family history includes Cancer in her father, maternal grandfather, maternal uncle, maternal uncle, and paternal aunt.  She indicated that the status of her mother is unknown. She indicated that the status of her father is unknown. She indicated that the status of her brother is unknown. She indicated that the status of her maternal grandmother is unknown. She indicated that the status of her maternal grandfather is unknown. She indicated that the status of her paternal grandmother is unknown. She indicated that the status of her paternal grandfather is unknown. She indicated that the status of her maternal aunt is unknown. She indicated that the status of her paternal uncle is unknown. She indicated that the status of her neg hx is unknown. She indicated that the status of her other is unknown.           Lab Results   Component Value Date    CREATININE 0.66 05/20/2022     Lab Results   Component Value Date    ALKPHOS 133 (H) 05/20/2022    BILITOT 0.4 05/20/2022    BILIDIR 0.30 03/29/2021    PROT 5.5 (L) 05/20/2022    ALBUMIN 3.0 (L) 05/20/2022    ALT 11 05/20/2022    AST 33 05/20/2022                  Burtis Junes, FNP-BC, [facility]  Outpatient Oncology Palliative Care Service  [facility]  [address] [place], [place] [address]  [phone]                 The patient reports they are currently: at home. I spent 18 minutes on the phone  with the patient on the date of service. I spent an additional 15 minutes on pre- and post-visit activities on the date of service.     The patient was physically located in [place] or a state in which I am permitted to provide care. The patient and/or parent/guardian understood that s/he may incur co-pays and cost sharing, and agreed to the telemedicine visit. The visit was reasonable and appropriate under the circumstances given the patient's presentation at the time.    The patient and/or parent/guardian has been advised of the potential risks and limitations of this mode of treatment (including, but not limited to, the absence of in-person examination) and has agreed to be treated using  telemedicine. The patient's/patient's family's questions regarding telemedicine have been answered.     If the visit was completed in an ambulatory setting, the patient and/or parent/guardian has also been advised to contact their provider???s office for worsening conditions, and seek emergSynthroidSynthroidMonitorSynthroidency medicExtractional treatment and/or call 911 if the patient deems either necessary. this text, it will delete upon signing of note!    Telephone visits [phone] for Physicians and APP???s and [phone] for Non- Physician Clinicians)- Only use minutes on the phone to determine level of service.    Video visits [phone]) - Use both minutes on video and pre/post minutes to determine level of service.       :756[address]}    {    Coding tips - Do not edit this text, it will delete upon signing of note!    Telephone visits [phone] for Physicians and APP???s and [phone] for Non- Physician Clinicians)- Only use minutes on the phone to determine level of service.    Video visits [phone]) - Use both minutes on video and pre/post minutes to determine level of service.       :75688}    {    Coding tips - Do not edit this text, it will delete upon signing of note!    Telephone visits [phone] for Physicians and APP???s and [phone] for Non- Physician Clinicians)- Only use minutes on the phone to determine level of service.    Video visits [phone]) - Use both minutes on video and pre/post minutes to determine level of service.       :75688}    The patient reports they are currently: at home. I spent 31 minutes on the real-time audio and video  with the patient on the date of service. I spent an additional 20 minutes on pre- and post-visit activities on the date of service.     The patient was physically located in [place] or a state in which I am permitted to provide care. The patient and/or parent/guardian understood that s/he may incur co-pays and cost sharing, and agreed to the telemedicine visit. The visit was reasonable and appropriate under the circumstances given the patient's presentation at the time.    The patient and/or parent/guardian has been advised of the potential risks and limitations of this mode of treatment (including, but not limited to, the absence of in-person examination) and has agreed to be treated using telemedicine. The patient's/patient's family's questions regarding telemedicine have been answered.     If the visit was completed in an ambulatory setting, the patient and/or parent/guardian has also been advised to contact their provider???s office for worsening conditions, and seek emergSynthroidSynthroidMonitorSynthroidency medicExtractional treatment and/or call 911 if the patient deems either necessary.

## 2022-05-31 ENCOUNTER — Telehealth: Payer: Self-pay | Admitting: Physical Therapy

## 2022-05-31 ENCOUNTER — Ambulatory Visit: Payer: Medicare HMO | Attending: Nurse Practitioner | Admitting: Physical Therapy

## 2022-05-31 MED ORDER — MIRTAZAPINE 7.5 MG TABLET
ORAL_TABLET | Freq: Every evening | ORAL | 3 refills | 30 days | Status: CP
Start: 2022-05-31 — End: ?

## 2022-05-31 NOTE — Unmapped (Signed)
Addended by: Pam Drown ANN on: 05/31/2022 09:15 AM     Modules accepted: Orders

## 2022-05-31 NOTE — Telephone Encounter (Signed)
Called pt to inquiry about absence. Did not reach so left VM instructing that the next absence without a call would result in a removal from the schedule. Also instructed her to call back to reschedule.

## 2022-06-01 DIAGNOSIS — C50919 Malignant neoplasm of unspecified site of unspecified female breast: Principal | ICD-10-CM

## 2022-06-01 DIAGNOSIS — C50811 Malignant neoplasm of overlapping sites of right female breast: Principal | ICD-10-CM

## 2022-06-06 ENCOUNTER — Ambulatory Visit: Payer: Medicare HMO | Admitting: Physical Therapy

## 2022-06-07 ENCOUNTER — Encounter: Payer: Medicare HMO | Admitting: Physical Therapy

## 2022-06-09 ENCOUNTER — Encounter: Payer: Medicare HMO | Admitting: Physical Therapy

## 2022-06-10 ENCOUNTER — Ambulatory Visit: Admit: 2022-06-10 | Discharge: 2022-06-11 | Payer: MEDICARE

## 2022-06-10 DIAGNOSIS — R110 Nausea: Principal | ICD-10-CM

## 2022-06-10 DIAGNOSIS — E039 Hypothyroidism, unspecified: Principal | ICD-10-CM

## 2022-06-10 DIAGNOSIS — R7989 Other specified abnormal findings of blood chemistry: Principal | ICD-10-CM

## 2022-06-10 DIAGNOSIS — C50919 Malignant neoplasm of unspecified site of unspecified female breast: Principal | ICD-10-CM

## 2022-06-10 DIAGNOSIS — C7931 Secondary malignant neoplasm of brain: Principal | ICD-10-CM

## 2022-06-10 DIAGNOSIS — C50811 Malignant neoplasm of overlapping sites of right female breast: Principal | ICD-10-CM

## 2022-06-10 LAB — COMPREHENSIVE METABOLIC PANEL
ALBUMIN: 3.9 g/dL (ref 3.4–5.0)
ALKALINE PHOSPHATASE: 187 U/L — ABNORMAL HIGH (ref 46–116)
ALT (SGPT): 20 U/L (ref 10–49)
ANION GAP: 8 mmol/L (ref 5–14)
AST (SGOT): 42 U/L — ABNORMAL HIGH (ref <=34)
BILIRUBIN TOTAL: 0.4 mg/dL (ref 0.3–1.2)
BLOOD UREA NITROGEN: 12 mg/dL (ref 9–23)
BUN / CREAT RATIO: 13
CALCIUM: 9.4 mg/dL (ref 8.7–10.4)
CHLORIDE: 111 mmol/L — ABNORMAL HIGH (ref 98–107)
CO2: 24.3 mmol/L (ref 20.0–31.0)
CREATININE: 0.92 mg/dL — ABNORMAL HIGH
EGFR CKD-EPI (2021) FEMALE: 70 mL/min/{1.73_m2} (ref >=60)
GLUCOSE RANDOM: 84 mg/dL (ref 70–179)
POTASSIUM: 3.7 mmol/L (ref 3.4–4.8)
PROTEIN TOTAL: 7.2 g/dL (ref 5.7–8.2)
SODIUM: 143 mmol/L (ref 135–145)

## 2022-06-10 LAB — CANCER ANTIGEN 27.29: CA 27-29: 86.7 U/mL — ABNORMAL HIGH (ref <=38.6)

## 2022-06-10 LAB — CBC W/ AUTO DIFF
BASOPHILS ABSOLUTE COUNT: 0 10*9/L (ref 0.0–0.1)
BASOPHILS RELATIVE PERCENT: 0.5 %
EOSINOPHILS ABSOLUTE COUNT: 0.1 10*9/L (ref 0.0–0.5)
EOSINOPHILS RELATIVE PERCENT: 1 %
HEMATOCRIT: 39 % (ref 34.0–44.0)
HEMOGLOBIN: 13.7 g/dL (ref 11.3–14.9)
LYMPHOCYTES ABSOLUTE COUNT: 2.1 10*9/L (ref 1.1–3.6)
LYMPHOCYTES RELATIVE PERCENT: 35.5 %
MEAN CORPUSCULAR HEMOGLOBIN CONC: 35.1 g/dL (ref 32.0–36.0)
MEAN CORPUSCULAR HEMOGLOBIN: 41.8 pg — ABNORMAL HIGH (ref 25.9–32.4)
MEAN CORPUSCULAR VOLUME: 119 fL — ABNORMAL HIGH (ref 77.6–95.7)
MEAN PLATELET VOLUME: 7.8 fL (ref 6.8–10.7)
MONOCYTES ABSOLUTE COUNT: 0.5 10*9/L (ref 0.3–0.8)
MONOCYTES RELATIVE PERCENT: 8.7 %
NEUTROPHILS ABSOLUTE COUNT: 3.2 10*9/L (ref 1.8–7.8)
NEUTROPHILS RELATIVE PERCENT: 54.3 %
NUCLEATED RED BLOOD CELLS: 0 /100{WBCs} (ref <=4)
PLATELET COUNT: 186 10*9/L (ref 150–450)
RED BLOOD CELL COUNT: 3.28 10*12/L — ABNORMAL LOW (ref 3.95–5.13)
RED CELL DISTRIBUTION WIDTH: 15.5 % — ABNORMAL HIGH (ref 12.2–15.2)
WBC ADJUSTED: 5.9 10*9/L (ref 3.6–11.2)

## 2022-06-10 LAB — T4, FREE: FREE T4: 0.8 ng/dL — ABNORMAL LOW (ref 0.89–1.76)

## 2022-06-10 LAB — MAGNESIUM: MAGNESIUM: 1.9 mg/dL (ref 1.6–2.6)

## 2022-06-10 LAB — TSH: THYROID STIMULATING HORMONE: 4.394 u[IU]/mL (ref 0.550–4.780)

## 2022-06-10 MED ADMIN — sodium chloride (NS) 0.9 % infusion: 100 mL/h | INTRAVENOUS | @ 17:00:00 | Stop: 2022-06-10

## 2022-06-10 MED ADMIN — trastuzumab (HERCEPTIN) 300 mg in sodium chloride (NS) 0.9 % 250 mL IVPB: 6 mg/kg | INTRAVENOUS | @ 17:00:00 | Stop: 2022-06-10

## 2022-06-10 MED ADMIN — heparin, porcine (PF) 100 unit/mL injection 500 Units: 500 [IU] | INTRAVENOUS | @ 18:00:00 | Stop: 2022-06-10

## 2022-06-10 NOTE — Unmapped (Signed)
Infusion on 06/10/2022   Component Date Value Ref Range Status    TSH 06/10/2022 4.394  0.550 - 4.780 uIU/mL Final    Free T4 06/10/2022 0.80 (L)  0.89 - 1.76 ng/dL Final    Sodium 09/81/1914 143  135 - 145 mmol/L Final    Potassium 06/10/2022 3.7  3.4 - 4.8 mmol/L Final    Chloride 06/10/2022 111 (H)  98 - 107 mmol/L Final    CO2 06/10/2022 24.3  20.0 - 31.0 mmol/L Final    Anion Gap 06/10/2022 8  5 - 14 mmol/L Final    BUN 06/10/2022 12  9 - 23 mg/dL Final    Creatinine 78/29/5621 0.92 (H)  0.60 - 0.80 mg/dL Final    BUN/Creatinine Ratio 06/10/2022 13   Final    eGFR CKD-EPI (2021) Female 06/10/2022 70  >=60 mL/min/1.86m2 Final    eGFR calculated with CKD-EPI 2021 equation in accordance with [employer] and [employer] of Nephrology Task Force recommendations.    Glucose 06/10/2022 84  70 - 179 mg/dL Final    Calcium 30/86/5784 9.4  8.7 - 10.4 mg/dL Final    Albumin 69/62/9528 3.9  3.4 - 5.0 g/dL Final    Total Protein 06/10/2022 7.2  5.7 - 8.2 g/dL Final    Total Bilirubin 06/10/2022 0.4  0.3 - 1.2 mg/dL Final    AST 41/32/4401 42 (H)  <=34 U/L Final    ALT 06/10/2022 20  10 - 49 U/L Final    Alkaline Phosphatase 06/10/2022 187 (H)  46 - 116 U/L Final    Magnesium 06/10/2022 1.9  1.6 - 2.6 mg/dL Final    WBC 02/72/5366 5.9  3.6 - 11.2 10*9/L Final    RBC 06/10/2022 3.28 (L)  3.95 - 5.13 10*12/L Final    HGB 06/10/2022 13.7  11.3 - 14.9 g/dL Final    HCT 44/02/4741 39.0  34.0 - 44.0 % Final    MCV 06/10/2022 119.0 (H)  77.6 - 95.7 fL Final    MCH 06/10/2022 41.8 (H)  25.9 - 32.4 pg Final    MCHC 06/10/2022 35.1  32.0 - 36.0 g/dL Final    RDW 59/56/3875 15.5 (H)  12.2 - 15.2 % Final    MPV 06/10/2022 7.8  6.8 - 10.7 fL Final    Platelet 06/10/2022 186  150 - 450 10*9/L Final    nRBC 06/10/2022 0  <=4 /100 WBCs Final    Neutrophils % 06/10/2022 54.3  % Final    Lymphocytes % 06/10/2022 35.5  % Final    Monocytes % 06/10/2022 8.7  % Final    Eosinophils % 06/10/2022 1.0  % Final    Basophils % 06/10/2022 0.5  % Final    Absolute Neutrophils 06/10/2022 3.2  1.8 - 7.8 10*9/L Final    Absolute Lymphocytes 06/10/2022 2.1  1.1 - 3.6 10*9/L Final    Absolute Monocytes 06/10/2022 0.5  0.3 - 0.8 10*9/L Final    Absolute Eosinophils 06/10/2022 0.1  0.0 - 0.5 10*9/L Final    Absolute Basophils 06/10/2022 0.0  0.0 - 0.1 10*9/L Final    Macrocytosis 06/10/2022 Moderate (A)  Not Present Final

## 2022-06-10 NOTE — Unmapped (Addendum)
[facility] Refill Coordination Note    Specialty Medication(s) to be Shipped:   Hematology/Oncology: Capecitabine 500mg , directions: Take 1 tablet (500 mg total) by mouth two (2) times a day, continuously., Tukysa 150mg  and Tukysa 50mg     Other medication(s) to be shipped: No additional medications requested for fill at this time     Brooke [name], DOB: 1956-01-15  Phone: [phone] (home)       All above HIPAA information was verified with patient.     Was a Nurse, learning disability used for this call? No    Completed refill call assessment today to schedule patient's medication shipment from the [facility] [phone]).  All relevant notes have been reviewed.     Specialty medication(s) and dose(s) confirmed: Regimen is correct and unchanged.   Changes to medications: Rasheida reports no changes at this time.  Changes to insurance: No  New side effects reported not previously addressed with a pharmacist or physician: None reported  Questions for the pharmacist: No    Confirmed patient received a Conservation officer, historic buildings and a Surveyor, mining with first shipment. The patient will receive a drug information handout for each medication shipped and additional FDA Medication Guides as required.       DISEASE/MEDICATION-SPECIFIC INFORMATION        N/A    SPECIALTY MEDICATION ADHERENCE     Medication Adherence    Patient reported X missed doses in the last month: 0  Specialty Medication: capecitabine 500 MG tablet (XELODA)  Patient is on additional specialty medications: Yes  Additional Specialty Medications: TUKYSA 150 mg tablet (tucatinib)  Patient Reported Additional Medication X Missed Doses in the Last Month: 0  Patient is on more than two specialty medications: Yes  Specialty Medication: TUKYSA 50 mg tablet (tucatinib)  Patient Reported Additional Medication X Missed Doses in the Last Month: 0  Informant: patient  Reliability of informant: reliable  Confirmed plan for next specialty medication refill: delivery by pharmacy  Refills needed for supportive medications: not needed              Were doses missed due to medication being on hold? No    Capecitabine 500 mg: 10 days of medicine on hand   Tukysa 50 mg: 10 days of medicine on hand   Tukysa  150 mg: 10 days of medicine on hand       REFERRAL TO PHARMACIST     Referral to the pharmacist: Not needed      [facility]     Shipping address confirmed in Epic.     Delivery Scheduled: Yes, Expected medication delivery date: 06/16/2022.     Medication will be delivered via Next Day Courier to the prescription address in Epic WAM.    Cerita Rabelo D Verdine Grenfell   [facility] Specialty Technician

## 2022-06-11 NOTE — Unmapped (Signed)
Patient arrived to infusion chair ambulatory verbalizing no complaints. Weight and VS taken and recorded on flow sheet. Dose calculated and verified. Medication released for preparation by pharmacy. Denies new or worsening side effects from infusions. Port accessed using sterile technique with blood return noted. Ordered labs drawn but not results not required for today's treatment.  Port flushed and saline locked to await delivery of medication from pharmacy. Treatment was completed as per treatment plan over 30 minutes without any complication. IV tubing and port flushed following infusion to ensure medication completed. Heparin instilled and needle removed. Port site without any bleeding noted and was dressed with 2x2 gauze and covered with band aid. Patient declined printed copy of AVS and left infusion clinic ambulatory with steady gait and verbalizing no complaints. Patient accompanied by family member home to self care. Aware of return appointments and uses my chart for appointments and additional information.

## 2022-06-13 ENCOUNTER — Ambulatory Visit: Payer: Medicare HMO | Admitting: Physical Therapy

## 2022-06-13 ENCOUNTER — Telehealth: Payer: Self-pay | Admitting: Physical Therapy

## 2022-06-13 NOTE — Telephone Encounter (Signed)
Attempted to call pt but did not reach about absence. Left VM instructing pt that given that it was her 3rd no show, she would be taken off the schedule.

## 2022-06-15 MED ORDER — TRAZODONE 50 MG TABLET
ORAL_TABLET | Freq: Every evening | ORAL | 0 refills | 0 days
Start: 2022-06-15 — End: ?

## 2022-06-15 MED FILL — TUKYSA 150 MG TABLET: ORAL | 30 days supply | Qty: 60 | Fill #1

## 2022-06-15 MED FILL — CAPECITABINE 500 MG TABLET: ORAL | 30 days supply | Qty: 60 | Fill #4

## 2022-06-15 MED FILL — TUKYSA 50 MG TABLET: ORAL | 30 days supply | Qty: 120 | Fill #1

## 2022-06-16 ENCOUNTER — Ambulatory Visit: Payer: Medicare HMO | Admitting: Physical Therapy

## 2022-06-16 MED ORDER — FARXIGA 5 MG TABLET
ORAL_TABLET | Freq: Every day | ORAL | 0 refills | 90 days | Status: CP
Start: 2022-06-16 — End: ?

## 2022-06-16 NOTE — Unmapped (Signed)
Refill request received for patient.      Medication Requested: Marcelline Deist  Last Office Visit: 02/04/2022   Next Office Visit: Visit date not found  Last Prescriber: [facility]    Nurse refill requirements met? Yes  If not met, why:     Sent to: Provider for signing  If sent to provider, which provider?: VAVALLE

## 2022-06-17 DIAGNOSIS — Z5181 Encounter for therapeutic drug level monitoring: Principal | ICD-10-CM

## 2022-06-17 DIAGNOSIS — F5101 Primary insomnia: Principal | ICD-10-CM

## 2022-06-17 MED ORDER — TRAZODONE 50 MG TABLET
ORAL_TABLET | Freq: Every evening | ORAL | 0 refills | 30 days | Status: CP
Start: 2022-06-17 — End: ?
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
## 2022-06-21 ENCOUNTER — Ambulatory Visit: Admit: 2022-06-21 | Discharge: 2022-06-22 | Payer: MEDICARE

## 2022-06-21 ENCOUNTER — Ambulatory Visit: Admit: 2022-06-21 | Discharge: 2022-06-21 | Payer: MEDICARE

## 2022-06-21 MED ADMIN — iohexoL (OMNIPAQUE) 350 mg iodine/mL solution 100 mL: 100 mL | INTRAVENOUS | @ 14:00:00 | Stop: 2022-06-21

## 2022-06-21 MED ADMIN — Technetium Tc-99m Oxidronate HDP: 21.8 | INTRAVENOUS | @ 14:00:00 | Stop: 2022-06-21

## 2022-06-22 DIAGNOSIS — F32A Depression, unspecified depression type: Principal | ICD-10-CM

## 2022-06-22 MED ORDER — VENLAFAXINE ER 150 MG CAPSULE,EXTENDED RELEASE 24 HR
ORAL_CAPSULE | Freq: Every day | ORAL | 5 refills | 30 days | Status: CP
Start: 2022-06-22 — End: ?

## 2022-06-23 NOTE — Unmapped (Addendum)
BREAST MEDICAL ONCOLOGY  -----------------------------------------------------------------------------------------------------  Follow-up Outpatient Evaluation    PCP: [name], MD     Oncology team: Surgical oncology: Tyson Alias M.D.  Kerrin Champagne ---------->   Alan Ripper Dees/ [name]     Reason for Visit: Here for management of breast cancer.  -----------------------------------------------------------------------------------------------------    Assessment:  Brooke [name] is a 66 y.o. 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 She was switched d/t abnormal LFT's (from ETOH/NASH cirrhosis) on TDM1 (not progression).     Brooke [name] presents today of evaluation while on Xeloda, tucatinib + Herceptin.  Doing poorly with ongoing fatigue, intermittent diarrhea, skin changes, anorexia.  Drinking white wine at night (3+ glasses) and smoking daily (1 ppd cig/mariajuana at night).  More depression lately.  Working to get in with oral surgeon to address tooth with cavity s/p crown, root canal that needs extraction.     1. Metastatic breast cancer, ER-, PR+, HER-2 positive to lung, br  A. Systemic therapy    First-line: THP. Taxol d/c for G3 neuropathy.  Pertuzumab d/c for G3 diarrhea.  Not progression.   Second line: Trastuzumab + AI -->Tamoxifen 2/2 arthralgias.   Third line: Rebiopsy ER -/PR 10%/HER-2 +ve. C1 TDM1 03/23/20 limited by neuropathy. 09/01/20: DR TDM1 to 2.4 mg/kg. Discontinue TDM1 d/t abnormal LFT's and not progression.   Fourth line:  Herceptin + tucatinib + capecitabine (Xeloda). 05/20/21 DR capecitabine to 1000 mg BID, and tucatinib to 250 mg BID d/t diarrhea. D/t issues with adherence, her capecitabine dose was changed to 500 mg po BID on a continuous schedule on 07/06/21.  Progression of disease on scans 06/21/22 with increase in left lower lobe mass from 3.1 cm to 3.6 cm and LLL nodule from 0.4 cm to 0.5 cm.  Will stop this therapy and start Enhertu at low dose in 2 weeks.  Fifth line: Enhertu at 3.2 mg/kg and consider dose escalation as tolerates. Start on 07/15/22. Will need to hold around dental extraction, she will let us know.  Asks about radiation, will discuss with rad onc.     Potential subsequent lines:  Consider return to TDM1    B.  Systemic imaging.   -- CT C/A/P + Bone scan 06/21/22: increase in LLL mass now 3.6 cm (previously 3.1 cm)    C.  Brain mets/neuroimaging   Surgery: 01/15/19: resection of the right frontal met. [+++] MBC.  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   03/16/22 MRI Brain: SD    D.  Molecular  Genomics:  STRATA: ERBB2, PIK3CA.  Not eligible for HARMONY.  Genetic: Referral submitted in 03/2020, patient was never reached.   Tumor Markers: stable    E. Radiation:   09/05/17: CK to 7 brain lesions. 09/13/18: CK (1#) right 9 mm lesion. 2/28 - 02/28/2019: CK 25 Gy in 5# to left frontal resection bed.     F. 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 Barbette Merino who thought not likely consequential but recommended CV cards eval.   >ECHO 12/30/20, EF 60-65%, mild pulm HTN  >ECHO 05/2022 EF 60-65%, mild pulm HTN.  Cardiology OK with Enhertu; will continue with cyclical ECHOs, likely q 3 months.     2. Hypokalemia:   > currently at 5 K Cl tabs/day with normal K today.  3.  Comorbidities/supportive care   > Depression/Anxiety: F/u psychiatry/[facility]  > Peripheral neuropathy: Venlafaxine 225 mg. C/w Lyrica dose 200 mg BID.  Paraneoplastic panels unremarkable. Dose reduced TDM1.  Did not tolerate methadone due to sedation/dizziness. Follows with palliative care. Can refer back to neurology to consider TENS versus scrambler therapy.   > Smoking cessation: Previously encouraged to stop smoking. 1 ppd currently.  Dr [name]  Previously prescribed Chantix.   > Insomnia: Continue trazodone 75mg . Will continue. -- thinks this works better than Remeron.   > Arthralgias: C-spine x-ray showed DJD.  Muscle relaxant by PCP. Referred to PT for back previously. Currently receiving neck PT.  If no improvement refer back to Dr. Glenice Laine. Consider PCI. Nondisplaced right hip fracture: Has morphine patches for mgmt.   > Cirrhosis. Abnormal LFTs. Follows with GI, Dr. Raford Pitcher. Drinking 1 btl wine / night.   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 Applying foot and heel lotion. Hopefully will improve off cape.    >Hypothyroidism: continue levothyroxine 25 mcg daily    3.  Follow-up   -- Start Enhertu in 2 weeks  -- F/u In 5 weeks with C2   -- Brain MRI/Rad onc follow-up end of month    -----------------------------------------------------------------------------------------------------  Interval history:  Brooke [name] is a 66 y.o. F who presents today for follow up on Xeloda, Tucatinib, Herceptin. -  -- more depression  -- intermittent diarrhea; diagnosed with celiacs  -- lost bp and effexor meds, asks for refills  -- Present with sister, Brooke [name]  -- doing ok, fatigue, poor appetite  -- smoking cigarettes and 1 bowl of marijuana a day  -- drinking wine, 1 bt/nightly     Social history update: Lives alone.  Often accompanied by sisters Brooke [name] and Brooke [name] .     Review of Systems: A complete twelve systems review was obtained and is positive per the HPI but otherwise negative or unchanged.   ONC HISTORY  Metastatic HR + HER-2 overexpressing breast cancer to lung and brain.  Presenting stage 4 disease  In 2017  First-line and second line: Trastuzumab + AI -->Tamoxifen d/t arthralgias.  3rd line:  TDM1.  4th line: capecitabine (xeloda), tucatinib, herceptin    Consider [id number], Enhertu on progression.     Radiation: 09/05/17: CK to 7 brain lesions. 09/13/18: CK (1#) right 9 mm lesion. 2/28 - 02/28/2019: CK 25 Gy in 5# to left frontal resection bed.  Surgery: 01/15/19: resection of the right frontal met.  Genomic: STRATA: ERBB2, PIK3CA.  Not eligible for HARMONY.    2017 or earlier:  R breast wound and nipple inversion which patient describes herself as ignoring for some time    02/04/16:  Mammogram R breast showing 3.1 cm irregular spiculated dense mass in lateral Rt breast w nipple and skin retraction. 2 subcentimeter irregular masses in the RUOQ  (10', 11') (possible satellite lesions). Large Rt axillary lymph node 1.7. Lt breast clear. BIRAD 5.    02/08/16:  Core biopsy R axillary LN (breast bx deferred due to open wound) Gr 3, IDC with apocrine features. ER (-), PR (31-40), HER-2 (3+). Noted to have multiple skin lesions.    02/10/16:  Initial staging with CT CAP: Large, necrotic right breast mass with wide open tract to the skin, consistent with known right breast malignancy.  Numerous enlarged right axillary, subpectoral, mediastinal, bilateral hilar lymph nodes and innumerable bilateral pulmonary nodules.  Bone scan negative for osseous mets.    --Started trastuzumab initially with THP.  Taxol d/c after three  cycles for G3 neuropathy.  Pertuzumab d/c for G3 diarrhea.  Not discontinued due to progression.    04/28/16:  Switched to anastrozole/trastuzumab  --patient had trouble tolerating AIs, switched to exemestane    07/13/16: genomic testing with STRATA showing ERBB2 copy # alteration, PIK3CA p.[id number]    08/01/17:  MRI showed multiple small brain metastases   --completed CK to 7 lesions on 09/05/17.   --CK to the right 9 mm lesion (1 fx on 09/13/18).    --continued on AI/trastuzumab    11/2018:  progression of her frontal lobe lesion.   --resection of the right frontal mass by Dr. Tresa Garter on 01/15/2019.  Pathology was consistent with metastatic breast cancer.  --2/28 - 02/28/2019: CK 25 Gy in 5# to left frontal resection bed     04/2019: switched to tamoxifen/trastuzmab due to intolerance of AIs (joint pain    09/2019:  Concern for possible slight extra-cranial progression in LLL lung mass, elected to continue on current tx    01/03/20:  Repeat CT with new lingular 1.9 cm nodular opacity; differential diagnosis includes new metastatic lesion and consolidative/infectious opacity. CT chest 1 month is recommended to help discriminate between these two etiologies.  Also slight increase R breast mass.    02/03/20:  Repeat CT again with lingular 1.9 cm nodule unchanged to slightly increased in size, left lower lobe 3.5 cm mass slightly increased in size. Discussed at [facility] due to concern that lingular mass might represent lung primary in current smoker. Recommended biopsy.      01/2020:  Changed to fulvestrant/trastuzumab due to progression    03/05/20:  FNA lung biopsy showing carcinoma c/w breast primary (GATA3+, TTF1-), ER neg/PR 10%/HER2 3+.  Switched to [employer]. C1D1 03/23/20.    03/29/21: Switched to capecitabine (xeloda), tucatinib, herceptin d/t abnormal LFT's.      Onc Family History:   - Father: prostate  - Mat grandfather: prostate  - Mat uncle: lung  - Pat aunt: breast   - Great niece: adrenal     Social History:   Social History     Social History Narrative    She lives in senior housing in [place] x2 months. he has 4 sisters, 2 nearby and involved in care. She has 1 son-[name], recently married 38 yo, who lives about an hour and a half away in [place]. He works full-time as a Sales executive. She is still a chronic smoker (1ppd). She is currently divorced.         Goes to [employer] in [place] and gets a lot of support. Her church friend [name] and her sisters goes with her to chemo appts.     Physical Examination:   Vitals:    07/01/22 0900   BP: 151/79   Pulse: 83   Resp: 18   Temp: 36.3 ??C (97.3 ??F)   SpO2: 98%       General: Chronically ill appearing female in no acute distress.    HEENT: Well-healed (R) frontal scalp scar. EOMI. Sclerae anicteric.   Neck: Supple. No cervical or supraclavicular adenopathy.   Pulm: Lungs clear to auscultation bilat; breathing non-labored.   Cardiac: Regular rate and rhythm; no LE edema. Abdominal: Non tender  Musculoskeletal: unremarkable  Breasts/chest: Deferred: last exam:  (R) breast: Nipple autolyzed. Small ulcer laterally to redeveloping nipple closed. (L) breast exam: Port to the upper left chest.  Neurologic:  Alert and oriented. Grossly non-focal  Lymphatic: No cervical or supraclavicular adenopathy. No palpable right axillary adenopathy.  Skin: Slight Erythema of palms/feet, scaling    DATA REVIEW:    Pertinent labs/imaging/pathology reviewed in detail.

## 2022-07-01 ENCOUNTER — Ambulatory Visit: Admit: 2022-07-01 | Discharge: 2022-07-01 | Payer: MEDICARE

## 2022-07-01 ENCOUNTER — Institutional Professional Consult (permissible substitution): Admit: 2022-07-01 | Discharge: 2022-07-01 | Payer: MEDICARE

## 2022-07-01 ENCOUNTER — Telehealth: Admit: 2022-07-01 | Discharge: 2022-07-01 | Payer: MEDICARE

## 2022-07-01 DIAGNOSIS — C50919 Malignant neoplasm of unspecified site of unspecified female breast: Principal | ICD-10-CM

## 2022-07-01 DIAGNOSIS — I10 Essential (primary) hypertension: Principal | ICD-10-CM

## 2022-07-01 DIAGNOSIS — E039 Hypothyroidism, unspecified: Principal | ICD-10-CM

## 2022-07-01 DIAGNOSIS — C7802 Secondary malignant neoplasm of left lung: Principal | ICD-10-CM

## 2022-07-01 DIAGNOSIS — Z5181 Encounter for therapeutic drug level monitoring: Principal | ICD-10-CM

## 2022-07-01 DIAGNOSIS — R7989 Other specified abnormal findings of blood chemistry: Principal | ICD-10-CM

## 2022-07-01 DIAGNOSIS — C50811 Malignant neoplasm of overlapping sites of right female breast: Principal | ICD-10-CM

## 2022-07-01 DIAGNOSIS — C7931 Secondary malignant neoplasm of brain: Principal | ICD-10-CM

## 2022-07-01 DIAGNOSIS — Z1159 Encounter for screening for other viral diseases: Principal | ICD-10-CM

## 2022-07-01 DIAGNOSIS — K900 Celiac disease: Principal | ICD-10-CM

## 2022-07-01 DIAGNOSIS — R110 Nausea: Principal | ICD-10-CM

## 2022-07-01 LAB — CBC W/ AUTO DIFF
BASOPHILS ABSOLUTE COUNT: 0 10*9/L (ref 0.0–0.1)
BASOPHILS RELATIVE PERCENT: 0.7 %
EOSINOPHILS ABSOLUTE COUNT: 0.1 10*9/L (ref 0.0–0.5)
EOSINOPHILS RELATIVE PERCENT: 1.1 %
HEMATOCRIT: 42.1 % (ref 34.0–44.0)
HEMOGLOBIN: 14.7 g/dL (ref 11.3–14.9)
LYMPHOCYTES ABSOLUTE COUNT: 2.2 10*9/L (ref 1.1–3.6)
LYMPHOCYTES RELATIVE PERCENT: 34.9 %
MEAN CORPUSCULAR HEMOGLOBIN CONC: 34.8 g/dL (ref 32.0–36.0)
MEAN CORPUSCULAR HEMOGLOBIN: 42.1 pg — ABNORMAL HIGH (ref 25.9–32.4)
MEAN CORPUSCULAR VOLUME: 120.9 fL — ABNORMAL HIGH (ref 77.6–95.7)
MEAN PLATELET VOLUME: 8.4 fL (ref 6.8–10.7)
MONOCYTES ABSOLUTE COUNT: 0.5 10*9/L (ref 0.3–0.8)
MONOCYTES RELATIVE PERCENT: 7.9 %
NEUTROPHILS ABSOLUTE COUNT: 3.5 10*9/L (ref 1.8–7.8)
NEUTROPHILS RELATIVE PERCENT: 55.4 %
NUCLEATED RED BLOOD CELLS: 0 /100{WBCs} (ref <=4)
PLATELET COUNT: 167 10*9/L (ref 150–450)
RED BLOOD CELL COUNT: 3.48 10*12/L — ABNORMAL LOW (ref 3.95–5.13)
RED CELL DISTRIBUTION WIDTH: 15.9 % — ABNORMAL HIGH (ref 12.2–15.2)
WBC ADJUSTED: 6.4 10*9/L (ref 3.6–11.2)

## 2022-07-01 LAB — COMPREHENSIVE METABOLIC PANEL
ALBUMIN: 4 g/dL (ref 3.4–5.0)
ALKALINE PHOSPHATASE: 177 U/L — ABNORMAL HIGH (ref 46–116)
ALT (SGPT): 21 U/L (ref 10–49)
ANION GAP: 8 mmol/L (ref 5–14)
AST (SGOT): 36 U/L — ABNORMAL HIGH (ref <=34)
BILIRUBIN TOTAL: 0.5 mg/dL (ref 0.3–1.2)
BLOOD UREA NITROGEN: 9 mg/dL (ref 9–23)
BUN / CREAT RATIO: 11
CALCIUM: 9.2 mg/dL (ref 8.7–10.4)
CHLORIDE: 109 mmol/L — ABNORMAL HIGH (ref 98–107)
CO2: 22.8 mmol/L (ref 20.0–31.0)
CREATININE: 0.85 mg/dL — ABNORMAL HIGH
EGFR CKD-EPI (2021) FEMALE: 77 mL/min/{1.73_m2} (ref >=60)
GLUCOSE RANDOM: 119 mg/dL (ref 70–179)
POTASSIUM: 4.2 mmol/L (ref 3.4–4.8)
PROTEIN TOTAL: 7.1 g/dL (ref 5.7–8.2)
SODIUM: 140 mmol/L (ref 135–145)

## 2022-07-01 LAB — T4, FREE: FREE T4: 0.69 ng/dL — ABNORMAL LOW (ref 0.89–1.76)

## 2022-07-01 LAB — MAGNESIUM: MAGNESIUM: 1.7 mg/dL (ref 1.6–2.6)

## 2022-07-01 LAB — TSH: THYROID STIMULATING HORMONE: 2.9 u[IU]/mL (ref 0.550–4.780)

## 2022-07-01 MED ORDER — LISINOPRIL 20 MG-HYDROCHLOROTHIAZIDE 25 MG TABLET
ORAL_TABLET | Freq: Every day | ORAL | 3 refills | 90 days | Status: CP
Start: 2022-07-01 — End: 2023-07-01

## 2022-07-01 MED ADMIN — heparin, porcine (PF) 100 unit/mL injection 500 Units: 500 [IU] | INTRAVENOUS | @ 13:00:00 | Stop: 2022-07-01

## 2022-07-01 NOTE — Unmapped (Signed)
No visit today.  Video visit was added onto my template today so that I could order the EKG to monitor the QTc interval.  Concern is with the use of trazodone in combination with her current medication regimen and need to watch for the QTc interval.    Burtis Junes, FNP-BC, [facility]  Outpatient Oncology Palliative Care Service  [facility]  [address], [place], [place] [address]  [phone]

## 2022-07-04 DIAGNOSIS — R110 Nausea: Principal | ICD-10-CM

## 2022-07-04 DIAGNOSIS — E039 Hypothyroidism, unspecified: Principal | ICD-10-CM

## 2022-07-04 DIAGNOSIS — K900 Celiac disease: Principal | ICD-10-CM

## 2022-07-04 DIAGNOSIS — R7989 Other specified abnormal findings of blood chemistry: Principal | ICD-10-CM

## 2022-07-04 DIAGNOSIS — C50811 Malignant neoplasm of overlapping sites of right female breast: Principal | ICD-10-CM

## 2022-07-04 DIAGNOSIS — F32A Depression, unspecified depression type: Principal | ICD-10-CM

## 2022-07-04 DIAGNOSIS — Z1159 Encounter for screening for other viral diseases: Principal | ICD-10-CM

## 2022-07-04 DIAGNOSIS — C50919 Malignant neoplasm of unspecified site of unspecified female breast: Principal | ICD-10-CM

## 2022-07-04 MED ORDER — VENLAFAXINE ER 150 MG CAPSULE,EXTENDED RELEASE 24 HR
ORAL_CAPSULE | Freq: Every day | ORAL | 0 refills | 21 days | Status: CP
Start: 2022-07-04 — End: 2022-07-25

## 2022-07-04 NOTE — Unmapped (Signed)
Pharmacist chemotherapy first cycle counseling     Patient Information: Ms. Woodyard is a 66 y.o. female female with HER2-positive metastatic breast cancer who I have counseled prior to the initiation of  fam-trastuzumab deruxtecan-nxki (Enhertu).      Chemotherapy agent, administration, and schedule were reviewed with the patient.  Side effects discussed included but were not limited to: infusion-related reactions, pulmonary toxicity, fatigue, alopecia, N/V/D/C, complications of myelosuppression, hepatotoxicity, and cardiotoxicity.  Patient instructed to contact the team if she develops a new cough, SOB or dyspnea.    Ms. Windom does not tolerate dexamethasone so we will omit this drug pre- and post-infusion.  We will substitute dexamethasone on the day of treatment with olanzapine 5 mg.  Olanzapine will not be added on days 2-4 at this time but can be considered for future cycles if needed.    Anti-emetic regimen pre-infusion (treatment plan modified):  Olanzapine 5 mg po  Ondansetron 24 mg po  Fosaprepitant 150 mg IV    Anti-emetic regimen to be taken at home that was reviewed with the patient:  Ondansetron 8 mg q12hr on days 2-4 and then 8 mg q8hr prn  Prochlorperazine 10 mg q6hr prn     Handout provided per My Chart: Patient chemotherapy handout from [employer].     Patient verbalized understanding.  Approximate time spent on the phone with patient: 20 minutes

## 2022-07-05 NOTE — Unmapped (Signed)
Rogelio sharing it was a difficult day on Saturday after she took a shower.  Felt very fatigued and confused. NP shared that it could have been related to the effexor withdrawal.  Was able to get the Effexor yesterday and feels 100% better.  Had to pay out-of-pocket for this month.    Her church group came by yesterday and they did a prayer circle.  Comments that when she did hear the news about her cancer progression she was thinking about giving up, but after taking the Effexor feels better and wants to continue to receive therapy.    Her insurance is changing to [employer] [employer] as of 8/1 and Chyan will have [name] mail the information to her team.    Discussed about connecting with the triage team if having increasing confusion or excessive fatigue like she did on Saturday.     Burtis Junes, FNP-BC, [facility]  Outpatient Oncology Palliative Care Service  [facility]  [address], [place], [place] [address]  [phone]

## 2022-07-07 NOTE — Unmapped (Signed)
Hi,     [facility] contacted the Communication Center requesting to speak with the care team of [name] to discuss:    Patient has enrolled with [facility]. The pharmacy is asking for a list of prescriptions.    Please fax to [phone] or call [phone].    Thank you,   Kelli Hope  [facility]   [phone]

## 2022-07-08 NOTE — Unmapped (Signed)
Specialty Medication(s): [name] and capecitabine    Ms.Citron has been dis-enrolled from the [facility] specialty pharmacy services due to medication discontinuation resulting from progression of disease.    Additional information provided to the patient: no      Rollen Sox  [facility] Specialty Pharmacist

## 2022-07-08 NOTE — Unmapped (Signed)
Capecitabine and Brooke [name] have been discontinued due to progression of disease, will forward information to pharmacist.

## 2022-07-14 MED ORDER — PROCHLORPERAZINE MALEATE 10 MG TABLET
ORAL_TABLET | Freq: Four times a day (QID) | ORAL | 2 refills | 8 days | Status: CP | PRN
Start: 2022-07-14 — End: ?

## 2022-07-14 MED ORDER — ONDANSETRON HCL 8 MG TABLET
ORAL_TABLET | 2 refills | 0 days | Status: CP
Start: 2022-07-14 — End: ?

## 2022-07-14 MED ORDER — PREGABALIN 200 MG CAPSULE
ORAL_CAPSULE | Freq: Two times a day (BID) | ORAL | 0 refills | 0 days
Start: 2022-07-14 — End: ?

## 2022-07-15 ENCOUNTER — Ambulatory Visit: Admit: 2022-07-15 | Discharge: 2022-07-16 | Payer: MEDICARE

## 2022-07-15 DIAGNOSIS — R110 Nausea: Principal | ICD-10-CM

## 2022-07-15 DIAGNOSIS — K900 Celiac disease: Principal | ICD-10-CM

## 2022-07-15 DIAGNOSIS — C50811 Malignant neoplasm of overlapping sites of right female breast: Principal | ICD-10-CM

## 2022-07-15 DIAGNOSIS — R7989 Other specified abnormal findings of blood chemistry: Principal | ICD-10-CM

## 2022-07-15 DIAGNOSIS — Z1159 Encounter for screening for other viral diseases: Principal | ICD-10-CM

## 2022-07-15 DIAGNOSIS — C50919 Malignant neoplasm of unspecified site of unspecified female breast: Principal | ICD-10-CM

## 2022-07-15 DIAGNOSIS — E039 Hypothyroidism, unspecified: Principal | ICD-10-CM

## 2022-07-18 ENCOUNTER — Telehealth: Admit: 2022-07-18 | Discharge: 2022-07-19 | Payer: MEDICARE

## 2022-07-18 DIAGNOSIS — T451X5A Adverse effect of antineoplastic and immunosuppressive drugs, initial encounter: Principal | ICD-10-CM

## 2022-07-18 DIAGNOSIS — R232 Flushing: Principal | ICD-10-CM

## 2022-07-18 DIAGNOSIS — Z515 Encounter for palliative care: Principal | ICD-10-CM

## 2022-07-18 DIAGNOSIS — G620 Drug-induced polyneuropathy: Principal | ICD-10-CM

## 2022-07-18 DIAGNOSIS — C50811 Malignant neoplasm of overlapping sites of right female breast: Principal | ICD-10-CM

## 2022-07-18 MED ORDER — PREGABALIN 200 MG CAPSULE
ORAL_CAPSULE | Freq: Two times a day (BID) | ORAL | 0 refills | 30 days | Status: CP
Start: 2022-07-18 — End: ?

## 2022-07-20 DIAGNOSIS — G629 Polyneuropathy, unspecified: Principal | ICD-10-CM

## 2022-07-20 MED ORDER — PREGABALIN 200 MG CAPSULE
ORAL_CAPSULE | Freq: Two times a day (BID) | ORAL | 0 refills | 5 days | Status: CP
Start: 2022-07-20 — End: 2022-07-25

## 2022-07-21 MED ORDER — TRAZODONE 50 MG TABLET
ORAL_TABLET | Freq: Every evening | ORAL | 0 refills | 30 days | Status: CP | PRN
Start: 2022-07-21 — End: ?

## 2022-07-21 MED ORDER — VENLAFAXINE ER 150 MG CAPSULE,EXTENDED RELEASE 24 HR
ORAL_CAPSULE | Freq: Every day | ORAL | 5 refills | 30 days | Status: CP
Start: 2022-07-21 — End: ?

## 2022-07-21 MED ORDER — MIRTAZAPINE 7.5 MG TABLET
ORAL_TABLET | Freq: Every evening | ORAL | 3 refills | 30 days | Status: CP
Start: 2022-07-21 — End: ?

## 2022-07-22 DIAGNOSIS — R197 Diarrhea, unspecified: Principal | ICD-10-CM

## 2022-07-22 MED ORDER — DIPHENOXYLATE-ATROPINE 2.5 MG-0.025 MG TABLET
ORAL_TABLET | 0 refills | 0 days | Status: CP
Start: 2022-07-22 — End: ?

## 2022-07-22 MED ORDER — PREGABALIN 200 MG CAPSULE
ORAL_CAPSULE | 0 refills | 0 days
Start: 2022-07-22 — End: ?

## 2022-07-25 ENCOUNTER — Ambulatory Visit: Admit: 2022-07-25 | Discharge: 2022-07-25 | Payer: MEDICARE

## 2022-07-26 ENCOUNTER — Ambulatory Visit
Admit: 2022-07-26 | Discharge: 2022-07-27 | Payer: MEDICARE | Attending: Radiation Oncology | Primary: Radiation Oncology

## 2022-07-26 DIAGNOSIS — C7931 Secondary malignant neoplasm of brain: Secondary | ICD-10-CM | POA: Diagnosis not present

## 2022-07-27 DIAGNOSIS — F5101 Primary insomnia: Principal | ICD-10-CM

## 2022-07-27 MED ORDER — TRAZODONE 50 MG TABLET
ORAL_TABLET | 11 refills | 0 days
Start: 2022-07-27 — End: ?

## 2022-08-04 ENCOUNTER — Telehealth: Admit: 2022-08-04 | Discharge: 2022-08-05 | Payer: MEDICARE | Attending: Psychiatry | Primary: Psychiatry

## 2022-08-04 DIAGNOSIS — F5101 Primary insomnia: Principal | ICD-10-CM

## 2022-08-04 MED ORDER — TRAZODONE 50 MG TABLET
ORAL_TABLET | Freq: Every evening | ORAL | 0 refills | 30 days | Status: CP | PRN
Start: 2022-08-04 — End: ?

## 2022-08-04 MED ORDER — CLONAZEPAM 0.5 MG TABLET
ORAL_TABLET | 0 refills | 0 days | Status: CP
Start: 2022-08-04 — End: ?

## 2022-08-05 ENCOUNTER — Institutional Professional Consult (permissible substitution): Admit: 2022-08-05 | Discharge: 2022-08-05 | Payer: MEDICARE

## 2022-08-05 ENCOUNTER — Ambulatory Visit: Admit: 2022-08-05 | Discharge: 2022-08-05 | Payer: MEDICARE

## 2022-08-05 ENCOUNTER — Ambulatory Visit: Admit: 2022-08-05 | Discharge: 2022-08-05 | Payer: MEDICARE | Attending: Medical Oncology | Primary: Medical Oncology

## 2022-08-05 DIAGNOSIS — C50919 Malignant neoplasm of unspecified site of unspecified female breast: Principal | ICD-10-CM

## 2022-08-05 DIAGNOSIS — R110 Nausea: Principal | ICD-10-CM

## 2022-08-05 DIAGNOSIS — M85641 Other cyst of bone, right hand: Principal | ICD-10-CM

## 2022-08-05 DIAGNOSIS — E039 Hypothyroidism, unspecified: Principal | ICD-10-CM

## 2022-08-05 DIAGNOSIS — Z1159 Encounter for screening for other viral diseases: Principal | ICD-10-CM

## 2022-08-05 DIAGNOSIS — K900 Celiac disease: Principal | ICD-10-CM

## 2022-08-05 DIAGNOSIS — C50811 Malignant neoplasm of overlapping sites of right female breast: Principal | ICD-10-CM

## 2022-08-05 DIAGNOSIS — C7802 Secondary malignant neoplasm of left lung: Principal | ICD-10-CM

## 2022-08-05 DIAGNOSIS — R7989 Other specified abnormal findings of blood chemistry: Principal | ICD-10-CM

## 2022-08-05 DIAGNOSIS — Z5111 Encounter for antineoplastic chemotherapy: Secondary | ICD-10-CM | POA: Diagnosis not present

## 2022-08-05 DIAGNOSIS — Z5112 Encounter for antineoplastic immunotherapy: Secondary | ICD-10-CM | POA: Diagnosis not present

## 2022-08-15 ENCOUNTER — Telehealth: Admit: 2022-08-15 | Payer: MEDICARE

## 2022-08-15 DIAGNOSIS — Z515 Encounter for palliative care: Secondary | ICD-10-CM | POA: Diagnosis not present

## 2022-08-15 DIAGNOSIS — T451X5A Adverse effect of antineoplastic and immunosuppressive drugs, initial encounter: Secondary | ICD-10-CM | POA: Diagnosis not present

## 2022-08-15 DIAGNOSIS — G620 Drug-induced polyneuropathy: Secondary | ICD-10-CM | POA: Diagnosis not present

## 2022-08-15 DIAGNOSIS — R232 Flushing: Secondary | ICD-10-CM | POA: Diagnosis not present

## 2022-08-18 MED ORDER — PREGABALIN 200 MG CAPSULE
ORAL_CAPSULE | Freq: Two times a day (BID) | ORAL | 3 refills | 30 days | Status: CP
Start: 2022-08-18 — End: ?

## 2022-08-26 ENCOUNTER — Telehealth: Admit: 2022-08-26 | Discharge: 2022-08-26 | Payer: MEDICARE

## 2022-08-26 ENCOUNTER — Ambulatory Visit: Admit: 2022-08-26 | Discharge: 2022-08-26 | Payer: MEDICARE

## 2022-08-26 ENCOUNTER — Institutional Professional Consult (permissible substitution): Admit: 2022-08-26 | Discharge: 2022-08-26 | Payer: MEDICARE

## 2022-08-26 DIAGNOSIS — E039 Hypothyroidism, unspecified: Principal | ICD-10-CM

## 2022-08-26 DIAGNOSIS — C50919 Malignant neoplasm of unspecified site of unspecified female breast: Principal | ICD-10-CM

## 2022-08-26 DIAGNOSIS — C50811 Malignant neoplasm of overlapping sites of right female breast: Principal | ICD-10-CM

## 2022-08-26 DIAGNOSIS — Z79899 Other long term (current) drug therapy: Principal | ICD-10-CM

## 2022-08-26 DIAGNOSIS — T451X5A Adverse effect of antineoplastic and immunosuppressive drugs, initial encounter: Principal | ICD-10-CM

## 2022-08-26 DIAGNOSIS — R7989 Other specified abnormal findings of blood chemistry: Principal | ICD-10-CM

## 2022-08-26 DIAGNOSIS — R110 Nausea: Principal | ICD-10-CM

## 2022-08-26 DIAGNOSIS — M67431 Ganglion, right wrist: Principal | ICD-10-CM

## 2022-08-26 DIAGNOSIS — G620 Drug-induced polyneuropathy: Principal | ICD-10-CM

## 2022-08-26 DIAGNOSIS — F339 Major depressive disorder, recurrent, unspecified: Principal | ICD-10-CM

## 2022-08-26 DIAGNOSIS — Z5181 Encounter for therapeutic drug level monitoring: Principal | ICD-10-CM

## 2022-08-26 DIAGNOSIS — F17210 Nicotine dependence, cigarettes, uncomplicated: Secondary | ICD-10-CM | POA: Diagnosis not present

## 2022-08-26 MED ORDER — PREGABALIN 200 MG CAPSULE
ORAL_CAPSULE | Freq: Three times a day (TID) | ORAL | 3 refills | 30 days | Status: CP
Start: 2022-08-26 — End: ?

## 2022-09-06 DIAGNOSIS — K219 Gastro-esophageal reflux disease without esophagitis: Principal | ICD-10-CM

## 2022-09-06 MED ORDER — ESOMEPRAZOLE MAGNESIUM 40 MG CAPSULE,DELAYED RELEASE
ORAL_CAPSULE | Freq: Every day | ORAL | 3 refills | 90 days | Status: CP
Start: 2022-09-06 — End: ?

## 2022-09-06 MED ORDER — LEVOTHYROXINE 25 MCG TABLET
ORAL_TABLET | Freq: Every day | ORAL | 11 refills | 30 days | Status: CP
Start: 2022-09-06 — End: 2023-09-06

## 2022-09-08 DIAGNOSIS — M67431 Ganglion, right wrist: Secondary | ICD-10-CM | POA: Diagnosis not present

## 2022-09-08 DIAGNOSIS — M778 Other enthesopathies, not elsewhere classified: Secondary | ICD-10-CM | POA: Diagnosis not present

## 2022-09-08 DIAGNOSIS — M25531 Pain in right wrist: Secondary | ICD-10-CM | POA: Diagnosis not present

## 2022-09-08 DIAGNOSIS — M25431 Effusion, right wrist: Secondary | ICD-10-CM | POA: Diagnosis not present

## 2022-09-12 MED ORDER — FARXIGA 5 MG TABLET
ORAL_TABLET | Freq: Every day | ORAL | 0 refills | 90 days
Start: 2022-09-12 — End: ?

## 2022-09-13 DIAGNOSIS — G5793 Unspecified mononeuropathy of bilateral lower limbs: Principal | ICD-10-CM

## 2022-09-13 MED ORDER — FARXIGA 5 MG TABLET
ORAL_TABLET | Freq: Every day | ORAL | 0 refills | 90 days | Status: CP
Start: 2022-09-13 — End: ?

## 2022-09-15 MED ORDER — CLONAZEPAM 0.5 MG TABLET
ORAL_TABLET | 0 refills | 0 days | Status: CP
Start: 2022-09-15 — End: ?

## 2022-09-16 ENCOUNTER — Institutional Professional Consult (permissible substitution): Admit: 2022-09-16 | Discharge: 2022-09-16 | Payer: MEDICARE

## 2022-09-16 ENCOUNTER — Ambulatory Visit: Admit: 2022-09-16 | Discharge: 2022-09-16 | Payer: MEDICARE

## 2022-09-16 DIAGNOSIS — C50919 Malignant neoplasm of unspecified site of unspecified female breast: Principal | ICD-10-CM

## 2022-09-16 DIAGNOSIS — C50811 Malignant neoplasm of overlapping sites of right female breast: Principal | ICD-10-CM

## 2022-09-16 DIAGNOSIS — R110 Nausea: Principal | ICD-10-CM

## 2022-09-16 DIAGNOSIS — R7989 Other specified abnormal findings of blood chemistry: Principal | ICD-10-CM

## 2022-09-16 DIAGNOSIS — C7802 Secondary malignant neoplasm of left lung: Principal | ICD-10-CM

## 2022-09-16 DIAGNOSIS — Z79899 Other long term (current) drug therapy: Principal | ICD-10-CM

## 2022-09-16 DIAGNOSIS — E039 Hypothyroidism, unspecified: Principal | ICD-10-CM

## 2022-09-16 DIAGNOSIS — R530 Neoplastic (malignant) related fatigue: Secondary | ICD-10-CM | POA: Diagnosis not present

## 2022-09-16 DIAGNOSIS — M6740 Ganglion, unspecified site: Secondary | ICD-10-CM | POA: Diagnosis not present

## 2022-09-16 DIAGNOSIS — G629 Polyneuropathy, unspecified: Secondary | ICD-10-CM | POA: Diagnosis not present

## 2022-09-16 DIAGNOSIS — R519 Headache, unspecified: Secondary | ICD-10-CM | POA: Diagnosis not present

## 2022-09-16 DIAGNOSIS — F17210 Nicotine dependence, cigarettes, uncomplicated: Secondary | ICD-10-CM | POA: Diagnosis not present

## 2022-09-16 DIAGNOSIS — M2550 Pain in unspecified joint: Secondary | ICD-10-CM | POA: Diagnosis not present

## 2022-09-16 DIAGNOSIS — Z5112 Encounter for antineoplastic immunotherapy: Secondary | ICD-10-CM | POA: Diagnosis not present

## 2022-09-16 DIAGNOSIS — F32A Depression, unspecified: Secondary | ICD-10-CM | POA: Diagnosis not present

## 2022-09-16 DIAGNOSIS — M542 Cervicalgia: Secondary | ICD-10-CM | POA: Diagnosis not present

## 2022-09-16 DIAGNOSIS — E876 Hypokalemia: Secondary | ICD-10-CM | POA: Diagnosis not present

## 2022-09-16 DIAGNOSIS — C7931 Secondary malignant neoplasm of brain: Secondary | ICD-10-CM | POA: Diagnosis not present

## 2022-09-16 MED ORDER — OLANZAPINE 2.5 MG TABLET
ORAL_TABLET | Freq: Every evening | ORAL | 0 refills | 30 days | Status: CP
Start: 2022-09-16 — End: 2023-09-16

## 2022-09-19 ENCOUNTER — Ambulatory Visit: Admit: 2022-09-19 | Discharge: 2022-09-20 | Discharge status: Against Medical Advice | Payer: MEDICARE

## 2022-09-19 ENCOUNTER — Emergency Department: Admit: 2022-09-19 | Discharge: 2022-09-20 | Discharge status: Against Medical Advice | Payer: MEDICARE

## 2022-09-19 DIAGNOSIS — M546 Pain in thoracic spine: Secondary | ICD-10-CM | POA: Diagnosis not present

## 2022-09-19 DIAGNOSIS — M549 Dorsalgia, unspecified: Secondary | ICD-10-CM | POA: Diagnosis not present

## 2022-09-19 DIAGNOSIS — M545 Low back pain, unspecified: Secondary | ICD-10-CM | POA: Diagnosis not present

## 2022-09-19 DIAGNOSIS — Z5321 Procedure and treatment not carried out due to patient leaving prior to being seen by health care provider: Secondary | ICD-10-CM | POA: Diagnosis not present

## 2022-09-19 DIAGNOSIS — M79641 Pain in right hand: Secondary | ICD-10-CM | POA: Diagnosis not present

## 2022-09-19 DIAGNOSIS — M25531 Pain in right wrist: Secondary | ICD-10-CM | POA: Diagnosis not present

## 2022-09-26 DIAGNOSIS — C7931 Secondary malignant neoplasm of brain: Principal | ICD-10-CM

## 2022-09-26 DIAGNOSIS — R627 Adult failure to thrive: Principal | ICD-10-CM

## 2022-09-26 DIAGNOSIS — F3341 Major depressive disorder, recurrent, in partial remission: Principal | ICD-10-CM

## 2022-09-26 DIAGNOSIS — R5381 Other malaise: Principal | ICD-10-CM

## 2022-09-26 DIAGNOSIS — Z515 Encounter for palliative care: Principal | ICD-10-CM

## 2022-09-26 DIAGNOSIS — M5030 Other cervical disc degeneration, unspecified cervical region: Principal | ICD-10-CM

## 2022-09-26 DIAGNOSIS — G620 Drug-induced polyneuropathy: Principal | ICD-10-CM

## 2022-09-26 DIAGNOSIS — Z602 Problems related to living alone: Principal | ICD-10-CM

## 2022-09-26 DIAGNOSIS — C7802 Secondary malignant neoplasm of left lung: Principal | ICD-10-CM

## 2022-09-26 DIAGNOSIS — F3340 Major depressive disorder, recurrent, in remission, unspecified: Principal | ICD-10-CM

## 2022-09-26 DIAGNOSIS — Z9181 History of falling: Principal | ICD-10-CM

## 2022-09-26 DIAGNOSIS — M545 Low back pain, unspecified: Secondary | ICD-10-CM | POA: Diagnosis not present

## 2022-09-26 DIAGNOSIS — M4316 Spondylolisthesis, lumbar region: Secondary | ICD-10-CM | POA: Insufficient documentation

## 2022-09-26 DIAGNOSIS — M47896 Other spondylosis, lumbar region: Secondary | ICD-10-CM | POA: Diagnosis not present

## 2022-09-26 DIAGNOSIS — M79644 Pain in right finger(s): Secondary | ICD-10-CM | POA: Diagnosis not present

## 2022-09-27 ENCOUNTER — Telehealth: Payer: Self-pay

## 2022-09-27 DIAGNOSIS — R197 Diarrhea, unspecified: Principal | ICD-10-CM

## 2022-09-27 MED ORDER — DIPHENOXYLATE-ATROPINE 2.5 MG-0.025 MG TABLET
ORAL_TABLET | 0 refills | 0 days
Start: 2022-09-27 — End: ?

## 2022-09-27 NOTE — Telephone Encounter (Signed)
Attempted to contact patient and patient's sister Barbara Malone to schedule a Palliative Care consult appointment. No answer left a message to return call.

## 2022-09-28 MED ORDER — DIPHENOXYLATE-ATROPINE 2.5 MG-0.025 MG TABLET
ORAL_TABLET | Freq: Four times a day (QID) | ORAL | 0 refills | 8 days | Status: CP | PRN
Start: 2022-09-28 — End: ?

## 2022-09-29 ENCOUNTER — Ambulatory Visit: Admit: 2022-09-29 | Discharge: 2022-09-30 | Payer: MEDICARE

## 2022-09-29 DIAGNOSIS — I081 Rheumatic disorders of both mitral and tricuspid valves: Secondary | ICD-10-CM | POA: Diagnosis not present

## 2022-09-29 DIAGNOSIS — C50811 Malignant neoplasm of overlapping sites of right female breast: Secondary | ICD-10-CM | POA: Diagnosis not present

## 2022-09-29 DIAGNOSIS — R918 Other nonspecific abnormal finding of lung field: Secondary | ICD-10-CM | POA: Diagnosis not present

## 2022-09-29 DIAGNOSIS — Z171 Estrogen receptor negative status [ER-]: Secondary | ICD-10-CM | POA: Diagnosis not present

## 2022-09-29 DIAGNOSIS — C50919 Malignant neoplasm of unspecified site of unspecified female breast: Secondary | ICD-10-CM | POA: Diagnosis not present

## 2022-09-29 DIAGNOSIS — Z5181 Encounter for therapeutic drug level monitoring: Secondary | ICD-10-CM | POA: Diagnosis not present

## 2022-09-29 DIAGNOSIS — C7802 Secondary malignant neoplasm of left lung: Secondary | ICD-10-CM | POA: Diagnosis not present

## 2022-09-29 DIAGNOSIS — I272 Pulmonary hypertension, unspecified: Secondary | ICD-10-CM | POA: Diagnosis not present

## 2022-09-29 DIAGNOSIS — R937 Abnormal findings on diagnostic imaging of other parts of musculoskeletal system: Secondary | ICD-10-CM | POA: Diagnosis not present

## 2022-09-29 DIAGNOSIS — E279 Disorder of adrenal gland, unspecified: Secondary | ICD-10-CM | POA: Diagnosis not present

## 2022-09-29 DIAGNOSIS — Z79899 Other long term (current) drug therapy: Secondary | ICD-10-CM | POA: Diagnosis not present

## 2022-09-29 DIAGNOSIS — J811 Chronic pulmonary edema: Secondary | ICD-10-CM | POA: Diagnosis not present

## 2022-09-29 DIAGNOSIS — C7931 Secondary malignant neoplasm of brain: Secondary | ICD-10-CM | POA: Diagnosis not present

## 2022-09-29 DIAGNOSIS — I2510 Atherosclerotic heart disease of native coronary artery without angina pectoris: Secondary | ICD-10-CM | POA: Diagnosis not present

## 2022-10-03 ENCOUNTER — Ambulatory Visit: Admit: 2022-10-03 | Discharge: 2022-10-04 | Payer: MEDICARE

## 2022-10-03 ENCOUNTER — Telehealth: Admit: 2022-10-03 | Discharge: 2022-10-04 | Payer: MEDICARE

## 2022-10-03 DIAGNOSIS — G629 Polyneuropathy, unspecified: Secondary | ICD-10-CM | POA: Diagnosis not present

## 2022-10-03 DIAGNOSIS — C50919 Malignant neoplasm of unspecified site of unspecified female breast: Secondary | ICD-10-CM | POA: Diagnosis not present

## 2022-10-03 DIAGNOSIS — M549 Dorsalgia, unspecified: Secondary | ICD-10-CM | POA: Diagnosis not present

## 2022-10-03 DIAGNOSIS — C7931 Secondary malignant neoplasm of brain: Secondary | ICD-10-CM | POA: Diagnosis not present

## 2022-10-04 ENCOUNTER — Telehealth: Payer: Self-pay

## 2022-10-04 NOTE — Telephone Encounter (Signed)
Spoke with patient's son in law and he will relay a message to Abigail Butts to return call to discuss Palliative Care services.

## 2022-10-05 ENCOUNTER — Ambulatory Visit
Admit: 2022-10-05 | Discharge: 2022-10-06 | Payer: MEDICARE | Attending: Radiation Oncology | Primary: Radiation Oncology

## 2022-10-05 ENCOUNTER — Ambulatory Visit
Admit: 2022-10-05 | Discharge: 2022-10-25 | Payer: MEDICARE | Attending: Radiation Oncology | Primary: Radiation Oncology

## 2022-10-05 DIAGNOSIS — C50919 Malignant neoplasm of unspecified site of unspecified female breast: Secondary | ICD-10-CM | POA: Diagnosis not present

## 2022-10-05 DIAGNOSIS — C7931 Secondary malignant neoplasm of brain: Secondary | ICD-10-CM | POA: Diagnosis not present

## 2022-10-05 DIAGNOSIS — S22000A Wedge compression fracture of unspecified thoracic vertebra, initial encounter for closed fracture: Secondary | ICD-10-CM | POA: Diagnosis not present

## 2022-10-06 ENCOUNTER — Telehealth: Payer: Self-pay

## 2022-10-06 NOTE — Telephone Encounter (Signed)
After several attempts to contact patient and patient's sister Barbara Malone regarding palliative Care services with no response will cancel referral and notify referring provider.

## 2022-10-06 NOTE — Telephone Encounter (Signed)
Patient is not mine.

## 2022-10-07 ENCOUNTER — Institutional Professional Consult (permissible substitution): Admit: 2022-10-07 | Discharge: 2022-10-07 | Payer: MEDICARE

## 2022-10-07 ENCOUNTER — Ambulatory Visit: Admit: 2022-10-07 | Discharge: 2022-10-07 | Payer: MEDICARE

## 2022-10-07 ENCOUNTER — Ambulatory Visit
Admit: 2022-10-07 | Discharge: 2022-10-07 | Payer: MEDICARE | Attending: Physical Medicine & Rehabilitation | Primary: Physical Medicine & Rehabilitation

## 2022-10-07 DIAGNOSIS — R52 Pain, unspecified: Principal | ICD-10-CM

## 2022-10-07 DIAGNOSIS — C50811 Malignant neoplasm of overlapping sites of right female breast: Principal | ICD-10-CM

## 2022-10-07 DIAGNOSIS — R110 Nausea: Principal | ICD-10-CM

## 2022-10-07 DIAGNOSIS — C50919 Malignant neoplasm of unspecified site of unspecified female breast: Principal | ICD-10-CM

## 2022-10-07 DIAGNOSIS — Z780 Asymptomatic menopausal state: Secondary | ICD-10-CM | POA: Diagnosis not present

## 2022-10-07 DIAGNOSIS — C7802 Secondary malignant neoplasm of left lung: Secondary | ICD-10-CM | POA: Diagnosis not present

## 2022-10-07 DIAGNOSIS — Z5112 Encounter for antineoplastic immunotherapy: Secondary | ICD-10-CM | POA: Diagnosis not present

## 2022-10-07 DIAGNOSIS — F17200 Nicotine dependence, unspecified, uncomplicated: Secondary | ICD-10-CM | POA: Diagnosis not present

## 2022-10-07 DIAGNOSIS — Z5181 Encounter for therapeutic drug level monitoring: Secondary | ICD-10-CM | POA: Diagnosis not present

## 2022-10-07 DIAGNOSIS — C7931 Secondary malignant neoplasm of brain: Secondary | ICD-10-CM | POA: Diagnosis not present

## 2022-10-07 DIAGNOSIS — Z79899 Other long term (current) drug therapy: Secondary | ICD-10-CM | POA: Diagnosis not present

## 2022-10-12 ENCOUNTER — Ambulatory Visit: Admit: 2022-10-12 | Payer: MEDICARE

## 2022-10-13 ENCOUNTER — Ambulatory Visit: Admit: 2022-10-13 | Payer: MEDICARE | Attending: Psychiatry | Primary: Psychiatry

## 2022-10-18 MED ORDER — POTASSIUM CHLORIDE ER 10 MEQ TABLET,EXTENDED RELEASE(PART/CRYST)
ORAL_TABLET | Freq: Two times a day (BID) | ORAL | 3 refills | 90 days | Status: CP
Start: 2022-10-18 — End: 2023-10-18

## 2022-10-20 MED ORDER — OLANZAPINE 2.5 MG TABLET
ORAL_TABLET | Freq: Every evening | ORAL | 3 refills | 30 days | Status: CP
Start: 2022-10-20 — End: 2023-10-20

## 2022-10-28 ENCOUNTER — Institutional Professional Consult (permissible substitution): Admit: 2022-10-28 | Discharge: 2022-10-28 | Payer: MEDICARE

## 2022-10-28 ENCOUNTER — Ambulatory Visit: Admit: 2022-10-28 | Discharge: 2022-10-28 | Payer: MEDICARE

## 2022-10-28 ENCOUNTER — Ambulatory Visit
Admit: 2022-10-28 | Discharge: 2022-10-28 | Payer: MEDICARE | Attending: Physical Medicine & Rehabilitation | Primary: Physical Medicine & Rehabilitation

## 2022-10-28 ENCOUNTER — Ambulatory Visit: Admit: 2022-10-28 | Discharge: 2022-10-28 | Payer: MEDICARE | Attending: Medical Oncology | Primary: Medical Oncology

## 2022-10-28 DIAGNOSIS — C50919 Malignant neoplasm of unspecified site of unspecified female breast: Principal | ICD-10-CM

## 2022-10-28 DIAGNOSIS — R110 Nausea: Principal | ICD-10-CM

## 2022-10-28 DIAGNOSIS — R7989 Other specified abnormal findings of blood chemistry: Principal | ICD-10-CM

## 2022-10-28 DIAGNOSIS — C50811 Malignant neoplasm of overlapping sites of right female breast: Principal | ICD-10-CM

## 2022-10-28 DIAGNOSIS — E039 Hypothyroidism, unspecified: Principal | ICD-10-CM

## 2022-10-28 DIAGNOSIS — F32A Depression, unspecified depression type: Principal | ICD-10-CM

## 2022-10-28 DIAGNOSIS — Z5112 Encounter for antineoplastic immunotherapy: Secondary | ICD-10-CM | POA: Diagnosis not present

## 2022-10-28 DIAGNOSIS — Z5111 Encounter for antineoplastic chemotherapy: Secondary | ICD-10-CM | POA: Diagnosis not present

## 2022-10-28 MED ORDER — VENLAFAXINE ER 150 MG CAPSULE,EXTENDED RELEASE 24 HR
ORAL_CAPSULE | Freq: Every day | ORAL | 5 refills | 30 days | Status: CP
Start: 2022-10-28 — End: ?

## 2022-10-28 MED ORDER — FARXIGA 5 MG TABLET
ORAL_TABLET | Freq: Every day | ORAL | 0 refills | 90 days | Status: CP
Start: 2022-10-28 — End: ?

## 2022-11-01 ENCOUNTER — Ambulatory Visit: Admit: 2022-11-01 | Discharge: 2022-11-02 | Payer: MEDICARE

## 2022-11-01 DIAGNOSIS — C7802 Secondary malignant neoplasm of left lung: Secondary | ICD-10-CM | POA: Diagnosis not present

## 2022-11-01 DIAGNOSIS — Z780 Asymptomatic menopausal state: Secondary | ICD-10-CM | POA: Diagnosis not present

## 2022-11-01 DIAGNOSIS — M85852 Other specified disorders of bone density and structure, left thigh: Secondary | ICD-10-CM | POA: Diagnosis not present

## 2022-11-01 DIAGNOSIS — Z5181 Encounter for therapeutic drug level monitoring: Secondary | ICD-10-CM | POA: Diagnosis not present

## 2022-11-01 DIAGNOSIS — Z79899 Other long term (current) drug therapy: Secondary | ICD-10-CM | POA: Diagnosis not present

## 2022-11-01 DIAGNOSIS — C50811 Malignant neoplasm of overlapping sites of right female breast: Secondary | ICD-10-CM | POA: Diagnosis not present

## 2022-11-01 LAB — HM DEXA SCAN

## 2022-11-12 ENCOUNTER — Other Ambulatory Visit: Payer: Self-pay | Admitting: Family Medicine

## 2022-11-13 DIAGNOSIS — I10 Essential (primary) hypertension: Principal | ICD-10-CM

## 2022-11-13 MED ORDER — LISINOPRIL 20 MG-HYDROCHLOROTHIAZIDE 25 MG TABLET
ORAL_TABLET | Freq: Every day | ORAL | 3 refills | 90 days | Status: CP
Start: 2022-11-13 — End: 2023-11-13

## 2022-11-14 NOTE — Telephone Encounter (Signed)
Called pt - [facility].  Pt has not been seen by any of our providers. Pt will need to call previous provider for refill or go to mobile unit or UC for refill.

## 2022-11-14 NOTE — Telephone Encounter (Signed)
Requested Prescriptions  Pending Prescriptions Disp Refills   lisinopril-hydrochlorothiazide (ZESTORETIC) 20-25 MG tablet [Pharmacy Med Name: LISINOPRIL-HCTZ 20-25 MG TAB] 90 tablet 0    Sig: Take 1 tablet by mouth in the morning.     Cardiovascular:  ACEI + Diuretic Combos Failed - 11/12/2022  1:21 PM      Failed - Na in normal range and within 180 days    Sodium  Date Value Ref Range Status  12/10/2018 141 135 - 145 mmol/L Final         Failed - K in normal range and within 180 days    Potassium  Date Value Ref Range Status  12/10/2018 3.8 3.5 - 5.1 mmol/L Final         Failed - Cr in normal range and within 180 days    Creatinine, Ser  Date Value Ref Range Status  12/10/2018 0.80 0.44 - 1.00 mg/dL Final         Failed - eGFR is 30 or above and within 180 days    GFR calc Af Amer  Date Value Ref Range Status  09/16/2017 >60 >60 mL/min Final    Comment:    (NOTE) The eGFR has been calculated using the CKD EPI equation. This calculation has not been validated in all clinical situations. eGFR's persistently <60 mL/min signify possible Chronic Kidney Disease.    GFR calc non Af Amer  Date Value Ref Range Status  09/16/2017 >60 >60 mL/min Final         Failed - Last BP in normal range    BP Readings from Last 1 Encounters:  12/10/18 122/72         Failed - Valid encounter within last 6 months    Recent Outpatient Visits   None     Future Appointments             In 2 months Wynetta Emery, Megan P, DO [place], [place] - Patient is not pregnant

## 2022-11-25 ENCOUNTER — Ambulatory Visit: Admit: 2022-11-25 | Discharge: 2022-11-26 | Payer: MEDICARE

## 2022-11-25 ENCOUNTER — Institutional Professional Consult (permissible substitution): Admit: 2022-11-25 | Discharge: 2022-11-26 | Payer: MEDICARE

## 2022-11-25 DIAGNOSIS — C50811 Malignant neoplasm of overlapping sites of right female breast: Principal | ICD-10-CM

## 2022-11-25 DIAGNOSIS — C50919 Malignant neoplasm of unspecified site of unspecified female breast: Principal | ICD-10-CM

## 2022-11-25 DIAGNOSIS — R110 Nausea: Principal | ICD-10-CM

## 2022-11-25 DIAGNOSIS — Z23 Encounter for immunization: Principal | ICD-10-CM

## 2022-11-25 DIAGNOSIS — F17210 Nicotine dependence, cigarettes, uncomplicated: Secondary | ICD-10-CM | POA: Diagnosis not present

## 2022-11-25 DIAGNOSIS — E876 Hypokalemia: Secondary | ICD-10-CM | POA: Diagnosis not present

## 2022-11-25 DIAGNOSIS — C7931 Secondary malignant neoplasm of brain: Secondary | ICD-10-CM | POA: Diagnosis not present

## 2022-11-25 DIAGNOSIS — G629 Polyneuropathy, unspecified: Secondary | ICD-10-CM | POA: Diagnosis not present

## 2022-11-25 DIAGNOSIS — Z5112 Encounter for antineoplastic immunotherapy: Secondary | ICD-10-CM | POA: Diagnosis not present

## 2022-11-25 DIAGNOSIS — C7802 Secondary malignant neoplasm of left lung: Secondary | ICD-10-CM | POA: Diagnosis not present

## 2022-11-25 DIAGNOSIS — Z803 Family history of malignant neoplasm of breast: Secondary | ICD-10-CM | POA: Diagnosis not present

## 2022-11-25 DIAGNOSIS — L299 Pruritus, unspecified: Secondary | ICD-10-CM | POA: Diagnosis not present

## 2022-11-25 DIAGNOSIS — K029 Dental caries, unspecified: Secondary | ICD-10-CM | POA: Diagnosis not present

## 2022-11-25 DIAGNOSIS — M47812 Spondylosis without myelopathy or radiculopathy, cervical region: Secondary | ICD-10-CM | POA: Diagnosis not present

## 2022-11-25 DIAGNOSIS — M85859 Other specified disorders of bone density and structure, unspecified thigh: Secondary | ICD-10-CM | POA: Diagnosis not present

## 2022-11-25 DIAGNOSIS — E039 Hypothyroidism, unspecified: Secondary | ICD-10-CM | POA: Diagnosis not present

## 2022-12-01 ENCOUNTER — Telehealth: Admit: 2022-12-01 | Discharge: 2022-12-02 | Payer: MEDICARE | Attending: Psychiatry | Primary: Psychiatry

## 2022-12-01 DIAGNOSIS — F32A Depression, unspecified depression type: Principal | ICD-10-CM

## 2022-12-01 MED ORDER — CLONAZEPAM 0.5 MG TABLET
ORAL_TABLET | 0 refills | 0 days | Status: CP
Start: 2022-12-01 — End: ?

## 2022-12-16 ENCOUNTER — Ambulatory Visit: Admit: 2022-12-16 | Discharge: 2022-12-16 | Payer: MEDICARE

## 2022-12-16 ENCOUNTER — Institutional Professional Consult (permissible substitution): Admit: 2022-12-16 | Discharge: 2022-12-16 | Payer: MEDICARE

## 2022-12-16 DIAGNOSIS — R110 Nausea: Principal | ICD-10-CM

## 2022-12-16 DIAGNOSIS — C50919 Malignant neoplasm of unspecified site of unspecified female breast: Principal | ICD-10-CM

## 2022-12-16 DIAGNOSIS — C7802 Secondary malignant neoplasm of left lung: Principal | ICD-10-CM

## 2022-12-16 DIAGNOSIS — C50811 Malignant neoplasm of overlapping sites of right female breast: Principal | ICD-10-CM

## 2022-12-16 DIAGNOSIS — C7931 Secondary malignant neoplasm of brain: Principal | ICD-10-CM

## 2022-12-16 DIAGNOSIS — Z5112 Encounter for antineoplastic immunotherapy: Secondary | ICD-10-CM | POA: Diagnosis not present

## 2023-01-06 ENCOUNTER — Institutional Professional Consult (permissible substitution): Admit: 2023-01-06 | Discharge: 2023-01-06 | Payer: MEDICARE

## 2023-01-06 ENCOUNTER — Ambulatory Visit: Admit: 2023-01-06 | Discharge: 2023-01-06 | Payer: MEDICARE

## 2023-01-06 DIAGNOSIS — C50919 Malignant neoplasm of unspecified site of unspecified female breast: Principal | ICD-10-CM

## 2023-01-06 DIAGNOSIS — C7931 Secondary malignant neoplasm of brain: Principal | ICD-10-CM

## 2023-01-06 DIAGNOSIS — D151 Benign neoplasm of heart: Principal | ICD-10-CM

## 2023-01-06 DIAGNOSIS — C50811 Malignant neoplasm of overlapping sites of right female breast: Principal | ICD-10-CM

## 2023-01-06 DIAGNOSIS — B370 Candidal stomatitis: Principal | ICD-10-CM

## 2023-01-06 DIAGNOSIS — C7802 Secondary malignant neoplasm of left lung: Principal | ICD-10-CM

## 2023-01-06 DIAGNOSIS — M8580 Other specified disorders of bone density and structure, unspecified site: Secondary | ICD-10-CM | POA: Diagnosis not present

## 2023-01-06 DIAGNOSIS — G629 Polyneuropathy, unspecified: Secondary | ICD-10-CM | POA: Diagnosis not present

## 2023-01-06 DIAGNOSIS — E876 Hypokalemia: Secondary | ICD-10-CM | POA: Diagnosis not present

## 2023-01-06 DIAGNOSIS — F32A Depression, unspecified: Secondary | ICD-10-CM | POA: Diagnosis not present

## 2023-01-06 DIAGNOSIS — M2550 Pain in unspecified joint: Secondary | ICD-10-CM | POA: Diagnosis not present

## 2023-01-06 DIAGNOSIS — F17210 Nicotine dependence, cigarettes, uncomplicated: Secondary | ICD-10-CM | POA: Diagnosis not present

## 2023-01-06 DIAGNOSIS — R7989 Other specified abnormal findings of blood chemistry: Secondary | ICD-10-CM | POA: Diagnosis not present

## 2023-01-06 DIAGNOSIS — C7800 Secondary malignant neoplasm of unspecified lung: Secondary | ICD-10-CM | POA: Diagnosis not present

## 2023-01-06 DIAGNOSIS — E039 Hypothyroidism, unspecified: Secondary | ICD-10-CM | POA: Diagnosis not present

## 2023-01-06 DIAGNOSIS — Z5112 Encounter for antineoplastic immunotherapy: Secondary | ICD-10-CM | POA: Diagnosis not present

## 2023-01-06 MED ORDER — NYSTATIN 100,000 UNIT/ML ORAL SUSPENSION
Freq: Four times a day (QID) | ORAL | 2 refills | 24 days | Status: CP
Start: 2023-01-06 — End: ?

## 2023-01-10 ENCOUNTER — Ambulatory Visit: Admit: 2023-01-10 | Discharge: 2023-01-10 | Payer: MEDICARE

## 2023-01-10 ENCOUNTER — Ambulatory Visit
Admit: 2023-01-10 | Discharge: 2023-01-11 | Payer: MEDICARE | Attending: Radiation Oncology | Primary: Radiation Oncology

## 2023-01-10 DIAGNOSIS — C50919 Malignant neoplasm of unspecified site of unspecified female breast: Principal | ICD-10-CM

## 2023-01-10 DIAGNOSIS — C7931 Secondary malignant neoplasm of brain: Secondary | ICD-10-CM | POA: Diagnosis not present

## 2023-01-13 ENCOUNTER — Ambulatory Visit: Admit: 2023-01-13 | Payer: MEDICARE

## 2023-01-16 DIAGNOSIS — Z170 Estrogen receptor positive status [ER+]: Secondary | ICD-10-CM | POA: Diagnosis not present

## 2023-01-16 DIAGNOSIS — Z515 Encounter for palliative care: Secondary | ICD-10-CM | POA: Diagnosis not present

## 2023-01-16 DIAGNOSIS — C50811 Malignant neoplasm of overlapping sites of right female breast: Secondary | ICD-10-CM | POA: Diagnosis not present

## 2023-01-16 DIAGNOSIS — G893 Neoplasm related pain (acute) (chronic): Secondary | ICD-10-CM | POA: Diagnosis not present

## 2023-01-24 ENCOUNTER — Ambulatory Visit: Admit: 2023-01-24 | Discharge: 2023-01-25 | Payer: MEDICARE

## 2023-01-24 DIAGNOSIS — Z171 Estrogen receptor negative status [ER-]: Secondary | ICD-10-CM | POA: Diagnosis not present

## 2023-01-24 DIAGNOSIS — I081 Rheumatic disorders of both mitral and tricuspid valves: Secondary | ICD-10-CM | POA: Diagnosis not present

## 2023-01-24 DIAGNOSIS — C50919 Malignant neoplasm of unspecified site of unspecified female breast: Secondary | ICD-10-CM | POA: Diagnosis not present

## 2023-01-24 DIAGNOSIS — I272 Pulmonary hypertension, unspecified: Secondary | ICD-10-CM | POA: Diagnosis not present

## 2023-01-24 DIAGNOSIS — Z79899 Other long term (current) drug therapy: Secondary | ICD-10-CM | POA: Diagnosis not present

## 2023-01-24 DIAGNOSIS — C50811 Malignant neoplasm of overlapping sites of right female breast: Secondary | ICD-10-CM | POA: Diagnosis not present

## 2023-01-24 DIAGNOSIS — R918 Other nonspecific abnormal finding of lung field: Secondary | ICD-10-CM | POA: Diagnosis not present

## 2023-01-24 DIAGNOSIS — C7931 Secondary malignant neoplasm of brain: Secondary | ICD-10-CM | POA: Diagnosis not present

## 2023-01-24 DIAGNOSIS — Z5181 Encounter for therapeutic drug level monitoring: Secondary | ICD-10-CM | POA: Diagnosis not present

## 2023-01-24 DIAGNOSIS — C7802 Secondary malignant neoplasm of left lung: Secondary | ICD-10-CM | POA: Diagnosis not present

## 2023-01-24 DIAGNOSIS — E279 Disorder of adrenal gland, unspecified: Secondary | ICD-10-CM | POA: Diagnosis not present

## 2023-01-26 ENCOUNTER — Telehealth: Admit: 2023-01-26 | Discharge: 2023-01-27 | Payer: MEDICARE | Attending: Psychiatry | Primary: Psychiatry

## 2023-01-26 DIAGNOSIS — F3341 Major depressive disorder, recurrent, in partial remission: Principal | ICD-10-CM

## 2023-01-27 ENCOUNTER — Ambulatory Visit: Admit: 2023-01-27 | Discharge: 2023-01-27 | Payer: MEDICARE | Attending: Medical Oncology | Primary: Medical Oncology

## 2023-01-27 ENCOUNTER — Ambulatory Visit: Admit: 2023-01-27 | Discharge: 2023-01-27 | Payer: MEDICARE

## 2023-01-27 ENCOUNTER — Institutional Professional Consult (permissible substitution): Admit: 2023-01-27 | Discharge: 2023-01-27 | Payer: MEDICARE

## 2023-01-27 DIAGNOSIS — C50811 Malignant neoplasm of overlapping sites of right female breast: Principal | ICD-10-CM

## 2023-01-27 DIAGNOSIS — E039 Hypothyroidism, unspecified: Principal | ICD-10-CM

## 2023-01-27 DIAGNOSIS — C7802 Secondary malignant neoplasm of left lung: Principal | ICD-10-CM

## 2023-01-27 DIAGNOSIS — C50919 Malignant neoplasm of unspecified site of unspecified female breast: Principal | ICD-10-CM

## 2023-01-27 DIAGNOSIS — C7931 Secondary malignant neoplasm of brain: Principal | ICD-10-CM

## 2023-01-27 DIAGNOSIS — R110 Nausea: Principal | ICD-10-CM

## 2023-01-27 DIAGNOSIS — R7989 Other specified abnormal findings of blood chemistry: Principal | ICD-10-CM

## 2023-01-27 DIAGNOSIS — M4854XA Collapsed vertebra, not elsewhere classified, thoracic region, initial encounter for fracture: Secondary | ICD-10-CM | POA: Diagnosis not present

## 2023-01-27 DIAGNOSIS — G620 Drug-induced polyneuropathy: Secondary | ICD-10-CM | POA: Diagnosis not present

## 2023-01-27 DIAGNOSIS — I209 Angina pectoris, unspecified: Secondary | ICD-10-CM | POA: Diagnosis not present

## 2023-01-27 DIAGNOSIS — F17210 Nicotine dependence, cigarettes, uncomplicated: Secondary | ICD-10-CM | POA: Diagnosis not present

## 2023-01-27 DIAGNOSIS — K5900 Constipation, unspecified: Secondary | ICD-10-CM | POA: Diagnosis not present

## 2023-01-27 DIAGNOSIS — Z8616 Personal history of COVID-19: Secondary | ICD-10-CM | POA: Diagnosis not present

## 2023-01-27 DIAGNOSIS — R5383 Other fatigue: Secondary | ICD-10-CM | POA: Diagnosis not present

## 2023-01-27 DIAGNOSIS — I272 Pulmonary hypertension, unspecified: Secondary | ICD-10-CM | POA: Diagnosis not present

## 2023-01-27 DIAGNOSIS — E876 Hypokalemia: Secondary | ICD-10-CM | POA: Diagnosis not present

## 2023-01-27 DIAGNOSIS — Z5112 Encounter for antineoplastic immunotherapy: Secondary | ICD-10-CM | POA: Diagnosis not present

## 2023-01-29 DIAGNOSIS — G620 Drug-induced polyneuropathy: Secondary | ICD-10-CM | POA: Insufficient documentation

## 2023-01-30 DIAGNOSIS — T451X5A Adverse effect of antineoplastic and immunosuppressive drugs, initial encounter: Principal | ICD-10-CM

## 2023-01-30 DIAGNOSIS — G620 Drug-induced polyneuropathy: Principal | ICD-10-CM

## 2023-01-30 MED ORDER — PREGABALIN 200 MG CAPSULE
ORAL_CAPSULE | Freq: Three times a day (TID) | ORAL | 0 refills | 30 days | Status: CP
Start: 2023-01-30 — End: ?

## 2023-02-03 MED ORDER — FARXIGA 5 MG TABLET
ORAL_TABLET | Freq: Every day | ORAL | 0 refills | 30 days | Status: CP
Start: 2023-02-03 — End: ?

## 2023-02-08 ENCOUNTER — Ambulatory Visit (INDEPENDENT_AMBULATORY_CARE_PROVIDER_SITE_OTHER): Payer: 59 | Admitting: Family Medicine

## 2023-02-08 ENCOUNTER — Telehealth: Payer: Self-pay | Admitting: Family Medicine

## 2023-02-08 ENCOUNTER — Encounter: Payer: Self-pay | Admitting: Family Medicine

## 2023-02-08 VITALS — BP 107/72 | HR 76 | Temp 98.3°F | Ht 62.0 in | Wt 114.3 lb

## 2023-02-08 DIAGNOSIS — C7802 Secondary malignant neoplasm of left lung: Secondary | ICD-10-CM

## 2023-02-08 DIAGNOSIS — E782 Mixed hyperlipidemia: Secondary | ICD-10-CM

## 2023-02-08 DIAGNOSIS — C7931 Secondary malignant neoplasm of brain: Secondary | ICD-10-CM | POA: Diagnosis not present

## 2023-02-08 DIAGNOSIS — K900 Celiac disease: Secondary | ICD-10-CM

## 2023-02-08 DIAGNOSIS — I270 Primary pulmonary hypertension: Secondary | ICD-10-CM

## 2023-02-08 DIAGNOSIS — M778 Other enthesopathies, not elsewhere classified: Secondary | ICD-10-CM | POA: Diagnosis not present

## 2023-02-08 DIAGNOSIS — F419 Anxiety disorder, unspecified: Secondary | ICD-10-CM

## 2023-02-08 DIAGNOSIS — M85859 Other specified disorders of bone density and structure, unspecified thigh: Secondary | ICD-10-CM

## 2023-02-08 DIAGNOSIS — I10 Essential (primary) hypertension: Secondary | ICD-10-CM | POA: Diagnosis not present

## 2023-02-08 DIAGNOSIS — C7801 Secondary malignant neoplasm of right lung: Secondary | ICD-10-CM | POA: Diagnosis not present

## 2023-02-08 DIAGNOSIS — C50912 Malignant neoplasm of unspecified site of left female breast: Secondary | ICD-10-CM

## 2023-02-08 DIAGNOSIS — D500 Iron deficiency anemia secondary to blood loss (chronic): Secondary | ICD-10-CM

## 2023-02-08 DIAGNOSIS — H04203 Unspecified epiphora, bilateral lacrimal glands: Secondary | ICD-10-CM

## 2023-02-08 DIAGNOSIS — Z720 Tobacco use: Secondary | ICD-10-CM

## 2023-02-08 DIAGNOSIS — E039 Hypothyroidism, unspecified: Secondary | ICD-10-CM | POA: Diagnosis not present

## 2023-02-08 DIAGNOSIS — F339 Major depressive disorder, recurrent, unspecified: Secondary | ICD-10-CM

## 2023-02-08 DIAGNOSIS — S22000A Wedge compression fracture of unspecified thoracic vertebra, initial encounter for closed fracture: Secondary | ICD-10-CM

## 2023-02-08 DIAGNOSIS — I058 Other rheumatic mitral valve diseases: Secondary | ICD-10-CM

## 2023-02-08 DIAGNOSIS — M4316 Spondylolisthesis, lumbar region: Secondary | ICD-10-CM

## 2023-02-08 DIAGNOSIS — C50911 Malignant neoplasm of unspecified site of right female breast: Secondary | ICD-10-CM

## 2023-02-08 DIAGNOSIS — G629 Polyneuropathy, unspecified: Secondary | ICD-10-CM | POA: Diagnosis not present

## 2023-02-08 DIAGNOSIS — G620 Drug-induced polyneuropathy: Secondary | ICD-10-CM | POA: Diagnosis not present

## 2023-02-08 DIAGNOSIS — F17210 Nicotine dependence, cigarettes, uncomplicated: Secondary | ICD-10-CM

## 2023-02-08 DIAGNOSIS — S22070D Wedge compression fracture of T9-T10 vertebra, subsequent encounter for fracture with routine healing: Secondary | ICD-10-CM

## 2023-02-08 MED ORDER — LISINOPRIL-HYDROCHLOROTHIAZIDE 10-12.5 MG PO TABS: 1.0000 | ORAL_TABLET | Freq: Every day | ORAL | 1 refills | Status: DC

## 2023-02-08 NOTE — Telephone Encounter (Signed)
Copied from [place]. 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 Pt stated this is incorrect because it is Cancer of Right breast metastatic to brain.  Pt stated she would like this corrected. Stated she was very pleased with Dr.Johnson and is very thankful.  Please advise.

## 2023-02-08 NOTE — Progress Notes (Signed)
BP 107/72   Pulse 76   Temp 98.3 F (36.8 C) (Oral)   Ht 5' 2"$  (1.575 m)   Wt 114 lb 4.8 oz (51.8 kg)   SpO2 96%   BMI 20.91 kg/m    Subjective:    Patient ID: Barbara Malone, female    DOB: 05-31-1956, 67 y.o.   MRN: [id number]  HPI: Barbara Malone is a 67 y.o. female  Chief Complaint  Patient presents with   New Patient (Initial Visit)    HYPERTENSION / HYPERLIPIDEMIA Satisfied with current treatment? {Blank single:19197::"yes","no"} Duration of hypertension: {Blank single:19197::"chronic","months","years"} BP monitoring frequency: {Blank single:19197::"not checking","rarely","daily","weekly","monthly","a few times a day","a few times a week","a few times a month"} BP range:  BP medication side effects: {Blank single:19197::"yes","no"} Past BP meds: {Blank A999333 (bystolic)","carvedilol","chlorthalidone","clonidine","diltiazem","exforge HCT","HCTZ","irbesartan (avapro)","labetalol","lisinopril","lisinopril-HCTZ","losartan (cozaar)","methyldopa","nifedipine","olmesartan (benicar)","olmesartan-HCTZ","quinapril","ramipril","spironalactone","tekturna","valsartan","valsartan-HCTZ","verapamil"} Duration of hyperlipidemia: {Blank single:19197::"chronic","months","years"} Cholesterol medication side effects: {Blank single:19197::"yes","no"} Cholesterol supplements: {Blank multiple:19196::"none","fish oil","niacin","red yeast rice"} Past cholesterol medications: {Blank multiple:19196::"none","atorvastain (lipitor)","lovastatin (mevacor)","pravastatin (pravachol)","rosuvastatin (crestor)","simvastatin (zocor)","vytorin","fenofibrate (tricor)","gemfibrozil","ezetimide (zetia)","niaspan","lovaza"} Medication compliance: {Blank single:19197::"excellent compliance","good compliance","fair compliance","poor compliance"} Aspirin: {Blank single:19197::"yes","no"} Recent stressors: {Blank  single:19197::"yes","no"} Recurrent headaches: {Blank single:19197::"yes","no"} Visual changes: {Blank single:19197::"yes","no"} Palpitations: {Blank single:19197::"yes","no"} Dyspnea: {Blank single:19197::"yes","no"} Chest pain: {Blank single:19197::"yes","no"} Lower extremity edema: {Blank single:19197::"yes","no"} Dizzy/lightheaded: {Blank single:19197::"yes","no"}  ANXIETY/STRESS Duration:{Blank single:19197::"controlled","uncontrolled","better","worse","exacerbated","stable"} Anxious mood: {Blank single:19197::"yes","no"}  Excessive worrying: {Blank single:19197::"yes","no"} Irritability: {Blank single:19197::"yes","no"}  Sweating: {Blank single:19197::"yes","no"} Nausea: {Blank single:19197::"yes","no"} Palpitations:{Blank single:19197::"yes","no"} Hyperventilation: {Blank single:19197::"yes","no"} Panic attacks: {Blank single:19197::"yes","no"} Agoraphobia: {Blank single:19197::"yes","no"}  Obscessions/compulsions: {Blank single:19197::"yes","no"} Depressed mood: {Blank single:19197::"yes","no"}     No data to display         Anhedonia: {Blank single:19197::"yes","no"} Weight changes: {Blank single:19197::"yes","no"} Insomnia: {Blank single:19197::"yes","no"} {Blank single:19197::"hard to fall asleep","hard to stay asleep"}  Hypersomnia: {Blank single:19197::"yes","no"} Fatigue/loss of energy: {Blank single:19197::"yes","no"} Feelings of worthlessness: {Blank single:19197::"yes","no"} Feelings of guilt: {Blank single:19197::"yes","no"} Impaired concentration/indecisiveness: {Blank single:19197::"yes","no"} Suicidal ideations: {Blank single:19197::"yes","no"}  Crying spells: {Blank single:19197::"yes","no"} Recent Stressors/Life Changes: {Blank single:19197::"yes","no"}   Relationship problems: {Blank single:19197::"yes","no"}   Family stress: {Blank single:19197::"yes","no"}     Financial stress: {Blank single:19197::"yes","no"}    Job stress: {Blank  single:19197::"yes","no"}    Recent death/loss: {Blank single:19197::"yes","no"}   Active Ambulatory Problems    Diagnosis Date Noted   No Active Ambulatory Problems   Resolved Ambulatory Problems    Diagnosis Date Noted   No Resolved Ambulatory Problems   Past Medical History:  Diagnosis Date   Anxiety    Brain tumor ([place])    Cancer ([place])    Depression    Hyperlipidemia    Metastatic breast cancer    Peripheral neuropathy    Past Surgical History:  Procedure Laterality Date   CARPAL TUNNEL RELEASE     CESAREAN SECTION     TRIGGER FINGER RELEASE     Outpatient Encounter Medications as of 02/08/2023  Medication Sig   albuterol (VENTOLIN HFA) 108 (90 Base) MCG/ACT inhaler Inhale 2 puffs into the lungs every 6 (six) hours as needed for wheezing or shortness of breath.   anastrozole (ARIMIDEX) 1 MG tablet Take 1 mg by mouth daily.   capecitabine (XELODA) 500 MG tablet Take 1,000 mg/m2 by mouth 2 (two) times daily after a meal.   clobetasol ointment (TEMOVATE) [id number] % Apply 1 Application topically 2 (two) times daily.   clonazePAM (KLONOPIN) 1 MG tablet Take 1 mg by mouth 2 (two) times daily.   dapagliflozin propanediol (FARXIGA) 5 MG TABS tablet Take 5 mg by mouth daily.   diclofenac (VOLTAREN) 50 MG EC tablet Take 50 mg by mouth 2 (two) times daily.  diclofenac Sodium (VOLTAREN) 1 % GEL Apply 2 g topically 4 (four) times daily.   diphenoxylate-atropine (LOMOTIL) 2.5-0.025 MG tablet Take by mouth 4 (four) times daily as needed for diarrhea or loose stools.   dorzolamide (TRUSOPT) 2 % ophthalmic solution 1 drop 3 (three) times daily.   esomeprazole (NEXIUM) 40 MG capsule Take 40 mg by mouth daily at 12 noon.   HYDROcodone-acetaminophen (NORCO) 10-325 MG tablet Take 1 tablet by mouth every 12 (twelve) hours as needed for severe pain or moderate pain.   lidocaine (LIDODERM) 5 % Place 1 patch onto the skin daily. Remove & Discard patch within 12 hours or as directed by MD    lisinopril (PRINIVIL,ZESTRIL) 10 MG tablet Take 10 mg by mouth daily.   lisinopril-hydrochlorothiazide (ZESTORETIC) 20-25 MG tablet Take 1 tablet by mouth daily.   magic mouthwash (lidocaine, diphenhydrAMINE, alum & mag hydroxide) suspension 5 mLs.   MAGNESIUM PO Take by mouth.   ondansetron (ZOFRAN) 4 MG tablet Take 4 mg by mouth every 8 (eight) hours as needed for nausea or vomiting.   potassium chloride (KLOR-CON) 20 MEQ packet Take by mouth QID.   pregabalin (LYRICA) 200 MG capsule Take 200 mg by mouth 2 (two) times daily.   prochlorperazine (COMPAZINE) 10 MG tablet Take 10 mg by mouth every 6 (six) hours as needed for nausea or vomiting.   senna (SENOKOT) 8.6 MG TABS tablet Take 1 tablet by mouth.   thiamine 50 MG tablet Take 50 mg by mouth daily.   traZODone (DESYREL) 50 MG tablet Take 50 mg by mouth at bedtime.   venlafaxine XR (EFFEXOR-XR) 150 MG 24 hr capsule Take 150 mg by mouth daily with breakfast.   DULoxetine (CYMBALTA) 20 MG capsule Take 20 mg by mouth daily. (Patient not taking: Reported on 02/08/2023)   No facility-administered encounter medications on file as of 02/08/2023.   Allergies  Allergen Reactions   Oxycodone Itching   Tape Dermatitis   Tetracyclines & Related Other (See Comments)   Vicodin [Hydrocodone-Acetaminophen] Itching   Social History   Socioeconomic History   Marital status: Divorced    Spouse name: Not on file   Number of children: Not on file   Years of education: Not on file   Highest education level: Not on file  Occupational History   Not on file  Tobacco Use   Smoking status: Every Day    Packs/day: 1.00    Types: Cigarettes   Smokeless tobacco: Never  Substance and Sexual Activity   Alcohol use: Yes    Alcohol/week: 20.0 standard drinks of alcohol    Types: 20 Glasses of wine per week   Drug use: No   Sexual activity: Not on file  Other Topics Concern   Not on file  Social History Narrative   Not on file   Social Determinants of  Health   Financial Resource Strain: Not on file  Food Insecurity: Not on file  Transportation Needs: Not on file  Physical Activity: Not on file  Stress: Not on file  Social Connections: Not on file   No family history on file.   Review of Systems  Per HPI unless specifically indicated above     Objective:    BP 107/72   Pulse 76   Temp 98.3 F (36.8 C) (Oral)   Ht 5' 2"$  (1.575 m)   Wt 114 lb 4.8 oz (51.8 kg)   SpO2 96%   BMI 20.91 kg/m   Wt Readings from Last 3  Encounters:  02/08/23 114 lb 4.8 oz (51.8 kg)  12/10/18 100 lb (45.4 kg)  09/16/17 101 lb 9.6 oz (46.1 kg)    Physical Exam  Results for orders placed or performed during the hospital encounter of 12/10/18  I-STAT, chem 8  Result Value Ref Range   Sodium 141 135 - 145 mmol/L   Potassium 3.8 3.5 - 5.1 mmol/L   Chloride 108 98 - 111 mmol/L   BUN <3 (L) 6 - 20 mg/dL   Creatinine, Ser 0.80 0.44 - 1.00 mg/dL   Glucose, Bld 91 70 - 99 mg/dL   Calcium, Ion 1.09 (L) 1.15 - 1.40 mmol/L   TCO2 25 22 - 32 mmol/L   Hemoglobin 13.6 12.0 - 15.0 g/dL   HCT 40.0 36.0 - 46.0 %      Assessment & Plan:   Problem List Items Addressed This Visit   None    Follow up plan: No follow-ups on file.

## 2023-02-09 ENCOUNTER — Encounter: Payer: Self-pay | Admitting: Family Medicine

## 2023-02-09 DIAGNOSIS — K900 Celiac disease: Secondary | ICD-10-CM | POA: Insufficient documentation

## 2023-02-09 DIAGNOSIS — H04203 Unspecified epiphora, bilateral lacrimal glands: Secondary | ICD-10-CM | POA: Insufficient documentation

## 2023-02-09 DIAGNOSIS — M778 Other enthesopathies, not elsewhere classified: Secondary | ICD-10-CM | POA: Insufficient documentation

## 2023-02-09 DIAGNOSIS — F339 Major depressive disorder, recurrent, unspecified: Secondary | ICD-10-CM | POA: Insufficient documentation

## 2023-02-09 DIAGNOSIS — Z720 Tobacco use: Secondary | ICD-10-CM | POA: Insufficient documentation

## 2023-02-09 DIAGNOSIS — E782 Mixed hyperlipidemia: Secondary | ICD-10-CM | POA: Insufficient documentation

## 2023-02-09 DIAGNOSIS — I058 Other rheumatic mitral valve diseases: Secondary | ICD-10-CM | POA: Insufficient documentation

## 2023-02-09 DIAGNOSIS — I10 Essential (primary) hypertension: Secondary | ICD-10-CM | POA: Insufficient documentation

## 2023-02-09 DIAGNOSIS — E039 Hypothyroidism, unspecified: Secondary | ICD-10-CM | POA: Insufficient documentation

## 2023-02-09 DIAGNOSIS — C7801 Secondary malignant neoplasm of right lung: Secondary | ICD-10-CM | POA: Insufficient documentation

## 2023-02-09 DIAGNOSIS — C50911 Malignant neoplasm of unspecified site of right female breast: Secondary | ICD-10-CM | POA: Insufficient documentation

## 2023-02-09 DIAGNOSIS — M85859 Other specified disorders of bone density and structure, unspecified thigh: Secondary | ICD-10-CM | POA: Insufficient documentation

## 2023-02-09 DIAGNOSIS — F332 Major depressive disorder, recurrent severe without psychotic features: Secondary | ICD-10-CM | POA: Insufficient documentation

## 2023-02-09 DIAGNOSIS — I270 Primary pulmonary hypertension: Secondary | ICD-10-CM | POA: Insufficient documentation

## 2023-02-09 DIAGNOSIS — F419 Anxiety disorder, unspecified: Secondary | ICD-10-CM | POA: Insufficient documentation

## 2023-02-09 DIAGNOSIS — S22070A Wedge compression fracture of T9-T10 vertebra, initial encounter for closed fracture: Secondary | ICD-10-CM | POA: Insufficient documentation

## 2023-02-09 DIAGNOSIS — G629 Polyneuropathy, unspecified: Secondary | ICD-10-CM | POA: Insufficient documentation

## 2023-02-09 NOTE — Assessment & Plan Note (Signed)
Follows with psychiatry. Stable. Continue to monitor.

## 2023-02-09 NOTE — Assessment & Plan Note (Signed)
Follows with oncology and palliative. Continue to monitor. Call with any concerns.

## 2023-02-09 NOTE — Assessment & Plan Note (Signed)
Stable. Continue to follow with cardiology. Call with any concerns.

## 2023-02-09 NOTE — Assessment & Plan Note (Signed)
Continue diet. Continue to monitor.

## 2023-02-09 NOTE — Assessment & Plan Note (Addendum)
Follows with oncology and palliative. Continue to monitor. Call with any concerns.

## 2023-02-09 NOTE — Assessment & Plan Note (Signed)
Following with ortho. Call with any concerns. Continue to monitor.

## 2023-02-09 NOTE — Assessment & Plan Note (Signed)
Normal on last check. Continue to monitor. Call with any concerns.

## 2023-02-09 NOTE — Assessment & Plan Note (Addendum)
Continue vitamin D and calcium. Continue to monitor.

## 2023-02-09 NOTE — Assessment & Plan Note (Signed)
Still in significant pain. Will get her into neurosurgery. Referral generated today. DEXA up to date.

## 2023-02-09 NOTE — Assessment & Plan Note (Signed)
Doing OK right now. Continue to monitor. Call with any concerns.

## 2023-02-09 NOTE — Assessment & Plan Note (Signed)
Stable. Labs last checked by oncology. Continue to monitor.

## 2023-02-09 NOTE — Assessment & Plan Note (Signed)
Follows with cardiology. Stable on farxiga. Continue to monitor. Call with any concerns.

## 2023-02-09 NOTE — Assessment & Plan Note (Signed)
BP on low end of normal. Getting dizzy. Will cut her lisinopril-HCTZ in 1/2 and recheck 1 month. Call with any concerns.

## 2023-02-09 NOTE — Assessment & Plan Note (Signed)
Not interested in quitting at this time. Continue to monitor.

## 2023-02-09 NOTE — Assessment & Plan Note (Signed)
Following with ophthalmology. Call with any concerns.

## 2023-02-09 NOTE — Assessment & Plan Note (Signed)
Due for recheck. Will check next visit.

## 2023-02-09 NOTE — Assessment & Plan Note (Signed)
Monitored by oncology. Call with any concerns.

## 2023-02-09 NOTE — Assessment & Plan Note (Signed)
Follows with ortho and palliative. Continue to monitor. Call with any concerns.

## 2023-02-10 NOTE — Progress Notes (Unsigned)
Referring Physician:  Valerie Roys, DO [place],  [place] [address]  Primary Physician:  Valerie Roys, DO  History of Present Illness: 02/15/2023 Barbara Malone has a history of breast CA with mets to brain and lungs. She is seeing oncology and palliative care.   Also with history of HTN, hyperlipidemia, drug induced peripheral neuropathy, mitral valve mass, and pulmonary HTN.   Chest CT and bone scan on 09/29/22 showed a new T10 compression fracture. This was also seen on thoracic xrays from 09/19/22.   She has more constant mid back pain that can radiate into her lower back and intermittent  posterior leg pain to her knee that that runs laterally to her feet x 1+ years. LBP > leg pain, left leg pain > right leg pain. Pain is worse with prolonged standing (doing dishes), bending, and prolonged walking. 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 She has weakness in her legs with walking.   She smokes 1ppd x 40 years.   Bowel/Bladder Dysfunction: none  Conservative measures:  Physical therapy: none for her back Multimodal medical therapy including regular antiinflammatories: voltaren, voltaren gel, norco 10, lidoderm, lyrica  Injections: No epidural steroid injections  She had some trigger point injections with short term relief.   Past Surgery: no neck or back surgery  Barbara Malone has no symptoms of cervical myelopathy. She notes some balance issues.   The symptoms are causing a significant impact on the patient's life.   Review of Systems:  A 10 point review of systems is negative, except for the pertinent positives and negatives detailed in the HPI.  Past Medical History: Past Medical History:  Diagnosis Date   Anxiety    Brain tumor ([place])    Cancer ([place])    breast, metastatic, active   Depression    still active; takes meds for depression;    Hyperlipidemia    controllled with medication;    Metastatic breast cancer    Peripheral neuropathy      Past Surgical History: Past Surgical History:  Procedure Laterality Date   CARPAL TUNNEL RELEASE     CESAREAN SECTION     TRIGGER FINGER RELEASE      Allergies: Allergies as of 02/15/2023 - Review Complete 02/15/2023  Allergen Reaction Noted   Buprenorphine-naloxone Other (See Comments) 09/03/2021   Varenicline Other (See Comments) 07/18/2022   Oxycodone Itching 05/11/2017   Tape Dermatitis 05/11/2017   Tetracyclines & related Other (See Comments) 05/11/2017   Vicodin [hydrocodone-acetaminophen] Itching 05/11/2017    Medications: Outpatient Encounter Medications as of 02/15/2023  Medication Sig   albuterol (VENTOLIN HFA) 108 (90 Base) MCG/ACT inhaler Inhale 2 puffs into the lungs every 6 (six) hours as needed for wheezing or shortness of breath.   anastrozole (ARIMIDEX) 1 MG tablet Take 1 mg by mouth daily.   capecitabine (XELODA) 500 MG tablet Take 1,000 mg/m2 by mouth 2 (two) times daily after a meal.   clobetasol ointment (TEMOVATE) [id number] % Apply 1 Application topically 2 (two) times daily.   clonazePAM (KLONOPIN) 1 MG tablet Take 1 mg by mouth 2 (two) times daily.   dapagliflozin propanediol (FARXIGA) 5 MG TABS tablet Take 5 mg by mouth daily.   diclofenac (VOLTAREN) 50 MG EC tablet Take 50 mg by mouth 2 (two) times daily.   diclofenac Sodium (VOLTAREN) 1 % GEL Apply 2 g topically 4 (four) times daily.   diphenoxylate-atropine (LOMOTIL) 2.5-0.025 MG tablet Take by mouth 4 (four) times  daily as needed for diarrhea or loose stools.   dorzolamide (TRUSOPT) 2 % ophthalmic solution 1 drop 3 (three) times daily.   esomeprazole (NEXIUM) 40 MG capsule Take 40 mg by mouth daily at 12 noon.   HYDROcodone-acetaminophen (NORCO) 10-325 MG tablet Take 1 tablet by mouth every 12 (twelve) hours as needed for severe pain or moderate pain.   lidocaine (LIDODERM) 5 % Place 1 patch onto the skin daily. Remove & Discard patch within 12 hours or as directed by MD   lisinopril-hydrochlorothiazide  (ZESTORETIC) 10-12.5 MG tablet Take 1 tablet by mouth daily.   magic mouthwash (lidocaine, diphenhydrAMINE, alum & mag hydroxide) suspension 5 mLs.   MAGNESIUM PO Take by mouth.   ondansetron (ZOFRAN) 4 MG tablet Take 4 mg by mouth every 8 (eight) hours as needed for nausea or vomiting.   potassium chloride (KLOR-CON) 20 MEQ packet Take by mouth QID.   pregabalin (LYRICA) 200 MG capsule Take 200 mg by mouth 2 (two) times daily.   prochlorperazine (COMPAZINE) 10 MG tablet Take 10 mg by mouth every 6 (six) hours as needed for nausea or vomiting.   senna (SENOKOT) 8.6 MG TABS tablet Take 1 tablet by mouth.   thiamine 50 MG tablet Take 50 mg by mouth daily.   traZODone (DESYREL) 50 MG tablet Take 50 mg by mouth at bedtime.   venlafaxine XR (EFFEXOR-XR) 150 MG 24 hr capsule Take 150 mg by mouth daily with breakfast.   No facility-administered encounter medications on file as of 02/15/2023.    Social History: Social History   Tobacco Use   Smoking status: Every Day    Packs/day: 1.00    Types: Cigarettes   Smokeless tobacco: Never  Substance Use Topics   Alcohol use: Yes    Alcohol/week: 20.0 standard drinks of alcohol    Types: 20 Glasses of wine per week   Drug use: No    Family Medical History: Family History  Problem Relation Age of Onset   Prostate cancer Father    Prostate cancer Maternal Grandfather    Cancer Other    Breast cancer Paternal Aunt    Lung cancer Maternal Uncle     Physical Examination: Vitals:   02/15/23 1405  BP: 136/72    General: Patient is well developed, well nourished, calm, collected, and in no apparent distress. Attention to examination is appropriate.  Respiratory: Patient is breathing without any difficulty.   NEUROLOGICAL:     Awake, alert, oriented to person, place, and time.  Speech is clear and fluent. Fund of knowledge is appropriate.   Cranial Nerves: Pupils equal round and reactive to light.  Facial tone is symmetric.    She has  moderate tenderness around the TL junction. She has diffuse lower lumbar tenderness as well.   ROM of lumbar spine not tested.   No abnormal lesions on exposed skin.   Strength: Side Biceps Triceps Deltoid Interossei Grip Wrist Ext. Wrist Flex.  R 5 5 5 5 5 5 5  $ L 5 5 5 5 5 5 5   $ Side Iliopsoas Quads Hamstring PF DF EHL  R 5 5 5 5 5 5  $ L 5 5 5 5 5 5   $ Reflexes are 2+ and symmetric at the biceps, triceps, brachioradialis, patella and achilles.   Hoffman's is absent.  Clonus is not present.   Bilateral upper and lower extremity sensation is intact to light touch.     Gait is slow, but normal.   Medical Decision  Making  Imaging: CT abdomen and pelvis 01/24/23:  FINDINGS:  LOWER CHEST: Please see dedicated CT chest for characterization of findings above the diaphragm, including left lower lobe mass.   LIVER: Normal liver contour.  No focal liver lesions.   BILIARY: The gallbladder is normal in appearance. No biliary ductal dilatation.    SPLEEN: Normal in size and contour. Splenule present.   PANCREAS: Normal pancreatic contour. Previously visualized pancreatic tail lesion is not visualized on current study.  No ductal dilation.   ADRENAL GLANDS: Left 1.4 cm adrenal nodule (6:34) similar to prior, unremarkable right adrenal gland.   KIDNEYS/URETERS: Symmetric renal enhancement. Lobulated renal contours.  No hydronephrosis.  No solid renal mass.   BLADDER: Underdistended, limiting evaluation.   REPRODUCTIVE ORGANS: Heterogeneous enhancement of the uterus with an area of increased enhancement along the posterior fundus and trace endometrial fluid, indeterminate. No suspicious adnexal masses.   GI TRACT: No findings of bowel obstruction or acute inflammation.  The appendix is not clearly identified but there are no secondary signs of appendicitis.   PERITONEUM, RETROPERITONEUM AND MESENTERY: No free air.  No ascites.  No fluid collection.   LYMPH NODES: No lymphadenopathy by  size criteria.   VESSELS: Hepatic and portal veins are patent.  Normal caliber aorta. Calcified atherosclerosis of the aorta and its branching vessels.    BONES and SOFT TISSUES: No aggressive osseous lesions. Moderate degenerative changes of the visualized spine, with grade 1 anterolisthesis of L5 on S1 and bilateral L5 pars interarticularis defects, as well as unchanged L1 lucent lesion likely representing hemangioma and T10 compression deformity with sclerosis.  No focal soft tissue lesions.   IMPRESSION:  --No findings to suggest new metastatic disease in the abdomen or pelvis.  --Heterogeneous uterine enhancement with more focal enhancement along the posterior fundal endometrium and trace endometrial fluid, indeterminant. Recommend pelvic ultrasound for further assessment.  -- Previously visualized pancreatic tail lesion is not visualized on current study.  -- Unchanged T10 compression deformity with sclerosis. No new osseous lesions.   Thoracic xrays dated 09/19/22:  FINDINGS: Thoracic spine alignment is normal. Mild age-indeterminate anterior wedge compression deformity of the T11 vertebral body. Mild multilevel degenerative changes. Procedure Note  Terrilyn Saver, MD - 09/19/2022 Formatting of this note might be different from the original. EXAM: XR THORACIC SPINE 2 VIEWS DATE: 09/19/2022 5:09 PM ACCESSION: [id number] UN DICTATED: 09/19/2022 5:19 PM INTERPRETATION LOCATION: [place]   Lumbar xrays dated 09/19/22:  FINDINGS:  Chronic bilateral L5 pars defects and grade 1 anterolisthesis of L5 on S1.  No lumbar compression fracture or vertebral subluxation.  Multilevel intervertebral narrowing with endplate sclerosis and osteophyte formation, most prominent at the L2-L3 level. Multilevel facet arthropathy.  Impression: 1. No acute lumbar compression fracture. 2. Chronic bilateral L5 spondylolysis and grade 1 anterolisthesis of L5 on S1. Procedure Note  Terrilyn Saver, MD - 09/19/2022 Formatting of this note might be different from the original. EXAM: XR LUMBAR SPINE 2 OR 3 VIEWS DATE: 09/19/2022 5:09 PM ACCESSION: [id number] UN DICTATED: 09/19/2022 5:47 PM INTERPRETATION LOCATION: [place]  I have personally reviewed the images and agree with the above interpretation.  Assessment and Plan: Barbara Malone is a pleasant 67 y.o. female has a history of breast CA with mets to brain and lungs. She is seeing oncology and palliative care.   She has 1 year history of constant mid back pain that can radiate into her lower back and intermittent  posterior leg pain to her knee that  that runs laterally to her feet. LBP > leg pain, left leg pain > right leg pain. 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 She has weakness in her legs with walking.   Chest CT and bone scan on 09/29/22 showed a new T10 compression fracture. This was also seen on thoracic xrays from 09/19/22. No injury that she remembers. 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   Mid back pain is likely from fracture at T10. Lower back and radicular leg pain is likely lumbar mediated.   Treatment options discussed with patient and following plan made:   - MRI of thoracic spine to further evaluate T10 compression fracture.  - MRI of lumbar spine to further evaluate lumbar radiculopathy.  - Will do MRIs without contrast as no evidence for mets.  - TLSO brace ordered at [facility]. She is to wear when standing/walking. Do not wear to sleep. Can remove if sitting and watching TV. - No bending, twisting, or lifting.  - Depending on results of thoracic MRI, may discuss option of kyphoplasty. May also consider possible lumbar injections.  - Will set up phone visit to review imaging once I have the results back.   I spent a total of 35 minutes in face-to-face and non-face-to-face activities related to this patient's care today including review of  outside records, review of imaging, review of symptoms, physical exam, discussion of differential diagnosis, discussion of treatment options, and documentation.   Thank you for involving me in the care of this patient.   Geronimo Boot PA-C Dept. of Neurosurgery

## 2023-02-13 ENCOUNTER — Ambulatory Visit
Admission: RE | Admit: 2023-02-13 | Discharge: 2023-02-13 | Disposition: A | Discharge status: Home / Self Care | Payer: Self-pay | Source: Ambulatory Visit | Attending: Orthopedic Surgery | Admitting: Orthopedic Surgery

## 2023-02-13 ENCOUNTER — Other Ambulatory Visit: Payer: Self-pay

## 2023-02-13 DIAGNOSIS — Z049 Encounter for examination and observation for unspecified reason: Secondary | ICD-10-CM

## 2023-02-15 ENCOUNTER — Ambulatory Visit (INDEPENDENT_AMBULATORY_CARE_PROVIDER_SITE_OTHER): Payer: 59 | Admitting: Orthopedic Surgery

## 2023-02-15 ENCOUNTER — Encounter: Payer: Self-pay | Admitting: Orthopedic Surgery

## 2023-02-15 VITALS — BP 136/72 | Ht 62.0 in | Wt 114.4 lb

## 2023-02-15 DIAGNOSIS — M5416 Radiculopathy, lumbar region: Secondary | ICD-10-CM | POA: Diagnosis not present

## 2023-02-15 DIAGNOSIS — S22070A Wedge compression fracture of T9-T10 vertebra, initial encounter for closed fracture: Secondary | ICD-10-CM

## 2023-02-15 DIAGNOSIS — M4316 Spondylolisthesis, lumbar region: Secondary | ICD-10-CM

## 2023-02-15 DIAGNOSIS — C50911 Malignant neoplasm of unspecified site of right female breast: Secondary | ICD-10-CM

## 2023-02-15 DIAGNOSIS — C7931 Secondary malignant neoplasm of brain: Secondary | ICD-10-CM | POA: Diagnosis not present

## 2023-02-15 NOTE — Progress Notes (Signed)
Order has been faxed to [facility]

## 2023-02-15 NOTE — Patient Instructions (Signed)
It was so nice to see you today. Thank you so much for coming in.    Your previous imaging showed a compression fracture at T10. You also have some wear and tear (arthritis) in your lower back as well.   I want to get an MRI of your mid and lower back to look into things further. We will get this approved through your insurance and [facility] will call you to schedule the appointment.   I sent an order for a TLSO brace to [facility] ([place] in [place]). They should call you about this, but if not then you can call them at [phone].   Once you get the brace, you can wear when you are standing and walking. Do not wear to sleep. You can remove if you are sitting and watching TV. I would avoid bending, twisting, and lifting.   Once I get the results back, we will call you to schedule a phone visit with me to review them.   Please do not hesitate to call if you have any questions or concerns. You can also message me in [place].   Geronimo Boot PA-C [phone]

## 2023-02-17 ENCOUNTER — Ambulatory Visit: Admit: 2023-02-17 | Discharge: 2023-02-17 | Payer: MEDICARE

## 2023-02-17 ENCOUNTER — Institutional Professional Consult (permissible substitution): Admit: 2023-02-17 | Discharge: 2023-02-17 | Payer: MEDICARE

## 2023-02-17 DIAGNOSIS — C50919 Malignant neoplasm of unspecified site of unspecified female breast: Principal | ICD-10-CM

## 2023-02-17 DIAGNOSIS — R7989 Other specified abnormal findings of blood chemistry: Principal | ICD-10-CM

## 2023-02-17 DIAGNOSIS — R110 Nausea: Principal | ICD-10-CM

## 2023-02-17 DIAGNOSIS — K900 Celiac disease: Principal | ICD-10-CM

## 2023-02-17 DIAGNOSIS — C50811 Malignant neoplasm of overlapping sites of right female breast: Principal | ICD-10-CM

## 2023-02-17 DIAGNOSIS — Z1159 Encounter for screening for other viral diseases: Principal | ICD-10-CM

## 2023-02-17 DIAGNOSIS — E039 Hypothyroidism, unspecified: Principal | ICD-10-CM

## 2023-02-17 DIAGNOSIS — C7802 Secondary malignant neoplasm of left lung: Principal | ICD-10-CM

## 2023-02-17 DIAGNOSIS — Z79899 Other long term (current) drug therapy: Secondary | ICD-10-CM | POA: Diagnosis not present

## 2023-02-17 DIAGNOSIS — F32A Depression, unspecified: Secondary | ICD-10-CM | POA: Diagnosis not present

## 2023-02-17 MED ORDER — CLONAZEPAM 0.5 MG TABLET
ORAL_TABLET | 0 refills | 0 days | Status: CP
Start: 2023-02-17 — End: ?

## 2023-02-22 MED ORDER — FARXIGA 5 MG TABLET
ORAL_TABLET | Freq: Every day | ORAL | 3 refills | 90 days | Status: CP
Start: 2023-02-22 — End: ?

## 2023-02-26 ENCOUNTER — Ambulatory Visit
Admission: RE | Admit: 2023-02-26 | Discharge: 2023-02-26 | Disposition: A | Discharge status: Home / Self Care | Payer: 59 | Source: Ambulatory Visit | Attending: Orthopedic Surgery | Admitting: Orthopedic Surgery

## 2023-02-26 DIAGNOSIS — C50911 Malignant neoplasm of unspecified site of right female breast: Secondary | ICD-10-CM | POA: Diagnosis not present

## 2023-02-26 DIAGNOSIS — S22070A Wedge compression fracture of T9-T10 vertebra, initial encounter for closed fracture: Secondary | ICD-10-CM | POA: Diagnosis not present

## 2023-02-26 DIAGNOSIS — M5416 Radiculopathy, lumbar region: Secondary | ICD-10-CM | POA: Diagnosis not present

## 2023-02-26 DIAGNOSIS — C7931 Secondary malignant neoplasm of brain: Secondary | ICD-10-CM | POA: Diagnosis not present

## 2023-02-26 DIAGNOSIS — M47816 Spondylosis without myelopathy or radiculopathy, lumbar region: Secondary | ICD-10-CM | POA: Diagnosis not present

## 2023-02-26 DIAGNOSIS — M5126 Other intervertebral disc displacement, lumbar region: Secondary | ICD-10-CM | POA: Diagnosis not present

## 2023-02-26 DIAGNOSIS — M4316 Spondylolisthesis, lumbar region: Secondary | ICD-10-CM | POA: Diagnosis not present

## 2023-02-26 DIAGNOSIS — M5124 Other intervertebral disc displacement, thoracic region: Secondary | ICD-10-CM | POA: Diagnosis not present

## 2023-02-27 DIAGNOSIS — G620 Drug-induced polyneuropathy: Principal | ICD-10-CM

## 2023-02-27 DIAGNOSIS — T451X5A Adverse effect of antineoplastic and immunosuppressive drugs, initial encounter: Principal | ICD-10-CM

## 2023-02-27 MED ORDER — PREGABALIN 200 MG CAPSULE
ORAL_CAPSULE | Freq: Three times a day (TID) | ORAL | 0 refills | 30 days | Status: CP
Start: 2023-02-27 — End: ?

## 2023-02-27 MED ORDER — CLONAZEPAM 0.5 MG TABLET
ORAL_TABLET | 0 refills | 0 days | Status: CP
Start: 2023-02-27 — End: ?

## 2023-02-28 DIAGNOSIS — F32A Depression, unspecified depression type: Principal | ICD-10-CM

## 2023-02-28 DIAGNOSIS — M532X4 Spinal instabilities, thoracic region: Secondary | ICD-10-CM | POA: Diagnosis not present

## 2023-02-28 MED ORDER — VENLAFAXINE ER 150 MG CAPSULE,EXTENDED RELEASE 24 HR
ORAL_CAPSULE | Freq: Every day | ORAL | 3 refills | 30 days | Status: CP
Start: 2023-02-28 — End: 2023-06-28

## 2023-03-08 ENCOUNTER — Encounter: Payer: Self-pay | Admitting: Family Medicine

## 2023-03-08 ENCOUNTER — Telehealth: Payer: Self-pay | Admitting: Orthopedic Surgery

## 2023-03-08 ENCOUNTER — Ambulatory Visit (INDEPENDENT_AMBULATORY_CARE_PROVIDER_SITE_OTHER): Payer: 59 | Admitting: Family Medicine

## 2023-03-08 VITALS — BP 134/79 | HR 67 | Temp 97.7°F | Ht 62.0 in | Wt 115.3 lb

## 2023-03-08 DIAGNOSIS — Z0000 Encounter for general adult medical examination without abnormal findings: Secondary | ICD-10-CM | POA: Diagnosis not present

## 2023-03-08 DIAGNOSIS — Z114 Encounter for screening for human immunodeficiency virus [HIV]: Secondary | ICD-10-CM

## 2023-03-08 DIAGNOSIS — D692 Other nonthrombocytopenic purpura: Secondary | ICD-10-CM

## 2023-03-08 DIAGNOSIS — Z136 Encounter for screening for cardiovascular disorders: Secondary | ICD-10-CM

## 2023-03-08 DIAGNOSIS — G8929 Other chronic pain: Secondary | ICD-10-CM

## 2023-03-08 DIAGNOSIS — H6121 Impacted cerumen, right ear: Secondary | ICD-10-CM

## 2023-03-08 DIAGNOSIS — Z1159 Encounter for screening for other viral diseases: Secondary | ICD-10-CM

## 2023-03-08 MED ORDER — POTASSIUM CHLORIDE CRYS ER 20 MEQ PO TBCR: 20.0000 meq | EXTENDED_RELEASE_TABLET | Freq: Four times a day (QID) | ORAL | 6 refills | Status: DC

## 2023-03-08 MED ORDER — LISINOPRIL-HYDROCHLOROTHIAZIDE 10-12.5 MG PO TABS: 1.0000 | ORAL_TABLET | Freq: Every day | ORAL | 1 refills | Status: DC

## 2023-03-08 NOTE — Telephone Encounter (Signed)
-----   Message from Peggyann Shoals sent at 03/06/2023 11:00 AM EDT ----- Left message for sister Abigail Butts to call the office ----- Message ----- From: Peggyann Shoals Sent: 03/01/2023  11:56 AM EDT To: Peggyann Shoals  Left message x2 ----- Message ----- From: Geronimo Boot, PA-C Sent: 02/27/2023   7:32 PM EST To: Peggyann Shoals  Please schedule her a phone visit with me to review her thoracic and lumbar MRI scans.   Thanks.

## 2023-03-08 NOTE — Telephone Encounter (Signed)
Patient has not returned our call to schedule phone visit. This encounter will be closed until patient calls back.

## 2023-03-08 NOTE — Patient Instructions (Signed)
[facility]  [address] [place], [place], [place] [address].  [phone])  Geronimo Boot PA-C was calling about the appointment.

## 2023-03-08 NOTE — Progress Notes (Signed)
BP 134/79   Pulse 67   Temp 97.7 F (36.5 C) (Oral)   Ht 5\' 2"  (1.575 m)   Wt 115 lb 4.8 oz (52.3 kg)   SpO2 98%   BMI 21.09 kg/m    Subjective:    Patient ID: Barbara Malone, female    DOB: 07/08/1956, 67 y.o.   MRN: [id number]  HPI: Barbara Malone is a 67 y.o. female presenting on 03/08/2023 for comprehensive medical examination. Current medical complaints include:none  Menopausal Symptoms: no  Functional Status Survey: Is the patient deaf or have difficulty hearing?: Yes Does the patient have difficulty seeing, even when wearing glasses/contacts?: Yes Does the patient have difficulty concentrating, remembering, or making decisions?: Yes Does the patient have difficulty walking or climbing stairs?: Yes Does the patient have difficulty dressing or bathing?: No Does the patient have difficulty doing errands alone such as visiting a doctor's office or shopping?: No     03/08/2023   11:02 AM  [place] in the past year? 0  Number falls in past yr: 0  Injury with Fall? 0  Risk for fall due to : History of fall(s)  Follow up Falls evaluation completed    Depression Screen    03/08/2023   10:15 AM  Depression screen PHQ 2/9  Decreased Interest 3  Down, Depressed, Hopeless 1  PHQ - 2 Score 4  Altered sleeping 3  Tired, decreased energy 2  Change in appetite 3  Feeling bad or failure about yourself  0  Trouble concentrating 0  Moving slowly or fidgety/restless 0  Suicidal thoughts 0  PHQ-9 Score 12  Difficult doing work/chores Somewhat difficult     Advanced Directives Does patient have a HCPOA?    yes If yes, name and contact information: her sisters Does patient have a living will or MOST form?  no  Past Medical History:  Past Medical History:  Diagnosis Date   Anxiety    Brain tumor ([place])    Cancer ([place])    breast, metastatic, active   Depression    still active; takes meds for depression;    Hyperlipidemia    controllled with medication;     Metastatic breast cancer    Peripheral neuropathy     Surgical History:  Past Surgical History:  Procedure Laterality Date   ABDOMINAL HYSTERECTOMY  1984   BILATERAL TOTAL MASTECTOMY WITH AXILLARY LYMPH NODE DISSECTION     BRAIN SURGERY  2019   CARPAL TUNNEL RELEASE     CESAREAN SECTION     TRIGGER FINGER RELEASE      Medications:  Current Outpatient Medications on File Prior to Visit  Medication Sig   albuterol (VENTOLIN HFA) 108 (90 Base) MCG/ACT inhaler Inhale 2 puffs into the lungs every 6 (six) hours as needed for wheezing or shortness of breath.   anastrozole (ARIMIDEX) 1 MG tablet Take 1 mg by mouth daily.   clobetasol ointment (TEMOVATE) [id number] % Apply 1 Application topically 2 (two) times daily.   clonazePAM (KLONOPIN) 1 MG tablet Take 1 mg by mouth 2 (two) times daily.   dapagliflozin propanediol (FARXIGA) 5 MG TABS tablet Take 5 mg by mouth daily.   diclofenac (VOLTAREN) 50 MG EC tablet Take 50 mg by mouth 2 (two) times daily.   diclofenac Sodium (VOLTAREN) 1 % GEL Apply 2 g topically 4 (four) times daily.   diphenoxylate-atropine (LOMOTIL) 2.5-0.025 MG tablet Take by mouth 4 (four) times daily as needed for diarrhea or loose stools.  dorzolamide (TRUSOPT) 2 % ophthalmic solution 1 drop 3 (three) times daily.   esomeprazole (NEXIUM) 40 MG capsule Take 40 mg by mouth daily at 12 noon.   HYDROcodone-acetaminophen (NORCO) 10-325 MG tablet Take 1 tablet by mouth every 12 (twelve) hours as needed for severe pain or moderate pain.   magic mouthwash (lidocaine, diphenhydrAMINE, alum & mag hydroxide) suspension 5 mLs.   MAGNESIUM PO Take by mouth.   ondansetron (ZOFRAN) 4 MG tablet Take 4 mg by mouth every 8 (eight) hours as needed for nausea or vomiting.   pregabalin (LYRICA) 200 MG capsule Take 200 mg by mouth 2 (two) times daily.   prochlorperazine (COMPAZINE) 10 MG tablet Take 10 mg by mouth every 6 (six) hours as needed for nausea or vomiting.   senna (SENOKOT) 8.6 MG TABS  tablet Take 1 tablet by mouth.   thiamine 50 MG tablet Take 50 mg by mouth daily.   traZODone (DESYREL) 50 MG tablet Take 50 mg by mouth at bedtime.   venlafaxine XR (EFFEXOR-XR) 150 MG 24 hr capsule Take 150 mg by mouth daily with breakfast.   No current facility-administered medications on file prior to visit.    Allergies:  Allergies  Allergen Reactions   Buprenorphine-Naloxone Other (See Comments)   Varenicline Other (See Comments)    Bad dreams   Oxycodone Itching   Tape Dermatitis   Tetracyclines & Related Other (See Comments)   Vicodin [Hydrocodone-Acetaminophen] Itching    Social History:  Social History   Socioeconomic History   Marital status: Divorced    Spouse name: Not on file   Number of children: Not on file   Years of education: Not on file   Highest education level: Not on file  Occupational History   Not on file  Tobacco Use   Smoking status: Every Day    Packs/day: 1    Types: Cigarettes   Smokeless tobacco: Never  Substance and Sexual Activity   Alcohol use: Yes    Alcohol/week: 20.0 standard drinks of alcohol    Types: 20 Glasses of wine per week   Drug use: No   Sexual activity: Not on file  Other Topics Concern   Not on file  Social History Narrative   Not on file   Social Determinants of Health   Financial Resource Strain: Not on file  Food Insecurity: Not on file  Transportation Needs: Not on file  Physical Activity: Not on file  Stress: Not on file  Social Connections: Not on file  Intimate Partner Violence: Not on file   Social History   Tobacco Use  Smoking Status Every Day   Packs/day: 1   Types: Cigarettes  Smokeless Tobacco Never   Social History   Substance and Sexual Activity  Alcohol Use Yes   Alcohol/week: 20.0 standard drinks of alcohol   Types: 20 Glasses of wine per week    Family History:  Family History  Problem Relation Age of Onset   Prostate cancer Father    Prostate cancer Maternal Grandfather     Cancer Other    Breast cancer Paternal Aunt    Lung cancer Maternal Uncle     Past medical history, surgical history, medications, allergies, family history and social history reviewed with patient today and changes made to appropriate areas of the chart.   Review of Systems  Constitutional: Negative.   HENT:  Positive for hearing loss. Negative for congestion, ear discharge, ear pain, nosebleeds, sinus pain, sore throat and tinnitus.  Eyes: Negative.   Respiratory:  Positive for wheezing. Negative for cough, hemoptysis, sputum production, shortness of breath and stridor.   Cardiovascular:  Positive for palpitations. Negative for chest pain, orthopnea, claudication, leg swelling and PND.  Gastrointestinal:  Positive for constipation (after chemo), diarrhea (after chemo), nausea (after chemo) and vomiting (after chemo). Negative for abdominal pain, blood in stool, heartburn and melena.  Genitourinary: Negative.   Musculoskeletal: Negative.   Skin:  Positive for rash. Negative for itching.  Neurological:  Positive for tingling. Negative for dizziness, tremors, sensory change, speech change, focal weakness, seizures, loss of consciousness, weakness and headaches.  Endo/Heme/Allergies:  Positive for environmental allergies and polydipsia. Bruises/bleeds easily.  Psychiatric/Behavioral: Negative.         Irritability    All other ROS negative except what is listed above and in the HPI.      Objective:    BP 134/79   Pulse 67   Temp 97.7 F (36.5 C) (Oral)   Ht 5\' 2"  (1.575 m)   Wt 115 lb 4.8 oz (52.3 kg)   SpO2 98%   BMI 21.09 kg/m   Wt Readings from Last 3 Encounters:  03/08/23 115 lb 4.8 oz (52.3 kg)  02/15/23 114 lb 6.4 oz (51.9 kg)  02/08/23 114 lb 4.8 oz (51.8 kg)    Physical Exam Vitals and nursing note reviewed.  Constitutional:      General: She is not in acute distress.    Appearance: Normal appearance. She is normal weight. She is not ill-appearing, toxic-appearing  or diaphoretic.  HENT:     Head: Normocephalic and atraumatic.     Right Ear: Tympanic membrane, ear canal and external ear normal. There is no impacted cerumen.     Left Ear: Tympanic membrane, ear canal and external ear normal. There is no impacted cerumen.     Nose: Nose normal. No congestion or rhinorrhea.     Mouth/Throat:     Mouth: Mucous membranes are moist.     Pharynx: Oropharynx is clear. No oropharyngeal exudate or posterior oropharyngeal erythema.  Eyes:     General: No scleral icterus.       Right eye: No discharge.        Left eye: No discharge.     Extraocular Movements: Extraocular movements intact.     Conjunctiva/sclera: Conjunctivae normal.     Pupils: Pupils are equal, round, and reactive to light.  Neck:     Vascular: No carotid bruit.  Cardiovascular:     Rate and Rhythm: Normal rate and regular rhythm.     Pulses: Normal pulses.     Heart sounds: No murmur heard.    No friction rub. No gallop.  Pulmonary:     Effort: Pulmonary effort is normal. No respiratory distress.     Breath sounds: Normal breath sounds. No stridor. No wheezing, rhonchi or rales.  Chest:     Chest wall: No tenderness.  Abdominal:     General: Abdomen is flat. Bowel sounds are normal. There is no distension.     Palpations: Abdomen is soft. There is no mass.     Tenderness: There is no abdominal tenderness. There is no right CVA tenderness, left CVA tenderness, guarding or rebound.     Hernia: No hernia is present.  Genitourinary:    Comments: Breast and pelvic exams deferred with shared decision making Musculoskeletal:        General: No swelling, tenderness, deformity or signs of injury.     Cervical  back: Normal range of motion and neck supple. No rigidity. No muscular tenderness.     Right lower leg: No edema.     Left lower leg: No edema.  Lymphadenopathy:     Cervical: No cervical adenopathy.  Skin:    General: Skin is warm and dry.     Capillary Refill: Capillary refill  takes less than 2 seconds.     Coloration: Skin is not jaundiced or pale.     Findings: No bruising, erythema, lesion or rash.  Neurological:     General: No focal deficit present.     Mental Status: She is alert and oriented to person, place, and time. Mental status is at baseline.     Cranial Nerves: No cranial nerve deficit.     Sensory: No sensory deficit.     Motor: No weakness.     Coordination: Coordination normal.     Gait: Gait normal.     Deep Tendon Reflexes: Reflexes normal.  Psychiatric:        Mood and Affect: Mood normal.        Behavior: Behavior normal.        Thought Content: Thought content normal.        Judgment: Judgment normal.        03/08/2023   11:03 AM  6CIT Screen  What Year? 0 points  What month? 0 points  What time? 0 points  Count back from 20 0 points  Months in reverse 4 points  Repeat phrase 4 points  Total Score 8 points    Results for orders placed or performed during the hospital encounter of 12/10/18  I-STAT, chem 8  Result Value Ref Range   Sodium 141 135 - 145 mmol/L   Potassium 3.8 3.5 - 5.1 mmol/L   Chloride 108 98 - 111 mmol/L   BUN <3 (L) 6 - 20 mg/dL   Creatinine, Ser 0.80 0.44 - 1.00 mg/dL   Glucose, Bld 91 70 - 99 mg/dL   Calcium, Ion 1.09 (L) 1.15 - 1.40 mmol/L   TCO2 25 22 - 32 mmol/L   Hemoglobin 13.6 12.0 - 15.0 g/dL   HCT 40.0 36.0 - 46.0 %      Assessment & Plan:   Problem List Items Addressed This Visit       Cardiovascular and Mediastinum   Senile purpura ([place])    Reassured patient. Continue to monitor.       Relevant Medications   lisinopril-hydrochlorothiazide (ZESTORETIC) 10-12.5 MG tablet   Other Visit Diagnoses     Encounter for annual wellness exam in Medicare patient    -  Primary   Preventative care discussed today as below.   Routine general medical examination at a health care facility       Vaccines up to date. Screening labs checked today. Pap and mammo N/A. Colonoscopy up to date.  Continue diet and exercise. Call with any concerns.   Chronic right ear pain       Cerumen impaction.   Impacted cerumen of right ear       Ear flushed today with good results.   Screening for cardiovascular condition       Labs drawn today. Await results.   Relevant Orders   Lipid Panel w/o Chol/HDL Ratio   Screening for HIV (human immunodeficiency virus)       Labs drawn today. Await results.   Relevant Orders   HIV Antibody (routine testing w rflx)   Encounter for hepatitis C  screening test for low risk patient       Labs drawn today. Await results.   Relevant Orders   Hepatitis C Antibody        Preventative Services:  Health Risk Assessment and Personalized Prevention Plan: Done today Bone Mass Measurements: Breast Cancer Screening: up to date CVD Screening: to be done at oncology Cervical Cancer Screening: N/A Colon Cancer Screening: Up to date Depression Screening: Done today Diabetes Screening: To be done with blood work Glaucoma Screening: See eye doctor Hepatitis B vaccine: N/A Hepatitis C screening: To be done with blood work HIV Screening:To be done with blood work Flu Vaccine: Up to date Lung cancer Screening: up to date Obesity Screening: done today Pneumonia Vaccines (2): N/A STI Screening: N/A  Follow up plan: Return in about 6 months (around 09/08/2023).   LABORATORY TESTING:  - Pap smear: not applicable  IMMUNIZATIONS:   - Tdap: Tetanus vaccination status reviewed: Refused. - Influenza: Up to date - Pneumovax: Up to date - Prevnar: Not applicable - Zostavax vaccine: Not applicable  SCREENING: -Mammogram: Not applicable  - Colonoscopy: Up to date  - Bone Density: Not applicable   PATIENT COUNSELING:   Advised to take 1 mg of folate supplement per day if capable of pregnancy.   Sexuality: Discussed sexually transmitted diseases, partner selection, use of condoms, avoidance of unintended pregnancy  and contraceptive alternatives.   Advised  to avoid cigarette smoking.  I discussed with the patient that most people either abstain from alcohol or drink within safe limits (<=14/week and <=4 drinks/occasion for males, <=7/weeks and <= 3 drinks/occasion for females) and that the risk for alcohol disorders and other health effects rises proportionally with the number of drinks per week and how often a drinker exceeds daily limits.  Discussed cessation/primary prevention of drug use and availability of treatment for abuse.   Diet: Encouraged to adjust caloric intake to maintain  or achieve ideal body weight, to reduce intake of dietary saturated fat and total fat, to limit sodium intake by avoiding high sodium foods and not adding table salt, and to maintain adequate dietary potassium and calcium preferably from fresh fruits, vegetables, and low-fat dairy products.    stressed the importance of regular exercise  Injury prevention: Discussed safety belts, safety helmets, smoke detector, smoking near bedding or upholstery.   Dental health: Discussed importance of regular tooth brushing, flossing, and dental visits.    NEXT PREVENTATIVE PHYSICAL DUE IN 1 YEAR. Return in about 6 months (around 09/08/2023).

## 2023-03-10 ENCOUNTER — Ambulatory Visit: Admit: 2023-03-10 | Discharge: 2023-03-10 | Payer: MEDICARE

## 2023-03-10 ENCOUNTER — Institutional Professional Consult (permissible substitution): Admit: 2023-03-10 | Discharge: 2023-03-10 | Payer: MEDICARE

## 2023-03-10 DIAGNOSIS — C50811 Malignant neoplasm of overlapping sites of right female breast: Principal | ICD-10-CM

## 2023-03-10 DIAGNOSIS — E039 Hypothyroidism, unspecified: Principal | ICD-10-CM

## 2023-03-10 DIAGNOSIS — R7989 Other specified abnormal findings of blood chemistry: Principal | ICD-10-CM

## 2023-03-10 DIAGNOSIS — C50919 Malignant neoplasm of unspecified site of unspecified female breast: Principal | ICD-10-CM

## 2023-03-10 DIAGNOSIS — E785 Hyperlipidemia, unspecified: Principal | ICD-10-CM

## 2023-03-10 DIAGNOSIS — Z1159 Encounter for screening for other viral diseases: Principal | ICD-10-CM

## 2023-03-10 DIAGNOSIS — C7802 Secondary malignant neoplasm of left lung: Principal | ICD-10-CM

## 2023-03-10 DIAGNOSIS — R110 Nausea: Principal | ICD-10-CM

## 2023-03-10 DIAGNOSIS — K900 Celiac disease: Principal | ICD-10-CM

## 2023-03-10 DIAGNOSIS — Z171 Estrogen receptor negative status [ER-]: Secondary | ICD-10-CM | POA: Diagnosis not present

## 2023-03-10 DIAGNOSIS — F17210 Nicotine dependence, cigarettes, uncomplicated: Secondary | ICD-10-CM | POA: Diagnosis not present

## 2023-03-10 DIAGNOSIS — R519 Headache, unspecified: Secondary | ICD-10-CM | POA: Diagnosis not present

## 2023-03-10 DIAGNOSIS — K7460 Unspecified cirrhosis of liver: Secondary | ICD-10-CM | POA: Diagnosis not present

## 2023-03-10 DIAGNOSIS — M85859 Other specified disorders of bone density and structure, unspecified thigh: Secondary | ICD-10-CM | POA: Diagnosis not present

## 2023-03-10 DIAGNOSIS — F32A Depression, unspecified: Secondary | ICD-10-CM | POA: Diagnosis not present

## 2023-03-10 DIAGNOSIS — R197 Diarrhea, unspecified: Secondary | ICD-10-CM | POA: Diagnosis not present

## 2023-03-10 DIAGNOSIS — S22089D Unspecified fracture of T11-T12 vertebra, subsequent encounter for fracture with routine healing: Secondary | ICD-10-CM | POA: Diagnosis not present

## 2023-03-10 DIAGNOSIS — E876 Hypokalemia: Secondary | ICD-10-CM | POA: Diagnosis not present

## 2023-03-10 DIAGNOSIS — G629 Polyneuropathy, unspecified: Secondary | ICD-10-CM | POA: Diagnosis not present

## 2023-03-10 DIAGNOSIS — Z5112 Encounter for antineoplastic immunotherapy: Secondary | ICD-10-CM | POA: Diagnosis not present

## 2023-03-10 DIAGNOSIS — C7931 Secondary malignant neoplasm of brain: Secondary | ICD-10-CM | POA: Diagnosis not present

## 2023-03-12 ENCOUNTER — Encounter: Payer: Self-pay | Admitting: Family Medicine

## 2023-03-12 DIAGNOSIS — D692 Other nonthrombocytopenic purpura: Secondary | ICD-10-CM | POA: Insufficient documentation

## 2023-03-12 NOTE — Assessment & Plan Note (Signed)
Reassured patient. Continue to monitor.  

## 2023-03-12 NOTE — Progress Notes (Unsigned)
Telephone Visit- Progress Note: Referring Physician:  Valerie Roys, DO [place],  [place] [id number]  Primary Physician:  Valerie Roys, DO  This visit was performed via telephone.  Patient location: home Provider location: office  I spent a total of 15 minutes non-face-to-face activities for this visit on the date of this encounter including review of current clinical condition and response to treatment.    Patient has given verbal consent to this telephone visits and we reviewed the limitations of a telephone visit. Patient wishes to proceed.    Chief Complaint:  review MRI results  History of Present Illness: Barbara Malone is a 67 y.o. female has a history of breast CA with mets to brain and lungs. She is seeing oncology and palliative care.    Also with history of HTN, hyperlipidemia, drug induced peripheral neuropathy, mitral valve mass, and pulmonary HTN.   Last seen by me on 02/15/23 for  1 year history of constant mid back pain that can radiate into her lower back and intermittent  posterior leg pain to her knee that that runs laterally to her feet.   Chest CT and bone scan on 09/29/22 showed a new T10 compression fracture. This was also seen on thoracic xrays from 09/19/22. No injury that she remembers. 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   TLSO brace ordered for her to wear. No bending, twisting, or lifting. MRIs of thoracic and lumbar spine ordered. Phone visit scheduled to review the results.   She is feeling better with the brace. She still has pain in her back when she is not wearing it.   Her primary complaint is pain in the mid back. Her lower back and bilateral leg pain is more tolerable (she's had this for years).   Exam: No exam done as this was a telephone encounter.     Imaging: Thoracic and lumbar MRI dated 02/26/23:  FINDINGS: MRI THORACIC SPINE FINDINGS   Alignment:  Physiologic.   Vertebrae:  Subacute T11 vertebral body compression fracture with approximately 10% anterior height loss and marrow edema along the superior half of the vertebral body.   Cord:  Normal signal and morphology.   Paraspinal and other soft tissues: No acute paraspinal abnormality.   Disc levels:   Disc spaces: Mild degenerative disease with disc height loss throughout the thoracic spine.   T1-T2: No disc protrusion, foraminal stenosis or central canal stenosis.   T2-T3: Minimal broad-based disc bulge. No foraminal or central canal stenosis.   T3-T4: Tiny left paracentral disc protrusion. No foraminal or central canal stenosis.   T4-T5: Minimal broad-based disc bulge. No foraminal or central canal stenosis.   T5-T6: Minimal broad-based disc bulge. No foraminal or central canal stenosis.   T6-T7: Minimal broad-based disc bulge. No foraminal or central canal stenosis.   T7-T8: Broad central/left paracentral shallow disc protrusion. No foraminal or central canal stenosis.   T8-T9: Broad central disc protrusion. No foraminal or central canal stenosis.   T9-T10: Broad-based disc bulge with a tiny central disc protrusion. No foraminal or central canal stenosis.   T10-T11: No disc protrusion, foraminal stenosis or central canal stenosis.   T11-T12: No disc protrusion, foraminal stenosis or central canal stenosis.   MRI LUMBAR SPINE FINDINGS   Segmentation:  Standard.   Alignment:  Minimal grade 1 anterolisthesis of L5 on S1.   Vertebrae: No acute fracture, evidence of discitis, or aggressive bone lesion.  Conus medullaris and cauda equina: Conus extends to the L1 level. Conus and cauda equina appear normal.   Paraspinal and other soft tissues: No acute paraspinal abnormality.   Disc levels:   Disc spaces: Degenerative disease with disc height loss at T12-L1, L1-2, L2-3 and L5-S1.   T12-L1: Mild broad-based disc bulge. No foraminal or central canal stenosis.   L1-L2: Mild  broad-based disc bulge. No foraminal or central canal stenosis.   L2-L3: Broad-based disc bulge. Mild bilateral facet arthropathy. Mild spinal stenosis. No foraminal stenosis.   L3-L4: Mild broad-based disc bulge. Mild bilateral lateral recess narrowing. No foraminal or central canal stenosis.   L4-L5: No significant disc bulge. No neural foraminal stenosis. No central canal stenosis.   L5-S1: Broad-based disc bulge with a small central disc protrusion. Moderate-severe left foraminal stenosis. Severe right foraminal stenosis. Mild bilateral facet arthropathy.   IMPRESSION: MR THORACIC SPINE IMPRESSION   1. Subacute T11 vertebral body compression fracture with approximately 10% anterior height loss and marrow edema along the superior half of the vertebral body. 2. Mild thoracic spine spondylosis as described above.   MR LUMBAR SPINE IMPRESSION   1. Lumbar spine spondylosis as described above. 2. No acute osseous injury of the lumbar spine.     Electronically Signed   By: Kathreen Devoid M.D.   On: 02/27/2023 11:26   I have personally reviewed the images and agree with the above interpretation.  Assessment and Plan: [name] is a pleasant 67 y.o. female with a history of breast CA with mets to brain and lungs. She is seeing oncology and palliative care.    She has 1 year history of constant mid back pain that can radiate into her lower back and intermittent  posterior leg pain to her knee that that runs laterally to her feet. 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  Currently, her primary complaint of pain is in her mid back. She has seen some improvement with TLSO brace.   MRI shows subacute T11 vertebral body compression fracture with approximately 10% anterior height loss and marrow edema along the superior half of the vertebral body.  She also has lumbar spondylosis. LBP is likely multifactorial. Leg pain may be from foraminal stenosis at L5-S1.   Treatment  options discussed with patient and her sister. Following plan made:   - Referral to IR to discuss T11 kyphoplasty.  - Continue with TLSO brace for now. No bending, twisting, or lifting.  - Will call her in 4 weeks to check on her. If she is still having LBP/leg pain, may refer to pain management for possible lumbar injections.   Geronimo Boot PA-C Neurosurgery

## 2023-03-13 ENCOUNTER — Encounter: Payer: Self-pay | Admitting: Orthopedic Surgery

## 2023-03-13 ENCOUNTER — Ambulatory Visit (INDEPENDENT_AMBULATORY_CARE_PROVIDER_SITE_OTHER): Payer: 59 | Admitting: Orthopedic Surgery

## 2023-03-13 DIAGNOSIS — M5416 Radiculopathy, lumbar region: Secondary | ICD-10-CM | POA: Diagnosis not present

## 2023-03-13 DIAGNOSIS — S22080D Wedge compression fracture of T11-T12 vertebra, subsequent encounter for fracture with routine healing: Secondary | ICD-10-CM | POA: Diagnosis not present

## 2023-03-13 DIAGNOSIS — M47816 Spondylosis without myelopathy or radiculopathy, lumbar region: Secondary | ICD-10-CM | POA: Diagnosis not present

## 2023-03-14 DIAGNOSIS — R519 Chronic nonintractable headache, unspecified headache type: Principal | ICD-10-CM

## 2023-03-14 DIAGNOSIS — G8929 Other chronic pain: Principal | ICD-10-CM

## 2023-03-14 MED ORDER — BUTALBITAL-ACETAMINOPHEN-CAFFEINE 50 MG-300 MG-40 MG CAPSULE
ORAL_CAPSULE | Freq: Four times a day (QID) | ORAL | 0 refills | 8 days | Status: CP | PRN
Start: 2023-03-14 — End: ?

## 2023-03-20 ENCOUNTER — Telehealth: Admit: 2023-03-20 | Discharge: 2023-03-21 | Payer: MEDICARE

## 2023-03-20 ENCOUNTER — Other Ambulatory Visit: Payer: Self-pay | Admitting: Orthopedic Surgery

## 2023-03-20 DIAGNOSIS — R519 Headache, unspecified: Secondary | ICD-10-CM | POA: Diagnosis not present

## 2023-03-20 DIAGNOSIS — M47816 Spondylosis without myelopathy or radiculopathy, lumbar region: Secondary | ICD-10-CM

## 2023-03-20 DIAGNOSIS — Z515 Encounter for palliative care: Secondary | ICD-10-CM | POA: Diagnosis not present

## 2023-03-20 DIAGNOSIS — M5416 Radiculopathy, lumbar region: Secondary | ICD-10-CM

## 2023-03-20 DIAGNOSIS — C50919 Malignant neoplasm of unspecified site of unspecified female breast: Secondary | ICD-10-CM | POA: Diagnosis not present

## 2023-03-20 DIAGNOSIS — S22080D Wedge compression fracture of T11-T12 vertebra, subsequent encounter for fracture with routine healing: Secondary | ICD-10-CM

## 2023-03-20 MED ORDER — BUTALBITAL-ACETAMINOPHEN-CAFFEINE 50 MG-325 MG-40 MG TABLET
ORAL_TABLET | Freq: Three times a day (TID) | ORAL | 0 refills | 10 days | Status: CP | PRN
Start: 2023-03-20 — End: ?

## 2023-03-27 DIAGNOSIS — G620 Drug-induced polyneuropathy: Principal | ICD-10-CM

## 2023-03-27 DIAGNOSIS — R519 Frontal headache: Principal | ICD-10-CM

## 2023-03-27 DIAGNOSIS — T451X5A Adverse effect of antineoplastic and immunosuppressive drugs, initial encounter: Principal | ICD-10-CM

## 2023-03-27 MED ORDER — BUTALBITAL-ACETAMINOPHEN-CAFFEINE 50 MG-325 MG-40 MG TABLET
ORAL_TABLET | Freq: Three times a day (TID) | ORAL | 0 refills | 10 days | Status: CP | PRN
Start: 2023-03-27 — End: ?

## 2023-03-27 MED ORDER — PREGABALIN 200 MG CAPSULE
ORAL_CAPSULE | Freq: Three times a day (TID) | ORAL | 0 refills | 30 days | Status: CP
Start: 2023-03-27 — End: ?

## 2023-03-27 MED ORDER — CLONAZEPAM 0.5 MG TABLET
ORAL_TABLET | 0 refills | 0 days | Status: CP
Start: 2023-03-27 — End: ?

## 2023-03-28 ENCOUNTER — Ambulatory Visit
Admission: RE | Admit: 2023-03-28 | Discharge: 2023-03-28 | Disposition: A | Discharge status: Home / Self Care | Payer: 59 | Source: Ambulatory Visit | Attending: Orthopedic Surgery | Admitting: Orthopedic Surgery

## 2023-03-28 DIAGNOSIS — C50811 Malignant neoplasm of overlapping sites of right female breast: Principal | ICD-10-CM

## 2023-03-28 DIAGNOSIS — S22080D Wedge compression fracture of T11-T12 vertebra, subsequent encounter for fracture with routine healing: Secondary | ICD-10-CM

## 2023-03-28 DIAGNOSIS — M5416 Radiculopathy, lumbar region: Secondary | ICD-10-CM

## 2023-03-28 DIAGNOSIS — S22080A Wedge compression fracture of T11-T12 vertebra, initial encounter for closed fracture: Secondary | ICD-10-CM | POA: Diagnosis not present

## 2023-03-28 DIAGNOSIS — M47816 Spondylosis without myelopathy or radiculopathy, lumbar region: Secondary | ICD-10-CM

## 2023-03-28 DIAGNOSIS — M8458XA Pathological fracture in neoplastic disease, other specified site, initial encounter for fracture: Secondary | ICD-10-CM | POA: Diagnosis not present

## 2023-03-28 HISTORY — PX: IR RADIOLOGIST EVAL & MGMT: IMG5224

## 2023-03-28 NOTE — H&P (Signed)
Interventional Radiology - Clinic Visit, Initial H&P    Referring Provider: Geronimo Boot, PA-C  Reason for Visit: Compression fracture    History of Present Illness  Barbara Malone is a 67 y.o. female with a relevant past medical history of metastatic breast cancer seen today in Interventional Radiology clinic for back pain and compression fracture.  The patient is being seen today for symptomatic T11 compression fracture.  Patient was diagnosed with breast cancer in February 2017, with metastatic disease to the brain and lungs.  She is currently undergoing a chemotherapy regimen every third week, and is status post radiation to the brain.  She began to have progressive lower back pain, and CT scan on September 29, 2022 demonstrated new compression fracture of the T11 vertebral body, possibly due to metastatic disease.  Follow-up nuclear medicine bone scans, most recently on January 24, 2023, demonstrated persistent radiotracer uptake compatible with the T11 fracture.  Most recent imaging included an MRI of the thoracic and lumbar spine on February 26, 2023 confirming persistent subacute fracture at T11.    She denies having had any radiation treatment of the spine.  She rates pain in the mid to lower back that is 10/10 at times.  She takes hydrocodone intermittently with some relief, but is limited in use due to headache and constipation side-effects.  She rates severe disability on the [employer] disability questionnaire with 24/24 positive.   The patient has been referred by neurosurgery, who has prescribed her a TLSO brace for which she has been compliant.     Additional Past Medical History Past Medical History:  Diagnosis Date   Anxiety    Brain tumor ([place])    Cancer ([place])    breast, metastatic, active   Depression    still active; takes meds for depression;    Hyperlipidemia    controllled with medication;    Metastatic breast cancer    Peripheral neuropathy      Surgical History   Past Surgical History:  Procedure Laterality Date   ABDOMINAL HYSTERECTOMY  1984   BILATERAL TOTAL MASTECTOMY WITH AXILLARY LYMPH NODE DISSECTION     BRAIN SURGERY  2019   CARPAL TUNNEL RELEASE     CESAREAN SECTION     TRIGGER FINGER RELEASE       Medications  I have reviewed the current medication list. Refer to chart for details. Current Outpatient Medications  Medication Instructions   albuterol (VENTOLIN HFA) 108 (90 Base) MCG/ACT inhaler 2 puffs, Inhalation, Every 6 hours PRN   anastrozole (ARIMIDEX) 1 mg, Oral, Daily   clobetasol ointment (TEMOVATE) [id number] % 1 Application, Topical, 2 times daily   clonazePAM (KLONOPIN) 1 mg, Oral, 2 times daily   dapagliflozin propanediol (FARXIGA) 5 mg, Oral, Daily   diclofenac (VOLTAREN) 50 mg, Oral, 2 times daily   diclofenac Sodium (VOLTAREN) 2 g, Topical, 4 times daily   diphenoxylate-atropine (LOMOTIL) 2.5-0.025 MG tablet Oral, 4 times daily PRN   dorzolamide (TRUSOPT) 2 % ophthalmic solution 1 drop, 3 times daily   esomeprazole (NEXIUM) 40 mg, Oral, Daily   HYDROcodone-acetaminophen (NORCO) 10-325 MG tablet 1 tablet, Oral, Every 12 hours PRN   lisinopril-hydrochlorothiazide (ZESTORETIC) 10-12.5 MG tablet 1 tablet, Oral, Daily   magic mouthwash (lidocaine, diphenhydrAMINE, alum & mag hydroxide) suspension 5 mLs   MAGNESIUM PO Oral   ondansetron (ZOFRAN) 4 mg, Oral, Every 8 hours PRN   potassium chloride SA (KLOR-CON M) 20 MEQ tablet 20 mEq, Oral, 4 times daily   pregabalin (LYRICA)  200 mg, Oral, 2 times daily   prochlorperazine (COMPAZINE) 10 mg, Oral, Every 6 hours PRN   senna (SENOKOT) 8.6 MG TABS tablet 1 tablet, Oral   thiamine 50 mg, Oral, Daily   traZODone (DESYREL) 50 mg, Oral, Daily at bedtime   venlafaxine XR (EFFEXOR-XR) 150 mg, Oral, Daily with breakfast      Allergies Allergies  Allergen Reactions   Buprenorphine-Naloxone Other (See Comments)   Varenicline Other (See Comments)    Bad dreams   Oxycodone Itching    Tape Dermatitis   Tetracyclines & Related Other (See Comments)   Vicodin [Hydrocodone-Acetaminophen] Itching   Decadron [Dexamethasone] Anxiety    Mania   Does patient have contrast allergy: No     Physical Exam Current Vitals Temp: 98 F (36.7 C) ( )  Pulse Rate: 75  Resp: 16  BP: 129/63  SpO2: 95 %        There is no height or weight on file to calculate BMI.  General: Alert and answers questions appropriately. No apparent distress. HEENT: Normocephalic, atraumatic. Conjunctivae normal without scleral icterus. Cardiac: Regular rate and rhythm. No dependent edema. Pulmonary: Normal work of breathing. On room air. Back: Focal tenderness overlying the mid to lower back at expected location of T11.    Pertinent Lab Results    Latest Ref Rng & Units 12/10/2018    3:05 AM 09/16/2017    3:20 PM  CBC  WBC 3.6 - 11.0 K/uL  5.8   Hemoglobin 12.0 - 15.0 g/dL 13.6  13.6   Hematocrit 36.0 - 46.0 % 40.0  38.6   Platelets 150 - 440 K/uL  167       Latest Ref Rng & Units 12/10/2018    3:05 AM 09/16/2017    3:20 PM  CMP  Glucose 70 - 99 mg/dL 91  98   BUN 6 - 20 mg/dL <3  <5   Creatinine 0.44 - 1.00 mg/dL 0.80  0.47   Sodium 135 - 145 mmol/L 141  131   Potassium 3.5 - 5.1 mmol/L 3.8  3.5   Chloride 98 - 111 mmol/L 108  97   CO2 22 - 32 mmol/L  22   Calcium 8.9 - 10.3 mg/dL  8.4       Relevant and/or Recent Imaging: MRI T/L spine 02/26/2023 IMPRESSION: MR THORACIC SPINE IMPRESSION 1. Subacute T11 vertebral body compression fracture with approximately 10% anterior height loss and marrow edema along the superior half of the vertebral body. 2. Mild thoracic spine spondylosis as described above. MR LUMBAR SPINE IMPRESSION 1. Lumbar spine spondylosis as described above. 2. No acute osseous injury of the lumbar spine. Electronically Signed By: Kathreen Devoid M.D. On: 02/27/2023 11:26     Assessment & Plan:   Patient has suffered subacute malignant fracture due to  secondary neoplasm of the spine of the T11 vertebra.   Based on history of metastatic breast cancer, this is presumed pathologic fracture secondary to metastatic involvement of the T11 vertebral body.    History and exam have demonstrated the following:  Acute/Subacute fracture by imaging dated 02/26/2023, Pain on exam concordant with level of fracture, Failure of conservative therapy (including TLSO bracing) and pain refractory to narcotic pain mediation, Inability to tolerate narcotic pain medication due to side effects, and Significant disability on the [place] with 24/24 positive symptoms, reflecting significant impact/impairment of (ADLs)   ICD-10-CM Codes that Support Medical Necessity ([email or web address])  M84.58XA    Pathological fracture in neoplastic disease,  other specified site, initial encounter for fracture and S22.080A    Wedge compression fracture of T11-T12 vertebra, initial encounter for closed fracture    Plan:  T11 OsteoCool (biposy, RFA, and vertebral body augmentation with balloon kyphoplasty)  Post-procedure disposition: outpatient DRI-A  Medication holds: TBD  The patient has suffered a fracture of the T11 vertebral body. It is recommended that patients aged 73 years or older be evaluated for possible testing or treatment of osteoporosis. A copy of this consult report is sent to the patient's referring physician.  Advanced Care Plan: The patient did not want to provide an [place] at the time of this visit     Total time spent on today's visit was over 60 Minutes, including both face-to-face time and non face-to-face time, personally spent on review of chart (including labs and relevant imaging), discussing further workup and treatment options, referral to specialist if needed, reviewing outside records if pertinent, answering patient questions, and coordinating care  regarding T11 fracture as well as management strategy.       Albin Felling, MD  Vascular and Interventional Radiology 03/28/2023 12:59 PM

## 2023-03-31 ENCOUNTER — Ambulatory Visit: Admit: 2023-03-31 | Discharge: 2023-03-31 | Payer: MEDICARE

## 2023-03-31 ENCOUNTER — Institutional Professional Consult (permissible substitution): Admit: 2023-03-31 | Discharge: 2023-03-31 | Payer: MEDICARE

## 2023-03-31 DIAGNOSIS — E039 Hypothyroidism, unspecified: Principal | ICD-10-CM

## 2023-03-31 DIAGNOSIS — R110 Nausea: Principal | ICD-10-CM

## 2023-03-31 DIAGNOSIS — C50811 Malignant neoplasm of overlapping sites of right female breast: Principal | ICD-10-CM

## 2023-03-31 DIAGNOSIS — C50919 Malignant neoplasm of unspecified site of unspecified female breast: Principal | ICD-10-CM

## 2023-03-31 DIAGNOSIS — K900 Celiac disease: Principal | ICD-10-CM

## 2023-03-31 DIAGNOSIS — Z1159 Encounter for screening for other viral diseases: Principal | ICD-10-CM

## 2023-03-31 DIAGNOSIS — R7989 Other specified abnormal findings of blood chemistry: Principal | ICD-10-CM

## 2023-04-05 NOTE — Discharge Instructions (Signed)
Kyphoplasty Post Procedure Discharge Instructions  May resume a regular diet and any medications that you routinely take (including pain medications). However, if you are taking Aspirin or an anticoagulant/blood thinner you will be told when you can resume taking these by the healthcare provider. No driving day of procedure. The day of your procedure take it easy. You may use an ice pack as needed to injection sites on back.  Ice to back 30 minutes on and 30 minutes off, as needed. May remove bandaids tomorrow after taking a shower. Replace daily with a clean bandaid until healed.  Do not lift anything heavier than a milk jug for 1-2 weeks or determined by your physician.  Follow up with your physician in 2 weeks.    Please contact our office at [phone] for the following symptoms or if you have any questions:  Fever greater than 100 degrees Increased swelling, pain, or redness at injection site. Increased back and/or leg pain New numbness or change in symptoms from before the procedure.    Thank you for visiting [facility]. 

## 2023-04-06 ENCOUNTER — Other Ambulatory Visit (HOSPITAL_COMMUNITY)
Admission: RE | Admit: 2023-04-06 | Discharge: 2023-04-06 | Disposition: A | Discharge status: Home / Self Care | Payer: 59 | Source: Ambulatory Visit | Attending: Orthopedic Surgery | Admitting: Orthopedic Surgery

## 2023-04-06 ENCOUNTER — Ambulatory Visit
Admission: RE | Admit: 2023-04-06 | Discharge: 2023-04-06 | Disposition: A | Discharge status: Home / Self Care | Payer: 59 | Source: Ambulatory Visit | Attending: Orthopedic Surgery | Admitting: Orthopedic Surgery

## 2023-04-06 ENCOUNTER — Other Ambulatory Visit: Payer: Self-pay | Admitting: Orthopedic Surgery

## 2023-04-06 DIAGNOSIS — M47816 Spondylosis without myelopathy or radiculopathy, lumbar region: Secondary | ICD-10-CM

## 2023-04-06 DIAGNOSIS — S22080A Wedge compression fracture of T11-T12 vertebra, initial encounter for closed fracture: Secondary | ICD-10-CM

## 2023-04-06 DIAGNOSIS — C50919 Malignant neoplasm of unspecified site of unspecified female breast: Secondary | ICD-10-CM | POA: Diagnosis not present

## 2023-04-06 DIAGNOSIS — S22080D Wedge compression fracture of T11-T12 vertebra, subsequent encounter for fracture with routine healing: Secondary | ICD-10-CM | POA: Insufficient documentation

## 2023-04-06 DIAGNOSIS — M5416 Radiculopathy, lumbar region: Secondary | ICD-10-CM

## 2023-04-06 HISTORY — PX: IR BONE TUMOR(S)RF ABLATION: IMG2284

## 2023-04-06 HISTORY — PX: IR KYPHO THORACIC WITH BONE BIOPSY: IMG5518

## 2023-04-06 MED ORDER — MIDAZOLAM HCL 2 MG/2ML IJ SOLN
1.0000 mg | INTRAMUSCULAR | Status: DC | PRN
  Administered 2023-04-06 (×2): 1 mg via INTRAVENOUS

## 2023-04-06 MED ORDER — SODIUM CHLORIDE 0.9 % IV SOLN: INTRAVENOUS | Status: DC

## 2023-04-06 MED ORDER — CEFAZOLIN SODIUM-DEXTROSE 2-4 GM/100ML-% IV SOLN
2.0000 g | INTRAVENOUS | Status: AC
  Administered 2023-04-06: 2 g via INTRAVENOUS

## 2023-04-06 MED ORDER — KETOROLAC TROMETHAMINE 30 MG/ML IJ SOLN
30.0000 mg | Freq: Once | INTRAMUSCULAR | Status: AC
  Administered 2023-04-06: 30 mg via INTRAVENOUS

## 2023-04-06 MED ORDER — FENTANYL CITRATE PF 50 MCG/ML IJ SOSY
25.0000 ug | PREFILLED_SYRINGE | INTRAMUSCULAR | Status: DC | PRN
  Administered 2023-04-06 (×2): 50 ug via INTRAVENOUS

## 2023-04-06 NOTE — Progress Notes (Signed)
Pt back in nursing recovery area. Pt still drowsy from procedure but will wake up when spoken to. Pt follows commands, talks in complete sentences and has no complaints at this time. Pt will remain in nursing station until discharge.  ?

## 2023-04-11 ENCOUNTER — Telehealth: Payer: Self-pay | Admitting: Orthopedic Surgery

## 2023-04-11 LAB — SURGICAL PATHOLOGY

## 2023-04-11 NOTE — Telephone Encounter (Signed)
Pathology is back from biopsy of T11 done during kyphoplasty. It is negative for metastatic carcinoma.   Called patient to let her know and see how she is doing. LM for her to return my call. Of note, number is her chart is for her sister Elon Alas.

## 2023-04-13 NOTE — Telephone Encounter (Signed)
Spoke with sister Elon Alas)- patient is doing great after kyphoplasty on 04/06/23. She has no pain and is out shopping!  We reviewed that biopsy was negative for metastatic carcinoma.   She can follow up with me prn.

## 2023-04-18 ENCOUNTER — Ambulatory Visit: Admit: 2023-04-18 | Discharge: 2023-04-18 | Payer: MEDICARE

## 2023-04-18 ENCOUNTER — Ambulatory Visit: Admit: 2023-04-18 | Discharge: 2023-04-19 | Payer: MEDICARE

## 2023-04-18 ENCOUNTER — Other Ambulatory Visit: Payer: Self-pay | Admitting: Interventional Radiology

## 2023-04-18 DIAGNOSIS — Z5181 Encounter for therapeutic drug level monitoring: Secondary | ICD-10-CM | POA: Diagnosis not present

## 2023-04-18 DIAGNOSIS — R93422 Abnormal radiologic findings on diagnostic imaging of left kidney: Secondary | ICD-10-CM | POA: Diagnosis not present

## 2023-04-18 DIAGNOSIS — I2510 Atherosclerotic heart disease of native coronary artery without angina pectoris: Secondary | ICD-10-CM | POA: Diagnosis not present

## 2023-04-18 DIAGNOSIS — R9389 Abnormal findings on diagnostic imaging of other specified body structures: Secondary | ICD-10-CM | POA: Diagnosis not present

## 2023-04-18 DIAGNOSIS — I272 Pulmonary hypertension, unspecified: Secondary | ICD-10-CM | POA: Diagnosis not present

## 2023-04-18 DIAGNOSIS — K449 Diaphragmatic hernia without obstruction or gangrene: Secondary | ICD-10-CM | POA: Diagnosis not present

## 2023-04-18 DIAGNOSIS — C7931 Secondary malignant neoplasm of brain: Secondary | ICD-10-CM | POA: Diagnosis not present

## 2023-04-18 DIAGNOSIS — C50811 Malignant neoplasm of overlapping sites of right female breast: Secondary | ICD-10-CM | POA: Diagnosis not present

## 2023-04-18 DIAGNOSIS — C50919 Malignant neoplasm of unspecified site of unspecified female breast: Secondary | ICD-10-CM | POA: Diagnosis not present

## 2023-04-18 DIAGNOSIS — E278 Other specified disorders of adrenal gland: Secondary | ICD-10-CM | POA: Diagnosis not present

## 2023-04-18 DIAGNOSIS — Z79899 Other long term (current) drug therapy: Secondary | ICD-10-CM | POA: Diagnosis not present

## 2023-04-18 DIAGNOSIS — I081 Rheumatic disorders of both mitral and tricuspid valves: Secondary | ICD-10-CM | POA: Diagnosis not present

## 2023-04-18 DIAGNOSIS — M4854XA Collapsed vertebra, not elsewhere classified, thoracic region, initial encounter for fracture: Secondary | ICD-10-CM

## 2023-04-18 DIAGNOSIS — R911 Solitary pulmonary nodule: Secondary | ICD-10-CM | POA: Diagnosis not present

## 2023-04-18 DIAGNOSIS — I340 Nonrheumatic mitral (valve) insufficiency: Secondary | ICD-10-CM | POA: Diagnosis not present

## 2023-04-18 DIAGNOSIS — Z171 Estrogen receptor negative status [ER-]: Secondary | ICD-10-CM | POA: Diagnosis not present

## 2023-04-18 DIAGNOSIS — D151 Benign neoplasm of heart: Secondary | ICD-10-CM | POA: Diagnosis not present

## 2023-04-18 DIAGNOSIS — C7802 Secondary malignant neoplasm of left lung: Secondary | ICD-10-CM | POA: Diagnosis not present

## 2023-04-20 ENCOUNTER — Telehealth: Admit: 2023-04-20 | Discharge: 2023-04-21 | Payer: MEDICARE | Attending: Psychiatry | Primary: Psychiatry

## 2023-04-20 ENCOUNTER — Ambulatory Visit
Admission: RE | Admit: 2023-04-20 | Discharge: 2023-04-20 | Disposition: A | Discharge status: Home / Self Care | Payer: 59 | Source: Ambulatory Visit | Attending: Interventional Radiology | Admitting: Interventional Radiology

## 2023-04-20 ENCOUNTER — Telehealth: Payer: Self-pay

## 2023-04-20 DIAGNOSIS — M4854XA Collapsed vertebra, not elsewhere classified, thoracic region, initial encounter for fracture: Secondary | ICD-10-CM

## 2023-04-20 NOTE — Telephone Encounter (Signed)
Absolutely. [employer]. Will need to come in to get started.

## 2023-04-20 NOTE — Telephone Encounter (Signed)
Received a call from Armstead Peaks, psychiatrist with [facility]. She states that the patient has been seeing them but she feels as if the patient's mood is stable and she does not need to see her anymore. She states that [facility] prescribed the patient's psych meds, except for her Klonopin. She states that the patient has only been taking 0.5 mg every few days. She states that she takes this mainly when she has chemo appointments to help keep her calm. Lorene Dy wanted to reach out to Dr. Laural Benes to see if she would be willing to take over the Klonopin prescription for the patient.   Christie's call back number is [phone].

## 2023-04-21 ENCOUNTER — Institutional Professional Consult (permissible substitution): Admit: 2023-04-21 | Discharge: 2023-04-21 | Payer: MEDICARE

## 2023-04-21 ENCOUNTER — Ambulatory Visit: Admit: 2023-04-21 | Discharge: 2023-04-21 | Payer: MEDICARE

## 2023-04-21 DIAGNOSIS — K219 Gastro-esophageal reflux disease without esophagitis: Principal | ICD-10-CM

## 2023-04-21 DIAGNOSIS — R7989 Other specified abnormal findings of blood chemistry: Principal | ICD-10-CM

## 2023-04-21 DIAGNOSIS — C50919 Malignant neoplasm of unspecified site of unspecified female breast: Principal | ICD-10-CM

## 2023-04-21 DIAGNOSIS — E039 Hypothyroidism, unspecified: Principal | ICD-10-CM

## 2023-04-21 DIAGNOSIS — E876 Hypokalemia: Principal | ICD-10-CM

## 2023-04-21 DIAGNOSIS — C7802 Secondary malignant neoplasm of left lung: Principal | ICD-10-CM

## 2023-04-21 DIAGNOSIS — C50811 Malignant neoplasm of overlapping sites of right female breast: Principal | ICD-10-CM

## 2023-04-21 DIAGNOSIS — K900 Celiac disease: Principal | ICD-10-CM

## 2023-04-21 DIAGNOSIS — Z1159 Encounter for screening for other viral diseases: Principal | ICD-10-CM

## 2023-04-21 DIAGNOSIS — R110 Nausea: Principal | ICD-10-CM

## 2023-04-21 DIAGNOSIS — Z5111 Encounter for antineoplastic chemotherapy: Secondary | ICD-10-CM | POA: Diagnosis not present

## 2023-04-21 DIAGNOSIS — G629 Polyneuropathy, unspecified: Secondary | ICD-10-CM | POA: Diagnosis not present

## 2023-04-21 MED ORDER — FAMOTIDINE 20 MG TABLET
ORAL_TABLET | Freq: Every evening | ORAL | 1 refills | 30 days | Status: CP
Start: 2023-04-21 — End: 2023-06-20

## 2023-04-24 DIAGNOSIS — G620 Drug-induced polyneuropathy: Principal | ICD-10-CM

## 2023-04-24 DIAGNOSIS — T451X5A Adverse effect of antineoplastic and immunosuppressive drugs, initial encounter: Principal | ICD-10-CM

## 2023-04-24 MED ORDER — CLONAZEPAM 0.5 MG TABLET
ORAL_TABLET | 0 refills | 0 days | Status: CP
Start: 2023-04-24 — End: ?

## 2023-04-24 MED ORDER — PREGABALIN 200 MG CAPSULE
ORAL_CAPSULE | Freq: Three times a day (TID) | ORAL | 0 refills | 30 days | Status: CP
Start: 2023-04-24 — End: ?

## 2023-04-25 MED ORDER — CLONAZEPAM 0.5 MG TABLET
ORAL_TABLET | 0 refills | 0 days
Start: 2023-04-25 — End: ?

## 2023-04-27 ENCOUNTER — Ambulatory Visit (INDEPENDENT_AMBULATORY_CARE_PROVIDER_SITE_OTHER): Payer: 59 | Admitting: Family Medicine

## 2023-04-27 ENCOUNTER — Encounter: Payer: Self-pay | Admitting: Family Medicine

## 2023-04-27 VITALS — BP 124/70 | HR 69 | Temp 97.9°F | Ht 62.0 in | Wt 114.2 lb

## 2023-04-27 DIAGNOSIS — F339 Major depressive disorder, recurrent, unspecified: Secondary | ICD-10-CM | POA: Diagnosis not present

## 2023-04-27 DIAGNOSIS — H9201 Otalgia, right ear: Secondary | ICD-10-CM | POA: Diagnosis not present

## 2023-04-27 DIAGNOSIS — F419 Anxiety disorder, unspecified: Secondary | ICD-10-CM

## 2023-04-27 MED ORDER — HYDROCORTISONE-ACETIC ACID 1-2 % OT SOLN
5.0000 [drp] | Freq: Three times a day (TID) | OTIC | 3 refills | Status: DC
Start: 2023-04-27 — End: 2024-10-28

## 2023-04-27 MED ORDER — MAGNESIUM 500 MG PO CAPS: 500.0000 mg | ORAL_CAPSULE | Freq: Every day | ORAL | 3 refills | Status: AC

## 2023-04-27 MED ORDER — CLONAZEPAM 1 MG PO TABS: 1.0000 mg | ORAL_TABLET | Freq: Two times a day (BID) | ORAL | 2 refills | Status: DC

## 2023-04-27 NOTE — Assessment & Plan Note (Signed)
Under good control on current regimen asked by her oncologist if we could take over her klonopin. Continue current regimen. Continue to monitor. Call with any concerns. Refills given for 3 months. Follow up 3 months.

## 2023-04-27 NOTE — Assessment & Plan Note (Signed)
Under good control on current regimen asked by her oncologist if we could take over her klonopin. Continue current regimen. Continue to monitor. Call with any concerns. Refills given for 3 months. Follow up 3 months.   

## 2023-04-27 NOTE — Progress Notes (Signed)
BP 124/70   Pulse 69   Temp 97.9 F (36.6 C) (Oral)   Ht 5\' 2"  (1.575 m)   Wt 114 lb 3.2 oz (51.8 kg)   SpO2 97%   BMI 20.89 kg/m    Subjective:    Patient ID: Barbara Malone, female    DOB: 08-Jul-1956, 67 y.o.   MRN: [id number]  HPI: Barbara Malone is a 67 y.o. female  Chief Complaint  Patient presents with   Anxiety   Ear Problem    Patient says she is having issues with her R ear and says she had a horrible pain behind the ear that lasted for a few seconds.    EAR PAIN Duration: chronic, worse in the past couple of days Involved ear(s): right Severity:  severe  Quality:  sharp Fever: no Otorrhea: no Upper respiratory infection symptoms: no Pruritus: yes Hearing loss: no Water immersion no Using Q-tips: no Recurrent otitis media: no Status: worse Treatments attempted: none  ANXIETY/STRESS Duration: chronic Status:stable Anxious mood: yes  Excessive worrying: yes Irritability: yes  Sweating: no Nausea: no Palpitations:no Hyperventilation: no Panic attacks: no Agoraphobia: no  Obscessions/compulsions: no Depressed mood: yes    04/27/2023    1:24 PM 03/08/2023   10:15 AM  Depression screen PHQ 2/9  Decreased Interest 3 3  Down, Depressed, Hopeless 3 1  PHQ - 2 Score 6 4  Altered sleeping 3 3  Tired, decreased energy 3 2  Change in appetite 3 3  Feeling bad or failure about yourself  3 0  Trouble concentrating 3 0  Moving slowly or fidgety/restless 3 0  Suicidal thoughts 1 0  PHQ-9 Score 25 12  Difficult doing work/chores Extremely dIfficult Somewhat difficult   Anhedonia: no Weight changes: no Insomnia: no   Hypersomnia: no Fatigue/loss of energy: yes Feelings of worthlessness: no Feelings of guilt: no Impaired concentration/indecisiveness: no Suicidal ideations: no  Crying spells: no Recent Stressors/Life Changes: no   Relationship problems: no   Family stress: no     Financial stress: no    Job stress: no    Recent death/loss:  no   Relevant past medical, surgical, family and social history reviewed and updated as indicated. Interim medical history since our last visit reviewed. Allergies and medications reviewed and updated.  Review of Systems  Constitutional: Negative.   HENT:  Positive for ear pain. Negative for congestion, dental problem, drooling, ear discharge, facial swelling, hearing loss, mouth sores, nosebleeds, postnasal drip, rhinorrhea, sinus pressure, sinus pain, sneezing, sore throat, tinnitus, trouble swallowing and voice change.   Respiratory: Negative.    Cardiovascular: Negative.   Gastrointestinal: Negative.   Musculoskeletal: Negative.   Neurological: Negative.   Psychiatric/Behavioral: Negative.      Per HPI unless specifically indicated above     Objective:    BP 124/70   Pulse 69   Temp 97.9 F (36.6 C) (Oral)   Ht 5\' 2"  (1.575 m)   Wt 114 lb 3.2 oz (51.8 kg)   SpO2 97%   BMI 20.89 kg/m   Wt Readings from Last 3 Encounters:  04/27/23 114 lb 3.2 oz (51.8 kg)  03/08/23 115 lb 4.8 oz (52.3 kg)  02/15/23 114 lb 6.4 oz (51.9 kg)    Physical Exam Vitals and nursing note reviewed.  Constitutional:      General: She is not in acute distress.    Appearance: Normal appearance. She is not ill-appearing, toxic-appearing or diaphoretic.  HENT:     Head: Normocephalic and  atraumatic.     Right Ear: Tympanic membrane, ear canal and external ear normal. There is no impacted cerumen.     Left Ear: Tympanic membrane, ear canal and external ear normal. There is no impacted cerumen.     Nose: Nose normal.     Mouth/Throat:     Mouth: Mucous membranes are moist.     Pharynx: Oropharynx is clear.  Eyes:     General: No scleral icterus.       Right eye: No discharge.        Left eye: No discharge.     Extraocular Movements: Extraocular movements intact.     Conjunctiva/sclera: Conjunctivae normal.     Pupils: Pupils are equal, round, and reactive to light.  Cardiovascular:     Rate  and Rhythm: Normal rate and regular rhythm.     Pulses: Normal pulses.     Heart sounds: Normal heart sounds. No murmur heard.    No friction rub. No gallop.  Pulmonary:     Effort: Pulmonary effort is normal. No respiratory distress.     Breath sounds: Normal breath sounds. No stridor. No wheezing, rhonchi or rales.  Chest:     Chest wall: No tenderness.  Musculoskeletal:        General: Normal range of motion.     Cervical back: Normal range of motion and neck supple.  Skin:    General: Skin is warm and dry.     Capillary Refill: Capillary refill takes less than 2 seconds.     Coloration: Skin is not jaundiced or pale.     Findings: No bruising, erythema, lesion or rash.  Neurological:     General: No focal deficit present.     Mental Status: She is alert and oriented to person, place, and time. Mental status is at baseline.  Psychiatric:        Mood and Affect: Mood normal.        Behavior: Behavior normal.        Thought Content: Thought content normal.        Judgment: Judgment normal.     Results for orders placed or performed during the hospital encounter of 04/06/23  Surgical pathology  Result Value Ref Range   SURGICAL PATHOLOGY      SURGICAL PATHOLOGY CASE: [id number] PATIENT: Barbara Malone Surgical Pathology Report     Clinical History: breast cancer with possible T11 pathologic fracture (cm)     FINAL MICROSCOPIC DIAGNOSIS:  A. BONE, T11, BIOPSY:      Bone and bone marrow components.      Negative for metastatic carcinoma.      See comment.  COMMENT:  Immunohistochemical stains for CK AE1/AE3 and GATA 3 were performed. The stains are negative which do not show evidence of metastatic carcinoma.  Controls worked appropriately.  GROSS DESCRIPTION:  The specimen is received in formalin and consists of 2 cores of tan-brown bone, measuring 1.1 and 1.2 cm in length by 0.2 cm in diameter.  The specimen is entirely submitted in 1 cassette  following decalcification with Immunocal.  Lovey Newcomer 04/07/2023)    Final Diagnosis performed by Lance Coon, MD.   Electronically signed 04/11/2023 Technical and / or Professional components performed at [facility]. [facility], [address],  [place], [place] [address].  Immunohistochemistry Technical component (if applicable) was performed at [facility]. [address], [place], [place] [address].   IMMUNOHISTOCHEMISTRY DISCLAIMER (if applicable): Some of these immunohistochemical stains  may have been developed and the performance characteristics determine by [facility]. Some may not have been cleared or approved by the U.S. Food and Drug Administration. The FDA has determined that such clearance or approval is not necessary. This test is used for clinical purposes. It should not be regarded as investigational or for research. This laboratory is certified under the Clinical Laboratory Improvement Amendments of 1988 (CLIA-88) as qualified to perform high complexity clinical laboratory testing.  The controls stained appropriately.       Assessment & Plan:   Problem List Items Addressed This Visit       Other   Anxiety    Under good control on current regimen asked by her oncologist if we could take over her klonopin. Continue current regimen. Continue to monitor. Call with any concerns. Refills given for 3 months. Follow up 3 months.        Depression, recurrent (HCC)    Under good control on current regimen asked by her oncologist if we could take over her klonopin. Continue current regimen. Continue to monitor. Call with any concerns. Refills given for 3 months. Follow up 3 months.       Other Visit Diagnoses     Right ear pain    -  Primary   Dry EAC. Will treat with hydrocortisone. Call with any concerns or if not getting better. She will let her radiation oncologist know about the pain as well.        Follow up  plan: Return in about 3 months (around 07/28/2023) for follow up anxiety.

## 2023-05-03 ENCOUNTER — Telehealth: Admit: 2023-05-03 | Discharge: 2023-05-04 | Payer: MEDICARE

## 2023-05-03 DIAGNOSIS — G620 Drug-induced polyneuropathy: Secondary | ICD-10-CM | POA: Diagnosis not present

## 2023-05-03 DIAGNOSIS — Z515 Encounter for palliative care: Secondary | ICD-10-CM | POA: Diagnosis not present

## 2023-05-03 DIAGNOSIS — T451X5A Adverse effect of antineoplastic and immunosuppressive drugs, initial encounter: Secondary | ICD-10-CM | POA: Diagnosis not present

## 2023-05-03 DIAGNOSIS — C50919 Malignant neoplasm of unspecified site of unspecified female breast: Secondary | ICD-10-CM | POA: Diagnosis not present

## 2023-05-03 MED ORDER — PREGABALIN 200 MG CAPSULE
ORAL_CAPSULE | Freq: Two times a day (BID) | ORAL | 3 refills | 30 days | Status: CP
Start: 2023-05-03 — End: ?

## 2023-05-09 ENCOUNTER — Ambulatory Visit
Admit: 2023-05-09 | Discharge: 2023-05-10 | Payer: MEDICARE | Attending: Radiation Oncology | Primary: Radiation Oncology

## 2023-05-09 ENCOUNTER — Ambulatory Visit: Admit: 2023-05-09 | Discharge: 2023-05-09 | Payer: MEDICARE

## 2023-05-09 DIAGNOSIS — C50811 Malignant neoplasm of overlapping sites of right female breast: Secondary | ICD-10-CM | POA: Diagnosis not present

## 2023-05-09 DIAGNOSIS — C50919 Malignant neoplasm of unspecified site of unspecified female breast: Secondary | ICD-10-CM | POA: Diagnosis not present

## 2023-05-09 DIAGNOSIS — C7931 Secondary malignant neoplasm of brain: Secondary | ICD-10-CM | POA: Diagnosis not present

## 2023-05-09 DIAGNOSIS — Z9889 Other specified postprocedural states: Secondary | ICD-10-CM | POA: Diagnosis not present

## 2023-05-12 ENCOUNTER — Institutional Professional Consult (permissible substitution): Admit: 2023-05-12 | Discharge: 2023-05-13 | Payer: MEDICARE

## 2023-05-12 ENCOUNTER — Ambulatory Visit: Admit: 2023-05-12 | Discharge: 2023-05-13 | Payer: MEDICARE

## 2023-05-12 DIAGNOSIS — C50811 Malignant neoplasm of overlapping sites of right female breast: Principal | ICD-10-CM

## 2023-05-12 DIAGNOSIS — Z79899 Other long term (current) drug therapy: Principal | ICD-10-CM

## 2023-05-12 DIAGNOSIS — C50919 Malignant neoplasm of unspecified site of unspecified female breast: Principal | ICD-10-CM

## 2023-05-12 DIAGNOSIS — R110 Nausea: Principal | ICD-10-CM

## 2023-05-12 DIAGNOSIS — Z5181 Encounter for therapeutic drug level monitoring: Principal | ICD-10-CM

## 2023-05-12 DIAGNOSIS — C7800 Secondary malignant neoplasm of unspecified lung: Secondary | ICD-10-CM | POA: Diagnosis not present

## 2023-05-12 DIAGNOSIS — C7931 Secondary malignant neoplasm of brain: Secondary | ICD-10-CM | POA: Diagnosis not present

## 2023-05-12 DIAGNOSIS — Z923 Personal history of irradiation: Secondary | ICD-10-CM | POA: Diagnosis not present

## 2023-05-12 DIAGNOSIS — G629 Polyneuropathy, unspecified: Secondary | ICD-10-CM | POA: Diagnosis not present

## 2023-05-12 DIAGNOSIS — Z5112 Encounter for antineoplastic immunotherapy: Secondary | ICD-10-CM | POA: Diagnosis not present

## 2023-05-12 DIAGNOSIS — F17210 Nicotine dependence, cigarettes, uncomplicated: Secondary | ICD-10-CM | POA: Diagnosis not present

## 2023-05-26 ENCOUNTER — Ambulatory Visit: Admit: 2023-05-26 | Discharge: 2023-05-27 | Payer: MEDICARE

## 2023-05-26 DIAGNOSIS — Z8673 Personal history of transient ischemic attack (TIA), and cerebral infarction without residual deficits: Principal | ICD-10-CM

## 2023-05-26 DIAGNOSIS — E785 Hyperlipidemia, unspecified: Principal | ICD-10-CM

## 2023-05-26 MED ORDER — FARXIGA 5 MG TABLET
ORAL_TABLET | Freq: Every day | ORAL | 3 refills | 90 days | Status: CP
Start: 2023-05-26 — End: ?

## 2023-05-31 ENCOUNTER — Telehealth: Admit: 2023-05-31 | Discharge: 2023-06-01 | Payer: MEDICARE

## 2023-05-31 DIAGNOSIS — C7802 Secondary malignant neoplasm of left lung: Principal | ICD-10-CM

## 2023-05-31 DIAGNOSIS — G620 Drug-induced polyneuropathy: Principal | ICD-10-CM

## 2023-05-31 DIAGNOSIS — T451X5A Adverse effect of antineoplastic and immunosuppressive drugs, initial encounter: Principal | ICD-10-CM

## 2023-05-31 DIAGNOSIS — Z515 Encounter for palliative care: Principal | ICD-10-CM

## 2023-05-31 DIAGNOSIS — C50919 Malignant neoplasm of unspecified site of unspecified female breast: Principal | ICD-10-CM

## 2023-05-31 DIAGNOSIS — M5431 Sciatica, right side: Secondary | ICD-10-CM | POA: Diagnosis not present

## 2023-06-01 DIAGNOSIS — M7918 Myalgia, other site: Principal | ICD-10-CM

## 2023-06-01 MED ORDER — HYDROCODONE 10 MG-ACETAMINOPHEN 325 MG TABLET
ORAL_TABLET | Freq: Four times a day (QID) | ORAL | 0 refills | 10 days | Status: CP | PRN
Start: 2023-06-01 — End: ?

## 2023-06-02 ENCOUNTER — Ambulatory Visit: Admit: 2023-06-02 | Discharge: 2023-06-02 | Payer: MEDICARE

## 2023-06-02 ENCOUNTER — Institutional Professional Consult (permissible substitution): Admit: 2023-06-02 | Discharge: 2023-06-02 | Payer: MEDICARE

## 2023-06-02 DIAGNOSIS — C50811 Malignant neoplasm of overlapping sites of right female breast: Principal | ICD-10-CM

## 2023-06-02 DIAGNOSIS — M5431 Sciatica, right side: Principal | ICD-10-CM

## 2023-06-02 DIAGNOSIS — C50919 Malignant neoplasm of unspecified site of unspecified female breast: Principal | ICD-10-CM

## 2023-06-02 DIAGNOSIS — R110 Nausea: Principal | ICD-10-CM

## 2023-06-02 DIAGNOSIS — S32511A Fracture of superior rim of right pubis, initial encounter for closed fracture: Secondary | ICD-10-CM | POA: Diagnosis not present

## 2023-06-02 DIAGNOSIS — S329XXA Fracture of unspecified parts of lumbosacral spine and pelvis, initial encounter for closed fracture: Secondary | ICD-10-CM | POA: Diagnosis not present

## 2023-06-02 DIAGNOSIS — M7918 Myalgia, other site: Secondary | ICD-10-CM | POA: Diagnosis not present

## 2023-06-02 DIAGNOSIS — M25551 Pain in right hip: Secondary | ICD-10-CM | POA: Diagnosis not present

## 2023-06-02 DIAGNOSIS — M4317 Spondylolisthesis, lumbosacral region: Secondary | ICD-10-CM | POA: Diagnosis not present

## 2023-06-02 DIAGNOSIS — M5136 Other intervertebral disc degeneration, lumbar region: Secondary | ICD-10-CM | POA: Diagnosis not present

## 2023-06-02 MED ORDER — PREDNISONE 10 MG TABLET
ORAL_TABLET | 0 refills | 0 days | Status: CP
Start: 2023-06-02 — End: ?

## 2023-06-06 ENCOUNTER — Encounter: Payer: Self-pay | Admitting: Family Medicine

## 2023-06-06 ENCOUNTER — Ambulatory Visit (INDEPENDENT_AMBULATORY_CARE_PROVIDER_SITE_OTHER): Payer: 59 | Admitting: Family Medicine

## 2023-06-06 VITALS — BP 121/68 | HR 73 | Temp 98.2°F | Ht 62.0 in | Wt 114.2 lb

## 2023-06-06 DIAGNOSIS — M5431 Sciatica, right side: Secondary | ICD-10-CM

## 2023-06-06 DIAGNOSIS — G500 Trigeminal neuralgia: Secondary | ICD-10-CM

## 2023-06-06 MED ORDER — BUTALBITAL-APAP-CAFFEINE 50-325-40 MG PO TABS: 1.0000 | ORAL_TABLET | Freq: Four times a day (QID) | ORAL | 0 refills | Status: DC | PRN

## 2023-06-06 MED ORDER — CARBAMAZEPINE ER 100 MG PO CP12: 100.0000 mg | ORAL_CAPSULE | Freq: Two times a day (BID) | ORAL | 2 refills | Status: DC

## 2023-06-06 NOTE — Patient Instructions (Signed)
[phone] Reno PT, [address]

## 2023-06-06 NOTE — Progress Notes (Signed)
BP 121/68   Pulse 73   Temp 98.2 F (36.8 C) (Oral)   Ht 5\' 2"  (1.575 m)   Wt 114 lb 3.2 oz (51.8 kg)   SpO2 97%   BMI 20.89 kg/m    Subjective:    Patient ID: Barbara Malone, female    DOB: [birth year], 67 y.o.   MRN: [id number]  HPI: Barbara Malone is a 67 y.o. female  Chief Complaint  Patient presents with   Ear Pain   Ear Problem    Patient says sometimes she feels OK and then some days she feels that she has a feather or hair stuck down in her R ear that is tickling her ear. Patient says she still has some pain in the ear and she is taking her Allergy medication (Flonase and Claritin D) to help.    Medication Management    Patient is requesting a new prescription for FIORICET as she lost the medication and she is not wanting to take the Hydrocodone as it causes her to itch. Patient says her Cardiologist stopped her blood pressure medication completely for the last two weeks.    EAR PAIN Duration: months Involved ear(s): right Severity:  moderate  Quality:  sharp and stabbing and electric Fever: no Otorrhea: no Upper respiratory infection symptoms: no Pruritus: no Hearing loss: no Water immersion no Using Q-tips: no Recurrent otitis media: no Status: better Treatments attempted: steroid ear drops  BACK PAIN Duration: chronic Mechanism of injury: unknown Location: bilateral and low back Onset: gradual Severity: moderate Quality: aching and shooting and sharp Frequency: constant Radiation: down R leg Aggravating factors: movement and laying down Alleviating factors: nothing Status: worse Treatments attempted: rest, ice, heat, APAP, ibuprofen, and aleve  Relief with NSAIDs?: mild Nighttime pain:  no Paresthesias / decreased sensation:  yes Bowel / bladder incontinence:  no Fevers:  no Dysuria / urinary frequency:  no  Relevant past medical, surgical, family and social history reviewed and updated as indicated. Interim medical history since our last visit  reviewed. Allergies and medications reviewed and updated.  Review of Systems  Constitutional: Negative.   HENT:  Positive for ear pain. Negative for congestion, dental problem, drooling, ear discharge, facial swelling, hearing loss, mouth sores, nosebleeds, postnasal drip, rhinorrhea, sinus pressure, sinus pain, sneezing, sore throat, tinnitus, trouble swallowing and voice change.   Respiratory: Negative.    Cardiovascular: Negative.   Gastrointestinal: Negative.   Musculoskeletal: Negative.   Psychiatric/Behavioral: Negative.      Per HPI unless specifically indicated above     Objective:    BP 121/68   Pulse 73   Temp 98.2 F (36.8 C) (Oral)   Ht 5\' 2"  (1.575 m)   Wt 114 lb 3.2 oz (51.8 kg)   SpO2 97%   BMI 20.89 kg/m   Wt Readings from Last 3 Encounters:  06/06/23 114 lb 3.2 oz (51.8 kg)  04/27/23 114 lb 3.2 oz (51.8 kg)  03/08/23 115 lb 4.8 oz (52.3 kg)    Physical Exam Vitals and nursing note reviewed.  Constitutional:      General: She is not in acute distress.    Appearance: Normal appearance. She is normal weight. She is not ill-appearing, toxic-appearing or diaphoretic.  HENT:     Head: Normocephalic and atraumatic.     Right Ear: Tympanic membrane, ear canal and external ear normal.     Left Ear: Tympanic membrane, ear canal and external ear normal.     Nose: Nose normal.  Mouth/Throat:     Mouth: Mucous membranes are moist.     Pharynx: Oropharynx is clear.  Eyes:     General: No scleral icterus.       Right eye: No discharge.        Left eye: No discharge.     Extraocular Movements: Extraocular movements intact.     Conjunctiva/sclera: Conjunctivae normal.     Pupils: Pupils are equal, round, and reactive to light.  Cardiovascular:     Rate and Rhythm: Normal rate and regular rhythm.     Pulses: Normal pulses.     Heart sounds: Normal heart sounds. No murmur heard.    No friction rub. No gallop.  Pulmonary:     Effort: Pulmonary effort is  normal. No respiratory distress.     Breath sounds: Normal breath sounds. No stridor. No wheezing, rhonchi or rales.  Chest:     Chest wall: No tenderness.  Musculoskeletal:        General: Normal range of motion.     Cervical back: Normal range of motion and neck supple.  Skin:    General: Skin is warm and dry.     Capillary Refill: Capillary refill takes less than 2 seconds.     Coloration: Skin is not jaundiced or pale.     Findings: No bruising, erythema, lesion or rash.  Neurological:     General: No focal deficit present.     Mental Status: She is alert and oriented to person, place, and time. Mental status is at baseline.  Psychiatric:        Mood and Affect: Mood normal.        Behavior: Behavior normal.        Thought Content: Thought content normal.        Judgment: Judgment normal.     Results for orders placed or performed during the hospital encounter of 04/06/23  Surgical pathology  Result Value Ref Range   SURGICAL PATHOLOGY      SURGICAL PATHOLOGY CASE: [id number] PATIENT: Barbara Malone Surgical Pathology Report     Clinical History: breast cancer with possible T11 pathologic fracture (cm)     FINAL MICROSCOPIC DIAGNOSIS:  A. BONE, T11, BIOPSY:      Bone and bone marrow components.      Negative for metastatic carcinoma.      See comment.  COMMENT:  Immunohistochemical stains for CK AE1/AE3 and GATA 3 were performed. The stains are negative which do not show evidence of metastatic carcinoma.  Controls worked appropriately.  GROSS DESCRIPTION:  The specimen is received in formalin and consists of 2 cores of tan-brown bone, measuring 1.1 and 1.2 cm in length by 0.2 cm in diameter.  The specimen is entirely submitted in 1 cassette following decalcification with Immunocal.  Lovey Newcomer 04/07/2023)    Final Diagnosis performed by Lance Coon, MD.   Electronically signed 04/11/2023 Technical and / or Professional components performed at [facility]  [facility], [address]  [place], [place] [address].  Immunohistochemistry Technical component (if applicable) was performed at [facility]. [address], [place], [place] [address].   IMMUNOHISTOCHEMISTRY DISCLAIMER (if applicable): Some of these immunohistochemical stains may have been developed and the performance characteristics determine by [facility]. Some may not have been cleared or approved by the U.S. Food and Drug Administration. The FDA has determined that such clearance or approval is not necessary. This test is used for clinical purposes. It should not  be regarded as investigational or for research. This laboratory is certified under the Clinical Laboratory Improvement Amendments of 1988 (CLIA-88) as qualified to perform high complexity clinical laboratory testing.  The controls stained appropriately.       Assessment & Plan:   Problem List Items Addressed This Visit       Nervous and Auditory   Trigeminal neuralgia - Primary    Ear exam normal. Neurology does not think this is from her mets. Pain continues. Will try her on carbamazepine and recheck 2 months. Call with any concerns or if not getting better.       Relevant Medications   carbamazepine (CARBATROL) 100 MG 12 hr capsule   Other Visit Diagnoses     Sciatica, right side       Will get her into PT. Referral generated today. Call with any concerns.   Relevant Medications   carbamazepine (CARBATROL) 100 MG 12 hr capsule   Other Relevant Orders   Ambulatory referral to Physical Therapy        Follow up plan: Return in about 2 months (around 08/06/2023).

## 2023-06-09 DIAGNOSIS — G500 Trigeminal neuralgia: Secondary | ICD-10-CM | POA: Insufficient documentation

## 2023-06-09 MED ORDER — PREDNISONE 10 MG TABLET
ORAL_TABLET | Freq: Every day | ORAL | 0 refills | 3 days | Status: CP
Start: 2023-06-09 — End: 2023-06-12

## 2023-06-09 NOTE — Assessment & Plan Note (Signed)
Ear exam normal. Neurology does not think this is from her mets. Pain continues. Will try her on carbamazepine and recheck 2 months. Call with any concerns or if not getting better.

## 2023-06-13 DIAGNOSIS — M5431 Sciatica, right side: Principal | ICD-10-CM

## 2023-06-13 MED ORDER — HYDROCODONE 10 MG-ACETAMINOPHEN 325 MG TABLET
ORAL_TABLET | Freq: Four times a day (QID) | ORAL | 0 refills | 10 days | PRN
Start: 2023-06-13 — End: ?

## 2023-06-14 DIAGNOSIS — K219 Gastro-esophageal reflux disease without esophagitis: Principal | ICD-10-CM

## 2023-06-14 MED ORDER — FAMOTIDINE 20 MG TABLET
ORAL_TABLET | Freq: Every evening | ORAL | 1 refills | 30 days | Status: CP
Start: 2023-06-14 — End: 2023-08-13

## 2023-06-15 NOTE — Progress Notes (Signed)
Referring Physician:  No referring provider defined for this encounter.  Primary Physician:  Dorcas Carrow, DO  History of Present Illness: 06/20/2023 Ms. 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 She is seeing oncology and palliative care.    Also with history of HTN, hyperlipidemia, drug induced peripheral neuropathy, mitral valve mass, and pulmonary HTN.    Last seen by me for phone visit on 03/13/23- she was sent to IR and had T11 kyphoplasty  on 04/06/23. She was doing very well after this procedure.   She had increased pain in last 3 weeks with constant LBP that radiated into lateral right leg to her ankle. Pain was worse with standing and walking. No left leg pain. She had numbness, tingling, and weakness in right leg. She was using cane to walk. No known injury, but pain started after riding home from the beach  She had 2 rounds of prednisone and her pain is gone.   Bowel/Bladder Dysfunction: none  She smokes 1ppd x 40 years.    Conservative measures:  Physical therapy: none for her back Multimodal medical therapy including regular antiinflammatories: voltaren, voltaren gel, norco 10, lidoderm, lyrica, prednisone Injections: No epidural steroid injections   She had some trigger point injections with short term relief.    Past Surgery:  T11 kyphoplasty on 04/06/23   The symptoms are causing a significant impact on the patient's life.   Review of Systems:  A 10 point review of systems is negative, except for the pertinent positives and negatives detailed in the HPI.  Past Medical History: Past Medical History:  Diagnosis Date   Anxiety    Brain tumor (HCC)    Cancer (HCC)    breast, metastatic, active   Depression    still active; takes meds for depression;    Hyperlipidemia    controllled with medication;    Metastatic breast cancer    Peripheral neuropathy     Past Surgical History: Past Surgical History:  Procedure Laterality  Date   ABDOMINAL HYSTERECTOMY  1984   BILATERAL TOTAL MASTECTOMY WITH AXILLARY LYMPH NODE DISSECTION     BRAIN SURGERY  2019   CARPAL TUNNEL RELEASE     CESAREAN SECTION     IR BONE TUMOR(S)RF ABLATION  04/06/2023   IR KYPHO THORACIC WITH BONE BIOPSY  04/06/2023   IR RADIOLOGIST EVAL & MGMT  03/28/2023   TRIGGER FINGER RELEASE      Allergies: Allergies as of 06/20/2023 - Review Complete 06/20/2023  Allergen Reaction Noted   Buprenorphine-naloxone Other (See Comments) 09/03/2021   Varenicline Other (See Comments) 07/18/2022   Oxycodone Itching 05/11/2017   Tape Dermatitis 05/11/2017   Tetracyclines & related Other (See Comments) 05/11/2017   Vicodin [hydrocodone-acetaminophen] Itching 05/11/2017   Decadron [dexamethasone] Anxiety 03/28/2023    Medications: Outpatient Encounter Medications as of 06/20/2023  Medication Sig   acetic acid-hydrocortisone (VOSOL-HC) OTIC solution Place 5 drops into the right ear 3 (three) times daily.   albuterol (VENTOLIN HFA) 108 (90 Base) MCG/ACT inhaler Inhale 2 puffs into the lungs every 6 (six) hours as needed for wheezing or shortness of breath.   anastrozole (ARIMIDEX) 1 MG tablet Take 1 mg by mouth daily.   butalbital-acetaminophen-caffeine (FIORICET) 50-325-40 MG tablet Take 1 tablet by mouth every 6 (six) hours as needed for headache.   carbamazepine (CARBATROL) 100 MG 12 hr capsule Take 1 capsule (100 mg total) by mouth 2 (two) times daily.   clobetasol ointment (  TEMOVATE) 0.05 % Apply 1 Application topically 2 (two) times daily.   clonazePAM (KLONOPIN) 1 MG tablet Take 1 tablet (1 mg total) by mouth 2 (two) times daily.   dapagliflozin propanediol (FARXIGA) 5 MG TABS tablet Take 5 mg by mouth daily.   diclofenac (VOLTAREN) 50 MG EC tablet Take 50 mg by mouth 2 (two) times daily.   diclofenac Sodium (VOLTAREN) 1 % GEL Apply 2 g topically 4 (four) times daily.   diphenoxylate-atropine (LOMOTIL) 2.5-0.025 MG tablet Take by mouth 4 (four) times  daily as needed for diarrhea or loose stools.   dorzolamide (TRUSOPT) 2 % ophthalmic solution 1 drop 3 (three) times daily.   esomeprazole (NEXIUM) 40 MG capsule Take 40 mg by mouth daily at 12 noon.   famotidine (PEPCID) 20 MG tablet Take 20 mg by mouth daily.   levothyroxine (SYNTHROID) 25 MCG tablet Take 25 mcg by mouth daily.   magic mouthwash (lidocaine, diphenhydrAMINE, alum & mag hydroxide) suspension 5 mLs.   Magnesium 500 MG CAPS Take 1 capsule (500 mg total) by mouth daily.   ondansetron (ZOFRAN) 4 MG tablet Take 4 mg by mouth every 8 (eight) hours as needed for nausea or vomiting.   potassium chloride SA (KLOR-CON M) 20 MEQ tablet Take 1 tablet (20 mEq total) by mouth 4 (four) times daily.   predniSONE (DELTASONE) 10 MG tablet Take by mouth.   pregabalin (LYRICA) 200 MG capsule Take 200 mg by mouth 2 (two) times daily.   prochlorperazine (COMPAZINE) 10 MG tablet Take 10 mg by mouth every 6 (six) hours as needed for nausea or vomiting.   senna (SENOKOT) 8.6 MG TABS tablet Take 1 tablet by mouth.   thiamine 50 MG tablet Take 50 mg by mouth daily.   traZODone (DESYREL) 50 MG tablet Take 50 mg by mouth at bedtime.   venlafaxine XR (EFFEXOR-XR) 150 MG 24 hr capsule Take 150 mg by mouth daily with breakfast.   No facility-administered encounter medications on file as of 06/20/2023.    Social History: Social History   Tobacco Use   Smoking status: Every Day    Packs/day: 1    Types: Cigarettes   Smokeless tobacco: Never  Substance Use Topics   Alcohol use: Yes    Alcohol/week: 20.0 standard drinks of alcohol    Types: 20 Glasses of wine per week   Drug use: No    Family Medical History: Family History  Problem Relation Age of Onset   Prostate cancer Father    Prostate cancer Maternal Grandfather    Cancer Other    Breast cancer Paternal Aunt    Lung cancer Maternal Uncle     Physical Examination: There were no vitals filed for this visit.  General: Patient is well  developed, well nourished, calm, collected, and in no apparent distress. Attention to examination is appropriate.  Respiratory: Patient is breathing without any difficulty.   NEUROLOGICAL:     Awake, alert, oriented to person, place, and time.  Speech is clear and fluent. Fund of knowledge is appropriate.   Cranial Nerves: Pupils equal round and reactive to light.  Facial tone is symmetric.    No posterior lumbar tenderness.   No abnormal lesions on exposed skin.   Strength: Side Biceps Triceps Deltoid Interossei Grip Wrist Ext. Wrist Flex.  R 5 5 5 5 5 5 5   L 5 5 5 5 5 5 5    Side Iliopsoas Quads Hamstring PF DF EHL  R 5 5 5  5  5 5  L 5 5 5 5 5 5    Reflexes are 1+ and symmetric at the biceps, triceps, brachioradialis, patella and achilles.   Hoffman's is absent.  Clonus is not present.   Bilateral upper and lower extremity sensation is intact to light touch.     Gait is normal.     Medical Decision Making  Imaging: MRI lumbar spine dated 02/26/23:  FINDINGS: MRI THORACIC SPINE FINDINGS   Alignment:  Physiologic.   Vertebrae: Subacute T11 vertebral body compression fracture with approximately 10% anterior height loss and marrow edema along the superior half of the vertebral body.   Cord:  Normal signal and morphology.   Paraspinal and other soft tissues: No acute paraspinal abnormality.   Disc levels:   Disc spaces: Mild degenerative disease with disc height loss throughout the thoracic spine.   T1-T2: No disc protrusion, foraminal stenosis or central canal stenosis.   T2-T3: Minimal broad-based disc bulge. No foraminal or central canal stenosis.   T3-T4: Tiny left paracentral disc protrusion. No foraminal or central canal stenosis.   T4-T5: Minimal broad-based disc bulge. No foraminal or central canal stenosis.   T5-T6: Minimal broad-based disc bulge. No foraminal or central canal stenosis.   T6-T7: Minimal broad-based disc bulge. No foraminal or  central canal stenosis.   T7-T8: Broad central/left paracentral shallow disc protrusion. No foraminal or central canal stenosis.   T8-T9: Broad central disc protrusion. No foraminal or central canal stenosis.   T9-T10: Broad-based disc bulge with a tiny central disc protrusion. No foraminal or central canal stenosis.   T10-T11: No disc protrusion, foraminal stenosis or central canal stenosis.   T11-T12: No disc protrusion, foraminal stenosis or central canal stenosis.   MRI LUMBAR SPINE FINDINGS   Segmentation:  Standard.   Alignment:  Minimal grade 1 anterolisthesis of L5 on S1.   Vertebrae: No acute fracture, evidence of discitis, or aggressive bone lesion.   Conus medullaris and cauda equina: Conus extends to the L1 level. Conus and cauda equina appear normal.   Paraspinal and other soft tissues: No acute paraspinal abnormality.   Disc levels:   Disc spaces: Degenerative disease with disc height loss at T12-L1, L1-2, L2-3 and L5-S1.   T12-L1: Mild broad-based disc bulge. No foraminal or central canal stenosis.   L1-L2: Mild broad-based disc bulge. No foraminal or central canal stenosis.   L2-L3: Broad-based disc bulge. Mild bilateral facet arthropathy. Mild spinal stenosis. No foraminal stenosis.   L3-L4: Mild broad-based disc bulge. Mild bilateral lateral recess narrowing. No foraminal or central canal stenosis.   L4-L5: No significant disc bulge. No neural foraminal stenosis. No central canal stenosis.   L5-S1: Broad-based disc bulge with a small central disc protrusion. Moderate-severe left foraminal stenosis. Severe right foraminal stenosis. Mild bilateral facet arthropathy.   IMPRESSION: MR THORACIC SPINE IMPRESSION   1. Subacute T11 vertebral body compression fracture with approximately 10% anterior height loss and marrow edema along the superior half of the vertebral body. 2. Mild thoracic spine spondylosis as described above.   MR LUMBAR  SPINE IMPRESSION   1. Lumbar spine spondylosis as described above. 2. No acute osseous injury of the lumbar spine.     Electronically Signed   By: Elige Ko M.D.   On: 02/27/2023 11:26  I have personally reviewed the images and agree with the above interpretation.  Assessment and Plan: Ms. Corrigan is a pleasant 67 y.o. female  had increased pain in last 3 weeks with constant LBP that radiated  into lateral right leg to her ankle. Pain was worse with standing and walking.   She had 2 rounds of prednisone and her pain is gone. She has no current back or right leg pain.   She has known lumbar spondylosis with broad based disc L5-S1 with severe right and moderate/severe left foraminal stenosis. This is likely her pain generator.   Treatment options discussed with patient and following plan made:   - Discussed PT for lumbar spine. She declines.  - Smoking cessation discussed and encouraged.  - If pain returns, consider referral for lumbar injections.  - She will follow up prn.   I spent a total of 15 minutes in face-to-face and non-face-to-face activities related to this patient's care today including review of outside records, review of imaging, review of symptoms, physical exam, discussion of differential diagnosis, discussion of treatment options, and documentation.   Drake Leach PA-C Dept. of Neurosurgery

## 2023-06-17 ENCOUNTER — Other Ambulatory Visit: Payer: Self-pay | Admitting: Family Medicine

## 2023-06-19 MED ORDER — ALBUTEROL SULFATE HFA 90 MCG/ACTUATION AEROSOL INHALER
Freq: Four times a day (QID) | RESPIRATORY_TRACT | 0 refills | 0 days | PRN
Start: 2023-06-19 — End: ?

## 2023-06-19 NOTE — Telephone Encounter (Signed)
Should have refills on this- has she gone through all of it?

## 2023-06-19 NOTE — Telephone Encounter (Signed)
Requested medication (s) are due for refill today: yes  Requested medication (s) are on the active medication list: yes  Last refill:  04/27/23  Future visit scheduled: yes  Notes to clinic:  Unable to refill per protocol, cannot delegate.      Requested Prescriptions  Pending Prescriptions Disp Refills   clonazePAM (KLONOPIN) 1 MG tablet [Pharmacy Med Name: CLONAZEPAM 1 MG TABLET] 30 tablet 0    Sig: Take 1 tablet (1 mg total) by mouth 2 (two) times daily.     Not Delegated - Psychiatry: Anxiolytics/Hypnotics 2 Failed - 06/17/2023 11:13 AM      Failed - This refill cannot be delegated      Failed - Urine Drug Screen completed in last 360 days      Passed - Patient is not pregnant      Passed - Valid encounter within last 6 months    Recent Outpatient Visits           1 week ago Trigeminal neuralgia   [facility] [facility] [place], Megan P, DO   1 month ago Right ear pain   [place] [facility] [place], Megan P, DO   3 months ago Encounter for annual wellness exam in Medicare patient   [place] [facility] [place], Megan P, DO   4 months ago Cancer of right breast metastatic to brain [facility])   [facility] [facility] Dorcas Carrow, DO       Future Appointments             In 2 days Dorcas Carrow, DO [facility]   In 2 months Laural Benes, Oralia Rud, DO [place] [facility], [facility]

## 2023-06-20 ENCOUNTER — Telehealth: Payer: Self-pay | Admitting: Family Medicine

## 2023-06-20 ENCOUNTER — Encounter: Payer: Self-pay | Admitting: Orthopedic Surgery

## 2023-06-20 ENCOUNTER — Ambulatory Visit (INDEPENDENT_AMBULATORY_CARE_PROVIDER_SITE_OTHER): Payer: 59 | Admitting: Orthopedic Surgery

## 2023-06-20 VITALS — BP 136/70 | Ht 62.0 in | Wt 114.8 lb

## 2023-06-20 DIAGNOSIS — S22080D Wedge compression fracture of T11-T12 vertebra, subsequent encounter for fracture with routine healing: Secondary | ICD-10-CM | POA: Diagnosis not present

## 2023-06-20 DIAGNOSIS — M4726 Other spondylosis with radiculopathy, lumbar region: Secondary | ICD-10-CM | POA: Diagnosis not present

## 2023-06-20 DIAGNOSIS — M5416 Radiculopathy, lumbar region: Secondary | ICD-10-CM

## 2023-06-20 DIAGNOSIS — M47816 Spondylosis without myelopathy or radiculopathy, lumbar region: Secondary | ICD-10-CM

## 2023-06-20 NOTE — Telephone Encounter (Signed)
Can we check on this please? 

## 2023-06-20 NOTE — Telephone Encounter (Signed)
Patient came into office requesting refill on her Albuterol Inhaler to be sent to [facility]. Informed patient I would send message to her provider Dr. Laural Benes. Ask patient to allow 48-72 hours for provider to respond. Patient request phone call upon completion. [phone]

## 2023-06-21 ENCOUNTER — Ambulatory Visit (INDEPENDENT_AMBULATORY_CARE_PROVIDER_SITE_OTHER): Payer: 59 | Admitting: Family Medicine

## 2023-06-21 ENCOUNTER — Encounter: Payer: Self-pay | Admitting: Family Medicine

## 2023-06-21 VITALS — BP 134/76 | HR 69 | Temp 98.0°F | Wt 113.0 lb

## 2023-06-21 DIAGNOSIS — G500 Trigeminal neuralgia: Secondary | ICD-10-CM | POA: Diagnosis not present

## 2023-06-21 MED ORDER — ALBUTEROL SULFATE HFA 108 (90 BASE) MCG/ACT IN AERS: 2.0000 | INHALATION_SPRAY | Freq: Four times a day (QID) | RESPIRATORY_TRACT | 4 refills | Status: DC | PRN

## 2023-06-21 NOTE — Telephone Encounter (Signed)
Pt came in for appt. Today refill pended on today's encounter.

## 2023-06-21 NOTE — Progress Notes (Signed)
BP 134/76   Pulse 69   Temp 98 F (36.7 C) (Oral)   Wt 113 lb (51.3 kg)   SpO2 99%   BMI 20.67 kg/m    Subjective:    Patient ID: Barbara Malone, female    DOB: [birth year], 67 y.o.   MRN: [id number]  HPI: Barbara Malone is a 67 y.o. female  Chief Complaint  Patient presents with   Trigeminal Neuralgia   TRIGEMINAL NEURALGIA Duration: months Onset: sudden Severity: moderate Quality: tickling, burning Frequency: a few times a week Location: side of her face Headache duration: 5-10 minutes Radiation: no Headache status at time of visit: asymptomatic Treatments attempted: Treatments attempted: carbamezapine   Aura: no Nausea:  no Vomiting: no Photophobia:  no Phonophobia:  no Effect on social functioning:  yes Confusion:  no Gait disturbance/ataxia:  no Behavioral changes:  no Fevers:  no  Relevant past medical, surgical, family and social history reviewed and updated as indicated. Interim medical history since our last visit reviewed. Allergies and medications reviewed and updated.  Review of Systems  Constitutional: Negative.   Respiratory: Negative.    Cardiovascular: Negative.   Gastrointestinal: Negative.   Musculoskeletal: Negative.   Neurological:  Positive for headaches. Negative for dizziness, tremors, seizures, syncope, facial asymmetry, speech difficulty, weakness, light-headedness and numbness.  Psychiatric/Behavioral: Negative.      Per HPI unless specifically indicated above     Objective:    BP 134/76   Pulse 69   Temp 98 F (36.7 C) (Oral)   Wt 113 lb (51.3 kg)   SpO2 99%   BMI 20.67 kg/m   Wt Readings from Last 3 Encounters:  06/21/23 113 lb (51.3 kg)  06/20/23 114 lb 12.8 oz (52.1 kg)  06/06/23 114 lb 3.2 oz (51.8 kg)    Physical Exam Vitals and nursing note reviewed.  Constitutional:      General: She is not in acute distress.    Appearance: Normal appearance. She is not ill-appearing, toxic-appearing or diaphoretic.  HENT:      Head: Normocephalic and atraumatic.     Right Ear: External ear normal.     Left Ear: External ear normal.     Nose: Nose normal.     Mouth/Throat:     Mouth: Mucous membranes are moist.     Pharynx: Oropharynx is clear.  Eyes:     General: No scleral icterus.       Right eye: No discharge.        Left eye: No discharge.     Extraocular Movements: Extraocular movements intact.     Conjunctiva/sclera: Conjunctivae normal.     Pupils: Pupils are equal, round, and reactive to light.  Cardiovascular:     Rate and Rhythm: Normal rate and regular rhythm.     Pulses: Normal pulses.     Heart sounds: Normal heart sounds. No murmur heard.    No friction rub. No gallop.  Pulmonary:     Effort: Pulmonary effort is normal. No respiratory distress.     Breath sounds: Normal breath sounds. No stridor. No wheezing, rhonchi or rales.  Chest:     Chest wall: No tenderness.  Musculoskeletal:        General: Normal range of motion.     Cervical back: Normal range of motion and neck supple.  Skin:    General: Skin is warm and dry.     Capillary Refill: Capillary refill takes less than 2 seconds.     Coloration: Skin is  not jaundiced or pale.     Findings: No bruising, erythema, lesion or rash.  Neurological:     General: No focal deficit present.     Mental Status: She is alert and oriented to person, place, and time. Mental status is at baseline.  Psychiatric:        Mood and Affect: Mood normal.        Behavior: Behavior normal.        Thought Content: Thought content normal.        Judgment: Judgment normal.     Results for orders placed or performed during the hospital encounter of 04/06/23  Surgical pathology  Result Value Ref Range   SURGICAL PATHOLOGY      SURGICAL PATHOLOGY CASE: [id number] PATIENT: Barbara Malone Surgical Pathology Report     Clinical History: breast cancer with possible T11 pathologic fracture (cm)     FINAL MICROSCOPIC DIAGNOSIS:  A. BONE, T11,  BIOPSY:      Bone and bone marrow components.      Negative for metastatic carcinoma.      See comment.  COMMENT:  Immunohistochemical stains for CK AE1/AE3 and GATA 3 were performed. The stains are negative which do not show evidence of metastatic carcinoma.  Controls worked appropriately.  GROSS DESCRIPTION:  The specimen is received in formalin and consists of 2 cores of tan-brown bone, measuring 1.1 and 1.2 cm in length by 0.2 cm in diameter.  The specimen is entirely submitted in 1 cassette following decalcification with Immunocal.  Lovey Newcomer 04/07/2023)    Final Diagnosis performed by Lance Coon, MD.   Electronically signed 04/11/2023 Technical and / or Professional components performed at [facility]. [facility], [address],  [place], [place] [address].  Immunohistochemistry Technical component (if applicable) was performed at [facility]. [address], [address], [place], [place] [address].   IMMUNOHISTOCHEMISTRY DISCLAIMER (if applicable): Some of these immunohistochemical stains may have been developed and the performance characteristics determine by [facility]. Some may not have been cleared or approved by the U.S. Food and Drug Administration. The FDA has determined that such clearance or approval is not necessary. This test is used for clinical purposes. It should not be regarded as investigational or for research. This laboratory is certified under the Clinical Laboratory Improvement Amendments of 1988 (CLIA-88) as qualified to perform high complexity clinical laboratory testing.  The controls stained appropriately.       Assessment & Plan:   Problem List Items Addressed This Visit       Nervous and Auditory   Trigeminal neuralgia - Primary    Significantly better on carbamazepine. Pain is happening a couple of times a week now. Continue current regimen. Recheck labs next visit. Call with any concerns.         Follow  up plan: Return in about 5 weeks (around 07/28/2023) for OK to cancel appt in September.

## 2023-06-21 NOTE — Telephone Encounter (Signed)
Pt came in office today. States medication is not needed.  Please refuse.

## 2023-06-21 NOTE — Assessment & Plan Note (Signed)
Significantly better on carbamazepine. Pain is happening a couple of times a week now. Continue current regimen. Recheck labs next visit. Call with any concerns.

## 2023-06-23 ENCOUNTER — Ambulatory Visit: Admit: 2023-06-23 | Discharge: 2023-06-23 | Payer: MEDICARE

## 2023-06-23 ENCOUNTER — Institutional Professional Consult (permissible substitution): Admit: 2023-06-23 | Discharge: 2023-06-23 | Payer: MEDICARE

## 2023-06-23 DIAGNOSIS — C50919 Malignant neoplasm of unspecified site of unspecified female breast: Principal | ICD-10-CM

## 2023-06-23 DIAGNOSIS — R1319 Other dysphagia: Principal | ICD-10-CM

## 2023-06-23 DIAGNOSIS — C7802 Secondary malignant neoplasm of left lung: Principal | ICD-10-CM

## 2023-06-23 DIAGNOSIS — C50811 Malignant neoplasm of overlapping sites of right female breast: Principal | ICD-10-CM

## 2023-06-23 DIAGNOSIS — R110 Nausea: Principal | ICD-10-CM

## 2023-06-23 DIAGNOSIS — Z5112 Encounter for antineoplastic immunotherapy: Secondary | ICD-10-CM | POA: Diagnosis not present

## 2023-06-23 DIAGNOSIS — G629 Polyneuropathy, unspecified: Secondary | ICD-10-CM | POA: Diagnosis not present

## 2023-06-23 DIAGNOSIS — Z171 Estrogen receptor negative status [ER-]: Secondary | ICD-10-CM | POA: Diagnosis not present

## 2023-06-23 DIAGNOSIS — E039 Hypothyroidism, unspecified: Secondary | ICD-10-CM | POA: Diagnosis not present

## 2023-06-23 DIAGNOSIS — E876 Hypokalemia: Secondary | ICD-10-CM | POA: Diagnosis not present

## 2023-06-23 DIAGNOSIS — C799 Secondary malignant neoplasm of unspecified site: Secondary | ICD-10-CM | POA: Diagnosis not present

## 2023-06-23 DIAGNOSIS — F17210 Nicotine dependence, cigarettes, uncomplicated: Secondary | ICD-10-CM | POA: Diagnosis not present

## 2023-06-23 DIAGNOSIS — M8580 Other specified disorders of bone density and structure, unspecified site: Secondary | ICD-10-CM | POA: Diagnosis not present

## 2023-06-27 ENCOUNTER — Telehealth: Admit: 2023-06-27 | Discharge: 2023-06-28 | Payer: MEDICARE

## 2023-06-27 DIAGNOSIS — R110 Nausea: Secondary | ICD-10-CM | POA: Diagnosis not present

## 2023-06-27 DIAGNOSIS — C50919 Malignant neoplasm of unspecified site of unspecified female breast: Secondary | ICD-10-CM | POA: Diagnosis not present

## 2023-06-27 DIAGNOSIS — Z515 Encounter for palliative care: Secondary | ICD-10-CM | POA: Diagnosis not present

## 2023-06-28 MED ORDER — ONDANSETRON HCL 8 MG TABLET
ORAL_TABLET | Freq: Three times a day (TID) | ORAL | 1 refills | 20 days | Status: CP | PRN
Start: 2023-06-28 — End: ?

## 2023-07-14 ENCOUNTER — Institutional Professional Consult (permissible substitution): Payer: MEDICARE

## 2023-07-14 ENCOUNTER — Ambulatory Visit: Payer: MEDICARE

## 2023-07-14 ENCOUNTER — Ambulatory Visit: Admit: 2023-07-14 | Discharge: 2023-07-14 | Payer: MEDICARE

## 2023-07-14 DIAGNOSIS — R110 Nausea: Principal | ICD-10-CM

## 2023-07-14 DIAGNOSIS — C50811 Malignant neoplasm of overlapping sites of right female breast: Principal | ICD-10-CM

## 2023-07-14 DIAGNOSIS — R630 Anorexia: Principal | ICD-10-CM

## 2023-07-14 DIAGNOSIS — C50919 Malignant neoplasm of unspecified site of unspecified female breast: Principal | ICD-10-CM

## 2023-07-14 DIAGNOSIS — I340 Nonrheumatic mitral (valve) insufficiency: Secondary | ICD-10-CM | POA: Diagnosis not present

## 2023-07-14 DIAGNOSIS — I071 Rheumatic tricuspid insufficiency: Secondary | ICD-10-CM | POA: Diagnosis not present

## 2023-07-14 DIAGNOSIS — Z79899 Other long term (current) drug therapy: Secondary | ICD-10-CM | POA: Diagnosis not present

## 2023-07-14 DIAGNOSIS — Z5111 Encounter for antineoplastic chemotherapy: Secondary | ICD-10-CM | POA: Diagnosis not present

## 2023-07-14 DIAGNOSIS — I272 Pulmonary hypertension, unspecified: Secondary | ICD-10-CM | POA: Diagnosis not present

## 2023-07-14 DIAGNOSIS — Z5181 Encounter for therapeutic drug level monitoring: Secondary | ICD-10-CM | POA: Diagnosis not present

## 2023-07-21 ENCOUNTER — Other Ambulatory Visit: Payer: Self-pay | Admitting: Family Medicine

## 2023-07-21 NOTE — Telephone Encounter (Signed)
Requested medication (s) are due for refill today -yes  Requested medication (s) are on the active medication list -yes  Future visit scheduled -yes  Last refill: 04/27/23 #30 2RF  Notes to clinic: non delegated Rx  Requested Prescriptions  Pending Prescriptions Disp Refills   clonazePAM (KLONOPIN) 1 MG tablet 30 tablet 2    Sig: Take 1 tablet (1 mg total) by mouth 2 (two) times daily.     Not Delegated - Psychiatry: Anxiolytics/Hypnotics 2 Failed - 07/21/2023  2:24 PM      Failed - This refill cannot be delegated      Failed - Urine Drug Screen completed in last 360 days      Passed - Patient is not pregnant      Passed - Valid encounter within last 6 months    Recent Outpatient Visits           1 month ago Trigeminal neuralgia   [place] [facility] [place], Megan P, DO   1 month ago Trigeminal neuralgia   [place] [facility] [place], [place] P, DO   2 months ago Right ear pain   [place] [facility] [place], Megan P, DO   4 months ago Encounter for annual wellness exam in Medicare patient   [place] [facility] [place], Megan P, DO   5 months ago Cancer of right breast metastatic to brain [facility])   [place] [facility] [place], Oralia Rud, DO       Future Appointments             In 1 week Laural Benes, Oralia Rud, DO [place] [facility], [facility]               Requested Prescriptions  Pending Prescriptions Disp Refills   clonazePAM (KLONOPIN) 1 MG tablet 30 tablet 2    Sig: Take 1 tablet (1 mg total) by mouth 2 (two) times daily.     Not Delegated - Psychiatry: Anxiolytics/Hypnotics 2 Failed - 07/21/2023  2:24 PM      Failed - This refill cannot be delegated      Failed - Urine Drug Screen completed in last 360 days      Passed - Patient is not pregnant      Passed - Valid encounter within last 6 months    Recent Outpatient Visits           1 month ago Trigeminal  neuralgia   [place] [facility] [place], Megan P, DO   1 month ago Trigeminal neuralgia   [place] [facility] [place], Megan P, DO   2 months ago Right ear pain   [place] [facility] [place], Megan P, DO   4 months ago Encounter for annual wellness exam in Medicare patient   [place] [facility] [place], Megan P, DO   5 months ago Cancer of right breast metastatic to brain [facility])   [facility] Dorcas Carrow, DO       Future Appointments             In 1 week Laural Benes, Oralia Rud, DO  [facility], [facility]

## 2023-07-21 NOTE — Telephone Encounter (Signed)
Medication Refill - Medication:  clonazePAM (KLONOPIN) 1 MG tablet   Has the patient contacted their pharmacy? No.  Preferred Pharmacy (with phone number or street name):  [facility] [place], [place] - [address] Phone: [phone]  Fax: [phone]     Has the patient been seen for an appointment in the last year OR does the patient have an upcoming appointment? Yes.    Agent: Please be advised that RX refills may take up to 3 business days. We ask that you follow-up with your pharmacy.

## 2023-07-24 ENCOUNTER — Other Ambulatory Visit: Payer: Self-pay | Admitting: Family Medicine

## 2023-07-24 NOTE — Telephone Encounter (Signed)
Does she have enough to make it to her appt?

## 2023-07-24 NOTE — Telephone Encounter (Signed)
Requested medication (s) are due for refill today - yes  Requested medication (s) are on the active medication list -yes  Future visit scheduled -yes  Last refill: 04/27/23 #30 2RF  Notes to clinic: non delegated Rx  Requested Prescriptions  Pending Prescriptions Disp Refills   clonazePAM (KLONOPIN) 1 MG tablet [Pharmacy Med Name: CLONAZEPAM 1 MG TABLET] 30 tablet 0    Sig: Take 1 tablet (1 mg total) by mouth 2 (two) times daily.     Not Delegated - Psychiatry: Anxiolytics/Hypnotics 2 Failed - 07/24/2023 11:00 AM      Failed - This refill cannot be delegated      Failed - Urine Drug Screen completed in last 360 days      Passed - Patient is not pregnant      Passed - Valid encounter within last 6 months    Recent Outpatient Visits           1 month ago Trigeminal neuralgia   [place] [facility] [place], Megan P, DO   1 month ago Trigeminal neuralgia   [place] [facility] [place], Megan P, DO   2 months ago Right ear pain   [place] [facility] [place], Megan P, DO   4 months ago Encounter for annual wellness exam in Medicare patient   [place] [facility] [place], Megan P, DO   5 months ago Cancer of right breast metastatic to brain [facility])   [place] [facility] [place], [place], DO       Future Appointments             In 1 week Laural Benes, Oralia Rud, DO [place] [facility], [facility]               Requested Prescriptions  Pending Prescriptions Disp Refills   clonazePAM (KLONOPIN) 1 MG tablet [Pharmacy Med Name: CLONAZEPAM 1 MG TABLET] 30 tablet 0    Sig: Take 1 tablet (1 mg total) by mouth 2 (two) times daily.     Not Delegated - Psychiatry: Anxiolytics/Hypnotics 2 Failed - 07/24/2023 11:00 AM      Failed - This refill cannot be delegated      Failed - Urine Drug Screen completed in last 360 days      Passed - Patient is not pregnant      Passed - Valid encounter  within last 6 months    Recent Outpatient Visits           1 month ago Trigeminal neuralgia   [place] [facility] [place], Megan P, DO   1 month ago Trigeminal neuralgia   [place] [facility] [place], Megan P, DO   2 months ago Right ear pain   [place] [facility] [place], Megan P, DO   4 months ago Encounter for annual wellness exam in Medicare patient   [place] [facility] [place], Megan P, DO   5 months ago Cancer of right breast metastatic to brain [facility])   [place] [facility] Dorcas Carrow, DO       Future Appointments             In 1 week Laural Benes, Oralia Rud, DO [place] [facility], [facility]

## 2023-07-31 ENCOUNTER — Encounter: Payer: Self-pay | Admitting: Family Medicine

## 2023-07-31 ENCOUNTER — Ambulatory Visit (INDEPENDENT_AMBULATORY_CARE_PROVIDER_SITE_OTHER): Payer: 59 | Admitting: Family Medicine

## 2023-07-31 VITALS — BP 125/74 | HR 71 | Temp 98.3°F | Wt 113.2 lb

## 2023-07-31 DIAGNOSIS — Z23 Encounter for immunization: Secondary | ICD-10-CM | POA: Diagnosis not present

## 2023-07-31 DIAGNOSIS — C7931 Secondary malignant neoplasm of brain: Secondary | ICD-10-CM | POA: Diagnosis not present

## 2023-07-31 DIAGNOSIS — C7802 Secondary malignant neoplasm of left lung: Secondary | ICD-10-CM

## 2023-07-31 DIAGNOSIS — E782 Mixed hyperlipidemia: Secondary | ICD-10-CM | POA: Diagnosis not present

## 2023-07-31 DIAGNOSIS — F419 Anxiety disorder, unspecified: Secondary | ICD-10-CM

## 2023-07-31 DIAGNOSIS — C50911 Malignant neoplasm of unspecified site of right female breast: Secondary | ICD-10-CM

## 2023-07-31 DIAGNOSIS — F339 Major depressive disorder, recurrent, unspecified: Secondary | ICD-10-CM

## 2023-07-31 DIAGNOSIS — Z114 Encounter for screening for human immunodeficiency virus [HIV]: Secondary | ICD-10-CM

## 2023-07-31 DIAGNOSIS — C7801 Secondary malignant neoplasm of right lung: Secondary | ICD-10-CM

## 2023-07-31 DIAGNOSIS — E039 Hypothyroidism, unspecified: Secondary | ICD-10-CM | POA: Diagnosis not present

## 2023-07-31 DIAGNOSIS — I10 Essential (primary) hypertension: Secondary | ICD-10-CM

## 2023-07-31 MED ORDER — BUTALBITAL-APAP-CAFFEINE 50-325-40 MG PO TABS: 1.0000 | ORAL_TABLET | Freq: Four times a day (QID) | ORAL | 0 refills | Status: DC | PRN

## 2023-07-31 MED ORDER — CLONAZEPAM 1 MG PO TABS: 1.0000 mg | ORAL_TABLET | Freq: Two times a day (BID) | ORAL | 3 refills | Status: DC

## 2023-07-31 MED ORDER — ALBUTEROL SULFATE HFA 108 (90 BASE) MCG/ACT IN AERS: 2.0000 | INHALATION_SPRAY | Freq: Four times a day (QID) | RESPIRATORY_TRACT | 4 refills | Status: DC | PRN

## 2023-07-31 MED ORDER — CARBAMAZEPINE ER 100 MG PO CP12: 100.0000 mg | ORAL_CAPSULE | Freq: Two times a day (BID) | ORAL | 1 refills | Status: DC

## 2023-07-31 NOTE — Assessment & Plan Note (Signed)
Stable. Continue to follow with oncology. Call with any concerns. Continue to monitor.  

## 2023-07-31 NOTE — Progress Notes (Signed)
BP 125/74   Pulse 71   Temp 98.3 F (36.8 C) (Oral)   Wt 113 lb 3.2 oz (51.3 kg)   SpO2 97%   BMI 20.70 kg/m    Subjective:    Patient ID: Barbara Malone, female    DOB: 04/01/1956, 67 y.o.   MRN: [id number]  HPI: Barbara Malone is a 67 y.o. female  Chief Complaint  Patient presents with   Hypertension   Hyperlipidemia   ANXIETY/STRESS- exacerbated. Thinks her upstairs neighbor is coming into her house and stealing her jewelry for drug money Duration: chronic Status:exacerbated Anxious mood: yes  Excessive worrying: yes Irritability: yes  Sweating: no Nausea: no Palpitations:no Hyperventilation: no Panic attacks: yes Agoraphobia: no  Obscessions/compulsions: no Depressed mood: no    07/31/2023    1:13 PM 06/06/2023    9:34 AM 04/27/2023    1:24 PM 03/08/2023   10:15 AM  Depression screen PHQ 2/9  Decreased Interest 2 3 3 3   Down, Depressed, Hopeless 2 2 3 1   PHQ - 2 Score 4 5 6 4   Altered sleeping 1 2 3 3   Tired, decreased energy 1 3 3 2   Change in appetite 3 3 3 3   Feeling bad or failure about yourself  1 3 3  0  Trouble concentrating 3 3 3  0  Moving slowly or fidgety/restless 1 3 3  0  Suicidal thoughts 0 1 1 0  PHQ-9 Score 14 23 25 12   Difficult doing work/chores Somewhat difficult  Extremely dIfficult Somewhat difficult   Anhedonia: no Weight changes: no Insomnia: no   Hypersomnia: no Fatigue/loss of energy: no Feelings of worthlessness: no Feelings of guilt: no Impaired concentration/indecisiveness: no Suicidal ideations: no  Crying spells: no Recent Stressors/Life Changes: yes   Relationship problems: no   Family stress: no     Financial stress: yes    Job stress: no    Recent death/loss: no  HYPERTENSION / HYPERLIPIDEMIA Satisfied with current treatment? yes Duration of hypertension: chronic BP monitoring frequency: not checking BP medication side effects: no BP meds: none currently Duration of hyperlipidemia: chronic Cholesterol medication side  effects: no Cholesterol supplements: none Past cholesterol medications: none Medication compliance: Excellent Aspirin: no Recent stressors: yes Recurrent headaches: no Visual changes: no Palpitations: no Dyspnea: no Chest pain: no Lower extremity edema: no Dizzy/lightheaded: no  HYPOTHYROIDISM Thyroid control status:stable Satisfied with current treatment? yes Medication side effects: no Medication compliance: excellent compliance Recent dose adjustment:no Fatigue: no Cold intolerance: no Heat intolerance: no Weight gain: no Weight loss: no Constipation: no Diarrhea/loose stools: no Palpitations: no Lower extremity edema: no Anxiety/depressed mood: yes   Relevant past medical, surgical, family and social history reviewed and updated as indicated. Interim medical history since our last visit reviewed. Allergies and medications reviewed and updated.  Review of Systems  Constitutional: Negative.   Respiratory: Negative.    Cardiovascular: Negative.   Gastrointestinal: Negative.   Musculoskeletal: Negative.   Neurological: Negative.   Psychiatric/Behavioral: Negative.      Per HPI unless specifically indicated above     Objective:    BP 125/74   Pulse 71   Temp 98.3 F (36.8 C) (Oral)   Wt 113 lb 3.2 oz (51.3 kg)   SpO2 97%   BMI 20.70 kg/m   Wt Readings from Last 3 Encounters:  07/31/23 113 lb 3.2 oz (51.3 kg)  06/21/23 113 lb (51.3 kg)  06/20/23 114 lb 12.8 oz (52.1 kg)    Physical Exam Vitals and nursing note reviewed.  Constitutional:      General: She is not in acute distress.    Appearance: Normal appearance. She is not ill-appearing, toxic-appearing or diaphoretic.  HENT:     Head: Normocephalic and atraumatic.     Right Ear: External ear normal.     Left Ear: External ear normal.     Nose: Nose normal.     Mouth/Throat:     Mouth: Mucous membranes are moist.     Pharynx: Oropharynx is clear.  Eyes:     General: No scleral icterus.        Right eye: No discharge.        Left eye: No discharge.     Extraocular Movements: Extraocular movements intact.     Conjunctiva/sclera: Conjunctivae normal.     Pupils: Pupils are equal, round, and reactive to light.  Cardiovascular:     Rate and Rhythm: Normal rate and regular rhythm.     Pulses: Normal pulses.     Heart sounds: Normal heart sounds. No murmur heard.    No friction rub. No gallop.  Pulmonary:     Effort: Pulmonary effort is normal. No respiratory distress.     Breath sounds: Normal breath sounds. No stridor. No wheezing, rhonchi or rales.  Chest:     Chest wall: No tenderness.  Musculoskeletal:        General: Normal range of motion.     Cervical back: Normal range of motion and neck supple.  Skin:    General: Skin is warm and dry.     Capillary Refill: Capillary refill takes less than 2 seconds.     Coloration: Skin is not jaundiced or pale.     Findings: No bruising, erythema, lesion or rash.  Neurological:     General: No focal deficit present.     Mental Status: She is alert and oriented to person, place, and time. Mental status is at baseline.  Psychiatric:        Mood and Affect: Mood normal.        Behavior: Behavior normal.        Thought Content: Thought content normal.        Judgment: Judgment normal.     Results for orders placed or performed in visit on 07/31/23  HM HEPATITIS C SCREENING LAB  Result Value Ref Range   HM Hepatitis Screen Negative-Validated       Assessment & Plan:   Problem List Items Addressed This Visit       Cardiovascular and Mediastinum   Hypertension - Primary    Under good control on current regimen. Continue current regimen. Continue to monitor. Call with any concerns. Refills given. Labs drawn today.        Relevant Orders   Comprehensive metabolic panel     Respiratory   Malignant neoplasm metastatic to both lungs (HCC)    Stable. Continue to follow with oncology. Call with any concerns. Continue to  monitor.         Endocrine   Acquired hypothyroidism    Rechecking labs today. Await results. Treat as needed.       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 Continue to follow with oncology. Call with any concerns. Continue to monitor.         Other   Anxiety    Under good control on current regimen. Continue current regimen. Continue  to monitor. Call with any concerns. Refills given for 4 months. Follow up 4 months.        Relevant Orders   CBC with Differential/Platelet   Comprehensive metabolic panel   Depression, recurrent (HCC)    Under good control on current regimen. Continue current regimen. Continue to monitor. Call with any concerns. Refills given for 4 months. Follow up 4 months.       Mixed hyperlipidemia    Under good control on current regimen. Continue current regimen. Continue to monitor. Call with any concerns. Refills given. Labs drawn today.        Relevant Orders   Lipid Panel w/o Chol/HDL Ratio   Comprehensive metabolic panel   Other Visit Diagnoses     Screening for HIV (human immunodeficiency virus)       Labs to be drawn. Await results.   Relevant Orders   HIV Antibody (routine testing w rflx)   Need for vaccination against Streptococcus pneumoniae       Prevnar given today.   Relevant Orders   Pneumococcal conjugate vaccine 20-valent (Prevnar 20) (Completed)        Follow up plan: Return in about 4 months (around 11/30/2023).

## 2023-07-31 NOTE — Assessment & Plan Note (Signed)
Under good control on current regimen. Continue current regimen. Continue to monitor. Call with any concerns. Refills given. Labs drawn today.   

## 2023-07-31 NOTE — Assessment & Plan Note (Signed)
Rechecking labs today. Await results. Treat as needed.  °

## 2023-07-31 NOTE — Assessment & Plan Note (Signed)
Under good control on current regimen. Continue current regimen. Continue to monitor. Call with any concerns. Refills given for 4 months. Follow up 4 months.   

## 2023-08-01 ENCOUNTER — Ambulatory Visit: Admit: 2023-08-01 | Discharge: 2023-08-02 | Payer: MEDICARE

## 2023-08-01 DIAGNOSIS — Z5181 Encounter for therapeutic drug level monitoring: Secondary | ICD-10-CM | POA: Diagnosis not present

## 2023-08-01 DIAGNOSIS — J811 Chronic pulmonary edema: Secondary | ICD-10-CM | POA: Diagnosis not present

## 2023-08-01 DIAGNOSIS — C50919 Malignant neoplasm of unspecified site of unspecified female breast: Secondary | ICD-10-CM | POA: Diagnosis not present

## 2023-08-01 DIAGNOSIS — C7800 Secondary malignant neoplasm of unspecified lung: Secondary | ICD-10-CM | POA: Diagnosis not present

## 2023-08-01 DIAGNOSIS — C50811 Malignant neoplasm of overlapping sites of right female breast: Secondary | ICD-10-CM | POA: Diagnosis not present

## 2023-08-01 DIAGNOSIS — Z171 Estrogen receptor negative status [ER-]: Secondary | ICD-10-CM | POA: Diagnosis not present

## 2023-08-01 DIAGNOSIS — I081 Rheumatic disorders of both mitral and tricuspid valves: Secondary | ICD-10-CM | POA: Diagnosis not present

## 2023-08-01 DIAGNOSIS — I272 Pulmonary hypertension, unspecified: Secondary | ICD-10-CM | POA: Diagnosis not present

## 2023-08-01 DIAGNOSIS — C7931 Secondary malignant neoplasm of brain: Secondary | ICD-10-CM | POA: Diagnosis not present

## 2023-08-01 DIAGNOSIS — Z79899 Other long term (current) drug therapy: Secondary | ICD-10-CM | POA: Diagnosis not present

## 2023-08-04 ENCOUNTER — Institutional Professional Consult (permissible substitution): Admit: 2023-08-04 | Discharge: 2023-08-04 | Payer: MEDICARE

## 2023-08-04 ENCOUNTER — Ambulatory Visit: Admit: 2023-08-04 | Discharge: 2023-08-04 | Payer: MEDICARE

## 2023-08-04 DIAGNOSIS — C50811 Malignant neoplasm of overlapping sites of right female breast: Principal | ICD-10-CM

## 2023-08-04 DIAGNOSIS — R110 Nausea: Principal | ICD-10-CM

## 2023-08-04 DIAGNOSIS — C50919 Malignant neoplasm of unspecified site of unspecified female breast: Principal | ICD-10-CM

## 2023-08-04 DIAGNOSIS — C7802 Secondary malignant neoplasm of left lung: Principal | ICD-10-CM

## 2023-08-04 DIAGNOSIS — R630 Anorexia: Principal | ICD-10-CM

## 2023-08-04 DIAGNOSIS — K21 Gastroesophageal reflux disease with esophagitis without hemorrhage: Principal | ICD-10-CM

## 2023-08-04 MED ORDER — SUCRALFATE 100 MG/ML ORAL SUSPENSION
Freq: Four times a day (QID) | ORAL | 11 refills | 30 days | Status: CP
Start: 2023-08-04 — End: 2024-08-03
  Filled 2023-08-04: qty 1200, 30d supply, fill #0

## 2023-08-04 MED ORDER — POTASSIUM CHLORIDE ER 10 MEQ TABLET,EXTENDED RELEASE
ORAL_TABLET | Freq: Three times a day (TID) | ORAL | 11 refills | 30 days | Status: CP
Start: 2023-08-04 — End: 2024-08-03
  Filled 2023-08-04: qty 90, 30d supply, fill #0

## 2023-08-14 ENCOUNTER — Other Ambulatory Visit: Payer: Self-pay | Admitting: Family Medicine

## 2023-08-14 MED ORDER — LEVOTHYROXINE SODIUM 50 MCG PO TABS: 50.0000 ug | ORAL_TABLET | Freq: Every day | ORAL | 0 refills | Status: AC

## 2023-08-14 NOTE — Telephone Encounter (Signed)
Needs labs- was to have at specialist.

## 2023-08-14 NOTE — Telephone Encounter (Signed)
Medication Refill - Medication:   Disp Refills Start End   levothyroxine (SYNTHROID) 25 MCG tablet         Has the patient contacted their pharmacy? Yes.   (The pt states Dr Laural Benes increased this medication to 2 / day, so now she is running out and needs new Rx. Preferred Pharmacy (with phone number or street name):  [facility] [place] [place] - [address]   Has the patient been seen for an appointment in the last year OR does the patient have an upcoming appointment? Yes.    Agent: Please be advised that RX refills may take up to 3 business days. We ask that you follow-up with your pharmacy.

## 2023-08-14 NOTE — Telephone Encounter (Signed)
Needs new prescription due to patient taking medication twice a day, routing for approval.

## 2023-08-17 DIAGNOSIS — K219 Gastro-esophageal reflux disease without esophagitis: Principal | ICD-10-CM

## 2023-08-17 MED ORDER — ESOMEPRAZOLE MAGNESIUM 40 MG CAPSULE,DELAYED RELEASE
ORAL_CAPSULE | Freq: Two times a day (BID) | ORAL | 0 refills | 30 days | Status: CP
Start: 2023-08-17 — End: 2023-09-16

## 2023-08-25 ENCOUNTER — Ambulatory Visit: Admit: 2023-08-25 | Discharge: 2023-08-25 | Payer: MEDICARE

## 2023-08-25 ENCOUNTER — Institutional Professional Consult (permissible substitution): Admit: 2023-08-25 | Discharge: 2023-08-25 | Payer: MEDICARE

## 2023-08-25 DIAGNOSIS — C50919 Malignant neoplasm of unspecified site of unspecified female breast: Principal | ICD-10-CM

## 2023-08-25 DIAGNOSIS — C50811 Malignant neoplasm of overlapping sites of right female breast: Principal | ICD-10-CM

## 2023-08-25 DIAGNOSIS — S0990XA Unspecified injury of head, initial encounter: Principal | ICD-10-CM

## 2023-08-25 DIAGNOSIS — C7931 Secondary malignant neoplasm of brain: Principal | ICD-10-CM

## 2023-08-25 DIAGNOSIS — G8929 Other chronic pain: Principal | ICD-10-CM

## 2023-08-25 DIAGNOSIS — R630 Anorexia: Principal | ICD-10-CM

## 2023-08-25 DIAGNOSIS — R110 Nausea: Principal | ICD-10-CM

## 2023-08-25 DIAGNOSIS — R519 Chronic nonintractable headache, unspecified headache type: Principal | ICD-10-CM

## 2023-09-05 ENCOUNTER — Telehealth: Admit: 2023-09-05 | Discharge: 2023-09-06 | Payer: MEDICARE

## 2023-09-05 DIAGNOSIS — Z515 Encounter for palliative care: Principal | ICD-10-CM

## 2023-09-05 DIAGNOSIS — K21 Gastroesophageal reflux disease with esophagitis without hemorrhage: Principal | ICD-10-CM

## 2023-09-05 DIAGNOSIS — R5383 Other fatigue: Principal | ICD-10-CM

## 2023-09-05 DIAGNOSIS — C50919 Malignant neoplasm of unspecified site of unspecified female breast: Principal | ICD-10-CM

## 2023-09-07 ENCOUNTER — Ambulatory Visit: Admit: 2023-09-07 | Discharge: 2023-09-07 | Payer: MEDICARE

## 2023-09-07 DIAGNOSIS — K225 Diverticulum of esophagus, acquired: Secondary | ICD-10-CM | POA: Diagnosis not present

## 2023-09-07 DIAGNOSIS — R1319 Other dysphagia: Secondary | ICD-10-CM | POA: Diagnosis not present

## 2023-09-11 ENCOUNTER — Ambulatory Visit: Payer: 59 | Admitting: Family Medicine

## 2023-09-11 DIAGNOSIS — F5101 Primary insomnia: Principal | ICD-10-CM

## 2023-09-11 MED ORDER — TRAZODONE 50 MG TABLET
ORAL_TABLET | Freq: Every evening | ORAL | 0 refills | 30 days | PRN
Start: 2023-09-11 — End: ?

## 2023-09-12 MED ORDER — TRAZODONE 50 MG TABLET
ORAL_TABLET | Freq: Every evening | ORAL | 0 refills | 30 days | PRN
Start: 2023-09-12 — End: ?

## 2023-09-15 ENCOUNTER — Ambulatory Visit: Admit: 2023-09-15 | Discharge: 2023-09-15 | Payer: MEDICARE

## 2023-09-15 ENCOUNTER — Institutional Professional Consult (permissible substitution): Admit: 2023-09-15 | Discharge: 2023-09-15 | Payer: MEDICARE

## 2023-09-15 DIAGNOSIS — R630 Anorexia: Principal | ICD-10-CM

## 2023-09-15 DIAGNOSIS — K900 Celiac disease: Principal | ICD-10-CM

## 2023-09-15 DIAGNOSIS — C50919 Malignant neoplasm of unspecified site of unspecified female breast: Principal | ICD-10-CM

## 2023-09-15 DIAGNOSIS — Z1159 Encounter for screening for other viral diseases: Principal | ICD-10-CM

## 2023-09-15 DIAGNOSIS — F1190 Opioid use, unspecified, uncomplicated: Principal | ICD-10-CM

## 2023-09-15 DIAGNOSIS — R197 Diarrhea, unspecified: Principal | ICD-10-CM

## 2023-09-15 DIAGNOSIS — K219 Gastro-esophageal reflux disease without esophagitis: Principal | ICD-10-CM

## 2023-09-15 DIAGNOSIS — R110 Nausea: Principal | ICD-10-CM

## 2023-09-15 DIAGNOSIS — C50811 Malignant neoplasm of overlapping sites of right female breast: Principal | ICD-10-CM

## 2023-09-15 DIAGNOSIS — E039 Hypothyroidism, unspecified: Principal | ICD-10-CM

## 2023-09-15 DIAGNOSIS — F32A Depression, unspecified depression type: Principal | ICD-10-CM

## 2023-09-15 DIAGNOSIS — Z79899 Other long term (current) drug therapy: Principal | ICD-10-CM

## 2023-09-15 DIAGNOSIS — R7989 Other specified abnormal findings of blood chemistry: Principal | ICD-10-CM

## 2023-09-15 DIAGNOSIS — C7931 Secondary malignant neoplasm of brain: Secondary | ICD-10-CM | POA: Diagnosis not present

## 2023-09-15 DIAGNOSIS — Z6820 Body mass index (BMI) 20.0-20.9, adult: Secondary | ICD-10-CM | POA: Diagnosis not present

## 2023-09-15 DIAGNOSIS — F17210 Nicotine dependence, cigarettes, uncomplicated: Secondary | ICD-10-CM | POA: Diagnosis not present

## 2023-09-15 DIAGNOSIS — Z713 Dietary counseling and surveillance: Secondary | ICD-10-CM | POA: Diagnosis not present

## 2023-09-15 DIAGNOSIS — E876 Hypokalemia: Secondary | ICD-10-CM | POA: Diagnosis not present

## 2023-09-15 DIAGNOSIS — G629 Polyneuropathy, unspecified: Secondary | ICD-10-CM | POA: Diagnosis not present

## 2023-09-15 DIAGNOSIS — C7800 Secondary malignant neoplasm of unspecified lung: Secondary | ICD-10-CM | POA: Diagnosis not present

## 2023-09-15 MED ORDER — ESOMEPRAZOLE MAGNESIUM 40 MG CAPSULE,DELAYED RELEASE
ORAL_CAPSULE | Freq: Two times a day (BID) | ORAL | 5 refills | 30 days | Status: CP
Start: 2023-09-15 — End: ?

## 2023-09-15 MED ORDER — MAGNESIUM OXIDE 500 MG CAPSULE
ORAL_CAPSULE | Freq: Every day | ORAL | 11 refills | 0 days | Status: CP
Start: 2023-09-15 — End: ?

## 2023-09-15 MED ORDER — ONDANSETRON HCL 8 MG TABLET
ORAL_TABLET | Freq: Three times a day (TID) | ORAL | 1 refills | 20 days | Status: CP | PRN
Start: 2023-09-15 — End: ?

## 2023-09-15 MED ORDER — VENLAFAXINE ER 150 MG CAPSULE,EXTENDED RELEASE 24 HR
ORAL_CAPSULE | Freq: Every day | ORAL | 3 refills | 30 days | Status: CP
Start: 2023-09-15 — End: 2024-01-13

## 2023-09-15 MED ORDER — LEVOTHYROXINE 50 MCG TABLET
ORAL_TABLET | Freq: Every day | ORAL | 11 refills | 30 days | Status: CP
Start: 2023-09-15 — End: 2024-09-14

## 2023-09-15 MED ORDER — NALOXONE 4 MG/ACTUATION NASAL SPRAY
0 refills | 0 days | Status: CP
Start: 2023-09-15 — End: ?

## 2023-09-15 MED ORDER — DIPHENOXYLATE-ATROPINE 2.5 MG-0.025 MG TABLET
ORAL_TABLET | Freq: Four times a day (QID) | ORAL | 0 refills | 8 days | Status: CP | PRN
Start: 2023-09-15 — End: ?

## 2023-09-15 MED ORDER — DRONABINOL 2.5 MG CAPSULE
ORAL_CAPSULE | Freq: Two times a day (BID) | ORAL | 0 refills | 14.00000 days | Status: CP
Start: 2023-09-15 — End: 2023-09-15

## 2023-09-15 MED ORDER — PROCHLORPERAZINE MALEATE 10 MG TABLET
ORAL_TABLET | Freq: Four times a day (QID) | ORAL | 2 refills | 8 days | Status: CP | PRN
Start: 2023-09-15 — End: ?

## 2023-09-21 DIAGNOSIS — R630 Anorexia: Principal | ICD-10-CM

## 2023-09-21 MED ORDER — DRONABINOL 2.5 MG CAPSULE
ORAL_CAPSULE | Freq: Two times a day (BID) | ORAL | 0 refills | 14 days | Status: CP
Start: 2023-09-21 — End: 2023-10-05

## 2023-09-22 ENCOUNTER — Telehealth: Admit: 2023-09-22 | Discharge: 2023-09-23 | Payer: MEDICARE

## 2023-09-22 DIAGNOSIS — K21 Gastroesophageal reflux disease with esophagitis without hemorrhage: Principal | ICD-10-CM

## 2023-09-28 ENCOUNTER — Ambulatory Visit: Admit: 2023-09-28 | Discharge: 2023-09-29 | Payer: MEDICARE

## 2023-09-28 DIAGNOSIS — R519 Headache, unspecified: Secondary | ICD-10-CM | POA: Diagnosis not present

## 2023-09-28 DIAGNOSIS — C7931 Secondary malignant neoplasm of brain: Secondary | ICD-10-CM | POA: Diagnosis not present

## 2023-09-28 DIAGNOSIS — Z85841 Personal history of malignant neoplasm of brain: Secondary | ICD-10-CM | POA: Diagnosis not present

## 2023-09-28 DIAGNOSIS — G8929 Other chronic pain: Secondary | ICD-10-CM | POA: Diagnosis not present

## 2023-10-04 ENCOUNTER — Telehealth: Admit: 2023-10-04 | Discharge: 2023-10-05 | Payer: MEDICARE

## 2023-10-04 DIAGNOSIS — G4700 Insomnia, unspecified: Secondary | ICD-10-CM | POA: Diagnosis not present

## 2023-10-04 DIAGNOSIS — G629 Polyneuropathy, unspecified: Secondary | ICD-10-CM | POA: Diagnosis not present

## 2023-10-04 DIAGNOSIS — Z515 Encounter for palliative care: Secondary | ICD-10-CM | POA: Diagnosis not present

## 2023-10-04 DIAGNOSIS — C50919 Malignant neoplasm of unspecified site of unspecified female breast: Secondary | ICD-10-CM | POA: Diagnosis not present

## 2023-10-05 MED ORDER — OLANZAPINE 5 MG TABLET
ORAL_TABLET | Freq: Every evening | ORAL | 0 refills | 30 days | Status: CP
Start: 2023-10-05 — End: 2023-11-04

## 2023-10-05 MED ORDER — PREGABALIN 200 MG CAPSULE
ORAL_CAPSULE | Freq: Two times a day (BID) | ORAL | 3 refills | 30 days | Status: CP
Start: 2023-10-05 — End: ?

## 2023-10-06 ENCOUNTER — Ambulatory Visit: Admit: 2023-10-06 | Discharge: 2023-10-06 | Payer: MEDICARE

## 2023-10-06 ENCOUNTER — Institutional Professional Consult (permissible substitution): Admit: 2023-10-06 | Discharge: 2023-10-06 | Payer: MEDICARE

## 2023-10-06 DIAGNOSIS — R5383 Other fatigue: Principal | ICD-10-CM

## 2023-10-06 DIAGNOSIS — C50919 Malignant neoplasm of unspecified site of unspecified female breast: Principal | ICD-10-CM

## 2023-10-06 DIAGNOSIS — R110 Nausea: Principal | ICD-10-CM

## 2023-10-06 DIAGNOSIS — C50811 Malignant neoplasm of overlapping sites of right female breast: Principal | ICD-10-CM

## 2023-10-06 DIAGNOSIS — Z5112 Encounter for antineoplastic immunotherapy: Secondary | ICD-10-CM | POA: Diagnosis not present

## 2023-10-06 MED FILL — POTASSIUM CHLORIDE ER 10 MEQ TABLET,EXTENDED RELEASE: ORAL | 30 days supply | Qty: 90 | Fill #1

## 2023-10-27 ENCOUNTER — Institutional Professional Consult (permissible substitution): Admit: 2023-10-27 | Discharge: 2023-10-27 | Payer: MEDICARE

## 2023-10-27 ENCOUNTER — Ambulatory Visit: Admit: 2023-10-27 | Discharge: 2023-10-27 | Payer: MEDICARE

## 2023-10-27 DIAGNOSIS — Z79899 Other long term (current) drug therapy: Principal | ICD-10-CM

## 2023-10-27 DIAGNOSIS — R630 Anorexia: Principal | ICD-10-CM

## 2023-10-27 DIAGNOSIS — C50919 Malignant neoplasm of unspecified site of unspecified female breast: Principal | ICD-10-CM

## 2023-10-27 DIAGNOSIS — C50811 Malignant neoplasm of overlapping sites of right female breast: Principal | ICD-10-CM

## 2023-10-27 DIAGNOSIS — C7931 Secondary malignant neoplasm of brain: Principal | ICD-10-CM

## 2023-10-27 DIAGNOSIS — C7802 Secondary malignant neoplasm of left lung: Principal | ICD-10-CM

## 2023-10-27 DIAGNOSIS — R110 Nausea: Principal | ICD-10-CM

## 2023-10-27 DIAGNOSIS — Z5181 Encounter for therapeutic drug level monitoring: Principal | ICD-10-CM

## 2023-10-27 DIAGNOSIS — G629 Polyneuropathy, unspecified: Secondary | ICD-10-CM | POA: Diagnosis not present

## 2023-10-27 DIAGNOSIS — K219 Gastro-esophageal reflux disease without esophagitis: Secondary | ICD-10-CM | POA: Diagnosis not present

## 2023-10-27 DIAGNOSIS — J984 Other disorders of lung: Secondary | ICD-10-CM | POA: Diagnosis not present

## 2023-10-27 DIAGNOSIS — F17210 Nicotine dependence, cigarettes, uncomplicated: Secondary | ICD-10-CM | POA: Diagnosis not present

## 2023-10-27 DIAGNOSIS — Z5112 Encounter for antineoplastic immunotherapy: Secondary | ICD-10-CM | POA: Diagnosis not present

## 2023-10-27 DIAGNOSIS — E039 Hypothyroidism, unspecified: Secondary | ICD-10-CM | POA: Diagnosis not present

## 2023-10-27 DIAGNOSIS — E876 Hypokalemia: Secondary | ICD-10-CM | POA: Diagnosis not present

## 2023-11-13 ENCOUNTER — Telehealth: Admit: 2023-11-13 | Discharge: 2023-11-14 | Payer: MEDICARE

## 2023-11-13 DIAGNOSIS — C50919 Malignant neoplasm of unspecified site of unspecified female breast: Principal | ICD-10-CM

## 2023-11-13 DIAGNOSIS — Z515 Encounter for palliative care: Principal | ICD-10-CM

## 2023-11-14 ENCOUNTER — Ambulatory Visit: Admit: 2023-11-14 | Discharge: 2023-11-15 | Payer: MEDICARE

## 2023-11-14 DIAGNOSIS — C50919 Malignant neoplasm of unspecified site of unspecified female breast: Principal | ICD-10-CM

## 2023-11-14 DIAGNOSIS — Z5181 Encounter for therapeutic drug level monitoring: Secondary | ICD-10-CM | POA: Diagnosis not present

## 2023-11-14 DIAGNOSIS — C7931 Secondary malignant neoplasm of brain: Secondary | ICD-10-CM | POA: Diagnosis not present

## 2023-11-14 DIAGNOSIS — C50811 Malignant neoplasm of overlapping sites of right female breast: Secondary | ICD-10-CM | POA: Diagnosis not present

## 2023-11-14 DIAGNOSIS — I081 Rheumatic disorders of both mitral and tricuspid valves: Secondary | ICD-10-CM | POA: Diagnosis not present

## 2023-11-14 DIAGNOSIS — Z79899 Other long term (current) drug therapy: Secondary | ICD-10-CM | POA: Diagnosis not present

## 2023-11-14 DIAGNOSIS — R918 Other nonspecific abnormal finding of lung field: Secondary | ICD-10-CM | POA: Diagnosis not present

## 2023-11-14 DIAGNOSIS — Z171 Estrogen receptor negative status [ER-]: Secondary | ICD-10-CM | POA: Diagnosis not present

## 2023-11-14 DIAGNOSIS — R911 Solitary pulmonary nodule: Secondary | ICD-10-CM | POA: Diagnosis not present

## 2023-11-14 DIAGNOSIS — I272 Pulmonary hypertension, unspecified: Secondary | ICD-10-CM | POA: Diagnosis not present

## 2023-11-17 ENCOUNTER — Ambulatory Visit: Admit: 2023-11-17 | Discharge: 2023-11-17 | Payer: MEDICARE

## 2023-11-17 ENCOUNTER — Institutional Professional Consult (permissible substitution): Admit: 2023-11-17 | Discharge: 2023-11-17 | Payer: MEDICARE

## 2023-11-17 DIAGNOSIS — R110 Nausea: Principal | ICD-10-CM

## 2023-11-17 DIAGNOSIS — C50919 Malignant neoplasm of unspecified site of unspecified female breast: Principal | ICD-10-CM

## 2023-11-17 DIAGNOSIS — C50811 Malignant neoplasm of overlapping sites of right female breast: Principal | ICD-10-CM

## 2023-11-17 DIAGNOSIS — Z5112 Encounter for antineoplastic immunotherapy: Secondary | ICD-10-CM | POA: Diagnosis not present

## 2023-11-20 ENCOUNTER — Other Ambulatory Visit: Payer: Self-pay | Admitting: *Deleted

## 2023-11-20 NOTE — Addendum Note (Signed)
Addended by: Pablo Ledger on: 11/20/2023 04:59 PM   Modules accepted: Orders

## 2023-11-20 NOTE — Telephone Encounter (Signed)
Second attempt to contact patient- no answer- left message to call office

## 2023-11-20 NOTE — Telephone Encounter (Signed)
  Attempted to contact patient-no answer- left message to call office.    Pt states her boyfriend thinks he had walking PNA.  He is feeling better, no fever in 4 days.  He went to grocery store yesterday and according to him did good.  The question is:  is she safe to be around him now? They are hoping to spend Thanksgiving together.

## 2023-11-20 NOTE — Telephone Encounter (Signed)
See patients question below

## 2023-11-20 NOTE — Telephone Encounter (Signed)
If he has not had a fever and is not still sick for more than 24 hours before she sees him (cough is one thing- but having chills and sweats and not feeling well) She's likely OK

## 2023-11-20 NOTE — Telephone Encounter (Signed)
Called and notified patient of Dr. Henriette Combs message.   While on the phone, patient is requesting to have a refill on her Klonopin. States she has gotten all scripts from the pharmacy and has 5 tablets left.

## 2023-11-20 NOTE — Telephone Encounter (Signed)
Third attempt to contact patient- no answer- left message to call office on VM

## 2023-11-21 ENCOUNTER — Ambulatory Visit: Admit: 2023-11-21 | Discharge: 2023-11-21 | Payer: MEDICARE

## 2023-11-21 DIAGNOSIS — C50811 Malignant neoplasm of overlapping sites of right female breast: Principal | ICD-10-CM

## 2023-11-21 DIAGNOSIS — C50919 Malignant neoplasm of unspecified site of unspecified female breast: Principal | ICD-10-CM

## 2023-11-21 DIAGNOSIS — C7802 Secondary malignant neoplasm of left lung: Secondary | ICD-10-CM | POA: Diagnosis not present

## 2023-11-21 NOTE — Telephone Encounter (Signed)
Overdue appt. Virtual OK

## 2023-11-21 NOTE — Telephone Encounter (Signed)
Called and spoke to patient, she stated that she would get her sister [name] to schedule her appointment on MyChart.  I did let her know that is okay for it to be virtual.

## 2023-11-22 MED ORDER — CLONAZEPAM 1 MG PO TABS: 1.0000 mg | ORAL_TABLET | Freq: Two times a day (BID) | ORAL | 0 refills | Status: DC

## 2023-11-29 ENCOUNTER — Encounter: Payer: Self-pay | Admitting: Family Medicine

## 2023-11-29 ENCOUNTER — Telehealth (INDEPENDENT_AMBULATORY_CARE_PROVIDER_SITE_OTHER): Payer: 59 | Admitting: Family Medicine

## 2023-11-29 VITALS — Wt 106.0 lb

## 2023-11-29 DIAGNOSIS — R519 Headache, unspecified: Secondary | ICD-10-CM

## 2023-11-29 DIAGNOSIS — R197 Diarrhea, unspecified: Secondary | ICD-10-CM

## 2023-11-29 DIAGNOSIS — R634 Abnormal weight loss: Secondary | ICD-10-CM | POA: Diagnosis not present

## 2023-11-29 DIAGNOSIS — F419 Anxiety disorder, unspecified: Secondary | ICD-10-CM | POA: Diagnosis not present

## 2023-11-29 MED ORDER — CLONAZEPAM 1 MG PO TABS: 1.0000 mg | ORAL_TABLET | Freq: Two times a day (BID) | ORAL | 2 refills | Status: DC

## 2023-11-29 NOTE — Progress Notes (Signed)
Wt 106 lb (48.1 kg)   BMI 19.39 kg/m    Subjective:    Patient ID: Barbara Malone, female    DOB: 12-10-1956, 67 y.o.   MRN: [id number]  HPI: Barbara Malone is a 67 y.o. female  Chief Complaint  Patient presents with   Anxiety   Headache    Patient says she is wanting to know with her brain tumors, if that could be causes her 2-3 headaches throughout the week. Patient says it is starting to effect her eyes like blurred vision and watering so bad.   Weight Loss    Patient says she has noticed she has been losing weight due to loss of appetite or just not cooking anymore or finding anything she wants to eat.    Has been having diarrhea and has been losing weight because of it. She has been taking lamotil. She got mad and took off- she didn't take her lamotil of several days. She recently found it again. She also can't chew her food properly and that has been making it hard for her to eat. She has been nauseous and not eating. She has medicine to take from her oncologist, but she's not sure how much it is helping.   ANXIETY/STRESS Duration: chronic Status:stable Anxious mood: yes  Excessive worrying: no Irritability: no  Sweating: no Nausea: no Palpitations:no Hyperventilation: no Panic attacks: no Agoraphobia: no  Obscessions/compulsions: no Depressed mood: no    07/31/2023    1:13 PM 06/06/2023    9:34 AM 04/27/2023    1:24 PM 03/08/2023   10:15 AM  Depression screen PHQ 2/9  Decreased Interest 2 3 3 3   Down, Depressed, Hopeless 2 2 3 1   PHQ - 2 Score 4 5 6 4   Altered sleeping 1 2 3 3   Tired, decreased energy 1 3 3 2   Change in appetite 3 3 3 3   Feeling bad or failure about yourself  1 3 3  0  Trouble concentrating 3 3 3  0  Moving slowly or fidgety/restless 1 3 3  0  Suicidal thoughts 0 1 1 0  PHQ-9 Score 14 23 25 12   Difficult doing work/chores Somewhat difficult  Extremely dIfficult Somewhat difficult      07/31/2023    1:13 PM 06/06/2023    9:34 AM 04/27/2023    1:25 PM  03/08/2023   10:17 AM  GAD 7 : Generalized Anxiety Score  Nervous, Anxious, on Edge 2 3 2 1   Control/stop worrying 3 3 3 2   Worry too much - different things 3 2 3 1   Trouble relaxing 3 2 3 1   Restless 1 0 0 0  Easily annoyed or irritable 0 3 3 2   Afraid - awful might happen 3 3 3  0  Total GAD 7 Score 15 16 17 7   Anxiety Difficulty Very difficult Extremely difficult  Somewhat difficult   Anhedonia: no Weight changes: no Insomnia: no   Hypersomnia: no Fatigue/loss of energy: no Feelings of worthlessness: no Feelings of guilt: no Impaired concentration/indecisiveness: no Suicidal ideations: no  Crying spells: no Recent Stressors/Life Changes: no   Relationship problems: no   Family stress: no     Financial stress: no    Job stress: no    Recent death/loss: no  Relevant past medical, surgical, family and social history reviewed and updated as indicated. Interim medical history since our last visit reviewed. Allergies and medications reviewed and updated.  Review of Systems  Constitutional: Negative.   Respiratory: Negative.  Cardiovascular: Negative.   Musculoskeletal: Negative.   Neurological:  Positive for headaches. Negative for dizziness, tremors, seizures, syncope, facial asymmetry, speech difficulty, weakness, light-headedness and numbness.  Psychiatric/Behavioral:  Negative for agitation, behavioral problems, confusion, decreased concentration, dysphoric mood, hallucinations, self-injury, sleep disturbance and suicidal ideas. The patient is nervous/anxious. The patient is not hyperactive.     Per HPI unless specifically indicated above     Objective:    Wt 106 lb (48.1 kg)   BMI 19.39 kg/m   Wt Readings from Last 3 Encounters:  11/29/23 106 lb (48.1 kg)  07/31/23 113 lb 3.2 oz (51.3 kg)  06/21/23 113 lb (51.3 kg)    Physical Exam Vitals and nursing note reviewed.  Constitutional:      General: She is not in acute distress.    Appearance: Normal  appearance. She is not ill-appearing, toxic-appearing or diaphoretic.  HENT:     Head: Normocephalic and atraumatic.     Right Ear: External ear normal.     Left Ear: External ear normal.     Nose: Nose normal.     Mouth/Throat:     Mouth: Mucous membranes are moist.     Pharynx: Oropharynx is clear.  Eyes:     General: No scleral icterus.       Right eye: No discharge.        Left eye: No discharge.     Conjunctiva/sclera: Conjunctivae normal.     Pupils: Pupils are equal, round, and reactive to light.  Pulmonary:     Effort: Pulmonary effort is normal. No respiratory distress.     Comments: Speaking in full sentences Musculoskeletal:        General: Normal range of motion.     Cervical back: Normal range of motion.  Skin:    Coloration: Skin is not jaundiced or pale.     Findings: No bruising, erythema, lesion or rash.  Neurological:     Mental Status: She is alert and oriented to person, place, and time. Mental status is at baseline.  Psychiatric:        Mood and Affect: Mood normal.        Behavior: Behavior normal.        Thought Content: Thought content normal.        Judgment: Judgment normal.     Results for orders placed or performed in visit on 07/31/23  HM DEXA SCAN  Result Value Ref Range   HM Dexa Scan see report scanned into chart       Assessment & Plan:   Problem List Items Addressed This Visit       Other   Anxiety - Primary    Under good control on current regimen. Continue current regimen. Continue to monitor. Call with any concerns. Refills given today for 3 months. Follow up in 3 months.        Other Visit Diagnoses     Weight loss       Encouraged her to reach out to her oncologist. Due to see them in a couple of weeks.   Diarrhea, unspecified type       Encouraged her to reach out to her oncologist. Due to see them in a couple of weeks.   Nonintractable headache, unspecified chronicity pattern, unspecified headache type        Encouraged her to reach out to her oncologist. Due to see them in a couple of weeks.   Relevant Medications   clonazePAM (KLONOPIN) 1 MG tablet  Follow up plan: Return in about 3 months (around 02/27/2024).   This visit was completed via video visit through MyChart due to the restrictions of the COVID-19 pandemic. All issues as above were discussed and addressed. Physical exam was done as above through visual confirmation on video through MyChart. If it was felt that the patient should be evaluated in the office, they were directed there. The patient verbally consented to this visit. Location of the patient: home Location of the provider: work Those involved with this call:  Provider: Olevia Perches, DO CMA: Malen Gauze, CMA Front Desk/Registration:  Servando Snare   Time spent on call:  15 minutes with patient face to face via video conference. More than 50% of this time was spent in counseling and coordination of care. 23 minutes total spent in review of patient's record and preparation of their chart.

## 2023-11-30 NOTE — Progress Notes (Signed)
Attempted to reach patient's sister per DPR, LVM to call office back to get patient scheduled for an in persons

## 2023-11-30 NOTE — Progress Notes (Signed)
Attempted to reach patient's sister, LVM to call office back to schedule an appointment for patient within 3 months and in person.  Put in CRM.

## 2023-12-01 NOTE — Assessment & Plan Note (Signed)
Under good control on current regimen. Continue current regimen. Continue to monitor. Call with any concerns. Refills given today for 3 months. Follow up in 3 months.

## 2023-12-04 DIAGNOSIS — C7931 Secondary malignant neoplasm of brain: Principal | ICD-10-CM

## 2023-12-08 ENCOUNTER — Ambulatory Visit: Admit: 2023-12-08 | Discharge: 2023-12-08 | Payer: MEDICARE

## 2023-12-08 ENCOUNTER — Institutional Professional Consult (permissible substitution): Admit: 2023-12-08 | Discharge: 2023-12-08 | Payer: MEDICARE

## 2023-12-08 DIAGNOSIS — R197 Diarrhea, unspecified: Principal | ICD-10-CM

## 2023-12-08 DIAGNOSIS — C50919 Malignant neoplasm of unspecified site of unspecified female breast: Principal | ICD-10-CM

## 2023-12-08 DIAGNOSIS — C50811 Malignant neoplasm of overlapping sites of right female breast: Principal | ICD-10-CM

## 2023-12-08 DIAGNOSIS — I081 Rheumatic disorders of both mitral and tricuspid valves: Secondary | ICD-10-CM | POA: Diagnosis not present

## 2023-12-08 DIAGNOSIS — Z681 Body mass index (BMI) 19 or less, adult: Secondary | ICD-10-CM | POA: Diagnosis not present

## 2023-12-08 DIAGNOSIS — Z5941 Food insecurity: Secondary | ICD-10-CM | POA: Diagnosis not present

## 2023-12-08 DIAGNOSIS — Z923 Personal history of irradiation: Secondary | ICD-10-CM | POA: Diagnosis not present

## 2023-12-08 DIAGNOSIS — C7931 Secondary malignant neoplasm of brain: Secondary | ICD-10-CM | POA: Diagnosis not present

## 2023-12-08 DIAGNOSIS — I272 Pulmonary hypertension, unspecified: Secondary | ICD-10-CM | POA: Diagnosis not present

## 2023-12-08 DIAGNOSIS — Z713 Dietary counseling and surveillance: Secondary | ICD-10-CM | POA: Diagnosis not present

## 2023-12-08 DIAGNOSIS — R7989 Other specified abnormal findings of blood chemistry: Secondary | ICD-10-CM | POA: Diagnosis not present

## 2023-12-08 DIAGNOSIS — K219 Gastro-esophageal reflux disease without esophagitis: Secondary | ICD-10-CM | POA: Diagnosis not present

## 2023-12-08 DIAGNOSIS — C7802 Secondary malignant neoplasm of left lung: Secondary | ICD-10-CM | POA: Diagnosis not present

## 2023-12-08 DIAGNOSIS — Z1159 Encounter for screening for other viral diseases: Secondary | ICD-10-CM | POA: Diagnosis not present

## 2023-12-08 DIAGNOSIS — R638 Other symptoms and signs concerning food and fluid intake: Secondary | ICD-10-CM | POA: Diagnosis not present

## 2023-12-08 DIAGNOSIS — K08409 Partial loss of teeth, unspecified cause, unspecified class: Secondary | ICD-10-CM | POA: Diagnosis not present

## 2023-12-08 DIAGNOSIS — I340 Nonrheumatic mitral (valve) insufficiency: Secondary | ICD-10-CM | POA: Diagnosis not present

## 2023-12-08 DIAGNOSIS — Z79899 Other long term (current) drug therapy: Secondary | ICD-10-CM | POA: Diagnosis not present

## 2023-12-08 DIAGNOSIS — R1310 Dysphagia, unspecified: Secondary | ICD-10-CM | POA: Diagnosis not present

## 2023-12-08 DIAGNOSIS — F17210 Nicotine dependence, cigarettes, uncomplicated: Secondary | ICD-10-CM | POA: Diagnosis not present

## 2023-12-08 DIAGNOSIS — E039 Hypothyroidism, unspecified: Secondary | ICD-10-CM | POA: Diagnosis not present

## 2023-12-08 DIAGNOSIS — K900 Celiac disease: Secondary | ICD-10-CM | POA: Diagnosis not present

## 2023-12-08 DIAGNOSIS — Z171 Estrogen receptor negative status [ER-]: Secondary | ICD-10-CM | POA: Diagnosis not present

## 2023-12-08 DIAGNOSIS — G629 Polyneuropathy, unspecified: Secondary | ICD-10-CM | POA: Diagnosis not present

## 2023-12-08 DIAGNOSIS — Z5112 Encounter for antineoplastic immunotherapy: Secondary | ICD-10-CM | POA: Diagnosis not present

## 2023-12-08 MED ORDER — OLANZAPINE 5 MG TABLET
ORAL_TABLET | Freq: Every evening | ORAL | 2 refills | 30.00 days | Status: CP
Start: 2023-12-08 — End: 2024-12-07

## 2023-12-08 MED ORDER — DIPHENOXYLATE-ATROPINE 2.5 MG-0.025 MG TABLET
ORAL_TABLET | Freq: Four times a day (QID) | ORAL | 0 refills | 8.00 days | Status: CP | PRN
Start: 2023-12-08 — End: ?

## 2023-12-11 DIAGNOSIS — C50811 Malignant neoplasm of overlapping sites of right female breast: Principal | ICD-10-CM

## 2023-12-29 ENCOUNTER — Encounter: Admit: 2023-12-29 | Discharge: 2023-12-29 | Payer: MEDICARE

## 2023-12-29 ENCOUNTER — Ambulatory Visit: Admit: 2023-12-29 | Discharge: 2023-12-29 | Payer: MEDICARE

## 2023-12-29 DIAGNOSIS — G620 Drug-induced polyneuropathy: Principal | ICD-10-CM

## 2023-12-29 DIAGNOSIS — C50811 Malignant neoplasm of overlapping sites of right female breast: Principal | ICD-10-CM

## 2023-12-29 DIAGNOSIS — C50919 Malignant neoplasm of unspecified site of unspecified female breast: Principal | ICD-10-CM

## 2023-12-29 DIAGNOSIS — T451X5A Adverse effect of antineoplastic and immunosuppressive drugs, initial encounter: Principal | ICD-10-CM

## 2023-12-29 DIAGNOSIS — Z5112 Encounter for antineoplastic immunotherapy: Secondary | ICD-10-CM | POA: Diagnosis not present

## 2023-12-29 DIAGNOSIS — C799 Secondary malignant neoplasm of unspecified site: Secondary | ICD-10-CM | POA: Diagnosis not present

## 2023-12-29 MED ORDER — PREGABALIN 200 MG CAPSULE
ORAL_CAPSULE | Freq: Two times a day (BID) | ORAL | 3 refills | 30.00 days | Status: CP
Start: 2023-12-29 — End: ?

## 2024-01-02 ENCOUNTER — Encounter: Admit: 2024-01-02 | Discharge: 2024-01-02 | Payer: MEDICARE

## 2024-01-02 ENCOUNTER — Ambulatory Visit: Admit: 2024-01-02 | Discharge: 2024-01-02 | Payer: MEDICARE

## 2024-01-02 DIAGNOSIS — G620 Drug-induced polyneuropathy: Secondary | ICD-10-CM | POA: Diagnosis not present

## 2024-01-02 DIAGNOSIS — T451X5A Adverse effect of antineoplastic and immunosuppressive drugs, initial encounter: Secondary | ICD-10-CM | POA: Diagnosis not present

## 2024-01-02 DIAGNOSIS — Z515 Encounter for palliative care: Secondary | ICD-10-CM | POA: Diagnosis not present

## 2024-01-02 DIAGNOSIS — C50919 Malignant neoplasm of unspecified site of unspecified female breast: Secondary | ICD-10-CM | POA: Diagnosis not present

## 2024-01-04 DIAGNOSIS — C50919 Malignant neoplasm of unspecified site of unspecified female breast: Principal | ICD-10-CM

## 2024-01-11 ENCOUNTER — Emergency Department: Admit: 2024-01-11 | Discharge: 2024-01-11 | Disposition: A | Discharge status: Home / Self Care | Payer: MEDICARE

## 2024-01-11 DIAGNOSIS — W19XXXA Unspecified fall, initial encounter: Principal | ICD-10-CM

## 2024-01-11 DIAGNOSIS — S0990XA Unspecified injury of head, initial encounter: Principal | ICD-10-CM

## 2024-01-11 DIAGNOSIS — C50919 Malignant neoplasm of unspecified site of unspecified female breast: Principal | ICD-10-CM

## 2024-01-11 DIAGNOSIS — S42001A Fracture of unspecified part of right clavicle, initial encounter for closed fracture: Principal | ICD-10-CM

## 2024-01-11 DIAGNOSIS — E039 Hypothyroidism, unspecified: Secondary | ICD-10-CM | POA: Diagnosis not present

## 2024-01-11 DIAGNOSIS — Z853 Personal history of malignant neoplasm of breast: Secondary | ICD-10-CM | POA: Diagnosis not present

## 2024-01-11 DIAGNOSIS — J449 Chronic obstructive pulmonary disease, unspecified: Secondary | ICD-10-CM | POA: Diagnosis not present

## 2024-01-11 DIAGNOSIS — Z79890 Hormone replacement therapy (postmenopausal): Secondary | ICD-10-CM | POA: Diagnosis not present

## 2024-01-11 DIAGNOSIS — E785 Hyperlipidemia, unspecified: Secondary | ICD-10-CM | POA: Diagnosis not present

## 2024-01-11 DIAGNOSIS — S42034A Nondisplaced fracture of lateral end of right clavicle, initial encounter for closed fracture: Secondary | ICD-10-CM | POA: Diagnosis not present

## 2024-01-11 DIAGNOSIS — M25521 Pain in right elbow: Secondary | ICD-10-CM | POA: Diagnosis not present

## 2024-01-11 DIAGNOSIS — F17210 Nicotine dependence, cigarettes, uncomplicated: Secondary | ICD-10-CM | POA: Diagnosis not present

## 2024-01-11 DIAGNOSIS — S60511A Abrasion of right hand, initial encounter: Secondary | ICD-10-CM | POA: Diagnosis not present

## 2024-01-11 DIAGNOSIS — S0093XA Contusion of unspecified part of head, initial encounter: Secondary | ICD-10-CM | POA: Diagnosis not present

## 2024-01-11 DIAGNOSIS — M5030 Other cervical disc degeneration, unspecified cervical region: Secondary | ICD-10-CM | POA: Diagnosis not present

## 2024-01-11 DIAGNOSIS — S40011A Contusion of right shoulder, initial encounter: Secondary | ICD-10-CM | POA: Diagnosis not present

## 2024-01-11 DIAGNOSIS — S42201A Unspecified fracture of upper end of right humerus, initial encounter for closed fracture: Secondary | ICD-10-CM | POA: Diagnosis not present

## 2024-01-11 DIAGNOSIS — S60512A Abrasion of left hand, initial encounter: Secondary | ICD-10-CM | POA: Diagnosis not present

## 2024-01-11 DIAGNOSIS — I10 Essential (primary) hypertension: Secondary | ICD-10-CM | POA: Diagnosis not present

## 2024-01-12 ENCOUNTER — Ambulatory Visit (INDEPENDENT_AMBULATORY_CARE_PROVIDER_SITE_OTHER): Payer: Medicare PPO | Admitting: Physician Assistant

## 2024-01-12 ENCOUNTER — Ambulatory Visit: Payer: Self-pay | Admitting: *Deleted

## 2024-01-12 ENCOUNTER — Encounter: Payer: Self-pay | Admitting: Physician Assistant

## 2024-01-12 VITALS — BP 136/70 | HR 85 | Resp 16 | Ht 62.0 in | Wt 100.0 lb

## 2024-01-12 DIAGNOSIS — S42001D Fracture of unspecified part of right clavicle, subsequent encounter for fracture with routine healing: Secondary | ICD-10-CM

## 2024-01-12 DIAGNOSIS — R296 Repeated falls: Secondary | ICD-10-CM | POA: Diagnosis not present

## 2024-01-12 DIAGNOSIS — Z7409 Other reduced mobility: Secondary | ICD-10-CM

## 2024-01-12 MED ORDER — TRAMADOL HCL 50 MG PO TABS: 50.0000 mg | ORAL_TABLET | Freq: Three times a day (TID) | ORAL | 0 refills | Status: AC | PRN

## 2024-01-12 NOTE — Telephone Encounter (Signed)
  Chief Complaint: severe pain right clavicle fx, requesting for [facility] assistance for self care Symptoms: fell Wednesday and fx right clavicle. Abrasions reported on hands and head. No bleeding. Pain level 8-10 tylenol ES not helping with pain unable to sleep last night . Difficulty dressing self and providing self care. Lives alone  Frequency: last night  Pertinent Negatives: Patient denies right arm swelling .  Disposition: [] ED /[] Urgent Care (no appt availability in office) / [x] Appointment(In office/virtual)/ []  [facility]/ [] Home Care/ [] Refused Recommended Disposition /[] [place] [facility]/ []  Follow-up with PCP Additional Notes:   No appt with PCP. Scheduled appt with E. Mecum PA. Same day [facility].     Reason for Disposition  [1] Shoulder pains with exertion (e.g., walking) AND [2] pain goes away on resting AND [3] not present now  Answer Assessment - Initial Assessment Questions 1. ONSET: "When did the pain start?"     Last night  2. LOCATION: "Where is the pain located?"     Right clavicle  3. PAIN: "How bad is the pain?" (Scale 1-10; or mild, moderate, severe)   - MILD (1-3): doesn't interfere with normal activities   - MODERATE (4-7): interferes with normal activities (e.g., work or school) or awakens from sleep   - SEVERE (8-10): excruciating pain, unable to do any normal activities, unable to move arm at all due to pain     Pain level 8-10 , unable to sleep last night  4. WORK OR EXERCISE: "Has there been any recent work or exercise that involved this part of the body?"     Fell Wednesday  5. CAUSE: "What do you think is causing the shoulder pain?"     Fall  6. OTHER SYMPTOMS: "Do you have any other symptoms?" (e.g., neck pain, swelling, rash, fever, numbness, weakness)     Right clavicle pain deep , throbbing pain , hand tingles at times. Was swelling  7. PREGNANCY: "Is there any chance you are pregnant?" "When was your last menstrual period?"      na  Protocols used: Shoulder Pain-A-AH

## 2024-01-12 NOTE — Progress Notes (Signed)
Acute Office Visit   Patient: Barbara Malone   DOB: Jul 09, 1956   68 y.o. Female  MRN: [id number] Visit Date: 01/12/2024  Today's healthcare provider: Oswaldo Conroy Rameen Gohlke, PA-C  Introduced myself to the patient as a Secondary school teacher and provided education on APPs in clinical practice.    Chief Complaint  Patient presents with   Clavicle Injury    Fractured Wed, went to [facility]   Subjective    HPI HPI     Clavicle Injury    Additional comments: Fractured Wed, went to [facility]      Last edited by Dollene Primrose, CMA on 01/12/2024  3:17 PM.       Discussed the use of AI scribe software for clinical note transcription with the patient, who gave verbal consent to proceed.  History of Present Illness   The patient, with a history of cancer, neuropathy, and multiple brain tumors, presents with a recent fall resulting in a clavicle fracture. The fall occurred when they bent over to pick something up and lost balance, running backwards into a brick building. They report a pain level of 10 out of 10, which is not relieved by Tylenol. The patient also reports difficulty sleeping despite taking Trazodone and Klonopin as needed.  The patient has a history of allergies to several pain medications including Oxycodone, Percocet, and Hydrocodone, which cause continuous itching. They have previously taken Tramadol and Dilaudid without adverse reactions.  The patient lives alone and reports difficulty with daily activities such as cooking and cleaning due to the injury. They also report frequent falls and balance issues, which they attribute to their brain tumors. They have a scheduled follow-up with orthopedics and are in the process of arranging home health and physical therapy services.         Medications: Outpatient Medications Prior to Visit  Medication Sig   acetic acid-hydrocortisone (VOSOL-HC) OTIC solution Place 5 drops into the right ear 3 (three) times daily.   albuterol (VENTOLIN  HFA) 108 (90 Base) MCG/ACT inhaler Inhale 2 puffs into the lungs every 6 (six) hours as needed for wheezing or shortness of breath.   anastrozole (ARIMIDEX) 1 MG tablet Take 1 mg by mouth daily.   butalbital-acetaminophen-caffeine (FIORICET) 50-325-40 MG tablet Take 1 tablet by mouth every 6 (six) hours as needed for headache.   carbamazepine (CARBATROL) 100 MG 12 hr capsule Take 1 capsule (100 mg total) by mouth 2 (two) times daily.   clobetasol ointment (TEMOVATE) 0.05 % Apply 1 Application topically 2 (two) times daily.   clonazePAM (KLONOPIN) 1 MG tablet Take 1 tablet (1 mg total) by mouth 2 (two) times daily.   dapagliflozin propanediol (FARXIGA) 5 MG TABS tablet Take 5 mg by mouth daily.   diclofenac (VOLTAREN) 50 MG EC tablet Take 50 mg by mouth 2 (two) times daily.   diclofenac Sodium (VOLTAREN) 1 % GEL Apply 2 g topically 4 (four) times daily.   diphenoxylate-atropine (LOMOTIL) 2.5-0.025 MG tablet Take by mouth 4 (four) times daily as needed for diarrhea or loose stools.   dorzolamide (TRUSOPT) 2 % ophthalmic solution 1 drop 3 (three) times daily.   esomeprazole (NEXIUM) 40 MG capsule Take 40 mg by mouth daily at 12 noon.   famotidine (PEPCID) 20 MG tablet Take 20 mg by mouth daily.   levothyroxine (SYNTHROID) 50 MCG tablet Take 1 tablet (50 mcg total) by mouth daily.   magic mouthwash (lidocaine, diphenhydrAMINE, alum & mag hydroxide) suspension  5 mLs.   Magnesium 500 MG CAPS Take 1 capsule (500 mg total) by mouth daily.   ondansetron (ZOFRAN) 4 MG tablet Take 4 mg by mouth every 8 (eight) hours as needed for nausea or vomiting.   potassium chloride SA (KLOR-CON M) 20 MEQ tablet Take 1 tablet (20 mEq total) by mouth 4 (four) times daily.   pregabalin (LYRICA) 200 MG capsule Take 200 mg by mouth 2 (two) times daily.   prochlorperazine (COMPAZINE) 10 MG tablet Take 10 mg by mouth every 6 (six) hours as needed for nausea or vomiting.   senna (SENOKOT) 8.6 MG TABS tablet Take 1 tablet by  mouth.   thiamine 50 MG tablet Take 50 mg by mouth daily.   traZODone (DESYREL) 50 MG tablet Take 50 mg by mouth at bedtime.   venlafaxine XR (EFFEXOR-XR) 150 MG 24 hr capsule Take 150 mg by mouth daily with breakfast.   No facility-administered medications prior to visit.    Review of Systems  Musculoskeletal:  Positive for arthralgias and myalgias.  Hematological:  Bruises/bleeds easily.        Objective    BP 136/70   Pulse 85   Resp 16   Ht 5\' 2"  (1.575 m)   Wt 100 lb (45.4 kg)   SpO2 95%   BMI 18.29 kg/m     Physical Exam Vitals reviewed.  Constitutional:      General: She is awake.     Appearance: She is well-groomed.     Comments: Patient appears underweight and frail   HENT:     Head: Normocephalic. Abrasion, contusion and laceration present.      Comments: Lacerations along the back of head  Pulmonary:     Effort: Pulmonary effort is normal.  Musculoskeletal:     Cervical back: Normal range of motion.     Comments: Bruising along right collarbone Obvious deformity along middle of right clavicle with severe tenderness on light palpation of area   Skin:    General: Skin is warm and dry.     Findings: Abrasion, bruising and laceration present.  Neurological:     General: No focal deficit present.     Mental Status: She is alert and oriented to person, place, and time.  Psychiatric:        Attention and Perception: Attention normal.        Mood and Affect: Mood normal.        Speech: Speech normal.        Behavior: Behavior normal. Behavior is cooperative.        Thought Content: Thought content normal.        Judgment: Judgment normal.       No results found for any visits on 01/12/24.  Assessment & Plan      Return in about 1 week (around 01/19/2024) for falls, clavicle fracture and home health coordination (with PCP).     Problem List Items Addressed This Visit   None Visit Diagnoses       Closed displaced fracture of right clavicle with  routine healing, unspecified part of clavicle, subsequent encounter    -  Primary   Relevant Medications   traMADol (ULTRAM) 50 MG tablet   Other Relevant Orders   Ambulatory referral to Home Health     Frequent falls       Relevant Orders   Ambulatory referral to Home Health     Limited mobility       Relevant Orders  Ambulatory referral to Home Health      Assessment and Plan    Clavicle Fracture Sustained a clavicle fracture after falling and hitting a brick building. Reports significant pain (10/10) and functional limitations, including difficulty performing daily activities. Current sling is inadequate. Upcoming orthopedics appointment on January 25, 2024 per patient  Unable to view visit information from [facility] visit but patient did have AVS detailing some results of imaging and work up. Unable to request records without authorization  - Follow up with orthopedics on January 25, 2024 - Consider a better sling or immobilization device at the orthopedic appointment  Pain Management Reports severe pain (10/10) and difficulty sleeping despite trazodone. Allergic to hydrocodone, oxycodone, and Percocet (itching). Tramadol ineffective in the past. Used Dilaudid without adverse reactions. Informed about the risk of excessive sedation when combining tramadol with trazodone, Klonopin, and Lyrica. - Prescribe tramadol 50-100 mg up to three times a day - Advise to avoid taking tramadol with trazodone, Klonopin, and Lyrica to prevent excessive sedation  - Follow up with Dr. Laural Benes next week for further pain management and assessment  Functional Limitations and Home Safety Experiencing significant functional limitations due to the clavicle fracture, including difficulty with household tasks. Lives alone with minimal family support. Reports frequent falls and balance issues, possibly related to brain tumors. Discussed need for home health services and physical therapy to ensure home  safety and prevent falls. - Send a referral for home health services to assist with daily activities and household tasks - Send a referral for physical therapy to assess and improve home safety and prevent falls - Follow up with Dr. Laural Benes to ensure home health and physical therapy services are initiated  Nutritional Support Trying to gain weight and expressed concerns about the ability to cook and maintain a healthy diet due to current physical limitations. Discussed potential nutritional support and meal assistance. - Discuss potential nutritional support and meal assistance with Dr. Laural Benes - Consider contacting [employer] for additional support services if needed  Follow-up - Follow up with Dr. Laural Benes next week - Ensure referrals for home health and physical therapy are processed - Bring the visit summary to Dr. Henriette Combs office for continuity of care.        Return in about 1 week (around 01/19/2024) for falls, clavicle fracture and home health coordination (with PCP).   I, Delynn Pursley E Nick Stults, PA-C, have reviewed all documentation for this visit. The documentation on 01/12/24 for the exam, diagnosis, procedures, and orders are all accurate and complete.   Jacquelin Hawking, MHS, PA-C [facility]

## 2024-01-12 NOTE — Telephone Encounter (Signed)
Appt today with E. Mecum PA. Same day [facility].

## 2024-01-12 NOTE — Patient Instructions (Signed)
VISIT SUMMARY:  You visited [place] today due to a recent fall that resulted in a clavicle fracture. You are experiencing severe pain and difficulty with daily activities, including sleeping. We discussed your pain management options, functional limitations, and the need for home health and physical therapy services.  YOUR PLAN:  -CLAVICLE FRACTURE: A clavicle fracture is a break in the collarbone, which can cause significant pain and limit your ability to perform daily activities. You should follow up with orthopedics  to consider a better sling or immobilization device.  -PAIN MANAGEMENT: Managing your pain is crucial, especially since you have severe pain and difficulty sleeping. We have prescribed tramadol 50-100 mg up to three times a day, but you should avoid taking it with trazodone, Klonopin, and Lyrica to prevent excessive sedation. Follow up with Dr. Laural Benes next week for further pain management.  -FUNCTIONAL LIMITATIONS AND HOME SAFETY: Due to your clavicle fracture, you are having trouble with daily tasks and are at risk of falling. We have referred you to home health services to assist with daily activities and physical therapy to improve your balance and home safety. Follow up with Dr. Laural Benes to ensure these services are started. If you have not received a phone call to set these services up in the next 3 days please call your PCP office to follow up with this -NUTRITIONAL SUPPORT: You are trying to gain weight but are finding it difficult to cook and maintain a healthy diet due to your injury. We will discuss potential nutritional support and meal assistance with Dr. Laural Benes and consider contacting [employer] for additional support services if needed.  INSTRUCTIONS:  Please follow up with Dr. Laural Benes next week. Ensure that the referrals for home health and physical therapy are processed. Bring this visit summary to Dr. Henriette Combs office for continuity of care.

## 2024-01-14 DIAGNOSIS — C50919 Malignant neoplasm of unspecified site of unspecified female breast: Principal | ICD-10-CM

## 2024-01-16 DIAGNOSIS — C50919 Malignant neoplasm of unspecified site of unspecified female breast: Principal | ICD-10-CM

## 2024-01-17 ENCOUNTER — Telehealth: Payer: Self-pay | Admitting: Family Medicine

## 2024-01-17 DIAGNOSIS — F419 Anxiety disorder, unspecified: Secondary | ICD-10-CM | POA: Diagnosis not present

## 2024-01-17 DIAGNOSIS — I270 Primary pulmonary hypertension: Secondary | ICD-10-CM | POA: Diagnosis not present

## 2024-01-17 DIAGNOSIS — F339 Major depressive disorder, recurrent, unspecified: Secondary | ICD-10-CM | POA: Diagnosis not present

## 2024-01-17 DIAGNOSIS — G629 Polyneuropathy, unspecified: Secondary | ICD-10-CM | POA: Diagnosis not present

## 2024-01-17 DIAGNOSIS — S42001D Fracture of unspecified part of right clavicle, subsequent encounter for fracture with routine healing: Secondary | ICD-10-CM | POA: Diagnosis not present

## 2024-01-17 DIAGNOSIS — I058 Other rheumatic mitral valve diseases: Secondary | ICD-10-CM | POA: Diagnosis not present

## 2024-01-17 DIAGNOSIS — R296 Repeated falls: Secondary | ICD-10-CM | POA: Diagnosis not present

## 2024-01-17 DIAGNOSIS — E039 Hypothyroidism, unspecified: Secondary | ICD-10-CM | POA: Diagnosis not present

## 2024-01-17 DIAGNOSIS — G500 Trigeminal neuralgia: Secondary | ICD-10-CM | POA: Diagnosis not present

## 2024-01-17 NOTE — Telephone Encounter (Signed)
Copied from CRM [id number]. Topic: General - Other >> Jan 17, 2024  2:40 PM Santiya F wrote: Reason for CRM: Home Health Verbal Orders - Caller/Agency: Cristal Deer, PT-[facility] Callback Number: [phone] Service Requested: Physical Therapy Frequency: 1 week 5  Any new concerns about the patient? Yes, Medication interaction between potassium and lomotil and wants a nurse to check on her medication compliance

## 2024-01-18 DIAGNOSIS — C50919 Malignant neoplasm of unspecified site of unspecified female breast: Principal | ICD-10-CM

## 2024-01-18 DIAGNOSIS — S42031S Displaced fracture of lateral end of right clavicle, sequela: Principal | ICD-10-CM

## 2024-01-18 MED ORDER — TRAMADOL 50 MG TABLET
ORAL_TABLET | Freq: Three times a day (TID) | ORAL | 0 refills | 21.00 days | Status: CP | PRN
Start: 2024-01-18 — End: 2024-02-08

## 2024-01-18 NOTE — Telephone Encounter (Signed)
Spoke with Cristal Deer and provided verbal OK orders per Dr Laural Benes. Cristal Deer verbalized understanding.

## 2024-01-18 NOTE — Telephone Encounter (Signed)
OK for verbal orders?

## 2024-01-19 DIAGNOSIS — C50811 Malignant neoplasm of overlapping sites of right female breast: Principal | ICD-10-CM

## 2024-01-19 DIAGNOSIS — S42001D Fracture of unspecified part of right clavicle, subsequent encounter for fracture with routine healing: Principal | ICD-10-CM

## 2024-01-19 DIAGNOSIS — C50919 Malignant neoplasm of unspecified site of unspecified female breast: Principal | ICD-10-CM

## 2024-01-19 DIAGNOSIS — R110 Nausea: Principal | ICD-10-CM

## 2024-01-19 DIAGNOSIS — E876 Hypokalemia: Principal | ICD-10-CM

## 2024-01-19 DIAGNOSIS — G620 Drug-induced polyneuropathy: Secondary | ICD-10-CM | POA: Diagnosis not present

## 2024-01-19 DIAGNOSIS — C7800 Secondary malignant neoplasm of unspecified lung: Secondary | ICD-10-CM | POA: Diagnosis not present

## 2024-01-19 DIAGNOSIS — Z713 Dietary counseling and surveillance: Secondary | ICD-10-CM | POA: Diagnosis not present

## 2024-01-19 DIAGNOSIS — F32A Depression, unspecified: Secondary | ICD-10-CM | POA: Diagnosis not present

## 2024-01-19 DIAGNOSIS — C7931 Secondary malignant neoplasm of brain: Secondary | ICD-10-CM | POA: Diagnosis not present

## 2024-01-19 DIAGNOSIS — T451X5A Adverse effect of antineoplastic and immunosuppressive drugs, initial encounter: Secondary | ICD-10-CM | POA: Diagnosis not present

## 2024-01-19 DIAGNOSIS — Z515 Encounter for palliative care: Secondary | ICD-10-CM | POA: Diagnosis not present

## 2024-01-22 DIAGNOSIS — C50811 Malignant neoplasm of overlapping sites of right female breast: Principal | ICD-10-CM

## 2024-01-22 DIAGNOSIS — R296 Repeated falls: Secondary | ICD-10-CM | POA: Diagnosis not present

## 2024-01-22 DIAGNOSIS — G500 Trigeminal neuralgia: Secondary | ICD-10-CM | POA: Diagnosis not present

## 2024-01-22 DIAGNOSIS — E039 Hypothyroidism, unspecified: Secondary | ICD-10-CM | POA: Diagnosis not present

## 2024-01-22 DIAGNOSIS — F419 Anxiety disorder, unspecified: Secondary | ICD-10-CM | POA: Diagnosis not present

## 2024-01-22 DIAGNOSIS — G629 Polyneuropathy, unspecified: Secondary | ICD-10-CM | POA: Diagnosis not present

## 2024-01-22 DIAGNOSIS — F339 Major depressive disorder, recurrent, unspecified: Secondary | ICD-10-CM | POA: Diagnosis not present

## 2024-01-22 DIAGNOSIS — S42001D Fracture of unspecified part of right clavicle, subsequent encounter for fracture with routine healing: Secondary | ICD-10-CM | POA: Diagnosis not present

## 2024-01-22 DIAGNOSIS — I270 Primary pulmonary hypertension: Secondary | ICD-10-CM | POA: Diagnosis not present

## 2024-01-22 DIAGNOSIS — I058 Other rheumatic mitral valve diseases: Secondary | ICD-10-CM | POA: Diagnosis not present

## 2024-01-26 DIAGNOSIS — C50811 Malignant neoplasm of overlapping sites of right female breast: Principal | ICD-10-CM

## 2024-01-29 ENCOUNTER — Ambulatory Visit
Admit: 2024-01-29 | Discharge: 2024-01-30 | Payer: MEDICARE | Attending: Student in an Organized Health Care Education/Training Program | Primary: Student in an Organized Health Care Education/Training Program

## 2024-01-29 DIAGNOSIS — C50811 Malignant neoplasm of overlapping sites of right female breast: Principal | ICD-10-CM

## 2024-01-29 DIAGNOSIS — R296 Repeated falls: Secondary | ICD-10-CM | POA: Diagnosis not present

## 2024-01-29 DIAGNOSIS — H02886 Meibomian gland dysfunction of left eye, unspecified eyelid: Secondary | ICD-10-CM | POA: Diagnosis not present

## 2024-01-29 DIAGNOSIS — G500 Trigeminal neuralgia: Secondary | ICD-10-CM | POA: Diagnosis not present

## 2024-01-29 DIAGNOSIS — E039 Hypothyroidism, unspecified: Secondary | ICD-10-CM | POA: Diagnosis not present

## 2024-01-29 DIAGNOSIS — H40003 Preglaucoma, unspecified, bilateral: Secondary | ICD-10-CM | POA: Diagnosis not present

## 2024-01-29 DIAGNOSIS — F419 Anxiety disorder, unspecified: Secondary | ICD-10-CM | POA: Diagnosis not present

## 2024-01-29 DIAGNOSIS — I058 Other rheumatic mitral valve diseases: Secondary | ICD-10-CM | POA: Diagnosis not present

## 2024-01-29 DIAGNOSIS — H02883 Meibomian gland dysfunction of right eye, unspecified eyelid: Secondary | ICD-10-CM | POA: Diagnosis not present

## 2024-01-29 DIAGNOSIS — F339 Major depressive disorder, recurrent, unspecified: Secondary | ICD-10-CM | POA: Diagnosis not present

## 2024-01-29 DIAGNOSIS — H2513 Age-related nuclear cataract, bilateral: Secondary | ICD-10-CM | POA: Diagnosis not present

## 2024-01-29 DIAGNOSIS — G629 Polyneuropathy, unspecified: Secondary | ICD-10-CM | POA: Diagnosis not present

## 2024-01-29 DIAGNOSIS — H04123 Dry eye syndrome of bilateral lacrimal glands: Secondary | ICD-10-CM | POA: Diagnosis not present

## 2024-01-29 DIAGNOSIS — I270 Primary pulmonary hypertension: Secondary | ICD-10-CM | POA: Diagnosis not present

## 2024-01-29 DIAGNOSIS — S42001D Fracture of unspecified part of right clavicle, subsequent encounter for fracture with routine healing: Secondary | ICD-10-CM | POA: Diagnosis not present

## 2024-01-29 MED ORDER — DORZOLAMIDE 2 % EYE DROPS
Freq: Three times a day (TID) | OPHTHALMIC | 9 refills | 67.00 days | Status: CP
Start: 2024-01-29 — End: 2025-01-28

## 2024-02-01 DIAGNOSIS — C50811 Malignant neoplasm of overlapping sites of right female breast: Principal | ICD-10-CM

## 2024-02-08 DIAGNOSIS — I058 Other rheumatic mitral valve diseases: Secondary | ICD-10-CM | POA: Diagnosis not present

## 2024-02-08 DIAGNOSIS — G629 Polyneuropathy, unspecified: Secondary | ICD-10-CM | POA: Diagnosis not present

## 2024-02-08 DIAGNOSIS — F419 Anxiety disorder, unspecified: Secondary | ICD-10-CM | POA: Diagnosis not present

## 2024-02-08 DIAGNOSIS — G500 Trigeminal neuralgia: Secondary | ICD-10-CM | POA: Diagnosis not present

## 2024-02-08 DIAGNOSIS — F339 Major depressive disorder, recurrent, unspecified: Secondary | ICD-10-CM | POA: Diagnosis not present

## 2024-02-08 DIAGNOSIS — E039 Hypothyroidism, unspecified: Secondary | ICD-10-CM | POA: Diagnosis not present

## 2024-02-08 DIAGNOSIS — S42001D Fracture of unspecified part of right clavicle, subsequent encounter for fracture with routine healing: Secondary | ICD-10-CM | POA: Diagnosis not present

## 2024-02-08 DIAGNOSIS — I270 Primary pulmonary hypertension: Secondary | ICD-10-CM | POA: Diagnosis not present

## 2024-02-08 DIAGNOSIS — R296 Repeated falls: Secondary | ICD-10-CM | POA: Diagnosis not present

## 2024-02-09 ENCOUNTER — Ambulatory Visit: Admit: 2024-02-09 | Discharge: 2024-02-09 | Payer: MEDICARE

## 2024-02-09 ENCOUNTER — Encounter: Admit: 2024-02-09 | Discharge: 2024-02-09 | Payer: MEDICARE

## 2024-02-09 DIAGNOSIS — C50919 Malignant neoplasm of unspecified site of unspecified female breast: Principal | ICD-10-CM

## 2024-02-09 DIAGNOSIS — C50811 Malignant neoplasm of overlapping sites of right female breast: Principal | ICD-10-CM

## 2024-02-09 DIAGNOSIS — F32A Depression, unspecified: Secondary | ICD-10-CM | POA: Diagnosis not present

## 2024-02-09 DIAGNOSIS — Z79890 Hormone replacement therapy (postmenopausal): Secondary | ICD-10-CM | POA: Diagnosis not present

## 2024-02-09 DIAGNOSIS — C7800 Secondary malignant neoplasm of unspecified lung: Secondary | ICD-10-CM | POA: Diagnosis not present

## 2024-02-09 DIAGNOSIS — J449 Chronic obstructive pulmonary disease, unspecified: Secondary | ICD-10-CM | POA: Diagnosis not present

## 2024-02-09 DIAGNOSIS — Z5112 Encounter for antineoplastic immunotherapy: Secondary | ICD-10-CM | POA: Diagnosis not present

## 2024-02-09 DIAGNOSIS — Z515 Encounter for palliative care: Secondary | ICD-10-CM | POA: Diagnosis not present

## 2024-02-09 DIAGNOSIS — C7931 Secondary malignant neoplasm of brain: Secondary | ICD-10-CM | POA: Diagnosis not present

## 2024-02-09 DIAGNOSIS — G629 Polyneuropathy, unspecified: Secondary | ICD-10-CM | POA: Diagnosis not present

## 2024-02-09 DIAGNOSIS — R630 Anorexia: Secondary | ICD-10-CM | POA: Diagnosis not present

## 2024-02-09 DIAGNOSIS — G4700 Insomnia, unspecified: Secondary | ICD-10-CM | POA: Diagnosis not present

## 2024-02-09 MED ORDER — VENLAFAXINE ER 150 MG CAPSULE,EXTENDED RELEASE 24 HR
ORAL_CAPSULE | Freq: Every day | ORAL | 3 refills | 30.00 days | Status: CP
Start: 2024-02-09 — End: 2024-06-08

## 2024-02-12 DIAGNOSIS — C50811 Malignant neoplasm of overlapping sites of right female breast: Principal | ICD-10-CM

## 2024-02-12 DIAGNOSIS — F339 Major depressive disorder, recurrent, unspecified: Secondary | ICD-10-CM | POA: Diagnosis not present

## 2024-02-12 DIAGNOSIS — I270 Primary pulmonary hypertension: Secondary | ICD-10-CM | POA: Diagnosis not present

## 2024-02-12 DIAGNOSIS — G629 Polyneuropathy, unspecified: Secondary | ICD-10-CM | POA: Diagnosis not present

## 2024-02-12 DIAGNOSIS — I058 Other rheumatic mitral valve diseases: Secondary | ICD-10-CM | POA: Diagnosis not present

## 2024-02-12 DIAGNOSIS — R296 Repeated falls: Secondary | ICD-10-CM | POA: Diagnosis not present

## 2024-02-12 DIAGNOSIS — G500 Trigeminal neuralgia: Secondary | ICD-10-CM | POA: Diagnosis not present

## 2024-02-12 DIAGNOSIS — E039 Hypothyroidism, unspecified: Secondary | ICD-10-CM | POA: Diagnosis not present

## 2024-02-12 DIAGNOSIS — S42001D Fracture of unspecified part of right clavicle, subsequent encounter for fracture with routine healing: Secondary | ICD-10-CM | POA: Diagnosis not present

## 2024-02-12 DIAGNOSIS — F419 Anxiety disorder, unspecified: Secondary | ICD-10-CM | POA: Diagnosis not present

## 2024-02-15 DIAGNOSIS — C50811 Malignant neoplasm of overlapping sites of right female breast: Principal | ICD-10-CM

## 2024-02-16 DIAGNOSIS — C50811 Malignant neoplasm of overlapping sites of right female breast: Principal | ICD-10-CM

## 2024-02-26 ENCOUNTER — Ambulatory Visit: Payer: Self-pay | Admitting: Family Medicine

## 2024-02-28 ENCOUNTER — Ambulatory Visit: Payer: Self-pay | Admitting: Family Medicine

## 2024-02-28 DIAGNOSIS — C50811 Malignant neoplasm of overlapping sites of right female breast: Principal | ICD-10-CM

## 2024-02-28 DIAGNOSIS — G8929 Other chronic pain: Principal | ICD-10-CM

## 2024-02-28 DIAGNOSIS — M25511 Pain in right shoulder: Principal | ICD-10-CM

## 2024-02-28 NOTE — Telephone Encounter (Signed)
 Chief Complaint: back pain Symptoms: back pain, fall Frequency: today at 1100 Pertinent Negatives: Patient denies numbness, tingling, fever, urinary symptoms, head strike, LOC, bruising Disposition: [] ED /[x] Urgent Care (no appt availability in office) / [] Appointment(In office/virtual)/ []  [facility]/ [] Home Care/ [] Refused Recommended Disposition /[] [facility]/ []  Follow-up with PCP Additional Notes: Pt reports she slid down the stairs with groceries today and landed on her back and on the R side of her body. Pt reports a recent fracture to her R sided collarbone and states that area is now more painful. Pt endorses 6/0 middle-lower back pain. Denies bruising, lacerations, gashes that she can see, but pt states she can't see  her back. Pt denies that she hit her head or had a LOC. No blood thinners. No difficulty breathing, no CP, no blurry vision, no dizziness, no headache, no N/V, no urinary symptoms, no numbness or tingling, no difficulty walking. Pt high risk d/t receiving cancer treatment. RN advised pt she should be seen within the next 24 hrs. No availability in office today. Pt is receiving cancer scans all day tomorrow. RN advised pt go to the UC in [place]. Pt states she will text her sister to take her there. RN advised pt if she develops difficulty with walking, numbness/tingling, CP, SOB, to call 911 and pt verbalized understanding.  Additionally, pt would like to ask Dr. Olevia Perches if she can take a clonazepam tomorrow AM before her scans to help with anxiety.   Copied from CRM [id number]. Topic: Clinical - Red Word Triage >> Feb 28, 2024  3:07 PM Yolanda T wrote: Red Word that prompted transfer to Nurse Triage: patient said she fell down steps this morning and hurt the middle lower part of her back. Can't tell if its bruised or not Reason for Disposition  High-risk adult (e.g., history of cancer, HIV, or IV drug use)  Answer Assessment - Initial Assessment  Questions 1. ONSET: "When did the pain begin?"      "Slid down the steps, have steps coming down my apartment, caught it just right and I just slid", fall at 11AM 2. LOCATION: "Where does it hurt?" (upper, mid or lower back)     Middle to lower back 3. SEVERITY: "How bad is the pain?"  (e.g., Scale 1-10; mild, moderate, or severe)   - MILD (1-3): Doesn't interfere with normal activities.    - MODERATE (4-7): Interferes with normal activities or awakens from sleep.    - SEVERE (8-10): Excruciating pain, unable to do any normal activities.      6/10 4. PATTERN: "Is the pain constant?" (e.g., yes, no; constant, intermittent)      Comes and goes, pt states she broke her collarbone about a month ago and that has been aching 5. RADIATION: "Does the pain shoot into your legs or somewhere else?"     "It just goes from the middle of my back down my crack" 6. CAUSE:  "What do you think is causing the back pain?"      Fall 7. BACK OVERUSE:  "Any recent lifting of heavy objects, strenuous work or exercise?"     Fall 8. MEDICINES: "What have you taken so far for the pain?" (e.g., nothing, acetaminophen, NSAIDS)     "I'm not supposed to be taking those (Tylenol, ibuprofen) 9. NEUROLOGIC SYMPTOMS: "Do you have any weakness, numbness, or problems with bowel/bladder control?"     No 10. OTHER SYMPTOMS: "Do you have any other symptoms?" (e.g., fever,  abdomen pain, burning with urination, blood in urine)       No LOC, "I just got up and got my groceries in", no thinners, "I can't see my back to see if it's bruised"  Answer Assessment - Initial Assessment Questions 1. MECHANISM: "How did the fall happen?"     Slid down the stairs 3. ONSET: "When did the fall happen?" (e.g., minutes, hours, or days ago)     11AM 4. LOCATION: "What part of the body hit the ground?" (e.g., back, buttocks, head, hips, knees, hands, head, stomach)     Back 5. INJURY: "Did you hurt (injure) yourself when you fell?" If Yes,  ask: "What did you injure? Tell me more about this?" (e.g., body area; type of injury; pain severity)"     Back pain since, pt states she also broke her collarbone on her R side recently, states that side is the side she landed on and is hurting 6. PAIN: "Is there any pain?" If Yes, ask: "How bad is the pain?" (e.g., Scale 1-10; or mild,  moderate, severe)   - NONE (0): No pain   - MILD (1-3): Doesn't interfere with normal activities    - MODERATE (4-7): Interferes with normal activities or awakens from sleep    - SEVERE (8-10): Excruciating pain, unable to do any normal activities      6/10 7. SIZE: For cuts, bruises, or swelling, ask: "How large is it?" (e.g., inches or centimeters)      Can't see her back 9. OTHER SYMPTOMS: "Do you have any other symptoms?" (e.g., dizziness, fever, weakness; new onset or worsening).      No 10. CAUSE: "What do you think caused the fall (or falling)?" (e.g., tripped, dizzy spell)       Slipped on the stairs  Protocols used: Back Pain-A-AH, Falls and Falling-A-AH

## 2024-02-29 ENCOUNTER — Inpatient Hospital Stay: Admit: 2024-02-29 | Discharge: 2024-03-04 | Payer: MEDICARE

## 2024-02-29 ENCOUNTER — Inpatient Hospital Stay: Admit: 2024-02-29 | Discharge: 2024-03-01 | Payer: MEDICARE

## 2024-03-01 ENCOUNTER — Ambulatory Visit: Admit: 2024-03-01 | Discharge: 2024-03-01 | Payer: MEDICARE

## 2024-03-01 ENCOUNTER — Encounter: Admit: 2024-03-01 | Discharge: 2024-03-01 | Payer: MEDICARE

## 2024-03-01 ENCOUNTER — Telehealth: Payer: Self-pay

## 2024-03-01 DIAGNOSIS — C50919 Malignant neoplasm of unspecified site of unspecified female breast: Principal | ICD-10-CM

## 2024-03-01 DIAGNOSIS — C50811 Malignant neoplasm of overlapping sites of right female breast: Principal | ICD-10-CM

## 2024-03-01 DIAGNOSIS — S00522A Blister (nonthermal) of oral cavity, initial encounter: Principal | ICD-10-CM

## 2024-03-01 DIAGNOSIS — S00522S Blister (nonthermal) of oral cavity, sequela: Principal | ICD-10-CM

## 2024-03-01 DIAGNOSIS — Z720 Tobacco use: Principal | ICD-10-CM

## 2024-03-01 DIAGNOSIS — K1230 Oral mucositis (ulcerative), unspecified: Principal | ICD-10-CM

## 2024-03-01 MED ORDER — SLEEPY BUTTER
Freq: Four times a day (QID) | 0 refills | 8.00 days | Status: CP | PRN
Start: 2024-03-01 — End: ?

## 2024-03-01 NOTE — Telephone Encounter (Signed)
 I got a message on this they need a new order and new notes. We can do this after appt on 3/18- cannot do it sooner. If they'll take verbal orders, OK to give.

## 2024-03-01 NOTE — Telephone Encounter (Signed)
   Copied from CRM [id number]. Topic: Clinical - Home Health Verbal Orders >> Mar 01, 2024  2:01 PM Izetta Dakin wrote: Caller/Agency: [facility] Callback Number: [phone]  Service Requested: Occupational Therapy and Physical Therapy  Frequency: Move start of care date to Monday 3/10/205 Any new concerns about the patient? No

## 2024-03-01 NOTE — Telephone Encounter (Signed)
 Called and notified [facility] of Dr. Henriette Combs message.

## 2024-03-05 ENCOUNTER — Telehealth: Payer: Self-pay | Admitting: Family Medicine

## 2024-03-05 DIAGNOSIS — C50811 Malignant neoplasm of overlapping sites of right female breast: Principal | ICD-10-CM

## 2024-03-05 DIAGNOSIS — G500 Trigeminal neuralgia: Secondary | ICD-10-CM

## 2024-03-05 DIAGNOSIS — F419 Anxiety disorder, unspecified: Secondary | ICD-10-CM

## 2024-03-05 DIAGNOSIS — M4316 Spondylolisthesis, lumbar region: Secondary | ICD-10-CM

## 2024-03-05 DIAGNOSIS — F339 Major depressive disorder, recurrent, unspecified: Secondary | ICD-10-CM

## 2024-03-05 DIAGNOSIS — D500 Iron deficiency anemia secondary to blood loss (chronic): Secondary | ICD-10-CM

## 2024-03-05 DIAGNOSIS — Z7984 Long term (current) use of oral hypoglycemic drugs: Secondary | ICD-10-CM

## 2024-03-05 DIAGNOSIS — I270 Primary pulmonary hypertension: Secondary | ICD-10-CM

## 2024-03-05 DIAGNOSIS — R296 Repeated falls: Secondary | ICD-10-CM

## 2024-03-05 DIAGNOSIS — S42001D Fracture of unspecified part of right clavicle, subsequent encounter for fracture with routine healing: Secondary | ICD-10-CM

## 2024-03-05 DIAGNOSIS — E039 Hypothyroidism, unspecified: Secondary | ICD-10-CM

## 2024-03-05 DIAGNOSIS — I058 Other rheumatic mitral valve diseases: Secondary | ICD-10-CM

## 2024-03-05 DIAGNOSIS — G629 Polyneuropathy, unspecified: Secondary | ICD-10-CM

## 2024-03-05 NOTE — Telephone Encounter (Signed)
 Copied from CRM [id number]. Topic: Referral - Question >> Mar 04, 2024  3:00 PM Barbara Malone wrote: Reason for CRM: Thayer Ohm with [facility] spoke with patient, she is declining physical therapy

## 2024-03-05 NOTE — Telephone Encounter (Signed)
 FYI to provider

## 2024-03-05 NOTE — Telephone Encounter (Signed)
 Noted.

## 2024-03-08 NOTE — Telephone Encounter (Signed)
 error

## 2024-03-12 ENCOUNTER — Ambulatory Visit: Admitting: Family Medicine

## 2024-03-12 ENCOUNTER — Encounter: Payer: Self-pay | Admitting: Family Medicine

## 2024-03-12 VITALS — BP 128/70 | HR 68 | Temp 98.6°F | Resp 18 | Ht 62.01 in | Wt 99.6 lb

## 2024-03-12 DIAGNOSIS — F339 Major depressive disorder, recurrent, unspecified: Secondary | ICD-10-CM | POA: Diagnosis not present

## 2024-03-12 DIAGNOSIS — I10 Essential (primary) hypertension: Secondary | ICD-10-CM | POA: Diagnosis not present

## 2024-03-12 DIAGNOSIS — G500 Trigeminal neuralgia: Secondary | ICD-10-CM

## 2024-03-12 DIAGNOSIS — C50911 Malignant neoplasm of unspecified site of right female breast: Secondary | ICD-10-CM

## 2024-03-12 DIAGNOSIS — E782 Mixed hyperlipidemia: Secondary | ICD-10-CM

## 2024-03-12 DIAGNOSIS — C7801 Secondary malignant neoplasm of right lung: Secondary | ICD-10-CM

## 2024-03-12 DIAGNOSIS — E039 Hypothyroidism, unspecified: Secondary | ICD-10-CM | POA: Diagnosis not present

## 2024-03-12 DIAGNOSIS — C7931 Secondary malignant neoplasm of brain: Secondary | ICD-10-CM

## 2024-03-12 DIAGNOSIS — D692 Other nonthrombocytopenic purpura: Secondary | ICD-10-CM

## 2024-03-12 DIAGNOSIS — Z114 Encounter for screening for human immunodeficiency virus [HIV]: Secondary | ICD-10-CM

## 2024-03-12 DIAGNOSIS — F419 Anxiety disorder, unspecified: Secondary | ICD-10-CM

## 2024-03-12 DIAGNOSIS — Z0000 Encounter for general adult medical examination without abnormal findings: Secondary | ICD-10-CM

## 2024-03-12 DIAGNOSIS — C7802 Secondary malignant neoplasm of left lung: Secondary | ICD-10-CM

## 2024-03-12 DIAGNOSIS — G620 Drug-induced polyneuropathy: Secondary | ICD-10-CM

## 2024-03-12 LAB — MICROALBUMIN, URINE WAIVED
Creatinine, Urine Waived: 50 mg/dL (ref 10–300)
Microalb, Ur Waived: 80 mg/L — ABNORMAL HIGH (ref 0–19)

## 2024-03-12 MED ORDER — CLONAZEPAM 1 MG PO TABS: 1.0000 mg | ORAL_TABLET | Freq: Two times a day (BID) | ORAL | 2 refills | Status: AC

## 2024-03-12 MED ORDER — CARBAMAZEPINE ER 100 MG PO CP12: 100.0000 mg | ORAL_CAPSULE | Freq: Two times a day (BID) | ORAL | 1 refills | Status: DC

## 2024-03-12 MED ORDER — POTASSIUM CHLORIDE CRYS ER 20 MEQ PO TBCR: 20.0000 meq | EXTENDED_RELEASE_TABLET | Freq: Four times a day (QID) | ORAL | 6 refills | Status: DC

## 2024-03-12 NOTE — Assessment & Plan Note (Signed)
 Rechecking labs today. Await results. Treat as needed.

## 2024-03-12 NOTE — Assessment & Plan Note (Signed)
 Reassured patient. Continue to monitor.

## 2024-03-12 NOTE — Assessment & Plan Note (Signed)
 Stable. Continue to monitor. Call with any concerns.  ?

## 2024-03-12 NOTE — Assessment & Plan Note (Signed)
Doing well off medicine. Continue to monitor. Call with any concerns.  

## 2024-03-12 NOTE — Progress Notes (Signed)
 BP 128/70 (BP Location: Left Arm, Patient Position: Sitting, Cuff Size: Small)   Pulse 68   Temp 98.6 F (37 C) (Oral)   Resp 18   Ht 5' 2.01" (1.575 m)   Wt 99 lb 9.6 oz (45.2 kg)   SpO2 98%   BMI 18.21 kg/m    Subjective:    Patient ID: Barbara Malone, female    DOB: 10/04/1956, 68 y.o.   MRN: [id number]  HPI: Barbara Malone is a 68 y.o. female presenting on 03/12/2024 for comprehensive medical examination. Current medical complaints include:  HYPERTENSION / HYPERLIPIDEMIA Satisfied with current treatment? yes Duration of hypertension: chronic BP monitoring frequency: not checking BP medication side effects: no Duration of hyperlipidemia: chronic Cholesterol medication side effects: N/A Cholesterol supplements: none Past cholesterol medications: none Medication compliance: N/A Aspirin: no Recent stressors: no Recurrent headaches: no Visual changes: no Palpitations: no Dyspnea: no Chest pain: no Lower extremity edema: no Dizzy/lightheaded: no  ANXIETY/DEPRESSION Duration: Chronic Status: stable Anxious mood: yes  Excessive worrying: no Irritability: no  Sweating: no Nausea: no Palpitations:no Hyperventilation: no Panic attacks: no Agoraphobia: no  Obscessions/compulsions: no Depressed mood: no    03/12/2024   10:56 AM 07/31/2023    1:13 PM 06/06/2023    9:34 AM 04/27/2023    1:24 PM 03/08/2023   10:15 AM  Depression screen PHQ 2/9  Decreased Interest 1 2 3 3 3   Down, Depressed, Hopeless 2 2 2 3 1   PHQ - 2 Score 3 4 5 6 4   Altered sleeping 1 1 2 3 3   Tired, decreased energy 1 1 3 3 2   Change in appetite 2 3 3 3 3   Feeling bad or failure about yourself  1 1 3 3  0  Trouble concentrating 1 3 3 3  0  Moving slowly or fidgety/restless 0 1 3 3  0  Suicidal thoughts 0 0 1 1 0  PHQ-9 Score 9 14 23 25 12   Difficult doing work/chores Not difficult at all Somewhat difficult  Extremely dIfficult Somewhat difficult   Anhedonia: no Weight changes: no Insomnia: no    Hypersomnia: no Fatigue/loss of energy: yes Feelings of worthlessness: no Feelings of guilt: no Impaired concentration/indecisiveness: no Suicidal ideations: no  Crying spells: no Recent Stressors/Life Changes: yes   Relationship problems: no   Family stress: no     Financial stress: no    Job stress: no    Recent death/loss: no  HYPOTHYROIDISM Thyroid control status:stable Satisfied with current treatment? yes Medication side effects: no Medication compliance: excellent compliance Recent dose adjustment:no Fatigue: yes Cold intolerance: no Heat intolerance: no Weight gain: no Weight loss: no Constipation: no Diarrhea/loose stools: no Palpitations: no Lower extremity edema: no Anxiety/depressed mood: yes   She currently lives with: alone Menopausal Symptoms: no  Depression Screen done today and results listed below:     03/12/2024   10:56 AM 07/31/2023    1:13 PM 06/06/2023    9:34 AM 04/27/2023    1:24 PM 03/08/2023   10:15 AM  Depression screen PHQ 2/9  Decreased Interest 1 2 3 3 3   Down, Depressed, Hopeless 2 2 2 3 1   PHQ - 2 Score 3 4 5 6 4   Altered sleeping 1 1 2 3 3   Tired, decreased energy 1 1 3 3 2   Change in appetite 2 3 3 3 3   Feeling bad or failure about yourself  1 1 3 3  0  Trouble concentrating 1 3 3 3  0  Moving slowly or  fidgety/restless 0 1 3 3  0  Suicidal thoughts 0 0 1 1 0  PHQ-9 Score 9 14 23 25 12   Difficult doing work/chores Not difficult at all Somewhat difficult  Extremely dIfficult Somewhat difficult    Past Medical History:  Past Medical History:  Diagnosis Date   Anxiety    Brain tumor (HCC)    Cancer (HCC)    breast, metastatic, active   Depression    still active; takes meds for depression;    Hyperlipidemia    controllled with medication;    Metastatic breast cancer    Peripheral neuropathy     Surgical History:  Past Surgical History:  Procedure Laterality Date   ABDOMINAL HYSTERECTOMY  1984   BILATERAL TOTAL  MASTECTOMY WITH AXILLARY LYMPH NODE DISSECTION     BRAIN SURGERY  2019   CARPAL TUNNEL RELEASE     CESAREAN SECTION     IR BONE TUMOR(S)RF ABLATION  04/06/2023   IR KYPHO THORACIC WITH BONE BIOPSY  04/06/2023   IR RADIOLOGIST EVAL & MGMT  03/28/2023   TRIGGER FINGER RELEASE      Medications:  Current Outpatient Medications on File Prior to Visit  Medication Sig   acetic acid-hydrocortisone (VOSOL-HC) OTIC solution Place 5 drops into the right ear 3 (three) times daily.   albuterol (VENTOLIN HFA) 108 (90 Base) MCG/ACT inhaler Inhale 2 puffs into the lungs every 6 (six) hours as needed for wheezing or shortness of breath.   anastrozole (ARIMIDEX) 1 MG tablet Take 1 mg by mouth daily.   butalbital-acetaminophen-caffeine (FIORICET) 50-325-40 MG tablet Take 1 tablet by mouth every 6 (six) hours as needed for headache.   clobetasol ointment (TEMOVATE) 0.05 % Apply 1 Application topically 2 (two) times daily.   dapagliflozin propanediol (FARXIGA) 5 MG TABS tablet Take 5 mg by mouth daily.   diclofenac (VOLTAREN) 50 MG EC tablet Take 50 mg by mouth 2 (two) times daily.   diclofenac Sodium (VOLTAREN) 1 % GEL Apply 2 g topically 4 (four) times daily.   diphenoxylate-atropine (LOMOTIL) 2.5-0.025 MG tablet Take by mouth 4 (four) times daily as needed for diarrhea or loose stools.   dorzolamide (TRUSOPT) 2 % ophthalmic solution 1 drop 3 (three) times daily.   esomeprazole (NEXIUM) 40 MG capsule Take 40 mg by mouth daily at 12 noon.   famotidine (PEPCID) 20 MG tablet Take 20 mg by mouth daily.   levothyroxine (SYNTHROID) 50 MCG tablet Take 1 tablet (50 mcg total) by mouth daily.   magic mouthwash (lidocaine, diphenhydrAMINE, alum & mag hydroxide) suspension 5 mLs.   Magnesium 500 MG CAPS Take 1 capsule (500 mg total) by mouth daily.   ondansetron (ZOFRAN) 4 MG tablet Take 4 mg by mouth every 8 (eight) hours as needed for nausea or vomiting.   pregabalin (LYRICA) 200 MG capsule Take 200 mg by mouth 2 (two)  times daily.   prochlorperazine (COMPAZINE) 10 MG tablet Take 10 mg by mouth every 6 (six) hours as needed for nausea or vomiting.   senna (SENOKOT) 8.6 MG TABS tablet Take 1 tablet by mouth.   thiamine 50 MG tablet Take 50 mg by mouth daily.   traZODone (DESYREL) 50 MG tablet Take 50 mg by mouth at bedtime.   venlafaxine XR (EFFEXOR-XR) 75 MG 24 hr capsule Take 1 capsule (75 mg total) by mouth daily with breakfast.   No current facility-administered medications on file prior to visit.    Allergies:  Allergies  Allergen Reactions   Buprenorphine-Naloxone Other (  See Comments)   Varenicline Other (See Comments)    Bad dreams   Oxycodone Itching   Tape Dermatitis   Tetracyclines & Related Other (See Comments)   Vicodin [Hydrocodone-Acetaminophen] Itching   Decadron [Dexamethasone] Anxiety    Mania    Social History:  Social History   Socioeconomic History   Marital status: Divorced    Spouse name: Not on file   Number of children: Not on file   Years of education: Not on file   Highest education level: Not on file  Occupational History   Not on file  Tobacco Use   Smoking status: Every Day    Current packs/day: 1.00    Types: Cigarettes   Smokeless tobacco: Never  Substance and Sexual Activity   Alcohol use: Yes    Alcohol/week: 20.0 standard drinks of alcohol    Types: 20 Glasses of wine per week   Drug use: No   Sexual activity: Not on file  Other Topics Concern   Not on file  Social History Narrative   Not on file   Social Drivers of Health   Financial Resource Strain: Low Risk  (03/12/2024)   Overall Financial Resource Strain (CARDIA)    Difficulty of Paying Living Expenses: Not very hard  Food Insecurity: Food Insecurity Present (12/28/2021)   [facility]   Hunger Vital Sign    Worried About Running Out of Food in the Last Year: Sometimes true    Ran Out of Food in the Last Year: Sometimes true  Transportation Needs: No Transportation  Needs (03/12/2024)   PRAPARE - Administrator, Civil Service (Medical): No    Lack of Transportation (Non-Medical): No  Physical Activity: Insufficiently Active (03/12/2024)   Exercise Vital Sign    Days of Exercise per Week: 2 days    Minutes of Exercise per Session: 20 min  Stress: Stress Concern Present (03/12/2024)   [employer] of Occupational Health - Occupational Stress Questionnaire    Feeling of Stress : Very much  Social Connections: Socially Isolated (03/12/2024)   Social Connection and Isolation Panel [NHANES]    Frequency of Communication with Friends and Family: Never    Frequency of Social Gatherings with Friends and Family: Once a week    Attends Religious Services: Never    Database administrator or Organizations: No    Attends Engineer, structural: More than 4 times per year    Marital Status: Divorced  Catering manager Violence: Not At Risk (03/12/2024)   Humiliation, Afraid, Rape, and Kick questionnaire    Fear of Current or Ex-Partner: No    Emotionally Abused: No    Physically Abused: No    Sexually Abused: No   Social History   Tobacco Use  Smoking Status Every Day   Current packs/day: 1.00   Types: Cigarettes  Smokeless Tobacco Never   Social History   Substance and Sexual Activity  Alcohol Use Yes   Alcohol/week: 20.0 standard drinks of alcohol   Types: 20 Glasses of wine per week    Family History:  Family History  Problem Relation Age of Onset   Prostate cancer Father    Prostate cancer Maternal Grandfather    Cancer Other    Breast cancer Paternal Aunt    Lung cancer Maternal Uncle     Past medical history, surgical history, medications, allergies, family history and social history reviewed with patient today and changes made to appropriate areas of the chart.  Review of Systems  Constitutional: Negative.   HENT:  Positive for ear pain (R ear pain). Negative for congestion, ear discharge, hearing loss,  nosebleeds, sinus pain, sore throat and tinnitus.   Eyes:  Positive for blurred vision. Negative for double vision, photophobia, pain, discharge and redness.  Respiratory: Negative.  Negative for stridor.   Cardiovascular: Negative.   Gastrointestinal:  Positive for heartburn. Negative for abdominal pain, blood in stool, constipation, diarrhea, melena, nausea and vomiting.  Genitourinary: Negative.   Musculoskeletal: Negative.   Skin: Negative.   Neurological:  Positive for tingling. Negative for dizziness, tremors, sensory change, speech change, focal weakness, seizures, loss of consciousness, weakness and headaches.  Endo/Heme/Allergies:  Negative for environmental allergies and polydipsia. Bruises/bleeds easily.  Psychiatric/Behavioral: Negative.     All other ROS negative except what is listed above and in the HPI.      Objective:    BP 128/70 (BP Location: Left Arm, Patient Position: Sitting, Cuff Size: Small)   Pulse 68   Temp 98.6 F (37 C) (Oral)   Resp 18   Ht 5' 2.01" (1.575 m)   Wt 99 lb 9.6 oz (45.2 kg)   SpO2 98%   BMI 18.21 kg/m   Wt Readings from Last 3 Encounters:  03/12/24 99 lb 9.6 oz (45.2 kg)  01/12/24 100 lb (45.4 kg)  11/29/23 106 lb (48.1 kg)    Physical Exam Vitals and nursing note reviewed.  Constitutional:      General: She is not in acute distress.    Appearance: Normal appearance. She is well-developed. She is not ill-appearing, toxic-appearing or diaphoretic.  HENT:     Head: Normocephalic and atraumatic.     Right Ear: Tympanic membrane, ear canal and external ear normal. There is no impacted cerumen.     Left Ear: Tympanic membrane, ear canal and external ear normal. There is no impacted cerumen.     Nose: Nose normal. No congestion or rhinorrhea.     Mouth/Throat:     Mouth: Mucous membranes are moist.     Pharynx: Oropharynx is clear. No oropharyngeal exudate or posterior oropharyngeal erythema.  Eyes:     General: No scleral icterus.        Right eye: No discharge.        Left eye: No discharge.     Extraocular Movements: Extraocular movements intact.     Conjunctiva/sclera: Conjunctivae normal.     Pupils: Pupils are equal, round, and reactive to light.  Neck:     Vascular: No carotid bruit.  Cardiovascular:     Rate and Rhythm: Normal rate and regular rhythm.     Pulses: Normal pulses.     Heart sounds: No murmur heard.    No friction rub. No gallop.  Pulmonary:     Effort: Pulmonary effort is normal. No respiratory distress.     Breath sounds: Normal breath sounds. No stridor. No wheezing, rhonchi or rales.  Chest:     Chest wall: No tenderness.  Abdominal:     General: Abdomen is flat. Bowel sounds are normal. There is no distension.     Palpations: Abdomen is soft. There is no mass.     Tenderness: There is no abdominal tenderness. There is no right CVA tenderness, left CVA tenderness, guarding or rebound.     Hernia: No hernia is present.  Genitourinary:    Comments: Breast and pelvic exams deferred with shared decision making Musculoskeletal:        General: No swelling,  tenderness, deformity or signs of injury.     Cervical back: Normal range of motion and neck supple. No rigidity. No muscular tenderness.     Right lower leg: No edema.     Left lower leg: No edema.  Lymphadenopathy:     Cervical: No cervical adenopathy.  Skin:    General: Skin is warm and dry.     Capillary Refill: Capillary refill takes less than 2 seconds.     Coloration: Skin is not jaundiced or pale.     Findings: No bruising, erythema, lesion or rash.  Neurological:     General: No focal deficit present.     Mental Status: She is alert and oriented to person, place, and time. Mental status is at baseline.     Cranial Nerves: No cranial nerve deficit.     Sensory: No sensory deficit.     Motor: No weakness.     Coordination: Coordination normal.     Gait: Gait normal.     Deep Tendon Reflexes: Reflexes normal.  Psychiatric:         Mood and Affect: Mood normal.        Behavior: Behavior normal.        Thought Content: Thought content normal.        Judgment: Judgment normal.     Results for orders placed or performed in visit on 07/31/23  HM DEXA SCAN   Collection Time: 11/01/22 12:00 AM  Result Value Ref Range   HM Dexa Scan see report scanned into chart       Assessment & Plan:   Problem List Items Addressed This Visit       Cardiovascular and Mediastinum   Hypertension   Doing well off medicine. Continue to monitor. Call with any concerns.       Relevant Orders   CBC with Differential/Platelet   Comprehensive metabolic panel   Microalbumin, Urine Waived   Senile purpura (HCC)   Reassured patient. Continue to monitor.         Respiratory   Malignant neoplasm metastatic to both lungs [facility])   Continue to follow with oncology. Call with any concerns. Continue to monitor.         Endocrine   Acquired hypothyroidism   Rechecking labs today. Await results. Treat as needed.       Relevant Orders   CBC with Differential/Platelet   Comprehensive metabolic panel   TSH     Nervous and Auditory   Cancer of right breast metastatic to brain [facility])   Continue to follow with oncology. Call with any concerns. Continue to monitor.       Relevant Orders   CBC with Differential/Platelet   Comprehensive metabolic panel   Drug-induced polyneuropathy (HCC)   Stable. Continue to monitor. Call with any concerns.       Relevant Medications   carbamazepine (CARBATROL) 100 MG 12 hr capsule   clonazePAM (KLONOPIN) 1 MG tablet   venlafaxine XR (EFFEXOR-XR) 75 MG 24 hr capsule   Trigeminal neuralgia   Under good control on current regimen. Continue current regimen. Continue to monitor. Call with any concerns. Refills given. Labs drawn today.        Relevant Medications   carbamazepine (CARBATROL) 100 MG 12 hr capsule   clonazePAM (KLONOPIN) 1 MG tablet   venlafaxine XR (EFFEXOR-XR) 75 MG 24 hr  capsule   Other Relevant Orders   Carbamazepine Level (Tegretol), total     Other   Anxiety  Under good control on current regimen. Continue current regimen. Continue to monitor. Call with any concerns. Refills given for 3 months. Follow up 3 months.        Relevant Medications   venlafaxine XR (EFFEXOR-XR) 75 MG 24 hr capsule   Depression, recurrent (HCC)   Under good control on current regimen. Continue current regimen. Continue to monitor. Call with any concerns. Refills given.        Relevant Medications   venlafaxine XR (EFFEXOR-XR) 75 MG 24 hr capsule   Other Relevant Orders   CBC with Differential/Platelet   Comprehensive metabolic panel   Mixed hyperlipidemia   Rechecking labs today. Await results. Treat as needed.       Relevant Orders   Lipid Panel w/o Chol/HDL Ratio   CBC with Differential/Platelet   Comprehensive metabolic panel   Lipid Profile   Other Visit Diagnoses       Encounter for Medicare annual wellness exam    -  Primary   Preventative care discussed today.     Routine general medical examination at a health care facility       Vaccines up to date/declined. Screening labs checked today. Pap/Mammo N/A. Colonoscopy and DEXA up to date. Continue to monitor. Call with any concerns.     Screening for HIV without presence of risk factors       Labs drawn today. Await results.   Relevant Orders   HIV Antibody (routine testing w rflx)        Follow up plan: Return in 3 months (on 06/12/2024) for virtual OK.   LABORATORY TESTING:  - Pap smear: not applicable  IMMUNIZATIONS:   - Tdap: Tetanus vaccination status reviewed: Declined. - Influenza: Up to date - Pneumovax: Up to date - Prevnar: Up to date - COVID: Up to date - HPV: Not applicable - Shingrix vaccine: Refused  SCREENING: -Mammogram: Not applicable  - Colonoscopy: Up to date  - Bone Density: Up to date  -Hearing Test: Up to date   PATIENT COUNSELING:   Advised to take 1 mg of  folate supplement per day if capable of pregnancy.   Sexuality: Discussed sexually transmitted diseases, partner selection, use of condoms, avoidance of unintended pregnancy  and contraceptive alternatives.   Advised to avoid cigarette smoking.  I discussed with the patient that most people either abstain from alcohol or drink within safe limits (<=14/week and <=4 drinks/occasion for males, <=7/weeks and <= 3 drinks/occasion for females) and that the risk for alcohol disorders and other health effects rises proportionally with the number of drinks per week and how often a drinker exceeds daily limits.  Discussed cessation/primary prevention of drug use and availability of treatment for abuse.   Diet: Encouraged to adjust caloric intake to maintain  or achieve ideal body weight, to reduce intake of dietary saturated fat and total fat, to limit sodium intake by avoiding high sodium foods and not adding table salt, and to maintain adequate dietary potassium and calcium preferably from fresh fruits, vegetables, and low-fat dairy products.    stressed the importance of regular exercise  Injury prevention: Discussed safety belts, safety helmets, smoke detector, smoking near bedding or upholstery.   Dental health: Discussed importance of regular tooth brushing, flossing, and dental visits.    NEXT PREVENTATIVE PHYSICAL DUE IN 1 YEAR. Return in 3 months (on 06/12/2024) for virtual OK.

## 2024-03-12 NOTE — Assessment & Plan Note (Signed)
 Under good control on current regimen. Continue current regimen. Continue to monitor. Call with any concerns. Refills given. Labs drawn today.

## 2024-03-12 NOTE — Progress Notes (Signed)
 Hearing Screen Results  20 dB HL   Right Ear Left Ear  500 Hz Pass []  Fail [x]  Pass [x]  Fail []   1,000 Hz Pass [x]  Fail []  Pass [x]  Fail []   2,000 Hz Pass []  Fail [x]  Pass [x]  Fail []   4,000 Hz Pass []  Fail [x]  Pass [x]  Fail []     25 dB HL   Right Ear Left Ear  500 Hz Pass []  Fail [x]  Pass [x]  Fail []   1,000 Hz Pass []  Fail [x]  Pass [x]  Fail []   2,000 Hz Pass []  Fail [x]  Pass [x]  Fail []   4,000 Hz Pass []  Fail [x]  Pass [x]  Fail []     40 dB HL   Right Ear Left Ear  500 Hz Pass []  Fail [x]  Pass [x]  Fail []   1,000 Hz Pass [x]  Fail []  Pass [x]  Fail []   2,000 Hz Pass []  Fail [x]  Pass [x]  Fail []   4,000 Hz Pass []  Fail [x]  Pass [x]  Fail []    Comments:

## 2024-03-12 NOTE — Assessment & Plan Note (Signed)
 Under good control on current regimen. Continue current regimen. Continue to monitor. Call with any concerns. Refills given.

## 2024-03-12 NOTE — Assessment & Plan Note (Signed)
 Under good control on current regimen. Continue current regimen. Continue to monitor. Call with any concerns. Refills given for 3 months. Follow up 3 months.

## 2024-03-12 NOTE — Patient Instructions (Signed)
  Barbara Malone , Thank you for taking time to come for your Medicare Wellness Visit. I appreciate your ongoing commitment to your health goals. Please review the following plan we discussed and let me know if I can assist you in the future.   These are the goals we discussed:  Goals   None     This is a list of the screening recommended for you and due dates:  Health Maintenance  Topic Date Due   HIV Screening  Never done   DTaP/Tdap/Td vaccine (1 - Tdap) Never done   Zoster (Shingles) Vaccine (1 of 2) Never done   COVID-19 Vaccine (5 - 2024-25 season) 03/28/2024*   Medicare Annual Wellness Visit  03/12/2025   Colon Cancer Screening  10/21/2026   Pneumonia Vaccine  Completed   Flu Shot  Completed   DEXA scan (bone density measurement)  Completed   Hepatitis C Screening  Completed   HPV Vaccine  Aged Out   Screening for Lung Cancer  Discontinued  *Topic was postponed. The date shown is not the original due date.

## 2024-03-12 NOTE — Assessment & Plan Note (Signed)
 Continue to follow with oncology. Call with any concerns. Continue to monitor.

## 2024-03-12 NOTE — Progress Notes (Signed)
 Subjective:   Barbara Malone is a 68 y.o. female who presents for Medicare Annual (Subsequent) preventive examination.  Visit Complete: In person  Patient Medicare AWV questionnaire was completed by the patient on 03/12/24; I have confirmed that all information answered by patient is correct and no changes since this date.  Cardiac Risk Factors include: advanced age (>73men, >31 women);sedentary lifestyle;smoking/ tobacco exposure;Other (see comment) (Cancer)     Objective:    Today's Vitals   03/12/24 1049  BP: 128/70  Pulse: 68  Resp: 18  Temp: 98.6 F (37 C)  TempSrc: Oral  SpO2: 98%  Weight: 99 lb 9.6 oz (45.2 kg)  Height: 5' 2.01" (1.575 m)  PainSc: 0-No pain   Body mass index is 18.21 kg/m.     03/12/2024   10:54 AM 08/10/2020    1:27 PM 12/10/2018    1:26 AM 11/30/2017    9:10 AM 09/16/2017    3:22 PM 07/13/2017    5:30 PM 05/11/2017   12:49 AM  Advanced Directives  Does Patient Have a Medical Advance Directive? Yes Yes Yes No No No No  Type of Estate agent of [employer] Power of [place];Living will --      Does patient want to make changes to medical advance directive? No - Patient declined Yes (Inpatient - patient defers changing a medical advance directive and declines information at this time) No - Patient declined      Copy of Healthcare Power of Attorney in Chart? No - copy requested No - copy requested       Would patient like information on creating a medical advance directive?    Yes (MAU/Ambulatory/Procedural Areas - Information given) No - Patient declined      Current Medications (verified) Outpatient Encounter Medications as of 03/12/2024  Medication Sig   acetic acid-hydrocortisone (VOSOL-HC) OTIC solution Place 5 drops into the right ear 3 (three) times daily.   albuterol (VENTOLIN HFA) 108 (90 Base) MCG/ACT inhaler Inhale 2 puffs into the lungs every 6 (six) hours as needed for wheezing or shortness of breath.    anastrozole (ARIMIDEX) 1 MG tablet Take 1 mg by mouth daily.   butalbital-acetaminophen-caffeine (FIORICET) 50-325-40 MG tablet Take 1 tablet by mouth every 6 (six) hours as needed for headache.   carbamazepine (CARBATROL) 100 MG 12 hr capsule Take 1 capsule (100 mg total) by mouth 2 (two) times daily.   clobetasol ointment (TEMOVATE) 0.05 % Apply 1 Application topically 2 (two) times daily.   clonazePAM (KLONOPIN) 1 MG tablet Take 1 tablet (1 mg total) by mouth 2 (two) times daily.   dapagliflozin propanediol (FARXIGA) 5 MG TABS tablet Take 5 mg by mouth daily.   diclofenac (VOLTAREN) 50 MG EC tablet Take 50 mg by mouth 2 (two) times daily.   diclofenac Sodium (VOLTAREN) 1 % GEL Apply 2 g topically 4 (four) times daily.   diphenoxylate-atropine (LOMOTIL) 2.5-0.025 MG tablet Take by mouth 4 (four) times daily as needed for diarrhea or loose stools.   dorzolamide (TRUSOPT) 2 % ophthalmic solution 1 drop 3 (three) times daily.   esomeprazole (NEXIUM) 40 MG capsule Take 40 mg by mouth daily at 12 noon.   famotidine (PEPCID) 20 MG tablet Take 20 mg by mouth daily.   levothyroxine (SYNTHROID) 50 MCG tablet Take 1 tablet (50 mcg total) by mouth daily.   magic mouthwash (lidocaine, diphenhydrAMINE, alum & mag hydroxide) suspension 5 mLs.   Magnesium 500 MG CAPS Take 1 capsule (500 mg  total) by mouth daily.   ondansetron (ZOFRAN) 4 MG tablet Take 4 mg by mouth every 8 (eight) hours as needed for nausea or vomiting.   potassium chloride SA (KLOR-CON M) 20 MEQ tablet Take 1 tablet (20 mEq total) by mouth 4 (four) times daily.   pregabalin (LYRICA) 200 MG capsule Take 200 mg by mouth 2 (two) times daily.   prochlorperazine (COMPAZINE) 10 MG tablet Take 10 mg by mouth every 6 (six) hours as needed for nausea or vomiting.   senna (SENOKOT) 8.6 MG TABS tablet Take 1 tablet by mouth.   thiamine 50 MG tablet Take 50 mg by mouth daily.   traZODone (DESYREL) 50 MG tablet Take 50 mg by mouth at bedtime.    venlafaxine XR (EFFEXOR-XR) 150 MG 24 hr capsule Take 150 mg by mouth daily with breakfast.   No facility-administered encounter medications on file as of 03/12/2024.    Allergies (verified) Buprenorphine-naloxone, Varenicline, Oxycodone, Tape, Tetracyclines & related, Vicodin [hydrocodone-acetaminophen], and Decadron [dexamethasone]   History: Past Medical History:  Diagnosis Date   Anxiety    Brain tumor (HCC)    Cancer (HCC)    breast, metastatic, active   Depression    still active; takes meds for depression;    Hyperlipidemia    controllled with medication;    Metastatic breast cancer    Peripheral neuropathy    Past Surgical History:  Procedure Laterality Date   ABDOMINAL HYSTERECTOMY  1984   BILATERAL TOTAL MASTECTOMY WITH AXILLARY LYMPH NODE DISSECTION     BRAIN SURGERY  2019   CARPAL TUNNEL RELEASE     CESAREAN SECTION     IR BONE TUMOR(S)RF ABLATION  04/06/2023   IR KYPHO THORACIC WITH BONE BIOPSY  04/06/2023   IR RADIOLOGIST EVAL & MGMT  03/28/2023   TRIGGER FINGER RELEASE     Family History  Problem Relation Age of Onset   Prostate cancer Father    Prostate cancer Maternal Grandfather    Cancer Other    Breast cancer Paternal Aunt    Lung cancer Maternal Uncle    Social History   Socioeconomic History   Marital status: Divorced    Spouse name: Not on file   Number of children: Not on file   Years of education: Not on file   Highest education level: Not on file  Occupational History   Not on file  Tobacco Use   Smoking status: Every Day    Current packs/day: 1.00    Types: Cigarettes   Smokeless tobacco: Never  Substance and Sexual Activity   Alcohol use: Yes    Alcohol/week: 20.0 standard drinks of alcohol    Types: 20 Glasses of wine per week   Drug use: No   Sexual activity: Not on file  Other Topics Concern   Not on file  Social History Narrative   Not on file   Social Drivers of Health   Financial Resource Strain: Low Risk  (03/12/2024)    Overall Financial Resource Strain (CARDIA)    Difficulty of Paying Living Expenses: Not very hard  Food Insecurity: Food Insecurity Present (12/28/2021)   [facility]   Hunger Vital Sign    Worried About Running Out of Food in the Last Year: Sometimes true    Ran Out of Food in the Last Year: Sometimes true  Transportation Needs: No Transportation Needs (03/12/2024)   PRAPARE - Transportation    Lack of Transportation (Medical): No    Lack of  Transportation (Non-Medical): No  Physical Activity: Insufficiently Active (03/12/2024)   Exercise Vital Sign    Days of Exercise per Week: 2 days    Minutes of Exercise per Session: 20 min  Stress: Stress Concern Present (03/12/2024)   [employer] of Occupational Health - Occupational Stress Questionnaire    Feeling of Stress : Very much  Social Connections: Socially Isolated (03/12/2024)   Social Connection and Isolation Panel [NHANES]    Frequency of Communication with Friends and Family: Never    Frequency of Social Gatherings with Friends and Family: Once a week    Attends Religious Services: Never    Database administrator or Organizations: No    Attends Engineer, structural: More than 4 times per year    Marital Status: Divorced    Tobacco Counseling Ready to quit: Not Answered Counseling given: Not Answered   Clinical Intake:     Pain : No/denies pain Pain Score: 0-No pain     BMI - recorded: 18.21 Nutritional Status: BMI <19  Underweight Nutritional Risks: Other (Comment) (Weight loss due to cancer and not being able to eat.) Diabetes: No  How often do you need to have someone help you when you read instructions, pamphlets, or other written materials from your doctor or pharmacy?: 1 - Never  Interpreter Needed?: No      Activities of Daily Living    03/12/2024   10:51 AM  In your present state of health, do you have any difficulty performing the following activities:  Hearing? 1   Vision? 1  Comment Better since the eye doctor.  Difficulty concentrating or making decisions? 1  Walking or climbing stairs? 1  Comment Climbing  Dressing or bathing? 0  Comment has troubles with buttons.  Doing errands, shopping? 0  Preparing Food and eating ? Y  Comment Doesn't like cooking for herself.  Using the Toilet? N  In the past six months, have you accidently leaked urine? N  Do you have problems with loss of bowel control? N  Managing your Medications? N  Managing your Finances? N  Housekeeping or managing your Housekeeping? N    Patient Care Team: Dorcas Carrow, DO as PCP - General (Family Medicine)  Indicate any recent Medical Services you may have received from other than [facility] providers in the past year (date may be approximate).     Assessment:   This is a routine wellness examination for Barbara Malone.  Hearing/Vision screen Vision Screening   Right eye Left eye Both eyes  Without correction 20/50 20/50 20/50   With correction        Goals Addressed   None   Depression Screen    03/12/2024   10:56 AM 07/31/2023    1:13 PM 06/06/2023    9:34 AM 04/27/2023    1:24 PM 03/08/2023   10:15 AM  PHQ 2/9 Scores  PHQ - 2 Score 3 4 5 6 4   PHQ- 9 Score 9 14 23 25 12     Fall Risk    03/12/2024   10:55 AM 07/31/2023    1:13 PM 06/06/2023    9:33 AM 04/27/2023    1:24 PM 03/08/2023   11:02 AM  Fall Risk   Falls in the past year? 1 0 1 1 0  Number falls in past yr: 0 0 0 1 0  Injury with Fall? 0 0 0 0 0  Risk for fall due to : No Fall Risks No Fall Risks History of fall(s)  History of fall(s) History of fall(s)  Follow up Falls evaluation completed Falls evaluation completed Falls evaluation completed Falls evaluation completed Falls evaluation completed    MEDICARE RISK AT HOME: Medicare Risk at Home Any stairs in or around the home?: No If so, are there any without handrails?: No Home free of loose throw rugs in walkways, pet beds, electrical cords, etc?:  No Adequate lighting in your home to reduce risk of falls?: No Life alert?: Yes Use of a cane, walker or w/c?: No Grab bars in the bathroom?: Yes Shower chair or bench in shower?: Yes Elevated toilet seat or a handicapped toilet?: No  TIMED UP AND GO:  Was the test performed?  Yes  Length of time to ambulate 10 feet: 3 sec Gait steady and fast without use of assistive device    Cognitive Function:    03/12/2024   11:00 AM  MMSE - Mini Mental State Exam  Orientation to time 5  Orientation to Place 5  Registration 1  Attention/ Calculation 3  Recall 1  Language- name 2 objects 2  Language- repeat 1  Language- follow 3 step command 3  Language- read & follow direction 1  Write a sentence 1  Copy design 0  Total score 23        03/08/2023   11:03 AM  6CIT Screen  What Year? 0 points  What month? 0 points  What time? 0 points  Count back from 20 0 points  Months in reverse 4 points  Repeat phrase 4 points  Total Score 8 points    Immunizations Immunization History  Administered Date(s) Administered   Fluad Quad(high Dose 65+) 07/27/2023   Influenza,inj,Quad PF,6+ Mos 02/25/2016, 09/28/2017, 09/23/2018, 08/29/2019, 09/04/2020, 11/25/2022   Influenza,inj,quad, With Preservative 10/13/2016   Influenza-Unspecified 11/01/2021, 10/26/2022   Moderna Covid-19 Fall Seasonal Vaccine 4yrs & older 12/16/2022   Moderna Sars-Covid-2 Vaccination 03/28/2020, 04/25/2020, 09/14/2020   PNEUMOCOCCAL CONJUGATE-20 07/31/2023   Pneumococcal Polysaccharide-23 09/28/2017   Rsv, Bivalent, Protein Subunit Rsvpref,pf Verdis Frederickson) 11/25/2022    Tdap: Declined  Flu Vaccine status: Up to date  Pneumococcal vaccine status: Up to date  Covid-19 vaccine status: Completed vaccines  Qualifies for Shingles Vaccine? Yes   Zostavax completed No - needs to get it at her pharmacy due to insurance    Screening Tests Health Maintenance  Topic Date Due   HIV Screening  Never done    DTaP/Tdap/Td (1 - Tdap) Never done   Zoster Vaccines- Shingrix (1 of 2) Never done   COVID-19 Vaccine (5 - 2024-25 season) 03/28/2024 (Originally 08/27/2023)   Medicare Annual Wellness (AWV)  03/12/2025   Colonoscopy  10/21/2026   Pneumonia Vaccine 11+ Years old  Completed   INFLUENZA VACCINE  Completed   DEXA SCAN  Completed   Hepatitis C Screening  Completed   HPV VACCINES  Aged Out   Lung Cancer Screening  Discontinued    Health Maintenance  Health Maintenance Due  Topic Date Due   HIV Screening  Never done   DTaP/Tdap/Td (1 - Tdap) Never done   Zoster Vaccines- Shingrix (1 of 2) Never done    Colorectal cancer screening: Type of screening: Colonoscopy. Completed 10/21/16. Repeat every 10 years  Mammogram status: No longer required due to mastectomies.  Bone Density status: Completed 11/01/22. Results reflect: Bone density results: OSTEOPENIA. Repeat every 3 years.  Lung Cancer Screening: (Low Dose CT Chest recommended if Age 59-80 years, 20 pack-year currently smoking OR have quit w/in 15years.) does not  qualify.   Additional Screening:  Hepatitis C Screening: does qualify; Completed 04/06/21  Vision Screening: Recommended annual ophthalmology exams for early detection of glaucoma and other disorders of the eye. Is the patient up to date with their annual eye exam?  Yes   Dental Screening: Recommended annual dental exams for proper oral hygiene   Community Resource Referral / Chronic Care Management: CRR required this visit?  No   CCM required this visit?  No     Plan:     I have personally reviewed and noted the following in the patient's chart:   Medical and social history Use of alcohol, tobacco or illicit drugs  Current medications and supplements including opioid prescriptions. Patient is not currently taking opioid prescriptions. Functional ability and status Nutritional status Physical activity Advanced directives List of other  physicians Hospitalizations, surgeries, and ER visits in previous 12 months Vitals Screenings to include cognitive, depression, and falls Referrals and appointments  In addition, I have reviewed and discussed with patient certain preventive protocols, quality metrics, and best practice recommendations. A written personalized care plan for preventive services as well as general preventive health recommendations were provided to patient.     Olevia Perches, DO   03/12/2024   After Visit Summary: (In Person-Printed) AVS printed and given to the patient  Nurse Notes: N/A

## 2024-03-15 ENCOUNTER — Ambulatory Visit: Admit: 2024-03-15 | Discharge: 2024-03-16 | Payer: MEDICARE

## 2024-03-15 DIAGNOSIS — E785 Hyperlipidemia, unspecified: Principal | ICD-10-CM

## 2024-03-15 DIAGNOSIS — F5101 Primary insomnia: Principal | ICD-10-CM

## 2024-03-15 DIAGNOSIS — Z8673 Personal history of transient ischemic attack (TIA), and cerebral infarction without residual deficits: Principal | ICD-10-CM

## 2024-03-15 DIAGNOSIS — D151 Benign neoplasm of heart: Principal | ICD-10-CM

## 2024-03-15 DIAGNOSIS — C50919 Malignant neoplasm of unspecified site of unspecified female breast: Principal | ICD-10-CM

## 2024-03-15 MED ORDER — TRAZODONE 50 MG TABLET
ORAL_TABLET | Freq: Every evening | ORAL | 0 refills | 0.00 days | PRN
Start: 2024-03-15 — End: ?

## 2024-03-15 MED ORDER — FARXIGA 5 MG TABLET
ORAL_TABLET | Freq: Every day | ORAL | 3 refills | 90.00 days | Status: CP
Start: 2024-03-15 — End: ?

## 2024-03-19 DIAGNOSIS — F5101 Primary insomnia: Principal | ICD-10-CM

## 2024-03-19 MED ORDER — TRAZODONE 50 MG TABLET
ORAL_TABLET | Freq: Every evening | ORAL | 1 refills | 30 days | Status: CP | PRN
Start: 2024-03-19 — End: ?

## 2024-03-22 ENCOUNTER — Encounter: Admit: 2024-03-22 | Discharge: 2024-03-22

## 2024-03-22 ENCOUNTER — Ambulatory Visit: Admit: 2024-03-22 | Discharge: 2024-03-22

## 2024-03-22 DIAGNOSIS — C50919 Malignant neoplasm of unspecified site of unspecified female breast: Principal | ICD-10-CM

## 2024-03-22 DIAGNOSIS — C50811 Malignant neoplasm of overlapping sites of right female breast: Principal | ICD-10-CM

## 2024-03-22 DIAGNOSIS — R110 Nausea: Principal | ICD-10-CM

## 2024-03-22 MED ORDER — ESOMEPRAZOLE MAGNESIUM 40 MG CAPSULE,DELAYED RELEASE
ORAL_CAPSULE | Freq: Two times a day (BID) | ORAL | 2 refills | 30 days | Status: CP
Start: 2024-03-22 — End: ?

## 2024-03-22 MED ORDER — DICLOFENAC 1 % TOPICAL GEL
Freq: Four times a day (QID) | TOPICAL | 1 refills | 19 days | Status: CP | PRN
Start: 2024-03-22 — End: ?

## 2024-03-26 DIAGNOSIS — C50811 Malignant neoplasm of overlapping sites of right female breast: Principal | ICD-10-CM

## 2024-04-02 DIAGNOSIS — C50811 Malignant neoplasm of overlapping sites of right female breast: Principal | ICD-10-CM

## 2024-04-05 DIAGNOSIS — C50811 Malignant neoplasm of overlapping sites of right female breast: Principal | ICD-10-CM

## 2024-04-08 ENCOUNTER — Telehealth: Payer: Self-pay | Admitting: Family Medicine

## 2024-04-08 NOTE — Telephone Encounter (Signed)
 1st attempt-  Tried to reach patient however had to leave a message. If she returns call please find out where she was sent for UC so we can obtain those records.

## 2024-04-08 NOTE — Telephone Encounter (Signed)
 Copied from CRM [id number]. Topic: Clinical - Medication Question >> Apr 08, 2024  8:39 AM Jeris Montes S wrote: Reason for CRM: Patient called to see if PCP can prescribe meds for a UTI/Yeast infection. She wen to the Urgent care but forgot to ask for medicine to be prescribed

## 2024-04-08 NOTE — Telephone Encounter (Signed)
 Copied from CRM [id number]. Topic: Clinical - Medication Question >> Apr 08, 2024  8:39 AM Jeris Montes S wrote: Reason for CRM: Patient called to see if PCP can prescribe meds for a UTI/Yeast infection. She wen to the Urgent care but forgot to ask for medicine to be prescribed >> Apr 08, 2024 11:00 AM Rosamond Comes wrote: Patient returning missed call. Patient was seen at the [facility].

## 2024-04-08 NOTE — Telephone Encounter (Signed)
 Please find out where she went for UC- if we can get records I can treat, otherwise it'd be best if I see her

## 2024-04-10 DIAGNOSIS — C50811 Malignant neoplasm of overlapping sites of right female breast: Principal | ICD-10-CM

## 2024-04-10 NOTE — Telephone Encounter (Signed)
 Have been unable to obtain records at this point from the urgent care where she was seen. If she still requires treatment she will need to either ensure he records from that visit are sent to us  or she will need to schedule a provider visit.

## 2024-04-11 ENCOUNTER — Ambulatory Visit: Payer: Self-pay | Admitting: *Deleted

## 2024-04-11 DIAGNOSIS — Z515 Encounter for palliative care: Principal | ICD-10-CM

## 2024-04-11 DIAGNOSIS — C50811 Malignant neoplasm of overlapping sites of right female breast: Principal | ICD-10-CM

## 2024-04-11 NOTE — Telephone Encounter (Signed)
 Caller was checking on the status of message below. Informed caller of CMA note and she stated she will call back with the name of the urgent care and medication prescribed that possible caused patient to have a UTI.

## 2024-04-11 NOTE — Telephone Encounter (Signed)
 Please see other thread for background.   Without records from urgent care visit we have been unable to assist patient. I have tried multiple times to reach however only ever able to leave messages. If she or family return the call please assist in scheduling an appointment. We can not treat without her being seen at this point.   FYI we also have no release on file to share any information with sister Adan Adas. Please do not release/schedule with her as that needs to be done with patient or a DPR listed on her record.

## 2024-04-11 NOTE — Telephone Encounter (Signed)
 Patient was seen at [facility] for UTI (no appointment available last week with PCP), Patient sister is calling with medication request: Chief Complaint: Patient is presently taking: cephalexin (500 mg capsule) and now has yeast infection Symptoms: itching, irritation from yeast- pain from UTI has resolved Frequency: still taking antibiotic- has yeast Pertinent Negatives: Patient denies fever Disposition: [] ED /[] Urgent Care (no appt availability in office) / [] Appointment(In office/virtual)/ []  [place] [facility]/ [] Home Care/ [] Refused Recommended Disposition /[] [place] [facility]/ [x]  Follow-up with PCP Additional Notes: Patient is requesting treatment for yeast due to antibiotic. [facility]: [facility] Offered appointment- tomorrow- but patient would like medication today if possible.  Copied from CRM [id number]. Topic: Clinical - Red Word Triage >> Apr 11, 2024  9:20 AM Marissa P wrote: Red Word that prompted transfer to Nurse Triage: Sister of patient called patient is currently in pain from uti. She also called to give info in regards to recent telephone communication note: urgent care name: [facility], she stated on [address] in [place]. Also, the med prescribed name is: cephalexin (500 mg capsule) along with an over the counter urinary tablet she's taking. Warm connected over to nurse. Reason for Disposition . [1] Reasonable improvement on antibiotics AND [2] no fever  Answer Assessment - Initial Assessment Questions 1. MAIN SYMPTOM: "What is the main symptom you are concerned about?" (e.g., painful urination, urine frequency)     Yeast symptoms from antibiotic 2. BETTER-SAME-WORSE: "Are you getting better, staying the same, or getting worse compared to how you felt at your last visit to the doctor (most recent medical visit)?"     Improving UTI symptoms  3. PAIN: "How bad is the pain?"  (e.g., Scale 1-10; mild, moderate, or severe)   - MILD (1-3): complains slightly about  urination hurting   - MODERATE (4-7): interferes with normal activities     - SEVERE (8-10): excruciating, unwilling or unable to urinate because of the pain      None- better 4. FEVER: "Do you have a fever?" If Yes, ask: "What is it, how was it measured, and when did it start?"     no 5. OTHER SYMPTOMS: "Do you have any other symptoms?" (e.g., blood in the urine, flank pain, vaginal discharge)     itching 6. DIAGNOSIS: "When was the UTI diagnosed?" "By whom?" "Was it a kidney infection, bladder infection or both?"     [facility] last Thursday 4/10 7. ANTIBIOTIC: "What antibiotic(s) are you taking?" "How many times per day?"     cephalexin (500 mg capsule)  8. ANTIBIOTIC - START DATE: "When did you start taking the antibiotic?"     4/10- still taking  Protocols used: Urinary Tract Infection on Antibiotic Follow-up Call - [facility]

## 2024-04-12 ENCOUNTER — Ambulatory Visit: Admit: 2024-04-12 | Discharge: 2024-04-12 | Payer: MEDICARE | Attending: Medical Oncology | Primary: Medical Oncology

## 2024-04-12 ENCOUNTER — Ambulatory Visit: Admit: 2024-04-12 | Discharge: 2024-04-12 | Payer: MEDICARE

## 2024-04-12 ENCOUNTER — Encounter: Admit: 2024-04-12 | Discharge: 2024-04-12 | Payer: MEDICARE

## 2024-04-12 DIAGNOSIS — C50811 Malignant neoplasm of overlapping sites of right female breast: Principal | ICD-10-CM

## 2024-04-12 DIAGNOSIS — C50919 Malignant neoplasm of unspecified site of unspecified female breast: Principal | ICD-10-CM

## 2024-04-12 DIAGNOSIS — R110 Nausea: Principal | ICD-10-CM

## 2024-04-12 DIAGNOSIS — K900 Celiac disease: Principal | ICD-10-CM

## 2024-04-12 DIAGNOSIS — Z1159 Encounter for screening for other viral diseases: Principal | ICD-10-CM

## 2024-04-12 DIAGNOSIS — E039 Hypothyroidism, unspecified: Principal | ICD-10-CM

## 2024-04-12 DIAGNOSIS — R7989 Other specified abnormal findings of blood chemistry: Principal | ICD-10-CM

## 2024-04-12 MED ORDER — FLUCONAZOLE 150 MG TABLET
ORAL_TABLET | Freq: Once | ORAL | 0 refills | 2.00 days | Status: CP
Start: 2024-04-12 — End: 2024-04-12

## 2024-04-12 NOTE — Telephone Encounter (Signed)
Please assist patient with appointment scheduling.

## 2024-04-12 NOTE — Telephone Encounter (Signed)
 Copied from CRM [id number]. Topic: Clinical - Red Word Triage >> Apr 11, 2024  9:20 AM Marissa P wrote: Red Word that prompted transfer to Nurse Triage: Sister of patient called patient is currently in pain from uti. She also called to give info in regards to recent telephone communication note: urgent care name: [facility], she stated on [address] in [place]. Also, the med prescribed name is: cephalexin (500 mg capsule) along with an over the counter urinary tablet she's taking. Warm connected over to nurse. >> Apr 12, 2024  9:17 AM Emylou G wrote: Adding to message.. Call Edwina Gram for any update

## 2024-04-15 ENCOUNTER — Ambulatory Visit: Admitting: Family Medicine

## 2024-04-15 DIAGNOSIS — C50811 Malignant neoplasm of overlapping sites of right female breast: Principal | ICD-10-CM

## 2024-04-15 DIAGNOSIS — C50919 Malignant neoplasm of unspecified site of unspecified female breast: Principal | ICD-10-CM

## 2024-04-17 ENCOUNTER — Ambulatory Visit: Payer: Self-pay

## 2024-04-17 NOTE — Telephone Encounter (Signed)
  Chief Complaint: Mouth and throat pain Symptoms: white areas to tongue and back of throat Frequency: patient thinks symptoms started yesterday Pertinent Negatives: Patient denies fever Disposition: [] ED /[] Urgent Care (no appt availability in office) / [] Appointment(In office/virtual)/ []  [place] [facility]/ [] Home Care/ [] Refused Recommended Disposition /[] [place] [facility]/ [x]  Follow-up with PCP Additional Notes: patient with cancer calling with concerns for possible thrush. Patient reports that her tongue and back of throat have "white stuff " and her tongue is sore. Patient states her tongue is only sore when she smokes "Im not going to stop smoking. My cancer is too far along." Patient reports having a bottle of Nystatin rinse for her previous rounds of thrush. Patient states bottle isn't out of date. Patient is requesting recommendations for possible thrush. Patient isn't wanting to have to be seen in office. Patient is asking for a phone call from office.    Copied from CRM [id number]. Topic: Clinical - Red Word Triage >> Apr 17, 2024  1:19 PM Oddis Bench wrote: Red Word that prompted transfer to Nurse Triage: Patient is calling bc she is stating that she thinks she has a yeast in her mouth, she is stating that she has half of her tongue and the back of her throat has white stuff on it and her tongue is sore. She is a  cancer patient. Reason for Disposition  Weak immune system (e.g., HIV positive, cancer chemo, splenectomy, organ transplant, chronic steroids)  Answer Assessment - Initial Assessment Questions 1. SYMPTOM: "What's the main symptom you're concerned about?" (e.g., chapped lips, dry mouth, lump, sores)     Possible yeast to half of tongue and back of throat. Patient states she has had thrush before and states she feels like symptoms are in line with thrush 2. ONSET: "When did the  white spots to tongue and back of throat  start?"     Patient states she felt it coming on  yesterday 3. PAIN: "Is there any pain?" If Yes, ask: "How bad is it?" (Scale: 1-10; mild, moderate, severe)   - MILD (1-3):  doesn't interfere with eating or normal activities   - MODERATE (4-7): interferes with eating some solids and normal activities   - SEVERE (8-10):  excruciating pain, interferes with most normal activities   - SEVERE DYSPHAGIA: can't swallow liquids, drooling     Patient states that if she doesn't smoke she doesn't have pain 4. CAUSE: "What do you think is causing the symptoms?"     thrush 5. OTHER SYMPTOMS: "Do you have any other symptoms?" (e.g., fever, sore throat, toothache, swelling)     Slight sore throat  Protocols used: Mouth Symptoms-A-AH

## 2024-04-18 DIAGNOSIS — C50919 Malignant neoplasm of unspecified site of unspecified female breast: Principal | ICD-10-CM

## 2024-04-18 DIAGNOSIS — C50811 Malignant neoplasm of overlapping sites of right female breast: Principal | ICD-10-CM

## 2024-04-18 NOTE — Telephone Encounter (Signed)
 OK to book virtual appt for tomorrow so I can look at her mouth.

## 2024-04-29 NOTE — Telephone Encounter (Signed)
 Called pt back and left message for her to call office to schedule appt.

## 2024-05-03 ENCOUNTER — Ambulatory Visit: Admit: 2024-05-03 | Discharge: 2024-05-03 | Payer: Medicare (Managed Care)

## 2024-05-03 ENCOUNTER — Encounter: Admit: 2024-05-03 | Discharge: 2024-05-03 | Payer: Medicare (Managed Care)

## 2024-05-03 DIAGNOSIS — R799 Abnormal finding of blood chemistry, unspecified: Principal | ICD-10-CM

## 2024-05-03 DIAGNOSIS — G620 Drug-induced polyneuropathy: Principal | ICD-10-CM

## 2024-05-03 DIAGNOSIS — K219 Gastro-esophageal reflux disease without esophagitis: Principal | ICD-10-CM

## 2024-05-03 DIAGNOSIS — E039 Hypothyroidism, unspecified: Principal | ICD-10-CM

## 2024-05-03 DIAGNOSIS — C50919 Malignant neoplasm of unspecified site of unspecified female breast: Principal | ICD-10-CM

## 2024-05-03 DIAGNOSIS — C50811 Malignant neoplasm of overlapping sites of right female breast: Principal | ICD-10-CM

## 2024-05-03 DIAGNOSIS — T451X5A Adverse effect of antineoplastic and immunosuppressive drugs, initial encounter: Principal | ICD-10-CM

## 2024-05-03 MED ORDER — POTASSIUM CHLORIDE ER 20 MEQ TABLET,EXTENDED RELEASE(PART/CRYST)
ORAL_TABLET | Freq: Three times a day (TID) | ORAL | 0 refills | 100.00000 days | Status: CP
Start: 2024-05-03 — End: ?

## 2024-05-03 MED ORDER — ESOMEPRAZOLE MAGNESIUM 40 MG CAPSULE,DELAYED RELEASE
ORAL_CAPSULE | Freq: Two times a day (BID) | ORAL | 2 refills | 30.00000 days | Status: CP
Start: 2024-05-03 — End: ?

## 2024-05-03 MED ORDER — PREGABALIN 200 MG CAPSULE
ORAL_CAPSULE | Freq: Two times a day (BID) | ORAL | 3 refills | 30.00000 days | Status: CP
Start: 2024-05-03 — End: ?

## 2024-05-03 MED ORDER — FLUCONAZOLE 150 MG TABLET
ORAL_TABLET | Freq: Once | ORAL | 0 refills | 2.00000 days | Status: CP
Start: 2024-05-03 — End: 2024-05-03

## 2024-05-06 DIAGNOSIS — C50811 Malignant neoplasm of overlapping sites of right female breast: Principal | ICD-10-CM

## 2024-05-06 DIAGNOSIS — E039 Hypothyroidism, unspecified: Principal | ICD-10-CM

## 2024-05-06 DIAGNOSIS — C50919 Malignant neoplasm of unspecified site of unspecified female breast: Principal | ICD-10-CM

## 2024-05-09 DIAGNOSIS — C50919 Malignant neoplasm of unspecified site of unspecified female breast: Principal | ICD-10-CM

## 2024-05-09 DIAGNOSIS — C50811 Malignant neoplasm of overlapping sites of right female breast: Principal | ICD-10-CM

## 2024-05-09 DIAGNOSIS — E039 Hypothyroidism, unspecified: Principal | ICD-10-CM

## 2024-05-09 DIAGNOSIS — R197 Diarrhea, unspecified: Principal | ICD-10-CM

## 2024-05-09 MED ORDER — POTASSIUM CHLORIDE ER 10 MEQ TABLET,EXTENDED RELEASE(PART/CRYST)
ORAL_TABLET | Freq: Three times a day (TID) | ORAL | 3 refills | 90.00000 days | Status: CP
Start: 2024-05-09 — End: ?

## 2024-05-09 MED ORDER — DIPHENOXYLATE-ATROPINE 2.5 MG-0.025 MG TABLET
ORAL_TABLET | Freq: Four times a day (QID) | ORAL | 1 refills | 8.00000 days | Status: CP | PRN
Start: 2024-05-09 — End: ?

## 2024-05-10 DIAGNOSIS — E039 Hypothyroidism, unspecified: Principal | ICD-10-CM

## 2024-05-10 DIAGNOSIS — C50919 Malignant neoplasm of unspecified site of unspecified female breast: Principal | ICD-10-CM

## 2024-05-10 DIAGNOSIS — C50811 Malignant neoplasm of overlapping sites of right female breast: Principal | ICD-10-CM

## 2024-05-13 DIAGNOSIS — F109 Alcohol use disorder: Principal | ICD-10-CM

## 2024-05-13 DIAGNOSIS — C50919 Malignant neoplasm of unspecified site of unspecified female breast: Principal | ICD-10-CM

## 2024-05-13 DIAGNOSIS — R634 Abnormal weight loss: Principal | ICD-10-CM

## 2024-05-13 DIAGNOSIS — C50811 Malignant neoplasm of overlapping sites of right female breast: Principal | ICD-10-CM

## 2024-05-13 DIAGNOSIS — E039 Hypothyroidism, unspecified: Principal | ICD-10-CM

## 2024-05-16 DIAGNOSIS — E039 Hypothyroidism, unspecified: Principal | ICD-10-CM

## 2024-05-16 DIAGNOSIS — C50811 Malignant neoplasm of overlapping sites of right female breast: Principal | ICD-10-CM

## 2024-05-16 DIAGNOSIS — C50919 Malignant neoplasm of unspecified site of unspecified female breast: Principal | ICD-10-CM

## 2024-05-22 DIAGNOSIS — E039 Hypothyroidism, unspecified: Principal | ICD-10-CM

## 2024-05-22 DIAGNOSIS — C50919 Malignant neoplasm of unspecified site of unspecified female breast: Principal | ICD-10-CM

## 2024-05-22 DIAGNOSIS — C50811 Malignant neoplasm of overlapping sites of right female breast: Principal | ICD-10-CM

## 2024-05-24 ENCOUNTER — Ambulatory Visit: Admit: 2024-05-24 | Discharge: 2024-05-24 | Payer: Medicare (Managed Care)

## 2024-05-24 ENCOUNTER — Encounter: Admit: 2024-05-24 | Discharge: 2024-05-24 | Payer: Medicare (Managed Care)

## 2024-05-24 ENCOUNTER — Ambulatory Visit
Admit: 2024-05-24 | Discharge: 2024-05-24 | Payer: Medicare (Managed Care) | Attending: Medical Oncology | Primary: Medical Oncology

## 2024-05-24 DIAGNOSIS — C50919 Malignant neoplasm of unspecified site of unspecified female breast: Principal | ICD-10-CM

## 2024-05-24 DIAGNOSIS — C50811 Malignant neoplasm of overlapping sites of right female breast: Principal | ICD-10-CM

## 2024-05-24 DIAGNOSIS — Z1159 Encounter for screening for other viral diseases: Principal | ICD-10-CM

## 2024-05-24 DIAGNOSIS — E039 Hypothyroidism, unspecified: Principal | ICD-10-CM

## 2024-05-24 DIAGNOSIS — K900 Celiac disease: Principal | ICD-10-CM

## 2024-05-24 DIAGNOSIS — R110 Nausea: Principal | ICD-10-CM

## 2024-05-24 DIAGNOSIS — R7989 Other specified abnormal findings of blood chemistry: Principal | ICD-10-CM

## 2024-05-24 LAB — CBC AND DIFFERENTIAL
HCT: 38 (ref 36–46)
Hemoglobin: 13.2 (ref 12.0–16.0)
Platelets: 93 10*3/uL — AB (ref 150–400)
WBC: 4.2

## 2024-05-24 LAB — COMPREHENSIVE METABOLIC PANEL WITH GFR
Albumin: 2.5 — AB (ref 3.5–5.0)
Calcium: 8.1 — AB (ref 8.7–10.7)

## 2024-05-24 LAB — BASIC METABOLIC PANEL WITH GFR
BUN: 5 (ref 4–21)
CO2: 24 — AB (ref 13–22)
Chloride: 111 — AB (ref 99–108)
Creatinine: 0.5 (ref 0.5–1.1)
Glucose: 111
Potassium: 4.6 meq/L (ref 3.5–5.1)
Sodium: 145 (ref 137–147)

## 2024-05-24 LAB — HEPATIC FUNCTION PANEL
ALT: 41 U/L — AB (ref 7–35)
AST: 88 — AB (ref 13–35)
Alkaline Phosphatase: 326 — AB (ref 25–125)
Bilirubin, Total: 0.9

## 2024-05-24 LAB — TSH: TSH: 2.78 (ref 0.41–5.90)

## 2024-05-26 DIAGNOSIS — C50811 Malignant neoplasm of overlapping sites of right female breast: Principal | ICD-10-CM

## 2024-05-26 DIAGNOSIS — C50919 Malignant neoplasm of unspecified site of unspecified female breast: Principal | ICD-10-CM

## 2024-05-27 ENCOUNTER — Encounter: Admit: 2024-05-27 | Discharge: 2024-05-28 | Payer: Medicare (Managed Care)

## 2024-05-27 DIAGNOSIS — C50811 Malignant neoplasm of overlapping sites of right female breast: Principal | ICD-10-CM

## 2024-05-27 DIAGNOSIS — C50919 Malignant neoplasm of unspecified site of unspecified female breast: Principal | ICD-10-CM

## 2024-05-27 DIAGNOSIS — F109 Alcohol use: Principal | ICD-10-CM

## 2024-05-27 DIAGNOSIS — Z515 Encounter for palliative care: Principal | ICD-10-CM

## 2024-05-27 DIAGNOSIS — R630 Anorexia: Principal | ICD-10-CM

## 2024-05-27 MED ORDER — OLANZAPINE 5 MG TABLET
ORAL_TABLET | Freq: Every evening | ORAL | 2 refills | 30.00000 days | Status: CP
Start: 2024-05-27 — End: ?

## 2024-05-28 DIAGNOSIS — C50919 Malignant neoplasm of unspecified site of unspecified female breast: Principal | ICD-10-CM

## 2024-05-28 DIAGNOSIS — C50811 Malignant neoplasm of overlapping sites of right female breast: Principal | ICD-10-CM

## 2024-05-30 DIAGNOSIS — C50919 Malignant neoplasm of unspecified site of unspecified female breast: Principal | ICD-10-CM

## 2024-05-30 DIAGNOSIS — C50811 Malignant neoplasm of overlapping sites of right female breast: Principal | ICD-10-CM

## 2024-05-30 DIAGNOSIS — T451X5A Adverse effect of antineoplastic and immunosuppressive drugs, initial encounter: Principal | ICD-10-CM

## 2024-05-30 DIAGNOSIS — G620 Drug-induced polyneuropathy: Principal | ICD-10-CM

## 2024-05-30 MED ORDER — PREGABALIN 200 MG CAPSULE
ORAL_CAPSULE | Freq: Three times a day (TID) | ORAL | 3 refills | 30.00000 days | Status: CP
Start: 2024-05-30 — End: ?

## 2024-05-31 DIAGNOSIS — C50919 Malignant neoplasm of unspecified site of unspecified female breast: Principal | ICD-10-CM

## 2024-05-31 DIAGNOSIS — C50811 Malignant neoplasm of overlapping sites of right female breast: Principal | ICD-10-CM

## 2024-05-31 NOTE — Progress Notes (Signed)
 Referring Physician:  Karolee Paci, NP [address] [place],  [place] [address]  Primary Physician:  Solomon Dupre, DO  History of Present Illness: Barbara Malone has a history of breast CA with mets to brain and lungs. She is seeing oncology and palliative care.    Also with history of HTN, hyperlipidemia, drug induced peripheral neuropathy, mitral valve mass, and pulmonary HTN.    Last seen by on 06/20/23. She did well after T11 kyphoplasty  on 04/06/23. She had increased LBP that improved after 2 rounds of prednisone.   She is here for follow up.   She had a fall on 01/11/24 and fractured her clavicle. Since that time, she has constant mid back pain. No significant radiation to her ribs. No pain in her arms or legs. No numbness, tingling, or weakness in her arms/legs. Pain is better with heat and worse with standing, bending.   This pain feels like her last compression fracture.   She take prn ultram , lyrica, voltaren.   Bowel/Bladder Dysfunction: none  She smokes 3/4ppd x 40 years.    Conservative measures:  Physical therapy: none for her back, did PT for her clavicle this year Multimodal medical therapy including regular antiinflammatories: voltaren, voltaren gel, norco 10, lidoderm, lyrica, prednisone Injections: No epidural steroid injections      Past Surgery:  T11 kyphoplasty on 04/06/23   The symptoms are causing a significant impact on the patient's life.   Review of Systems:  A 10 point review of systems is negative, except for the pertinent positives and negatives detailed in the HPI.  Past Medical History: Past Medical History:  Diagnosis Date   Anxiety    Brain tumor (HCC)    Cancer (HCC)    breast, metastatic, active   Depression    still active; takes meds for depression;    Hyperlipidemia    controllled with medication;    Metastatic breast cancer    Peripheral neuropathy     Past Surgical History: Past Surgical  History:  Procedure Laterality Date   ABDOMINAL HYSTERECTOMY  1984   BILATERAL TOTAL MASTECTOMY WITH AXILLARY LYMPH NODE DISSECTION     BRAIN SURGERY  2019   CARPAL TUNNEL RELEASE     CESAREAN SECTION     IR BONE TUMOR(S)RF ABLATION  04/06/2023   IR KYPHO THORACIC WITH BONE BIOPSY  04/06/2023   IR RADIOLOGIST EVAL & MGMT  03/28/2023   TRIGGER FINGER RELEASE      Allergies: Allergies as of 06/06/2024 - Review Complete 06/06/2024  Allergen Reaction Noted   Buprenorphine-naloxone Other (See Comments) 09/03/2021   Varenicline Other (See Comments) 07/18/2022   Oxycodone  Itching 05/11/2017   Tape Dermatitis 05/11/2017   Tetracyclines & related Other (See Comments) 05/11/2017   Vicodin [hydrocodone-acetaminophen ] Itching 05/11/2017   Decadron [dexamethasone] Anxiety 03/28/2023    Medications: Outpatient Encounter Medications as of 06/06/2024  Medication Sig   acetic acid -hydrocortisone  (VOSOL -HC) OTIC solution Place 5 drops into the right ear 3 (three) times daily.   albuterol  (VENTOLIN  HFA) 108 (90 Base) MCG/ACT inhaler Inhale 2 puffs into the lungs every 6 (six) hours as needed for wheezing or shortness of breath.   anastrozole (ARIMIDEX) 1 MG tablet Take 1 mg by mouth daily.   butalbital -acetaminophen -caffeine  (FIORICET) 50-325-40 MG tablet Take 1 tablet by mouth every 6 (six) hours as needed for headache.   carbamazepine  (CARBATROL ) 100 MG 12 hr capsule Take 1 capsule (100 mg total) by mouth 2 (two) times  daily.   clobetasol ointment (TEMOVATE) 0.05 % Apply 1 Application topically 2 (two) times daily.   clonazePAM  (KLONOPIN ) 1 MG tablet Take 1 tablet (1 mg total) by mouth 2 (two) times daily.   dapagliflozin propanediol (FARXIGA) 5 MG TABS tablet Take 5 mg by mouth daily.   diclofenac (VOLTAREN) 50 MG EC tablet Take 50 mg by mouth 2 (two) times daily.   diclofenac Sodium (VOLTAREN) 1 % GEL Apply 2 g topically 4 (four) times daily.   diphenoxylate-atropine (LOMOTIL) 2.5-0.025 MG tablet  Take by mouth 4 (four) times daily as needed for diarrhea or loose stools.   dorzolamide (TRUSOPT) 2 % ophthalmic solution 1 drop 3 (three) times daily.   esomeprazole (NEXIUM) 40 MG capsule Take 40 mg by mouth daily at 12 noon.   famotidine (PEPCID) 20 MG tablet Take 20 mg by mouth daily.   levothyroxine  (SYNTHROID ) 50 MCG tablet Take 1 tablet (50 mcg total) by mouth daily.   magic mouthwash (lidocaine, diphenhydrAMINE , alum & mag hydroxide) suspension 5 mLs.   Magnesium  500 MG CAPS Take 1 capsule (500 mg total) by mouth daily.   OLANZapine (ZYPREXA) 5 MG tablet Take 5 mg by mouth at bedtime.   ondansetron  (ZOFRAN ) 4 MG tablet Take 4 mg by mouth every 8 (eight) hours as needed for nausea or vomiting.   potassium chloride  (KLOR-CON ) 10 MEQ tablet Take by mouth.   pregabalin (LYRICA) 200 MG capsule Take 200 mg by mouth 2 (two) times daily.   prochlorperazine (COMPAZINE) 10 MG tablet Take 10 mg by mouth every 6 (six) hours as needed for nausea or vomiting.   senna (SENOKOT) 8.6 MG TABS tablet Take 1 tablet by mouth.   thiamine 50 MG tablet Take 50 mg by mouth daily.   traMADol  (ULTRAM ) 50 MG tablet Take 50 mg by mouth every 4 (four) hours as needed.   traZODone (DESYREL) 50 MG tablet Take 50 mg by mouth at bedtime.   venlafaxine XR (EFFEXOR-XR) 75 MG 24 hr capsule Take 1 capsule (75 mg total) by mouth daily with breakfast.   [DISCONTINUED] potassium chloride  SA (KLOR-CON  M) 20 MEQ tablet Take 1 tablet (20 mEq total) by mouth 4 (four) times daily.   No facility-administered encounter medications on file as of 06/06/2024.    Social History: Social History   Tobacco Use   Smoking status: Every Day    Current packs/day: 1.00    Types: Cigarettes   Smokeless tobacco: Never  Substance Use Topics   Alcohol use: Yes    Alcohol/week: 20.0 standard drinks of alcohol    Types: 20 Glasses of wine per week   Drug use: No    Family Medical History: Family History  Problem Relation Age of Onset    Prostate cancer Father    Prostate cancer Maternal Grandfather    Cancer Other    Breast cancer Paternal Aunt    Lung cancer Maternal Uncle     Physical Examination: Vitals:   06/06/24 1431  BP: 104/62      Awake, alert, oriented to person, place, and time.  Speech is clear and fluent. Fund of knowledge is appropriate.   Cranial Nerves: Pupils equal round and reactive to light.  Facial tone is symmetric.    She has tenderness midline at bra strap region.   No posterior lumbar tenderness.   No abnormal lesions on exposed skin.   Strength: Side Biceps Triceps Deltoid Interossei Grip Wrist Ext. Wrist Flex.  R 5 5 5 5  5  5 5  L 5 5 5 5 5 5 5    Side Iliopsoas Quads Hamstring PF DF EHL  R 5 5 5 5 5 5   L 5 5 5 5 5 5    Reflexes are 1+ and symmetric at the biceps, triceps, brachioradialis, patella and achilles.   Hoffman's is absent.  Clonus is not present.   Bilateral upper and lower extremity sensation is intact to light touch.     Gait is slow.    Medical Decision Making  Imaging: No updated imaging.   Assessment and Plan: Ms. Brunner did well after T11 kyphoplasty  on 04/06/23.   She had a fall on 01/11/24 and fractured her clavicle. Since that time, she has constant mid back pain. No significant radiation to her ribs. No pain in her arms or legs. No numbness, tingling, or weakness in her arms/legs.   She has tenderness at mid line over T6 or T7.   She has known lumbar spondylosis with broad based disc L5-S1 with severe right and moderate/severe left foraminal stenosis.   No recent thoracic imaging.   Treatment options discussed with patient and following plan made:   - Thoracic xrays ordered.  - Will message her with xray results.  - If compression fracture is seen/suspected, will likely consider thoracic MRI as she is interested in kyphoplasty.  - Will determine follow up when I message her with results.   I spent a total of 25 minutes in face-to-face and  non-face-to-face activities related to this patient's care today including review of outside records, review of imaging, review of symptoms, physical exam, discussion of differential diagnosis, discussion of treatment options, and documentation.   Lucetta Russel PA-C Dept. of Neurosurgery

## 2024-06-03 ENCOUNTER — Ambulatory Visit: Admit: 2024-06-03 | Payer: Medicare (Managed Care) | Attending: Clinical | Primary: Clinical

## 2024-06-04 ENCOUNTER — Inpatient Hospital Stay: Admit: 2024-06-04 | Discharge: 2024-06-04 | Payer: Medicare (Managed Care)

## 2024-06-04 DIAGNOSIS — C50919 Malignant neoplasm of unspecified site of unspecified female breast: Principal | ICD-10-CM

## 2024-06-04 DIAGNOSIS — C50811 Malignant neoplasm of overlapping sites of right female breast: Principal | ICD-10-CM

## 2024-06-05 ENCOUNTER — Inpatient Hospital Stay: Admit: 2024-06-05 | Discharge: 2024-06-06 | Payer: Medicare (Managed Care)

## 2024-06-05 DIAGNOSIS — C50919 Malignant neoplasm of unspecified site of unspecified female breast: Principal | ICD-10-CM

## 2024-06-05 DIAGNOSIS — C50811 Malignant neoplasm of overlapping sites of right female breast: Principal | ICD-10-CM

## 2024-06-06 ENCOUNTER — Ambulatory Visit
Admission: RE | Admit: 2024-06-06 | Discharge: 2024-06-06 | Disposition: A | Discharge status: Home / Self Care | Source: Ambulatory Visit | Attending: Orthopedic Surgery | Admitting: Orthopedic Surgery

## 2024-06-06 ENCOUNTER — Ambulatory Visit
Admission: RE | Admit: 2024-06-06 | Discharge: 2024-06-06 | Disposition: A | Discharge status: Home / Self Care | Attending: Orthopedic Surgery | Admitting: Orthopedic Surgery

## 2024-06-06 ENCOUNTER — Ambulatory Visit: Admitting: Orthopedic Surgery

## 2024-06-06 ENCOUNTER — Ambulatory Visit: Payer: Self-pay

## 2024-06-06 ENCOUNTER — Encounter: Payer: Self-pay | Admitting: Orthopedic Surgery

## 2024-06-06 ENCOUNTER — Ambulatory Visit: Admitting: Family Medicine

## 2024-06-06 VITALS — BP 104/62 | Ht 62.0 in | Wt 99.0 lb

## 2024-06-06 DIAGNOSIS — C50811 Malignant neoplasm of overlapping sites of right female breast: Principal | ICD-10-CM

## 2024-06-06 DIAGNOSIS — C50919 Malignant neoplasm of unspecified site of unspecified female breast: Principal | ICD-10-CM

## 2024-06-06 DIAGNOSIS — R600 Localized edema: Principal | ICD-10-CM

## 2024-06-06 DIAGNOSIS — M546 Pain in thoracic spine: Secondary | ICD-10-CM

## 2024-06-06 DIAGNOSIS — M5127 Other intervertebral disc displacement, lumbosacral region: Secondary | ICD-10-CM

## 2024-06-06 DIAGNOSIS — W19XXXA Unspecified fall, initial encounter: Secondary | ICD-10-CM | POA: Diagnosis not present

## 2024-06-06 DIAGNOSIS — Z8781 Personal history of (healed) traumatic fracture: Secondary | ICD-10-CM | POA: Diagnosis not present

## 2024-06-06 DIAGNOSIS — M47816 Spondylosis without myelopathy or radiculopathy, lumbar region: Secondary | ICD-10-CM | POA: Diagnosis not present

## 2024-06-06 NOTE — Telephone Encounter (Signed)
 Patient aware the recommendation was to go to ED however she has not yet done so at this time. Reviewed she is scheduled for a visit tomorrow, however you states she may switch providers since she has troubles with getting appointments. Forwarding to PCP for review.

## 2024-06-06 NOTE — Telephone Encounter (Signed)
 Patient was offered appt at 11:20 this AM at 10AM. She came in for appointment at 11:45 and was not able to be seen.

## 2024-06-06 NOTE — Telephone Encounter (Signed)
 FYI Only or Action Required?: FYI only for provider  Patient was last seen in primary care on 03/12/2024 by Terre Ferri P, DO. Called Nurse Triage reporting Foot Swelling. Symptoms began yesterday. Interventions attempted: Other: Elevating feet. Symptoms are: Both feet very swollen left foot much more than her right rapidly worsening.  Triage Disposition: See HCP Within 4 Hours (Or PCP Triage)  Patient/caregiver understands and will follow disposition?: No appts in office. Called [facility] - unable to work in. Pt will go to ED          Copied from CRM [id number]. Topic: Clinical - Red Word Triage >> Jun 06, 2024  8:47 AM Barbara Malone wrote: Red Word that prompted transfer to Nurse Triage: Feet are swelling really bad, especially the left one. Started happening yesterday. Mentioned she is a cancer pt. Would like to come in today. Reason for Disposition  SEVERE leg swelling (e.g., swelling extends above knee, entire leg is swollen, weeping fluid)  Answer Assessment - Initial Assessment Questions 1. ONSET: When did the swelling start? (e.g., minutes, hours, days)     yesterday 2. LOCATION: What part of the leg is swollen?  Are both legs swollen or just one leg?     Both feet 3. SEVERITY: How bad is the swelling? (e.g., localized; mild, moderate, severe)   - Localized: Small area of swelling localized to one leg.   - MILD pedal edema: Swelling limited to foot and ankle, pitting edema < 1/4 inch (6 mm) deep, rest and elevation eliminate most or all swelling.   - MODERATE edema: Swelling of lower leg to knee, pitting edema > 1/4 inch (6 mm) deep, rest and elevation only partially reduce swelling.   - SEVERE edema: Swelling extends above knee, facial or hand swelling present.      severe 4. REDNESS: Does the swelling look red or infected?     no 5. PAIN: Is the swelling painful to touch? If Yes, ask: How painful is it?   (Scale 1-10; mild, moderate or severe)     yes 6. FEVER: Do  you have a fever? If Yes, ask: What is it, how was it measured, and when did it start?      no 7. CAUSE: What do you think is causing the leg swelling?     Heart valve 8. MEDICAL HISTORY: Do you have a history of blood clots (e.g., DVT), cancer, heart failure, kidney disease, or liver failure?     Cancer, Heart issues 9. RECURRENT SYMPTOM: Have you had leg swelling before? If Yes, ask: When was the last time? What happened that time?     no 10. OTHER SYMPTOMS: Do you have any other symptoms? (e.g., chest pain, difficulty breathing)       no 11. PREGNANCY: Is there any chance you are pregnant? When was your last menstrual period?       no  Protocols used: Leg Swelling and Edema-A-AH

## 2024-06-07 ENCOUNTER — Ambulatory Visit: Admitting: Family Medicine

## 2024-06-07 ENCOUNTER — Encounter: Payer: Self-pay | Admitting: Orthopedic Surgery

## 2024-06-07 NOTE — Telephone Encounter (Signed)
 Thoracic xrays dated 06/06/24:  T11 kyphoplasty noted. I don't appreciate any new compression deformities.   Considering her pain and history of CA and chronic prednisone use, may consider thoracic MRI. Message sent to patient.

## 2024-06-10 DIAGNOSIS — C50811 Malignant neoplasm of overlapping sites of right female breast: Principal | ICD-10-CM

## 2024-06-10 DIAGNOSIS — R7989 Other specified abnormal findings of blood chemistry: Principal | ICD-10-CM

## 2024-06-10 DIAGNOSIS — C50919 Malignant neoplasm of unspecified site of unspecified female breast: Principal | ICD-10-CM

## 2024-06-10 DIAGNOSIS — R110 Nausea: Principal | ICD-10-CM

## 2024-06-10 DIAGNOSIS — Z1159 Encounter for screening for other viral diseases: Principal | ICD-10-CM

## 2024-06-10 DIAGNOSIS — E039 Hypothyroidism, unspecified: Principal | ICD-10-CM

## 2024-06-10 DIAGNOSIS — K900 Celiac disease: Principal | ICD-10-CM

## 2024-06-10 MED ORDER — THIAMINE HCL (VITAMIN B1) 100 MG CAPSULE
ORAL_CAPSULE | Freq: Every day | ORAL | 3 refills | 0.00000 days | Status: CP
Start: 2024-06-10 — End: ?

## 2024-06-11 DIAGNOSIS — Z1159 Encounter for screening for other viral diseases: Principal | ICD-10-CM

## 2024-06-11 DIAGNOSIS — K900 Celiac disease: Principal | ICD-10-CM

## 2024-06-11 DIAGNOSIS — C50811 Malignant neoplasm of overlapping sites of right female breast: Principal | ICD-10-CM

## 2024-06-11 DIAGNOSIS — E039 Hypothyroidism, unspecified: Principal | ICD-10-CM

## 2024-06-11 DIAGNOSIS — R110 Nausea: Principal | ICD-10-CM

## 2024-06-11 DIAGNOSIS — C50919 Malignant neoplasm of unspecified site of unspecified female breast: Principal | ICD-10-CM

## 2024-06-11 DIAGNOSIS — R7989 Other specified abnormal findings of blood chemistry: Principal | ICD-10-CM

## 2024-06-12 ENCOUNTER — Ambulatory Visit: Payer: Self-pay

## 2024-06-12 NOTE — Telephone Encounter (Signed)
 FYI Only or Action Required?: Action required by provider  Patient was last seen in primary care on 03/12/2024 by Terre Ferri P, DO. Called Nurse Triage reporting Joint Swelling. Symptoms began several days ago. Interventions attempted: Rest, hydration, or home remedies. Symptoms are: unchanged.  Triage Disposition: See PCP When Office is Open (Within 3 Days)  Patient/caregiver understands and will follow disposition?: YesCopied from CRM [id number]. Topic: Clinical - Red Word Triage >> Jun 12, 2024  2:12 PM Rosaria Common wrote: Red Word that prompted transfer to Nurse Triage: Left ankle swelling mainly. Both ankles swelling Reason for Disposition  MILD or MODERATE ankle swelling (e.g., can't move joint normally, can't do usual activities) (Exceptions: Itchy, localized swelling; swelling is chronic.)  Answer Assessment - Initial Assessment Questions 1. LOCATION: Which ankle is swollen? Where is the swelling?     both 2. ONSET: When did the swelling start?     Last weekend  3. SWELLING: How bad is the swelling? Or, How large is it? (e.g., mild, moderate, severe; size of localized swelling)    - NONE: No joint swelling.   - LOCALIZED: Localized; small area of puffy or swollen skin (e.g., insect bite, skin irritation).   - MILD: Joint looks or feels mildly swollen or puffy.   - MODERATE: Swollen; interferes with normal activities (e.g., work or school); decreased range of movement; may be limping.   - SEVERE: Very swollen; can't move swollen joint at all; limping a lot or unable to walk.     mild 4. PAIN: Is there any pain? If Yes, ask: How bad is it? (Scale 1-10; or mild, moderate, severe)   - NONE (0): no pain.   - MILD (1-3): doesn't interfere with normal activities.    - MODERATE (4-7): interferes with normal activities (e.g., work or school) or awakens from sleep, limping.    - SEVERE (8-10): excruciating pain, unable to do any normal activities, unable to walk.      1 5.  CAUSE: What do you think caused the ankle swelling?     diet 6. OTHER SYMPTOMS: Do you have any other symptoms? (e.g., fever, chest pain, difficulty breathing, calf pain)     Skin itches from chemo and seems to be bruised and scarred   Puffy and feels bruised on left ankle. Right is swollen with no discomfort. Pt props feet up and swelling goes down. Please call pt to remind of appt. Pt stated I have 13 brain tumors and my memory isn't great.  Protocols used: Ankle Swelling-A-AH

## 2024-06-13 ENCOUNTER — Encounter: Payer: Self-pay | Admitting: Family Medicine

## 2024-06-13 ENCOUNTER — Ambulatory Visit
Admission: RE | Admit: 2024-06-13 | Discharge: 2024-06-13 | Disposition: A | Discharge status: Home / Self Care | Source: Ambulatory Visit | Attending: Family Medicine | Admitting: Family Medicine

## 2024-06-13 ENCOUNTER — Ambulatory Visit (INDEPENDENT_AMBULATORY_CARE_PROVIDER_SITE_OTHER): Admitting: Family Medicine

## 2024-06-13 VITALS — BP 127/66 | HR 67 | Temp 97.9°F | Ht 62.0 in | Wt 98.0 lb

## 2024-06-13 DIAGNOSIS — F419 Anxiety disorder, unspecified: Secondary | ICD-10-CM | POA: Diagnosis not present

## 2024-06-13 DIAGNOSIS — R600 Localized edema: Secondary | ICD-10-CM | POA: Insufficient documentation

## 2024-06-13 NOTE — Patient Instructions (Signed)
 [phone] option 3

## 2024-06-13 NOTE — Assessment & Plan Note (Addendum)
 Under good control on current regimen. Continue current regimen. Continue to monitor. Call with any concerns. Still has 2 refills of her clonazepam  pending. Will hold on sending additional refill at this time.

## 2024-06-13 NOTE — Progress Notes (Signed)
 BP 127/66 (BP Location: Left Arm, Patient Position: Sitting, Cuff Size: Small)   Pulse 67   Temp 97.9 F (36.6 C) (Oral)   Ht 5' 2 (1.575 m)   Wt 98 lb (44.5 kg)   SpO2 98%   BMI 17.92 kg/m    Subjective:    Patient ID: Barbara Malone, female    DOB: Dec 27, 1955, 68 y.o.   MRN: [id number]  HPI: Barbara Malone is a 68 y.o. female  Chief Complaint  Patient presents with  . Joint Swelling    Both Ankles   . Anxiety   Has been having swelling in her legs for over a week. She has been having pain in her L foot last night. No redness. No fevers. Otherwise been feeling good.   ANXIETY/STRESS Duration:{Blank single:19197::controlled,uncontrolled,better,worse,exacerbated,stable} Anxious mood: {Blank single:19197::yes,no}  Excessive worrying: {Blank single:19197::yes,no} Irritability: {Blank single:19197::yes,no}  Sweating: {Blank single:19197::yes,no} Nausea: {Blank single:19197::yes,no} Palpitations:{Blank single:19197::yes,no} Hyperventilation: {Blank single:19197::yes,no} Panic attacks: {Blank single:19197::yes,no} Agoraphobia: {Blank single:19197::yes,no}  Obscessions/compulsions: {Blank single:19197::yes,no} Depressed mood: {Blank single:19197::yes,no}    06/13/2024    3:53 PM 03/12/2024   10:56 AM 07/31/2023    1:13 PM 06/06/2023    9:34 AM 04/27/2023    1:24 PM  Depression screen PHQ 2/9  Decreased Interest 1 1 2 3 3   Down, Depressed, Hopeless 1 2 2 2 3   PHQ - 2 Score 2 3 4 5 6   Altered sleeping 2 1 1 2 3   Tired, decreased energy 2 1 1 3 3   Change in appetite 3 2 3 3 3   Feeling bad or failure about yourself  3 1 1 3 3   Trouble concentrating 3 1 3 3 3   Moving slowly or fidgety/restless 2 0 1 3 3   Suicidal thoughts 0 0 0 1 1  PHQ-9 Score 17 9 14 23 25   Difficult doing work/chores Somewhat difficult Not difficult at all Somewhat difficult  Extremely dIfficult   Anhedonia: {Blank single:19197::yes,no} Weight changes:  {Blank single:19197::yes,no} Insomnia: {Blank single:19197::yes,no} {Blank single:19197::hard to fall asleep,hard to stay asleep}  Hypersomnia: {Blank single:19197::yes,no} Fatigue/loss of energy: {Blank single:19197::yes,no} Feelings of worthlessness: {Blank single:19197::yes,no} Feelings of guilt: {Blank single:19197::yes,no} Impaired concentration/indecisiveness: {Blank single:19197::yes,no} Suicidal ideations: {Blank single:19197::yes,no}  Crying spells: {Blank single:19197::yes,no} Recent Stressors/Life Changes: {Blank single:19197::yes,no}   Relationship problems: {Blank single:19197::yes,no}   Family stress: {Blank single:19197::yes,no}     Financial stress: {Blank single:19197::yes,no}    Job stress: {Blank single:19197::yes,no}    Recent death/loss: {Blank single:19197::yes,no}   Relevant past medical, surgical, family and social history reviewed and updated as indicated. Interim medical history since our last visit reviewed. Allergies and medications reviewed and updated.  Review of Systems  Constitutional: Negative.   Respiratory: Negative.    Cardiovascular:  Positive for leg swelling. Negative for chest pain and palpitations.  Musculoskeletal: Negative.   Neurological: Negative.   Psychiatric/Behavioral: Negative.      Per HPI unless specifically indicated above     Objective:    BP 127/66 (BP Location: Left Arm, Patient Position: Sitting, Cuff Size: Small)   Pulse 67   Temp 97.9 F (36.6 C) (Oral)   Ht 5' 2 (1.575 m)   Wt 98 lb (44.5 kg)   SpO2 98%   BMI 17.92 kg/m   Wt Readings from Last 3 Encounters:  06/13/24 98 lb (44.5 kg)  06/06/24 99 lb (44.9 kg)  03/12/24 99 lb 9.6 oz (45.2 kg)    Physical Exam Vitals and nursing note reviewed.  Constitutional:      General: She is  not in acute distress.    Appearance: Normal appearance. She is ill-appearing. She is not toxic-appearing or  diaphoretic.  HENT:     Head: Normocephalic and atraumatic.     Right Ear: External ear normal.     Left Ear: External ear normal.     Nose: Nose normal.     Mouth/Throat:     Mouth: Mucous membranes are moist.     Pharynx: Oropharynx is clear.   Eyes:     General: No scleral icterus.       Right eye: No discharge.        Left eye: No discharge.     Extraocular Movements: Extraocular movements intact.     Conjunctiva/sclera: Conjunctivae normal.     Pupils: Pupils are equal, round, and reactive to light.    Cardiovascular:     Rate and Rhythm: Normal rate and regular rhythm.     Pulses: Normal pulses.     Heart sounds: Normal heart sounds. No murmur heard.    No friction rub. No gallop.  Pulmonary:     Effort: Pulmonary effort is normal. No respiratory distress.     Breath sounds: Normal breath sounds. No stridor. No wheezing, rhonchi or rales.  Chest:     Chest wall: No tenderness.   Musculoskeletal:        General: Normal range of motion.     Cervical back: Normal range of motion and neck supple.     Comments: Very trace swelling around bilateral ankles.    Skin:    General: Skin is warm and dry.     Capillary Refill: Capillary refill takes less than 2 seconds.     Coloration: Skin is not jaundiced or pale.     Findings: No bruising, erythema, lesion or rash.   Neurological:     General: No focal deficit present.     Mental Status: She is alert and oriented to person, place, and time. Mental status is at baseline.   Psychiatric:        Mood and Affect: Mood normal.        Behavior: Behavior normal.        Thought Content: Thought content normal.        Judgment: Judgment normal.    Results for orders placed or performed in visit on 06/13/24  CBC and differential   Collection Time: 05/24/24 12:00 AM  Result Value Ref Range   Hemoglobin 13.2 12.0 - 16.0   HCT 38 36 - 46   Platelets 93 (A) 150 - 400 K/uL   WBC 4.2   Basic metabolic panel with GFR    Collection Time: 05/24/24 12:00 AM  Result Value Ref Range   Glucose 111    BUN 5 4 - 21   CO2 24 (A) 13 - 22   Creatinine 0.5 0.5 - 1.1   Potassium 4.6 3.5 - 5.1 mEq/L   Sodium 145 137 - 147   Chloride 111 (A) 99 - 108  Comprehensive metabolic panel with GFR   Collection Time: 05/24/24 12:00 AM  Result Value Ref Range   Calcium 8.1 (A) 8.7 - 10.7   Albumin 2.5 (A) 3.5 - 5.0  Hepatic function panel   Collection Time: 05/24/24 12:00 AM  Result Value Ref Range   Alkaline Phosphatase 326 (A) 25 - 125   ALT 41 (A) 7 - 35 U/L   AST 88 (A) 13 - 35   Bilirubin, Total 0.9   TSH  Collection Time: 05/24/24 12:00 AM  Result Value Ref Range   TSH 2.78 0.41 - 5.90      Assessment & Plan:   Problem List Items Addressed This Visit       Other   Anxiety   Under good control on current regimen. Continue current regimen. Continue to monitor. Call with any concerns. Still has 2 refills of her clonazepam  pending. Will hold on sending additional refill at this time.        Other Visit Diagnoses       Peripheral edema    -  Primary   Given cancer, will get her US  to r/o DVT. Await results.   Relevant Orders   US  Venous Img Lower Bilateral        Follow up plan: Return in about 3 months (around 09/13/2024).

## 2024-06-14 ENCOUNTER — Telehealth: Payer: Self-pay | Admitting: Orthopedic Surgery

## 2024-06-14 ENCOUNTER — Telehealth: Payer: Self-pay | Admitting: Family Medicine

## 2024-06-14 ENCOUNTER — Ambulatory Visit: Payer: Self-pay | Admitting: Family Medicine

## 2024-06-14 ENCOUNTER — Encounter: Payer: Self-pay | Admitting: Family Medicine

## 2024-06-14 NOTE — Telephone Encounter (Signed)
 Radiology read her xrays and they showed wear and tear. No fractures. She did not want to get thoracic MRI.   Does she want to follow up with me in 4-6 weeks? Currently she has no follow up.

## 2024-06-14 NOTE — Telephone Encounter (Signed)
 I spoke to Jenette Mitchell patient's sister and she said they will call if they need to set her up an appointment.

## 2024-06-14 NOTE — Telephone Encounter (Signed)
 Called patient to come back to the office  to sign an authorization for  release of information. Patient stated that she will talk to her sister.Form has been placed in the  completed bin for patient to sign.

## 2024-06-21 ENCOUNTER — Ambulatory Visit: Admit: 2024-06-21 | Discharge: 2024-06-21 | Payer: MEDICARE

## 2024-06-21 ENCOUNTER — Encounter: Admit: 2024-06-21 | Discharge: 2024-06-21 | Payer: MEDICARE | Attending: Medical Oncology | Primary: Medical Oncology

## 2024-06-21 ENCOUNTER — Encounter: Admit: 2024-06-21 | Discharge: 2024-06-21 | Payer: MEDICARE

## 2024-06-21 DIAGNOSIS — E039 Hypothyroidism, unspecified: Principal | ICD-10-CM

## 2024-06-21 DIAGNOSIS — C50919 Malignant neoplasm of unspecified site of unspecified female breast: Principal | ICD-10-CM

## 2024-06-21 DIAGNOSIS — C50811 Malignant neoplasm of overlapping sites of right female breast: Principal | ICD-10-CM

## 2024-06-21 DIAGNOSIS — R110 Nausea: Principal | ICD-10-CM

## 2024-06-22 DIAGNOSIS — C50919 Malignant neoplasm of unspecified site of unspecified female breast: Principal | ICD-10-CM

## 2024-06-22 DIAGNOSIS — C50811 Malignant neoplasm of overlapping sites of right female breast: Principal | ICD-10-CM

## 2024-06-24 DIAGNOSIS — C50811 Malignant neoplasm of overlapping sites of right female breast: Principal | ICD-10-CM

## 2024-06-24 DIAGNOSIS — C50919 Malignant neoplasm of unspecified site of unspecified female breast: Principal | ICD-10-CM

## 2024-06-25 ENCOUNTER — Inpatient Hospital Stay: Admit: 2024-06-25 | Discharge: 2024-06-25 | Payer: MEDICARE

## 2024-06-25 DIAGNOSIS — C50811 Malignant neoplasm of overlapping sites of right female breast: Principal | ICD-10-CM

## 2024-06-25 DIAGNOSIS — C50919 Malignant neoplasm of unspecified site of unspecified female breast: Principal | ICD-10-CM

## 2024-06-27 ENCOUNTER — Encounter: Payer: Self-pay | Admitting: Nurse Practitioner

## 2024-06-27 ENCOUNTER — Ambulatory Visit (INDEPENDENT_AMBULATORY_CARE_PROVIDER_SITE_OTHER): Admitting: Nurse Practitioner

## 2024-06-27 ENCOUNTER — Telehealth: Payer: Self-pay | Admitting: Family Medicine

## 2024-06-27 ENCOUNTER — Telehealth: Payer: Self-pay

## 2024-06-27 VITALS — BP 122/68 | HR 75 | Temp 98.6°F | Wt 94.4 lb

## 2024-06-27 DIAGNOSIS — R600 Localized edema: Secondary | ICD-10-CM | POA: Diagnosis not present

## 2024-06-27 MED ORDER — CETIRIZINE HCL 10 MG PO TABS: 10.0000 mg | ORAL_TABLET | Freq: Every day | ORAL | 11 refills | Status: AC

## 2024-06-27 NOTE — Telephone Encounter (Signed)
 E2C2 error reported and patient will be contacted likely to follow up.

## 2024-06-27 NOTE — Progress Notes (Signed)
 BP 122/68   Pulse 75   Temp 98.6 F (37 C) (Oral)   Wt 94 lb 6.4 oz (42.8 kg)   SpO2 98%   BMI 17.27 kg/m    Subjective:    Patient ID: Barbara Malone, female    DOB: 1956-05-30, 68 y.o.   MRN: [id number]  HPI: Barbara Malone is a 68 y.o. female  Chief Complaint  Patient presents with   Leg Swelling    Going on for about 2 weeks now    Patient states she has been having some lower leg swelling that has been ongoing for a couple of weeks.  She has already had lower extremity US  to rule out DVT and ECHO.      Relevant past medical, surgical, family and social history reviewed and updated as indicated. Interim medical history since our last visit reviewed. Allergies and medications reviewed and updated.  Review of Systems  Cardiovascular:  Positive for leg swelling.    Per HPI unless specifically indicated above     Objective:    BP 122/68   Pulse 75   Temp 98.6 F (37 C) (Oral)   Wt 94 lb 6.4 oz (42.8 kg)   SpO2 98%   BMI 17.27 kg/m   Wt Readings from Last 3 Encounters:  06/27/24 94 lb 6.4 oz (42.8 kg)  06/13/24 98 lb (44.5 kg)  06/06/24 99 lb (44.9 kg)    Physical Exam Vitals and nursing note reviewed.  Constitutional:      General: She is not in acute distress.    Appearance: Normal appearance. She is normal weight. She is not ill-appearing, toxic-appearing or diaphoretic.  HENT:     Head: Normocephalic.     Right Ear: External ear normal.     Left Ear: External ear normal.     Nose: Nose normal.     Mouth/Throat:     Mouth: Mucous membranes are moist.     Pharynx: Oropharynx is clear.  Eyes:     General:        Right eye: No discharge.        Left eye: No discharge.     Extraocular Movements: Extraocular movements intact.     Conjunctiva/sclera: Conjunctivae normal.     Pupils: Pupils are equal, round, and reactive to light.  Cardiovascular:     Rate and Rhythm: Normal rate and regular rhythm.     Heart sounds: No murmur heard. Pulmonary:      Effort: Pulmonary effort is normal. No respiratory distress.     Breath sounds: Normal breath sounds. No wheezing or rales.  Musculoskeletal:     Cervical back: Normal range of motion and neck supple.     Right lower leg: 2+ Pitting Edema present.     Left lower leg: 2+ Pitting Edema present.  Skin:    General: Skin is warm and dry.     Capillary Refill: Capillary refill takes less than 2 seconds.  Neurological:     General: No focal deficit present.     Mental Status: She is alert and oriented to person, place, and time. Mental status is at baseline.  Psychiatric:        Mood and Affect: Mood normal.        Behavior: Behavior normal.        Thought Content: Thought content normal.        Judgment: Judgment normal.     Results for orders placed or performed in visit on 06/13/24  CBC and differential   Collection Time: 05/24/24 12:00 AM  Result Value Ref Range   Hemoglobin 13.2 12.0 - 16.0   HCT 38 36 - 46   Platelets 93 (A) 150 - 400 K/uL   WBC 4.2   Basic metabolic panel with GFR   Collection Time: 05/24/24 12:00 AM  Result Value Ref Range   Glucose 111    BUN 5 4 - 21   CO2 24 (A) 13 - 22   Creatinine 0.5 0.5 - 1.1   Potassium 4.6 3.5 - 5.1 mEq/L   Sodium 145 137 - 147   Chloride 111 (A) 99 - 108  Comprehensive metabolic panel with GFR   Collection Time: 05/24/24 12:00 AM  Result Value Ref Range   Calcium 8.1 (A) 8.7 - 10.7   Albumin 2.5 (A) 3.5 - 5.0  Hepatic function panel   Collection Time: 05/24/24 12:00 AM  Result Value Ref Range   Alkaline Phosphatase 326 (A) 25 - 125   ALT 41 (A) 7 - 35 U/L   AST 88 (A) 13 - 35   Bilirubin, Total 0.9   TSH   Collection Time: 05/24/24 12:00 AM  Result Value Ref Range   TSH 2.78 0.41 - 5.90      Assessment & Plan:   Problem List Items Addressed This Visit   None Visit Diagnoses       Peripheral edema    -  Primary   Multifactoral- due to chemo and sedentary lifestyle. Patient has an appointment with Cardiology  coming up. Encouraged compression socks- will order for patient.        Follow up plan: No follow-ups on file.

## 2024-06-27 NOTE — Telephone Encounter (Signed)
 Ok for E2C2 to review.  Left message for patient. Will need to measure patient for compression stockings. Can be seen in office today if before 5pm otherwise please schedule her for a nurse visit next week here at [facility].

## 2024-06-27 NOTE — Telephone Encounter (Signed)
 Copied from CRM [id number]. Topic: General - Call Back - No Documentation >> Jun 27, 2024  4:01 PM Donee H wrote: Reason for CRM: Patient stating was returning miss call. There is no documentation for nature of call. Follow back up with patient at [phone]

## 2024-06-29 DIAGNOSIS — R197 Diarrhea, unspecified: Principal | ICD-10-CM

## 2024-06-29 MED ORDER — DIPHENOXYLATE-ATROPINE 2.5 MG-0.025 MG TABLET
ORAL_TABLET | Freq: Four times a day (QID) | ORAL | 0 refills | 0.00000 days | PRN
Start: 2024-06-29 — End: ?

## 2024-07-01 DIAGNOSIS — C50811 Malignant neoplasm of overlapping sites of right female breast: Principal | ICD-10-CM

## 2024-07-01 DIAGNOSIS — C50919 Malignant neoplasm of unspecified site of unspecified female breast: Principal | ICD-10-CM

## 2024-07-02 DIAGNOSIS — C50811 Malignant neoplasm of overlapping sites of right female breast: Principal | ICD-10-CM

## 2024-07-02 DIAGNOSIS — C50919 Malignant neoplasm of unspecified site of unspecified female breast: Principal | ICD-10-CM

## 2024-07-02 MED ORDER — DIPHENOXYLATE-ATROPINE 2.5 MG-0.025 MG TABLET
ORAL_TABLET | Freq: Four times a day (QID) | ORAL | 0 refills | 8.00000 days | Status: CP | PRN
Start: 2024-07-02 — End: ?

## 2024-07-04 DIAGNOSIS — C50919 Malignant neoplasm of unspecified site of unspecified female breast: Principal | ICD-10-CM

## 2024-07-04 DIAGNOSIS — C50811 Malignant neoplasm of overlapping sites of right female breast: Principal | ICD-10-CM

## 2024-07-04 DIAGNOSIS — Z515 Encounter for palliative care: Principal | ICD-10-CM

## 2024-07-05 ENCOUNTER — Ambulatory Visit: Admit: 2024-07-05 | Payer: Medicare (Managed Care)

## 2024-07-08 DIAGNOSIS — C50919 Malignant neoplasm of unspecified site of unspecified female breast: Principal | ICD-10-CM

## 2024-07-08 DIAGNOSIS — C50811 Malignant neoplasm of overlapping sites of right female breast: Principal | ICD-10-CM

## 2024-07-08 MED ORDER — LEVOTHYROXINE 50 MCG TABLET
ORAL_TABLET | Freq: Every day | ORAL | 11 refills | 30.00000 days | Status: CP
Start: 2024-07-08 — End: 2025-07-08

## 2024-07-10 DIAGNOSIS — C50811 Malignant neoplasm of overlapping sites of right female breast: Principal | ICD-10-CM

## 2024-07-10 DIAGNOSIS — C50919 Malignant neoplasm of unspecified site of unspecified female breast: Principal | ICD-10-CM

## 2024-07-11 ENCOUNTER — Ambulatory Visit: Admit: 2024-07-11 | Discharge: 2024-07-12 | Payer: Medicare (Managed Care)

## 2024-07-11 DIAGNOSIS — R600 Localized edema: Principal | ICD-10-CM

## 2024-07-11 MED ORDER — FUROSEMIDE 20 MG TABLET
ORAL_TABLET | Freq: Every day | ORAL | 6 refills | 30.00000 days | Status: CP | PRN
Start: 2024-07-11 — End: ?

## 2024-07-16 DIAGNOSIS — C50811 Malignant neoplasm of overlapping sites of right female breast: Principal | ICD-10-CM

## 2024-07-16 DIAGNOSIS — C50919 Malignant neoplasm of unspecified site of unspecified female breast: Principal | ICD-10-CM

## 2024-07-18 ENCOUNTER — Inpatient Hospital Stay: Admit: 2024-07-18 | Discharge: 2024-07-18 | Payer: Medicare (Managed Care)

## 2024-07-18 ENCOUNTER — Inpatient Hospital Stay: Admit: 2024-07-18 | Discharge: 2024-07-19 | Payer: Medicare (Managed Care)

## 2024-07-19 ENCOUNTER — Ambulatory Visit: Admit: 2024-07-19 | Discharge: 2024-07-20 | Payer: Medicare (Managed Care)

## 2024-07-19 ENCOUNTER — Encounter
Admit: 2024-07-19 | Discharge: 2024-07-20 | Payer: Medicare (Managed Care) | Attending: Medical Oncology | Primary: Medical Oncology

## 2024-07-19 ENCOUNTER — Encounter: Admit: 2024-07-19 | Discharge: 2024-07-20 | Payer: Medicare (Managed Care)

## 2024-07-19 DIAGNOSIS — C50811 Malignant neoplasm of overlapping sites of right female breast: Principal | ICD-10-CM

## 2024-07-19 DIAGNOSIS — E039 Hypothyroidism, unspecified: Principal | ICD-10-CM

## 2024-07-19 DIAGNOSIS — K900 Celiac disease: Principal | ICD-10-CM

## 2024-07-19 DIAGNOSIS — R110 Nausea: Principal | ICD-10-CM

## 2024-07-19 DIAGNOSIS — C50919 Malignant neoplasm of unspecified site of unspecified female breast: Principal | ICD-10-CM

## 2024-07-19 DIAGNOSIS — Z1159 Encounter for screening for other viral diseases: Principal | ICD-10-CM

## 2024-07-19 DIAGNOSIS — R7989 Other specified abnormal findings of blood chemistry: Principal | ICD-10-CM

## 2024-07-19 DIAGNOSIS — R600 Localized edema: Principal | ICD-10-CM

## 2024-07-20 DIAGNOSIS — K900 Celiac disease: Principal | ICD-10-CM

## 2024-07-20 DIAGNOSIS — E039 Hypothyroidism, unspecified: Principal | ICD-10-CM

## 2024-07-20 DIAGNOSIS — R110 Nausea: Principal | ICD-10-CM

## 2024-07-20 DIAGNOSIS — Z1159 Encounter for screening for other viral diseases: Principal | ICD-10-CM

## 2024-07-20 DIAGNOSIS — C50811 Malignant neoplasm of overlapping sites of right female breast: Principal | ICD-10-CM

## 2024-07-20 DIAGNOSIS — R7989 Other specified abnormal findings of blood chemistry: Principal | ICD-10-CM

## 2024-07-20 DIAGNOSIS — C50919 Malignant neoplasm of unspecified site of unspecified female breast: Principal | ICD-10-CM

## 2024-07-24 ENCOUNTER — Encounter: Admit: 2024-07-24 | Payer: Medicare (Managed Care)

## 2024-07-24 ENCOUNTER — Telehealth: Payer: Self-pay

## 2024-07-24 NOTE — Telephone Encounter (Signed)
 We can fill paperwork out for [facility] through [employer]

## 2024-07-24 NOTE — Telephone Encounter (Signed)
 Copied from CRM [id number]. Topic: Referral - Request for Referral >> Jul 24, 2024  9:38 AM Nathanel BROCKS wrote: Sari, sister [phone]   Did the patient discuss referral with their provider in the last year? No (If No - schedule appointment) (If Yes - send message)  Appointment offered? No  Type of order/referral and detailed reason for visit: [facility] [place]. She has been on chemo and they have had to stop. She is down to 87 lbs and is in need of them to help cook food, light house work, give meds, bath.  Preference of office, provider, location: [place]  If referral order, have you been seen by this specialty before? No (If Yes, this issue or another issue? When? Where?  Can we respond through MyChart? No

## 2024-07-25 DIAGNOSIS — C50811 Malignant neoplasm of overlapping sites of right female breast: Principal | ICD-10-CM

## 2024-07-25 DIAGNOSIS — R051 Acute cough: Principal | ICD-10-CM

## 2024-07-25 DIAGNOSIS — C50919 Malignant neoplasm of unspecified site of unspecified female breast: Principal | ICD-10-CM

## 2024-07-25 DIAGNOSIS — Z515 Encounter for palliative care: Principal | ICD-10-CM

## 2024-07-25 DIAGNOSIS — R059 Cough, unspecified type: Principal | ICD-10-CM

## 2024-07-25 MED ORDER — PREDNISONE 10 MG TABLET
ORAL_TABLET | ORAL | 0 refills | 0.00000 days | Status: CP
Start: 2024-07-25 — End: ?

## 2024-07-26 DIAGNOSIS — C50811 Malignant neoplasm of overlapping sites of right female breast: Principal | ICD-10-CM

## 2024-07-26 DIAGNOSIS — C50919 Malignant neoplasm of unspecified site of unspecified female breast: Principal | ICD-10-CM

## 2024-07-29 ENCOUNTER — Telehealth: Payer: Self-pay

## 2024-07-29 NOTE — Telephone Encounter (Signed)
 Copied from CRM [id number]. Topic: General - Other >> Jul 29, 2024  8:40 AM Marissa P wrote: Reason for CRM: Patients sister called regarding the forms from [place], [place] P. Needs it right away please and please notify sister as listed on the note please call.

## 2024-07-30 DIAGNOSIS — C50811 Malignant neoplasm of overlapping sites of right female breast: Principal | ICD-10-CM

## 2024-07-30 DIAGNOSIS — C50919 Malignant neoplasm of unspecified site of unspecified female breast: Principal | ICD-10-CM

## 2024-07-30 DIAGNOSIS — K219 Gastro-esophageal reflux disease without esophagitis: Principal | ICD-10-CM

## 2024-07-30 MED ORDER — ESOMEPRAZOLE MAGNESIUM 40 MG CAPSULE,DELAYED RELEASE
ORAL_CAPSULE | Freq: Two times a day (BID) | ORAL | 2 refills | 30.00000 days | Status: CP
Start: 2024-07-30 — End: ?

## 2024-08-01 ENCOUNTER — Encounter: Admit: 2024-08-01 | Payer: Medicare (Managed Care)

## 2024-08-01 DIAGNOSIS — Z515 Encounter for palliative care: Principal | ICD-10-CM

## 2024-08-01 DIAGNOSIS — C7931 Secondary malignant neoplasm of brain: Principal | ICD-10-CM

## 2024-08-01 DIAGNOSIS — C7802 Secondary malignant neoplasm of left lung: Principal | ICD-10-CM

## 2024-08-01 DIAGNOSIS — R627 Adult failure to thrive: Principal | ICD-10-CM

## 2024-08-01 DIAGNOSIS — Z9189 Other specified personal risk factors, not elsewhere classified: Secondary | ICD-10-CM | POA: Diagnosis not present

## 2024-08-01 DIAGNOSIS — G629 Polyneuropathy, unspecified: Secondary | ICD-10-CM | POA: Diagnosis not present

## 2024-08-01 DIAGNOSIS — C50811 Malignant neoplasm of overlapping sites of right female breast: Secondary | ICD-10-CM | POA: Diagnosis not present

## 2024-08-01 DIAGNOSIS — Z7189 Other specified counseling: Secondary | ICD-10-CM | POA: Diagnosis not present

## 2024-08-01 DIAGNOSIS — C50919 Malignant neoplasm of unspecified site of unspecified female breast: Secondary | ICD-10-CM | POA: Diagnosis not present

## 2024-08-01 DIAGNOSIS — F419 Anxiety disorder, unspecified: Secondary | ICD-10-CM | POA: Diagnosis not present

## 2024-08-01 DIAGNOSIS — R059 Cough, unspecified: Secondary | ICD-10-CM | POA: Diagnosis not present

## 2024-08-01 DIAGNOSIS — F32A Depression, unspecified: Secondary | ICD-10-CM | POA: Diagnosis not present

## 2024-08-01 MED ORDER — CLONAZEPAM 0.5 MG TABLET
ORAL_TABLET | ORAL | 0 refills | 0.00000 days | Status: CP
Start: 2024-08-01 — End: ?

## 2024-08-01 MED ORDER — VENLAFAXINE ER 37.5 MG CAPSULE,EXTENDED RELEASE 24 HR
ORAL_CAPSULE | ORAL | 0 refills | 0.00000 days | Status: CP
Start: 2024-08-01 — End: ?

## 2024-08-01 MED ORDER — PREDNISONE 10 MG TABLET
ORAL_TABLET | Freq: Every day | ORAL | 0 refills | 30.00000 days | Status: CP
Start: 2024-08-01 — End: ?

## 2024-08-02 DIAGNOSIS — C50811 Malignant neoplasm of overlapping sites of right female breast: Principal | ICD-10-CM

## 2024-08-02 DIAGNOSIS — C50919 Malignant neoplasm of unspecified site of unspecified female breast: Principal | ICD-10-CM

## 2024-08-09 ENCOUNTER — Ambulatory Visit: Admit: 2024-08-09 | Discharge: 2024-08-09 | Payer: Medicare (Managed Care)

## 2024-08-09 ENCOUNTER — Ambulatory Visit
Admit: 2024-08-09 | Discharge: 2024-08-09 | Payer: Medicare (Managed Care) | Attending: Medical Oncology | Primary: Medical Oncology

## 2024-08-09 ENCOUNTER — Encounter
Admit: 2024-08-09 | Discharge: 2024-08-09 | Payer: Medicare (Managed Care) | Attending: Medical Oncology | Primary: Medical Oncology

## 2024-08-09 DIAGNOSIS — C50919 Malignant neoplasm of unspecified site of unspecified female breast: Principal | ICD-10-CM

## 2024-08-09 DIAGNOSIS — C50811 Malignant neoplasm of overlapping sites of right female breast: Principal | ICD-10-CM

## 2024-08-10 DIAGNOSIS — C50919 Malignant neoplasm of unspecified site of unspecified female breast: Principal | ICD-10-CM

## 2024-08-10 DIAGNOSIS — C50811 Malignant neoplasm of overlapping sites of right female breast: Principal | ICD-10-CM

## 2024-08-13 NOTE — Telephone Encounter (Signed)
 Forms faxed

## 2024-08-16 ENCOUNTER — Encounter: Admit: 2024-08-16 | Payer: Medicare (Managed Care)

## 2024-08-16 ENCOUNTER — Ambulatory Visit: Admit: 2024-08-16 | Payer: Medicare (Managed Care)

## 2024-08-17 DIAGNOSIS — C50919 Malignant neoplasm of unspecified site of unspecified female breast: Principal | ICD-10-CM

## 2024-08-17 DIAGNOSIS — C50811 Malignant neoplasm of overlapping sites of right female breast: Principal | ICD-10-CM

## 2024-08-20 ENCOUNTER — Ambulatory Visit: Admitting: Pediatrics

## 2024-08-23 DIAGNOSIS — C50811 Malignant neoplasm of overlapping sites of right female breast: Principal | ICD-10-CM

## 2024-08-23 DIAGNOSIS — C50919 Malignant neoplasm of unspecified site of unspecified female breast: Principal | ICD-10-CM

## 2024-08-29 DIAGNOSIS — C50811 Malignant neoplasm of overlapping sites of right female breast: Principal | ICD-10-CM

## 2024-08-29 DIAGNOSIS — C50919 Malignant neoplasm of unspecified site of unspecified female breast: Principal | ICD-10-CM

## 2024-09-04 ENCOUNTER — Encounter: Payer: Self-pay | Admitting: Family Medicine

## 2024-09-04 DIAGNOSIS — C50811 Malignant neoplasm of overlapping sites of right female breast: Principal | ICD-10-CM

## 2024-09-04 DIAGNOSIS — C50919 Malignant neoplasm of unspecified site of unspecified female breast: Principal | ICD-10-CM

## 2024-09-05 DIAGNOSIS — C50919 Malignant neoplasm of unspecified site of unspecified female breast: Principal | ICD-10-CM

## 2024-09-05 DIAGNOSIS — C50811 Malignant neoplasm of overlapping sites of right female breast: Principal | ICD-10-CM

## 2024-09-06 ENCOUNTER — Encounter: Admit: 2024-09-06 | Payer: Medicaid (Managed Care)

## 2024-09-09 DIAGNOSIS — C50919 Malignant neoplasm of unspecified site of unspecified female breast: Principal | ICD-10-CM

## 2024-09-09 DIAGNOSIS — C50811 Malignant neoplasm of overlapping sites of right female breast: Principal | ICD-10-CM

## 2024-09-18 DIAGNOSIS — C50919 Malignant neoplasm of unspecified site of unspecified female breast: Principal | ICD-10-CM

## 2024-09-18 DIAGNOSIS — C50811 Malignant neoplasm of overlapping sites of right female breast: Principal | ICD-10-CM

## 2024-09-20 ENCOUNTER — Ambulatory Visit: Admitting: Family Medicine

## 2024-09-24 ENCOUNTER — Telehealth: Payer: Self-pay

## 2024-09-24 ENCOUNTER — Telehealth: Payer: Self-pay | Admitting: Family Medicine

## 2024-09-24 NOTE — Telephone Encounter (Signed)
 Copied from CRM #[id number]. Topic: Clinical - Medical Advice >> Sep 24, 2024  9:17 AM Leonette SQUIBB wrote: Reason for CRM: Sari patients sister would like Dr. Ferdie nurse to call her back in regards to an appt that her sister missed.  CB#  [phone]

## 2024-09-24 NOTE — Telephone Encounter (Signed)
 Copied from CRM [id number]. Topic: General - Other >> Sep 23, 2024  3:53 PM Jasmin G wrote: Reason for CRM: Nurse with [facility] faxed over a [phone] form request for personal care last week and states that she has not heard back since. Please fax request back at your earliest convenience at  [phone], as nurse states pt is in need of services.

## 2024-09-24 NOTE — Telephone Encounter (Signed)
 Pending for provider review and signature

## 2024-09-27 ENCOUNTER — Encounter: Admit: 2024-09-27 | Discharge: 2024-09-28 | Payer: Medicaid (Managed Care)

## 2024-09-30 NOTE — Telephone Encounter (Signed)
 Pt no showed her appt last week

## 2024-10-03 ENCOUNTER — Telehealth: Payer: Self-pay | Admitting: Family Medicine

## 2024-10-03 NOTE — Telephone Encounter (Signed)
 Copied from CRM [id number]. Topic: General - Other >> Oct 03, 2024  4:16 PM Wess RAMAN wrote: Reason for CRM: Lenward from [facility] would like to know the status of the home health paperwork sent via fax  Callback #: [phone] Fax #: [phone]

## 2024-10-07 NOTE — Telephone Encounter (Signed)
 Message to patient via mychart advising an appointment with PCP is needed in order to resubmit request.

## 2024-10-10 NOTE — Telephone Encounter (Signed)
 Spoke with [employer].

## 2024-10-10 NOTE — Telephone Encounter (Signed)
 This is fine

## 2024-10-14 NOTE — Telephone Encounter (Signed)
Scheduled virtual

## 2024-10-21 ENCOUNTER — Telehealth: Payer: Self-pay | Admitting: Family Medicine

## 2024-10-21 NOTE — Telephone Encounter (Unsigned)
 Copied from CRM [id number]. Topic: Referral - Request for Referral >> Oct 21, 2024  1:04 PM Olam RAMAN wrote: Did the patient discuss referral with their provider in the last year? Yes (If No - schedule appointment) (If Yes - send message)  Appointment offered? Yes  Type of order/referral and detailed reason for visit: [place]  Preference of office, provider, location: [facility]  If referral order, have you been seen by this specialty before? Yes (If Yes, this issue or another issue? When? Where?  Can we respond through MyChart? No

## 2024-10-24 NOTE — Telephone Encounter (Signed)
 Can referral be entered for [facility] per patient?

## 2024-10-24 NOTE — Telephone Encounter (Signed)
 Patient needs to be seen- virtual OK, I cannot refer her unless I've seen her within 90 days and I have not seen her since June

## 2024-10-24 NOTE — Telephone Encounter (Signed)
 Patient has upcoming appointment 10/28/24

## 2024-10-25 ENCOUNTER — Other Ambulatory Visit (HOSPITAL_COMMUNITY): Payer: Self-pay

## 2024-10-25 DIAGNOSIS — C50811 Malignant neoplasm of overlapping sites of right female breast: Principal | ICD-10-CM

## 2024-10-25 DIAGNOSIS — C50919 Malignant neoplasm of unspecified site of unspecified female breast: Principal | ICD-10-CM

## 2024-10-28 ENCOUNTER — Ambulatory Visit: Admitting: Family Medicine

## 2024-10-28 ENCOUNTER — Encounter: Payer: Self-pay | Admitting: Family Medicine

## 2024-10-28 ENCOUNTER — Telehealth (INDEPENDENT_AMBULATORY_CARE_PROVIDER_SITE_OTHER): Admitting: Family Medicine

## 2024-10-28 VITALS — Ht 62.0 in | Wt 100.0 lb

## 2024-10-28 DIAGNOSIS — C7802 Secondary malignant neoplasm of left lung: Secondary | ICD-10-CM

## 2024-10-28 DIAGNOSIS — C7801 Secondary malignant neoplasm of right lung: Secondary | ICD-10-CM

## 2024-10-28 DIAGNOSIS — C50911 Malignant neoplasm of unspecified site of right female breast: Secondary | ICD-10-CM

## 2024-10-28 DIAGNOSIS — S22070A Wedge compression fracture of T9-T10 vertebra, initial encounter for closed fracture: Secondary | ICD-10-CM | POA: Diagnosis not present

## 2024-10-28 DIAGNOSIS — Z515 Encounter for palliative care: Secondary | ICD-10-CM | POA: Diagnosis not present

## 2024-10-28 DIAGNOSIS — E039 Hypothyroidism, unspecified: Secondary | ICD-10-CM | POA: Diagnosis not present

## 2024-10-28 DIAGNOSIS — F419 Anxiety disorder, unspecified: Secondary | ICD-10-CM | POA: Diagnosis not present

## 2024-10-28 DIAGNOSIS — F339 Major depressive disorder, recurrent, unspecified: Secondary | ICD-10-CM

## 2024-10-28 DIAGNOSIS — I270 Primary pulmonary hypertension: Secondary | ICD-10-CM

## 2024-10-28 DIAGNOSIS — C7931 Secondary malignant neoplasm of brain: Secondary | ICD-10-CM | POA: Diagnosis not present

## 2024-10-28 NOTE — Assessment & Plan Note (Signed)
 Needs personal care services- will order today.

## 2024-10-28 NOTE — Assessment & Plan Note (Signed)
 Discussed that synthroid  is not one of the covered medicines for hospice. I'm happy to write it for her if she would like to stay on it- will get labs drawn at the oncologist.

## 2024-10-28 NOTE — Progress Notes (Signed)
 Ht 5' 2 (1.575 m)   Wt 100 lb (45.4 kg)   BMI 18.29 kg/m    Subjective:    Patient ID: Barbara Malone, female    DOB: [birth year], 68 y.o.   MRN: [id number]  HPI: Barbara Malone is a 68 y.o. female  Chief Complaint  Patient presents with   Hospice Patient   Barbara Malone presents today for follow up. She needs additional help at home and would like personal care services. She was on hospice due to her metastatic breast cancer. She canceled them this morning. She is not doing well. She is very weak. She is having less swelling. She has been using her compression socks. She is frustrated with hospice and feels like no one is watching her cancer. She is not sure what medicine she is taking. No other concerns or complaints at this time.   Relevant past medical, surgical, family and social history reviewed and updated as indicated. Interim medical history since our last visit reviewed. Allergies and medications reviewed and updated.  Review of Systems  Constitutional:  Positive for fatigue. Negative for activity change, appetite change, chills, diaphoresis, fever and unexpected weight change.  Respiratory: Negative.    Cardiovascular: Negative.   Musculoskeletal:  Positive for arthralgias and myalgias. Negative for back pain, gait problem, joint swelling, neck pain and neck stiffness.  Skin: Negative.   Neurological: Negative.   Psychiatric/Behavioral: Negative.      Per HPI unless specifically indicated above     Objective:    Ht 5' 2 (1.575 m)   Wt 100 lb (45.4 kg)   BMI 18.29 kg/m   Wt Readings from Last 3 Encounters:  10/28/24 100 lb (45.4 kg)  06/27/24 94 lb 6.4 oz (42.8 kg)  06/13/24 98 lb (44.5 kg)    Physical Exam Vitals and nursing note reviewed.  Constitutional:      General: She is not in acute distress.    Appearance: Normal appearance. She is not ill-appearing, toxic-appearing or diaphoretic.  HENT:     Head: Normocephalic and atraumatic.     Right Ear: External ear  normal.     Left Ear: External ear normal.     Nose: Nose normal.     Mouth/Throat:     Mouth: Mucous membranes are moist.     Pharynx: Oropharynx is clear.  Eyes:     General: No scleral icterus.       Right eye: No discharge.        Left eye: No discharge.     Conjunctiva/sclera: Conjunctivae normal.     Pupils: Pupils are equal, round, and reactive to light.  Pulmonary:     Effort: Pulmonary effort is normal. No respiratory distress.     Comments: Speaking in full sentences Musculoskeletal:        General: Normal range of motion.     Cervical back: Normal range of motion.  Skin:    Coloration: Skin is not jaundiced or pale.     Findings: No bruising, erythema, lesion or rash.  Neurological:     Mental Status: She is alert and oriented to person, place, and time. Mental status is at baseline.  Psychiatric:        Mood and Affect: Mood normal.        Behavior: Behavior normal.        Thought Content: Thought content normal.        Judgment: Judgment normal.     Results for orders placed or performed in  visit on 06/13/24  CBC and differential   Collection Time: 05/24/24 12:00 AM  Result Value Ref Range   Hemoglobin 13.2 12.0 - 16.0   HCT 38 36 - 46   Platelets 93 (A) 150 - 400 K/uL   WBC 4.2   Basic metabolic panel with GFR   Collection Time: 05/24/24 12:00 AM  Result Value Ref Range   Glucose 111    BUN 5 4 - 21   CO2 24 (A) 13 - 22   Creatinine 0.5 0.5 - 1.1   Potassium 4.6 3.5 - 5.1 mEq/L   Sodium 145 137 - 147   Chloride 111 (A) 99 - 108  Comprehensive metabolic panel with GFR   Collection Time: 05/24/24 12:00 AM  Result Value Ref Range   Calcium 8.1 (A) 8.7 - 10.7   Albumin 2.5 (A) 3.5 - 5.0  Hepatic function panel   Collection Time: 05/24/24 12:00 AM  Result Value Ref Range   Alkaline Phosphatase 326 (A) 25 - 125   ALT 41 (A) 7 - 35 U/L   AST 88 (A) 13 - 35   Bilirubin, Total 0.9   TSH   Collection Time: 05/24/24 12:00 AM  Result Value Ref Range    TSH 2.78 0.41 - 5.90      Assessment & Plan:   Problem List Items Addressed This Visit       Cardiovascular and Mediastinum   Hypertension   Needs personal care services- will order today.       Pulmonary hypertension, primary [facility])   Needs personal care services- will order today.         Respiratory   Malignant neoplasm metastatic to both lungs [place])   Would like to see oncology again. She will reach out to them. Will see about doing labs there. Await results. Needs personal care services- will order today.         Endocrine   Acquired hypothyroidism   Discussed that synthroid  is not one of the covered medicines for hospice. I'm happy to write it for her if she would like to stay on it- will get labs drawn at the oncologist.         Nervous and Auditory   Cancer of right breast metastatic to brain [facility])   Would like to see oncology again. She will reach out to them. Will see about doing labs there. Await results.         Musculoskeletal and Integument   Closed wedge compression fracture of T10 vertebra [facility])   Needs personal care services- will order today.         Other   Anxiety   Needs personal care services- will order today.       Depression, recurrent   Needs personal care services- will order today.       Hospice care patient - Primary   Discouraged patient from cancelling hospice. Discussed that she can still see her providers if she wants even on hospice. She will call them and get it re-set up. Call with any concerns. Needs personal care services- will order today.         Follow up plan: Return in about 3 months (around 01/28/2025) for virtual OK.    This visit was completed via video visit through MyChart due to the restrictions of the COVID-19 pandemic. All issues as above were discussed and addressed. Physical exam was done as above through visual confirmation on video through MyChart. If it was felt  that the patient should be evaluated in  the office, they were directed there. The patient verbally consented to this visit. Location of the patient: home Location of the provider: work Those involved with this call:  Provider: Duwaine Louder, DO CMA: York Fogo, CMA, Front Desk/Registration: Claretta Maiden  Time spent on call: 15 minutes with patient face to face via video conference. More than 50% of this time was spent in counseling and coordination of care. 23 minutes total spent in review of patient's record and preparation of their chart.

## 2024-10-28 NOTE — Assessment & Plan Note (Addendum)
 Discouraged patient from cancelling hospice. Discussed that she can still see her providers if she wants even on hospice. She will call them and get it re-set up. Call with any concerns. Needs personal care services- will order today.

## 2024-10-28 NOTE — Assessment & Plan Note (Addendum)
 Would like to see oncology again. She will reach out to them. Will see about doing labs there. Await results. Needs personal care services- will order today.

## 2024-10-28 NOTE — Assessment & Plan Note (Signed)
 Would like to see oncology again. She will reach out to them. Will see about doing labs there. Await results.

## 2024-10-29 NOTE — Progress Notes (Signed)
 Appt scheduled

## 2024-10-30 NOTE — Progress Notes (Signed)
 That's not true. 2 totally different things.

## 2024-11-04 DIAGNOSIS — C50811 Malignant neoplasm of overlapping sites of right female breast: Principal | ICD-10-CM

## 2024-11-04 DIAGNOSIS — C50919 Malignant neoplasm of unspecified site of unspecified female breast: Principal | ICD-10-CM

## 2024-11-08 ENCOUNTER — Encounter: Admit: 2024-11-08 | Discharge: 2024-11-09 | Payer: Medicaid (Managed Care)

## 2024-11-12 ENCOUNTER — Other Ambulatory Visit: Payer: Self-pay

## 2024-11-12 NOTE — Telephone Encounter (Unsigned)
 Copied from CRM #[id number]. Topic: Referral - Status >> Nov 11, 2024  4:29 PM Avram MATSU wrote: Reason for CRM: patient is calling about her referral status and she is upset no ones reached out to her. Please advise [phone]

## 2024-11-12 NOTE — Telephone Encounter (Signed)
 [employer] called to request a 30- day prescription for dapagliflozin propanediol (FARXIGA) 5 MG TABS tablet.  The pharmacy reports they do not split bottles and therefore require a full 30-day supply.

## 2024-11-12 NOTE — Telephone Encounter (Signed)
 Please obtain more information. I don't know what this is referring to

## 2024-11-13 MED ORDER — DAPAGLIFLOZIN PROPANEDIOL 5 MG PO TABS: 5.0000 mg | ORAL_TABLET | Freq: Every day | ORAL | 3 refills | Status: AC

## 2024-12-30 ENCOUNTER — Encounter: Payer: Self-pay | Admitting: Family Medicine

## 2024-12-31 DIAGNOSIS — Z23 Encounter for immunization: Secondary | ICD-10-CM

## 2024-12-31 NOTE — Progress Notes (Signed)
 Patient is in office today for a nurse visit for Immunizations. Patient was given the flu vaccine in the left deltoid and the covid vaccine in the right deltoid. Patient tolerated both injections well.

## 2025-01-01 ENCOUNTER — Emergency Department
Admission: EM | Admit: 2025-01-01 | Discharge: 2025-01-01 | Disposition: A | Discharge status: Home / Self Care | Attending: Emergency Medicine | Admitting: Emergency Medicine

## 2025-01-01 ENCOUNTER — Encounter: Payer: Self-pay | Admitting: Emergency Medicine

## 2025-01-01 ENCOUNTER — Other Ambulatory Visit: Payer: Self-pay

## 2025-01-01 ENCOUNTER — Emergency Department

## 2025-01-01 DIAGNOSIS — S022XXA Fracture of nasal bones, initial encounter for closed fracture: Secondary | ICD-10-CM | POA: Diagnosis not present

## 2025-01-01 DIAGNOSIS — W01198A Fall on same level from slipping, tripping and stumbling with subsequent striking against other object, initial encounter: Secondary | ICD-10-CM | POA: Diagnosis not present

## 2025-01-01 DIAGNOSIS — Z853 Personal history of malignant neoplasm of breast: Secondary | ICD-10-CM | POA: Insufficient documentation

## 2025-01-01 DIAGNOSIS — S0083XA Contusion of other part of head, initial encounter: Secondary | ICD-10-CM | POA: Insufficient documentation

## 2025-01-01 DIAGNOSIS — Y92009 Unspecified place in unspecified non-institutional (private) residence as the place of occurrence of the external cause: Secondary | ICD-10-CM | POA: Diagnosis not present

## 2025-01-01 DIAGNOSIS — S0992XA Unspecified injury of nose, initial encounter: Secondary | ICD-10-CM | POA: Diagnosis present

## 2025-01-01 DIAGNOSIS — W19XXXA Unspecified fall, initial encounter: Secondary | ICD-10-CM

## 2025-01-01 NOTE — ED Provider Notes (Signed)
 "   [facility] Emergency Department Provider Note     Event Date/Time   First MD Initiated Contact with Patient 01/01/25 1145     (approximate)   History   Fall   HPI  Barbara Malone is a 69 y.o. female with a past medical history of metastatic breast cancer, brain tumor, anxiety, HLD presents to the ED following a mechanical fall at home.  Patient reports she woke up at 3 AM to use the restroom when she tripped and fell forward hitting her head onto a dresser.  She denies LOC.  She reports her PCP recently increased her trazodone which she contributes to the reason why she fell.  Denies any vision changes, nausea, dizziness or headaches.  Patient is not on any blood thinners.  Denies urinary symptoms.  Denies chest pain shortness of breath.  No other complaint.     Physical Exam   Triage Vital Signs: ED Triage Vitals  Encounter Vitals Group     BP 01/01/25 1047 121/66     Girls Systolic BP Percentile --      Girls Diastolic BP Percentile --      Boys Systolic BP Percentile --      Boys Diastolic BP Percentile --      Pulse Rate 01/01/25 1047 82     Resp 01/01/25 1047 16     Temp 01/01/25 1047 98.8 F (37.1 C)     Temp Source 01/01/25 1047 Oral     SpO2 01/01/25 1047 95 %     Weight 01/01/25 1048 100 lb (45.4 kg)     Height 01/01/25 1048 5' 1 (1.549 m)     Head Circumference --      Peak Flow --      Pain Score 01/01/25 1052 10     Pain Loc --      Pain Education --      Exclude from Growth Chart --     Most recent vital signs: Vitals:   01/01/25 1047  BP: 121/66  Pulse: 82  Resp: 16  Temp: 98.8 F (37.1 C)  SpO2: 95%    General: Well appearing and comfortable. Alert and oriented. INAD.    Head:  NCAT.  Mild tenderness to right upper supraorbital rim structure. Eyes:  PERRLA. EOMI without pain.  Ears:  No hemotympanums bilaterally Nose:   No septal hematoma.  Tenderness to palpation over nasal bridge.   Neck:   Presents in a  c-collar.  No midline cervical spine tenderness to palpation. Full ROM without difficulty.  CV:  Good peripheral perfusion. RRR.  RESP:  Normal effort. LCTAB. No retractions.  ABD:  No distention. Soft, Non tender.   BACK:  Spinous process is midline without deformity or tenderness. MSK:   Full ROM in all joints. No swelling, deformity or tenderness.  NEURO: Cranial nerves II-XII intact. No focal deficits. Speech clear. Sensation and motor function intact. Normal muscle strength of UE & LE.    ED Results / Procedures / Treatments   Labs (all labs ordered are listed, but only abnormal results are displayed) Labs Reviewed - No data to display  RADIOLOGY  I personally viewed and evaluated these images as part of my medical decision making, as well as reviewing the written report by the radiologist.  CT Cervical Spine Wo Contrast Result Date: 01/01/2025 EXAM: CT CERVICAL SPINE WITHOUT CONTRAST 01/01/2025 11:31:28 AM TECHNIQUE: CT of the cervical spine was performed without the administration of intravenous contrast.  Multiplanar reformatted images are provided for review. Automated exposure control, iterative reconstruction, and/or weight based adjustment of the mA/kV was utilized to reduce the radiation dose to as low as reasonably achievable. COMPARISON: CT cervical spine 07/13/2017. CLINICAL HISTORY: Fall, neck pain, head trauma. History of metastatic breast cancer. FINDINGS: BONES AND ALIGNMENT: No acute fracture or traumatic malalignment. DEGENERATIVE CHANGES: Mild cervical disc degeneration, greatest at C5-C6 where there is likely mild spinal stenosis and mild to moderate right neural foraminal stenosis. Moderately advanced mid and upper cervical facet arthrosis. SOFT TISSUES: No prevertebral soft tissue swelling. LUNGS: Mild biapical lung scarring and emphysema. IMPRESSION: 1. No acute cervical spine fracture no traumatic malalignment. Electronically signed by: Dasie Hamburg MD 01/01/2025 11:55 AM  EST RP Workstation: [id number]   CT Head Wo Contrast Result Date: 01/01/2025 EXAM: CT HEAD WITHOUT CONTRAST 01/01/2025 11:31:28 AM TECHNIQUE: CT of the head was performed without the administration of intravenous contrast. Automated exposure control, iterative reconstruction, and/or weight based adjustment of the mA/kV was utilized to reduce the radiation dose to as low as reasonably achievable. COMPARISON: Head CT 07/13/2017 and outside head MRI 01/10/2023. CLINICAL HISTORY: Fall hit head. Neck pain. FINDINGS: BRAIN AND VENTRICLES: There is no evidence of an acute infarct, intracranial hemorrhage, mass, midline shift, hydrocephalus, or extra-axial fluid collection. A moderate sized region of encephalomalacia in the right frontal lobes adjacent to a craniotomy and a smaller region of encephalomalacia in the left temporoparietal region are unchanged from the prior MRI. Mild hypodensities elsewhere in the cerebral white matter bilaterally are nonspecific but compatible with chronic small vessel ischemic disease. Calcified atherosclerosis at the skull base. ORBITS: No acute abnormality. SINUSES: No acute abnormality. SOFT TISSUES AND SKULL: Right frontal craniotomy. New displaced bilateral nasal bone fractures. Right sided forehead scalp soft tissue swelling. IMPRESSION: 1. No acute intracranial abnormality. 2. Displaced bilateral nasal bone fractures with forehead soft tissue swelling. Electronically signed by: Dasie Hamburg MD 01/01/2025 11:49 AM EST RP Workstation: [id number]    PROCEDURES:  Critical Care performed: No  Procedures   MEDICATIONS ORDERED IN ED: Medications - No data to display   IMPRESSION / MDM / ASSESSMENT AND PLAN / ED COURSE  I reviewed the triage vital signs and the nursing notes.                              Clinical Course as of 01/01/25 1750  Wed Jan 01, 2025  1319 CT Head Wo Contrast    [MH]    Clinical Course User Index [MH] Margrette Rebbeca LABOR, PA-C    69 y.o.  female presents to the emergency department for evaluation and treatment of mechanical fall. See HPI for further details.   Differential diagnosis includes, but is not limited to fracture, ICH, hematoma, ligamentous injury  Patient's presentation is most consistent with acute complicated illness / injury requiring diagnostic workup.  Patient is alert and oriented.  She is hemodynamically stable.  On initial assessment patient is well-appearing and in no acute distress.  C-collar is in place.  Physical exam findings are stated above.  No red flag signs.  Normal neuroexam.  Head CT and cervical spine CT obtained in triage and shows a displaced bilateral nasal bone fracture.  There is no septal hematoma present.  Will refer to ENT for further evaluation.  Advised to call tomorrow to schedule a follow-up appointment.  Pain medication was offered however patient declined.  I do believe she is in  stable condition for discharge home.  Strict ED return precautions were discussed. All questions and concerns were addressed during this ED visit.     FINAL CLINICAL IMPRESSION(S) / ED DIAGNOSES   Final diagnoses:  Fall, initial encounter  Contusion of face, initial encounter  Closed fracture of nasal bone, initial encounter     Rx / DC Orders   ED Discharge Orders     None        Note:  This document was prepared using Dragon voice recognition software and may include unintentional dictation errors.    Margrette, Mazell Aylesworth A, PA-C 01/01/25 1754    Dorothyann Drivers, MD 01/02/25 1705  "

## 2025-01-01 NOTE — Discharge Instructions (Addendum)
 You were evaluated in the ED for a fall at home.  Your head CT is normal with the exception of displaced bilateral nasal bone fractures with forehead soft tissue swelling.  Your cervical neck CT is normal.  Apply ice over the affected area to help with swelling.  Follow-up with ENT for further evaluation of nasal fracture.  If any new or worsening symptoms occur return to ED for further evaluation.

## 2025-01-01 NOTE — ED Triage Notes (Signed)
 Pt reports she woke up during the night on the wrong side of the bed. When she began walking she tripped and fell. Pt reports hitting her head on her dresser. No thinners.

## 2025-01-03 ENCOUNTER — Encounter: Admit: 2025-01-03 | Discharge: 2025-01-04 | Payer: Medicaid (Managed Care)

## 2025-01-04 DIAGNOSIS — C50919 Malignant neoplasm of unspecified site of unspecified female breast: Principal | ICD-10-CM

## 2025-01-04 DIAGNOSIS — C50811 Malignant neoplasm of overlapping sites of right female breast: Principal | ICD-10-CM

## 2025-01-15 ENCOUNTER — Other Ambulatory Visit: Payer: Self-pay | Admitting: Family Medicine

## 2025-01-16 NOTE — Telephone Encounter (Signed)
 Requested Prescriptions  Pending Prescriptions Disp Refills   albuterol  (VENTOLIN  HFA) 108 (90 Base) MCG/ACT inhaler [Pharmacy Med Name: ALBUTEROL  HFA 90 MCG INHALER] 8.5 g 2    Sig: Inhale 2 puffs into the lungs every 6 (six) hours as needed for wheezing or shortness of breath.     Pulmonology:  Beta Agonists 2 Passed - 01/16/2025  8:58 AM      Passed - Last BP in normal range    BP Readings from Last 1 Encounters:  01/01/25 121/66         Passed - Last Heart Rate in normal range    Pulse Readings from Last 1 Encounters:  01/01/25 82         Passed - Valid encounter within last 12 months    Recent Outpatient Visits           2 months ago Hospice care patient   [facility] [place], Megan P, DO   6 months ago Peripheral edema   [place] [facility] Melvin Pao, NP   7 months ago Peripheral edema   [facility] [place], Megan P, DO   10 months ago Encounter for [employer] annual wellness exam    [facility] [place], [place], [place]

## 2025-01-30 ENCOUNTER — Encounter: Payer: Self-pay | Admitting: Family Medicine

## 2025-01-30 ENCOUNTER — Ambulatory Visit: Admitting: Family Medicine

## 2025-01-30 VITALS — BP 133/84 | HR 75 | Temp 97.6°F | Ht 61.0 in | Wt 97.0 lb

## 2025-01-30 DIAGNOSIS — C50811 Malignant neoplasm of overlapping sites of right female breast: Secondary | ICD-10-CM

## 2025-01-30 DIAGNOSIS — C50919 Malignant neoplasm of unspecified site of unspecified female breast: Principal | ICD-10-CM

## 2025-01-30 DIAGNOSIS — C7801 Secondary malignant neoplasm of right lung: Secondary | ICD-10-CM

## 2025-01-30 DIAGNOSIS — C50911 Malignant neoplasm of unspecified site of right female breast: Secondary | ICD-10-CM

## 2025-01-30 DIAGNOSIS — I10 Essential (primary) hypertension: Secondary | ICD-10-CM

## 2025-01-30 DIAGNOSIS — J449 Chronic obstructive pulmonary disease, unspecified: Secondary | ICD-10-CM | POA: Insufficient documentation

## 2025-01-30 DIAGNOSIS — Z515 Encounter for palliative care: Secondary | ICD-10-CM

## 2025-01-30 DIAGNOSIS — E039 Hypothyroidism, unspecified: Secondary | ICD-10-CM

## 2025-01-30 DIAGNOSIS — F419 Anxiety disorder, unspecified: Secondary | ICD-10-CM

## 2025-01-30 DIAGNOSIS — F332 Major depressive disorder, recurrent severe without psychotic features: Secondary | ICD-10-CM

## 2025-01-30 DIAGNOSIS — E782 Mixed hyperlipidemia: Secondary | ICD-10-CM

## 2025-01-30 NOTE — Progress Notes (Unsigned)
 "  BP 133/84   Pulse 75   Temp 97.6 F (36.4 C) (Oral)   Ht 5' 1 (1.549 m)   Wt 97 lb (44 kg)   SpO2 92%   BMI 18.33 kg/m    Subjective:    Patient ID: Barbara Malone, female    DOB: 02/13/1956, 69 y.o.   MRN: [id number]  HPI: Barbara Malone is a 69 y.o. female  Chief Complaint  Patient presents with   Fall    1/7. Onto face. Broke nose. Still having difficulty breathing some. Did go to the ED.    Hand Pain    Hand cramps both hands. LROM   Fell in January and broke her nose. She did follow up with ENT. She refused surgery. Was told to massage her nose. Had bronchitis around the same time and that made her feel worse.   She notes that her hands are getting worse. She does have hospice coming out- but she is only allowing the nurse to come out. She notes that she is not able to open her hands the way normally. She is not able to do her ADLs at home anymore. She feels like she needs additional hours. She notes that she doesn't usually get out of bed at night, but she did in January- she fell and that's when she broke her nose.   She has not seen her oncologist. She would like to see them again. She notes that she is not eating because it hurts her to stand in the kitchen.   HYPERTENSION / HYPERLIPIDEMIA Satisfied with current treatment? {Blank single:19197::yes,no} Duration of hypertension: {Blank single:19197::chronic,months,years} BP monitoring frequency: {Blank single:19197::not checking,rarely,daily,weekly,monthly,a few times a day,a few times a week,a few times a month} BP range:  BP medication side effects: {Blank single:19197::yes,no} Past BP meds: {Blank multiple:19196::none,amlodipine,amlodipine/benazepril,atenolol,benazepril,benazepril/HCTZ,bisoprolol (bystolic),carvedilol,chlorthalidone,clonidine,diltiazem,exforge HCT,HCTZ,irbesartan (avapro),labetalol,lisinopril ,lisinopril -HCTZ,losartan  (cozaar),methyldopa,nifedipine,olmesartan (benicar),olmesartan-HCTZ,quinapril,ramipril,spironalactone,tekturna,valsartan,valsartan-HCTZ,verapamil} Duration of hyperlipidemia: {Blank single:19197::chronic,months,years} Cholesterol medication side effects: {Blank single:19197::yes,no} Cholesterol supplements: {Blank multiple:19196::none,fish oil,niacin,red yeast rice} Past cholesterol medications: {Blank multiple:19196::none,atorvastain (lipitor),lovastatin (mevacor),pravastatin (pravachol),rosuvastatin (crestor),simvastatin (zocor),vytorin,fenofibrate (tricor),gemfibrozil,ezetimide (zetia),niaspan,lovaza} Medication compliance: {Blank single:19197::excellent compliance,good compliance,fair compliance,poor compliance} Aspirin: {Blank single:19197::yes,no} Recent stressors: {Blank single:19197::yes,no} Recurrent headaches: {Blank single:19197::yes,no} Visual changes: {Blank single:19197::yes,no} Palpitations: {Blank single:19197::yes,no} Dyspnea: {Blank single:19197::yes,no} Chest pain: {Blank single:19197::yes,no} Lower extremity edema: {Blank single:19197::yes,no} Dizzy/lightheaded: {Blank single:19197::yes,no}  HYPOTHYROIDISM Thyroid  control status:{Blank single:19197::controlled,uncontrolled,better,worse,exacerbated,stable} Satisfied with current treatment? {Blank single:19197::yes,no} Medication side effects: {Blank single:19197::yes,no} Medication compliance: {Blank single:19197::excellent compliance,good compliance,fair compliance,poor compliance} Etiology of hypothyroidism:  Recent dose adjustment:{Blank single:19197::yes,no} Fatigue: {Blank single:19197::yes,no} Cold intolerance: {Blank single:19197::yes,no} Heat intolerance: {Blank single:19197::yes,no} Weight gain: {Blank single:19197::yes,no} Weight loss: {Blank  single:19197::yes,no} Constipation: {Blank single:19197::yes,no} Diarrhea/loose stools: {Blank single:19197::yes,no} Palpitations: {Blank single:19197::yes,no} Lower extremity edema: {Blank single:19197::yes,no} Anxiety/depressed mood: {Blank single:19197::yes,no}    Relevant past medical, surgical, family and social history reviewed and updated as indicated. Interim medical history since our last visit reviewed. Allergies and medications reviewed and updated.  Review of Systems  Per HPI unless specifically indicated above     Objective:    BP 133/84   Pulse 75   Temp 97.6 F (36.4 C) (Oral)   Ht 5' 1 (1.549 m)   Wt 97 lb (44 kg)   SpO2 92%   BMI 18.33 kg/m   Wt Readings from Last 3 Encounters:  01/30/25 97 lb (44 kg)  01/01/25 100 lb (45.4 kg)  10/28/24 100 lb (45.4 kg)    Physical Exam  Results for orders placed or performed in visit on 06/13/24  CBC and differential   Collection Time: 05/24/24 12:00 AM  Result Value Ref Range   Hemoglobin 13.2 12.0 - 16.0   HCT 38 36 - 46   Platelets 93 (A) 150 - 400 K/uL   WBC 4.2   Basic metabolic panel with GFR   Collection Time: 05/24/24 12:00 AM  Result Value Ref Range   Glucose 111    BUN 5 4 - 21   CO2 24 (A) 13 - 22   Creatinine 0.5 0.5 - 1.1   Potassium 4.6 3.5 - 5.1 mEq/L   Sodium 145 137 - 147   Chloride 111 (A) 99 - 108  Comprehensive metabolic panel with GFR   Collection Time: 05/24/24 12:00 AM  Result Value Ref Range   Calcium 8.1 (A) 8.7 - 10.7   Albumin 2.5 (A) 3.5 - 5.0  Hepatic function panel   Collection Time: 05/24/24 12:00 AM  Result Value Ref Range   Alkaline Phosphatase 326 (A) 25 - 125   ALT 41 (A) 7 - 35 U/L   AST 88 (A) 13 - 35   Bilirubin, Total 0.9   TSH   Collection Time: 05/24/24 12:00 AM  Result Value Ref Range   TSH 2.78 0.41 - 5.90      Assessment & Plan:   Problem List Items Addressed This Visit       Cardiovascular and Mediastinum   Hypertension  - Primary     Respiratory   Malignant neoplasm metastatic to both lungs [facility])     Endocrine   Acquired hypothyroidism     Nervous and Auditory   Cancer of right breast metastatic to brain [facility])     Other   Anxiety   Recurrent major depression-severe (HCC)   Mixed hyperlipidemia   Hypomagnesemia     Follow up plan: No follow-ups on file.      "
